# Patient Record
Sex: Male | Born: 1940 | Race: White | Hispanic: No | Marital: Married | Smoking: Former smoker
Health system: Southern US, Community
[De-identification: ages and names within clinical notes are randomized; demographics above are authoritative.]

## PROBLEM LIST (undated history)

## (undated) ENCOUNTER — Emergency Department (HOSPITAL_COMMUNITY): Admission: EM | Payer: Medicare Other

## (undated) DIAGNOSIS — T8859XA Other complications of anesthesia, initial encounter: Secondary | ICD-10-CM

## (undated) DIAGNOSIS — A4101 Sepsis due to Methicillin susceptible Staphylococcus aureus: Secondary | ICD-10-CM

## (undated) DIAGNOSIS — N35919 Unspecified urethral stricture, male, unspecified site: Secondary | ICD-10-CM

## (undated) DIAGNOSIS — G252 Other specified forms of tremor: Secondary | ICD-10-CM

## (undated) DIAGNOSIS — I480 Paroxysmal atrial fibrillation: Secondary | ICD-10-CM

## (undated) DIAGNOSIS — E785 Hyperlipidemia, unspecified: Secondary | ICD-10-CM

## (undated) DIAGNOSIS — G8220 Paraplegia, unspecified: Secondary | ICD-10-CM

## (undated) DIAGNOSIS — M545 Low back pain, unspecified: Secondary | ICD-10-CM

## (undated) DIAGNOSIS — K2100 Gastro-esophageal reflux disease with esophagitis, without bleeding: Secondary | ICD-10-CM

## (undated) DIAGNOSIS — E559 Vitamin D deficiency, unspecified: Secondary | ICD-10-CM

## (undated) DIAGNOSIS — B029 Zoster without complications: Secondary | ICD-10-CM

## (undated) DIAGNOSIS — G4700 Insomnia, unspecified: Secondary | ICD-10-CM

## (undated) DIAGNOSIS — R918 Other nonspecific abnormal finding of lung field: Secondary | ICD-10-CM

## (undated) DIAGNOSIS — Z8719 Personal history of other diseases of the digestive system: Secondary | ICD-10-CM

## (undated) DIAGNOSIS — I639 Cerebral infarction, unspecified: Secondary | ICD-10-CM

## (undated) DIAGNOSIS — G459 Transient cerebral ischemic attack, unspecified: Secondary | ICD-10-CM

## (undated) DIAGNOSIS — J189 Pneumonia, unspecified organism: Secondary | ICD-10-CM

## (undated) DIAGNOSIS — G250 Essential tremor: Secondary | ICD-10-CM

## (undated) DIAGNOSIS — T4145XA Adverse effect of unspecified anesthetic, initial encounter: Secondary | ICD-10-CM

## (undated) DIAGNOSIS — E119 Type 2 diabetes mellitus without complications: Secondary | ICD-10-CM

## (undated) DIAGNOSIS — N138 Other obstructive and reflux uropathy: Secondary | ICD-10-CM

## (undated) DIAGNOSIS — R350 Frequency of micturition: Secondary | ICD-10-CM

## (undated) DIAGNOSIS — I739 Peripheral vascular disease, unspecified: Secondary | ICD-10-CM

## (undated) DIAGNOSIS — I779 Disorder of arteries and arterioles, unspecified: Secondary | ICD-10-CM

## (undated) DIAGNOSIS — I10 Essential (primary) hypertension: Secondary | ICD-10-CM

## (undated) DIAGNOSIS — R001 Bradycardia, unspecified: Secondary | ICD-10-CM

## (undated) DIAGNOSIS — N401 Enlarged prostate with lower urinary tract symptoms: Secondary | ICD-10-CM

## (undated) DIAGNOSIS — F411 Generalized anxiety disorder: Secondary | ICD-10-CM

## (undated) DIAGNOSIS — J302 Other seasonal allergic rhinitis: Secondary | ICD-10-CM

## (undated) HISTORY — DX: Generalized anxiety disorder: F41.1

## (undated) HISTORY — DX: Type 2 diabetes mellitus without complications: E11.9

## (undated) HISTORY — DX: Unspecified urethral stricture, male, unspecified site: N35.919

## (undated) HISTORY — DX: Insomnia, unspecified: G47.00

## (undated) HISTORY — DX: Vitamin D deficiency, unspecified: E55.9

## (undated) HISTORY — DX: Essential tremor: G25.0

## (undated) HISTORY — DX: Cerebral infarction, unspecified: I63.9

## (undated) HISTORY — DX: Other specified forms of tremor: G25.2

## (undated) HISTORY — PX: SPINAL FUSION: SHX223

## (undated) HISTORY — DX: Hyperlipidemia, unspecified: E78.5

## (undated) HISTORY — DX: Frequency of micturition: R35.0

## (undated) HISTORY — DX: Gastro-esophageal reflux disease with esophagitis, without bleeding: K21.00

## (undated) HISTORY — PX: OTHER SURGICAL HISTORY: SHX169

## (undated) HISTORY — DX: Zoster without complications: B02.9

## (undated) HISTORY — DX: Low back pain: M54.5

## (undated) HISTORY — DX: Benign prostatic hyperplasia with lower urinary tract symptoms: N40.1

## (undated) HISTORY — DX: Other obstructive and reflux uropathy: N13.8

## (undated) HISTORY — DX: Low back pain, unspecified: M54.50

## (undated) HISTORY — DX: Gastro-esophageal reflux disease with esophagitis: K21.0

## (undated) SURGERY — ECHOCARDIOGRAM, TRANSESOPHAGEAL
Anesthesia: Moderate Sedation

---

## 1984-03-02 HISTORY — PX: CARDIAC CATHETERIZATION: SHX172

## 1992-03-02 HISTORY — PX: OTHER SURGICAL HISTORY: SHX169

## 2007-05-24 ENCOUNTER — Emergency Department (HOSPITAL_COMMUNITY): Admission: EM | Admit: 2007-05-24 | Discharge: 2007-05-25 | Payer: Self-pay | Admitting: Emergency Medicine

## 2007-06-01 ENCOUNTER — Ambulatory Visit (HOSPITAL_BASED_OUTPATIENT_CLINIC_OR_DEPARTMENT_OTHER): Admission: RE | Admit: 2007-06-01 | Discharge: 2007-06-01 | Payer: Self-pay | Admitting: Urology

## 2007-07-01 HISTORY — PX: OTHER SURGICAL HISTORY: SHX169

## 2009-02-06 ENCOUNTER — Ambulatory Visit (HOSPITAL_BASED_OUTPATIENT_CLINIC_OR_DEPARTMENT_OTHER): Admission: RE | Admit: 2009-02-06 | Discharge: 2009-02-06 | Payer: Self-pay | Admitting: Urology

## 2009-04-30 ENCOUNTER — Emergency Department (HOSPITAL_COMMUNITY): Admission: EM | Admit: 2009-04-30 | Discharge: 2009-04-30 | Payer: Self-pay | Admitting: Emergency Medicine

## 2010-06-03 LAB — POCT HEMOGLOBIN-HEMACUE: Hemoglobin: 15.7 g/dL (ref 13.0–17.0)

## 2010-07-15 NOTE — Consult Note (Signed)
NAME:  Marcus [name], SEK NO.:  [id number]   MEDICAL RECORD NO.:  [id number]          PATIENT TYPE:  EMS   LOCATION:  ED                           FACILITY:  [facility]   PHYSICIAN:  Valetta Fuller, M.D.  DATE OF BIRTH:  10/14/1940   DATE OF CONSULTATION:  05/25/2007  DATE OF DISCHARGE:                                 CONSULTATION   REASON FOR CONSULTATION:  Urinary retention and inability to place Foley  catheter.   HISTORY OF PRESENT ILLNESS:  Marcus [name] is a 70 year old male.  He  really has no significant urologic history and generally has had good  health.  His primary medical problem has been very severe and chronic  back problems which necessitated a multitude of surgeries.  During those  procedures, he has had Foley catheters placed.  The patient tells me  that he really has not had any appreciable voiding difficulties.  He  typically has nocturia of two times per evening.  He reports a  relatively good stream without dysuria or hematuria.  He has had no  other substantial voiding complaints.  Today he began noticing that when  he tried to urinate, it was very difficult with a very weak stream and  was only able to get small volumes out.  This persisted for several  hours and eventually he was unable to void at all.  He presented to the  [facility] Emergency Room.  He was complaining of suprapubic  pressure and inability to void, but otherwise was clinically doing just  fine.  Nursing staff and techs in the ER attempted on several occasions  to place a variety of catheters without success.  For that reason we  were consulted.   PAST MEDICAL HISTORY:  Again notable primarily for chronic  musculoskeletal back problems.  No other systemic diseases.   MEDICATIONS:  The patient's only medication is some Xanax for anxiety.   ALLERGIES/INTOLERANCES:  He has an allergy/intolerance to DEMEROL.   The patient is a nonsmoker.   SURGICAL HISTORY:  Notable for  a multitude of complicated back  procedures.   REVIEW OF SYSTEMS:  The patient's review of systems is positive for  inability to void, negative for any gross hematuria, dysuria, fever,  chills, flank discomfort.  Positive for suprapubic pressure and  discomfort.  Negative for any chest pain, shortness of breath, vision  problems, ENT problems, testicular pain, extremity swelling.   PHYSICAL EXAMINATION:  GENERAL APPEARANCE:  The patient is a well-  developed, well-nourished male.  He was in moderate distress.  He was  awake, alert and oriented x3.  VITAL SIGNS:  He was afebrile with a blood pressure of 106/74, pulse of  83 and a respiratory rate of 20.  NECK:  No masses or JVD.  CHEST:  Normal effort.  ABDOMEN:  Soft and nontender.  The patient did have large well-healed  midline incision without evidence of ventral hernia.  No splenomegaly.  No obvious masses.  GU:  Penis was circumcised without lesions.  The patient did have some  blood in his urethral  meatus.  Scrotum, testes adnexal structures  unremarkable.  No obvious perineal abnormalities.  Normal rectal tone,  1+ prostate.  EXTREMITIES:  Without edema.   PROCEDURE:  Attempt was made to place an 18-French coude catheter.  Resistance was felt near the area of the proximal penile urethra/bulbar  urethra.  Catheter was then removed.  Flexible cystoscopy was performed.  The patient's initial anterior urethra was unremarkable.  When we got to  the area of obstruction, there was clearly evidence of a false passage  from previous catheter attempts.  I was unable to locate a lumen.  I  spent approximately 30 minutes with the flexible cystoscope attempting  to manipulate my way through this area to try to find the opening.  Eventually, we were successful in bypassing this area.  Along the way, I  encountered two calculi that appeared to be lodged within the urethra  and somewhat entwining within this material from the false passage.   I  was able to guide myself into the bladder and then through the flexible  cystoscope I placed a guidewire.  The flexible cystoscope was then  removed.  We did use a dilator x1 of 16-French and found that that went  in easily.  I then placed a Council-tip Foley catheter over the  guidewire and was able to drain 1000 mL of relatively clear urine from  the patient's bladder.  He had marked improvement.  Of note, we did have  to irrigate his bladder before drainage and one small clot was obtained  but again the urine otherwise remained clear.   ASSESSMENT:  Urinary retention.  I suspect he may have had some degree  of urethral stricturing from prior catheterizations.  He probably has  had some bladder calculi that subsequently came into the urethra and  became lodged occluding him and resulted in the urinary retention.  The  catheter attempts resulted in further trauma to the urethra with some  false passages.  The patient will need to have an indwelling catheter  for 10-14 days and probable outpatient surgery to further assess his  urethra and potentially removed any calculi the may still be residing  within the urethra and/or bladder.  We will cover him with Cipro and  also some pain medication.  Foley will be placed to leg bag drainage.           ______________________________  Valetta Fuller, M.D.  Electronically Signed     DSG/MEDQ  D:  05/25/2007  T:  05/25/2007  Job:  [id number]

## 2010-07-15 NOTE — Op Note (Signed)
NAME:  Marcus [name], Marcus [name] NO.:  [id number]   MEDICAL RECORD NO.:  [id number]          PATIENT TYPE:  AMB   LOCATION:  [facility]                         FACILITY:  [facility]   PHYSICIAN:  Valetta Fuller, M.D.  DATE OF BIRTH:  1940/12/10   DATE OF PROCEDURE:  DATE OF DISCHARGE:                               OPERATIVE REPORT   PREOPERATIVE DIAGNOSES:  1. Urethral false passage.  2. Urinary retention.   POSTOPERATIVE DIAGNOSES:  1. Urethral false passage.  2. Urinary retention.   PROCEDURES:  1. Cystourethroscopy.  2. Removal of bladder stones.   ATTENDING PHYSICIAN:  Valetta Fuller, M.D.   RESIDENT PHYSICIAN:  Maudie Flakes, M.D.   ANESTHESIA:  General.   INDICATIONS FOR PROCEDURE:  Marcus [name] is a 70 year old white male who  Dr. Isabel Caprice saw in the emergency department in consultation for urinary  retention and difficult Foley placement.  He required flexible  urethroscopy and at that time multiple traumatic urethral false passages  were noted, as were some urethral stones.  Dr. Isabel Caprice was eventually  able to find the true lumen, cannulate it with the guidewire and place a  catheter over the wire.  He is being brought back to the operating room  today for further evaluation of his urethra.  Informed consent was  reviewed with Mr. Santa, and this did include ureteral dilation versus  DVIU and possible need for postoperative catheter.   PROCEDURE IN DETAIL:  The patient was brought to the operating room and  placed in supine position.  He was correctly identified by his wristband  and an appropriate time-out was taken.  IV antibiotics were  administered.  General anesthesia was delivered.  Once adequately  anesthetized,  he was placed in dorsal lithotomy position with great  care taken to minimize the risk of peripheral neuropathy or compartment  syndrome.  His perineum was prepped and draped sterilely.  We began our  procedure by retracting his foreskin, which is  fairly phimotic.  We then  passed a 22-French rigid cystoscopic sheath with a 12-degree lens and  sterile water as our irrigant.  He had a large false passage at the 12  o'clock position in the bulbar urethra.  This was easily traversed with  the cystoscope.  His prostatic urethra showed bilobar prostatic  hyperplasia and a high median bar.  Upon entering the bladder, clear  urine was identified.  He had some erythematous and edematous changes to  the back wall of his bladder consistent with where his Foley catheter  rested.  There may be evidence of a small bladder diverticulum in this  area as well.  Both ureteral orifices were noted to be in their normal  anatomic position effluxing clear urine.  He did have multiple small  stones in his bladder that were irrigated away and collected.  The rest  of his bladder was normal in appearance.  We then backed the cystoscope  back into the prostatic urethra and multiple small stones were noted to  be in the 12 o'clock position and with gentle manipulation of the  cystoscope,  they were able to be milked out of the prostate.  These were  also removed through the cystoscope sheath under direct vision.  As we  slowly removed the scope in an antegrade fashion, we were able to  appreciate this false passage, which was a widely patent in an antegrade  flow.  There were no strictures noted and subsequently the cystoscope  was removed.  He did have some postoperative bleeding per urethra which  required re-cystoscoping him, and no significant clots were seen.  No  new urethral injury was appreciated, however.  In light of this, we  advanced a sensor-tip guidewire through the cystoscope into his bladder  and placed a 16-French council-tip catheter over this catheter, inflated  with 10 mL of sterile water, and put him to traction, believing that all  of the bleeding is coming from either his prostatic urethra or his  bulbar urethra from our urethroscopy.   Assuming his urine clears, this  will most likely be removed in the recovery room.  This marked the end  of our procedure.  He tolerated the procedure well.  There were no  complications.  Dr. Isabel Caprice was present and participated in all aspects  of the case.  Anesthesia was reversed.  He was awoken and was taken to  the PACU in stable condition.     ______________________________  Maudie Flakes, MD      Valetta Fuller, M.D.  Electronically Signed    DW/MEDQ  D:  06/01/2007  T:  06/01/2007  Job:  [id number]

## 2010-10-20 ENCOUNTER — Ambulatory Visit (HOSPITAL_BASED_OUTPATIENT_CLINIC_OR_DEPARTMENT_OTHER)
Admission: RE | Admit: 2010-10-20 | Discharge: 2010-10-20 | Disposition: A | Discharge status: Home / Self Care | Payer: Medicare Other | Source: Ambulatory Visit | Attending: Urology | Admitting: Urology

## 2010-10-20 DIAGNOSIS — Z01812 Encounter for preprocedural laboratory examination: Secondary | ICD-10-CM | POA: Insufficient documentation

## 2010-10-20 DIAGNOSIS — N35919 Unspecified urethral stricture, male, unspecified site: Secondary | ICD-10-CM | POA: Insufficient documentation

## 2010-10-20 DIAGNOSIS — R339 Retention of urine, unspecified: Secondary | ICD-10-CM | POA: Insufficient documentation

## 2010-10-20 DIAGNOSIS — Z01810 Encounter for preprocedural cardiovascular examination: Secondary | ICD-10-CM | POA: Insufficient documentation

## 2011-03-03 HISTORY — PX: EYE SURGERY: SHX253

## 2012-03-02 HISTORY — PX: EYE SURGERY: SHX253

## 2012-07-18 ENCOUNTER — Other Ambulatory Visit: Payer: Self-pay | Admitting: *Deleted

## 2012-07-18 ENCOUNTER — Other Ambulatory Visit: Payer: Medicare Other

## 2012-07-18 DIAGNOSIS — I10 Essential (primary) hypertension: Secondary | ICD-10-CM

## 2012-07-18 DIAGNOSIS — E559 Vitamin D deficiency, unspecified: Secondary | ICD-10-CM

## 2012-07-18 DIAGNOSIS — E785 Hyperlipidemia, unspecified: Secondary | ICD-10-CM

## 2012-07-18 DIAGNOSIS — IMO0001 Reserved for inherently not codable concepts without codable children: Secondary | ICD-10-CM

## 2012-07-19 ENCOUNTER — Encounter: Payer: Self-pay | Admitting: Geriatric Medicine

## 2012-07-19 LAB — COMPREHENSIVE METABOLIC PANEL
ALT: 28 IU/L (ref 0–44)
AST: 22 IU/L (ref 0–40)
Albumin/Globulin Ratio: 1.7 (ref 1.1–2.5)
BUN/Creatinine Ratio: 16 (ref 10–22)
GFR calc Af Amer: 86 mL/min/{1.73_m2} (ref >59)
GFR calc non Af Amer: 75 mL/min/{1.73_m2} (ref >59)
Potassium: 4.6 mmol/L (ref 3.5–5.2)
Sodium: 139 mmol/L (ref 134–144)
Total Bilirubin: 0.3 mg/dL (ref 0.0–1.2)

## 2012-07-19 LAB — LIPID PANEL: Chol/HDL Ratio: 5.5 ratio units — ABNORMAL HIGH (ref 0.0–5.0)

## 2012-07-19 LAB — HEMOGLOBIN A1C
Est. average glucose Bld gHb Est-mCnc: 123 mg/dL
Hgb A1c MFr Bld: 5.9 % — ABNORMAL HIGH (ref 4.8–5.6)

## 2012-07-19 LAB — PSA: PSA: 0.7 ng/mL (ref 0.0–4.0)

## 2012-07-20 ENCOUNTER — Encounter: Payer: Self-pay | Admitting: Internal Medicine

## 2012-07-20 ENCOUNTER — Ambulatory Visit (INDEPENDENT_AMBULATORY_CARE_PROVIDER_SITE_OTHER): Payer: Medicare Other | Admitting: Internal Medicine

## 2012-07-20 VITALS — BP 126/84 | HR 58 | Temp 97.9°F | Resp 14 | Ht 67.0 in | Wt 202.8 lb

## 2012-07-20 DIAGNOSIS — E559 Vitamin D deficiency, unspecified: Secondary | ICD-10-CM | POA: Insufficient documentation

## 2012-07-20 DIAGNOSIS — R748 Abnormal levels of other serum enzymes: Secondary | ICD-10-CM

## 2012-07-20 DIAGNOSIS — E785 Hyperlipidemia, unspecified: Secondary | ICD-10-CM

## 2012-07-20 DIAGNOSIS — M545 Low back pain: Secondary | ICD-10-CM

## 2012-07-20 DIAGNOSIS — E119 Type 2 diabetes mellitus without complications: Secondary | ICD-10-CM

## 2012-07-20 DIAGNOSIS — G250 Essential tremor: Secondary | ICD-10-CM

## 2012-07-20 NOTE — Patient Instructions (Signed)
Continue current medications. 

## 2012-07-20 NOTE — Progress Notes (Signed)
Date: 07/20/2012  MRN:  [id number] Name:  Marcus [name] Sex:  male Age:  72 y.o. DOB:10-14-1940   [facility] #:   [id number]                    Provider: Murray Hodgkins,, MD  Emergency Contacts: Contact Information   Name Relation Home Work Mobile   [place] Spouse [phone]        Code Status: full  Allergies: Allergies  Allergen Reactions  . Demerol (Meperidine)      Chief Complaint  Patient presents with  . Annual Exam    Complains of cough and no energy     HPI:  Type II or unspecified type diabetes mellitus without mention of complication, not stated as uncontrolled: mild elevations in glucose. Diet controlled.  Lumbago: chronic low back pains. Unchanged.  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  Unspecified vitamin D deficiency: not using Vit D regularly  Elevated alkaline phosphatase level: new finding on recent lab. No associated GI complaints.   Glaucoma; continues under treatment with ophth.  Fatigue: working 50 hours a week at car [employer]. Exhausted when he gets home.    Past Medical History  Diagnosis Date  . Unspecified vitamin D deficiency   . Urethral stricture unspecified   . Hypertrophy of prostate with urinary obstruction and other lower urinary tract symptoms (LUTS)   . Type II or unspecified type diabetes mellitus without mention of complication, not stated as uncontrolled   . Anxiety state, unspecified   . 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 Urinary frequency   . Insomnia, unspecified   . Unspecified glaucoma(365.9)   . 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 Calculus of kidney   . Other and unspecified hyperlipidemia   . Reflux esophagitis   . Lumbago   . Unspecified essential hypertension   . Herpes zoster without mention of complication     Past Surgical History  Procedure Laterality Date  . Spinal fusion  12/91,11/91   obtained bone from left lower leg  . Eye surgery  2014    cataract  . Eye surgery  2013    Retina     Procedures: 1986-Cardiac Cath.:Normal-Dr.Stuckey 1994-Stress thallium:Normal 1995-CT GEX:BMWUXL 1997-IVP:Normal x-rays but indicated blood clots in strainer after voiding 2001-2D Echo:Normal 2008-Back x-ray 07/2007- Stretching bladder- Dr. Isabel Caprice 08/2007- Eye exam   Consultants: Ortho.-Dr.Supple and Dr.Nitka Cardiologist-Dr.Stuckey Neurology-Dr.Love Urologist-Dr.Humphries Urologist- Dr. Hubbard Robinson- Dr. Eudelia Bunch. Harlon Flor   Current Outpatient Prescriptions  Medication Sig Dispense Refill  . Cholecalciferol (VITAMIN D) 2000 UNITS tablet Take 2,000 Units by mouth daily.      Marland Kitchen LORazepam (ATIVAN) 0.5 MG tablet Take 0.5 mg by mouth every 8 (eight) hours. For blood pressure.       No current facility-administered medications for this visit.  also using drops for glaucoma, but he does not know what they are.   Immunization History  Administered Date(s) Administered  . DTaP 04/30/2009  . Influenza-Generic 01/02/2009, 12/01/2011     Diet: regular  History  Substance Use Topics  . Smoking status: Former Games developer  . Smokeless tobacco: Not on file  . Alcohol Use: No    Family History  Problem Relation Age of Onset  . Diabetes Mother   . Cancer Mother   . Hypertension Father   . Stroke Father   . Alcohol abuse Brother      Constitutional: positive for  fatigue Eyes: positive for glaucoma Ears, nose, mouth, throat, and face: positive for missing teeth and poor dentition Respiratory: negative Cardiovascular: negative Gastrointestinal: negative Genitourinary:positive for frequency Integument/breast: negative Hematologic/lymphatic: negative Musculoskeletal:positive for back pain Neurological: negative Behavioral/Psych: some trouble sleeping. History of early morning awakening. Endocrine: history of mild diabetes. Allergic/Immunologic: negative  Vital signs: BP  126/84  Pulse 58  Temp(Src) 97.9 F (36.6 C) (Oral)  Resp 14  Ht 5\' 7"  (1.702 m)  Wt 202 lb 12.8 oz (91.989 kg)  BMI 31.76 kg/m2  BP 126/84  Pulse 58  Temp(Src) 97.9 F (36.6 C) (Oral)  Resp 14  Ht 5\' 7"  (1.702 m)  Wt 202 lb 12.8 oz (91.989 kg)  BMI 31.76 kg/m2  General Appearance:    Alert, cooperative, no distress, appears stated age  Head:    Normocephalic, without obvious abnormality, atraumatic  Eyes:    PERRL, conjunctiva/corneas clear, EOM's intact, fundi    benign, both eyes       Ears:    Normal TM's and external ear canals, both ears  Nose:   Nares normal, septum midline, mucosa normal, no drainage   or sinus tenderness  Throat:   Lips, mucosa, and tongue normal; gums normal. Missing teeth.  Neck:   Supple, symmetrical, trachea midline, no adenopathy;       thyroid:  No enlargement/tenderness/nodules; no carotid   bruit or JVD  Back:     Symmetric, no curvature, ROM normal, no CVA tenderness  Lungs:     Clear to auscultation bilaterally, respirations unlabored  Chest wall:    No tenderness or deformity  Heart:    Regular rate and rhythm, S1 and S2 normal, no murmur, rub   or gallop  Abdomen:     Soft, non-tender, bowel sounds active all four quadrants,    no masses, no organomegaly  Genitalia:    Normal male without lesion, discharge or tenderness  Rectal:    Normal tone, normal prostate, no masses or tenderness;   guaiac negative stool  Extremities:   Extremities normal, atraumatic, no cyanosis or edema. Scar  In lumbar area from prior surgery.  Pulses:   2+ and symmetric all extremities  Skin:   Skin color, texture, turgor normal, no rashes or lesions. Balding.  Lymph nodes:   Cervical, supraclavicular, and axillary nodes normal  Neurologic:   CNII-XII intact. Normal strength, sensation. Absent DTR at patella bilat.      Screening Score  MMS    PHQ2 0  PHQ9     Fall Risk    BIMS    Lab reports 06/30/2010 CBC Wbc 3.2 CMP: glucose 99, BUN 26,  Creatinine 1.00 Lipid: cholesterol 173, triglycerides 72, HDL 32, LDL 127 PSA 0.8 TSH 1.950 Urine 2+ Wbc  Vitamin D 36.9 06/30/2011 CBC Wbc 3.8 Rbc 4.75 Hemoglobin 14.9  CMP Glucose 104 Bun 20 Creatine 0.95  Lipid Panel Cholesterol 199 Triglycerides 72 Hdl 36 Ldl 149  Vitamin D 25 Hydroxy 27.4  07/15/11 EKG: rate 59. NSR. Normal Appointment on 07/18/2012  Component Date Value Range Status  . Glucose 07/18/2012 108* 65 - 99 mg/dL Final  . BUN 16/12/9602 16  8 - 27 mg/dL Final  . Creatinine, Ser 07/18/2012 1.02  0.76 - 1.27 mg/dL Final  . GFR calc non Af Amer 07/18/2012 75  >59 mL/min/1.73 Final  . GFR calc Af Amer 07/18/2012 86  >59 mL/min/1.73 Final  . BUN/Creatinine Ratio 07/18/2012 16  10 - 22 Final  . Sodium 07/18/2012 139  134 - 144 mmol/L Final  . Potassium 07/18/2012 4.6  3.5 - 5.2 mmol/L Final  . Chloride 07/18/2012 102  97 - 108 mmol/L Final  . CO2 07/18/2012 25  19 - 28 mmol/L Final  . Calcium 07/18/2012 9.1  8.6 - 10.2 mg/dL Final  . Total Protein 07/18/2012 6.4  6.0 - 8.5 g/dL Final  . Albumin 14/78/2956 4.0  3.6 - 4.8 g/dL Final  . Globulin, Total 07/18/2012 2.4  1.5 - 4.5 g/dL Final  . Albumin/Globulin Ratio 07/18/2012 1.7  1.1 - 2.5 Final  . Total Bilirubin 07/18/2012 0.3  0.0 - 1.2 mg/dL Final  . Alkaline Phosphatase 07/18/2012 130* 39 - 117 IU/L Final  . AST 07/18/2012 22  0 - 40 IU/L Final  . ALT 07/18/2012 28  0 - 44 IU/L Final  . Cholesterol, Total 07/18/2012 176  100 - 199 mg/dL Final  . Triglycerides 07/18/2012 103  0 - 149 mg/dL Final  . HDL 21/30/8657 32* >39 mg/dL Final   Comment: According to ATP-III Guidelines, HDL-C >59 mg/dL is considered a                          negative risk factor for CHD.  Marland Kitchen VLDL Cholesterol Cal 07/18/2012 21  5 - 40 mg/dL Final  . LDL Calculated 07/18/2012 846* 0 - 99 mg/dL Final  . Chol/HDL Ratio 07/18/2012 5.5* 0.0 - 5.0 ratio units Final  . Hemoglobin A1C 07/18/2012 5.9* 4.8 - 5.6 % Final   Comment:          Increased risk  for diabetes: 5.7 - 6.4                                   Diabetes: >6.4                                   Glycemic control for adults with diabetes: <7.0  . Estimated average glucose 07/18/2012 123   Final  . Vit D, 25-Hydroxy 07/18/2012 27.6* 30.0 - 100.0 ng/mL Final   Comment: Vitamin D deficiency has been defined by the Institute of                          Medicine and an Endocrine Society practice guideline as a                          level of serum 25-OH vitamin D less than 20 ng/mL (1,2).                          The Endocrine Society went on to further define vitamin D                          insufficiency as a level between 21 and 29 ng/mL (2).                          1. IOM (Institute of Medicine). 2010. Dietary reference                             intakes for calcium and D. [place]  DC: The                             [employer].                          2. Holick MF, Binkley [place], Bischoff-Ferrari HA, et al.                             Evaluation, treatment, and prevention of vitamin D                             deficiency: an Endocrine Society clinical practice                             guideline. JCEM. 2011 Jul; 96(7):1911-30.  Marland Kitchen PSA 07/18/2012 0.7  0.0 - 4.0 ng/mL Final   Comment: Roche ECLIA methodology.                          According to the American Urological Association, Serum PSA should                          decrease and remain at undetectable levels after radical                          prostatectomy. The AUA defines biochemical recurrence as an initial                          PSA value 0.2 ng/mL or greater followed by a subsequent confirmatory                          PSA value 0.2 ng/mL or greater.                          Values obtained with different assay methods or kits cannot be used                          interchangeably. Results cannot be interpreted as absolute evidence                          of the presence or absence of  malignant disease.     Annual summary: Hospitalizations: none in the last year Infection History: bronchitis in march 2014 Functional assessment: independent in all ADL Areas of potential improvement: none Rehabilitation Potential: not pertinent Prognosis for survival: good  Plan: 1. Lumbago Chronic and unchanged  2. Other and unspecified hyperlipidemia Follow lab at future visit - Lipid Panel; Future  3. 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 Type II or unspecified type diabetes mellitus without mention of complication, not stated as uncontrolled Mild and diet controlled - CMP; Future - Hemoglobin A1c; Future  5. Unspecified vitamin D deficiency Take vitamin D as recommended  6. Elevated alkaline phosphatase level Followup lab next visit

## 2012-10-12 ENCOUNTER — Other Ambulatory Visit: Payer: Self-pay | Admitting: Geriatric Medicine

## 2012-10-12 MED ORDER — LORAZEPAM 0.5 MG PO TABS: 0.5000 mg | ORAL_TABLET | Freq: Three times a day (TID) | ORAL | Status: DC

## 2012-10-25 ENCOUNTER — Encounter: Payer: Self-pay | Admitting: *Deleted

## 2013-01-12 ENCOUNTER — Other Ambulatory Visit: Payer: Medicare Other

## 2013-01-12 DIAGNOSIS — E119 Type 2 diabetes mellitus without complications: Secondary | ICD-10-CM

## 2013-01-12 DIAGNOSIS — E785 Hyperlipidemia, unspecified: Secondary | ICD-10-CM

## 2013-01-13 LAB — COMPREHENSIVE METABOLIC PANEL
ALT: 17 IU/L (ref 0–44)
AST: 21 IU/L (ref 0–40)
Chloride: 105 mmol/L (ref 97–108)
GFR calc Af Amer: 72 mL/min/{1.73_m2} (ref >59)
Glucose: 111 mg/dL — ABNORMAL HIGH (ref 65–99)
Potassium: 4.9 mmol/L (ref 3.5–5.2)
Sodium: 143 mmol/L (ref 134–144)
Total Bilirubin: 0.3 mg/dL (ref 0.0–1.2)
Total Protein: 6.2 g/dL (ref 6.0–8.5)

## 2013-01-13 LAB — LIPID PANEL
Cholesterol, Total: 171 mg/dL (ref 100–199)
VLDL Cholesterol Cal: 14 mg/dL (ref 5–40)

## 2013-01-13 LAB — HEMOGLOBIN A1C
Est. average glucose Bld gHb Est-mCnc: 123 mg/dL
Hgb A1c MFr Bld: 5.9 % — ABNORMAL HIGH (ref 4.8–5.6)

## 2013-01-16 ENCOUNTER — Encounter: Payer: Self-pay | Admitting: *Deleted

## 2013-01-17 ENCOUNTER — Ambulatory Visit: Payer: Self-pay | Admitting: Internal Medicine

## 2013-02-01 ENCOUNTER — Encounter: Payer: Self-pay | Admitting: Internal Medicine

## 2013-02-01 ENCOUNTER — Ambulatory Visit (INDEPENDENT_AMBULATORY_CARE_PROVIDER_SITE_OTHER): Payer: Medicare Other | Admitting: Internal Medicine

## 2013-02-01 VITALS — BP 124/86 | HR 73 | Temp 98.1°F | Resp 16 | Wt 201.2 lb

## 2013-02-01 DIAGNOSIS — E119 Type 2 diabetes mellitus without complications: Secondary | ICD-10-CM

## 2013-02-01 DIAGNOSIS — E785 Hyperlipidemia, unspecified: Secondary | ICD-10-CM

## 2013-02-01 DIAGNOSIS — M545 Low back pain: Secondary | ICD-10-CM

## 2013-02-01 DIAGNOSIS — G250 Essential tremor: Secondary | ICD-10-CM

## 2013-02-01 DIAGNOSIS — Z23 Encounter for immunization: Secondary | ICD-10-CM

## 2013-02-01 NOTE — Progress Notes (Signed)
Subjective:    Patient ID: Marcus [name], male    DOB: Sep 07, 1940, 72 y.o.   MRN: [id number]  Chief Complaint  Patient presents with  . Medical Managment of Chronic Issues    6 month f/u   . Immunizations    pt wants to discuss getting the Pneumo and Zoster before agreeing.    HPI Type II or unspecified type diabetes mellitus without mention of complication, not stated as uncontrolled: most recent lab continues to show mild elevation in glucose  Other and unspecified hyperlipidemia: well controlled. Low HDL.  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 Cholecalciferol (VITAMIN D) 2000 UNITS tablet Take 2,000 Units by mouth daily.      Marland Kitchen LORazepam (ATIVAN) 0.5 MG tablet Take 1 tablet (0.5 mg total) by mouth every 8 (eight) hours. For blood pressure.  90 tablet  3   No current facility-administered medications on file prior to visit.    Review of Systems  Constitutional: Negative for fever, activity change, appetite change, fatigue and unexpected weight change.  Eyes:       Hx glaucoma  Respiratory: Negative.   Cardiovascular: Negative.  Negative for chest pain, palpitations and leg swelling.  Gastrointestinal: Positive for constipation. Negative for abdominal pain, diarrhea and abdominal distention.  Endocrine: Negative for polydipsia, polyphagia and polyuria.       History of mild elevations in glucose  Genitourinary: Positive for frequency.  Musculoskeletal: Positive for back pain.  Skin: Negative.   Allergic/Immunologic: Negative.   Neurological: Negative.   Hematological: Negative.     Psychiatric/Behavioral: Negative.        Objective:BP 124/86  Pulse 73  Temp(Src) 98.1 F (36.7 C) (Oral)  Resp 16  Wt 201 lb 3.2 oz (91.264 kg)  SpO2 95%    Physical Exam  Constitutional: He is oriented to person, place, and time. He appears well-developed and well-nourished. No distress.  HENT:  Head: Atraumatic.  Right Ear: External ear normal.  Left Ear: External ear normal.  Nose: Nose normal.  Mouth/Throat: No oropharyngeal exudate.  Eyes: Conjunctivae and EOM are normal. Pupils are equal, round, and reactive to light.  Neck: No JVD present. No tracheal deviation present. No thyromegaly present.  Cardiovascular: Normal rate, regular rhythm, normal heart sounds and intact distal pulses.  Exam reveals no gallop and no friction rub.   No murmur heard. Pulmonary/Chest: No respiratory distress. He has no wheezes. He has no rales.  Abdominal: He exhibits no distension and no mass. There is no tenderness.  Musculoskeletal: Normal range of motion. He exhibits no edema and no tenderness.  Lymphadenopathy:    He has no cervical adenopathy.  Neurological: He is alert and oriented to person, place, and time. He displays abnormal reflex. No cranial nerve deficit. Coordination normal.  Absent patellar deep tendon reflex bilaterally.  Skin: No rash noted. No erythema. No pallor.  Psychiatric: He has a normal mood and affect. His behavior is normal. Judgment and thought content normal.      Appointment on 01/12/2013  Component Date Value Range Status  . Glucose 01/12/2013 111* 65 - 99 mg/dL Final  . BUN 78/29/5621 26  8 - 27 mg/dL Final  . Creatinine,  Ser 01/12/2013 1.18  0.76 - 1.27 mg/dL Final  . GFR calc non Af Amer 01/12/2013 63  >59 mL/min/1.73 Final  . GFR calc Af Amer 01/12/2013 72  >59 mL/min/1.73 Final  . BUN/Creatinine Ratio 01/12/2013 22  10 - 22 Final  . Sodium 01/12/2013 143  134 - 144 mmol/L Final  . Potassium 01/12/2013 4.9  3.5 - 5.2 mmol/L Final  . Chloride  01/12/2013 105  97 - 108 mmol/L Final  . CO2 01/12/2013 26  18 - 29 mmol/L Final  . Calcium 01/12/2013 9.3  8.6 - 10.2 mg/dL Final  . Total Protein 01/12/2013 6.2  6.0 - 8.5 g/dL Final  . Albumin 45/40/9811 4.0  3.6 - 4.8 g/dL Final  . Globulin, Total 01/12/2013 2.2  1.5 - 4.5 g/dL Final  . Albumin/Globulin Ratio 01/12/2013 1.8  1.1 - 2.5 Final  . Total Bilirubin 01/12/2013 0.3  0.0 - 1.2 mg/dL Final  . Alkaline Phosphatase 01/12/2013 109  39 - 117 IU/L Final  . AST 01/12/2013 21  0 - 40 IU/L Final  . ALT 01/12/2013 17  0 - 44 IU/L Final  . Cholesterol, Total 01/12/2013 171  100 - 199 mg/dL Final  . Triglycerides 01/12/2013 68  0 - 149 mg/dL Final  . HDL 91/47/8295 33* >39 mg/dL Final   Comment: According to ATP-III Guidelines, HDL-C >59 mg/dL is considered a                          negative risk factor for CHD.  Marland Kitchen VLDL Cholesterol Cal 01/12/2013 14  5 - 40 mg/dL Final  . LDL Calculated 01/12/2013 621* 0 - 99 mg/dL Final  . Chol/HDL Ratio 01/12/2013 5.2* 0.0 - 5.0 ratio units Final   Comment:                                   T. Chol/HDL Ratio                                                                      Men  Women                                                        1/2 Avg.Risk  3.4    3.3                                                            Avg.Risk  5.0    4.4  2X Avg.Risk  9.6    7.1                                                         3X Avg.Risk 23.4   11.0  . Hemoglobin A1C 01/12/2013 5.9* 4.8 - 5.6 % Final   Comment:          Increased risk for diabetes: 5.7 - 6.4                                   Diabetes: >6.4                                   Glycemic control for adults with diabetes: <7.0  . Estimated average glucose 01/12/2013 123   Final       Assessment & Plan:  Type II or unspecified type diabetes mellitus without mention of complication, not stated as uncontrolled: Mild elevations in glucose  persists. Dietary control alone at this time.  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

## 2013-03-04 NOTE — Patient Instructions (Signed)
Continue current medications. We recommend pneumonia vaccine. Because of documented shingles in the past, I do not recommend herpes zoster vaccine.

## 2013-03-22 ENCOUNTER — Encounter: Payer: Self-pay | Admitting: Internal Medicine

## 2013-04-30 ENCOUNTER — Other Ambulatory Visit: Payer: Self-pay | Admitting: Nurse Practitioner

## 2013-07-07 ENCOUNTER — Emergency Department (INDEPENDENT_AMBULATORY_CARE_PROVIDER_SITE_OTHER): Payer: Medicare Other

## 2013-07-07 ENCOUNTER — Emergency Department (HOSPITAL_COMMUNITY)
Admission: EM | Admit: 2013-07-07 | Discharge: 2013-07-07 | Disposition: A | Discharge status: Home / Self Care | Payer: Medicare Other | Attending: Emergency Medicine | Admitting: Emergency Medicine

## 2013-07-07 ENCOUNTER — Emergency Department (HOSPITAL_COMMUNITY): Payer: Medicare Other

## 2013-07-07 ENCOUNTER — Emergency Department (HOSPITAL_COMMUNITY)
Admission: EM | Admit: 2013-07-07 | Discharge: 2013-07-07 | Disposition: A | Discharge status: Home / Self Care | Payer: Medicare Other | Source: Home / Self Care | Attending: Family Medicine | Admitting: Family Medicine

## 2013-07-07 ENCOUNTER — Encounter (HOSPITAL_COMMUNITY): Payer: Self-pay | Admitting: Emergency Medicine

## 2013-07-07 DIAGNOSIS — I10 Essential (primary) hypertension: Secondary | ICD-10-CM | POA: Insufficient documentation

## 2013-07-07 DIAGNOSIS — Z87442 Personal history of urinary calculi: Secondary | ICD-10-CM | POA: Insufficient documentation

## 2013-07-07 DIAGNOSIS — J180 Bronchopneumonia, unspecified organism: Secondary | ICD-10-CM

## 2013-07-07 DIAGNOSIS — Z8669 Personal history of other diseases of the nervous system and sense organs: Secondary | ICD-10-CM | POA: Insufficient documentation

## 2013-07-07 DIAGNOSIS — R0602 Shortness of breath: Secondary | ICD-10-CM

## 2013-07-07 DIAGNOSIS — Z8619 Personal history of other infectious and parasitic diseases: Secondary | ICD-10-CM | POA: Insufficient documentation

## 2013-07-07 DIAGNOSIS — R0902 Hypoxemia: Secondary | ICD-10-CM

## 2013-07-07 DIAGNOSIS — E119 Type 2 diabetes mellitus without complications: Secondary | ICD-10-CM | POA: Insufficient documentation

## 2013-07-07 DIAGNOSIS — R791 Abnormal coagulation profile: Secondary | ICD-10-CM

## 2013-07-07 DIAGNOSIS — F411 Generalized anxiety disorder: Secondary | ICD-10-CM | POA: Insufficient documentation

## 2013-07-07 DIAGNOSIS — Z87448 Personal history of other diseases of urinary system: Secondary | ICD-10-CM | POA: Insufficient documentation

## 2013-07-07 DIAGNOSIS — Z87891 Personal history of nicotine dependence: Secondary | ICD-10-CM | POA: Insufficient documentation

## 2013-07-07 DIAGNOSIS — Z9889 Other specified postprocedural states: Secondary | ICD-10-CM | POA: Insufficient documentation

## 2013-07-07 DIAGNOSIS — Z8719 Personal history of other diseases of the digestive system: Secondary | ICD-10-CM | POA: Insufficient documentation

## 2013-07-07 DIAGNOSIS — R7989 Other specified abnormal findings of blood chemistry: Secondary | ICD-10-CM

## 2013-07-07 DIAGNOSIS — Z79899 Other long term (current) drug therapy: Secondary | ICD-10-CM | POA: Insufficient documentation

## 2013-07-07 LAB — CBC WITH DIFFERENTIAL/PLATELET
BASOS ABS: 0 10*3/uL (ref 0.0–0.1)
BASOS PCT: 0 % (ref 0–1)
EOS ABS: 0.1 10*3/uL (ref 0.0–0.7)
EOS PCT: 2 % (ref 0–5)
HCT: 46.5 % (ref 39.0–52.0)
Hemoglobin: 15.6 g/dL (ref 13.0–17.0)
Lymphocytes Relative: 15 % (ref 12–46)
Lymphs Abs: 0.9 10*3/uL (ref 0.7–4.0)
MCH: 32 pg (ref 26.0–34.0)
MCHC: 33.5 g/dL (ref 30.0–36.0)
MCV: 95.5 fL (ref 78.0–100.0)
Monocytes Absolute: 0.7 10*3/uL (ref 0.1–1.0)
Monocytes Relative: 12 % (ref 3–12)
Neutro Abs: 4.4 10*3/uL (ref 1.7–7.7)
Neutrophils Relative %: 71 % (ref 43–77)
PLATELETS: 147 10*3/uL — AB (ref 150–400)
RBC: 4.87 MIL/uL (ref 4.22–5.81)
RDW: 13.4 % (ref 11.5–15.5)
WBC: 6.2 10*3/uL (ref 4.0–10.5)

## 2013-07-07 LAB — POCT I-STAT, CHEM 8
BUN: 14 mg/dL (ref 6–23)
Calcium, Ion: 1.17 mmol/L (ref 1.13–1.30)
Chloride: 99 mEq/L (ref 96–112)
Creatinine, Ser: 1.1 mg/dL (ref 0.50–1.35)
GLUCOSE: 104 mg/dL — AB (ref 70–99)
HCT: 51 % (ref 39.0–52.0)
Hemoglobin: 17.3 g/dL — ABNORMAL HIGH (ref 13.0–17.0)
Potassium: 4.3 mEq/L (ref 3.7–5.3)
Sodium: 139 mEq/L (ref 137–147)
TCO2: 29 mmol/L (ref 0–100)

## 2013-07-07 LAB — I-STAT TROPONIN, ED: TROPONIN I, POC: 0 ng/mL (ref 0.00–0.08)

## 2013-07-07 LAB — D-DIMER, QUANTITATIVE (NOT AT ARMC): D DIMER QUANT: 0.6 ug{FEU}/mL — AB (ref 0.00–0.48)

## 2013-07-07 LAB — PRO B NATRIURETIC PEPTIDE: Pro B Natriuretic peptide (BNP): 442.6 pg/mL — ABNORMAL HIGH (ref 0–125)

## 2013-07-07 MED ORDER — LEVOFLOXACIN 500 MG PO TABS: 500.0000 mg | ORAL_TABLET | Freq: Every day | ORAL | Status: DC

## 2013-07-07 MED ORDER — ALBUTEROL SULFATE HFA 108 (90 BASE) MCG/ACT IN AERS
2.0000 | INHALATION_SPRAY | Freq: Once | RESPIRATORY_TRACT | Status: AC
  Administered 2013-07-07: 2 via RESPIRATORY_TRACT
  Filled 2013-07-07: qty 6.7

## 2013-07-07 MED ORDER — IPRATROPIUM BROMIDE 0.02 % IN SOLN
RESPIRATORY_TRACT | Status: AC
  Filled 2013-07-07: qty 2.5

## 2013-07-07 MED ORDER — PREDNISONE 20 MG PO TABS: 40.0000 mg | ORAL_TABLET | Freq: Every day | ORAL | Status: DC

## 2013-07-07 MED ORDER — IOHEXOL 350 MG/ML SOLN
80.0000 mL | Freq: Once | INTRAVENOUS | Status: AC | PRN
  Administered 2013-07-07: 80 mL via INTRAVENOUS

## 2013-07-07 MED ORDER — ALBUTEROL SULFATE (2.5 MG/3ML) 0.083% IN NEBU
INHALATION_SOLUTION | RESPIRATORY_TRACT | Status: AC
  Filled 2013-07-07: qty 12

## 2013-07-07 MED ORDER — DM-GUAIFENESIN ER 30-600 MG PO TB12: 1.0000 | ORAL_TABLET | Freq: Two times a day (BID) | ORAL | Status: DC

## 2013-07-07 MED ORDER — ALBUTEROL SULFATE (2.5 MG/3ML) 0.083% IN NEBU
INHALATION_SOLUTION | RESPIRATORY_TRACT | Status: AC
  Filled 2013-07-07: qty 3

## 2013-07-07 MED ORDER — AEROCHAMBER PLUS W/MASK MISC
1.0000 | Freq: Once | Status: AC
  Administered 2013-07-07: 1

## 2013-07-07 MED ORDER — ALBUTEROL SULFATE (2.5 MG/3ML) 0.083% IN NEBU
10.0000 mg | INHALATION_SOLUTION | Freq: Once | RESPIRATORY_TRACT | Status: AC
  Administered 2013-07-07: 10 mg via RESPIRATORY_TRACT

## 2013-07-07 MED ORDER — LORAZEPAM 2 MG/ML IJ SOLN
2.0000 mg | Freq: Once | INTRAMUSCULAR | Status: AC
  Administered 2013-07-07: 2 mg via INTRAVENOUS
  Filled 2013-07-07: qty 1

## 2013-07-07 MED ORDER — PREDNISONE 20 MG PO TABS
60.0000 mg | ORAL_TABLET | Freq: Once | ORAL | Status: AC
  Administered 2013-07-07: 60 mg via ORAL
  Filled 2013-07-07: qty 3

## 2013-07-07 MED ORDER — IPRATROPIUM BROMIDE 0.02 % IN SOLN
0.5000 mg | Freq: Once | RESPIRATORY_TRACT | Status: AC
  Administered 2013-07-07: 0.5 mg via RESPIRATORY_TRACT

## 2013-07-07 MED ORDER — ALBUTEROL SULFATE (2.5 MG/3ML) 0.083% IN NEBU
5.0000 mg | INHALATION_SOLUTION | Freq: Once | RESPIRATORY_TRACT | Status: AC
  Administered 2013-07-07: 5 mg via RESPIRATORY_TRACT

## 2013-07-07 MED ORDER — HYDROCODONE-ACETAMINOPHEN 7.5-325 MG/15ML PO SOLN: 15.0000 mL | Freq: Three times a day (TID) | ORAL | Status: DC | PRN

## 2013-07-07 NOTE — ED Notes (Signed)
Pt sent here from [facility] with SOB and congestion for 5 days. sts fever. D Dimer was elevated at [facility]. Sent here r/o PE.

## 2013-07-07 NOTE — ED Notes (Signed)
Patient c/o  He is claustrophobic, and wants to go outside for a few minuets ; stable

## 2013-07-07 NOTE — ED Notes (Signed)
Cough x 1 week; "wife made me come". Audible wheezing

## 2013-07-07 NOTE — ED Provider Notes (Signed)
Patient reports about 10 days of cough with green sputum production off and on. He started feeling like he was having fever and chills about 2 days ago. He also states he has started having wheezing that he's never had before in the past.  On exam when patient breathes hard he can make himself wheeze. However when he takes normal breaths and I listened to his lungs they are clear.  We discussed using an inhaler with a spacer. We also discussed that most, and use to get to go home and that he could expect to have some cough and fatigue for up to 3 months. However he should feel better than he does today but the next week.  Medical screening examination/treatment/procedure(s) were conducted as a shared visit with non-physician practitioner(s) and myself.  I personally evaluated the patient during the encounter.   EKG Interpretation   Date/Time:  Friday Jul 07 2013 11:52:45 EDT Ventricular Rate:  68 PR Interval:  144 QRS Duration: 70 QT Interval:  372 QTC Calculation: 395 R Axis:   42 Text Interpretation:  Normal sinus rhythm Nonspecific ST abnormality  Baseline wander No significant change since last tracing Confirmed by  Murdo  MD-I, Kohle Winner ([id number]) on 07/07/2013 3:28:26 PM      Rolland Porter, MD, Alanson Aly, MD 07/07/13 [phone]

## 2013-07-07 NOTE — Discharge Instructions (Signed)

## 2013-07-07 NOTE — ED Provider Notes (Signed)
CSN: [id number]     Arrival date & time 07/07/13  0915 History   First MD Initiated Contact with Patient 07/07/13 [phone]     Chief Complaint  Patient presents with  . Cough   (Consider location/radiation/quality/duration/timing/severity/associated sxs/prior Treatment) HPI Comments: 73 year old male presents for evaluation of not being able to breathe. For about 5 days, he has had progressively worsening or cough, shortness of breath, and wheezing. He has also had nausea this morning. All of his symptoms have been progressively worsening. She also just feels generally ill with some body aches and fatigue. He has no significant past medical history is relatively healthy with no history of coronary disease or heart failure. He does not smoke and has never smoked. He has a previous diagnosis of diabetes but his A1c is now under 6 without any medications. He denies any pain in his abdomen or chest pain. Denies palpitations or fluttering. No leg swelling.  Patient is a 73 y.o. male presenting with cough.  Cough Associated symptoms: shortness of breath and wheezing   Associated symptoms: no chest pain, no chills, no fever and no rash     Past Medical History  Diagnosis Date  . Unspecified vitamin D deficiency   . Urethral stricture unspecified   . Hypertrophy of prostate with urinary obstruction and other lower urinary tract symptoms (LUTS)   . Type II or unspecified type diabetes mellitus without mention of complication, not stated as uncontrolled   . Anxiety state, unspecified   . 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 Urinary frequency   . Insomnia, unspecified   . Unspecified glaucoma   . 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 Calculus of kidney   . Other and unspecified hyperlipidemia   . Reflux esophagitis   . Lumbago   . Unspecified essential hypertension   . Herpes zoster without mention of complication   . Glaucoma    Past Surgical History  Procedure Laterality Date  .  Spinal fusion  12/91,11/91    obtained bone from left lower leg  . Eye surgery  2014    cataract  . Eye surgery  2013    Retina  . Cardiac catheterization  1986    normal, Dr Lia Foyer  . Stress thallium  1994     normal;  . Stretching bladder  07/2007    Dr Risa Grill   Family History  Problem Relation Age of Onset  . Diabetes Mother   . Cancer Mother   . Hypertension Father   . Stroke Father   . Alcohol abuse Brother    History  Substance Use Topics  . Smoking status: Former Research scientist (life sciences)  . Smokeless tobacco: Not on file  . Alcohol Use: No    Review of Systems  Constitutional: Positive for fatigue. Negative for fever and chills.  Eyes: Negative for visual disturbance.  Respiratory: Positive for cough, chest tightness, shortness of breath and wheezing.   Cardiovascular: Negative for chest pain and leg swelling.  Gastrointestinal: Positive for nausea. Negative for vomiting, abdominal pain and diarrhea.  Endocrine: Negative for polydipsia and polyuria.  Genitourinary: Negative for dysuria and flank pain.  Musculoskeletal: Negative for gait problem.  Skin: Negative for color change and rash.  Neurological: Negative for speech difficulty and light-headedness.  All other systems reviewed and are negative.   Allergies  Demerol  Home Medications   Prior to Admission medications   Medication Sig Start Date End Date Taking? Authorizing Provider  Cholecalciferol (VITAMIN D)  2000 UNITS tablet Take 2,000 Units by mouth daily.    Historical Provider, MD  LORazepam (ATIVAN) 0.5 MG tablet TAKE ONE TABLET BY MOUTH EVERY EIGHT HOURS     Estill Dooms, MD   BP 123/61  Pulse 66  Temp(Src) 100 F (37.8 C)  Resp 14  SpO2 92% Physical Exam  Nursing note and vitals reviewed. Constitutional: He is oriented to person, place, and time. He appears well-developed and well-nourished. No distress.  HENT:  Head: Normocephalic.  Neck: Normal range of motion. Neck supple. No JVD present. No  tracheal deviation present.  Cardiovascular: Normal rate, regular rhythm and normal heart sounds.  Exam reveals no gallop and no friction rub.   No murmur heard. Pulmonary/Chest: No accessory muscle usage. Tachypnea noted. He is in respiratory distress (mild). He has wheezes. He has rhonchi. He has rales.  Abdominal: Soft. He exhibits no distension and no mass. There is no tenderness. There is no rebound and no guarding.  Lymphadenopathy:    He has no cervical adenopathy.  Neurological: He is alert and oriented to person, place, and time. Coordination normal.  Skin: Skin is warm and dry. No rash noted. He is not diaphoretic.  Psychiatric: He has a normal mood and affect. Judgment normal.    ED Course  Procedures (including critical care time) Labs Review Labs Reviewed  CBC WITH DIFFERENTIAL - Abnormal; Notable for the following:    Platelets 147 (*)    All other components within normal limits  D-DIMER, QUANTITATIVE - Abnormal; Notable for the following:    D-Dimer, Quant 0.60 (*)    All other components within normal limits  POCT I-STAT, CHEM 8 - Abnormal; Notable for the following:    Glucose, Bld 104 (*)    Hemoglobin 17.3 (*)    All other components within normal limits  PRO B NATRIURETIC PEPTIDE    Imaging Review Dg Chest 2 View  07/07/2013   CLINICAL DATA:  One week history of fever, cough and chest congestion  EXAM: CHEST  2 VIEW  COMPARISON:  None.  FINDINGS: The lungs are clear and negative for focal airspace consolidation, pulmonary edema or suspicious pulmonary nodule. Mild central bronchitic changes. No pleural effusion or pneumothorax. Cardiac and mediastinal contours are within normal limits. Probable calcified right hilar lymph node. No acute fracture or lytic or blastic osseous lesions. The visualized upper abdominal bowel gas pattern is unremarkable.  IMPRESSION: Mild nonspecific bronchial wall thickening could reflect acute or chronic bronchitis.   Electronically  Signed   By: Jacqulynn Cadet M.D.   On: 07/07/2013 10:33    SaO2 re-checked and was 92%   950: getting CXR, EKG, neb    1040: XR and EKG normal, minimal improvement with duoneb, SaO2 is now 93%.  Will repeat neb with 10 mg albuterol and 0.5 mg atrovent, sending labs   1125: no obvious cause found, labs came back with positive D-dimer   MDM   1. SOB (shortness of breath)   2. Hypoxia   3. Positive D dimer    Patient with cough, shortness of breath, decreased oxygen saturation, elevated d-dimer. Needs to rule out pulmonary embolism. Transferred to the emergency department via shuttle.  Meds ordered this encounter  Medications  . ipratropium (ATROVENT) nebulizer solution 0.5 mg    Sig:   . albuterol (PROVENTIL) (2.5 MG/3ML) 0.083% nebulizer solution 5 mg    Sig:   . albuterol (PROVENTIL) (2.5 MG/3ML) 0.083% nebulizer solution 10 mg    Sig:   .  ipratropium (ATROVENT) nebulizer solution 0.5 mg    Sig:      Liam Graham, PA-C 07/07/13 1128

## 2013-07-07 NOTE — ED Provider Notes (Signed)
CSN: [id number]     Arrival date & time 07/07/13  1148 History   First MD Initiated Contact with Patient 07/07/13 1220     Chief Complaint  Patient presents with  . Shortness of Breath     (Consider location/radiation/quality/duration/timing/severity/associated sxs/prior Treatment) HPI Comments: Patient is a 73 year old male with history of urethral stricture, LUTS, diabetes, glaucoma, hyperlipidemia, and hypertension who presents today for evaluation of shortness of breath. This has been gradually worsening over the past 5 days. He has had wheezing and "bubbling" from his lungs. He has had associated cough. Patient is a former smoker and still wears nicotine patches. He quit 23 years ago. He has associated cough. No hemoptysis. No leg swelling or recent surgeries. No long trips. He was seen at [facility] and given nebulizer treatment x 2. His wife states he sounds improved, but patient reports still feeling short of breath. A d dimer was done which was 0.6 and he was sent here for CT angio of his chest.   The history is provided by the patient. No language interpreter was used.    Past Medical History  Diagnosis Date  . Unspecified vitamin D deficiency   . Urethral stricture unspecified   . Hypertrophy of prostate with urinary obstruction and other lower urinary tract symptoms (LUTS)   . Type II or unspecified type diabetes mellitus without mention of complication, not stated as uncontrolled   . Anxiety state, unspecified   . 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 Urinary frequency   . Insomnia, unspecified   . Unspecified glaucoma   . 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 Calculus of kidney   . Other and unspecified hyperlipidemia   . Reflux esophagitis   . Lumbago   . Unspecified essential hypertension   . Herpes zoster without mention of complication   . Glaucoma    Past Surgical History  Procedure Laterality Date  . Spinal fusion  12/91,11/91    obtained bone from left  lower leg  . Eye surgery  2014    cataract  . Eye surgery  2013    Retina  . Cardiac catheterization  1986    normal, Dr Lia Foyer  . Stress thallium  1994     normal;  . Stretching bladder  07/2007    Dr Risa Grill   Family History  Problem Relation Age of Onset  . Diabetes Mother   . Cancer Mother   . Hypertension Father   . Stroke Father   . Alcohol abuse Brother    History  Substance Use Topics  . Smoking status: Former Research scientist (life sciences)  . Smokeless tobacco: Not on file  . Alcohol Use: No    Review of Systems  Constitutional: Positive for fever and chills.  Respiratory: Positive for cough, chest tightness, shortness of breath and wheezing.   Cardiovascular: Negative for chest pain and leg swelling.  Gastrointestinal: Negative for nausea, vomiting and abdominal pain.  All other systems reviewed and are negative.     Allergies  Demerol  Home Medications   Prior to Admission medications   Medication Sig Start Date End Date Taking? Authorizing Provider  LORazepam (ATIVAN) 0.5 MG tablet TAKE ONE TABLET BY MOUTH EVERY EIGHT HOURS    Yes Estill Dooms, MD  nicotine polacrilex (COMMIT) 2 MG lozenge Take 2 mg by mouth as needed for smoking cessation.   Yes Historical Provider, MD  timolol (TIMOPTIC) 0.25 % ophthalmic solution Place 1 drop into both  eyes 2 (two) times daily.   Yes Historical Provider, MD   BP 120/98  Pulse 67  Resp 18  SpO2 96% Physical Exam  Nursing note and vitals reviewed. Constitutional: He is oriented to person, place, and time. He appears well-developed and well-nourished. No distress.  HENT:  Head: Normocephalic and atraumatic.  Right Ear: External ear normal.  Left Ear: External ear normal.  Nose: Nose normal.  Eyes: Conjunctivae are normal.  Neck: Normal range of motion. No tracheal deviation present.  Cardiovascular: Normal rate, regular rhythm and normal heart sounds.   Pulmonary/Chest: Effort normal. No stridor. No respiratory distress. He has  wheezes. He has rhonchi. He has rales.  Speaking easily in full sentences.   Abdominal: Soft. He exhibits no distension. There is no tenderness.  Musculoskeletal: Normal range of motion.  Neurological: He is alert and oriented to person, place, and time.  Skin: Skin is warm and dry. He is not diaphoretic.  Psychiatric: He has a normal mood and affect. His behavior is normal.    ED Course  Procedures (including critical care time) Labs Review Labs Reviewed  Randolm Idol, ED   CBC    Component Value Date/Time   WBC 6.2 07/07/2013 1038   RBC 4.87 07/07/2013 1038   HGB 17.3* 07/07/2013 1054   HCT 51.0 07/07/2013 1054   PLT 147* 07/07/2013 1038   MCV 95.5 07/07/2013 1038   MCH 32.0 07/07/2013 1038   MCHC 33.5 07/07/2013 1038   RDW 13.4 07/07/2013 1038   LYMPHSABS 0.9 07/07/2013 1038   MONOABS 0.7 07/07/2013 1038   EOSABS 0.1 07/07/2013 1038   BASOSABS 0.0 07/07/2013 1038   BMET    Component Value Date/Time   NA 139 07/07/2013 1054   NA 143 01/12/2013 0824   K 4.3 07/07/2013 1054   CL 99 07/07/2013 1054   CO2 26 01/12/2013 0824   GLUCOSE 104* 07/07/2013 1054   GLUCOSE 111* 01/12/2013 0824   BUN 14 07/07/2013 1054   BUN 26 01/12/2013 0824   CREATININE 1.10 07/07/2013 1054   CALCIUM 9.3 01/12/2013 0824   GFRNONAA 63 01/12/2013 0824   GFRAA 72 01/12/2013 0824    BNP    Component Value Date/Time   PROBNP 442.6* 07/07/2013 1038     Imaging Review Dg Chest 2 View  07/07/2013   CLINICAL DATA:  One week history of fever, cough and chest congestion  EXAM: CHEST  2 VIEW  COMPARISON:  None.  FINDINGS: The lungs are clear and negative for focal airspace consolidation, pulmonary edema or suspicious pulmonary nodule. Mild central bronchitic changes. No pleural effusion or pneumothorax. Cardiac and mediastinal contours are within normal limits. Probable calcified right hilar lymph node. No acute fracture or lytic or blastic osseous lesions. The visualized upper abdominal bowel gas pattern is unremarkable.   IMPRESSION: Mild nonspecific bronchial wall thickening could reflect acute or chronic bronchitis.   Electronically Signed   By: Jacqulynn Cadet M.D.   On: 07/07/2013 10:33   Ct Angio Chest Pe W/cm &/or Wo Cm  07/07/2013   CLINICAL DATA:  73 year old male with shortness of breath and congestion. Initial encounter.  EXAM: CT ANGIOGRAPHY CHEST WITH CONTRAST  TECHNIQUE: Multidetector CT imaging of the chest was performed using the standard protocol during bolus administration of intravenous contrast. Multiplanar CT image reconstructions and MIPs were obtained to evaluate the vascular anatomy.  CONTRAST:  40mL OMNIPAQUE IOHEXOL 350 MG/ML SOLN  COMPARISON:  None.  FINDINGS: Good contrast bolus timing in the pulmonary arterial  tree. Motion artifact, most pronounced at the lung bases. No focal filling defect identified in the pulmonary arterial tree to suggest the presence of acute pulmonary embolism.  Major airways are patent. Lung bases are clear. There is nodular and irregular peribronchovascular opacity in the right upper lobe (images 29-36 of series 6. Incidental calcified granuloma right costophrenic angle. Negative lung parenchyma otherwise. No pleural effusion.  No acute osseous abnormality identified. Negative thoracic inlet. Visualized aorta within normal limits except for atherosclerosis. There is occasional pericardial calcification along the left ventricle posteriorly (e.g. Series 4, image 65). No pericardial effusion. There is calcified coronary artery atherosclerosis. No mediastinal lymphadenopathy. No axillary lymphadenopathy.  Negative visualized liver, gallbladder, spleen, pancreas, adrenal glands, left kidney, and bowel.  Review of the MIP images confirms the above findings.  IMPRESSION: 1.  No evidence of acute pulmonary embolus. 2. Patchy and nodular right upper lobe peribronchovascular opacity most compatible with developing bronchopneumonia. Post treatment radiographs recommended to document  resolution. 3. Mild chronic calcific pericarditis. No pericardial effusion. Aortic and coronary atherosclerosis.   Electronically Signed   By: Lars Pinks M.D.   On: 07/07/2013 14:30     EKG Interpretation   Date/Time:  Friday Jul 07 2013 11:52:45 EDT Ventricular Rate:  68 PR Interval:  144 QRS Duration: 70 QT Interval:  372 QTC Calculation: 395 R Axis:   42 Text Interpretation:  Normal sinus rhythm Nonspecific ST abnormality  Baseline wander No significant change since last tracing Confirmed by  Rio Lajas  MD-I, IVA ([id number]) on 07/07/2013 3:28:26 PM      MDM   Final diagnoses:  Bronchopneumonia    Patient presents to ED from [facility] for evaluation of shortness of breath, wheezing, and rhonchi. D dimer was done at [facility] and was found to be 0.60. When age adjusted this is negative, but CT angio was recommended. CT angio of chest shows patchy and nodular right upper lobe peribronchovascular opacity most compatible with developing bronchopneumonia. Will treat patient with antibiotics, prednisone burst, and albuterol for home. Patient ambulated in hall with o2 saturations remaining >94% on room air. Return instructions given. Vital signs stable for discharge. Patient / Family / Caregiver informed of clinical course, understand medical decision-making process, and agree with plan.     Elwyn Lade, PA-C 07/08/13 1041

## 2013-07-08 NOTE — ED Provider Notes (Signed)
See prior note   Janice Norrie, MD 07/08/13 1102

## 2013-07-09 NOTE — ED Provider Notes (Signed)
Medical screening examination/treatment/procedure(s) were performed by a resident physician or non-physician practitioner and as the supervising physician I was immediately available for consultation/collaboration.  Lynne Leader, MD    Gregor Hams, MD 07/09/13 [phone]

## 2013-07-14 ENCOUNTER — Other Ambulatory Visit: Payer: Self-pay | Admitting: Internal Medicine

## 2013-09-22 ENCOUNTER — Encounter (HOSPITAL_COMMUNITY): Payer: Self-pay | Admitting: Emergency Medicine

## 2013-09-22 ENCOUNTER — Emergency Department (HOSPITAL_COMMUNITY)
Admission: EM | Admit: 2013-09-22 | Discharge: 2013-09-22 | Disposition: A | Discharge status: Home / Self Care | Payer: Medicare Other | Source: Home / Self Care | Attending: Family Medicine | Admitting: Family Medicine

## 2013-09-22 ENCOUNTER — Emergency Department (INDEPENDENT_AMBULATORY_CARE_PROVIDER_SITE_OTHER): Payer: Medicare Other

## 2013-09-22 DIAGNOSIS — J069 Acute upper respiratory infection, unspecified: Secondary | ICD-10-CM

## 2013-09-22 NOTE — Discharge Instructions (Signed)
Thank you for coming in today. Use tylenol or ibuprofen for body aches as needed.  Come back as needed.    Upper Respiratory Infection, Adult An upper respiratory infection (URI) is also sometimes known as the common cold. The upper respiratory tract includes the nose, sinuses, throat, trachea, and bronchi. Bronchi are the airways leading to the lungs. Most people improve within 1 week, but symptoms can last up to 2 weeks. A residual cough may last even longer.  CAUSES Many different viruses can infect the tissues lining the upper respiratory tract. The tissues become irritated and inflamed and often become very moist. Mucus production is also common. A cold is contagious. You can easily spread the virus to others by oral contact. This includes kissing, sharing a glass, coughing, or sneezing. Touching your mouth or nose and then touching a surface, which is then touched by another person, can also spread the virus. SYMPTOMS  Symptoms typically develop 1 to 3 days after you come in contact with a cold virus. Symptoms vary from person to person. They may include:  Runny nose.  Sneezing.  Nasal congestion.  Sinus irritation.  Sore throat.  Loss of voice (laryngitis).  Cough.  Fatigue.  Muscle aches.  Loss of appetite.  Headache.  Low-grade fever. DIAGNOSIS  You might diagnose your own cold based on familiar symptoms, since most people get a cold 2 to 3 times a year. Your caregiver can confirm this based on your exam. Most importantly, your caregiver can check that your symptoms are not due to another disease such as strep throat, sinusitis, pneumonia, asthma, or epiglottitis. Blood tests, throat tests, and X-rays are not necessary to diagnose a common cold, but they may sometimes be helpful in excluding other more serious diseases. Your caregiver will decide if any further tests are required. RISKS AND COMPLICATIONS  You may be at risk for a more severe case of the common cold if  you smoke cigarettes, have chronic heart disease (such as heart failure) or lung disease (such as asthma), or if you have a weakened immune system. The very young and very old are also at risk for more serious infections. Bacterial sinusitis, middle ear infections, and bacterial pneumonia can complicate the common cold. The common cold can worsen asthma and chronic obstructive pulmonary disease (COPD). Sometimes, these complications can require emergency medical care and may be life-threatening. PREVENTION  The best way to protect against getting a cold is to practice good hygiene. Avoid oral or hand contact with people with cold symptoms. Wash your hands often if contact occurs. There is no clear evidence that vitamin C, vitamin E, echinacea, or exercise reduces the chance of developing a cold. However, it is always recommended to get plenty of rest and practice good nutrition. TREATMENT  Treatment is directed at relieving symptoms. There is no cure. Antibiotics are not effective, because the infection is caused by a virus, not by bacteria. Treatment may include:  Increased fluid intake. Sports drinks offer valuable electrolytes, sugars, and fluids.  Breathing heated mist or steam (vaporizer or shower).  Eating chicken soup or other clear broths, and maintaining good nutrition.  Getting plenty of rest.  Using gargles or lozenges for comfort.  Controlling fevers with ibuprofen or acetaminophen as directed by your caregiver.  Increasing usage of your inhaler if you have asthma. Zinc gel and zinc lozenges, taken in the first 24 hours of the common cold, can shorten the duration and lessen the severity of symptoms. Pain medicines may  help with fever, muscle aches, and throat pain. A variety of non-prescription medicines are available to treat congestion and runny nose. Your caregiver can make recommendations and may suggest nasal or lung inhalers for other symptoms.  HOME CARE INSTRUCTIONS   Only  take over-the-counter or prescription medicines for pain, discomfort, or fever as directed by your caregiver.  Use a warm mist humidifier or inhale steam from a shower to increase air moisture. This may keep secretions moist and make it easier to breathe.  Drink enough water and fluids to keep your urine clear or pale yellow.  Rest as needed.  Return to work when your temperature has returned to normal or as your caregiver advises. You may need to stay home longer to avoid infecting others. You can also use a face mask and careful hand washing to prevent spread of the virus. SEEK MEDICAL CARE IF:   After the first few days, you feel you are getting worse rather than better.  You need your caregiver's advice about medicines to control symptoms.  You develop chills, worsening shortness of breath, or brown or red sputum. These may be signs of pneumonia.  You develop yellow or brown nasal discharge or pain in the face, especially when you bend forward. These may be signs of sinusitis.  You develop a fever, swollen neck glands, pain with swallowing, or white areas in the back of your throat. These may be signs of strep throat. SEEK IMMEDIATE MEDICAL CARE IF:   You have a fever.  You develop severe or persistent headache, ear pain, sinus pain, or chest pain.  You develop wheezing, a prolonged cough, cough up blood, or have a change in your usual mucus (if you have chronic lung disease).  You develop sore muscles or a stiff neck. Document Released: 08/12/2000 Document Revised: 05/11/2011 Document Reviewed: 05/24/2013 [facility] Patient Information 2015 [place], [place]. This information is not intended to replace advice given to you by your health care provider. Make sure you discuss any questions you have with your health care provider.

## 2013-09-22 NOTE — ED Provider Notes (Signed)
Caelan Branden is a 73 y.o. male who presents to Urgent Care today for body aches headache cough congestion and runny nose. Symptoms present since yesterday evening. He denies any fevers or nausea vomiting or diarrhea. He denies any chest pain or palpitations. He feels well otherwise. He has not tried any medications for his symptoms. Patient has a history of pneumonia with similar symptoms. He is concerned that he may have pneumonia.   Past Medical History  Diagnosis Date  . Unspecified vitamin D deficiency   . Urethral stricture unspecified   . Hypertrophy of prostate with urinary obstruction and other lower urinary tract symptoms (LUTS)   . Type II or unspecified type diabetes mellitus without mention of complication, not stated as uncontrolled   . Anxiety state, unspecified   . 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 Urinary frequency   . Insomnia, unspecified   . Unspecified glaucoma   . 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 Calculus of kidney   . Other and unspecified hyperlipidemia   . Reflux esophagitis   . Lumbago   . Unspecified essential hypertension   . Herpes zoster without mention of complication   . Glaucoma    History  Substance Use Topics  . Smoking status: Former Research scientist (life sciences)  . Smokeless tobacco: Not on file  . Alcohol Use: No   ROS as above Medications: No current facility-administered medications for this encounter.   Current Outpatient Prescriptions  Medication Sig Dispense Refill  . dextromethorphan-guaiFENesin (MUCINEX DM) 30-600 MG per 12 hr tablet Take 1 tablet by mouth 2 (two) times daily.  14 tablet  0  . HYDROcodone-acetaminophen (HYCET) 7.5-325 mg/15 ml solution Take 15 mLs by mouth every 8 (eight) hours as needed for moderate pain.  120 mL  0  . levofloxacin (LEVAQUIN) 500 MG tablet Take 1 tablet (500 mg total) by mouth daily. X 7 days  7 tablet  0  . LORazepam (ATIVAN) 0.5 MG tablet TAKE ONE TABLET BY MOUTH EVERY EIGHT HOURS   90 tablet  1  .  nicotine polacrilex (COMMIT) 2 MG lozenge Take 2 mg by mouth as needed for smoking cessation.      . predniSONE (DELTASONE) 20 MG tablet Take 2 tablets (40 mg total) by mouth daily.  10 tablet  0  . timolol (TIMOPTIC) 0.25 % ophthalmic solution Place 1 drop into both eyes 2 (two) times daily.        Exam:  BP 128/82  Pulse 72  Temp(Src) 98.4 F (36.9 C) (Oral)  Resp 16  SpO2 97% Gen: Well NAD HEENT: EOMI,  MMM normal posterior pharynx and tympanic membranes Lungs: Normal work of breathing. CTABL Heart: RRR no MRG Abd: NABS, Soft. Nondistended, Nontender Exts: Brisk capillary refill, warm and well perfused.   No results found for this or any previous visit (from the past 24 hour(s)). Dg Chest 2 View  09/22/2013   CLINICAL DATA:  69 -year-old male with abnormal pulmonary ossicle station at the left lung base. Former smoker. Right lung pneumonia suspected in May. Initial encounter.  EXAM: CHEST  2 VIEW  COMPARISON:  Chest CTA 07/07/2013.  FINDINGS: Lung volumes are within normal limits. Normal cardiac size and mediastinal contours. Visualized tracheal air column is within normal limits. No pneumothorax, pulmonary edema, pleural effusion or confluent pulmonary opacity. No acute osseous abnormality identified.  IMPRESSION: No acute cardiopulmonary abnormality.   Electronically Signed   By: Lars Pinks M.D.   On: 09/22/2013 14:14  Assessment and Plan: 73 y.o. male with viral URI. Lungs are clear to auscultation and chest x-ray is normal. Likely viral URI. Plan for watchful waiting with treatment with over-the-counter medications. Followup as needed.  Discussed warning signs or symptoms. Please see discharge instructions. Patient expresses understanding.   This note was created using Systems analyst. Any transcription errors are unintended.    Gregor Hams, MD 09/22/13 [phone]

## 2013-09-22 NOTE — ED Notes (Signed)
C/o body ache States he is nausea but has not vomit States skin feels tingling.   No change in eating habits.

## 2013-09-27 ENCOUNTER — Emergency Department (HOSPITAL_COMMUNITY)
Admission: EM | Admit: 2013-09-27 | Discharge: 2013-09-27 | Disposition: A | Discharge status: Other Acute Inpatient Hospital | Payer: Medicare Other | Source: Home / Self Care | Attending: Family Medicine | Admitting: Family Medicine

## 2013-09-27 ENCOUNTER — Emergency Department (HOSPITAL_COMMUNITY)
Admission: EM | Admit: 2013-09-27 | Discharge: 2013-09-27 | Disposition: A | Discharge status: Home / Self Care | Payer: Medicare Other | Attending: Emergency Medicine | Admitting: Emergency Medicine

## 2013-09-27 ENCOUNTER — Emergency Department (HOSPITAL_COMMUNITY): Payer: Medicare Other

## 2013-09-27 ENCOUNTER — Encounter (HOSPITAL_COMMUNITY): Payer: Self-pay | Admitting: Emergency Medicine

## 2013-09-27 DIAGNOSIS — Z8739 Personal history of other diseases of the musculoskeletal system and connective tissue: Secondary | ICD-10-CM | POA: Diagnosis not present

## 2013-09-27 DIAGNOSIS — I10 Essential (primary) hypertension: Secondary | ICD-10-CM | POA: Diagnosis not present

## 2013-09-27 DIAGNOSIS — F411 Generalized anxiety disorder: Secondary | ICD-10-CM | POA: Diagnosis not present

## 2013-09-27 DIAGNOSIS — R51 Headache: Secondary | ICD-10-CM

## 2013-09-27 DIAGNOSIS — R519 Headache, unspecified: Secondary | ICD-10-CM

## 2013-09-27 DIAGNOSIS — Z79899 Other long term (current) drug therapy: Secondary | ICD-10-CM | POA: Insufficient documentation

## 2013-09-27 DIAGNOSIS — Z8719 Personal history of other diseases of the digestive system: Secondary | ICD-10-CM | POA: Diagnosis not present

## 2013-09-27 DIAGNOSIS — Z87891 Personal history of nicotine dependence: Secondary | ICD-10-CM | POA: Insufficient documentation

## 2013-09-27 DIAGNOSIS — R319 Hematuria, unspecified: Secondary | ICD-10-CM | POA: Diagnosis not present

## 2013-09-27 DIAGNOSIS — Z8669 Personal history of other diseases of the nervous system and sense organs: Secondary | ICD-10-CM | POA: Diagnosis not present

## 2013-09-27 DIAGNOSIS — Z9889 Other specified postprocedural states: Secondary | ICD-10-CM | POA: Insufficient documentation

## 2013-09-27 DIAGNOSIS — Z8619 Personal history of other infectious and parasitic diseases: Secondary | ICD-10-CM | POA: Insufficient documentation

## 2013-09-27 DIAGNOSIS — Z87442 Personal history of urinary calculi: Secondary | ICD-10-CM | POA: Diagnosis not present

## 2013-09-27 DIAGNOSIS — E119 Type 2 diabetes mellitus without complications: Secondary | ICD-10-CM | POA: Diagnosis not present

## 2013-09-27 LAB — URINALYSIS, ROUTINE W REFLEX MICROSCOPIC
Bilirubin Urine: NEGATIVE
Glucose, UA: NEGATIVE mg/dL
Ketones, ur: NEGATIVE mg/dL
NITRITE: NEGATIVE
PH: 7 (ref 5.0–8.0)
Protein, ur: 30 mg/dL — AB
SPECIFIC GRAVITY, URINE: 1.012 (ref 1.005–1.030)
UROBILINOGEN UA: 1 mg/dL (ref 0.0–1.0)

## 2013-09-27 LAB — CBC WITH DIFFERENTIAL/PLATELET
BASOS PCT: 0 % (ref 0–1)
Basophils Absolute: 0 10*3/uL (ref 0.0–0.1)
EOS ABS: 0.1 10*3/uL (ref 0.0–0.7)
EOS PCT: 1 % (ref 0–5)
HEMATOCRIT: 42.5 % (ref 39.0–52.0)
HEMOGLOBIN: 13.7 g/dL (ref 13.0–17.0)
Lymphocytes Relative: 16 % (ref 12–46)
Lymphs Abs: 1.2 10*3/uL (ref 0.7–4.0)
MCH: 31.6 pg (ref 26.0–34.0)
MCHC: 32.2 g/dL (ref 30.0–36.0)
MCV: 97.9 fL (ref 78.0–100.0)
MONO ABS: 0.9 10*3/uL (ref 0.1–1.0)
MONOS PCT: 13 % — AB (ref 3–12)
NEUTROS PCT: 70 % (ref 43–77)
Neutro Abs: 4.9 10*3/uL (ref 1.7–7.7)
Platelets: 243 10*3/uL (ref 150–400)
RBC: 4.34 MIL/uL (ref 4.22–5.81)
RDW: 14 % (ref 11.5–15.5)
WBC: 7.1 10*3/uL (ref 4.0–10.5)

## 2013-09-27 LAB — COMPREHENSIVE METABOLIC PANEL
ALBUMIN: 2.9 g/dL — AB (ref 3.5–5.2)
ALT: 18 U/L (ref 0–53)
ANION GAP: 14 (ref 5–15)
AST: 20 U/L (ref 0–37)
Alkaline Phosphatase: 159 U/L — ABNORMAL HIGH (ref 39–117)
BUN: 13 mg/dL (ref 6–23)
CALCIUM: 8.8 mg/dL (ref 8.4–10.5)
CO2: 27 mEq/L (ref 19–32)
CREATININE: 1.13 mg/dL (ref 0.50–1.35)
Chloride: 100 mEq/L (ref 96–112)
GFR calc Af Amer: 74 mL/min — ABNORMAL LOW (ref >90)
GFR calc non Af Amer: 64 mL/min — ABNORMAL LOW (ref >90)
Glucose, Bld: 94 mg/dL (ref 70–99)
Potassium: 3.8 mEq/L (ref 3.7–5.3)
Sodium: 141 mEq/L (ref 137–147)
TOTAL PROTEIN: 7.2 g/dL (ref 6.0–8.3)
Total Bilirubin: 0.4 mg/dL (ref 0.3–1.2)

## 2013-09-27 LAB — URINE MICROSCOPIC-ADD ON

## 2013-09-27 MED ORDER — DIPHENHYDRAMINE HCL 50 MG/ML IJ SOLN
25.0000 mg | Freq: Once | INTRAMUSCULAR | Status: AC
  Administered 2013-09-27: 25 mg via INTRAVENOUS
  Filled 2013-09-27: qty 1

## 2013-09-27 MED ORDER — METOCLOPRAMIDE HCL 5 MG/ML IJ SOLN
10.0000 mg | Freq: Once | INTRAMUSCULAR | Status: AC
  Administered 2013-09-27: 10 mg via INTRAVENOUS
  Filled 2013-09-27: qty 2

## 2013-09-27 MED ORDER — SODIUM CHLORIDE 0.9 % IV BOLUS (SEPSIS)
1000.0000 mL | Freq: Once | INTRAVENOUS | Status: AC
  Administered 2013-09-27: 1000 mL via INTRAVENOUS

## 2013-09-27 MED ORDER — KETOROLAC TROMETHAMINE 30 MG/ML IJ SOLN
30.0000 mg | Freq: Once | INTRAMUSCULAR | Status: AC
  Administered 2013-09-27: 30 mg via INTRAVENOUS
  Filled 2013-09-27: qty 1

## 2013-09-27 MED ORDER — DEXAMETHASONE SODIUM PHOSPHATE 4 MG/ML IJ SOLN
10.0000 mg | Freq: Once | INTRAMUSCULAR | Status: AC
  Administered 2013-09-27: 10 mg via INTRAVENOUS
  Filled 2013-09-27: qty 3

## 2013-09-27 NOTE — ED Provider Notes (Signed)
Medical screening examination/treatment/procedure(s) were performed by resident physician or non-physician practitioner and as supervising physician I was immediately available for consultation/collaboration.   Pauline Good MD.   Billy Fischer, MD 09/27/13 2017

## 2013-09-27 NOTE — ED Provider Notes (Signed)
CSN: [id number]     Arrival date & time 09/27/13  1708 History   First MD Initiated Contact with Patient 09/27/13 1919     Chief Complaint  Patient presents with  . Headache     (Consider location/radiation/quality/duration/timing/severity/associated sxs/prior Treatment) HPI Patient presents with headache. Headache began at least one week ago.  About that time the patient was diagnosed with a respiratory infection. About the time the patient also had diffuse myalgia, generalized discomfort, chills, cough. Over the interval week, the patient's other symptoms all resolve entirely, and currently he only complains of headache.  Headache is anterior, throbbing, severe, not improved with OTC medication. There is no concurrent visual loss, confusion, disorientation, vomiting, diarrhea, asymmetric weakness, speech deficits, other new complaints.  Past Medical History  Diagnosis Date  . Unspecified vitamin D deficiency   . Urethral stricture unspecified   . Hypertrophy of prostate with urinary obstruction and other lower urinary tract symptoms (LUTS)   . Type II or unspecified type diabetes mellitus without mention of complication, not stated as uncontrolled   . Anxiety state, unspecified   . 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 Urinary frequency   . Insomnia, unspecified   . Unspecified glaucoma   . 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 Calculus of kidney   . Other and unspecified hyperlipidemia   . Reflux esophagitis   . Lumbago   . Unspecified essential hypertension   . Herpes zoster without mention of complication   . Glaucoma    Past Surgical History  Procedure Laterality Date  . Spinal fusion  12/91,11/91    obtained bone from left lower leg  . Eye surgery  2014    cataract  . Eye surgery  2013    Retina  . Cardiac catheterization  1986    normal, Dr Lia Foyer  . Stress thallium  1994     normal;  . Stretching bladder  07/2007    Dr Risa Grill   Family History   Problem Relation Age of Onset  . Diabetes Mother   . Cancer Mother   . Hypertension Father   . Stroke Father   . Alcohol abuse Brother    History  Substance Use Topics  . Smoking status: Former Research scientist (life sciences)  . Smokeless tobacco: Not on file  . Alcohol Use: No    Review of Systems  Constitutional:       Per HPI, otherwise negative  HENT:       Per HPI, otherwise negative  Respiratory:       Per HPI, otherwise negative  Cardiovascular:       Per HPI, otherwise negative  Gastrointestinal: Negative for vomiting.  Endocrine:       Negative aside from HPI  Genitourinary: Negative for dysuria, hematuria, flank pain and difficulty urinating.  Musculoskeletal:       Per HPI, otherwise negative  Skin: Negative.   Neurological: Positive for headaches. Negative for syncope.      Allergies  Codeine and Demerol  Home Medications   Prior to Admission medications   Medication Sig Start Date End Date Taking? Authorizing Provider  acetaminophen (TYLENOL) 325 MG tablet Take 650 mg by mouth every 6 (six) hours as needed for moderate pain.   Yes Historical Provider, MD  dextromethorphan-guaiFENesin (MUCINEX DM) 30-600 MG per 12 hr tablet Take 1 tablet by mouth 2 (two) times daily as needed for cough.   Yes Historical Provider, MD  LORazepam (ATIVAN) 0.5 MG tablet Take  0.5 mg by mouth 2 (two) times daily.   Yes Historical Provider, MD  nicotine polacrilex (COMMIT) 2 MG lozenge Take 2 mg by mouth as needed for smoking cessation.   Yes Historical Provider, MD  timolol (TIMOPTIC) 0.25 % ophthalmic solution Place 1 drop into both eyes 2 (two) times daily.   Yes Historical Provider, MD   BP 140/86  Pulse 82  Temp(Src) 98.7 F (37.1 C) (Oral)  Resp 18  SpO2 98% Physical Exam  Nursing note and vitals reviewed. Constitutional: He is oriented to person, place, and time. He appears well-developed. No distress.  HENT:  Head: Normocephalic and atraumatic.  Eyes: Conjunctivae and EOM are normal.   Cardiovascular: Normal rate and regular rhythm.   Pulmonary/Chest: Effort normal. No stridor. No respiratory distress.  Abdominal: He exhibits no distension.  Musculoskeletal: He exhibits no edema.  Neurological: He is alert and oriented to person, place, and time. He displays no atrophy and no tremor. No cranial nerve deficit or sensory deficit. He exhibits normal muscle tone. He displays no seizure activity. Coordination normal.  Skin: Skin is warm and dry.  Psychiatric: He has a normal mood and affect.    ED Course  Procedures (including critical care time) Labs Review Labs Reviewed  CBC WITH DIFFERENTIAL - Abnormal; Notable for the following:    Monocytes Relative 13 (*)    All other components within normal limits  COMPREHENSIVE METABOLIC PANEL - Abnormal; Notable for the following:    Albumin 2.9 (*)    Alkaline Phosphatase 159 (*)    GFR calc non Af Amer 64 (*)    GFR calc Af Amer 74 (*)    All other components within normal limits  URINALYSIS, ROUTINE W REFLEX MICROSCOPIC    Imaging Review Ct Head Wo Contrast  09/27/2013   CLINICAL DATA:  Headaches for 5 days  EXAM: CT HEAD WITHOUT CONTRAST  TECHNIQUE: Contiguous axial images were obtained from the base of the skull through the vertex without intravenous contrast.  COMPARISON:  None.  FINDINGS: The bony calvarium is intact. No gross soft tissue abnormality is noted. The ventricles are mildly prominent as are the cortical sulci consistent with mild cortical atrophy. This is commensurate with the patient's given age. No findings to suggest acute hemorrhage, acute infarction or space-occupying mass lesion are noted.  IMPRESSION: Chronic changes without acute abnormality.   Electronically Signed   By: Inez Catalina M.D.   On: 09/27/2013 18:06   2230: Headache has resolved.  Patient states he feels substantially better, wants to go home. Patient denies other symptoms.   MDM   Final diagnoses:  Acute nonintractable headache,  unspecified headache type    Patient presents after recent URI illness now with headache.  Patient has no abdominal pain, other complaints.  Patient is afebrile, hemodynamically stable, awake, alert, and this condition resolves in the emergency department. Patient is largely reassuring findings, there is mild hematuria.  Patient's history of prostatic and ureteral issues, and currently no urinary complaints.  No evidence for ongoing infection, no fever, no leukocytosis.  Urine culture will be sent.  With resolution of his complaints, no decompensation, discharged to follow up with primary care. With the patient's headache there some concern for possible sinus etiology.  Patient was advised to stop his current medication regimen, and if you develop recurrence of his headache to follow up with primary care, and initiate inhaled triamcinolone for sinus pressure relief.     Carmin Muskrat, MD 09/28/13 [phone]

## 2013-09-27 NOTE — Discharge Instructions (Signed)
As discussed, it is important that you follow up as soon as possible with your physician for continued management of your condition.  Please stop using the Mucinex.  If you get a recurrence of your forehead type soreness, please use triamcinolone, inhaled, as directed.  This is available at most pharmacies.  If you develop any new, or concerning changes in your condition, please return to the emergency department immediately.

## 2013-09-27 NOTE — ED Notes (Signed)
The pt has had a headache for 1-2 weeks.  [facility] and was diagnosed with a uri.Marcus [name]  His headache is no better no appetite not eating since last thursday

## 2013-09-27 NOTE — ED Notes (Signed)
C/o headache since 7-24, unable to get any relief w OTC medications, only relief is from pressing on pressure points w hands. Keeping him from working his usual 40 hours/week

## 2013-09-27 NOTE — ED Notes (Signed)
Pt reports HA that began on Sunday progressively worse - admits to sinus pain as well and chills on Sunday, low grade fever however no fever today. Pt recently dx w/ URI at [facility] on 09/22/2013 - admits to mild photophobia however denies any n/v.

## 2013-09-27 NOTE — ED Provider Notes (Signed)
CSN: [id number]     Arrival date & time 09/27/13  1525 History   First MD Initiated Contact with Patient 09/27/13 1624     Chief Complaint  Patient presents with  . Headache   (Consider location/radiation/quality/duration/timing/severity/associated sxs/prior Treatment) Patient is a 73 y.o. male presenting with headaches. The history is provided by the patient. No language interpreter was used.  Headache Pain location:  Generalized Quality:  Dull Radiates to:  Does not radiate Severity currently:  7/10 Severity at highest:  7/10 Onset quality:  Gradual Duration:  7 days Timing:  Constant Chronicity:  New Similar to prior headaches: no   Relieved by:  Nothing Worsened by:  Nothing tried Ineffective treatments:  None tried Associated symptoms: congestion and cough   Risk factors: no anger   Pt complains of a cough and congestion.   Pt was seen on 7/24 for a cough.  Pt advised to take otc medications.   Chest xray showed no pneumonia.  Pt denis sinus congestion.   Pt reports he normally works 40 hours a week.  Pt reports he has been in bed all week.  He reports he can not work  Past Medical History  Diagnosis Date  . Unspecified vitamin D deficiency   . Urethral stricture unspecified   . Hypertrophy of prostate with urinary obstruction and other lower urinary tract symptoms (LUTS)   . Type II or unspecified type diabetes mellitus without mention of complication, not stated as uncontrolled   . Anxiety state, unspecified   . 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 Urinary frequency   . Insomnia, unspecified   . Unspecified glaucoma   . 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 Calculus of kidney   . Other and unspecified hyperlipidemia   . Reflux esophagitis   . Lumbago   . Unspecified essential hypertension   . Herpes zoster without mention of complication   . Glaucoma    Past Surgical History  Procedure Laterality Date  . Spinal fusion  12/91,11/91    obtained  bone from left lower leg  . Eye surgery  2014    cataract  . Eye surgery  2013    Retina  . Cardiac catheterization  1986    normal, Dr Lia Foyer  . Stress thallium  1994     normal;  . Stretching bladder  07/2007    Dr Risa Grill   Family History  Problem Relation Age of Onset  . Diabetes Mother   . Cancer Mother   . Hypertension Father   . Stroke Father   . Alcohol abuse Brother    History  Substance Use Topics  . Smoking status: Former Research scientist (life sciences)  . Smokeless tobacco: Not on file  . Alcohol Use: No    Review of Systems  HENT: Positive for congestion.   Respiratory: Positive for cough.   Neurological: Positive for headaches.  All other systems reviewed and are negative.   Allergies  Demerol  Home Medications   Prior to Admission medications   Medication Sig Start Date End Date Taking? Authorizing Provider  dextromethorphan-guaiFENesin (MUCINEX DM) 30-600 MG per 12 hr tablet Take 1 tablet by mouth 2 (two) times daily. 07/07/13  Yes Kara Mead Merrell, PA-C  LORazepam (ATIVAN) 0.5 MG tablet TAKE ONE TABLET BY MOUTH EVERY EIGHT HOURS    Yes Estill Dooms, MD  timolol (TIMOPTIC) 0.25 % ophthalmic solution Place 1 drop into both eyes 2 (two) times daily.   Yes Historical Provider,  MD  HYDROcodone-acetaminophen (HYCET) 7.5-325 mg/15 ml solution Take 15 mLs by mouth every 8 (eight) hours as needed for moderate pain. 07/07/13   Elwyn Lade, PA-C  levofloxacin (LEVAQUIN) 500 MG tablet Take 1 tablet (500 mg total) by mouth daily. X 7 days 07/07/13   Elwyn Lade, PA-C  nicotine polacrilex (COMMIT) 2 MG lozenge Take 2 mg by mouth as needed for smoking cessation.    Historical Provider, MD  predniSONE (DELTASONE) 20 MG tablet Take 2 tablets (40 mg total) by mouth daily. 07/07/13   Elwyn Lade, PA-C   BP 118/83  Pulse 75  Temp(Src) 98.5 F (36.9 C) (Oral)  Resp 17  SpO2 98% Physical Exam  Nursing note and vitals reviewed. Constitutional: He is oriented to person, place, and  time. He appears well-developed and well-nourished.  HENT:  Head: Normocephalic.  Eyes: Conjunctivae and EOM are normal. Pupils are equal, round, and reactive to light.  Neck: Normal range of motion.  Cardiovascular: Normal rate and regular rhythm.   Pulmonary/Chest: Effort normal.  Abdominal: Soft. He exhibits no distension.  Musculoskeletal: Normal range of motion.  Neurological: He is alert and oriented to person, place, and time.  Skin: Skin is warm.  Psychiatric: He has a normal mood and affect.    ED Course  Procedures (including critical care time) Labs Review Labs Reviewed - No data to display  Imaging Review No results found.   MDM   1. Acute intractable headache, unspecified headache type      I discussed with Dr. Juventino Slovak.   Given the severity of pt's symptoms and his age.   Pt needs ct scan.     [place], PA-C 09/27/13 [phone]

## 2013-10-03 ENCOUNTER — Encounter: Payer: Self-pay | Admitting: Internal Medicine

## 2013-10-03 ENCOUNTER — Ambulatory Visit (INDEPENDENT_AMBULATORY_CARE_PROVIDER_SITE_OTHER): Payer: Medicare Other | Admitting: Internal Medicine

## 2013-10-03 VITALS — BP 124/80 | HR 75 | Temp 97.4°F | Resp 20 | Ht 67.0 in | Wt 202.0 lb

## 2013-10-03 DIAGNOSIS — M545 Low back pain, unspecified: Secondary | ICD-10-CM

## 2013-10-03 DIAGNOSIS — J209 Acute bronchitis, unspecified: Secondary | ICD-10-CM

## 2013-10-03 MED ORDER — BUDESONIDE-FORMOTEROL FUMARATE 160-4.5 MCG/ACT IN AERO: 2.0000 | INHALATION_SPRAY | Freq: Two times a day (BID) | RESPIRATORY_TRACT | Status: DC

## 2013-10-03 MED ORDER — TRAMADOL HCL 50 MG PO TABS: ORAL_TABLET | ORAL | Status: DC

## 2013-10-03 MED ORDER — DOXYCYCLINE HYCLATE 100 MG PO TABS: ORAL_TABLET | ORAL | Status: DC

## 2013-10-03 NOTE — Progress Notes (Signed)
Patient ID: Marcus [name], male   DOB: 1940-11-11, 73 y.o.   MRN: [id number]    Location:    [facility]  Place of Service:  OFFICE    Allergies  Allergen Reactions  . Codeine Other (See Comments)    constipation  . Demerol [Meperidine] Other (See Comments)    Drops blood pressure     Chief Complaint  Patient presents with  . 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  Acute bronchitis, unspecified organism: Seen 09/22/13 and 09/27/13 at [facility]gent [facility] for acute respiratory issues complicated by sinus pressure and severe headache. He was prescribed Nasacort, but no antibiotics. He has had chills, night sweats, and persistent cough and wheeze.  Bilateral low back pain without sciatica: aggravated by cough and recent respiratory issues. Hurting in the lumbar area. A little worse on th right as compared to the left.    Medications: Patient's Medications  New Prescriptions   No medications on file  Previous Medications   ACETAMINOPHEN (TYLENOL) 325 MG TABLET    Take 650 mg by mouth every 6 (six) hours as needed for moderate pain.   LORAZEPAM (ATIVAN) 0.5 MG TABLET    Take 0.5 mg by mouth 2 (two) times daily.   NICOTINE POLACRILEX (COMMIT) 2 MG LOZENGE    Take 2 mg by mouth as needed for smoking cessation.   TIMOLOL (TIMOPTIC) 0.25 % OPHTHALMIC SOLUTION    Place 1 drop into both eyes 2 (two) times daily.  Modified Medications   No medications on file  Discontinued Medications   No medications on file     Review of Systems  Constitutional: Negative for fever, activity change, appetite change, fatigue and unexpected weight change.  HENT: Positive for congestion, ear pain, rhinorrhea, sinus pressure and sore throat.   Eyes:       Hx glaucoma  Respiratory: Positive for  shortness of breath and wheezing.   Cardiovascular: Negative.  Negative for chest pain, palpitations and leg swelling.  Gastrointestinal: Positive for constipation. Negative for abdominal pain, diarrhea and abdominal distention.  Endocrine: Negative for polydipsia, polyphagia and polyuria.       History of mild elevations in glucose  Genitourinary: Positive for frequency.  Musculoskeletal: Positive for back pain.  Skin: Negative.   Allergic/Immunologic: Negative.   Neurological: Negative.   Hematological: Negative.   Psychiatric/Behavioral: Negative.     Filed Vitals:   10/03/13 1554  BP: 124/80  Pulse: 75  Temp: 97.4 F (36.3 C)  TempSrc: Oral  Resp: 20  Height: 5\' 7"  (1.702 m)  Weight: 202 lb (91.627 kg)  SpO2: 95%   Body mass index is 31.63 kg/(m^2).  Physical Exam  Constitutional: He is oriented to person, place, and time. He appears well-developed and well-nourished. No distress.  HENT:  Head: Atraumatic.  Right Ear: External ear normal.  Left Ear: External ear normal.  Nose: Nose normal.  Mouth/Throat: No oropharyngeal exudate.  Eyes: Conjunctivae and EOM are normal. Pupils are equal, round, and reactive to light.  Neck: No JVD present. No tracheal deviation present. No thyromegaly present.  Cardiovascular: Normal rate, regular rhythm, normal heart sounds and intact distal pulses.  Exam reveals no gallop and no friction rub.   No murmur heard. Pulmonary/Chest:  No respiratory distress. He has no wheezes. He has no rales.  Upper airway, tracheobronchial, wheeze on forced expiration.  Abdominal: He exhibits no distension and no mass. There is no tenderness.  Musculoskeletal: Normal range of motion. He exhibits no edema and no tenderness.  Lower back ain. Cannot remain in one position long. Has to move. Mildly tender to percussion in the lower back. Walks bent forward.  Lymphadenopathy:    He has no cervical adenopathy.  Neurological: He is alert and oriented to  person, place, and time. He displays abnormal reflex. No cranial nerve deficit. Coordination normal.  Absent patellar deep tendon reflex bilaterally.  Skin: No rash noted. No erythema. No pallor.  Psychiatric: He has a normal mood and affect. His behavior is normal. Judgment and thought content normal.     Labs reviewed: Admission on 09/27/2013, Discharged on 09/27/2013  Component Date Value Ref Range Status  . WBC 09/27/2013 7.1  4.0 - 10.5 K/uL Final  . RBC 09/27/2013 4.34  4.22 - 5.81 MIL/uL Final  . Hemoglobin 09/27/2013 13.7  13.0 - 17.0 g/dL Final  . HCT 09/27/2013 42.5  39.0 - 52.0 % Final  . MCV 09/27/2013 97.9  78.0 - 100.0 fL Final  . MCH 09/27/2013 31.6  26.0 - 34.0 pg Final  . MCHC 09/27/2013 32.2  30.0 - 36.0 g/dL Final  . RDW 09/27/2013 14.0  11.5 - 15.5 % Final  . Platelets 09/27/2013 243  150 - 400 K/uL Final  . Neutrophils Relative % 09/27/2013 70  43 - 77 % Final  . Neutro Abs 09/27/2013 4.9  1.7 - 7.7 K/uL Final  . Lymphocytes Relative 09/27/2013 16  12 - 46 % Final  . Lymphs Abs 09/27/2013 1.2  0.7 - 4.0 K/uL Final  . Monocytes Relative 09/27/2013 13* 3 - 12 % Final  . Monocytes Absolute 09/27/2013 0.9  0.1 - 1.0 K/uL Final  . Eosinophils Relative 09/27/2013 1  0 - 5 % Final  . Eosinophils Absolute 09/27/2013 0.1  0.0 - 0.7 K/uL Final  . Basophils Relative 09/27/2013 0  0 - 1 % Final  . Basophils Absolute 09/27/2013 0.0  0.0 - 0.1 K/uL Final  . Sodium 09/27/2013 141  137 - 147 mEq/L Final  . Potassium 09/27/2013 3.8  3.7 - 5.3 mEq/L Final  . Chloride 09/27/2013 100  96 - 112 mEq/L Final  . CO2 09/27/2013 27  19 - 32 mEq/L Final  . Glucose, Bld 09/27/2013 94  70 - 99 mg/dL Final  . BUN 09/27/2013 13  6 - 23 mg/dL Final  . Creatinine, Ser 09/27/2013 1.13  0.50 - 1.35 mg/dL Final  . Calcium 09/27/2013 8.8  8.4 - 10.5 mg/dL Final  . Total Protein 09/27/2013 7.2  6.0 - 8.3 g/dL Final  . Albumin 09/27/2013 2.9* 3.5 - 5.2 g/dL Final  . AST 09/27/2013 20  0 - 37 U/L  Final  . ALT 09/27/2013 18  0 - 53 U/L Final  . Alkaline Phosphatase 09/27/2013 159* 39 - 117 U/L Final  . Total Bilirubin 09/27/2013 0.4  0.3 - 1.2 mg/dL Final  . GFR calc non Af Amer 09/27/2013 64* >90 mL/min Final  . GFR calc Af Amer 09/27/2013 74* >90 mL/min Final   Comment: (NOTE)                          The eGFR has been calculated using the CKD EPI equation.  This calculation has not been validated in all clinical situations.                          eGFR's persistently <90 mL/min signify possible Chronic Kidney                          Disease.  . Anion gap 09/27/2013 14  5 - 15 Final  . Color, Urine 09/27/2013 YELLOW  YELLOW Final  . APPearance 09/27/2013 CLOUDY* CLEAR Final  . Specific Gravity, Urine 09/27/2013 1.012  1.005 - 1.030 Final  . pH 09/27/2013 7.0  5.0 - 8.0 Final  . Glucose, UA 09/27/2013 NEGATIVE  NEGATIVE mg/dL Final  . Hgb urine dipstick 09/27/2013 MODERATE* NEGATIVE Final  . Bilirubin Urine 09/27/2013 NEGATIVE  NEGATIVE Final  . Ketones, ur 09/27/2013 NEGATIVE  NEGATIVE mg/dL Final  . Protein, ur 09/27/2013 30* NEGATIVE mg/dL Final  . Urobilinogen, UA 09/27/2013 1.0  0.0 - 1.0 mg/dL Final  . Nitrite 09/27/2013 NEGATIVE  NEGATIVE Final  . Leukocytes, UA 09/27/2013 LARGE* NEGATIVE Final  . Squamous Epithelial / LPF 09/27/2013 RARE  RARE Final  . WBC, UA 09/27/2013 21-50  <3 WBC/hpf Final  . RBC / HPF 09/27/2013 11-20  <3 RBC/hpf Final  . Bacteria, UA 09/27/2013 FEW* RARE Final  Admission on 07/07/2013, Discharged on 07/07/2013  Component Date Value Ref Range Status  . Troponin i, poc 07/07/2013 0.00  0.00 - 0.08 ng/mL Final  . Comment 3 07/07/2013          Final   Comment: Due to the release kinetics of cTnI,                          a negative result within the first hours                          of the onset of symptoms does not rule out                          myocardial infarction with certainty.                          If  myocardial infarction is still suspected,                          repeat the test at appropriate intervals.  Admission on 07/07/2013, Discharged on 07/07/2013  Component Date Value Ref Range Status  . WBC 07/07/2013 6.2  4.0 - 10.5 K/uL Final  . RBC 07/07/2013 4.87  4.22 - 5.81 MIL/uL Final  . Hemoglobin 07/07/2013 15.6  13.0 - 17.0 g/dL Final  . HCT 07/07/2013 46.5  39.0 - 52.0 % Final  . MCV 07/07/2013 95.5  78.0 - 100.0 fL Final  . MCH 07/07/2013 32.0  26.0 - 34.0 pg Final  . MCHC 07/07/2013 33.5  30.0 - 36.0 g/dL Final  . RDW 07/07/2013 13.4  11.5 - 15.5 % Final  . Platelets 07/07/2013 147* 150 - 400 K/uL Final  . Neutrophils Relative % 07/07/2013 71  43 - 77 % Final  . Neutro Abs 07/07/2013 4.4  1.7 - 7.7 K/uL Final  . Lymphocytes Relative 07/07/2013 15  12 - 46 % Final  . Lymphs Abs 07/07/2013 0.9  0.7 -  4.0 K/uL Final  . Monocytes Relative 07/07/2013 12  3 - 12 % Final  . Monocytes Absolute 07/07/2013 0.7  0.1 - 1.0 K/uL Final  . Eosinophils Relative 07/07/2013 2  0 - 5 % Final  . Eosinophils Absolute 07/07/2013 0.1  0.0 - 0.7 K/uL Final  . Basophils Relative 07/07/2013 0  0 - 1 % Final  . Basophils Absolute 07/07/2013 0.0  0.0 - 0.1 K/uL Final  . D-Dimer, Quant 07/07/2013 0.60* 0.00 - 0.48 ug/mL-FEU Final   Comment:                                 AT THE INHOUSE ESTABLISHED CUTOFF                          VALUE OF 0.48 ug/mL FEU,                          THIS ASSAY HAS BEEN DOCUMENTED                          IN THE LITERATURE TO HAVE                          A SENSITIVITY AND NEGATIVE                          PREDICTIVE VALUE OF AT LEAST                          98 TO 99%.  THE TEST RESULT                          SHOULD BE CORRELATED WITH                          AN ASSESSMENT OF THE CLINICAL                          PROBABILITY OF DVT / VTE.  Marland Kitchen Pro B Natriuretic peptide (BNP) 07/07/2013 442.6* 0 - 125 pg/mL Final  . Sodium 07/07/2013 139  137 - 147 mEq/L Final  .  Potassium 07/07/2013 4.3  3.7 - 5.3 mEq/L Final  . Chloride 07/07/2013 99  96 - 112 mEq/L Final  . BUN 07/07/2013 14  6 - 23 mg/dL Final  . Creatinine, Ser 07/07/2013 1.10  0.50 - 1.35 mg/dL Final  . Glucose, Bld 53/04/9504 104* 70 - 99 mg/dL Final  . Calcium, Ion 46/28/8055 1.17  1.13 - 1.30 mmol/L Final  . TCO2 07/07/2013 29  0 - 100 mmol/L Final  . Hemoglobin 07/07/2013 17.3* 13.0 - 17.0 g/dL Final  . HCT 98/60/9016 51.0  39.0 - 52.0 % Final      Assessment/Plan  1. Acute bronchitis, unspecified organism - doxycycline (VIBRA-TABS) 100 MG tablet; One twice daily for infection  Dispense: 20 tablet; Refill: 0 - budesonide-formoterol (SYMBICORT) 160-4.5 MCG/ACT inhaler; Inhale 2 puffs into the lungs 2 (two) times daily.  Dispense: 1 Inhaler; Refill: 3  2. Bilateral low back pain without sciatica - traMADol (ULTRAM) 50 MG tablet; One every 6 hours if needed for pain  Dispense: 50 tablet; Refill:  2  

## 2013-10-18 ENCOUNTER — Other Ambulatory Visit: Payer: Self-pay | Admitting: Internal Medicine

## 2013-11-08 ENCOUNTER — Encounter: Payer: Self-pay | Admitting: Internal Medicine

## 2013-11-08 ENCOUNTER — Ambulatory Visit (INDEPENDENT_AMBULATORY_CARE_PROVIDER_SITE_OTHER): Payer: Medicare Other | Admitting: Internal Medicine

## 2013-11-08 VITALS — BP 120/74 | HR 70 | Temp 99.6°F | Resp 10 | Ht 67.0 in | Wt 201.0 lb

## 2013-11-08 DIAGNOSIS — R5383 Other fatigue: Principal | ICD-10-CM

## 2013-11-08 DIAGNOSIS — R635 Abnormal weight gain: Secondary | ICD-10-CM

## 2013-11-08 DIAGNOSIS — E119 Type 2 diabetes mellitus without complications: Secondary | ICD-10-CM

## 2013-11-08 DIAGNOSIS — R5381 Other malaise: Secondary | ICD-10-CM

## 2013-11-08 DIAGNOSIS — M25476 Effusion, unspecified foot: Secondary | ICD-10-CM

## 2013-11-08 DIAGNOSIS — M25473 Effusion, unspecified ankle: Secondary | ICD-10-CM

## 2013-11-08 NOTE — Progress Notes (Signed)
Patient ID: Marcus [name], male   DOB: 04/29/1940, 73 y.o.   MRN: [id number]     Chief Complaint  Patient presents with  . Acute Visit    weight gain, ankle swelling, fatigue   Allergies  Allergen Reactions  . Codeine Other (See Comments)    constipation  . Demerol [Meperidine] Other (See Comments)    Drops blood pressure    HPI 73 y/o male patient is here for acute visit. He mentions feeling tired and this has been there for 3-4 months now. He mentions having gained 7 pounds in last 3 days. On review of chart he was of current weight since past 1 year.  He has noticed swelling around both his ankles left > right for past week as well. 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 Currently off all meds Denies chest pain, palpitations, dyspnea, cough, wheezing Denies headache or dizziness Has noticed a skin lesion, has been applying oatmeal lotion to it and is seeing his dermatologist tomorrow for this No focal weakness No falls reported  Past Medical History  Diagnosis Date  . Unspecified vitamin D deficiency   . Urethral stricture unspecified   . Hypertrophy of prostate with urinary obstruction and other lower urinary tract symptoms (LUTS)   . Type II or unspecified type diabetes mellitus without mention of complication, not stated as uncontrolled   . Anxiety state, unspecified   . 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 Urinary frequency   . Insomnia, unspecified   . Unspecified glaucoma   . 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 Calculus of kidney   . Other and unspecified hyperlipidemia   . Reflux esophagitis   . Lumbago   . Unspecified essential hypertension   . Herpes zoster without mention of complication   . Glaucoma    Current Outpatient Prescriptions on File Prior to Visit  Medication Sig Dispense Refill  . LORazepam (ATIVAN)  0.5 MG tablet TAKE ONE TABLET BY MOUTH EVERY EIGHT HOURS   90 tablet  0  . nicotine polacrilex (COMMIT) 2 MG lozenge Take 2 mg by mouth as needed for smoking cessation.      . timolol (TIMOPTIC) 0.25 % ophthalmic solution Place 1 drop into both eyes 2 (two) times daily.       No current facility-administered medications on file prior to visit.   Physical exam BP 120/74  Pulse 70  Temp(Src) 99.6 F (37.6 C) (Oral)  Resp 10  Ht 5\' 7"  (1.702 m)  Wt 201 lb (91.173 kg)  BMI 31.47 kg/m2  SpO2 96%  General- elderly male in no acute distress Head- atraumatic, normocephalic Eyes- PERRLA, EOMI, no pallor, no icterus Neck- no lymphadenopathy, no thyromegaly, no jugular vein distension Cardiovascular- normal s1,s2, normal dorsalis pedis Respiratory- bilateral clear to auscultation, no wheeze, no rhonchi, no crackles Abdomen- bowel sounds present, soft, non tender Musculoskeletal- able to move all 4 extremities, trace edema of both ankle Skin- has an erythematous skin patch behind right leg with silver lining, non tender Neurological- no focal deficit Psychiatry- alert and oriented to person, place and time, normal mood and affect  Assessment/plan  1. Other malaise and fatigue Rule out anemia and thyroid abnormality along with vit d def.  - CBC with Differential - TSH - Vitamin D, 1,25-dihydroxy - CMP - Hemoglobin  A1c  2. Weight gain Has hx of dm, if this is uncontrolled could be contributing some. Thyroid abnormality to be ruled out. On review of our record, there has been no weight gain. Advised to cut down on salt intake. If the weight gain persists and he has symptoms of SOB/ dyspnea, consider echocardiogram for assessment of systolic dysfunction - TSH - Vitamin D, 1,25-dihydroxy - Hemoglobin A1c  3. Type II or unspecified type diabetes mellitus without mention of complication, not stated as uncontrolled Hx of dm in past, not on any meds currently. With his concern fo weight gain  and low energy level, will get a1c checked to assess for dm and need of medication  4. Edema Possible venous pooling. Advised to keep legs elevated at rest. Will review lab results and consider need for echocardiogram. Can consider small dose diuretics if needed

## 2013-11-09 LAB — COMPREHENSIVE METABOLIC PANEL
ALK PHOS: 183 IU/L — AB (ref 39–117)
ALT: 21 IU/L (ref 0–44)
AST: 22 IU/L (ref 0–40)
Albumin/Globulin Ratio: 1.2 (ref 1.1–2.5)
Albumin: 3.3 g/dL — ABNORMAL LOW (ref 3.5–4.8)
BUN / CREAT RATIO: 15 (ref 10–22)
BUN: 20 mg/dL (ref 8–27)
CHLORIDE: 101 mmol/L (ref 97–108)
CO2: 23 mmol/L (ref 18–29)
Calcium: 8.2 mg/dL — ABNORMAL LOW (ref 8.6–10.2)
Creatinine, Ser: 1.35 mg/dL — ABNORMAL HIGH (ref 0.76–1.27)
GFR calc non Af Amer: 53 mL/min/{1.73_m2} — ABNORMAL LOW (ref >59)
GFR, EST AFRICAN AMERICAN: 61 mL/min/{1.73_m2} (ref >59)
Globulin, Total: 2.7 g/dL (ref 1.5–4.5)
Glucose: 104 mg/dL — ABNORMAL HIGH (ref 65–99)
Potassium: 4.3 mmol/L (ref 3.5–5.2)
Sodium: 140 mmol/L (ref 134–144)
Total Bilirubin: 0.4 mg/dL (ref 0.0–1.2)
Total Protein: 6 g/dL (ref 6.0–8.5)

## 2013-11-09 LAB — CBC WITH DIFFERENTIAL/PLATELET
BASOS: 0 %
Basophils Absolute: 0 10*3/uL (ref 0.0–0.2)
EOS ABS: 0.1 10*3/uL (ref 0.0–0.4)
Eos: 1 %
HCT: 35.8 % — ABNORMAL LOW (ref 37.5–51.0)
HEMOGLOBIN: 11.7 g/dL — AB (ref 12.6–17.7)
Immature Grans (Abs): 0 10*3/uL (ref 0.0–0.1)
Immature Granulocytes: 0 %
Lymphocytes Absolute: 1 10*3/uL (ref 0.7–3.1)
Lymphs: 16 %
MCH: 30.9 pg (ref 26.6–33.0)
MCHC: 32.7 g/dL (ref 31.5–35.7)
MCV: 95 fL (ref 79–97)
MONOS ABS: 1 10*3/uL — AB (ref 0.1–0.9)
Monocytes: 15 %
NEUTROS ABS: 4.4 10*3/uL (ref 1.4–7.0)
Neutrophils Relative %: 68 %
RBC: 3.79 x10E6/uL — ABNORMAL LOW (ref 4.14–5.80)
RDW: 14.8 % (ref 12.3–15.4)
WBC: 6.5 10*3/uL (ref 3.4–10.8)

## 2013-11-09 LAB — TSH: TSH: 1.15 u[IU]/mL (ref 0.450–4.500)

## 2013-11-09 LAB — HEMOGLOBIN A1C
Est. average glucose Bld gHb Est-mCnc: 123 mg/dL
HEMOGLOBIN A1C: 5.9 % — AB (ref 4.8–5.6)

## 2013-11-09 LAB — VITAMIN D 1,25 DIHYDROXY: Vit D, 1,25-Dihydroxy: 32.4 pg/mL (ref 19.9–79.3)

## 2013-12-09 ENCOUNTER — Other Ambulatory Visit: Payer: Self-pay | Admitting: Internal Medicine

## 2013-12-11 ENCOUNTER — Other Ambulatory Visit: Payer: Self-pay | Admitting: *Deleted

## 2013-12-11 MED ORDER — LORAZEPAM 0.5 MG PO TABS: ORAL_TABLET | ORAL | Status: DC

## 2013-12-11 NOTE — Telephone Encounter (Signed)
[facility] 

## 2013-12-13 ENCOUNTER — Other Ambulatory Visit: Payer: Self-pay | Admitting: Internal Medicine

## 2014-01-09 ENCOUNTER — Other Ambulatory Visit: Payer: Self-pay | Admitting: Internal Medicine

## 2014-01-12 ENCOUNTER — Other Ambulatory Visit: Payer: Self-pay | Admitting: *Deleted

## 2014-01-12 DIAGNOSIS — E119 Type 2 diabetes mellitus without complications: Secondary | ICD-10-CM

## 2014-01-12 DIAGNOSIS — E785 Hyperlipidemia, unspecified: Secondary | ICD-10-CM

## 2014-01-12 DIAGNOSIS — R635 Abnormal weight gain: Secondary | ICD-10-CM

## 2014-01-12 DIAGNOSIS — R5381 Other malaise: Secondary | ICD-10-CM

## 2014-01-12 DIAGNOSIS — R5383 Other fatigue: Principal | ICD-10-CM

## 2014-01-15 ENCOUNTER — Other Ambulatory Visit: Payer: Self-pay

## 2014-01-16 LAB — COMPREHENSIVE METABOLIC PANEL
A/G RATIO: 1.2 (ref 1.1–2.5)
ALBUMIN: 3.7 g/dL (ref 3.5–4.8)
ALT: 16 IU/L (ref 0–44)
AST: 20 IU/L (ref 0–40)
Alkaline Phosphatase: 132 IU/L — ABNORMAL HIGH (ref 39–117)
BUN/Creatinine Ratio: 18 (ref 10–22)
BUN: 24 mg/dL (ref 8–27)
CO2: 26 mmol/L (ref 18–29)
Calcium: 9 mg/dL (ref 8.6–10.2)
Chloride: 102 mmol/L (ref 97–108)
Creatinine, Ser: 1.33 mg/dL — ABNORMAL HIGH (ref 0.76–1.27)
GFR calc Af Amer: 62 mL/min/{1.73_m2} (ref >59)
GFR calc non Af Amer: 54 mL/min/{1.73_m2} — ABNORMAL LOW (ref >59)
GLUCOSE: 112 mg/dL — AB (ref 65–99)
Globulin, Total: 3.1 g/dL (ref 1.5–4.5)
POTASSIUM: 4.8 mmol/L (ref 3.5–5.2)
Sodium: 140 mmol/L (ref 134–144)
TOTAL PROTEIN: 6.8 g/dL (ref 6.0–8.5)

## 2014-01-16 LAB — LIPID PANEL
CHOL/HDL RATIO: 5.8 ratio — AB (ref 0.0–5.0)
Cholesterol, Total: 173 mg/dL (ref 100–199)
HDL: 30 mg/dL — AB (ref >39)
LDL CALC: 127 mg/dL — AB (ref 0–99)
TRIGLYCERIDES: 80 mg/dL (ref 0–149)
VLDL CHOLESTEROL CAL: 16 mg/dL (ref 5–40)

## 2014-01-16 LAB — CBC WITH DIFFERENTIAL/PLATELET
BASOS: 1 %
Basophils Absolute: 0 10*3/uL (ref 0.0–0.2)
EOS: 5 %
Eosinophils Absolute: 0.2 10*3/uL (ref 0.0–0.4)
HCT: 41.6 % (ref 37.5–51.0)
Hemoglobin: 14 g/dL (ref 12.6–17.7)
IMMATURE GRANS (ABS): 0 10*3/uL (ref 0.0–0.1)
Immature Granulocytes: 0 %
LYMPHS ABS: 1.3 10*3/uL (ref 0.7–3.1)
Lymphs: 30 %
MCH: 30.6 pg (ref 26.6–33.0)
MCHC: 33.7 g/dL (ref 31.5–35.7)
MCV: 91 fL (ref 79–97)
Monocytes Absolute: 0.6 10*3/uL (ref 0.1–0.9)
Monocytes: 14 %
Neutrophils Absolute: 2.2 10*3/uL (ref 1.4–7.0)
Neutrophils Relative %: 50 %
RBC: 4.57 x10E6/uL (ref 4.14–5.80)
RDW: 15.1 % (ref 12.3–15.4)
WBC: 4.4 10*3/uL (ref 3.4–10.8)

## 2014-01-16 LAB — VITAMIN D 25 HYDROXY (VIT D DEFICIENCY, FRACTURES): Vit D, 25-Hydroxy: 36 ng/mL (ref 30.0–100.0)

## 2014-01-16 LAB — HEMOGLOBIN A1C
Est. average glucose Bld gHb Est-mCnc: 114 mg/dL
Hgb A1c MFr Bld: 5.6 % (ref 4.8–5.6)

## 2014-01-16 LAB — TSH: TSH: 1.62 u[IU]/mL (ref 0.450–4.500)

## 2014-01-17 ENCOUNTER — Encounter: Payer: Self-pay | Admitting: Internal Medicine

## 2014-01-17 ENCOUNTER — Ambulatory Visit (INDEPENDENT_AMBULATORY_CARE_PROVIDER_SITE_OTHER): Payer: Medicare Other | Admitting: Internal Medicine

## 2014-01-17 VITALS — BP 112/76 | HR 54 | Temp 97.4°F | Resp 10 | Ht 67.0 in | Wt 196.0 lb

## 2014-01-17 DIAGNOSIS — M545 Low back pain, unspecified: Secondary | ICD-10-CM

## 2014-01-17 DIAGNOSIS — M25473 Effusion, unspecified ankle: Secondary | ICD-10-CM

## 2014-01-17 DIAGNOSIS — E119 Type 2 diabetes mellitus without complications: Secondary | ICD-10-CM

## 2014-01-17 DIAGNOSIS — E559 Vitamin D deficiency, unspecified: Secondary | ICD-10-CM

## 2014-01-17 DIAGNOSIS — R251 Tremor, unspecified: Secondary | ICD-10-CM

## 2014-01-17 DIAGNOSIS — G252 Other specified forms of tremor: Secondary | ICD-10-CM

## 2014-01-17 DIAGNOSIS — R748 Abnormal levels of other serum enzymes: Secondary | ICD-10-CM

## 2014-01-17 DIAGNOSIS — G250 Essential tremor: Secondary | ICD-10-CM

## 2014-01-17 DIAGNOSIS — E785 Hyperlipidemia, unspecified: Secondary | ICD-10-CM

## 2014-01-17 DIAGNOSIS — J209 Acute bronchitis, unspecified: Secondary | ICD-10-CM

## 2014-01-17 NOTE — Progress Notes (Signed)
Passed clock drawing 

## 2014-01-17 NOTE — Progress Notes (Signed)
Patient ID: Marcus [name], male   DOB: 06/13/1940, 73 y.o.   MRN: [id number]    HISTORY AND PHYSICAL  Location:    [facility]   Place of Service:   OFFICE  Extended Emergency Contact Information Primary Emergency Contact: [facility] Address: [address]          [place], [place] [address] [place] Phone: [phone] Relation: Spouse   Chief Complaint  Patient presents with  . Annual Exam    Yearly check-up, discuss labs (copy printed). Renew xanax/additional refills   . URI    Cold- cough and congestion   . Immunizations    Discuss if ok to get flu with URI symptoms     HPI:  Diabetes mellitus without complication:L controlled  Hyperlipidemia: low HDL and mild elevation in LDL.  Ankle swelling, unspecified laterality; resolved  Bilateral low back pain without sciatica: mild and controlled. Very chronic condition.  Vitamin D deficiency: corrected  Elevated alkaline phosphatase level: mild elevation chronically  Essential and other specified forms of tremor: mild. He thinks it is a little better  Acute bronchitis, unspecified organism: started about 3 weeks ago. Although still coughing, he thinks he is getting better.    Past Medical History  Diagnosis Date  . Unspecified vitamin D deficiency   . Urethral stricture unspecified   . Hypertrophy of prostate with urinary obstruction and other lower urinary tract symptoms (LUTS)   . Type II or unspecified type diabetes mellitus without mention of complication, not stated as uncontrolled   . Anxiety state, unspecified   . 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 Urinary frequency   . Insomnia, unspecified   . Unspecified glaucoma   . 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 Calculus of kidney   . Other and unspecified hyperlipidemia   . Reflux esophagitis   . Lumbago   . Unspecified essential hypertension   . Herpes zoster without mention of complication   . Glaucoma     Past  Surgical History  Procedure Laterality Date  . Spinal fusion  12/91,11/91    obtained bone from left lower leg  . Eye surgery  2014    cataract  . Eye surgery  2013    Retina  . Cardiac catheterization  1986    normal, Dr Lia Foyer  . Stress thallium  1994     normal;  . Stretching bladder  07/2007    Dr Risa Grill    Patient Care Team: Estill Dooms, MD as PCP - General (Internal Medicine) Bernestine Amass, MD as Consulting Physician (Urology)  History   Social History  . Marital Status: Married    Spouse Name: N/A    Number of Children: N/A  . Years of Education: N/A   Occupational History  . Not on file.   Social History Main Topics  . Smoking status: Former Research scientist (life sciences)  . Smokeless tobacco: Not on file  . Alcohol Use: No  . Drug Use: No  . Sexual Activity: Not on file   Other Topics Concern  . Not on file   Social History Narrative     reports that he has quit smoking. He does not have any smokeless tobacco history on file. He reports that he does not drink alcohol or use illicit drugs.  Family History  Problem Relation Age of Onset  . Diabetes Mother   . Cancer Mother   . Hypertension Father   . Stroke Father   .  Alcohol abuse Brother    Family Status  Relation Status Death Age  . Mother Deceased   . Father Deceased   . Brother Deceased   . Sister Alive   . Sister Alive   . Daughter Alive   . Son Alive   . Daughter Alive   . Daughter Alive     Immunization History  Administered Date(s) Administered  . DTaP 04/30/2009, 10/15/2012  . Influenza,inj,Quad PF,36+ Mos 02/01/2013  . Influenza-Unspecified 01/02/2009, 12/01/2011  . Pneumococcal Polysaccharide-23 02/01/2013    Allergies  Allergen Reactions  . Codeine Other (See Comments)    constipation  . Demerol [Meperidine] Other (See Comments)    Drops blood pressure     Medications: Patient's Medications  New Prescriptions   No medications on file  Previous Medications   LORAZEPAM (ATIVAN) 0.5  MG TABLET    TAKE ONE TABLET BY MOUTH EVERY EIGHT HOURS AS NEEDED    NICOTINE POLACRILEX (COMMIT) 2 MG LOZENGE    Take 2 mg by mouth as needed for smoking cessation.   TIMOLOL (TIMOPTIC) 0.25 % OPHTHALMIC SOLUTION    Place 1 drop into both eyes 2 (two) times daily.  Modified Medications   No medications on file  Discontinued Medications   No medications on file    Review of Systems  Constitutional: Negative for fever, activity change, appetite change, fatigue and unexpected weight change.  HENT: Positive for congestion, ear pain, rhinorrhea, sinus pressure and sore throat.   Eyes:       Hx glaucoma  Respiratory: Positive for shortness of breath and wheezing.   Cardiovascular: Negative.  Negative for chest pain, palpitations and leg swelling.  Gastrointestinal: Positive for constipation. Negative for abdominal pain, diarrhea and abdominal distention.  Endocrine: Negative for polydipsia, polyphagia and polyuria.       History of mild elevations in glucose  Genitourinary: Positive for frequency.  Musculoskeletal: Positive for back pain.  Skin: Negative.   Allergic/Immunologic: Negative.   Neurological: Negative.   Hematological: Negative.   Psychiatric/Behavioral: Negative.     Filed Vitals:   01/17/14 1555  BP: 112/76  Pulse: 54  Temp: 97.4 F (36.3 C)  TempSrc: Oral  Resp: 10  Height: 5\' 7"  (1.702 m)  Weight: 196 lb (88.905 kg)   Body mass index is 30.69 kg/(m^2).  Physical Exam  Constitutional: He is oriented to person, place, and time. He appears well-developed and well-nourished. No distress.  HENT:  Head: Atraumatic.  Right Ear: External ear normal.  Left Ear: External ear normal.  Nose: Nose normal.  Mouth/Throat: No oropharyngeal exudate.  Eyes: Conjunctivae and EOM are normal. Pupils are equal, round, and reactive to light.  Neck: No JVD present. No tracheal deviation present. No thyromegaly present.  Cardiovascular: Normal rate, regular rhythm, normal heart  sounds and intact distal pulses.  Exam reveals no gallop and no friction rub.   No murmur heard. Pulmonary/Chest: No respiratory distress. He has no wheezes. He has no rales.  Upper airway, tracheobronchial, wheeze on forced expiration.  Abdominal: He exhibits no distension and no mass. There is no tenderness.  Musculoskeletal: Normal range of motion. He exhibits no edema or tenderness.  Lower back ain. Cannot remain in one position long. Has to move. Mildly tender to percussion in the lower back. Walks bent forward.  Lymphadenopathy:    He has no cervical adenopathy.  Neurological: He is alert and oriented to person, place, and time. He displays abnormal reflex. No cranial nerve deficit. Coordination normal.  Absent  patellar deep tendon reflex bilaterally.  Skin: No rash noted. No erythema. No pallor.  Psychiatric: He has a normal mood and affect. His behavior is normal. Judgment and thought content normal.     Labs reviewed: Lab on 01/15/2014  Component Date Value Ref Range Status  . WBC 01/15/2014 4.4  3.4 - 10.8 x10E3/uL Final  . RBC 01/15/2014 4.57  4.14 - 5.80 x10E6/uL Final  . Hemoglobin 01/15/2014 14.0  12.6 - 17.7 g/dL Final  . HCT 01/15/2014 41.6  37.5 - 51.0 % Final  . MCV 01/15/2014 91  79 - 97 fL Final  . MCH 01/15/2014 30.6  26.6 - 33.0 pg Final  . MCHC 01/15/2014 33.7  31.5 - 35.7 g/dL Final  . RDW 01/15/2014 15.1  12.3 - 15.4 % Final  . Neutrophils Relative % 01/15/2014 50   Final  . Lymphs 01/15/2014 30   Final  . Monocytes 01/15/2014 14   Final  . Eos 01/15/2014 5   Final  . Basos 01/15/2014 1   Final  . Neutrophils Absolute 01/15/2014 2.2  1.4 - 7.0 x10E3/uL Final  . Lymphocytes Absolute 01/15/2014 1.3  0.7 - 3.1 x10E3/uL Final  . Monocytes Absolute 01/15/2014 0.6  0.1 - 0.9 x10E3/uL Final  . Eosinophils Absolute 01/15/2014 0.2  0.0 - 0.4 x10E3/uL Final  . Basophils Absolute 01/15/2014 0.0  0.0 - 0.2 x10E3/uL Final  . Immature Granulocytes 01/15/2014 0   Final    . Immature Grans (Abs) 01/15/2014 0.0  0.0 - 0.1 x10E3/uL Final  . TSH 01/15/2014 1.620  0.450 - 4.500 uIU/mL Final  . Vit D, 25-Hydroxy 01/15/2014 36.0  30.0 - 100.0 ng/mL Final   Comment: Vitamin D deficiency has been defined by the [place] practice guideline as a level of serum 25-OH vitamin D less than 20 ng/mL (1,2). The Endocrine Society went on to further define vitamin D insufficiency as a level between 21 and 29 ng/mL (2). 1. IOM (Institute of Medicine). 2010. Dietary reference    intakes for calcium and D. [place]: The    [employer]. 2. Holick MF, Binkley McDermott, Bischoff-Ferrari HA, et al.    Evaluation, treatment, and prevention of vitamin D    deficiency: an Endocrine Society clinical practice    guideline. JCEM. 2011 Jul; 96(7):1911-30.   Marland Kitchen Glucose 01/15/2014 112* 65 - 99 mg/dL Final  . BUN 01/15/2014 24  8 - 27 mg/dL Final  . Creatinine, Ser 01/15/2014 1.33* 0.76 - 1.27 mg/dL Final  . GFR calc non Af Amer 01/15/2014 54* >59 mL/min/1.73 Final  . GFR calc Af Amer 01/15/2014 62  >59 mL/min/1.73 Final  . BUN/Creatinine Ratio 01/15/2014 18  10 - 22 Final  . Sodium 01/15/2014 140  134 - 144 mmol/L Final  . Potassium 01/15/2014 4.8  3.5 - 5.2 mmol/L Final  . Chloride 01/15/2014 102  97 - 108 mmol/L Final  . CO2 01/15/2014 26  18 - 29 mmol/L Final  . Calcium 01/15/2014 9.0  8.6 - 10.2 mg/dL Final  . Total Protein 01/15/2014 6.8  6.0 - 8.5 g/dL Final  . Albumin 01/15/2014 3.7  3.5 - 4.8 g/dL Final  . Globulin, Total 01/15/2014 3.1  1.5 - 4.5 g/dL Final  . Albumin/Globulin Ratio 01/15/2014 1.2  1.1 - 2.5 Final  . Total Bilirubin 01/15/2014 <0.2  0.0 - 1.2 mg/dL Final  . Alkaline Phosphatase 01/15/2014 132* 39 - 117 IU/L Final  . AST 01/15/2014 20  0 -  40 IU/L Final  . ALT 01/15/2014 16  0 - 44 IU/L Final  . Hgb A1c MFr Bld 01/15/2014 5.6  4.8 - 5.6 % Final   Comment:          Pre-diabetes: 5.7 - 6.4          Diabetes:  >6.4          Glycemic control for adults with diabetes: <7.0   . Est. average glucose Bld gHb Est-m* 01/15/2014 114   Final  . Cholesterol, Total 01/15/2014 173  100 - 199 mg/dL Final  . Triglycerides 01/15/2014 80  0 - 149 mg/dL Final  . HDL 01/15/2014 30* >39 mg/dL Final   Comment: According to ATP-III Guidelines, HDL-C >59 mg/dL is considered a negative risk factor for CHD.   Marland Kitchen VLDL Cholesterol Cal 01/15/2014 16  5 - 40 mg/dL Final  . LDL Calculated 01/15/2014 127* 0 - 99 mg/dL Final  . Chol/HDL Ratio 01/15/2014 5.8* 0.0 - 5.0 ratio units Final   Comment:                                   T. Chol/HDL Ratio                                             Men  Women                               1/2 Avg.Risk  3.4    3.3                                   Avg.Risk  5.0    4.4                                2X Avg.Risk  9.6    7.1                                3X Avg.Risk 23.4   11.0   Office Visit on 11/08/2013  Component Date Value Ref Range Status  . WBC 11/08/2013 6.5  3.4 - 10.8 x10E3/uL Final  . RBC 11/08/2013 3.79* 4.14 - 5.80 x10E6/uL Final  . Hemoglobin 11/08/2013 11.7* 12.6 - 17.7 g/dL Final  . HCT 11/08/2013 35.8* 37.5 - 51.0 % Final  . MCV 11/08/2013 95  79 - 97 fL Final  . MCH 11/08/2013 30.9  26.6 - 33.0 pg Final  . MCHC 11/08/2013 32.7  31.5 - 35.7 g/dL Final  . RDW 11/08/2013 14.8  12.3 - 15.4 % Final  . Neutrophils Relative % 11/08/2013 68   Final  . Lymphs 11/08/2013 16   Final  . Monocytes 11/08/2013 15   Final  . Eos 11/08/2013 1   Final  . Basos 11/08/2013 0   Final  . Neutrophils Absolute 11/08/2013 4.4  1.4 - 7.0 x10E3/uL Final  . Lymphocytes Absolute 11/08/2013 1.0  0.7 - 3.1 x10E3/uL Final  . Monocytes Absolute 11/08/2013 1.0* 0.1 - 0.9 x10E3/uL Final  . Eosinophils Absolute 11/08/2013 0.1  0.0 - 0.4 x10E3/uL  Final  . Basophils Absolute 11/08/2013 0.0  0.0 - 0.2 x10E3/uL Final  . Immature Granulocytes 11/08/2013 0   Final  . Immature Grans (Abs)  11/08/2013 0.0  0.0 - 0.1 x10E3/uL Final  . TSH 11/08/2013 1.150  0.450 - 4.500 uIU/mL Final  . Vit D, 1,25-Dihydroxy 11/08/2013 32.4  19.9 - 79.3 pg/mL Final  . Glucose 11/08/2013 104* 65 - 99 mg/dL Final  . BUN 11/08/2013 20  8 - 27 mg/dL Final  . Creatinine, Ser 11/08/2013 1.35* 0.76 - 1.27 mg/dL Final  . GFR calc non Af Amer 11/08/2013 53* >59 mL/min/1.73 Final  . GFR calc Af Amer 11/08/2013 61  >59 mL/min/1.73 Final  . BUN/Creatinine Ratio 11/08/2013 15  10 - 22 Final  . Sodium 11/08/2013 140  134 - 144 mmol/L Final  . Potassium 11/08/2013 4.3  3.5 - 5.2 mmol/L Final  . Chloride 11/08/2013 101  97 - 108 mmol/L Final  . CO2 11/08/2013 23  18 - 29 mmol/L Final  . Calcium 11/08/2013 8.2* 8.6 - 10.2 mg/dL Final  . Total Protein 11/08/2013 6.0  6.0 - 8.5 g/dL Final  . Albumin 11/08/2013 3.3* 3.5 - 4.8 g/dL Final  . Globulin, Total 11/08/2013 2.7  1.5 - 4.5 g/dL Final  . Albumin/Globulin Ratio 11/08/2013 1.2  1.1 - 2.5 Final  . Total Bilirubin 11/08/2013 0.4  0.0 - 1.2 mg/dL Final  . Alkaline Phosphatase 11/08/2013 183* 39 - 117 IU/L Final  . AST 11/08/2013 22  0 - 40 IU/L Final  . ALT 11/08/2013 21  0 - 44 IU/L Final  . Hgb A1c MFr Bld 11/08/2013 5.9* 4.8 - 5.6 % Final   Comment:          Increased risk for diabetes: 5.7 - 6.4                                   Diabetes: >6.4                                   Glycemic control for adults with diabetes: <7.0  . Est. average glucose Bld gHb Est-m* 11/08/2013 123   Final     Assessment/Plan 1. Diabetes mellitus without complication - Comprehensive metabolic panel; Future - Hemoglobin A1c; Future - Microalbumin, urine; Future  2. Hyperlipidemia - Lipid panel; Future  3. Ankle swelling, unspecified laterality resolved  4. Bilateral low back pain without sciatica stable  5. Vitamin D deficiency corrected  6. Elevated alkaline phosphatase level stable  7. Essential and other specified forms of tremor stable  8. Acute  bronchitis, unspecified organism improving

## 2014-01-18 ENCOUNTER — Telehealth: Payer: Self-pay | Admitting: *Deleted

## 2014-01-18 NOTE — Telephone Encounter (Signed)
Patient brought in [place] Forms to be filled out at his appointment and Dr. Nyoka Cowden filled out and i faxed them to [facility] Fax #: [phone]. Then sent them to be scanned.

## 2014-02-17 ENCOUNTER — Other Ambulatory Visit: Payer: Self-pay | Admitting: Internal Medicine

## 2014-04-29 ENCOUNTER — Other Ambulatory Visit: Payer: Self-pay | Admitting: Internal Medicine

## 2014-04-30 ENCOUNTER — Other Ambulatory Visit: Payer: Self-pay | Admitting: *Deleted

## 2014-04-30 MED ORDER — LORAZEPAM 0.5 MG PO TABS: ORAL_TABLET | ORAL | Status: DC

## 2014-04-30 NOTE — Telephone Encounter (Signed)
[facility]

## 2014-06-01 ENCOUNTER — Encounter (HOSPITAL_COMMUNITY): Payer: Self-pay | Admitting: *Deleted

## 2014-06-01 ENCOUNTER — Encounter (HOSPITAL_COMMUNITY): Payer: Self-pay

## 2014-06-01 ENCOUNTER — Observation Stay (HOSPITAL_COMMUNITY): Payer: Medicare Other

## 2014-06-01 ENCOUNTER — Emergency Department (INDEPENDENT_AMBULATORY_CARE_PROVIDER_SITE_OTHER)
Admission: EM | Admit: 2014-06-01 | Discharge: 2014-06-01 | Disposition: A | Discharge status: Nursing Home (Includes ICF) | Payer: Medicare Other | Source: Home / Self Care | Attending: Family Medicine | Admitting: Family Medicine

## 2014-06-01 ENCOUNTER — Emergency Department (HOSPITAL_COMMUNITY): Payer: Medicare Other

## 2014-06-01 ENCOUNTER — Inpatient Hospital Stay (HOSPITAL_COMMUNITY)
Admission: EM | Admit: 2014-06-01 | Discharge: 2014-06-03 | DRG: 065 | Disposition: A | Discharge status: Home / Self Care | Payer: Medicare Other | Attending: Internal Medicine | Admitting: Internal Medicine

## 2014-06-01 DIAGNOSIS — B029 Zoster without complications: Secondary | ICD-10-CM | POA: Diagnosis present

## 2014-06-01 DIAGNOSIS — Z79899 Other long term (current) drug therapy: Secondary | ICD-10-CM

## 2014-06-01 DIAGNOSIS — Z87442 Personal history of urinary calculi: Secondary | ICD-10-CM

## 2014-06-01 DIAGNOSIS — F17210 Nicotine dependence, cigarettes, uncomplicated: Secondary | ICD-10-CM | POA: Diagnosis present

## 2014-06-01 DIAGNOSIS — K219 Gastro-esophageal reflux disease without esophagitis: Secondary | ICD-10-CM | POA: Diagnosis present

## 2014-06-01 DIAGNOSIS — Z7982 Long term (current) use of aspirin: Secondary | ICD-10-CM

## 2014-06-01 DIAGNOSIS — N182 Chronic kidney disease, stage 2 (mild): Secondary | ICD-10-CM | POA: Diagnosis present

## 2014-06-01 DIAGNOSIS — R748 Abnormal levels of other serum enzymes: Secondary | ICD-10-CM | POA: Diagnosis present

## 2014-06-01 DIAGNOSIS — Z981 Arthrodesis status: Secondary | ICD-10-CM

## 2014-06-01 DIAGNOSIS — M545 Low back pain: Secondary | ICD-10-CM | POA: Diagnosis present

## 2014-06-01 DIAGNOSIS — I10 Essential (primary) hypertension: Secondary | ICD-10-CM | POA: Insufficient documentation

## 2014-06-01 DIAGNOSIS — G451 Carotid artery syndrome (hemispheric): Secondary | ICD-10-CM | POA: Diagnosis not present

## 2014-06-01 DIAGNOSIS — E785 Hyperlipidemia, unspecified: Secondary | ICD-10-CM | POA: Diagnosis not present

## 2014-06-01 DIAGNOSIS — R251 Tremor, unspecified: Secondary | ICD-10-CM | POA: Diagnosis not present

## 2014-06-01 DIAGNOSIS — Z8249 Family history of ischemic heart disease and other diseases of the circulatory system: Secondary | ICD-10-CM

## 2014-06-01 DIAGNOSIS — I63132 Cerebral infarction due to embolism of left carotid artery: Secondary | ICD-10-CM | POA: Diagnosis not present

## 2014-06-01 DIAGNOSIS — E669 Obesity, unspecified: Secondary | ICD-10-CM | POA: Diagnosis present

## 2014-06-01 DIAGNOSIS — I129 Hypertensive chronic kidney disease with stage 1 through stage 4 chronic kidney disease, or unspecified chronic kidney disease: Secondary | ICD-10-CM | POA: Diagnosis present

## 2014-06-01 DIAGNOSIS — F419 Anxiety disorder, unspecified: Secondary | ICD-10-CM | POA: Diagnosis present

## 2014-06-01 DIAGNOSIS — I639 Cerebral infarction, unspecified: Secondary | ICD-10-CM | POA: Insufficient documentation

## 2014-06-01 DIAGNOSIS — H409 Unspecified glaucoma: Secondary | ICD-10-CM | POA: Diagnosis present

## 2014-06-01 DIAGNOSIS — M25473 Effusion, unspecified ankle: Secondary | ICD-10-CM

## 2014-06-01 DIAGNOSIS — Z833 Family history of diabetes mellitus: Secondary | ICD-10-CM

## 2014-06-01 DIAGNOSIS — E1122 Type 2 diabetes mellitus with diabetic chronic kidney disease: Secondary | ICD-10-CM | POA: Diagnosis present

## 2014-06-01 DIAGNOSIS — I6529 Occlusion and stenosis of unspecified carotid artery: Secondary | ICD-10-CM | POA: Insufficient documentation

## 2014-06-01 DIAGNOSIS — N138 Other obstructive and reflux uropathy: Secondary | ICD-10-CM | POA: Diagnosis present

## 2014-06-01 DIAGNOSIS — E559 Vitamin D deficiency, unspecified: Secondary | ICD-10-CM | POA: Diagnosis present

## 2014-06-01 DIAGNOSIS — Z885 Allergy status to narcotic agent status: Secondary | ICD-10-CM

## 2014-06-01 DIAGNOSIS — G4700 Insomnia, unspecified: Secondary | ICD-10-CM | POA: Diagnosis present

## 2014-06-01 DIAGNOSIS — Z823 Family history of stroke: Secondary | ICD-10-CM

## 2014-06-01 DIAGNOSIS — G459 Transient cerebral ischemic attack, unspecified: Secondary | ICD-10-CM

## 2014-06-01 DIAGNOSIS — Z6831 Body mass index (BMI) 31.0-31.9, adult: Secondary | ICD-10-CM

## 2014-06-01 DIAGNOSIS — G250 Essential tremor: Secondary | ICD-10-CM | POA: Diagnosis present

## 2014-06-01 DIAGNOSIS — N401 Enlarged prostate with lower urinary tract symptoms: Secondary | ICD-10-CM | POA: Diagnosis present

## 2014-06-01 LAB — COMPREHENSIVE METABOLIC PANEL
ALK PHOS: 121 U/L — AB (ref 39–117)
ALT: 19 U/L (ref 0–53)
ANION GAP: 8 (ref 5–15)
AST: 24 U/L (ref 0–37)
Albumin: 3.5 g/dL (ref 3.5–5.2)
BUN: 15 mg/dL (ref 6–23)
CHLORIDE: 104 mmol/L (ref 96–112)
CO2: 27 mmol/L (ref 19–32)
Calcium: 8.9 mg/dL (ref 8.4–10.5)
Creatinine, Ser: 1.14 mg/dL (ref 0.50–1.35)
GFR calc non Af Amer: 63 mL/min — ABNORMAL LOW (ref >90)
GFR, EST AFRICAN AMERICAN: 73 mL/min — AB (ref >90)
GLUCOSE: 92 mg/dL (ref 70–99)
POTASSIUM: 4.2 mmol/L (ref 3.5–5.1)
SODIUM: 139 mmol/L (ref 135–145)
Total Bilirubin: 0.5 mg/dL (ref 0.3–1.2)
Total Protein: 7 g/dL (ref 6.0–8.3)

## 2014-06-01 LAB — APTT: aPTT: 46 seconds — ABNORMAL HIGH (ref 24–37)

## 2014-06-01 LAB — CBC
HCT: 43.9 % (ref 39.0–52.0)
HEMOGLOBIN: 14.3 g/dL (ref 13.0–17.0)
MCH: 31.4 pg (ref 26.0–34.0)
MCHC: 32.6 g/dL (ref 30.0–36.0)
MCV: 96.3 fL (ref 78.0–100.0)
Platelets: 177 10*3/uL (ref 150–400)
RBC: 4.56 MIL/uL (ref 4.22–5.81)
RDW: 13.1 % (ref 11.5–15.5)
WBC: 5 10*3/uL (ref 4.0–10.5)

## 2014-06-01 LAB — RAPID URINE DRUG SCREEN, HOSP PERFORMED
AMPHETAMINES: NOT DETECTED
Barbiturates: NOT DETECTED
Benzodiazepines: NOT DETECTED
Cocaine: NOT DETECTED
Opiates: NOT DETECTED
Tetrahydrocannabinol: NOT DETECTED

## 2014-06-01 LAB — GLUCOSE, CAPILLARY
GLUCOSE-CAPILLARY: 78 mg/dL (ref 70–99)
GLUCOSE-CAPILLARY: 95 mg/dL (ref 70–99)

## 2014-06-01 LAB — PROTIME-INR
INR: 1.17 (ref 0.00–1.49)
PROTHROMBIN TIME: 15 s (ref 11.6–15.2)

## 2014-06-01 LAB — I-STAT TROPONIN, ED: Troponin i, poc: 0.01 ng/mL (ref 0.00–0.08)

## 2014-06-01 MED ORDER — ASPIRIN 325 MG PO TABS
325.0000 mg | ORAL_TABLET | Freq: Every day | ORAL | Status: DC
  Administered 2014-06-01 – 2014-06-02 (×2): 325 mg via ORAL
  Filled 2014-06-01 (×2): qty 1

## 2014-06-01 MED ORDER — LORAZEPAM 0.5 MG PO TABS
0.5000 mg | ORAL_TABLET | Freq: Two times a day (BID) | ORAL | Status: DC
  Administered 2014-06-02 – 2014-06-03 (×3): 0.5 mg via ORAL
  Filled 2014-06-01 (×3): qty 1

## 2014-06-01 MED ORDER — NICOTINE POLACRILEX 2 MG MT LOZG
2.0000 mg | LOZENGE | Freq: Three times a day (TID) | OROMUCOSAL | Status: DC
Start: 2014-06-01 — End: 2014-06-01
  Filled 2014-06-01 (×3): qty 1

## 2014-06-01 MED ORDER — LORAZEPAM 2 MG/ML IJ SOLN
1.0000 mg | Freq: Once | INTRAMUSCULAR | Status: AC
  Administered 2014-06-01: 1 mg via INTRAVENOUS
  Filled 2014-06-01: qty 1

## 2014-06-01 MED ORDER — STROKE: EARLY STAGES OF RECOVERY BOOK
Freq: Once | Status: AC
  Administered 2014-06-01: 17:00:00

## 2014-06-01 MED ORDER — TIMOLOL MALEATE 0.25 % OP SOLN
1.0000 [drp] | Freq: Two times a day (BID) | OPHTHALMIC | Status: DC
  Administered 2014-06-02 – 2014-06-03 (×2): 1 [drp] via OPHTHALMIC
  Filled 2014-06-01 (×2): qty 5

## 2014-06-01 MED ORDER — ENOXAPARIN SODIUM 40 MG/0.4ML ~~LOC~~ SOLN
40.0000 mg | SUBCUTANEOUS | Status: DC
  Administered 2014-06-01 – 2014-06-02 (×2): 40 mg via SUBCUTANEOUS
  Filled 2014-06-01 (×2): qty 0.4

## 2014-06-01 MED ORDER — MENTHOL 3 MG MT LOZG
1.0000 | LOZENGE | OROMUCOSAL | Status: DC | PRN
  Administered 2014-06-01: 3 mg via ORAL
  Filled 2014-06-01: qty 9

## 2014-06-01 MED ORDER — ACETAMINOPHEN 500 MG PO TABS
1000.0000 mg | ORAL_TABLET | Freq: Four times a day (QID) | ORAL | Status: DC | PRN
  Administered 2014-06-01 – 2014-06-02 (×3): 1000 mg via ORAL
  Filled 2014-06-01 (×3): qty 2

## 2014-06-01 MED ORDER — ACETAMINOPHEN 325 MG PO TABS
650.0000 mg | ORAL_TABLET | Freq: Once | ORAL | Status: AC
  Administered 2014-06-01: 650 mg via ORAL
  Filled 2014-06-01: qty 2

## 2014-06-01 NOTE — ED Notes (Signed)
Neurology at bedside.

## 2014-06-01 NOTE — ED Notes (Addendum)
Pt states he had about a 45 sec time this morning where he was unable to speak. He also states he had some numbness in his hands yesterday. Pt states he's having blurred vision in his left eye x3 days.

## 2014-06-01 NOTE — ED Notes (Signed)
Admitting at bedside 

## 2014-06-01 NOTE — ED Provider Notes (Signed)
CSN: [id number]     Arrival date & time 06/01/14  1000 History   First MD Initiated Contact with Patient 06/01/14 1002     Chief Complaint  Patient presents with  . Weakness   (Consider location/radiation/quality/duration/timing/severity/associated sxs/prior Treatment) Patient is a 74 y.o. male presenting with neurologic complaint. The history is provided by the patient and the spouse.  Neurologic Problem This is a new problem. The current episode started 3 to 5 hours ago. The problem has been resolved. Associated symptoms include headaches. Associated symptoms comments: Exp aphasia for 45 sec approx 3.5 hr ago. No prior hx. Also sl headache 6/10.Marland Kitchen    Past Medical History  Diagnosis Date  . Unspecified vitamin D deficiency   . Urethral stricture unspecified   . Hypertrophy of prostate with urinary obstruction and other lower urinary tract symptoms (LUTS)   . Type II or unspecified type diabetes mellitus without mention of complication, not stated as uncontrolled   . Anxiety state, unspecified   . 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 Urinary frequency   . Insomnia, unspecified   . Unspecified glaucoma   . 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 Calculus of kidney   . Other and unspecified hyperlipidemia   . Reflux esophagitis   . Lumbago   . Unspecified essential hypertension   . Herpes zoster without mention of complication   . Glaucoma    Past Surgical History  Procedure Laterality Date  . Spinal fusion  12/91,11/91    obtained bone from left lower leg  . Eye surgery  2014    cataract  . Eye surgery  2013    Retina  . Cardiac catheterization  1986    normal, Dr Lia Foyer  . Stress thallium  1994     normal;  . Stretching bladder  07/2007    Dr Risa Grill   Family History  Problem Relation Age of Onset  . Diabetes Mother   . Cancer Mother   . Hypertension Father   . Stroke Father   . Alcohol abuse Brother    History  Substance Use Topics  . Smoking  status: Former Research scientist (life sciences)  . Smokeless tobacco: Not on file  . Alcohol Use: No    Review of Systems  Constitutional: Negative.   Respiratory: Negative.   Cardiovascular: Negative.   Gastrointestinal: Negative.   Genitourinary: Negative.   Neurological: Positive for speech difficulty and headaches. Negative for weakness.    Allergies  Codeine and Demerol  Home Medications   Prior to Admission medications   Medication Sig Start Date End Date Taking? Authorizing Provider  LORazepam (ATIVAN) 0.5 MG tablet Take one tablet by mouth every eight hours as needed for anxiety 04/30/14   Lauree Chandler, NP  nicotine polacrilex (COMMIT) 2 MG lozenge Take 2 mg by mouth as needed for smoking cessation.    Historical Provider, MD  timolol (TIMOPTIC) 0.25 % ophthalmic solution Place 1 drop into both eyes 2 (two) times daily.    Historical Provider, MD   BP 144/79 mmHg  Pulse 58  Temp(Src) 96.4 F (35.8 C) (Oral)  SpO2 96% Physical Exam  Constitutional: He is oriented to person, place, and time. He appears well-developed and well-nourished. No distress.  Eyes: Conjunctivae and EOM are normal. Pupils are equal, round, and reactive to light.  Neck: Trachea normal, normal range of motion and full passive range of motion without pain. Decreased carotid pulses present. No hepatojugular reflux and no JVD present. Carotid  bruit is present.  Cardiovascular: Normal rate, regular rhythm, normal heart sounds and intact distal pulses.   Pulmonary/Chest: Effort normal and breath sounds normal.  Musculoskeletal: Normal range of motion.  Lymphadenopathy:    He has no cervical adenopathy.  Neurological: He is alert and oriented to person, place, and time. No cranial nerve deficit. Coordination normal.  Skin: Skin is warm and dry.  Nursing note and vitals reviewed.   ED Course  Procedures (including critical care time) Labs Review Labs Reviewed - No data to display  Imaging Review No results  found.   MDM   1. TIA involving left internal carotid artery    Sent for eval of brief expressive aphasia sx with left carotid bruit approx 3 hr ago, resolved.    Billy Fischer, MD 06/01/14 1025

## 2014-06-01 NOTE — ED Notes (Signed)
Pt  Had   Had    Episode of  Aphasia    3.5  Hours ago   Which  Lasted    About  45  secs        Got  Better  And     Resolved  And    You  Went  To  Work        Pt  Has  Headache     And     Feels   A  Little  Blurred  Vision          Pt  At this  Time           Is   Awake  And  Alert      And  Oriented        No  Neuro  deficiet  At  This  Time

## 2014-06-01 NOTE — H&P (Signed)
[facility] History and Physical                                                                                    Marcus [name], is a 74 y.o. male  MRN: [id number]   DOB - Sep 21, 1940  Admit Date - 06/01/2014  Outpatient Primary MD for the patient is GREEN, [name], MD  With History of -  Past Medical History  Diagnosis Date  . Unspecified vitamin D deficiency   . Urethral stricture unspecified   . Hypertrophy of prostate with urinary obstruction and other lower urinary tract symptoms (LUTS)   . Type II or unspecified type diabetes mellitus without mention of complication, not stated as uncontrolled   . Anxiety state, unspecified   . 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 Urinary frequency   . Insomnia, unspecified   . Unspecified glaucoma   . 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 Calculus of kidney   . Other and unspecified hyperlipidemia   . Reflux esophagitis   . Lumbago   . Unspecified essential hypertension   . Herpes zoster without mention of complication   . Glaucoma       Past Surgical History  Procedure Laterality Date  . Spinal fusion  12/91,11/91    obtained bone from left lower leg  . Eye surgery  2014    cataract  . Eye surgery  2013    Retina  . Cardiac catheterization  1986    normal, Dr Lia Foyer  . Stress thallium  1994     normal;  . Stretching bladder  07/2007    Dr Risa Grill    in for   Chief Complaint  Patient presents with  . Transient Ischemic Attack     HPI 74 year old male patient relatively healthy with past medical history of chronically elevated alk phosphatase, vitamin D deficiency, dyslipidemia and anxiety disorder. Presented initially to urgent care with the complaint of acute expressive aphasia. The patient reported that he was unable to speak for about 45 seconds to 1 minute, he knew what he wanted to say but just couldn't say it. He had no other neurological symptoms. The symptoms had begun about 3-5 hours prior  to presentation to urgent care. Patient was also having a headache which he attributed to recent high pollen counts. He also reports some vague tingling/numbness in the first and second finger of the right hand on 3/31 that lasted about 30-45 minutes. Currently is asymptomatic.  He was subsequently transfered to [facility] ER for concerns about TIA. On initial evaluation at the Urgent Care the provider questioned a possible left carotid bruit. Stat CT of the head without contrast showed no acute changes only diffuse atrophy with minimal periventricular small vessel disease. Telemetry was without any arrhythmia. EKG revealed sinus bradycardia without any acute ischemic changes and normal QTC. Renal function was normal except for mildly reduced GFR, alkaline phosphatase was 121, troponin was 0.01. CBC was normal. Glucose was 92. Drug screen was negative.  Review of Systems   In addition to the HPI above,  No Fever-chills, myalgias or other constitutional symptoms No problems swallowing food  or Liquids, indigestion/reflux No Chest pain, Cough or Shortness of Breath, palpitations, orthopnea or DOE No Abdominal pain, N/V; no melena or hematochezia, no dark tarry stools, Bowel movements are regular, No dysuria, hematuria or flank pain No new skin rashes, lesions, masses or bruises, No new joints pains-aches No recent weight gain or loss No polyuria, polydypsia or polyphagia,  *A full 10 point Review of Systems was done, except as stated above, all other Review of Systems were negative.  Social History History  Substance Use Topics  . Smoking status: Former Research scientist (life sciences)  . Smokeless tobacco: Not on file  . Alcohol Use: No    Family History Family History  Problem Relation Age of Onset  . Diabetes Mother   . Cancer Mother   . Hypertension Father   . Stroke Father   . Alcohol abuse Brother     Prior to Admission medications   Medication Sig Start Date End Date Taking? Authorizing Provider    acetaminophen (TYLENOL) 500 MG tablet Take 1,000 mg by mouth every 6 (six) hours as needed for mild pain or moderate pain.   Yes Historical Provider, MD  aspirin 81 MG tablet Take 81 mg by mouth daily as needed (stroke like symptoms).   Yes Historical Provider, MD  Cholecalciferol (VITAMIN D PO) Take 1 tablet by mouth daily at 12 noon.   Yes Historical Provider, MD  LORazepam (ATIVAN) 0.5 MG tablet Take one tablet by mouth every eight hours as needed for anxiety 04/30/14  Yes Lauree Chandler, NP  nicotine polacrilex (COMMIT) 2 MG lozenge Take 2 mg by mouth 3 (three) times daily.    Yes Historical Provider, MD  timolol (TIMOPTIC) 0.25 % ophthalmic solution Place 1 drop into both eyes 2 (two) times daily.   Yes Historical Provider, MD    Allergies  Allergen Reactions  . Codeine Other (See Comments)    constipation  . Demerol [Meperidine] Other (See Comments)    Drops blood pressure     Physical Exam  Vitals  Blood pressure 132/79, pulse 54, temperature 98.2 F (36.8 C), temperature source Oral, resp. rate 14, height 5' 7.5" (1.715 m), weight 201 lb 4 oz (91.286 kg), SpO2 98 %.   General:  In no acute distress, appears healthy and well nourished  Psych:  Normal affect, Denies Suicidal or Homicidal ideations, Awake Alert, Oriented X 3. Speech and thought patterns are clear and appropriate, no apparent short term memory deficits  Neuro:   No focal neurological deficits, CN II through XII intact, Strength 5/5 all 4 extremities, Sensation intact all 4 extremities.  ENT:  Ears and Eyes appear Normal, Conjunctivae clear, PER. Moist oral mucosa without erythema or exudates.  Neck:  Supple, No lymphadenopathy appreciated  Respiratory:  Symmetrical chest wall movement, Good air movement bilaterally, CTAB. Room Air  Cardiac:  RRR, No Murmurs, no LE edema noted, no JVD, No carotid bruits, peripheral pulses palpable at 2+  Abdomen:  Positive bowel sounds, Soft, Non tender, Non distended,   No masses appreciated, no obvious hepatosplenomegaly  Skin:  No Cyanosis, Normal Skin Turgor, No Skin Rash or Bruise.  Extremities: Symmetrical without obvious trauma or injury,  no effusions.  Data Review  CBC  Recent Labs Lab 06/01/14 1155  WBC 5.0  HGB 14.3  HCT 43.9  PLT 177  MCV 96.3  MCH 31.4  MCHC 32.6  RDW 13.1    Chemistries   Recent Labs Lab 06/01/14 1155  NA 139  K 4.2  CL 104  CO2 27  GLUCOSE 92  BUN 15  CREATININE 1.14  CALCIUM 8.9  AST 24  ALT 19  ALKPHOS 121*  BILITOT 0.5    estimated creatinine clearance is 64.6 mL/min (by C-G formula based on Cr of 1.14).  No results for input(s): TSH, T4TOTAL, T3FREE, THYROIDAB in the last 72 hours.  Invalid input(s): FREET3  Coagulation profile  Recent Labs Lab 06/01/14 1155  INR 1.17    No results for input(s): DDIMER in the last 72 hours.  Cardiac Enzymes No results for input(s): CKMB, TROPONINI, MYOGLOBIN in the last 168 hours.  Invalid input(s): CK  Invalid input(s): POCBNP  Urinalysis    Component Value Date/Time   COLORURINE YELLOW 09/27/2013 2042   APPEARANCEUR CLOUDY* 09/27/2013 2042   LABSPEC 1.012 09/27/2013 2042   PHURINE 7.0 09/27/2013 2042   GLUCOSEU NEGATIVE 09/27/2013 2042   HGBUR MODERATE* 09/27/2013 2042   BILIRUBINUR NEGATIVE 09/27/2013 2042   KETONESUR NEGATIVE 09/27/2013 2042   PROTEINUR 30* 09/27/2013 2042   UROBILINOGEN 1.0 09/27/2013 2042   NITRITE NEGATIVE 09/27/2013 2042   LEUKOCYTESUR LARGE* 09/27/2013 2042    Imaging results:   Ct Head Wo Contrast  06/01/2014   CLINICAL DATA:  Transient episode of dysarthria earlier today. Frontal headache  EXAM: CT HEAD WITHOUT CONTRAST  TECHNIQUE: Contiguous axial images were obtained from the base of the skull through the vertex without intravenous contrast.  COMPARISON:  September 27, 2013  FINDINGS: Mild diffuse atrophy is stable. There is no intracranial mass, hemorrhage, extra-axial fluid collection or midline shift.  There is minimal small vessel disease in the centra semiovale bilaterally. Gray-white compartments are otherwise normal. No acute infarct apparent. The bony calvarium appears intact. The mastoid air cells are clear. There is slight mucosal thickening in the right maxillary antrum.  IMPRESSION: All diffuse atrophy with rather minimal periventricular small vessel disease. No intracranial mass, hemorrhage, or acute appearing infarct. Minimal right maxillary paranasal sinus disease.   Electronically Signed   By: Lowella Grip III M.D.   On: 06/01/2014 11:37     EKG: Sinus bradycardia with normal QTC and no ischemic changes    Assessment & Plan  Active Problems:   TIA  -Admit to telemetry unit/observation status -Appreciate neurology assistance -Check stat MRI/MRA brain -Check 2-D echocardiogram and carotid duplex -Risk stratification: Check lipid panel and hemoglobin A1c -Begin full dose aspirin for antiplatelet    Hyperlipidemia -Not all medications prior to admission    Diabetes mellitus -This was listed as a problem in the medical record last hemoglobin A1c November 2015 was 5.6 which is not consistent with diabetes -Current glucose 92    CKD (chronic kidney disease), stage II -Likely expected decreased GFR based on advanced age    Essential and other specified forms of tremor -Last evaluated by primary care physician November 2015 listed as stable    Vitamin D deficiency -On vitamin D replacement    Elevated alkaline phosphatase level -Chronically elevated and remained stable      Anxiety disorder -Continue home medications    Former tobacco abuse -Quit 26 years ago but periodically uses nicotine lozenges as a method to continue cessation   DVT Prophylaxis: Lovenox  Family Communication:   Wife at bedside  Code Status:  Full code  Condition: Stable   Time spent in minutes : 60   Celso Granja L. ANP on 06/01/2014 at 2:50 PM  Between 7am to 7pm - Pager -  [phone]  After 7pm go to [email or web address] - password [email or web address]  And look for the night coverage person covering me after hours  [facility]

## 2014-06-01 NOTE — Consult Note (Addendum)
Referring Physician: Ashok Cordia    Chief Complaint: transient expressive aphasia  HPI:                                                                                                                                         Marcus Koller Sr. is an 74 y.o. male who states he is very active.  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 After a while it slowly resolved.  There was no arm or face involvement. He felt fine later that day and went to bed.  HE awoke at 0600 hours. At [place] he tried to talk to his wife and could not get his words out.  This lasted for 20-30 seconds and resolved.  He went to work and later was called by his wife who told him to go to the hospital.  He has had no further symptoms. He denies any previous symptoms such as this and denies any one sided numbness, tingling, weakness.  He is not on antiplatelet therapy at home.  Date last known well: Date: 05/31/2014 Time last known well: Time: 06:45 tPA Given: No: symptoms resolved  Modified Rankin: Rankin Score=0    Past Medical History  Diagnosis Date  . Unspecified vitamin D deficiency   . Urethral stricture unspecified   . Hypertrophy of prostate with urinary obstruction and other lower urinary tract symptoms (LUTS)   . Type II or unspecified type diabetes mellitus without mention of complication, not stated as uncontrolled   . Anxiety state, unspecified   . 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 Urinary frequency   . Insomnia, unspecified   . Unspecified glaucoma   . 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 Calculus of kidney   . Other and unspecified hyperlipidemia   . Reflux esophagitis   . Lumbago   . Unspecified essential hypertension   . Herpes zoster without mention of complication   . Glaucoma     Past Surgical History  Procedure Laterality Date  . Spinal fusion  12/91,11/91    obtained bone from left lower leg  . Eye  surgery  2014    cataract  . Eye surgery  2013    Retina  . Cardiac catheterization  1986    normal, Dr Lia Foyer  . Stress thallium  1994     normal;  . Stretching bladder  07/2007    Dr Risa Grill    Family History  Problem Relation Age of Onset  . Diabetes Mother   . Cancer Mother   . Hypertension Father   . Stroke Father   . Alcohol abuse Brother    Social History:  reports that he has quit smoking. He does not have any smokeless tobacco history on file. He reports that he does not drink alcohol or use  illicit drugs.  Allergies:  Allergies  Allergen Reactions  . Codeine Other (See Comments)    constipation  . Demerol [Meperidine] Other (See Comments)    Drops blood pressure     Medications:                                                                                                                           No current facility-administered medications for this encounter.   Current Outpatient Prescriptions  Medication Sig Dispense Refill  . acetaminophen (TYLENOL) 500 MG tablet Take 1,000 mg by mouth every 6 (six) hours as needed for mild pain or moderate pain.    Marland Kitchen aspirin 81 MG tablet Take 81 mg by mouth daily as needed (stroke like symptoms).    . Cholecalciferol (VITAMIN D PO) Take 1 tablet by mouth daily at 12 noon.    Marland Kitchen LORazepam (ATIVAN) 0.5 MG tablet Take one tablet by mouth every eight hours as needed for anxiety 90 tablet 1  . nicotine polacrilex (COMMIT) 2 MG lozenge Take 2 mg by mouth 3 (three) times daily.     . timolol (TIMOPTIC) 0.25 % ophthalmic solution Place 1 drop into both eyes 2 (two) times daily.       ROS:                                                                                                                                       History obtained from the patient  General ROS: negative for - chills, fatigue, fever, night sweats, weight gain or weight loss Psychological ROS: negative for - behavioral disorder, hallucinations, memory  difficulties, mood swings or suicidal ideation Ophthalmic ROS: negative for - blurry vision, double vision, eye pain or loss of vision ENT ROS: negative for - epistaxis, nasal discharge, oral lesions, sore throat, tinnitus or vertigo Allergy and Immunology ROS: negative for - hives or itchy/watery eyes Hematological and Lymphatic ROS: negative for - bleeding problems, bruising or swollen lymph nodes Endocrine ROS: negative for - galactorrhea, hair pattern changes, polydipsia/polyuria or temperature intolerance Respiratory ROS: negative for - cough, hemoptysis, shortness of breath or wheezing Cardiovascular ROS: negative for - chest pain, dyspnea on exertion, edema or irregular heartbeat Gastrointestinal ROS: negative for - abdominal pain, diarrhea, hematemesis, nausea/vomiting or stool incontinence Genito-Urinary ROS: negative for - dysuria, hematuria, incontinence or urinary frequency/urgency Musculoskeletal ROS: negative for - joint  swelling or muscular weakness Neurological ROS: as noted in HPI Dermatological ROS: negative for rash and skin lesion changes  Neurologic Examination:                                                                                                      Blood pressure 134/81, pulse 55, temperature 98.2 F (36.8 C), temperature source Oral, resp. rate 16, height 5' 7.5" (1.715 m), weight 91.286 kg (201 lb 4 oz), SpO2 100 %.  HEENT-  Normocephalic, no lesions, without obvious abnormality.  Normal external eye and conjunctiva.  Normal TM's bilaterally.  Normal auditory canals and external ears. Normal external nose, mucus membranes and septum.  Normal pharynx. Cardiovascular- S1, S2 normal, pulses palpable throughout   Lungs- chest clear, no wheezing, rales, normal symmetric air entry Abdomen- normal findings: bowel sounds normal Extremities- no edema Lymph-no adenopathy palpable Musculoskeletal-no joint tenderness, deformity or swelling Skin-warm and dry, no  hyperpigmentation, vitiligo, or suspicious lesions  Neurological Examination Mental Status: Alert, oriented, thought content appropriate.  Speech fluent without evidence of aphasia.  Able to follow 3 step commands without difficulty. Cranial Nerves: II: Discs flat bilaterally; Visual fields grossly normal, pupils equal, round, reactive to light and accommodation III,IV, VI: ptosis not present, extra-ocular motions intact bilaterally V,VII: smile symmetric, facial light touch sensation normal bilaterally VIII: hearing normal bilaterally IX,X: uvula rises symmetrically XI: bilateral shoulder shrug XII: midline tongue extension Motor: Right : Upper extremity   5/5    Left:     Upper extremity   5/5  Lower extremity   5/5     Lower extremity   5/5 Tone and bulk:normal tone throughout; no atrophy noted Sensory: Pinprick and light touch intact throughout, bilaterally Deep Tendon Reflexes: 2+ and symmetric throughout UE no KJ or AJ Plantars: Right: downgoing   Left: downgoing Cerebellar: normal finger-to-nose and normal heel-to-shin test Gait: not tested due to multiple leads.    Lab Results: Basic Metabolic Panel:  Recent Labs Lab 06/01/14 1155  NA 139  K 4.2  CL 104  CO2 27  GLUCOSE 92  BUN 15  CREATININE 1.14  CALCIUM 8.9    Liver Function Tests:  Recent Labs Lab 06/01/14 1155  AST 24  ALT 19  ALKPHOS 121*  BILITOT 0.5  PROT 7.0  ALBUMIN 3.5   No results for input(s): LIPASE, AMYLASE in the last 168 hours. No results for input(s): AMMONIA in the last 168 hours.  CBC:  Recent Labs Lab 06/01/14 1155  WBC 5.0  HGB 14.3  HCT 43.9  MCV 96.3  PLT 177    Cardiac Enzymes: No results for input(s): CKTOTAL, CKMB, CKMBINDEX, TROPONINI in the last 168 hours.  Lipid Panel: No results for input(s): CHOL, TRIG, HDL, CHOLHDL, VLDL, LDLCALC in the last 168 hours.  CBG: No results for input(s): GLUCAP in the last 168 hours.  Microbiology: No results found for  this or any previous visit.  Coagulation Studies:  Recent Labs  06/01/14 1155  LABPROT 15.0  INR 1.17    Imaging: Ct Head Wo Contrast  06/01/2014  CLINICAL DATA:  Transient episode of dysarthria earlier today. Frontal headache  EXAM: CT HEAD WITHOUT CONTRAST  TECHNIQUE: Contiguous axial images were obtained from the base of the skull through the vertex without intravenous contrast.  COMPARISON:  September 27, 2013  FINDINGS: Mild diffuse atrophy is stable. There is no intracranial mass, hemorrhage, extra-axial fluid collection or midline shift. There is minimal small vessel disease in the centra semiovale bilaterally. Gray-white compartments are otherwise normal. No acute infarct apparent. The bony calvarium appears intact. The mastoid air cells are clear. There is slight mucosal thickening in the right maxillary antrum.  IMPRESSION: All diffuse atrophy with rather minimal periventricular small vessel disease. No intracranial mass, hemorrhage, or acute appearing infarct. Minimal right maxillary paranasal sinus disease.   Electronically Signed   By: Lowella Grip III M.D.   On: 06/01/2014 11:37    Etta Quill PA-C [facility] [phone]  06/01/2014, 1:34 PM    Patient seen and examined.  Clinical course and management discussed.  Necessary edits performed.  I agree with the above.  Assessment and plan of care developed and discussed below.      Assessment: 74 y.o. male presenting after a short episode of expressive aphasia.  Patient now at baseline.  Although with a history of diabetes his last A1c was 5.6.  Last LDL was 127.  Head CT personally reviewed and shows no acute changes.  Concern is for TIA.  Further work up recommended.    Stroke Risk Factors - diabetes mellitus and hyperlipidemia   Recommendations: 1. HgbA1c, fasting lipid panel 2. MRI, MRA  of the brain without contrast.  Patient has had back surgery.  Will need clearance.   3. Statin initiation 4.  Echocardiogram 5. Carotid dopplers 6. Prophylactic therapy-Antiplatelet med: Aspirin - dose 325mg  daily 7. NPO until RN stroke swallow screen 8. Telemetry monitoring 9. Frequent neuro checks  Alexis Goodell, MD [facility] [phone]  06/01/2014  2:15 PM

## 2014-06-01 NOTE — ED Provider Notes (Signed)
CSN: [id number]     Arrival date & time 06/01/14  1043 History   First MD Initiated Contact with Patient 06/01/14 1109     Chief Complaint  Patient presents with  . Transient Ischemic Attack     (Consider location/radiation/quality/duration/timing/severity/associated sxs/prior Treatment) HPI Comments: Patient presents today with a chief complaint expressive aphasia.  He reports that this morning he had an episode of expressive aphasia that lasted approximately 45 seconds and then resolved completely.  He also reports that he had an episode yesterday where the 2nd and 3rd fingers went numb for approximately 25-30 minutes and then resolved completely.  He reports that at this time he is having a headache, which has been present for the past 3 days.  He reports that his headache feels like a sinus headache.  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  Denies double vision.  He denies chest pain, SOB, weakness, difficulty swallowing, facial asymmetry, dizziness, or syncope.  He denies any numbness or aphasia at this time.  He denies history of TIA or CVA.  He denies history of HTN, DM, or Hyperlipidemia.  He reports that he quit smoking 28 years ago.  The history is provided by the patient.    Past Medical History  Diagnosis Date  . Unspecified vitamin D deficiency   . Urethral stricture unspecified   . Hypertrophy of prostate with urinary obstruction and other lower urinary tract symptoms (LUTS)   . Type II or unspecified type diabetes mellitus without mention of complication, not stated as uncontrolled   . Anxiety state, unspecified   . 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 Urinary frequency   . Insomnia, unspecified   . Unspecified glaucoma   . 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 Calculus of kidney   . Other and unspecified hyperlipidemia   . Reflux esophagitis   . Lumbago   .  Unspecified essential hypertension   . Herpes zoster without mention of complication   . Glaucoma    Past Surgical History  Procedure Laterality Date  . Spinal fusion  12/91,11/91    obtained bone from left lower leg  . Eye surgery  2014    cataract  . Eye surgery  2013    Retina  . Cardiac catheterization  1986    normal, Dr Lia Foyer  . Stress thallium  1994     normal;  . Stretching bladder  07/2007    Dr Risa Grill   Family History  Problem Relation Age of Onset  . Diabetes Mother   . Cancer Mother   . Hypertension Father   . Stroke Father   . Alcohol abuse Brother    History  Substance Use Topics  . Smoking status: Former Research scientist (life sciences)  . Smokeless tobacco: Not on file  . Alcohol Use: No    Review of Systems  All other systems reviewed and are negative.     Allergies  Codeine and Demerol  Home Medications   Prior to Admission medications   Medication Sig Start Date End Date Taking? Authorizing Provider  acetaminophen (TYLENOL) 500 MG tablet Take 1,000 mg by mouth every 6 (six) hours as needed for mild pain or moderate pain.   Yes Historical Provider, MD  aspirin 81 MG tablet Take 81 mg by mouth daily as needed (stroke like symptoms).   Yes Historical Provider,  MD  Cholecalciferol (VITAMIN D PO) Take 1 tablet by mouth daily at 12 noon.   Yes Historical Provider, MD  LORazepam (ATIVAN) 0.5 MG tablet Take one tablet by mouth every eight hours as needed for anxiety 04/30/14  Yes Lauree Chandler, NP  nicotine polacrilex (COMMIT) 2 MG lozenge Take 2 mg by mouth 3 (three) times daily.    Yes Historical Provider, MD  timolol (TIMOPTIC) 0.25 % ophthalmic solution Place 1 drop into both eyes 2 (two) times daily.   Yes Historical Provider, MD   BP 121/81 mmHg  Pulse 54  Temp(Src) 98 F (36.7 C) (Oral)  Resp 16  Ht 5' 7.5" (1.715 m)  Wt 201 lb 4 oz (91.286 kg)  BMI 31.04 kg/m2  SpO2 98% Physical Exam  Constitutional: He appears well-developed and well-nourished.  HENT:   Head: Normocephalic and atraumatic.  Mouth/Throat: Oropharynx is clear and moist.  Eyes: EOM are normal. Pupils are equal, round, and reactive to light.  Neck: Normal range of motion. Neck supple.  Cardiovascular: Normal rate, regular rhythm and normal heart sounds.   Pulses:      Dorsalis pedis pulses are 2+ on the right side, and 2+ on the left side.  Pulmonary/Chest: Effort normal and breath sounds normal.  Abdominal: Soft. Bowel sounds are normal.  Musculoskeletal: Normal range of motion.  Neurological: He is alert. He has normal strength. No cranial nerve deficit or sensory deficit. Coordination and gait normal.  Skin: Skin is warm and dry.  Psychiatric: He has a normal mood and affect.  Nursing note and vitals reviewed.   ED Course  Procedures (including critical care time) Labs Review Labs Reviewed  URINE RAPID DRUG SCREEN (HOSP PERFORMED)  CBC  COMPREHENSIVE METABOLIC PANEL  PROTIME-INR  APTT  I-STAT TROPOININ, ED    Imaging Review Ct Head Wo Contrast  06/01/2014   CLINICAL DATA:  Transient episode of dysarthria earlier today. Frontal headache  EXAM: CT HEAD WITHOUT CONTRAST  TECHNIQUE: Contiguous axial images were obtained from the base of the skull through the vertex without intravenous contrast.  COMPARISON:  September 27, 2013  FINDINGS: Mild diffuse atrophy is stable. There is no intracranial mass, hemorrhage, extra-axial fluid collection or midline shift. There is minimal small vessel disease in the centra semiovale bilaterally. Gray-white compartments are otherwise normal. No acute infarct apparent. The bony calvarium appears intact. The mastoid air cells are clear. There is slight mucosal thickening in the right maxillary antrum.  IMPRESSION: All diffuse atrophy with rather minimal periventricular small vessel disease. No intracranial mass, hemorrhage, or acute appearing infarct. Minimal right maxillary paranasal sinus disease.   Electronically Signed   By: Lowella Grip  III M.D.   On: 06/01/2014 11:37     EKG Interpretation   Date/Time:  Friday June 01 2014 11:35:44 EDT Ventricular Rate:  54 PR Interval:  144 QRS Duration: 83 QT Interval:  439 QTC Calculation: 416 R Axis:   65 Text Interpretation:  Sinus bradycardia Abnormal R-wave progression, early  transition Nonspecific ST abnormality Confirmed by Ashok Cordia  MD, Lennette Bihari  ([id number]) on 06/01/2014 12:00:27 PM     12:28 PM Discussed with Dr Doy Mince Neurology.  She reports that she will come see the patient in the ED. 1:59 PM Discussed with Neurology who has been down to see the patient.  They recommend admission to Hospitalist for TIA work up.   2:09 PM Discussed with Bryson Ha with [facility].  She reports that she will be down to see the patient  in a couple of minutes. MDM   Final diagnoses:  None   Patient presents today after an episode of expressive aphasia that occurred this morning for approximately 45 seconds and has since resolved.  Patient with a normal neurological exam.  No acute changes on CT head.  Labs unremarkable.  Concern for TIA.  Neurology consulted and evaluated the patient in the ED.  Neurology recommended admission to hospitalist for TIA work up.  Patient admitted to [place], PA-C 06/01/14 [place], MD 06/01/14 2041

## 2014-06-01 NOTE — ED Notes (Signed)
Pt. Sent to [place] from urgent care . Pt. Had an episode of aphasia this am lasting aboutg 45 seconds and resolved.  Pt. Went to work and then his wife called him at work an suggested that he come home and they go get checked out.  Presently he has a headache and fatique.  No neuro deficits.   GCS 15.  Pt. Denies any n/v .  Vision is slighlty blurred, he has glaucoma.  No slurred speech noted.  Pt. Having allergy symptoms the last few nights.

## 2014-06-02 ENCOUNTER — Inpatient Hospital Stay (HOSPITAL_COMMUNITY): Payer: Medicare Other

## 2014-06-02 DIAGNOSIS — M25473 Effusion, unspecified ankle: Secondary | ICD-10-CM | POA: Diagnosis not present

## 2014-06-02 DIAGNOSIS — G451 Carotid artery syndrome (hemispheric): Secondary | ICD-10-CM | POA: Diagnosis not present

## 2014-06-02 DIAGNOSIS — I10 Essential (primary) hypertension: Secondary | ICD-10-CM | POA: Insufficient documentation

## 2014-06-02 DIAGNOSIS — R748 Abnormal levels of other serum enzymes: Secondary | ICD-10-CM | POA: Diagnosis present

## 2014-06-02 DIAGNOSIS — I639 Cerebral infarction, unspecified: Secondary | ICD-10-CM | POA: Diagnosis not present

## 2014-06-02 DIAGNOSIS — M545 Low back pain: Secondary | ICD-10-CM | POA: Diagnosis present

## 2014-06-02 DIAGNOSIS — G4700 Insomnia, unspecified: Secondary | ICD-10-CM | POA: Diagnosis present

## 2014-06-02 DIAGNOSIS — E559 Vitamin D deficiency, unspecified: Secondary | ICD-10-CM | POA: Diagnosis present

## 2014-06-02 DIAGNOSIS — E785 Hyperlipidemia, unspecified: Secondary | ICD-10-CM | POA: Diagnosis present

## 2014-06-02 DIAGNOSIS — Z7982 Long term (current) use of aspirin: Secondary | ICD-10-CM | POA: Diagnosis not present

## 2014-06-02 DIAGNOSIS — I6529 Occlusion and stenosis of unspecified carotid artery: Secondary | ICD-10-CM | POA: Insufficient documentation

## 2014-06-02 DIAGNOSIS — E669 Obesity, unspecified: Secondary | ICD-10-CM | POA: Diagnosis present

## 2014-06-02 DIAGNOSIS — J069 Acute upper respiratory infection, unspecified: Secondary | ICD-10-CM | POA: Diagnosis not present

## 2014-06-02 DIAGNOSIS — Z833 Family history of diabetes mellitus: Secondary | ICD-10-CM | POA: Diagnosis not present

## 2014-06-02 DIAGNOSIS — I6522 Occlusion and stenosis of left carotid artery: Secondary | ICD-10-CM

## 2014-06-02 DIAGNOSIS — Z79899 Other long term (current) drug therapy: Secondary | ICD-10-CM | POA: Diagnosis not present

## 2014-06-02 DIAGNOSIS — Z87442 Personal history of urinary calculi: Secondary | ICD-10-CM | POA: Diagnosis not present

## 2014-06-02 DIAGNOSIS — N182 Chronic kidney disease, stage 2 (mild): Secondary | ICD-10-CM

## 2014-06-02 DIAGNOSIS — H409 Unspecified glaucoma: Secondary | ICD-10-CM | POA: Diagnosis present

## 2014-06-02 DIAGNOSIS — E1122 Type 2 diabetes mellitus with diabetic chronic kidney disease: Secondary | ICD-10-CM | POA: Diagnosis present

## 2014-06-02 DIAGNOSIS — K219 Gastro-esophageal reflux disease without esophagitis: Secondary | ICD-10-CM | POA: Diagnosis present

## 2014-06-02 DIAGNOSIS — N138 Other obstructive and reflux uropathy: Secondary | ICD-10-CM | POA: Diagnosis present

## 2014-06-02 DIAGNOSIS — Z885 Allergy status to narcotic agent status: Secondary | ICD-10-CM | POA: Diagnosis not present

## 2014-06-02 DIAGNOSIS — Z981 Arthrodesis status: Secondary | ICD-10-CM | POA: Diagnosis not present

## 2014-06-02 DIAGNOSIS — Z823 Family history of stroke: Secondary | ICD-10-CM | POA: Diagnosis not present

## 2014-06-02 DIAGNOSIS — G250 Essential tremor: Secondary | ICD-10-CM | POA: Diagnosis present

## 2014-06-02 DIAGNOSIS — Z8249 Family history of ischemic heart disease and other diseases of the circulatory system: Secondary | ICD-10-CM | POA: Diagnosis not present

## 2014-06-02 DIAGNOSIS — Z6831 Body mass index (BMI) 31.0-31.9, adult: Secondary | ICD-10-CM | POA: Diagnosis not present

## 2014-06-02 DIAGNOSIS — I63132 Cerebral infarction due to embolism of left carotid artery: Secondary | ICD-10-CM | POA: Diagnosis present

## 2014-06-02 DIAGNOSIS — M7989 Other specified soft tissue disorders: Secondary | ICD-10-CM | POA: Diagnosis not present

## 2014-06-02 DIAGNOSIS — F17210 Nicotine dependence, cigarettes, uncomplicated: Secondary | ICD-10-CM | POA: Diagnosis present

## 2014-06-02 DIAGNOSIS — B029 Zoster without complications: Secondary | ICD-10-CM | POA: Diagnosis present

## 2014-06-02 DIAGNOSIS — G459 Transient cerebral ischemic attack, unspecified: Secondary | ICD-10-CM | POA: Diagnosis not present

## 2014-06-02 DIAGNOSIS — I129 Hypertensive chronic kidney disease with stage 1 through stage 4 chronic kidney disease, or unspecified chronic kidney disease: Secondary | ICD-10-CM | POA: Diagnosis present

## 2014-06-02 DIAGNOSIS — F419 Anxiety disorder, unspecified: Secondary | ICD-10-CM | POA: Diagnosis present

## 2014-06-02 DIAGNOSIS — N401 Enlarged prostate with lower urinary tract symptoms: Secondary | ICD-10-CM | POA: Diagnosis present

## 2014-06-02 LAB — LIPID PANEL
CHOLESTEROL: 167 mg/dL (ref 0–200)
HDL: 23 mg/dL — AB (ref >39)
LDL CALC: 126 mg/dL — AB (ref 0–99)
TRIGLYCERIDES: 91 mg/dL (ref <150)
Total CHOL/HDL Ratio: 7.3 RATIO
VLDL: 18 mg/dL (ref 0–40)

## 2014-06-02 LAB — RAPID URINE DRUG SCREEN, HOSP PERFORMED
AMPHETAMINES: NOT DETECTED
Barbiturates: NOT DETECTED
Benzodiazepines: NOT DETECTED
Cocaine: NOT DETECTED
Opiates: NOT DETECTED
Tetrahydrocannabinol: NOT DETECTED

## 2014-06-02 MED ORDER — ATORVASTATIN CALCIUM 40 MG PO TABS
40.0000 mg | ORAL_TABLET | Freq: Every day | ORAL | Status: DC
  Administered 2014-06-02: 40 mg via ORAL
  Filled 2014-06-02: qty 1

## 2014-06-02 MED ORDER — CLOPIDOGREL BISULFATE 75 MG PO TABS
75.0000 mg | ORAL_TABLET | Freq: Every day | ORAL | Status: DC
  Administered 2014-06-03: 75 mg via ORAL
  Filled 2014-06-02: qty 1

## 2014-06-02 MED ORDER — IOHEXOL 350 MG/ML SOLN
50.0000 mL | Freq: Once | INTRAVENOUS | Status: AC | PRN
  Administered 2014-06-02: 50 mL via INTRAVENOUS

## 2014-06-02 MED ORDER — ASPIRIN EC 81 MG PO TBEC
81.0000 mg | DELAYED_RELEASE_TABLET | Freq: Every day | ORAL | Status: DC
  Administered 2014-06-03: 81 mg via ORAL
  Filled 2014-06-02: qty 1

## 2014-06-02 NOTE — Progress Notes (Signed)
VASCULAR LAB PRELIMINARY  PRELIMINARY  PRELIMINARY  PRELIMINARY  Carotid Dopplers completed.    Preliminary report:  1-39% right ICA stenosis.  40-59% left ICA stenosis, lowest end of scale. Vertebral artery flow is antegrade.  Ileen Kahre, RVT 06/02/2014, 10:20 AM

## 2014-06-02 NOTE — Progress Notes (Signed)
UR completed 

## 2014-06-02 NOTE — Progress Notes (Signed)
STROKE TEAM PROGRESS NOTE   HISTORY Marcus Tabb Sr. is a 74 y.o. male who states he is very active. 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 After a while it slowly resolved. There was no arm or face involvement. He felt fine later that day and went to bed. HE awoke at 0600 hours. At [place] he tried to talk to his wife and could not get his words out. This lasted for 20-30 seconds and resolved. He went to work and later was called by his wife who told him to go to the hospital. He has had no further symptoms. He denies any previous symptoms such as this and denies any one sided numbness, tingling, weakness. He is not on antiplatelet therapy at home.  Date last known well: Date: 05/31/2014 Time last known well: Time: 06:45 tPA Given: No: symptoms resolved  Modified Rankin: Rankin Score=0  SUBJECTIVE (INTERVAL HISTORY) No family members present. The patient states that he is back to baseline. Deficits seem to have resolved. Dr Erlinda Hong discussed stroke findings and plans for further workup.   OBJECTIVE Temp:  [96.4 F (35.8 C)-98.2 F (36.8 C)] 97.6 F (36.4 C) (04/02 0641) Pulse Rate:  [51-58] 55 (04/02 0641) Cardiac Rhythm:  [-] Sinus bradycardia;Normal sinus rhythm (04/01 2000) Resp:  [14-20] 18 (04/02 0641) BP: (102-144)/(59-81) 106/59 mmHg (04/02 0641) SpO2:  [96 %-100 %] 98 % (04/02 0641) Weight:  [91.286 kg (201 lb 4 oz)] 91.286 kg (201 lb 4 oz) (04/01 1528)   Recent Labs Lab 06/01/14 1742 06/01/14 2204  GLUCAP 78 95    Recent Labs Lab 06/01/14 1155  NA 139  K 4.2  CL 104  CO2 27  GLUCOSE 92  BUN 15  CREATININE 1.14  CALCIUM 8.9    Recent Labs Lab 06/01/14 1155  AST 24  ALT 19  ALKPHOS 121*  BILITOT 0.5  PROT 7.0  ALBUMIN 3.5    Recent Labs Lab 06/01/14 1155  WBC 5.0  HGB 14.3  HCT 43.9  MCV 96.3  PLT 177   No results for input(s): CKTOTAL, CKMB, CKMBINDEX,  TROPONINI in the last 168 hours.  Recent Labs  06/01/14 1155  LABPROT 15.0  INR 1.17   No results for input(s): COLORURINE, LABSPEC, PHURINE, GLUCOSEU, HGBUR, BILIRUBINUR, KETONESUR, PROTEINUR, UROBILINOGEN, NITRITE, LEUKOCYTESUR in the last 72 hours.  Invalid input(s): APPERANCEUR     Component Value Date/Time   CHOL 167 06/02/2014 0544   CHOL 173 01/15/2014 0848   TRIG 91 06/02/2014 0544   HDL 23* 06/02/2014 0544   HDL 30* 01/15/2014 0848   CHOLHDL 7.3 06/02/2014 0544   CHOLHDL 5.8* 01/15/2014 0848   VLDL 18 06/02/2014 0544   LDLCALC 126* 06/02/2014 0544   LDLCALC 127* 01/15/2014 0848   Lab Results  Component Value Date   HGBA1C 5.6 01/15/2014      Component Value Date/Time   LABOPIA NONE DETECTED 06/02/2014 0544   COCAINSCRNUR NONE DETECTED 06/02/2014 0544   LABBENZ NONE DETECTED 06/02/2014 0544   AMPHETMU NONE DETECTED 06/02/2014 0544   THCU NONE DETECTED 06/02/2014 0544   LABBARB NONE DETECTED 06/02/2014 0544    No results for input(s): ETH in the last 168 hours.  I have personally reviewed the radiological images below and agree with the radiology interpretations.  Ct Head Wo Contrast 06/01/2014    All diffuse atrophy with rather minimal periventricular small vessel disease. No intracranial mass, hemorrhage,  or acute appearing infarct. Minimal right maxillary paranasal sinus disease.      Mri and Mra Head Wo Contrast 06/01/2014    Two 1 cm areas of acute infarction in the left hemisphere consistent with embolic disease in the left middle cerebral artery territory, located at the posterior parietal region and parietal occipital junction region.  No hemorrhage or mass effect.   Normal MR angiography of the large and medium size vessels.     2D echo - - Procedure narrative: Transthoracic echocardiography. Image quality was poor. The study was technically difficult, as a result of poor acoustic windows. - Left ventricle: The cavity size was normal. There was  mild concentric hypertrophy. Systolic function was normal. The estimated ejection fraction was in the range of 60% to 65%. Images were inadequate for LV wall motion assessment. Doppler parameters are consistent with abnormal left ventricular relaxation (grade 1 diastolic dysfunction). - Mitral valve: Mildly calcified annulus. Normal thickness leaflets . - Left atrium: The atrium was mildly dilated. - Right atrium: The atrium was mildly dilated.  CUS - 1-39% right ICA stenosis. 40-59% left ICA stenosis, lowest end of scale. Vertebral artery flow is antegrade.  LE venous doppler - pending  CTA neck - Motion degraded exam. 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 Numerous serial stenoses, most on the order of 50%. Vessel regains a normal appearance distal to the bulb. Mild atherosclerotic disease at the right carotid bifurcation without stenosis.  PHYSICAL EXAM  Temp:  [97.5 F (36.4 C)-97.7 F (36.5 C)] 97.7 F (36.5 C) (04/02 2116) Pulse Rate:  [55-68] 57 (04/02 2116) Resp:  [18] 18 (04/02 2116) BP: (101-165)/(59-86) 165/86 mmHg (04/02 2116) SpO2:  [97 %-100 %] 100 % (04/02 2116)  General - Well nourished, well developed, in no apparent distress.  Ophthalmologic - Sharp disc margins OU.  Cardiovascular - Regular rate and rhythm with no murmur.  Mental Status -  Level of arousal and orientation to time, place, and person were intact. Language including expression, naming, repetition, comprehension was assessed and found intact. Fund of Knowledge was assessed and was intact.  Cranial Nerves II - XII - II - Visual field intact OU. III, IV, VI - Extraocular movements intact. V - Facial sensation intact bilaterally. VII - Facial movement intact bilaterally. VIII - Hearing & vestibular intact bilaterally. X - Palate elevates symmetrically. XI - Chin turning & shoulder  shrug intact bilaterally. XII - Tongue protrusion intact.  Motor Strength - The patient's strength was normal in all extremities and pronator drift was absent.  Bulk was normal and fasciculations were absent.   Motor Tone - Muscle tone was assessed at the neck and appendages and was normal.  Reflexes - The patient's reflexes were 1+ in all extremities and he had no pathological reflexes.  Sensory - Light touch, temperature/pinprick were assessed and were symmetrical.    Coordination - The patient had normal movements in the hands and feet with no ataxia or dysmetria.  Tremor was absent.  Gait and Station - The patient's transfers, posture, gait, station, and turns were observed as normal.  ASSESSMENT/PLAN Mr. Marcus [name] Sr. is a 74 y.o. male with history of  diabetes mellitus, hyperlipidemia, and hypertension presenting with transient aphasia and left hand numbness. He did not receive IV t-PA due to resolution of deficits.   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  Resultant  resolution of deficits  MRI  as above  MRA  Normal  CTA of Neck - left ICA significant plaque formation and stenosis  Carotid Doppler - 40-59% left ICA stenosis.  Lower extremity Dopplers - pending  2D Echo - EF 60-65%. No cardiac source of emboli identified.  LDL 126, not at goal  HgbA1c pending  Lovenox for VTE prophylaxis  Diet regular Room service appropriate?: Yes; Fluid consistency:: Thin  aspirin 81 mg orally every day prior to admission, now on aspirin 325 mg orally every day.   Patient counseled to be compliant with his antithrombotic medications  Ongoing aggressive stroke risk factor management  Therapy recommendations: Pending  Disposition:  Pending  Carotid stenosis  CUS showed left ICA 40-59% stenosis  CTA neck showed significant left ICA plaque formation and stenosis.  Recommend vascular surgery  consult  Recommend ASA and plavix dural antiplatelet for 3 months and then plavix alone.  Hypertension  Home meds: No antihypertensive medications prior to admission  Blood pressure low at times. Permissive hypertension (OK if <220/120) for 24-48 hours post stroke and then gradually normalized within 5-7 days.  Hyperlipidemia  Home meds: No lipid lowering medications prior to admission  LDL 126, goal < 70  Add Lipitor 40 mg daily  Continue statin at discharge  Diabetes  HgbA1c pending, goal < 7.0  Controlled  Tobacco abuse  Current smoker  Smoking cessation counseling provided  Pt is willing to quit  Other Stroke Risk Factors  Advanced age  Obesity, Body mass index is 31.51 kg/(m^2).   Family hx stroke (father)  Other Active Problems    Other Pertinent History    Hospital day # [place] PA-C [facility] Pager [phone] 06/02/2014, 3:00 PM  I, the attending vascular neurologist, have personally obtained a history, examined the patient, evaluated laboratory data, individually viewed imaging studies and agree with radiology interpretations. I also obtained additional history from pt's wife via the phone. I also discussed with Dr. Karleen Hampshire regarding his care plan. Together with the NP/PA, we formulated the assessment and plan of care which reflects our mutual decision.  I have made any additions or clarifications directly to the above note and agree with the findings and plan as currently documented.   74 yo M with hx of DM, HTN, HLD, smoker admitted for punctate left MCA territory stroke. Stroke work up showed left ICA stenosis with significant plaque and possible embolic source. A1C pending and LDL 126. Put on lipitor and dural antiplatelet. Recommend vascular surgery consult. Continue dural anti-platelet. LE DVT pending.   Rosalin Hawking, MD PhD Stroke Neurology 06/02/2014 11:52 PM       To contact Stroke Continuity provider,  please refer to [email or web address] After hours, contact General Neurology

## 2014-06-02 NOTE — Progress Notes (Signed)
  Echocardiogram 2D Echocardiogram has been performed.  Darlina Sicilian M 06/02/2014, 2:21 PM

## 2014-06-02 NOTE — Progress Notes (Signed)
[facility] PROGRESS NOTE  Marcus Gelpi Sr. 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 Further stroke work up in progress.  CTA neck shows severe atherosclerosis of the left ICA bulb with irregularity, might be source of emboli.  Right ICA no sig stenosis. LDL is 126. hgba1c is pending.  Echo does not show any thrombus.  Neurology consulted and recommendations given.  On 325 mg of aspirin an dlipitor.   Permissive hypertension:stable.   Therapy evals pending.   Code Status: full code.  Family Communication: none at bedside Disposition Plan:pending.    Consultants:  Neurology.   Procedures:  Echo  MRI brain  CT head  Carotid duplex.   Antibiotics:  none  HPI/Subjective: No new complaints.   Objective: Filed Vitals:   06/02/14 1426  BP: 131/70  Pulse: 68  Temp: 97.5 F (36.4 C)  Resp: 18   No intake or output data in the 24 hours ending 06/02/14 2043 Filed Weights   06/01/14 1059 06/01/14 1528  Weight: 91.286 kg (201 lb 4 oz) 91.286 kg (201 lb 4 oz)    Exam:   General:  Alert and comfortable  Cardiovasculars1s2  Respiratory: ctab  Abdomen:soft non tender non distended bowel sounds heard  Musculoskeletal: no pedal edema.   Data Reviewed: Basic Metabolic Panel:  Recent Labs Lab 06/01/14 1155  NA 139  K 4.2  CL 104  CO2 27  GLUCOSE 92  BUN 15  CREATININE 1.14  CALCIUM 8.9   Liver Function Tests:  Recent Labs Lab 06/01/14 1155  AST 24  ALT 19  ALKPHOS 121*  BILITOT 0.5  PROT 7.0  ALBUMIN 3.5   No results for input(s): LIPASE, AMYLASE in the last 168 hours. No results for input(s): AMMONIA in the last 168 hours. CBC:  Recent Labs Lab 06/01/14 1155  WBC 5.0  HGB 14.3  HCT 43.9  MCV 96.3  PLT 177   Cardiac Enzymes: No results for input(s): CKTOTAL, CKMB, CKMBINDEX,  TROPONINI in the last 168 hours. BNP (last 3 results) No results for input(s): BNP in the last 8760 hours.  ProBNP (last 3 results)  Recent Labs  07/07/13 1038  PROBNP 442.6*    CBG:  Recent Labs Lab 06/01/14 1742 06/01/14 2204  GLUCAP 78 95    No results found for this or any previous visit (from the past 240 hour(s)).   Studies: Ct Head Wo Contrast  06/01/2014   CLINICAL DATA:  Transient episode of dysarthria earlier today. Frontal headache  EXAM: CT HEAD WITHOUT CONTRAST  TECHNIQUE: Contiguous axial images were obtained from the base of the skull through the vertex without intravenous contrast.  COMPARISON:  September 27, 2013  FINDINGS: Mild diffuse atrophy is stable. There is no intracranial mass, hemorrhage, extra-axial fluid collection or midline shift. There is minimal small vessel disease in the centra semiovale bilaterally. Gray-white compartments are otherwise normal. No acute infarct apparent. The bony calvarium appears intact. The mastoid air cells are clear. There is slight mucosal thickening in the right maxillary antrum.  IMPRESSION: All diffuse atrophy with rather minimal periventricular small vessel disease. No intracranial mass, hemorrhage, or acute appearing infarct. Minimal right maxillary paranasal sinus disease.  Electronically Signed   By: Lowella Grip III M.D.   On: 06/01/2014 11:37   Ct Angio Neck W/cm &/or Wo/cm  06/02/2014   CLINICAL DATA:  Two infarctions in the left MCA territory. Speech disturbance. Headache.  EXAM: CT ANGIOGRAPHY NECK  TECHNIQUE: Multidetector CT imaging of the neck was performed using the standard protocol during bolus administration of intravenous contrast. Multiplanar CT image reconstructions and MIPs were obtained to evaluate the vascular anatomy. Carotid stenosis measurements (when applicable) are obtained utilizing NASCET criteria, using the distal internal carotid diameter as the denominator.  CONTRAST:  36mL OMNIPAQUE IOHEXOL 350  MG/ML SOLN  COMPARISON:  MRI 06/01/2014  FINDINGS: Aortic arch: Atherosclerosis without evidence of dissection or focal aneurysm. Branching pattern of brachiocephalic vessels from the arch is normal without origin stenosis.  Right carotid system: Motion degradation in the lower neck. Allowing for that, common carotid artery is widely patent to the bifurcation. Motion degradation at the bifurcation. Atherosclerotic calcification affecting the internal carotid artery bulb region. Minimal diameter estimated at 5 mm. Therefore, no stenosis.  Left carotid system: Motion artifact as described above. The common carotid artery is probably widely patent to the bifurcation. There is severe atherosclerotic disease at the carotid bifurcation. The study suffers from motion degradation, but I think we can say with fairly eye confidence that there is pronounced narrowing and irregularity throughout the ICA bulb region that could serve as a source of emboli. Minimal diameter is on the order of 2.5 mm, consistent with 50% stenosis.  Vertebral arteries:There is atherosclerotic disease at both vertebral artery origins without flow limiting stenosis. Beyond the origins, the vessels are widely patent through the cervical region to the foramen magnum.  Skeleton: Ordinary cervical spondylosis and facet degeneration.  Other neck: No mass or lymphadenopathy.  Lung apices are clear.  IMPRESSION: Motion degraded exam. 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 Numerous serial stenoses, most on the order of 50%. Vessel regains a normal appearance distal to the bulb.  Mild atherosclerotic disease at the right carotid bifurcation without stenosis.   Electronically Signed   By: Nelson Chimes M.D.   On: 06/02/2014 16:42   Mr Jodene Nam Head Wo Contrast  06/01/2014   CLINICAL DATA:  Episode of expressive aphasia with frontal headache.  EXAM: MRI HEAD WITHOUT  CONTRAST  MRA HEAD WITHOUT CONTRAST  TECHNIQUE: Multiplanar, multiecho pulse sequences of the brain and surrounding structures were obtained without intravenous contrast. Angiographic images of the head were obtained using MRA technique without contrast.  COMPARISON:  Head CT same day  FINDINGS: MRI HEAD FINDINGS  Diffusion imaging shows two approximately 1 cm regions of acute infarction in the left hemisphere, 1 at the posterior parietal cortical and subcortical brain and the other at the junction of the parietal and occipital lobe consistent with embolic infarctions. No large confluent infarction.  The brainstem and cerebellum are unremarkable. The cerebral hemispheres otherwise show a few old small vessel insults within the white matter. No mass lesion, hemorrhage, hydrocephalus or extra-axial collection. No pituitary mass. No inflammatory sinus disease. No skull or skullbase lesion.  MRA HEAD FINDINGS  Both internal carotid arteries are widely patent into the brain. The anterior and middle cerebral vessels are patent without proximal stenosis, aneurysm or vascular malformation.  Both vertebral arteries are widely patent to the basilar. No basilar stenosis. Posterior circulation branch vessels are normal.  IMPRESSION: Two 1 cm areas of acute  infarction in the left hemisphere consistent with embolic disease in the left middle cerebral artery territory, located at the posterior parietal region and parietal occipital junction region. No hemorrhage or mass effect.  Normal MR angiography of the large and medium size vessels.   Electronically Signed   By: Nelson Chimes M.D.   On: 06/01/2014 21:09   Mr Brain Ltd W/o Cm  06/01/2014   CLINICAL DATA:  Episode of expressive aphasia with frontal headache.  EXAM: MRI HEAD WITHOUT CONTRAST  MRA HEAD WITHOUT CONTRAST  TECHNIQUE: Multiplanar, multiecho pulse sequences of the brain and surrounding structures were obtained without intravenous contrast. Angiographic images of the  head were obtained using MRA technique without contrast.  COMPARISON:  Head CT same day  FINDINGS: MRI HEAD FINDINGS  Diffusion imaging shows two approximately 1 cm regions of acute infarction in the left hemisphere, 1 at the posterior parietal cortical and subcortical brain and the other at the junction of the parietal and occipital lobe consistent with embolic infarctions. No large confluent infarction.  The brainstem and cerebellum are unremarkable. The cerebral hemispheres otherwise show a few old small vessel insults within the white matter. No mass lesion, hemorrhage, hydrocephalus or extra-axial collection. No pituitary mass. No inflammatory sinus disease. No skull or skullbase lesion.  MRA HEAD FINDINGS  Both internal carotid arteries are widely patent into the brain. The anterior and middle cerebral vessels are patent without proximal stenosis, aneurysm or vascular malformation.  Both vertebral arteries are widely patent to the basilar. No basilar stenosis. Posterior circulation branch vessels are normal.  IMPRESSION: Two 1 cm areas of acute infarction in the left hemisphere consistent with embolic disease in the left middle cerebral artery territory, located at the posterior parietal region and parietal occipital junction region. No hemorrhage or mass effect.  Normal MR angiography of the large and medium size vessels.   Electronically Signed   By: Nelson Chimes M.D.   On: 06/01/2014 21:09    Scheduled Meds: . aspirin  325 mg Oral Daily  . atorvastatin  40 mg Oral q1800  . enoxaparin (LOVENOX) injection  40 mg Subcutaneous Q24H  . LORazepam  0.5 mg Oral BID  . timolol  1 drop Both Eyes BID   Continuous Infusions:   Active Problems:   Hyperlipidemia   Essential and other specified forms of tremor   Vitamin D deficiency   Elevated alkaline phosphatase level   TIA (transient ischemic attack)   CKD (chronic kidney disease), stage II    Time spent: 35 min    [facility] Pager [phone] If 7PM-7AM, please contact night-coverage at [email or web address], password [facility] 06/02/2014, 8:43 PM  LOS: 0 days

## 2014-06-03 ENCOUNTER — Other Ambulatory Visit: Payer: Self-pay | Admitting: Neurology

## 2014-06-03 DIAGNOSIS — I63132 Cerebral infarction due to embolism of left carotid artery: Secondary | ICD-10-CM

## 2014-06-03 DIAGNOSIS — M7989 Other specified soft tissue disorders: Secondary | ICD-10-CM

## 2014-06-03 DIAGNOSIS — I6522 Occlusion and stenosis of left carotid artery: Secondary | ICD-10-CM

## 2014-06-03 DIAGNOSIS — I639 Cerebral infarction, unspecified: Secondary | ICD-10-CM

## 2014-06-03 DIAGNOSIS — M25473 Effusion, unspecified ankle: Secondary | ICD-10-CM

## 2014-06-03 MED ORDER — ATORVASTATIN CALCIUM 40 MG PO TABS: 40.0000 mg | ORAL_TABLET | Freq: Every day | ORAL | Status: DC

## 2014-06-03 MED ORDER — ASPIRIN 81 MG PO TABS: 81.0000 mg | ORAL_TABLET | Freq: Every day | ORAL | Status: AC | PRN

## 2014-06-03 MED ORDER — CLOPIDOGREL BISULFATE 75 MG PO TABS: 75.0000 mg | ORAL_TABLET | Freq: Every day | ORAL | Status: DC

## 2014-06-03 NOTE — Discharge Summary (Signed)
Physician Discharge Summary  Marcus Kobus Sr. QJJ:[id number] DOB: 02/25/1941 DOA: 06/01/2014  PCP: Estill Dooms, MD  Admit date: 06/01/2014 Discharge date: 06/03/2014  Time spent: 30 minutes  Recommendations for Outpatient Follow-up:  1. Follow up with PCP in one week and check your hgba1c levels.  2. Follow up with Dr Donnetta Hutching as recommended 3. Follow up with Dr Erlinda Hong neurology in 2 months as recommended.   Discharge Diagnoses:  Active Problems:   Hyperlipidemia   Essential and other specified forms of tremor   Vitamin D deficiency   Elevated alkaline phosphatase level   TIA (transient ischemic attack)   CKD (chronic kidney disease), stage II   Essential hypertension   Carotid stenosis   Discharge Condition: improved  Diet recommendation: low sodium diet  Filed Weights   06/01/14 1059 06/01/14 1528  Weight: 91.286 kg (201 lb 4 oz) 91.286 kg (201 lb 4 oz)    History of present illness:  74 year old male patient relatively healthy with past medical history of chronically elevated alk phosphatase, vitamin D deficiency, dyslipidemia and anxiety disorder, presented with aphasia. He was found to have an left sided CVA in the area of the MCA.   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 Further stroke work up compelted.  CTA neck shows severe atherosclerosis of the left ICA bulb with irregularity, might be source of emboli. vascular surgery consulted and recommendations given.  Right ICA no sig stenosis. LDL is 126. hgba1c is pending.  Echo does not show any thrombus.  Neurology consulted and recommendations given.   he was on aspirin 81 mg daily , was changed to aspirin 325 mg daily and plavix. and lipitor. Recommend aspirin and plavix for 3 months and then plavix alone.     Permissive hypertension:stable.    Procedures:  MRI BRAIN  CTA HEAD AND NECK  Consultations: NEUROLOGY Discharge Exam: Filed Vitals:   06/03/14 1345  BP: 125/99   Pulse: 65  Temp: 98 F (36.7 C)  Resp: 16    General: alert afebrile comfortable Cardiovascular: s1s2 Respiratory: CTAB  Discharge Instructions   Discharge Instructions    Diet - low sodium heart healthy    Complete by:  As directed      Discharge instructions    Complete by:  As directed   Follow up with neurology and vascular surgery as recommended.  Please take aspirin and plavix for 3 months after that continue with plavix alone.          Current Discharge Medication List    START taking these medications   Details  atorvastatin (LIPITOR) 40 MG tablet Take 1 tablet (40 mg total) by mouth daily at 6 PM. Qty: 30 tablet, Refills: 1    clopidogrel (PLAVIX) 75 MG tablet Take 1 tablet (75 mg total) by mouth daily. Qty: 30 tablet, Refills: 1      CONTINUE these medications which have CHANGED   Details  ASPIRIN 325 MG DAILY        CONTINUE these medications which have NOT CHANGED   Details  acetaminophen (TYLENOL) 500 MG tablet Take 1,000 mg by mouth every 6 (six) hours as needed for mild pain or moderate pain.    Cholecalciferol (VITAMIN D PO) Take 1 tablet by mouth daily at 12 noon.    LORazepam (ATIVAN) 0.5 MG tablet Take one tablet by mouth every eight hours as needed for anxiety Qty: 90 tablet, Refills: 1    nicotine polacrilex (COMMIT)  2 MG lozenge Take 2 mg by mouth 3 (three) times daily.     timolol (TIMOPTIC) 0.25 % ophthalmic solution Place 1 drop into both eyes 2 (two) times daily.       Allergies  Allergen Reactions  . Codeine Other (See Comments)    constipation  . Demerol [Meperidine] Other (See Comments)    Drops blood pressure    Follow-up Information    Follow up with GREEN, [name], MD. Schedule an appointment as soon as possible for a visit in 1 week.   Specialty:  Internal Medicine   Contact information:   [place] [address] [phone]       Follow up with Xu,Jindong, MD. Schedule an appointment as soon as  possible for a visit in 2 months.   Specialty:  Neurology   Contact information:   [address] [place] [address] [phone]       Follow up with EARLY, TODD, MD.   Specialty:  Vascular Surgery   Why:  as recommended.    Contact information:   [address] [place] [address] [phone]        The results of significant diagnostics from this hospitalization (including imaging, microbiology, ancillary and laboratory) are listed below for reference.    Significant Diagnostic Studies: Ct Head Wo Contrast  06/01/2014   CLINICAL DATA:  Transient episode of dysarthria earlier today. Frontal headache  EXAM: CT HEAD WITHOUT CONTRAST  TECHNIQUE: Contiguous axial images were obtained from the base of the skull through the vertex without intravenous contrast.  COMPARISON:  September 27, 2013  FINDINGS: Mild diffuse atrophy is stable. There is no intracranial mass, hemorrhage, extra-axial fluid collection or midline shift. There is minimal small vessel disease in the centra semiovale bilaterally. Gray-white compartments are otherwise normal. No acute infarct apparent. The bony calvarium appears intact. The mastoid air cells are clear. There is slight mucosal thickening in the right maxillary antrum.  IMPRESSION: All diffuse atrophy with rather minimal periventricular small vessel disease. No intracranial mass, hemorrhage, or acute appearing infarct. Minimal right maxillary paranasal sinus disease.   Electronically Signed   By: Lowella Grip III M.D.   On: 06/01/2014 11:37   Ct Angio Neck W/cm &/or Wo/cm  06/02/2014   CLINICAL DATA:  Two infarctions in the left MCA territory. Speech disturbance. Headache.  EXAM: CT ANGIOGRAPHY NECK  TECHNIQUE: Multidetector CT imaging of the neck was performed using the standard protocol during bolus administration of intravenous contrast. Multiplanar CT image reconstructions and MIPs were obtained to evaluate the vascular anatomy. Carotid  stenosis measurements (when applicable) are obtained utilizing NASCET criteria, using the distal internal carotid diameter as the denominator.  CONTRAST:  35m OMNIPAQUE IOHEXOL 350 MG/ML SOLN  COMPARISON:  MRI 06/01/2014  FINDINGS: Aortic arch: Atherosclerosis without evidence of dissection or focal aneurysm. Branching pattern of brachiocephalic vessels from the arch is normal without origin stenosis.  Right carotid system: Motion degradation in the lower neck. Allowing for that, common carotid artery is widely patent to the bifurcation. Motion degradation at the bifurcation. Atherosclerotic calcification affecting the internal carotid artery bulb region. Minimal diameter estimated at 5 mm. Therefore, no stenosis.  Left carotid system: Motion artifact as described above. The common carotid artery is probably widely patent to the bifurcation. There is severe atherosclerotic disease at the carotid bifurcation. The study suffers from motion degradation, but I think we can say with fairly eye confidence that there is pronounced narrowing and irregularity  throughout the ICA bulb region that could serve as a source of emboli. Minimal diameter is on the order of 2.5 mm, consistent with 50% stenosis.  Vertebral arteries:There is atherosclerotic disease at both vertebral artery origins without flow limiting stenosis. Beyond the origins, the vessels are widely patent through the cervical region to the foramen magnum.  Skeleton: Ordinary cervical spondylosis and facet degeneration.  Other neck: No mass or lymphadenopathy.  Lung apices are clear.  IMPRESSION: Motion degraded exam. 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 Numerous serial stenoses, most on the order of 50%. Vessel regains a normal appearance distal to the bulb.  Mild atherosclerotic disease at the right carotid bifurcation without stenosis.   Electronically Signed   By:  Nelson Chimes M.D.   On: 06/02/2014 16:42   Mr Jodene Nam Head Wo Contrast  06/01/2014   CLINICAL DATA:  Episode of expressive aphasia with frontal headache.  EXAM: MRI HEAD WITHOUT CONTRAST  MRA HEAD WITHOUT CONTRAST  TECHNIQUE: Multiplanar, multiecho pulse sequences of the brain and surrounding structures were obtained without intravenous contrast. Angiographic images of the head were obtained using MRA technique without contrast.  COMPARISON:  Head CT same day  FINDINGS: MRI HEAD FINDINGS  Diffusion imaging shows two approximately 1 cm regions of acute infarction in the left hemisphere, 1 at the posterior parietal cortical and subcortical brain and the other at the junction of the parietal and occipital lobe consistent with embolic infarctions. No large confluent infarction.  The brainstem and cerebellum are unremarkable. The cerebral hemispheres otherwise show a few old small vessel insults within the white matter. No mass lesion, hemorrhage, hydrocephalus or extra-axial collection. No pituitary mass. No inflammatory sinus disease. No skull or skullbase lesion.  MRA HEAD FINDINGS  Both internal carotid arteries are widely patent into the brain. The anterior and middle cerebral vessels are patent without proximal stenosis, aneurysm or vascular malformation.  Both vertebral arteries are widely patent to the basilar. No basilar stenosis. Posterior circulation branch vessels are normal.  IMPRESSION: Two 1 cm areas of acute infarction in the left hemisphere consistent with embolic disease in the left middle cerebral artery territory, located at the posterior parietal region and parietal occipital junction region. No hemorrhage or mass effect.  Normal MR angiography of the large and medium size vessels.   Electronically Signed   By: Nelson Chimes M.D.   On: 06/01/2014 21:09   Mr Brain Ltd W/o Cm  06/01/2014   CLINICAL DATA:  Episode of expressive aphasia with frontal headache.  EXAM: MRI HEAD WITHOUT CONTRAST  MRA HEAD  WITHOUT CONTRAST  TECHNIQUE: Multiplanar, multiecho pulse sequences of the brain and surrounding structures were obtained without intravenous contrast. Angiographic images of the head were obtained using MRA technique without contrast.  COMPARISON:  Head CT same day  FINDINGS: MRI HEAD FINDINGS  Diffusion imaging shows two approximately 1 cm regions of acute infarction in the left hemisphere, 1 at the posterior parietal cortical and subcortical brain and the other at the junction of the parietal and occipital lobe consistent with embolic infarctions. No large confluent infarction.  The brainstem and cerebellum are unremarkable. The cerebral hemispheres otherwise show a few old small vessel insults within the white matter. No mass lesion, hemorrhage, hydrocephalus or extra-axial collection. No pituitary mass. No inflammatory sinus disease. No skull or skullbase lesion.  MRA HEAD FINDINGS  Both internal carotid arteries are widely patent into the  brain. The anterior and middle cerebral vessels are patent without proximal stenosis, aneurysm or vascular malformation.  Both vertebral arteries are widely patent to the basilar. No basilar stenosis. Posterior circulation branch vessels are normal.  IMPRESSION: Two 1 cm areas of acute infarction in the left hemisphere consistent with embolic disease in the left middle cerebral artery territory, located at the posterior parietal region and parietal occipital junction region. No hemorrhage or mass effect.  Normal MR angiography of the large and medium size vessels.   Electronically Signed   By: Nelson Chimes M.D.   On: 06/01/2014 21:09    Microbiology: No results found for this or any previous visit (from the past 240 hour(s)).   Labs: Basic Metabolic Panel:  Recent Labs Lab 06/01/14 1155  NA 139  K 4.2  CL 104  CO2 27  GLUCOSE 92  BUN 15  CREATININE 1.14  CALCIUM 8.9   Liver Function Tests:  Recent Labs Lab 06/01/14 1155  AST 24  ALT 19  ALKPHOS  121*  BILITOT 0.5  PROT 7.0  ALBUMIN 3.5   No results for input(s): LIPASE, AMYLASE in the last 168 hours. No results for input(s): AMMONIA in the last 168 hours. CBC:  Recent Labs Lab 06/01/14 1155  WBC 5.0  HGB 14.3  HCT 43.9  MCV 96.3  PLT 177   Cardiac Enzymes: No results for input(s): CKTOTAL, CKMB, CKMBINDEX, TROPONINI in the last 168 hours. BNP: BNP (last 3 results) No results for input(s): BNP in the last 8760 hours.  ProBNP (last 3 results)  Recent Labs  07/07/13 1038  PROBNP 442.6*    CBG:  Recent Labs Lab 06/01/14 1742 06/01/14 2204  GLUCAP 78 95       Signed:  Kendyn Zaman  [facility] 06/03/2014, 2:40 PM

## 2014-06-03 NOTE — Progress Notes (Signed)
STROKE TEAM PROGRESS NOTE   HISTORY Marcus Blubaugh Sr. is a 74 y.o. male who states he is very active. 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 After a while it slowly resolved. There was no arm or face involvement. He felt fine later that day and went to bed. HE awoke at 0600 hours. At [facility] he tried to talk to his wife and could not get his words out. This lasted for 20-30 seconds and resolved. He went to work and later was called by his wife who told him to go to the hospital. He has had no further symptoms. He denies any previous symptoms such as this and denies any one sided numbness, tingling, weakness. He is not on antiplatelet therapy at home.  Date last known well: Date: 05/31/2014 Time last known well: Time: 06:45 tPA Given: No: symptoms resolved  Modified Rankin: Rankin Score=0  SUBJECTIVE (INTERVAL HISTORY) The patient's wife was at the bedside. The patient is without complaints. Dr Erlinda Hong reviewed the images with the patient's wife. Questions were answered. The patient is without complaints at this time.   OBJECTIVE Temp:  [97.6 F (36.4 C)-98 F (36.7 C)] 98 F (36.7 C) (04/03 1345) Pulse Rate:  [53-65] 65 (04/03 1345) Cardiac Rhythm:  [-] Normal sinus rhythm;Sinus bradycardia (04/03 0800) Resp:  [16-18] 16 (04/03 1345) BP: (109-165)/(52-99) 125/99 mmHg (04/03 1345) SpO2:  [96 %-100 %] 99 % (04/03 1345)   Recent Labs Lab 06/01/14 1742 06/01/14 2204  GLUCAP 78 95    Recent Labs Lab 06/01/14 1155  NA 139  K 4.2  CL 104  CO2 27  GLUCOSE 92  BUN 15  CREATININE 1.14  CALCIUM 8.9    Recent Labs Lab 06/01/14 1155  AST 24  ALT 19  ALKPHOS 121*  BILITOT 0.5  PROT 7.0  ALBUMIN 3.5    Recent Labs Lab 06/01/14 1155  WBC 5.0  HGB 14.3  HCT 43.9  MCV 96.3  PLT 177   No results for input(s): CKTOTAL, CKMB, CKMBINDEX, TROPONINI in the last 168 hours.  Recent Labs   06/01/14 1155  LABPROT 15.0  INR 1.17   No results for input(s): COLORURINE, LABSPEC, PHURINE, GLUCOSEU, HGBUR, BILIRUBINUR, KETONESUR, PROTEINUR, UROBILINOGEN, NITRITE, LEUKOCYTESUR in the last 72 hours.  Invalid input(s): APPERANCEUR     Component Value Date/Time   CHOL 167 06/02/2014 0544   CHOL 173 01/15/2014 0848   TRIG 91 06/02/2014 0544   HDL 23* 06/02/2014 0544   HDL 30* 01/15/2014 0848   CHOLHDL 7.3 06/02/2014 0544   CHOLHDL 5.8* 01/15/2014 0848   VLDL 18 06/02/2014 0544   LDLCALC 126* 06/02/2014 0544   LDLCALC 127* 01/15/2014 0848   Lab Results  Component Value Date   HGBA1C 5.6 01/15/2014      Component Value Date/Time   LABOPIA NONE DETECTED 06/02/2014 0544   COCAINSCRNUR NONE DETECTED 06/02/2014 0544   LABBENZ NONE DETECTED 06/02/2014 0544   AMPHETMU NONE DETECTED 06/02/2014 0544   THCU NONE DETECTED 06/02/2014 0544   LABBARB NONE DETECTED 06/02/2014 0544    No results for input(s): ETH in the last 168 hours.  I have personally reviewed the radiological images below and agree with the radiology interpretations.  Ct Head Wo Contrast 06/01/2014    All diffuse atrophy with rather minimal periventricular small vessel disease. No intracranial mass, hemorrhage, or acute appearing infarct. Minimal right maxillary paranasal sinus disease.  Mri and Mra Head Wo Contrast 06/01/2014    Two 1 cm areas of acute infarction in the left hemisphere consistent with embolic disease in the left middle cerebral artery territory, located at the posterior parietal region and parietal occipital junction region.  No hemorrhage or mass effect.   Normal MR angiography of the large and medium size vessels.     2D echo - - Procedure narrative: Transthoracic echocardiography. Image quality was poor. The study was technically difficult, as a result of poor acoustic windows. - Left ventricle: The cavity size was normal. There was mild concentric hypertrophy. Systolic function  was normal. The estimated ejection fraction was in the range of 60% to 65%. Images were inadequate for LV wall motion assessment. Doppler parameters are consistent with abnormal left ventricular relaxation (grade 1 diastolic dysfunction). - Mitral valve: Mildly calcified annulus. Normal thickness leaflets . - Left atrium: The atrium was mildly dilated. - Right atrium: The atrium was mildly dilated.  CUS - 1-39% right ICA stenosis. 40-59% left ICA stenosis, lowest end of scale. Vertebral artery flow is antegrade.  LE venous doppler - Preliminary report: There is no DVT or SVT noted in the bilateral lower extremities.   CTA neck - Motion degraded exam. 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 Numerous serial stenoses, most on the order of 50%. Vessel regains a normal appearance distal to the bulb. Mild atherosclerotic disease at the right carotid bifurcation without stenosis.  PHYSICAL EXAM  Temp:  [97.6 F (36.4 C)-98 F (36.7 C)] 98 F (36.7 C) (04/03 1345) Pulse Rate:  [53-65] 65 (04/03 1345) Resp:  [16-18] 16 (04/03 1345) BP: (109-165)/(52-99) 125/99 mmHg (04/03 1345) SpO2:  [96 %-100 %] 99 % (04/03 1345)  General - Well nourished, well developed, in no apparent distress.  Ophthalmologic - Sharp disc margins OU.  Cardiovascular - Regular rate and rhythm with no murmur.  Mental Status -  Level of arousal and orientation to time, place, and person were intact. Language including expression, naming, repetition, comprehension was assessed and found intact. Fund of Knowledge was assessed and was intact.  Cranial Nerves II - XII - II - Visual field intact OU. III, IV, VI - Extraocular movements intact. V - Facial sensation intact bilaterally. VII - Facial movement intact bilaterally. VIII - Hearing & vestibular intact bilaterally. X - Palate elevates symmetrically. XI  - Chin turning & shoulder shrug intact bilaterally. XII - Tongue protrusion intact.  Motor Strength - The patient's strength was normal in all extremities and pronator drift was absent.  Bulk was normal and fasciculations were absent.   Motor Tone - Muscle tone was assessed at the neck and appendages and was normal.  Reflexes - The patient's reflexes were 1+ in all extremities and he had no pathological reflexes.  Sensory - Light touch, temperature/pinprick were assessed and were symmetrical.    Coordination - The patient had normal movements in the hands and feet with no ataxia or dysmetria.  Tremor was absent.  Gait and Station - The patient's transfers, posture, gait, station, and turns were observed as normal.  ASSESSMENT/PLAN Mr. Marcus Zumbro Sr. is a 74 y.o. male with history of  diabetes mellitus, hyperlipidemia, and hypertension presenting with transient aphasia and left hand numbness. He did not receive IV t-PA due to resolution of deficits.   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  Resultant  resolution of deficits  MRI  as above  MRA  Normal  CTA of Neck - left ICA significant plaque formation and stenosis  Carotid Doppler - 40-59% left ICA stenosis.  Lower extremity Dopplers - negative for DVT.   2D Echo - EF 60-65%. No cardiac source of emboli identified.  LDL 126, not at goal  HgbA1c pending  Lovenox for VTE prophylaxis Diet regular Room service appropriate?: Yes; Fluid consistency:: Thin Diet - low sodium heart healthy  aspirin 81 mg orally every day prior to admission, now on aspirin 325 mg orally every day. Recommend dural antiplatelet for 3 months and then plavix alone.  Patient counseled to be compliant with his antithrombotic medications  Ongoing aggressive stroke risk factor management  Therapy recommendations: No follow-up therapies recommended.  Disposition:  Pending  Carotid  stenosis  CUS showed left ICA 40-59% stenosis  CTA neck showed significant left ICA plaque formation and stenosis.  Dr Early consulted for vascular surgery - left carotid endarterectomy scheduled for 06/08/2014.  Recommend ASA and plavix dual antiplatelet for 3 months and then plavix alone.  Hypertension  Home meds: No antihypertensive medications prior to admission  Blood pressure low at times. Permissive hypertension (OK if <220/120) for 24-48 hours post stroke and then gradually normalized within 5-7 days.  Hyperlipidemia  Home meds: No lipid lowering medications prior to admission  LDL 126, goal < 70  Add Lipitor 40 mg daily  Continue statin at discharge  Diabetes  HgbA1c pending, goal < 7.0  Controlled  Tobacco abuse  Current smoker  Smoking cessation counseling provided  Pt is willing to quit  Other Stroke Risk Factors  Advanced age  Obesity, Body mass index is 31.51 kg/(m^2).   Family hx stroke (father)  Hospital day # [facility] Pager [phone] 06/03/2014, 3:36 PM  I, the attending vascular neurologist, have personally obtained a history, examined the patient, evaluated laboratory data, individually viewed imaging studies and agree with radiology interpretations. I also obtained additional history from pt's wife via the phone. I also discussed with Dr. Karleen Hampshire regarding his care plan. Together with the NP/PA, we formulated the assessment and plan of care which reflects our mutual decision.  I have made any additions or clarifications directly to the above note and agree with the findings and plan as currently documented.   74 yo M with hx of DM, HTN, HLD, smoker admitted for punctate left MCA territory stroke. Stroke work up showed left ICA stenosis with significant plaque and possible embolic source. A1C pending and LDL 126. Put on lipitor and dural antiplatelet. Vascular surgery consulted and will do CEA on  06/08/14. Continue dural anti-platelet. Risk factor modification.   Rosalin Hawking, MD PhD Stroke Neurology 06/03/2014 11:08 PM    To contact Stroke Continuity provider, please refer to [email or web address] After hours, contact General Neurology

## 2014-06-03 NOTE — Progress Notes (Signed)
OT Cancellation Note  Patient Details Name: Marcus Dilks Sr. MRN: [id number] DOB: 1940-08-27   Cancelled Treatment:    Reason Eval/Treat Not Completed: OT screened, no needs identified, will sign off. Pt evaluated pt and pt at an independent level, no OT needs were identified by PT, we will sign off.  Almon Register [phone] 06/03/2014, 3:28 PM

## 2014-06-03 NOTE — Progress Notes (Signed)
VASCULAR LAB PRELIMINARY  PRELIMINARY  PRELIMINARY  PRELIMINARY  Carotid Dopplers completed.    Preliminary report:  There is no DVT or SVT noted in the bilateral lower extremities.   Teran Daughenbaugh, RVT 06/03/2014, 11:38 AM

## 2014-06-03 NOTE — Consult Note (Signed)
Patient name: Marcus Grell Sr. MRN: [id number] DOB: 1940/05/27 Sex: male   Referred by: Karleen Hampshire  Reason for referral:  Chief Complaint  Patient presents with  . Transient Ischemic Attack    HISTORY OF PRESENT ILLNESS: The patient is an active healthy 74 year old gentleman who on 06/01/2014 had an episode of right second and third finger numbness and tingling and expressive aphasia. He was admitted for stroke workup. He reports he has returned to his complete normal baseline with no deficits and no speech disturbances. He denies any prior history of similar events. He has had no episodes of amaurosis or prior aphasia or weakness. Has no prior cardiac history or other peripheral vascular occlusive disease history. He does not smoke cigarettes. He is quite active and works 50 hours per week.  Past Medical History  Diagnosis Date  . Unspecified vitamin D deficiency   . Urethral stricture unspecified   . Hypertrophy of prostate with urinary obstruction and other lower urinary tract symptoms (LUTS)   . Type II or unspecified type diabetes mellitus without mention of complication, not stated as uncontrolled   . Anxiety state, unspecified   . 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 Urinary frequency   . Insomnia, unspecified   . Unspecified glaucoma   . 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 Calculus of kidney   . Other and unspecified hyperlipidemia   . Reflux esophagitis   . Lumbago   . Unspecified essential hypertension   . Herpes zoster without mention of complication   . Glaucoma     Past Surgical History  Procedure Laterality Date  . Spinal fusion  12/91,11/91    obtained bone from left lower leg  . Eye surgery  2014    cataract  . Eye surgery  2013    Retina  . Cardiac catheterization  1986    normal, Dr Lia Foyer  . Stress thallium  1994     normal;  . Stretching bladder  07/2007    Dr Risa Grill    History   Social History  . Marital Status: Married    Spouse Name: N/A  . Number of Children: N/A  . Years of Education: N/A   Occupational History  . Not on file.   Social History Main Topics  . Smoking status: Former Research scientist (life sciences)  . Smokeless tobacco: Not on file  . Alcohol Use: No  . Drug Use: No  . Sexual Activity: Yes    Birth Control/ Protection: None   Other Topics Concern  . Not on file   Social History Narrative    Family History  Problem Relation Age of Onset  . Diabetes Mother   . Cancer Mother   . Hypertension Father   . Stroke Father   . Alcohol abuse Brother     Allergies as of 06/01/2014 - Review Complete 06/01/2014  Allergen Reaction Noted  . Codeine Other (See Comments) 09/27/2013  . Demerol [meperidine] Other (See Comments) 07/19/2012    No current facility-administered medications on file prior to encounter.   Current Outpatient Prescriptions on File Prior to Encounter  Medication Sig Dispense Refill  . LORazepam (ATIVAN) 0.5 MG tablet Take one tablet by mouth every eight hours as needed for anxiety 90 tablet 1  . nicotine polacrilex (COMMIT) 2 MG lozenge Take 2 mg by mouth 3 (three) times daily.     . timolol (TIMOPTIC) 0.25 % ophthalmic solution Place 1 drop into both eyes 2 (two)  times daily.       REVIEW OF SYSTEMS:  Positives indicated with an "X"  CARDIOVASCULAR:  [ ]  chest pain   [ ]  chest pressure   [ ]  palpitations   [ ]  orthopnea   [ ]  dyspnea on exertion   [ ]  claudication   [ ]  rest pain   [ ]  DVT   [ ]  phlebitis PULMONARY:   [ ]  productive cough   [ ]  asthma   [ ]  wheezing NEUROLOGIC:   [ ]  weakness  [ ]  paresthesias  [ ]  aphasia  [ ]  amaurosis  [ ]  dizziness HEMATOLOGIC:   [ ]  bleeding problems   [ ]  clotting disorders MUSCULOSKELETAL:  [ ]  joint pain   [ ]  joint swelling GASTROINTESTINAL: [ ]   blood in stool  [ ]   hematemesis GENITOURINARY:  [ ]   dysuria  [ ]   hematuria PSYCHIATRIC:  [ ]  history of major depression INTEGUMENTARY:  [ ]  rashes  [ ]  ulcers CONSTITUTIONAL:  [ ]  fever    [ ]  chills  PHYSICAL EXAMINATION:  General: The patient is a well-nourished male, in no acute distress. Vital signs are BP 132/87 mmHg  Pulse 62  Temp(Src) 98 F (36.7 C) (Oral)  Resp 16  Ht 5\' 7"  (1.702 m)  Wt 201 lb 4 oz (91.286 kg)  BMI 31.51 kg/m2  SpO2 99% Pulmonary: There is a good air exchange  Musculoskeletal: There are no major deformities.  There is no significant extremity pain. Neurologic: No focal weakness or paresthesias are detected, Skin: There are no ulcer or rashes noted. Psychiatric: The patient has normal affect.    I reviewed his imaging studies to include MRI and CT of his neck. This did show an acute stroke with 2 separate areas of centimeter infarctions in the left hemisphere consistent with emboli to the left middle cerebral artery  CT angiogram of his neck showed severe plaque formation with at least 50% stenosis in the bifurcation in the left carotid  Impression and Plan:  Left brain stroke. Extensive workup shows no other source. Does have a very irregular 50% stenosis in the left carotid bifurcation which is the most likely cause for his stroke. I have recommended left carotid endarterectomy for reduction of stroke risk. I explained the timing of this. Explained that there is some risk of converting this to a hemorrhagic stroke with acute surgery. Also explain the significant risk of recurrent stroke with prolonged waiting. Since she's had complete resolution with no deficit. I would recommend left carotid endarterectomy on 06/08/2014. Filled the patient is satisfactory for discharge to home today. Will continue his aspirin and Plavix and will undergo surgery on 06/08/2014. Explained expected 1 hospitalization. Also explain the slight risk of stroke related to surgery. I gave him my contact information to let me know if he has any new deficits. Otherwise will be admitted for surgery later next week    Yuvraj Pfeifer [facility]: [phone]

## 2014-06-03 NOTE — Progress Notes (Signed)
Patient is being d/c home, d/c instruction given and patient verbalized understanding. Condition stable

## 2014-06-03 NOTE — Evaluation (Signed)
Physical Therapy Evaluation Patient Details Name: Marcus Erman Sr. MRN: [id number] DOB: 21-Mar-1940 Today's Date: 06/03/2014   History of Present Illness  74 yo M with hx of DM, HTN, HLD, smoker admitted for punctate left MCA territory stroke. Stroke work up showed left ICA stenosis with significant plaque and possible embolic source.  Clinical Impression  Patient demonstrates no significant deficits in strength or mobility at this time. Spoke with patient and family extensively regarding [facility] stroke education. No further acute PT needs, will sign off.    Follow Up Recommendations No PT follow up    Equipment Recommendations       Recommendations for Other Services       Precautions / Restrictions        Mobility  Bed Mobility                  Transfers Overall transfer level: Independent                  Ambulation/Gait Ambulation/Gait assistance: Independent Ambulation Distance (Feet): 210 Feet Assistive device: None Gait Pattern/deviations: WFL(Within Functional Limits)        Stairs            Wheelchair Mobility    Modified Rankin (Stroke Patients Only) Modified Rankin (Stroke Patients Only) Pre-Morbid Rankin Score: No symptoms Modified Rankin: No significant disability     Balance Overall balance assessment: No apparent balance deficits (not formally assessed)                               Standardized Balance Assessment Standardized Balance Assessment : Dynamic Gait Index   Dynamic Gait Index Level Surface: Normal Change in Gait Speed: Normal Gait with Horizontal Head Turns: Normal Gait with Vertical Head Turns: Mild Impairment Gait and Pivot Turn: Mild Impairment Step Over Obstacle: Mild Impairment       Pertinent Vitals/Pain Pain Assessment: No/denies pain    Home Living Family/patient expects to be discharged to:: Private residence Living Arrangements: Spouse/significant other Available Help at  Discharge: Family Type of Home: House Home Access: Level entry     Home Layout: One level Home Equipment: None      Prior Function Level of Independence: Independent         Comments: very active, works 50 hours a week     Hand Dominance   Dominant Hand: Right    Extremity/Trunk Assessment   Upper Extremity Assessment: Overall WFL for tasks assessed           Lower Extremity Assessment: Overall WFL for tasks assessed         Communication   Communication: No difficulties  Cognition Arousal/Alertness: Awake/alert Behavior During Therapy: WFL for tasks assessed/performed Overall Cognitive Status: Within Functional Limits for tasks assessed                      General Comments      Exercises        Assessment/Plan    PT Assessment Patent does not need any further PT services  PT Diagnosis Difficulty walking   PT Problem List    PT Treatment Interventions     PT Goals (Current goals can be found in the Care Plan section) Acute Rehab PT Goals PT Goal Formulation: All assessment and education complete, DC therapy    Frequency     Barriers to discharge        Co-evaluation  End of Session   Activity Tolerance: Patient tolerated treatment well Patient left: in chair;with family/visitor present Nurse Communication: Mobility status         Time: 1423-9532 PT Time Calculation (min) (ACUTE ONLY): 18 min   Charges:   PT Evaluation $Initial PT Evaluation Tier I: 1 Procedure     PT G CodesDuncan Dull 2014/06/17, 3:26 PM Alben Deeds, [place] DPT  [phone]

## 2014-06-04 ENCOUNTER — Telehealth: Payer: Self-pay

## 2014-06-04 ENCOUNTER — Telehealth: Payer: Self-pay | Admitting: Internal Medicine

## 2014-06-04 ENCOUNTER — Other Ambulatory Visit: Payer: Self-pay | Admitting: *Deleted

## 2014-06-04 LAB — HEMOGLOBIN A1C
HEMOGLOBIN A1C: 5.8 % — AB (ref 4.8–5.6)
Mean Plasma Glucose: 120 mg/dL

## 2014-06-04 NOTE — Telephone Encounter (Signed)
Patient went to ER 4/3//16 had 2 mini-strokes. Was told to follow-up with Dr. Nyoka Cowden this week. But Dr. Donnetta Hutching is doing an Endarterectomy 06/08/14, is there any need for Marcus [name] to come in this week? The ER increased his aspirin from 81 to 325mg  and he's on Plavix. She's a little concern that he may have bleeding problems. Told her to call Dr. Luther Parody office since he will be doing the Endarterectomy this week. Has appt with Dr. Nyoka Cowden 5/16 lab, OV 07/18/14, will route note to Dr. Nyoka Cowden, for his advise.

## 2014-06-04 NOTE — Telephone Encounter (Signed)
Called patient to schedule a hospital follow up and spoke to patients wife. She stated she called this morning and spoke with Debbie. She stated that the Patient was having surgery with Dr. Sherren Mocha Early this Friday 06/08/14 and Jackelyn Poling informed her to call back after the surgery with Dr. Donnetta Hutching to schedule a follow up appointment.

## 2014-06-04 NOTE — Telephone Encounter (Signed)
I think we should schedule an appointment to see him about one week after the scheduled surgery. He does not have to come in this week.

## 2014-06-04 NOTE — Telephone Encounter (Signed)
Dr. Nyoka Cowden wanted to see Marcus [name] after his surgery, appt made for 06/13/14 at 2:00, spoke with Marcus [name]

## 2014-06-05 ENCOUNTER — Other Ambulatory Visit: Payer: Self-pay

## 2014-06-06 ENCOUNTER — Encounter: Payer: Self-pay | Admitting: *Deleted

## 2014-06-06 ENCOUNTER — Encounter (HOSPITAL_COMMUNITY)
Admission: RE | Admit: 2014-06-06 | Discharge: 2014-06-06 | Disposition: A | Discharge status: Home / Self Care | Payer: Medicare Other | Source: Ambulatory Visit | Attending: Vascular Surgery | Admitting: Vascular Surgery

## 2014-06-06 ENCOUNTER — Other Ambulatory Visit: Payer: Self-pay | Admitting: *Deleted

## 2014-06-06 ENCOUNTER — Encounter (HOSPITAL_COMMUNITY): Payer: Self-pay

## 2014-06-06 DIAGNOSIS — N390 Urinary tract infection, site not specified: Secondary | ICD-10-CM

## 2014-06-06 HISTORY — DX: Pneumonia, unspecified organism: J18.9

## 2014-06-06 HISTORY — DX: Other seasonal allergic rhinitis: J30.2

## 2014-06-06 HISTORY — DX: Transient cerebral ischemic attack, unspecified: G45.9

## 2014-06-06 HISTORY — DX: Adverse effect of unspecified anesthetic, initial encounter: T41.45XA

## 2014-06-06 HISTORY — DX: Personal history of other diseases of the digestive system: Z87.19

## 2014-06-06 HISTORY — DX: Other complications of anesthesia, initial encounter: T88.59XA

## 2014-06-06 LAB — URINALYSIS, ROUTINE W REFLEX MICROSCOPIC
BILIRUBIN URINE: NEGATIVE
Glucose, UA: NEGATIVE mg/dL
KETONES UR: NEGATIVE mg/dL
Nitrite: POSITIVE — AB
Protein, ur: NEGATIVE mg/dL
Specific Gravity, Urine: 1.023 (ref 1.005–1.030)
UROBILINOGEN UA: 0.2 mg/dL (ref 0.0–1.0)
pH: 5.5 (ref 5.0–8.0)

## 2014-06-06 LAB — POCT I-STAT, CHEM 8
BUN: 20 mg/dL (ref 6–23)
CHLORIDE: 102 mmol/L (ref 96–112)
Calcium, Ion: 1.15 mmol/L (ref 1.13–1.30)
Creatinine, Ser: 1.2 mg/dL (ref 0.50–1.35)
GLUCOSE: 99 mg/dL (ref 70–99)
HCT: 49 % (ref 39.0–52.0)
HEMOGLOBIN: 16.7 g/dL (ref 13.0–17.0)
Potassium: 4.3 mmol/L (ref 3.5–5.1)
SODIUM: 140 mmol/L (ref 135–145)
TCO2: 24 mmol/L (ref 0–100)

## 2014-06-06 LAB — URINE MICROSCOPIC-ADD ON

## 2014-06-06 LAB — TYPE AND SCREEN
ABO/RH(D): O NEG
Antibody Screen: NEGATIVE

## 2014-06-06 LAB — SURGICAL PCR SCREEN
MRSA, PCR: NEGATIVE
Staphylococcus aureus: NEGATIVE

## 2014-06-06 LAB — ABO/RH: ABO/RH(D): O NEG

## 2014-06-06 LAB — APTT: APTT: 32 s (ref 24–37)

## 2014-06-06 MED ORDER — CIPROFLOXACIN HCL 500 MG PO TABS
500.0000 mg | ORAL_TABLET | Freq: Two times a day (BID) | ORAL | Status: DC
Start: 2014-06-06 — End: 2014-06-13

## 2014-06-06 NOTE — Progress Notes (Signed)
Pt denies SOB, chest pain, and being under the care of a cardiologist. Pt stated that he takes Aspirin at HS and was instructed by MD not to take Plavix the morning of procedure. Spoke with Arbie Cookey, RN regarding I-stat Chem 8 and she stated that the order was put in just in case anesthesia needed labs. Spoke with Ebony Hail, [place] regarding labs, okay to do I -stat chem 8.

## 2014-06-06 NOTE — Pre-Procedure Instructions (Signed)
Marcus Woessner Sr.  06/06/2014   Your procedure is scheduled on: Friday, June 08, 2014  Report to [facility] Admitting at 5:30 AM.  Call this number if you have problems the morning of surgery: [phone]   Remember:   Do not eat food or drink liquids after midnight Thursday, June 07, 2014   Take these medicines the morning of surgery with A SIP OF WATER: timolol (TIMOPTIC) eye drops,  If needed: acetaminophen (TYLENOL) 500 MG tablet for pain,  LORazepam (ATIVAN) for anxiety  Do not wear jewelry, make-up or nail polish.  Do not wear lotions, powders, or perfumes. You may not wear deodorant.  Do not shave 48 hours prior to surgery. Men may shave face and neck.  Do not bring valuables to the hospital.  [facility] is not responsible for any belongings or valuables.               Contacts, dentures or bridgework may not be worn into surgery.  Leave suitcase in the car. After surgery it may be brought to your room.  For patients admitted to the hospital, discharge time is determined by your treatment team.               Patients discharged the day of surgery will not be allowed to drive home.  Name and phone number of your driver:   Special Instructions:  Special Instructions:Special Instructions: [facility] - Preparing for Surgery  Before surgery, you can play an important role.  Because skin is not sterile, your skin needs to be as free of germs as possible.  You can reduce the number of germs on you skin by washing with CHG (chlorahexidine gluconate) soap before surgery.  CHG is an antiseptic cleaner which kills germs and bonds with the skin to continue killing germs even after washing.  Please DO NOT use if you have an allergy to CHG or antibacterial soaps.  If your skin becomes reddened/irritated stop using the CHG and inform your nurse when you arrive at Short Stay.  Do not shave (including legs and underarms) for at least 48 hours prior to the first CHG shower.  You may  shave your face.  Please follow these instructions carefully:   1.  Shower with CHG Soap the night before surgery and the morning of Surgery.  2.  If you choose to wash your hair, wash your hair first as usual with your normal shampoo.  3.  After you shampoo, rinse your hair and body thoroughly to remove the Shampoo.  4.  Use CHG as you would any other liquid soap.  You can apply chg directly  to the skin and wash gently with scrungie or a clean washcloth.  5.  Apply the CHG Soap to your body ONLY FROM THE NECK DOWN.  Do not use on open wounds or open sores.  Avoid contact with your eyes, ears, mouth and genitals (private parts).  Wash genitals (private parts) with your normal soap.  6.  Wash thoroughly, paying special attention to the area where your surgery will be performed.  7.  Thoroughly rinse your body with warm water from the neck down.  8.  DO NOT shower/wash with your normal soap after using and rinsing off the CHG Soap.  9.  Pat yourself dry with a clean towel.            10.  Wear clean pajamas.  11.  Place clean sheets on your bed the night of your first shower and do not sleep with pets.  Day of Surgery  Do not apply any lotions/deodorants the morning of surgery.  Please wear clean clothes to the hospital/surgery center.   Please read over the following fact sheets that you were given: Pain Booklet, Coughing and Deep Breathing, Blood Transfusion Information, MRSA Information and Surgical Site Infection Prevention

## 2014-06-06 NOTE — Progress Notes (Signed)
Sent Rx to [facility] - Cipro 500mg  Bid x 7 days as per our standing orders for patients with UTI preop. Called his wife Jackelyn Poling and [place] at [phone]. Will try to call her for confirmation and instructions for Cipro. Patient is scheduled to have Left CEA with Dr. Donnetta Hutching on 06-08-14.

## 2014-06-06 NOTE — Progress Notes (Signed)
Spoke with Arbie Cookey, RN to make MD aware of abnormal UA.

## 2014-06-07 ENCOUNTER — Encounter (HOSPITAL_COMMUNITY): Payer: Self-pay

## 2014-06-07 MED ORDER — SODIUM CHLORIDE 0.9 % IV SOLN: INTRAVENOUS | Status: DC

## 2014-06-07 MED ORDER — DEXTROSE 5 % IV SOLN
1.5000 g | INTRAVENOUS | Status: AC
  Administered 2014-06-08: 1.5 g via INTRAVENOUS
  Filled 2014-06-07: qty 1.5

## 2014-06-07 MED ORDER — CHLORHEXIDINE GLUCONATE 4 % EX LIQD
60.0000 mL | Freq: Once | CUTANEOUS | Status: DC
  Filled 2014-06-07: qty 60

## 2014-06-07 NOTE — Progress Notes (Signed)
Anesthesia Chart Review:  Patient is a 74 year old male scheduled for left CEA on 06/08/14 by Dr. Donnetta Hutching.  Patient has symptomatic carotid stenosis with hospitalization for two 1 cm areas of acute left hemispheric infarct.   Other history includes BPH, urethral stricture s/p dilation '10, DM2, anxiety, essential tremor, glaucoma, HLD, HTN, reflux esophagitis, hiatal hernia, spinal fusion. Reportedly he had normal coronaries by remote cath in 1986 (Dr. Lia Foyer). For anesthesia history, he reports waking up with ETT and panicking.  PCP is Dr. Jeanmarie Hubert.  Meds include ASA, Lipitor, Plavix, Ativan, Commit, Timoptic. Dr. Donnetta Hutching is having patient continue ASA and Plavix perioperatively.   06/01/14 EKG: SB at 54 bpm, abnormal R wave progression, early transition, non-specific ST abnormality.  06/02/14 Echo: - Procedure narrative: Transthoracic echocardiography. Image quality was poor. The study was technically difficult, as a result of poor acoustic windows. - Left ventricle: The cavity size was normal. There was mild concentric hypertrophy. Systolic function was normal. The estimated ejection fraction was in the range of 60% to 65%. Images were inadequate for LV wall motion assessment. Doppler parameters are consistent with abnormal left ventricular relaxation (grade 1 diastolic dysfunction). - Mitral valve: Mildly calcified annulus. Normal thickness leaflets. - Left atrium: The atrium was mildly dilated. - Right atrium: The atrium was mildly dilated.  06/02/14 Carotid duplex: RIght: intimal wall thickening CCA. Mild mixed plaque origin ICA. 1-39% ICA stenosis. Left: intimal wall thickening CCA. Moderate calcific plaque with acoustic shadowing noted origin ICA. 40-59% ICA stenosis. Lowest range of scale. Bilateral vertebral artery flow is antegrade.  06/02/14 CTA of the neck: IMPRESSION: Motion degraded exam. Severe atherosclerotic disease affecting the ICA bulb on the left with pronounced  irregularity likely to serve as a source of emboli. Numerous serial stenoses, most on the order of 50%. Vessel regains a normal appearance distal to the bulb. Mild atherosclerotic disease at the right carotid bifurcation without stenosis.  09/23/14 CXR: No acute cardiopulmonary abnormality.  Labs from 06/01/14-06/06/14 noted. A1C 5.8.  VVS already aware of UA results and Cipro initiated.   Patient with symptomatic left carotid disease.  L CEA recommended.  If no acute changes then I would anticipate that he could proceed as planned.  George Hugh [facility]/Anesthesiology Phone [phone] 06/07/2014 11:01 AM

## 2014-06-08 ENCOUNTER — Inpatient Hospital Stay (HOSPITAL_COMMUNITY)
Admission: RE | Admit: 2014-06-08 | Discharge: 2014-06-10 | DRG: 038 | Disposition: A | Discharge status: Home / Self Care | Payer: Medicare Other | Source: Ambulatory Visit | Attending: Vascular Surgery | Admitting: Vascular Surgery

## 2014-06-08 ENCOUNTER — Inpatient Hospital Stay (HOSPITAL_COMMUNITY): Payer: Medicare Other | Admitting: Anesthesiology

## 2014-06-08 ENCOUNTER — Encounter (HOSPITAL_COMMUNITY)
Admission: RE | Disposition: A | Discharge status: Home / Self Care | Payer: Self-pay | Source: Ambulatory Visit | Attending: Vascular Surgery

## 2014-06-08 ENCOUNTER — Encounter (HOSPITAL_COMMUNITY): Payer: Self-pay | Admitting: *Deleted

## 2014-06-08 ENCOUNTER — Inpatient Hospital Stay (HOSPITAL_COMMUNITY): Payer: Medicare Other | Admitting: Vascular Surgery

## 2014-06-08 DIAGNOSIS — K219 Gastro-esophageal reflux disease without esophagitis: Secondary | ICD-10-CM | POA: Diagnosis present

## 2014-06-08 DIAGNOSIS — B029 Zoster without complications: Secondary | ICD-10-CM | POA: Diagnosis present

## 2014-06-08 DIAGNOSIS — Z8249 Family history of ischemic heart disease and other diseases of the circulatory system: Secondary | ICD-10-CM

## 2014-06-08 DIAGNOSIS — G250 Essential tremor: Secondary | ICD-10-CM | POA: Diagnosis present

## 2014-06-08 DIAGNOSIS — N401 Enlarged prostate with lower urinary tract symptoms: Secondary | ICD-10-CM | POA: Diagnosis present

## 2014-06-08 DIAGNOSIS — Z79899 Other long term (current) drug therapy: Secondary | ICD-10-CM | POA: Diagnosis not present

## 2014-06-08 DIAGNOSIS — E559 Vitamin D deficiency, unspecified: Secondary | ICD-10-CM | POA: Diagnosis present

## 2014-06-08 DIAGNOSIS — R070 Pain in throat: Secondary | ICD-10-CM | POA: Diagnosis present

## 2014-06-08 DIAGNOSIS — H409 Unspecified glaucoma: Secondary | ICD-10-CM | POA: Diagnosis present

## 2014-06-08 DIAGNOSIS — I129 Hypertensive chronic kidney disease with stage 1 through stage 4 chronic kidney disease, or unspecified chronic kidney disease: Secondary | ICD-10-CM | POA: Diagnosis present

## 2014-06-08 DIAGNOSIS — Z833 Family history of diabetes mellitus: Secondary | ICD-10-CM | POA: Diagnosis not present

## 2014-06-08 DIAGNOSIS — Z8673 Personal history of transient ischemic attack (TIA), and cerebral infarction without residual deficits: Secondary | ICD-10-CM

## 2014-06-08 DIAGNOSIS — Z981 Arthrodesis status: Secondary | ICD-10-CM | POA: Diagnosis not present

## 2014-06-08 DIAGNOSIS — E785 Hyperlipidemia, unspecified: Secondary | ICD-10-CM | POA: Diagnosis present

## 2014-06-08 DIAGNOSIS — N182 Chronic kidney disease, stage 2 (mild): Secondary | ICD-10-CM | POA: Diagnosis present

## 2014-06-08 DIAGNOSIS — Z87891 Personal history of nicotine dependence: Secondary | ICD-10-CM

## 2014-06-08 DIAGNOSIS — G4700 Insomnia, unspecified: Secondary | ICD-10-CM | POA: Diagnosis present

## 2014-06-08 DIAGNOSIS — I63232 Cerebral infarction due to unspecified occlusion or stenosis of left carotid arteries: Secondary | ICD-10-CM | POA: Diagnosis present

## 2014-06-08 DIAGNOSIS — I959 Hypotension, unspecified: Secondary | ICD-10-CM | POA: Diagnosis present

## 2014-06-08 DIAGNOSIS — Z885 Allergy status to narcotic agent status: Secondary | ICD-10-CM

## 2014-06-08 DIAGNOSIS — M545 Low back pain: Secondary | ICD-10-CM | POA: Diagnosis present

## 2014-06-08 DIAGNOSIS — I6529 Occlusion and stenosis of unspecified carotid artery: Secondary | ICD-10-CM | POA: Diagnosis present

## 2014-06-08 DIAGNOSIS — E1122 Type 2 diabetes mellitus with diabetic chronic kidney disease: Secondary | ICD-10-CM | POA: Diagnosis present

## 2014-06-08 DIAGNOSIS — F411 Generalized anxiety disorder: Secondary | ICD-10-CM | POA: Diagnosis present

## 2014-06-08 DIAGNOSIS — Z87442 Personal history of urinary calculi: Secondary | ICD-10-CM | POA: Diagnosis not present

## 2014-06-08 DIAGNOSIS — I6522 Occlusion and stenosis of left carotid artery: Principal | ICD-10-CM | POA: Diagnosis present

## 2014-06-08 DIAGNOSIS — N138 Other obstructive and reflux uropathy: Secondary | ICD-10-CM | POA: Diagnosis present

## 2014-06-08 HISTORY — PX: ENDARTERECTOMY: SHX5162

## 2014-06-08 HISTORY — PX: PATCH ANGIOPLASTY: SHX6230

## 2014-06-08 LAB — CBC
HCT: 39.2 % (ref 39.0–52.0)
Hemoglobin: 12.8 g/dL — ABNORMAL LOW (ref 13.0–17.0)
MCH: 31.1 pg (ref 26.0–34.0)
MCHC: 32.7 g/dL (ref 30.0–36.0)
MCV: 95.4 fL (ref 78.0–100.0)
Platelets: 180 10*3/uL (ref 150–400)
RBC: 4.11 MIL/uL — ABNORMAL LOW (ref 4.22–5.81)
RDW: 13.1 % (ref 11.5–15.5)
WBC: 7.7 10*3/uL (ref 4.0–10.5)

## 2014-06-08 LAB — GLUCOSE, CAPILLARY
GLUCOSE-CAPILLARY: 98 mg/dL (ref 70–99)
GLUCOSE-CAPILLARY: 100 mg/dL — AB (ref 70–99)

## 2014-06-08 LAB — CREATININE, SERUM
Creatinine, Ser: 1.09 mg/dL (ref 0.50–1.35)
GFR calc Af Amer: 77 mL/min — ABNORMAL LOW (ref >90)
GFR, EST NON AFRICAN AMERICAN: 66 mL/min — AB (ref >90)

## 2014-06-08 SURGERY — ENDARTERECTOMY, CAROTID
Anesthesia: General | Site: Neck | Laterality: Left

## 2014-06-08 MED ORDER — ENOXAPARIN SODIUM 40 MG/0.4ML ~~LOC~~ SOLN
40.0000 mg | SUBCUTANEOUS | Status: DC
  Administered 2014-06-09: 40 mg via SUBCUTANEOUS
  Filled 2014-06-08 (×2): qty 0.4

## 2014-06-08 MED ORDER — PROTAMINE SULFATE 10 MG/ML IV SOLN
INTRAVENOUS | Status: DC | PRN
  Administered 2014-06-08: 20 mg via INTRAVENOUS
  Administered 2014-06-08: 10 mg via INTRAVENOUS
  Administered 2014-06-08: 20 mg via INTRAVENOUS

## 2014-06-08 MED ORDER — DOPAMINE-DEXTROSE 3.2-5 MG/ML-% IV SOLN
0.0000 ug/kg/min | INTRAVENOUS | Status: DC
Start: 2014-06-08 — End: 2014-06-10
  Administered 2014-06-08: 6.645 ug/kg/min via INTRAVENOUS
  Administered 2014-06-08: 3 ug/kg/min via INTRAVENOUS
  Filled 2014-06-08: qty 250

## 2014-06-08 MED ORDER — LABETALOL HCL 5 MG/ML IV SOLN: 10.0000 mg | INTRAVENOUS | Status: DC | PRN

## 2014-06-08 MED ORDER — ROCURONIUM BROMIDE 50 MG/5ML IV SOLN
INTRAVENOUS | Status: AC
  Filled 2014-06-08: qty 1

## 2014-06-08 MED ORDER — DEXTROSE 5 % IV SOLN
0.0000 ug/min | INTRAVENOUS | Status: DC
  Filled 2014-06-08: qty 1

## 2014-06-08 MED ORDER — CIPROFLOXACIN HCL 500 MG PO TABS
500.0000 mg | ORAL_TABLET | Freq: Two times a day (BID) | ORAL | Status: DC
  Administered 2014-06-08 – 2014-06-10 (×4): 500 mg via ORAL
  Filled 2014-06-08 (×7): qty 1

## 2014-06-08 MED ORDER — PHENOL 1.4 % MT LIQD
1.0000 | OROMUCOSAL | Status: DC | PRN
  Filled 2014-06-08: qty 177

## 2014-06-08 MED ORDER — MAGNESIUM SULFATE 2 GM/50ML IV SOLN: 2.0000 g | Freq: Every day | INTRAVENOUS | Status: DC | PRN

## 2014-06-08 MED ORDER — EPHEDRINE SULFATE 50 MG/ML IJ SOLN
INTRAMUSCULAR | Status: DC | PRN
  Administered 2014-06-08 (×2): 10 mg via INTRAVENOUS

## 2014-06-08 MED ORDER — FENTANYL CITRATE 0.05 MG/ML IJ SOLN
INTRAMUSCULAR | Status: DC | PRN
  Administered 2014-06-08 (×5): 50 ug via INTRAVENOUS

## 2014-06-08 MED ORDER — 0.9 % SODIUM CHLORIDE (POUR BTL) OPTIME
TOPICAL | Status: DC | PRN
  Administered 2014-06-08: 2000 mL

## 2014-06-08 MED ORDER — ACETAMINOPHEN 10 MG/ML IV SOLN
INTRAVENOUS | Status: AC
  Filled 2014-06-08: qty 100

## 2014-06-08 MED ORDER — LACTATED RINGERS IV SOLN
INTRAVENOUS | Status: DC | PRN
  Administered 2014-06-08 (×2): via INTRAVENOUS

## 2014-06-08 MED ORDER — HEPARIN SODIUM (PORCINE) 1000 UNIT/ML IJ SOLN
INTRAMUSCULAR | Status: DC | PRN
  Administered 2014-06-08: 8000 [IU] via INTRAVENOUS

## 2014-06-08 MED ORDER — ONDANSETRON HCL 4 MG/2ML IJ SOLN
INTRAMUSCULAR | Status: AC
  Filled 2014-06-08: qty 2

## 2014-06-08 MED ORDER — SODIUM CHLORIDE 0.9 % IJ SOLN
INTRAMUSCULAR | Status: AC
  Filled 2014-06-08: qty 10

## 2014-06-08 MED ORDER — FENTANYL CITRATE 0.05 MG/ML IJ SOLN
INTRAMUSCULAR | Status: AC
  Filled 2014-06-08: qty 5

## 2014-06-08 MED ORDER — NEOSTIGMINE METHYLSULFATE 10 MG/10ML IV SOLN
INTRAVENOUS | Status: DC | PRN
  Administered 2014-06-08: 3 mg via INTRAVENOUS

## 2014-06-08 MED ORDER — HYDRALAZINE HCL 20 MG/ML IJ SOLN: 5.0000 mg | INTRAMUSCULAR | Status: DC | PRN

## 2014-06-08 MED ORDER — ONDANSETRON HCL 4 MG/2ML IJ SOLN
4.0000 mg | Freq: Four times a day (QID) | INTRAMUSCULAR | Status: DC | PRN
Start: 2014-06-08 — End: 2014-06-10
  Administered 2014-06-08 (×3): 4 mg via INTRAVENOUS
  Filled 2014-06-08 (×2): qty 2

## 2014-06-08 MED ORDER — SUCCINYLCHOLINE CHLORIDE 20 MG/ML IJ SOLN
INTRAMUSCULAR | Status: AC
  Filled 2014-06-08: qty 1

## 2014-06-08 MED ORDER — PROMETHAZINE HCL 25 MG/ML IJ SOLN
INTRAMUSCULAR | Status: AC
  Filled 2014-06-08: qty 1

## 2014-06-08 MED ORDER — OXYCODONE HCL 5 MG PO TABS: 5.0000 mg | ORAL_TABLET | Freq: Once | ORAL | Status: DC | PRN

## 2014-06-08 MED ORDER — PHENYLEPHRINE HCL 10 MG/ML IJ SOLN
10.0000 mg | INTRAVENOUS | Status: DC | PRN
  Administered 2014-06-08: 30 ug/min via INTRAVENOUS

## 2014-06-08 MED ORDER — BISACODYL 10 MG RE SUPP: 10.0000 mg | Freq: Every day | RECTAL | Status: DC | PRN

## 2014-06-08 MED ORDER — METOPROLOL TARTRATE 1 MG/ML IV SOLN: 2.0000 mg | INTRAVENOUS | Status: DC | PRN

## 2014-06-08 MED ORDER — PANTOPRAZOLE SODIUM 40 MG PO TBEC
40.0000 mg | DELAYED_RELEASE_TABLET | Freq: Every day | ORAL | Status: DC
  Administered 2014-06-08 – 2014-06-09 (×2): 40 mg via ORAL
  Filled 2014-06-08 (×2): qty 1

## 2014-06-08 MED ORDER — HEPARIN SODIUM (PORCINE) 1000 UNIT/ML IJ SOLN
INTRAMUSCULAR | Status: AC
  Filled 2014-06-08: qty 1

## 2014-06-08 MED ORDER — CLOPIDOGREL BISULFATE 75 MG PO TABS
75.0000 mg | ORAL_TABLET | Freq: Every day | ORAL | Status: DC
Start: 2014-06-09 — End: 2014-06-10
  Administered 2014-06-09: 75 mg via ORAL
  Filled 2014-06-08 (×2): qty 1

## 2014-06-08 MED ORDER — NICOTINE POLACRILEX 2 MG MT LOZG
2.0000 mg | LOZENGE | Freq: Three times a day (TID) | OROMUCOSAL | Status: DC
  Administered 2014-06-08 – 2014-06-09 (×4): 2 mg via ORAL
  Filled 2014-06-08 (×9): qty 1

## 2014-06-08 MED ORDER — PROPOFOL 10 MG/ML IV BOLUS
INTRAVENOUS | Status: AC
Start: 2014-06-08 — End: 2014-06-08
  Filled 2014-06-08: qty 20

## 2014-06-08 MED ORDER — ACETAMINOPHEN 325 MG PO TABS
325.0000 mg | ORAL_TABLET | ORAL | Status: DC | PRN
  Administered 2014-06-09 – 2014-06-10 (×2): 650 mg via ORAL
  Filled 2014-06-08 (×3): qty 2

## 2014-06-08 MED ORDER — MORPHINE SULFATE 2 MG/ML IJ SOLN
1.0000 mg | INTRAMUSCULAR | Status: DC | PRN
  Filled 2014-06-08: qty 1

## 2014-06-08 MED ORDER — ASPIRIN EC 81 MG PO TBEC
81.0000 mg | DELAYED_RELEASE_TABLET | Freq: Every day | ORAL | Status: DC
  Administered 2014-06-09: 81 mg via ORAL
  Filled 2014-06-08 (×2): qty 1

## 2014-06-08 MED ORDER — ONDANSETRON HCL 4 MG/2ML IJ SOLN
INTRAMUSCULAR | Status: DC | PRN
  Administered 2014-06-08: 4 mg via INTRAVENOUS

## 2014-06-08 MED ORDER — PHENYLEPHRINE 40 MCG/ML (10ML) SYRINGE FOR IV PUSH (FOR BLOOD PRESSURE SUPPORT)
PREFILLED_SYRINGE | INTRAVENOUS | Status: AC
  Filled 2014-06-08: qty 10

## 2014-06-08 MED ORDER — SODIUM CHLORIDE 0.9 % IV SOLN
500.0000 mL | Freq: Once | INTRAVENOUS | Status: AC | PRN
  Administered 2014-06-08: 500 mL via INTRAVENOUS

## 2014-06-08 MED ORDER — HYDROMORPHONE HCL 1 MG/ML IJ SOLN: 0.2500 mg | INTRAMUSCULAR | Status: DC | PRN

## 2014-06-08 MED ORDER — LIDOCAINE HCL (CARDIAC) 20 MG/ML IV SOLN
INTRAVENOUS | Status: AC
  Filled 2014-06-08: qty 10

## 2014-06-08 MED ORDER — LIDOCAINE HCL (CARDIAC) 20 MG/ML IV SOLN
INTRAVENOUS | Status: AC
  Filled 2014-06-08: qty 5

## 2014-06-08 MED ORDER — EPHEDRINE SULFATE 50 MG/ML IJ SOLN
INTRAMUSCULAR | Status: AC
  Filled 2014-06-08: qty 1

## 2014-06-08 MED ORDER — ROCURONIUM BROMIDE 100 MG/10ML IV SOLN
INTRAVENOUS | Status: DC | PRN
  Administered 2014-06-08: 40 mg via INTRAVENOUS

## 2014-06-08 MED ORDER — ACETAMINOPHEN 325 MG RE SUPP
325.0000 mg | RECTAL | Status: DC | PRN
  Filled 2014-06-08: qty 2

## 2014-06-08 MED ORDER — LIDOCAINE HCL (PF) 1 % IJ SOLN
INTRAMUSCULAR | Status: AC
Start: 2014-06-08 — End: 2014-06-08
  Filled 2014-06-08: qty 30

## 2014-06-08 MED ORDER — ATORVASTATIN CALCIUM 40 MG PO TABS
40.0000 mg | ORAL_TABLET | Freq: Every day | ORAL | Status: DC
  Administered 2014-06-08 – 2014-06-09 (×2): 40 mg via ORAL
  Filled 2014-06-08 (×4): qty 1

## 2014-06-08 MED ORDER — TIMOLOL MALEATE 0.25 % OP SOLN
1.0000 [drp] | Freq: Two times a day (BID) | OPHTHALMIC | Status: DC
  Administered 2014-06-08 – 2014-06-09 (×3): 1 [drp] via OPHTHALMIC
  Filled 2014-06-08: qty 5

## 2014-06-08 MED ORDER — SODIUM CHLORIDE 0.9 % IV SOLN
INTRAVENOUS | Status: DC
  Administered 2014-06-08: 16:00:00 via INTRAVENOUS

## 2014-06-08 MED ORDER — PROMETHAZINE HCL 25 MG/ML IJ SOLN
6.2500 mg | INTRAMUSCULAR | Status: DC | PRN
Start: 2014-06-08 — End: 2014-06-08
  Administered 2014-06-08: 6.25 mg via INTRAVENOUS

## 2014-06-08 MED ORDER — DEXTROSE 5 % IV SOLN
1.5000 g | Freq: Two times a day (BID) | INTRAVENOUS | Status: AC
  Administered 2014-06-08 – 2014-06-09 (×2): 1.5 g via INTRAVENOUS
  Filled 2014-06-08 (×2): qty 1.5

## 2014-06-08 MED ORDER — PHENYLEPHRINE HCL 10 MG/ML IJ SOLN
INTRAMUSCULAR | Status: DC | PRN
  Administered 2014-06-08: 80 ug via INTRAVENOUS

## 2014-06-08 MED ORDER — OXYCODONE-ACETAMINOPHEN 5-325 MG PO TABS
1.0000 | ORAL_TABLET | ORAL | Status: DC | PRN
  Administered 2014-06-08 (×2): 2 via ORAL
  Administered 2014-06-09: 1 via ORAL
  Filled 2014-06-08: qty 2
  Filled 2014-06-08: qty 1
  Filled 2014-06-08: qty 2

## 2014-06-08 MED ORDER — LORAZEPAM 0.5 MG PO TABS
0.5000 mg | ORAL_TABLET | Freq: Three times a day (TID) | ORAL | Status: DC | PRN
  Administered 2014-06-09: 0.5 mg via ORAL
  Filled 2014-06-08: qty 1

## 2014-06-08 MED ORDER — HEPARIN SODIUM (PORCINE) 5000 UNIT/ML IJ SOLN
INTRAMUSCULAR | Status: DC | PRN
  Administered 2014-06-08: 500 mL

## 2014-06-08 MED ORDER — DOCUSATE SODIUM 100 MG PO CAPS
100.0000 mg | ORAL_CAPSULE | Freq: Every day | ORAL | Status: DC
  Administered 2014-06-09: 100 mg via ORAL
  Filled 2014-06-08: qty 1

## 2014-06-08 MED ORDER — ACETAMINOPHEN 325 MG PO TABS
ORAL_TABLET | ORAL | Status: AC
  Filled 2014-06-08: qty 2

## 2014-06-08 MED ORDER — GLYCOPYRROLATE 0.2 MG/ML IJ SOLN
INTRAMUSCULAR | Status: DC | PRN
  Administered 2014-06-08: 0.4 mg via INTRAVENOUS
  Administered 2014-06-08: 0.2 mg via INTRAVENOUS
  Administered 2014-06-08: 0.4 mg via INTRAVENOUS

## 2014-06-08 MED ORDER — PROPOFOL 10 MG/ML IV BOLUS
INTRAVENOUS | Status: DC | PRN
  Administered 2014-06-08: 150 mg via INTRAVENOUS

## 2014-06-08 MED ORDER — OXYCODONE HCL 5 MG/5ML PO SOLN: 5.0000 mg | Freq: Once | ORAL | Status: DC | PRN

## 2014-06-08 MED ORDER — PHENYLEPHRINE HCL 10 MG/ML IJ SOLN: 0.0000 ug/min | INTRAVENOUS | Status: DC

## 2014-06-08 MED ORDER — ALUM & MAG HYDROXIDE-SIMETH 200-200-20 MG/5ML PO SUSP
15.0000 mL | ORAL | Status: DC | PRN
  Administered 2014-06-09 (×2): 30 mL via ORAL
  Filled 2014-06-08 (×2): qty 30

## 2014-06-08 MED ORDER — LIDOCAINE HCL (CARDIAC) 20 MG/ML IV SOLN
INTRAVENOUS | Status: DC | PRN
  Administered 2014-06-08: 80 mg via INTRAVENOUS
  Administered 2014-06-08: 80 mg via INTRATRACHEAL

## 2014-06-08 MED ORDER — GUAIFENESIN-DM 100-10 MG/5ML PO SYRP: 15.0000 mL | ORAL_SOLUTION | ORAL | Status: DC | PRN

## 2014-06-08 MED ORDER — POTASSIUM CHLORIDE CRYS ER 20 MEQ PO TBCR: 20.0000 meq | EXTENDED_RELEASE_TABLET | Freq: Every day | ORAL | Status: DC | PRN

## 2014-06-08 MED ORDER — MORPHINE SULFATE 2 MG/ML IJ SOLN: 2.0000 mg | INTRAMUSCULAR | Status: DC | PRN

## 2014-06-08 MED ORDER — DOPAMINE-DEXTROSE 3.2-5 MG/ML-% IV SOLN
INTRAVENOUS | Status: AC
  Filled 2014-06-08: qty 250

## 2014-06-08 SURGICAL SUPPLY — 49 items
BENZOIN TINCTURE PRP APPL 2/3 (GAUZE/BANDAGES/DRESSINGS) ×3 IMPLANT
CANISTER SUCTION 2500CC (MISCELLANEOUS) ×3 IMPLANT
CANNULA VESSEL 3MM 2 BLNT TIP (CANNULA) ×3 IMPLANT
CATH ROBINSON RED A/P 18FR (CATHETERS) ×3 IMPLANT
CLIP LIGATING EXTRA MED SLVR (CLIP) ×3 IMPLANT
CLIP LIGATING EXTRA SM BLUE (MISCELLANEOUS) ×3 IMPLANT
CLOSURE STERI-STRIP 1/2X4 (GAUZE/BANDAGES/DRESSINGS) ×1
CLOSURE WOUND 1/2 X4 (GAUZE/BANDAGES/DRESSINGS) ×1
CLSR STERI-STRIP ANTIMIC 1/2X4 (GAUZE/BANDAGES/DRESSINGS) ×2 IMPLANT
CRADLE DONUT ADULT HEAD (MISCELLANEOUS) ×3 IMPLANT
DECANTER SPIKE VIAL GLASS SM (MISCELLANEOUS) IMPLANT
DRAIN HEMOVAC 1/8 X 5 (WOUND CARE) IMPLANT
DRSG COVADERM 4X8 (GAUZE/BANDAGES/DRESSINGS) ×3 IMPLANT
ELECT REM PT RETURN 9FT ADLT (ELECTROSURGICAL) ×3
ELECTRODE REM PT RTRN 9FT ADLT (ELECTROSURGICAL) ×1 IMPLANT
EVACUATOR SILICONE 100CC (DRAIN) IMPLANT
GAUZE SPONGE 4X4 12PLY STRL (GAUZE/BANDAGES/DRESSINGS) ×3 IMPLANT
GEL ULTRASOUND 20GR AQUASONIC (MISCELLANEOUS) IMPLANT
GLOVE BIOGEL M 6.5 STRL (GLOVE) ×3 IMPLANT
GLOVE BIOGEL PI IND STRL 6.5 (GLOVE) ×3 IMPLANT
GLOVE BIOGEL PI INDICATOR 6.5 (GLOVE) ×6
GLOVE ECLIPSE 7.5 STRL STRAW (GLOVE) ×3 IMPLANT
GLOVE SS BIOGEL STRL SZ 7.5 (GLOVE) ×1 IMPLANT
GLOVE SUPERSENSE BIOGEL SZ 7.5 (GLOVE) ×2
GLOVE SURG SS PI 6.5 STRL IVOR (GLOVE) ×3 IMPLANT
GOWN STRL REUS W/ TWL LRG LVL3 (GOWN DISPOSABLE) ×3 IMPLANT
GOWN STRL REUS W/ TWL XL LVL3 (GOWN DISPOSABLE) ×1 IMPLANT
GOWN STRL REUS W/TWL LRG LVL3 (GOWN DISPOSABLE) ×6
GOWN STRL REUS W/TWL XL LVL3 (GOWN DISPOSABLE) ×2
KIT BASIN OR (CUSTOM PROCEDURE TRAY) ×3 IMPLANT
KIT ROOM TURNOVER OR (KITS) ×3 IMPLANT
NEEDLE 22X1 1/2 (OR ONLY) (NEEDLE) IMPLANT
NS IRRIG 1000ML POUR BTL (IV SOLUTION) ×6 IMPLANT
PACK CAROTID (CUSTOM PROCEDURE TRAY) ×3 IMPLANT
PAD ARMBOARD 7.5X6 YLW CONV (MISCELLANEOUS) ×6 IMPLANT
PATCH HEMASHIELD 8X75 (Vascular Products) ×3 IMPLANT
SHUNT CAROTID BYPASS 10 (VASCULAR PRODUCTS) ×3 IMPLANT
SHUNT CAROTID BYPASS 12FRX15.5 (VASCULAR PRODUCTS) IMPLANT
SPONGE INTESTINAL PEANUT (DISPOSABLE) ×3 IMPLANT
STRIP CLOSURE SKIN 1/2X4 (GAUZE/BANDAGES/DRESSINGS) ×2 IMPLANT
SUT ETHILON 3 0 PS 1 (SUTURE) IMPLANT
SUT PROLENE 6 0 CC (SUTURE) ×15 IMPLANT
SUT SILK 3 0 (SUTURE)
SUT SILK 3-0 18XBRD TIE 12 (SUTURE) IMPLANT
SUT VIC AB 3-0 SH 27 (SUTURE) ×4
SUT VIC AB 3-0 SH 27X BRD (SUTURE) ×2 IMPLANT
SUT VICRYL 4-0 PS2 18IN ABS (SUTURE) ×3 IMPLANT
SYR CONTROL 10ML LL (SYRINGE) IMPLANT
WATER STERILE IRR 1000ML POUR (IV SOLUTION) ×3 IMPLANT

## 2014-06-08 NOTE — Anesthesia Preprocedure Evaluation (Addendum)
Anesthesia Evaluation  Patient identified by MRN, date of birth, ID band Patient awake    Reviewed: Allergy & Precautions, NPO status , Patient's Chart, lab work & pertinent test results  Airway Mallampati: II  TM Distance: >3 FB Neck ROM: Full    Dental  (+) Partial Upper, Lower Dentures   Pulmonary former smoker,  breath sounds clear to auscultation        Cardiovascular hypertension, Pt. on medications + Peripheral Vascular Disease Rhythm:Regular Rate:Normal     Neuro/Psych Anxiety TIA   GI/Hepatic Neg liver ROS, hiatal hernia,   Endo/Other  diabetes, Type 2Morbid obesity  Renal/GU negative Renal ROS     Musculoskeletal   Abdominal   Peds  Hematology negative hematology ROS (+)   Anesthesia Other Findings   Reproductive/Obstetrics                            Anesthesia Physical Anesthesia Plan  ASA: III  Anesthesia Plan: General   Post-op Pain Management:    Induction: Intravenous  Airway Management Planned: Oral ETT  Additional Equipment: Arterial line  Intra-op Plan:   Post-operative Plan: Extubation in OR  Informed Consent: I have reviewed the patients History and Physical, chart, labs and discussed the procedure including the risks, benefits and alternatives for the proposed anesthesia with the patient or authorized representative who has indicated his/her understanding and acceptance.   Dental advisory given  Plan Discussed with: CRNA  Anesthesia Plan Comments:         Anesthesia Quick Evaluation

## 2014-06-08 NOTE — OR Nursing (Signed)
Dr. Donnetta Hutching informed of SBP and pertinent assessments, interventions, and reponses. Orders recvd.

## 2014-06-08 NOTE — Transfer of Care (Signed)
Immediate Anesthesia Transfer of Care Note  Patient: Marcus [name]  Procedure(s) Performed: Procedure(s): LEFT CAROTID ARTERY ENDARTERECTOMY (Left) WITH DACRON PATCH ANGIOPLASTY (Left)  Patient Location: PACU  Anesthesia Type:General  Level of Consciousness: awake, alert , pateint uncooperative and confused  Airway & Oxygen Therapy: Patient Spontanous Breathing and Patient connected to nasal cannula oxygen  Post-op Assessment: Report given to RN, Post -op Vital signs reviewed and stable, Patient moving all extremities and Patient able to stick tongue midline  Post vital signs: Reviewed and stable  Last Vitals:  Filed Vitals:   06/08/14 0556  BP:   Pulse:   Temp: 36.6 C  Resp:     Complications: No apparent anesthesia complications

## 2014-06-08 NOTE — Progress Notes (Signed)
Patient ID: Marcus [name], male   DOB: 27-Jul-1940, 74 y.o.   MRN: [id number] Doristine Devoid deal of blood pressure fluctuation in surgery and has done so initially in PACU as well. Remains neurologically intact. Had the hypotension in the 70s to 80s with the not much response to volume. Is now on low-dose dopamine with a blood pressure 120 and heart rate of 85.  Interesting the patient had a episode of right arm clumsiness this morning prior to coming to the hospital. Certainly concerning for recurrent left brain ischemia. Had an extremely irregular plaque time of surgery the normal internal carotid artery distally. Discussed with the wife and other family members. Will transfer to 3 S. I will for discharge in the morning if he remains stable

## 2014-06-08 NOTE — Interval H&P Note (Signed)
History and Physical Interval Note:  06/08/2014 6:58 AM  Marcus [name]  has presented today for surgery, with the diagnosis of Left carotid stenosis with history of stroke I63.239  The various methods of treatment have been discussed with the patient and family. After consideration of risks, benefits and other options for treatment, the patient has consented to  Procedure(s): ENDARTERECTOMY CAROTID (Left) as a surgical intervention .  The patient's history has been reviewed, patient examined, no change in status, stable for surgery.  I have reviewed the patient's chart and labs.  Questions were answered to the patient's satisfaction.     EARLY, TODD

## 2014-06-08 NOTE — H&P (View-Only) (Signed)
Patient name: Marcus Tu Sr. MRN: [id number] DOB: 25-Mar-1940 Sex: male   Referred by: Karleen Hampshire  Reason for referral:  Chief Complaint  Patient presents with  . Transient Ischemic Attack    HISTORY OF PRESENT ILLNESS: The patient is an active healthy 74 year old gentleman who on 06/01/2014 had an episode of right second and third finger numbness and tingling and expressive aphasia. He was admitted for stroke workup. He reports he has returned to his complete normal baseline with no deficits and no speech disturbances. He denies any prior history of similar events. He has had no episodes of amaurosis or prior aphasia or weakness. Has no prior cardiac history or other peripheral vascular occlusive disease history. He does not smoke cigarettes. He is quite active and works 50 hours per week.  Past Medical History  Diagnosis Date  . Unspecified vitamin D deficiency   . Urethral stricture unspecified   . Hypertrophy of prostate with urinary obstruction and other lower urinary tract symptoms (LUTS)   . Type II or unspecified type diabetes mellitus without mention of complication, not stated as uncontrolled   . Anxiety state, unspecified   . 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 Urinary frequency   . Insomnia, unspecified   . Unspecified glaucoma   . 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 Calculus of kidney   . Other and unspecified hyperlipidemia   . Reflux esophagitis   . Lumbago   . Unspecified essential hypertension   . Herpes zoster without mention of complication   . Glaucoma     Past Surgical History  Procedure Laterality Date  . Spinal fusion  12/91,11/91    obtained bone from left lower leg  . Eye surgery  2014    cataract  . Eye surgery  2013    Retina  . Cardiac catheterization  1986    normal, Dr Lia Foyer  . Stress thallium  1994     normal;  . Stretching bladder  07/2007    Dr Risa Grill    History   Social History  . Marital Status: Married    Spouse Name: N/A  . Number of Children: N/A  . Years of Education: N/A   Occupational History  . Not on file.   Social History Main Topics  . Smoking status: Former Research scientist (life sciences)  . Smokeless tobacco: Not on file  . Alcohol Use: No  . Drug Use: No  . Sexual Activity: Yes    Birth Control/ Protection: None   Other Topics Concern  . Not on file   Social History Narrative    Family History  Problem Relation Age of Onset  . Diabetes Mother   . Cancer Mother   . Hypertension Father   . Stroke Father   . Alcohol abuse Brother     Allergies as of 06/01/2014 - Review Complete 06/01/2014  Allergen Reaction Noted  . Codeine Other (See Comments) 09/27/2013  . Demerol [meperidine] Other (See Comments) 07/19/2012    No current facility-administered medications on file prior to encounter.   Current Outpatient Prescriptions on File Prior to Encounter  Medication Sig Dispense Refill  . LORazepam (ATIVAN) 0.5 MG tablet Take one tablet by mouth every eight hours as needed for anxiety 90 tablet 1  . nicotine polacrilex (COMMIT) 2 MG lozenge Take 2 mg by mouth 3 (three) times daily.     . timolol (TIMOPTIC) 0.25 % ophthalmic solution Place 1 drop into both eyes 2 (two)  times daily.       REVIEW OF SYSTEMS:  Positives indicated with an "X"  CARDIOVASCULAR:  [ ]  chest pain   [ ]  chest pressure   [ ]  palpitations   [ ]  orthopnea   [ ]  dyspnea on exertion   [ ]  claudication   [ ]  rest pain   [ ]  DVT   [ ]  phlebitis PULMONARY:   [ ]  productive cough   [ ]  asthma   [ ]  wheezing NEUROLOGIC:   [ ]  weakness  [ ]  paresthesias  [ ]  aphasia  [ ]  amaurosis  [ ]  dizziness HEMATOLOGIC:   [ ]  bleeding problems   [ ]  clotting disorders MUSCULOSKELETAL:  [ ]  joint pain   [ ]  joint swelling GASTROINTESTINAL: [ ]   blood in stool  [ ]   hematemesis GENITOURINARY:  [ ]   dysuria  [ ]   hematuria PSYCHIATRIC:  [ ]  history of major depression INTEGUMENTARY:  [ ]  rashes  [ ]  ulcers CONSTITUTIONAL:  [ ]  fever    [ ]  chills  PHYSICAL EXAMINATION:  General: The patient is a well-nourished male, in no acute distress. Vital signs are BP 132/87 mmHg  Pulse 62  Temp(Src) 98 F (36.7 C) (Oral)  Resp 16  Ht 5\' 7"  (1.702 m)  Wt 201 lb 4 oz (91.286 kg)  BMI 31.51 kg/m2  SpO2 99% Pulmonary: There is a good air exchange  Musculoskeletal: There are no major deformities.  There is no significant extremity pain. Neurologic: No focal weakness or paresthesias are detected, Skin: There are no ulcer or rashes noted. Psychiatric: The patient has normal affect.    I reviewed his imaging studies to include MRI and CT of his neck. This did show an acute stroke with 2 separate areas of centimeter infarctions in the left hemisphere consistent with emboli to the left middle cerebral artery  CT angiogram of his neck showed severe plaque formation with at least 50% stenosis in the bifurcation in the left carotid  Impression and Plan:  Left brain stroke. Extensive workup shows no other source. Does have a very irregular 50% stenosis in the left carotid bifurcation which is the most likely cause for his stroke. I have recommended left carotid endarterectomy for reduction of stroke risk. I explained the timing of this. Explained that there is some risk of converting this to a hemorrhagic stroke with acute surgery. Also explain the significant risk of recurrent stroke with prolonged waiting. Since she's had complete resolution with no deficit. I would recommend left carotid endarterectomy on 06/08/2014. Filled the patient is satisfactory for discharge to home today. Will continue his aspirin and Plavix and will undergo surgery on 06/08/2014. Explained expected 1 hospitalization. Also explain the slight risk of stroke related to surgery. I gave him my contact information to let me know if he has any new deficits. Otherwise will be admitted for surgery later next week    Isay Perleberg [facility]  [place]: [phone]

## 2014-06-08 NOTE — Anesthesia Procedure Notes (Signed)
Procedure Name: Intubation Date/Time: 06/08/2014 7:47 AM Performed by: Julian Reil Pre-anesthesia Checklist: Patient identified, Emergency Drugs available, Suction available and Patient being monitored Patient Re-evaluated:Patient Re-evaluated prior to inductionOxygen Delivery Method: Circle system utilized Preoxygenation: Pre-oxygenation with 100% oxygen Intubation Type: IV induction Ventilation: Mask ventilation without difficulty and Oral airway inserted - appropriate to patient size Laryngoscope Size: Mac and 4 Grade View: Grade I Tube type: Oral Tube size: 7.5 mm Number of attempts: 1 Airway Equipment and Method: LTA kit utilized and Stylet Placement Confirmation: ETT inserted through vocal cords under direct vision,  positive ETCO2 and breath sounds checked- equal and bilateral Secured at: 22 cm Tube secured with: Tape Dental Injury: Teeth and Oropharynx as per pre-operative assessment

## 2014-06-08 NOTE — Anesthesia Postprocedure Evaluation (Signed)
  Anesthesia Post-op Note  Patient: Marcus [name]  Procedure(s) Performed: Procedure(s): LEFT CAROTID ARTERY ENDARTERECTOMY (Left) WITH DACRON PATCH ANGIOPLASTY (Left)  Patient Location: PACU  Anesthesia Type:General  Level of Consciousness: awake and alert   Airway and Oxygen Therapy: Patient Spontanous Breathing  Post-op Pain: mild  Post-op Assessment: Post-op Vital signs reviewed  Post-op Vital Signs: Reviewed  Last Vitals:  Filed Vitals:   06/08/14 1330  BP: 117/57  Pulse: 74  Temp: 36.7 C  Resp: 17    Complications: No apparent anesthesia complications

## 2014-06-08 NOTE — Op Note (Signed)
     Patient name: Marcus [name] MRN: [id number] DOB: 12-Aug-1940 Sex: male  06/08/2014 Pre-operative Diagnosis: Symptomatic left carotid stenosis Post-operative diagnosis:  Same Surgeon:  Rosetta Posner, M.D. Assistants:  Earl Lagos Procedure:    left carotid Endarterectomy with Dacron patch angioplasty Anesthesia:  General Blood Loss:  See anesthesia record   Indications for surgery:  Symptomatic left carotid stenosis  Procedure in detail:  The patient was taken to the operating and placed in the supine position. The neck was prepped and draped in the usual sterile fashion. An incision was made anterior to the sternocleidomastoid muscle and continued with electrocautery through the platysma muscle. The muscle was retracted posteriorly and the carotid sheath was opened. The facial vein was ligated with 2-0 silk ties and divided. The common carotid artery was encircled with an umbilical tape and Rummel tourniquet. Dissection was continued onto the carotid bifurcation. The superior thyroid artery was controlled with a 2-0 silk Potts tie. The external carotid organ was encircled with a vessel loop and the internal carotid was encircled with umbilical tape and Rummel tourniquet. The hypoglossal and vagus nerves were identified and preserved.  The patient was given systemic heparinization. After adequate circulation time, the internal,external and common carotid arteries were occluded. The common carotid was opened with an 11 blade and the arteriotomy was continued with Potts scissors onto the internal carotid artery. A 10 shunt was passed up the internal carotid artery, allowed to back bleed, and then passed down the common carotid artery. The shunt was secured with Rummel tourniquet. The endarterectomy was begun on the common carotid artery  plaque was divided proximally with Potts scissors. The endarterectomy was continued onto the carotid bifurcation. The external carotid was endarterectomized by eversion  technique and the internal carotid artery was endarterectomized in an open fashion. Remaining debris was removed from the endarterectomy plane. A Dacron patch was brought to the field and sewn as a patch angioplasty. Prior to completing the anastomosis, the shunt was removed and the usual flushing maneuvers were undertaken. The anastomosis was then completed and flow was restored first to the external and then the internal carotid artery. Excellent flow characteristics were noted with hand-held Doppler in the internal and external carotid arteries.  The patient was given protamine to reverse the heparin. Hemostasis was obtained with electrocautery. The wounds were irrigated with saline. The wound was closed by first reapproximating the sternocleidomastoid muscle over the carotid artery with interrupted 3-0 Vicryl sutures. Next, the platysma was closed with a running 3-0 Vicryl suture. The skin was closed with a 4-0 subcuticular Vicryl suture. Benzoin and Steri-Strips were applied to the incision. A sterile dressing was placed over the incision. All sponge and needle counts were correct. The patient was awakened in the operating room, neurologically intact. They were transferred to the PACU in stable condition.  Carotid stenosis at surgery: 60-70%. Severe irregular plaque  Disposition:  To PACU in stable condition,neurologically intact  Relevant Operative Details:  Very severely irregular plaque with a friable irregular lining extending up into internal carotid artery.  Rosetta Posner, M.D. [facility] [place] Office: [phone] Pager:  [phone]

## 2014-06-09 LAB — CBC
HEMATOCRIT: 37.1 % — AB (ref 39.0–52.0)
Hemoglobin: 12.4 g/dL — ABNORMAL LOW (ref 13.0–17.0)
MCH: 31.6 pg (ref 26.0–34.0)
MCHC: 33.4 g/dL (ref 30.0–36.0)
MCV: 94.4 fL (ref 78.0–100.0)
Platelets: 176 10*3/uL (ref 150–400)
RBC: 3.93 MIL/uL — AB (ref 4.22–5.81)
RDW: 12.8 % (ref 11.5–15.5)
WBC: 5.6 10*3/uL (ref 4.0–10.5)

## 2014-06-09 LAB — BASIC METABOLIC PANEL
ANION GAP: 9 (ref 5–15)
BUN: 11 mg/dL (ref 6–23)
CALCIUM: 8.1 mg/dL — AB (ref 8.4–10.5)
CO2: 25 mmol/L (ref 19–32)
CREATININE: 1.06 mg/dL (ref 0.50–1.35)
Chloride: 103 mmol/L (ref 96–112)
GFR calc non Af Amer: 69 mL/min — ABNORMAL LOW (ref >90)
GFR, EST AFRICAN AMERICAN: 80 mL/min — AB (ref >90)
Glucose, Bld: 129 mg/dL — ABNORMAL HIGH (ref 70–99)
POTASSIUM: 3.8 mmol/L (ref 3.5–5.1)
Sodium: 137 mmol/L (ref 135–145)

## 2014-06-09 MED ORDER — OXYCODONE-ACETAMINOPHEN 5-325 MG PO TABS: 1.0000 | ORAL_TABLET | Freq: Four times a day (QID) | ORAL | Status: DC | PRN

## 2014-06-09 NOTE — Progress Notes (Addendum)
  [facility] Progress Note  06/09/2014 7:53 AM 1 Day Post-Op  Subjective:  Doing well. Throat pain improved.   Tmax 98.6 BP sys 60s-130s 02 100% RA  Filed Vitals:   06/09/14 0700  BP: 119/47  Pulse: 58  Temp: 98.2 F (36.8 C)  Resp: 8    Physical Exam: Incisions:  Left neck incision with steri-strips. Dried blood on strips. No hematoma.  Extremities:  Moving all extremities equally. 5/5 strength upper and lower extremities bilaterally.  Tongue midline. Slight voice hoarseness, but patient says unchanged since prior to surgery. No smile asymmetry.   CBC    Component Value Date/Time   WBC 5.6 06/09/2014 0240   WBC 4.4 01/15/2014 0848   RBC 3.93* 06/09/2014 0240   RBC 4.57 01/15/2014 0848   HGB 12.4* 06/09/2014 0240   HCT 37.1* 06/09/2014 0240   PLT 176 06/09/2014 0240   MCV 94.4 06/09/2014 0240   MCH 31.6 06/09/2014 0240   MCH 30.6 01/15/2014 0848   MCHC 33.4 06/09/2014 0240   MCHC 33.7 01/15/2014 0848   RDW 12.8 06/09/2014 0240   RDW 15.1 01/15/2014 0848   LYMPHSABS 1.3 01/15/2014 0848   LYMPHSABS 1.2 09/27/2013 1942   MONOABS 0.9 09/27/2013 1942   EOSABS 0.2 01/15/2014 0848   EOSABS 0.1 09/27/2013 1942   BASOSABS 0.0 01/15/2014 0848   BASOSABS 0.0 09/27/2013 1942    BMET    Component Value Date/Time   NA 137 06/09/2014 0240   NA 140 01/15/2014 0848   K 3.8 06/09/2014 0240   CL 103 06/09/2014 0240   CO2 25 06/09/2014 0240   GLUCOSE 129* 06/09/2014 0240   GLUCOSE 112* 01/15/2014 0848   BUN 11 06/09/2014 0240   BUN 24 01/15/2014 0848   CREATININE 1.06 06/09/2014 0240   CALCIUM 8.1* 06/09/2014 0240   GFRNONAA 69* 06/09/2014 0240   GFRAA 80* 06/09/2014 0240    INR    Component Value Date/Time   INR 1.17 06/01/2014 1155     Intake/Output Summary (Last 24 hours) at 06/09/14 0753 Last data filed at 06/09/14 2409  Gross per 24 hour  Intake 5152.05 ml  Output   2475 ml  Net 2677.05 ml     Assessment:  74 y.o. male is s/p:  left carotid endarterectomy  1 Day Post-Op  Plan: -Great amount of blood pressure fluctuations yesterday intra-operatively. Still requiring dopamine today. On 4 mcg currently. Will wean off today. -Left neck incision without hematoma. -Neuro exam intact. Right arm clumsiness reported prior to coming to hospital yesterday has resolved. Voice sounds hoarse, but patient says is the same as before surgery. -Has voided. -Ambulate.  -Hgb stable. Will restart plavix. -Dispo: if able to wean off dopamine, will discharge home today.     Virgina Jock, PA-C [facility] Office: [phone] Pager: [phone] 06/09/2014 7:53 AM  Still requiring dopamine for blood pressure. I suspect that he will need to stay until tomorrow. Neuro intact and incision looks fine.  Deitra Mayo, MD, [place] [phone] Office: [phone]

## 2014-06-10 NOTE — Discharge Summary (Signed)
[facility] Discharge Summary  Marcus [name] 14-Dec-1940 74 y.o. 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 male who on 06/01/2014 had an episode of right second and third finger numbness and tingling and expressive aphasia. He was admitted for stroke workup. He reports he has returned to his complete normal baseline with no deficits and no speech disturbances. He denies any prior history of similar events. He has had no episodes of amaurosis or prior aphasia or weakness. Has no prior cardiac history or other peripheral vascular occlusive disease history. He does not smoke cigarettes. He is quite active and works 50 hours per week.  Hospital Course:  The patient was admitted to the hospital and taken to the operating room on 06/08/2014 and underwent left carotid endarterectomy. Prior to arrival at the hospital, the patient had an episode of right arm clumsiness which resolved. Intra-operatively, the patient had a great deal of blood pressure fluctuation. The patient tolerated the procedure well and was transported to the PACU in stable condition. In the PACU, he had hypotension in the 70s and 80s without much response to volume and was started on low-dose dopamine.  By POD 1, the patient's neuro status was intact. He still required dopamine for blood pressure support. His incision was healing well without evidence of a hematoma. By POD 2, his blood pressure was stable off of dopamine. He was ambulating and voiding without difficulty and pain well controlled.     Recent Labs  06/09/14 0240  NA 137  K 3.8  CL 103  CO2 25  GLUCOSE 129*  BUN 11  CALCIUM 8.1*    Recent Labs  06/08/14 1515 06/09/14 0240  WBC 7.7 5.6  HGB 12.8* 12.4*  HCT 39.2 37.1*  PLT 180 176   No results for input(s): INR in the last  72 hours.  Discharge Instructions:   The patient is discharged to home with extensive instructions on wound care and progressive ambulation.  They are instructed not to drive or perform any heavy lifting until returning to see the physician in his office.  Discharge Instructions    Call MD for:  redness, tenderness, or signs of infection (pain, swelling, bleeding, redness, odor or green/yellow discharge around incision site)    Complete by:  As directed      Call MD for:  severe or increased pain, loss or decreased feeling  in affected limb(s)    Complete by:  As directed      Call MD for:  temperature >100.5    Complete by:  As directed      Discharge wound care:    Complete by:  As directed   Wash wounds daily with soap and water and pat dry. The strips will eventually fall off. Do not pull them off.     Driving Restrictions    Complete by:  As directed   No driving for 2 weeks     Increase activity slowly    Complete by:  As directed   Walk with assistance use walker or cane as needed     Lifting restrictions    Complete by:  As directed   No lifting for 2 weeks     Resume previous diet    Complete by:  As directed  Discharge Diagnosis:  Left carotid stenosis with history of stroke I63.239  Secondary Diagnosis: Patient Active Problem List   Diagnosis Date Noted  . Carotid artery stenosis, symptomatic 06/08/2014  . Stroke   . 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 Essential hypertension   . Carotid stenosis   . TIA (transient ischemic attack) 06/01/2014  . CKD (chronic kidney disease), stage II 06/01/2014  . Malaise and fatigue 11/08/2013  . Weight gain 11/08/2013  . Ankle swelling 11/08/2013  . Acute bronchitis 10/03/2013  . Elevated alkaline phosphatase level 07/20/2012  . Lumbago   . Hyperlipidemia   . 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 Diabetes mellitus without complication   . Vitamin D deficiency    Past  Medical History  Diagnosis Date  . Unspecified vitamin D deficiency   . Urethral stricture unspecified   . Hypertrophy of prostate with urinary obstruction and other lower urinary tract symptoms (LUTS)   . Type II or unspecified type diabetes mellitus without mention of complication, not stated as uncontrolled   . Anxiety state, unspecified   . 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 Urinary frequency   . Insomnia, unspecified   . Unspecified glaucoma   . 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 Calculus of kidney   . Other and unspecified hyperlipidemia   . Reflux esophagitis   . Lumbago   . Unspecified essential hypertension   . Herpes zoster without mention of complication   . Glaucoma   . Pneumonia     bronchial  . Wears glasses   . TIA (transient ischemic attack)   . Seasonal allergies   . History of hiatal hernia   . Complication of anesthesia     " I woke up in the recovery room with tube in throat and panicked."      Medication List    TAKE these medications        acetaminophen 500 MG tablet  Commonly known as:  TYLENOL  Take 1,000 mg by mouth every 6 (six) hours as needed for mild pain or moderate pain.     aspirin 81 MG tablet  Take 1 tablet (81 mg total) by mouth daily as needed (stroke like symptoms).     atorvastatin 40 MG tablet  Commonly known as:  LIPITOR  Take 1 tablet (40 mg total) by mouth daily at 6 PM.     ciprofloxacin 500 MG tablet  Commonly known as:  CIPRO  Take 1 tablet (500 mg total) by mouth 2 (two) times daily.     clopidogrel 75 MG tablet  Commonly known as:  PLAVIX  Take 1 tablet (75 mg total) by mouth daily.     LORazepam 0.5 MG tablet  Commonly known as:  ATIVAN  Take one tablet by mouth every eight hours as needed for anxiety     nicotine polacrilex 2 MG lozenge  Commonly known as:  COMMIT  Take 2 mg by mouth 3 (three) times daily.     oxyCODONE-acetaminophen 5-325 MG per tablet  Commonly known as:   PERCOCET/ROXICET  Take 1 tablet by mouth every 6 (six) hours as needed for moderate pain.     timolol 0.25 % ophthalmic solution  Commonly known as:  TIMOPTIC  Place 1 drop into both eyes 2 (two) times daily.     VITAMIN D PO  Take 1 tablet by mouth daily at 12 noon.  Percocet #20 No Refill  Disposition: Home  Patient's condition: is Good  Follow up: 1. Dr.  Donnetta Hutching in 2 weeks.   Virgina Jock, PA-C [facility] [phone]  --- For [facility] use --- Instructions: Press F2 to tab through selections.  Delete question if not applicable.   Modified Rankin score at D/C (0-6): 0  IV medication needed for:  1. Hypertension: No 2. Hypotension: Yes  Post-op Complications: No  1. Post-op CVA or TIA: No  2. CN injury: No  3. Myocardial infarction: No  4.  CHF: No  5.  Dysrhythmia (new): No  6. Wound infection: No  7. Reperfusion symptoms: No  8. Return to OR: No   Discharge medications: Statin use:  Yes If No: [ ]  For Medical reasons, [ ]  Non-compliant, [ ]  Not-indicated ASA use:  Yes  If No: [ ]  For Medical reasons, [ ]  Non-compliant, [ ]  Not-indicated Beta blocker use:  No If No: [ ]  For Medical reasons, [ ]  Non-compliant, [x ] Not-indicated ACE-Inhibitor use:  No If No: [ ]  For Medical reasons, [ ]  Non-compliant, [ x] Not-indicated P2Y12 Antagonist use: Yes, [x ] Plavix, [ ]  Plasugrel, [ ]  Ticlopinine, [ ]  Ticagrelor, [ ]  Other, [ ]  No for medical reason, [ ]  Non-compliant, [ ]  Not-indicated Anti-coagulant use:  No, [ ]  Warfarin, [ ]  Rivaroxaban, [ ]  Dabigatran, [ ]  Other, [ ]  No for medical reason, [ ]  Non-compliant, [x]  Not-indicated

## 2014-06-10 NOTE — Progress Notes (Addendum)
  [facility] Progress Note  06/10/2014 7:19 AM 2 Days Post-Op  Subjective:  Feeling well. Ready to go home.    Filed Vitals:   06/10/14 0434  BP: 106/58  Pulse: 59  Temp: 97.7 F (36.5 C)  Resp: 15    Physical Exam: Incisions:  No hematoma.  Tongue midline. Smile symmetric. 5/5 strength upper and lower extremities bilaterally.   CBC    Component Value Date/Time   WBC 5.6 06/09/2014 0240   WBC 4.4 01/15/2014 0848   RBC 3.93* 06/09/2014 0240   RBC 4.57 01/15/2014 0848   HGB 12.4* 06/09/2014 0240   HCT 37.1* 06/09/2014 0240   PLT 176 06/09/2014 0240   MCV 94.4 06/09/2014 0240   MCH 31.6 06/09/2014 0240   MCH 30.6 01/15/2014 0848   MCHC 33.4 06/09/2014 0240   MCHC 33.7 01/15/2014 0848   RDW 12.8 06/09/2014 0240   RDW 15.1 01/15/2014 0848   LYMPHSABS 1.3 01/15/2014 0848   LYMPHSABS 1.2 09/27/2013 1942   MONOABS 0.9 09/27/2013 1942   EOSABS 0.2 01/15/2014 0848   EOSABS 0.1 09/27/2013 1942   BASOSABS 0.0 01/15/2014 0848   BASOSABS 0.0 09/27/2013 1942    BMET    Component Value Date/Time   NA 137 06/09/2014 0240   NA 140 01/15/2014 0848   K 3.8 06/09/2014 0240   CL 103 06/09/2014 0240   CO2 25 06/09/2014 0240   GLUCOSE 129* 06/09/2014 0240   GLUCOSE 112* 01/15/2014 0848   BUN 11 06/09/2014 0240   BUN 24 01/15/2014 0848   CREATININE 1.06 06/09/2014 0240   CALCIUM 8.1* 06/09/2014 0240   GFRNONAA 69* 06/09/2014 0240   GFRAA 80* 06/09/2014 0240    INR    Component Value Date/Time   INR 1.17 06/01/2014 1155     Intake/Output Summary (Last 24 hours) at 06/10/14 0719 Last data filed at 06/10/14 0435  Gross per 24 hour  Intake    5.1 ml  Output   3175 ml  Net -3169.9 ml     Assessment:  74 y.o. male is s/p: left carotid endartectomy   2 Days Post-Op  Plan: -Blood pressure stable off dopamine.  -Neuro exam intact. -Incision without hematoma. -Ambulating well. -Discharge home. Follow up in two weeks with Dr. Donnetta Hutching.     Virgina Jock, PA-C [facility] Office: [phone] Pager: [phone] 06/10/2014 7:19 AM  Agree with plans for discharge today.  Deitra Mayo, MD, [place] [phone] Office: [phone]

## 2014-06-10 NOTE — Progress Notes (Signed)
Discharge instructions given to wife and patient with teach-back.  All questions answered.  IV's dc'd intact.  Patient without complaints. Discharged home with wife.

## 2014-06-12 ENCOUNTER — Encounter (HOSPITAL_COMMUNITY): Payer: Self-pay | Admitting: Vascular Surgery

## 2014-06-12 ENCOUNTER — Telehealth: Payer: Self-pay | Admitting: Vascular Surgery

## 2014-06-12 NOTE — Telephone Encounter (Addendum)
-----   Message from Denman George, RN sent at 06/11/2014 12:11 PM EDT ----- Regarding: Zigmund Daniel log; also needs 2 wk. f/u appt. with [facility]   ----- Message -----    From: Alvia Grove, PA-C    Sent: 06/09/2014  10:37 AM      To: [facility] Charge Pool  S/p left carotid endartectomy 06/08/14  F/u with Dr. Donnetta Hutching in 2 weeks  Thanks, Maudie Mercury  06/12/14: spoke with [pt, dpm

## 2014-06-13 ENCOUNTER — Encounter: Payer: Self-pay | Admitting: Internal Medicine

## 2014-06-13 ENCOUNTER — Ambulatory Visit (INDEPENDENT_AMBULATORY_CARE_PROVIDER_SITE_OTHER): Payer: Medicare Other | Admitting: Internal Medicine

## 2014-06-13 VITALS — BP 136/84 | HR 60 | Temp 98.1°F | Ht 67.5 in | Wt 203.4 lb

## 2014-06-13 DIAGNOSIS — E119 Type 2 diabetes mellitus without complications: Secondary | ICD-10-CM

## 2014-06-13 DIAGNOSIS — E785 Hyperlipidemia, unspecified: Secondary | ICD-10-CM | POA: Diagnosis not present

## 2014-06-13 DIAGNOSIS — E559 Vitamin D deficiency, unspecified: Secondary | ICD-10-CM

## 2014-06-13 DIAGNOSIS — I10 Essential (primary) hypertension: Secondary | ICD-10-CM

## 2014-06-13 DIAGNOSIS — I6522 Occlusion and stenosis of left carotid artery: Secondary | ICD-10-CM | POA: Diagnosis not present

## 2014-06-13 NOTE — Progress Notes (Signed)
Patient ID: Marcus [name], male   DOB: 1940/10/16, 74 y.o.   MRN: [id number]    Facility  [facility]    Place of Service:   OFFICE    Allergies  Allergen Reactions  . Codeine Other (See Comments)    constipation  . Demerol [Meperidine] Other (See Comments)    Drops blood pressure     Chief Complaint  Patient presents with  . Medical Management of Chronic Issues    Follow up after surgery for Left Carotid Artery Endarterectomy.    HPI:  Hospitalized for 116 through 06/03/2014. Primary diagnosis was TIA. Evaluation showed plaque to a significant degree in the left carotid artery. It was stabilized and sent home to return again for carotid endarterectomy of the left carotid artery.  Rehospitalized 06/08/14 through 06/10/2014 by Dr. Donavan Burnet for left carotid stenosis with TIA. Underwent left carotid endarterectomy. Patient had to stay the next day in the hospital than anticipated on admission due to problems getting his blood pressure up. He ultimately was discharged 06/10/2014.  Patient comes to the office today for transitioning care visit.  He has done well since return home. There is some residual tenderness along the left neck which is to be expected over 3 days following his discharge. There is no dizziness, visual disturbance, palpitations, chest discomfort, nausea, or fevers.  Blood pressure is under control at 136/84.  He is taking both aspirin and Plavix at this point.  He was started on atorvastatin 40 mg daily.  Medications: Patient's Medications  New Prescriptions   No medications on file  Previous Medications   ACETAMINOPHEN (TYLENOL) 500 MG TABLET    Take 1,000 mg by mouth every 6 (six) hours as needed for mild pain or moderate pain.   ASPIRIN 81 MG TABLET    Take 1 tablet (81 mg total) by mouth daily as needed (stroke like symptoms).   ATORVASTATIN (LIPITOR) 40 MG TABLET    Take 1 tablet (40 mg total) by mouth daily at 6 PM.   CHOLECALCIFEROL (VITAMIN D PO)     Take 1 tablet by mouth daily at 12 noon.   CLOPIDOGREL (PLAVIX) 75 MG TABLET    Take 1 tablet (75 mg total) by mouth daily.   LORAZEPAM (ATIVAN) 0.5 MG TABLET    Take one tablet by mouth every eight hours as needed for anxiety   NICOTINE POLACRILEX (COMMIT) 2 MG LOZENGE    Take 2 mg by mouth 3 (three) times daily.    TIMOLOL (TIMOPTIC) 0.25 % OPHTHALMIC SOLUTION    Place 1 drop into both eyes 2 (two) times daily.  Modified Medications   No medications on file  Discontinued Medications   CIPROFLOXACIN (CIPRO) 500 MG TABLET    Take 1 tablet (500 mg total) by mouth 2 (two) times daily.   OXYCODONE-ACETAMINOPHEN (PERCOCET/ROXICET) 5-325 MG PER TABLET    Take 1 tablet by mouth every 6 (six) hours as needed for moderate pain.     Review of Systems  Constitutional: Negative for fever, activity change, appetite change, fatigue and unexpected weight change.  HENT: Positive for congestion, ear pain, rhinorrhea, sinus pressure and sore throat.   Eyes:       Hx glaucoma  Respiratory: Positive for shortness of breath and wheezing.   Cardiovascular: Negative.  Negative for chest pain, palpitations and leg swelling.  Gastrointestinal: Positive for constipation. Negative for abdominal pain, diarrhea and abdominal distention.  Endocrine: Negative for polydipsia, polyphagia and polyuria.  History of mild elevations in glucose  Genitourinary: Positive for frequency.  Musculoskeletal: Positive for back pain.  Skin: Negative.   Allergic/Immunologic: Negative.   Neurological:       TIA 06/01/2014. Left carotid endarterectomy 06/08/14.  Hematological: Negative.   Psychiatric/Behavioral: Negative.     Filed Vitals:   06/13/14 1441  BP: 136/84  Pulse: 60  Temp: 98.1 F (36.7 C)  TempSrc: Oral  Height: 5' 7.5" (1.715 m)  Weight: 203 lb 6.4 oz (92.262 kg)   Body mass index is 31.37 kg/(m^2).  Physical Exam  Constitutional: He is oriented to person, place, and time. He appears well-developed  and well-nourished. No distress.  HENT:  Head: Atraumatic.  Right Ear: External ear normal.  Left Ear: External ear normal.  Nose: Nose normal.  Mouth/Throat: No oropharyngeal exudate.  Eyes: Conjunctivae and EOM are normal. Pupils are equal, round, and reactive to light.  Neck: No JVD present. No tracheal deviation present. No thyromegaly present.  Surgical incision from left endarterectomy is healing. No inflammation. Mild tenderness. No drainage.  Cardiovascular: Normal rate, regular rhythm, normal heart sounds and intact distal pulses.  Exam reveals no gallop and no friction rub.   No murmur heard. Pulmonary/Chest: No respiratory distress. He has no wheezes. He has no rales.  Upper airway, tracheobronchial, wheeze on forced expiration.  Abdominal: He exhibits no distension and no mass. There is no tenderness.  Musculoskeletal: Normal range of motion. He exhibits no edema or tenderness.  Lower back ain. Cannot remain in one position long. Has to move. Mildly tender to percussion in the lower back. Walks bent forward.  Lymphadenopathy:    He has no cervical adenopathy.  Neurological: He is alert and oriented to person, place, and time. He displays abnormal reflex. No cranial nerve deficit. Coordination normal.  Absent patellar deep tendon reflex bilaterally.  Skin: No rash noted. No erythema. No pallor.  Psychiatric: He has a normal mood and affect. His behavior is normal. Judgment and thought content normal.     Labs reviewed: Admission on 06/08/2014, Discharged on 06/10/2014  Component Date Value Ref Range Status  . Glucose-Capillary 06/08/2014 98  70 - 99 mg/dL Final  . Glucose-Capillary 06/08/2014 100* 70 - 99 mg/dL Final  . WBC 06/08/2014 7.7  4.0 - 10.5 K/uL Final  . RBC 06/08/2014 4.11* 4.22 - 5.81 MIL/uL Final  . Hemoglobin 06/08/2014 12.8* 13.0 - 17.0 g/dL Final  . HCT 06/08/2014 39.2  39.0 - 52.0 % Final  . MCV 06/08/2014 95.4  78.0 - 100.0 fL Final  . MCH 06/08/2014  31.1  26.0 - 34.0 pg Final  . MCHC 06/08/2014 32.7  30.0 - 36.0 g/dL Final  . RDW 06/08/2014 13.1  11.5 - 15.5 % Final  . Platelets 06/08/2014 180  150 - 400 K/uL Final  . Creatinine, Ser 06/08/2014 1.09  0.50 - 1.35 mg/dL Final  . GFR calc non Af Amer 06/08/2014 66* >90 mL/min Final  . GFR calc Af Amer 06/08/2014 77* >90 mL/min Final   Comment: (NOTE) The eGFR has been calculated using the CKD EPI equation. This calculation has not been validated in all clinical situations. eGFR's persistently <90 mL/min signify possible Chronic Kidney Disease.   . WBC 06/09/2014 5.6  4.0 - 10.5 K/uL Final  . RBC 06/09/2014 3.93* 4.22 - 5.81 MIL/uL Final  . Hemoglobin 06/09/2014 12.4* 13.0 - 17.0 g/dL Final  . HCT 06/09/2014 37.1* 39.0 - 52.0 % Final  . MCV 06/09/2014 94.4  78.0 - 100.0  fL Final  . MCH 06/09/2014 31.6  26.0 - 34.0 pg Final  . MCHC 06/09/2014 33.4  30.0 - 36.0 g/dL Final  . RDW 06/09/2014 12.8  11.5 - 15.5 % Final  . Platelets 06/09/2014 176  150 - 400 K/uL Final  . Sodium 06/09/2014 137  135 - 145 mmol/L Final  . Potassium 06/09/2014 3.8  3.5 - 5.1 mmol/L Final  . Chloride 06/09/2014 103  96 - 112 mmol/L Final  . CO2 06/09/2014 25  19 - 32 mmol/L Final  . Glucose, Bld 06/09/2014 129* 70 - 99 mg/dL Final  . BUN 06/09/2014 11  6 - 23 mg/dL Final  . Creatinine, Ser 06/09/2014 1.06  0.50 - 1.35 mg/dL Final  . Calcium 06/09/2014 8.1* 8.4 - 10.5 mg/dL Final  . GFR calc non Af Amer 06/09/2014 69* >90 mL/min Final  . GFR calc Af Amer 06/09/2014 80* >90 mL/min Final   Comment: (NOTE) The eGFR has been calculated using the CKD EPI equation. This calculation has not been validated in all clinical situations. eGFR's persistently <90 mL/min signify possible Chronic Kidney Disease.   Georgiann Hahn gap 06/09/2014 9  5 - 15 Final  Hospital Outpatient Visit on 06/06/2014  Component Date Value Ref Range Status  . MRSA, PCR 06/06/2014 NEGATIVE  NEGATIVE Final  . Staphylococcus aureus 06/06/2014  NEGATIVE  NEGATIVE Final   Comment:        The Xpert SA Assay (FDA approved for NASAL specimens in patients over 67 years of age), is one component of a comprehensive surveillance program.  Test performance has been validated by [facility] for patients greater than or equal to 36 year old. It is not intended to diagnose infection nor to guide or monitor treatment.   Marland Kitchen aPTT 06/06/2014 32  24 - 37 seconds Final  . ABO/RH(D) 06/06/2014 O NEG   Final  . Antibody Screen 06/06/2014 NEG   Final  . Sample Expiration 06/06/2014 06/20/2014   Final  . Color, Urine 06/06/2014 YELLOW  YELLOW Final  . APPearance 06/06/2014 CLOUDY* CLEAR Final  . Specific Gravity, Urine 06/06/2014 1.023  1.005 - 1.030 Final  . pH 06/06/2014 5.5  5.0 - 8.0 Final  . Glucose, UA 06/06/2014 NEGATIVE  NEGATIVE mg/dL Final  . Hgb urine dipstick 06/06/2014 SMALL* NEGATIVE Final  . Bilirubin Urine 06/06/2014 NEGATIVE  NEGATIVE Final  . Ketones, ur 06/06/2014 NEGATIVE  NEGATIVE mg/dL Final  . Protein, ur 06/06/2014 NEGATIVE  NEGATIVE mg/dL Final  . Urobilinogen, UA 06/06/2014 0.2  0.0 - 1.0 mg/dL Final  . Nitrite 06/06/2014 POSITIVE* NEGATIVE Final  . Leukocytes, UA 06/06/2014 LARGE* NEGATIVE Final  . Sodium 06/06/2014 140  135 - 145 mmol/L Final  . Potassium 06/06/2014 4.3  3.5 - 5.1 mmol/L Final  . Chloride 06/06/2014 102  96 - 112 mmol/L Final  . BUN 06/06/2014 20  6 - 23 mg/dL Final  . Creatinine, Ser 06/06/2014 1.20  0.50 - 1.35 mg/dL Final  . Glucose, Bld 06/06/2014 99  70 - 99 mg/dL Final  . Calcium, Ion 06/06/2014 1.15  1.13 - 1.30 mmol/L Final  . TCO2 06/06/2014 24  0 - 100 mmol/L Final  . Hemoglobin 06/06/2014 16.7  13.0 - 17.0 g/dL Final  . HCT 06/06/2014 49.0  39.0 - 52.0 % Final  . Squamous Epithelial / LPF 06/06/2014 FEW* RARE Final  . WBC, UA 06/06/2014 TOO NUMEROUS TO COUNT  <3 WBC/hpf Final  . RBC / HPF 06/06/2014 0-2  <3 RBC/hpf Final  .  Bacteria, UA 06/06/2014 FEW* RARE Final  . Urine-Other  06/06/2014 MUCOUS PRESENT   Final  . ABO/RH(D) 06/06/2014 O NEG   Final  Admission on 06/01/2014, Discharged on 06/03/2014  Component Date Value Ref Range Status  . Opiates 06/01/2014 NONE DETECTED  NONE DETECTED Final  . Cocaine 06/01/2014 NONE DETECTED  NONE DETECTED Final  . Benzodiazepines 06/01/2014 NONE DETECTED  NONE DETECTED Final  . Amphetamines 06/01/2014 NONE DETECTED  NONE DETECTED Final  . Tetrahydrocannabinol 06/01/2014 NONE DETECTED  NONE DETECTED Final  . Barbiturates 06/01/2014 NONE DETECTED  NONE DETECTED Final   Comment:        DRUG SCREEN FOR MEDICAL PURPOSES ONLY.  IF CONFIRMATION IS NEEDED FOR ANY PURPOSE, NOTIFY LAB WITHIN 5 DAYS.        LOWEST DETECTABLE LIMITS FOR URINE DRUG SCREEN Drug Class       Cutoff (ng/mL) Amphetamine      1000 Barbiturate      200 Benzodiazepine   932 Tricyclics       355 Opiates          300 Cocaine          300 THC              50   . WBC 06/01/2014 5.0  4.0 - 10.5 K/uL Final  . RBC 06/01/2014 4.56  4.22 - 5.81 MIL/uL Final  . Hemoglobin 06/01/2014 14.3  13.0 - 17.0 g/dL Final  . HCT 06/01/2014 43.9  39.0 - 52.0 % Final  . MCV 06/01/2014 96.3  78.0 - 100.0 fL Final  . MCH 06/01/2014 31.4  26.0 - 34.0 pg Final  . MCHC 06/01/2014 32.6  30.0 - 36.0 g/dL Final  . RDW 06/01/2014 13.1  11.5 - 15.5 % Final  . Platelets 06/01/2014 177  150 - 400 K/uL Final  . Sodium 06/01/2014 139  135 - 145 mmol/L Final  . Potassium 06/01/2014 4.2  3.5 - 5.1 mmol/L Final  . Chloride 06/01/2014 104  96 - 112 mmol/L Final  . CO2 06/01/2014 27  19 - 32 mmol/L Final  . Glucose, Bld 06/01/2014 92  70 - 99 mg/dL Final  . BUN 06/01/2014 15  6 - 23 mg/dL Final  . Creatinine, Ser 06/01/2014 1.14  0.50 - 1.35 mg/dL Final  . Calcium 06/01/2014 8.9  8.4 - 10.5 mg/dL Final  . Total Protein 06/01/2014 7.0  6.0 - 8.3 g/dL Final  . Albumin 06/01/2014 3.5  3.5 - 5.2 g/dL Final  . AST 06/01/2014 24  0 - 37 U/L Final  . ALT 06/01/2014 19  0 - 53 U/L Final  .  Alkaline Phosphatase 06/01/2014 121* 39 - 117 U/L Final  . Total Bilirubin 06/01/2014 0.5  0.3 - 1.2 mg/dL Final  . GFR calc non Af Amer 06/01/2014 63* >90 mL/min Final  . GFR calc Af Amer 06/01/2014 73* >90 mL/min Final   Comment: (NOTE) The eGFR has been calculated using the CKD EPI equation. This calculation has not been validated in all clinical situations. eGFR's persistently <90 mL/min signify possible Chronic Kidney Disease.   . Anion gap 06/01/2014 8  5 - 15 Final  . Troponin i, poc 06/01/2014 0.01  0.00 - 0.08 ng/mL Final  . Comment 3 06/01/2014          Final   Comment: Due to the release kinetics of cTnI, a negative result within the first hours of the onset of symptoms does not rule out myocardial infarction with certainty.  If myocardial infarction is still suspected, repeat the test at appropriate intervals.   . Prothrombin Time 06/01/2014 15.0  11.6 - 15.2 seconds Final  . INR 06/01/2014 1.17  0.00 - 1.49 Final  . aPTT 06/01/2014 46* 24 - 37 seconds Final   Comment:        IF BASELINE aPTT IS ELEVATED, SUGGEST PATIENT RISK ASSESSMENT BE USED TO DETERMINE APPROPRIATE ANTICOAGULANT THERAPY.   . Hgb A1c MFr Bld 06/01/2014 5.8* 4.8 - 5.6 % Final   Comment: (NOTE)         Pre-diabetes: 5.7 - 6.4         Diabetes: >6.4         Glycemic control for adults with diabetes: <7.0   . Mean Plasma Glucose 06/01/2014 120   Final   Comment: (NOTE) Performed At: [facility] [place], [place] [id number] Lindon Romp MD [id number]   . Opiates 06/02/2014 NONE DETECTED  NONE DETECTED Final  . Cocaine 06/02/2014 NONE DETECTED  NONE DETECTED Final  . Benzodiazepines 06/02/2014 NONE DETECTED  NONE DETECTED Final  . Amphetamines 06/02/2014 NONE DETECTED  NONE DETECTED Final  . Tetrahydrocannabinol 06/02/2014 NONE DETECTED  NONE DETECTED Final  . Barbiturates 06/02/2014 NONE DETECTED  NONE DETECTED Final   Comment:        DRUG SCREEN FOR MEDICAL  PURPOSES ONLY.  IF CONFIRMATION IS NEEDED FOR ANY PURPOSE, NOTIFY LAB WITHIN 5 DAYS.        LOWEST DETECTABLE LIMITS FOR URINE DRUG SCREEN Drug Class       Cutoff (ng/mL) Amphetamine      1000 Barbiturate      200 Benzodiazepine   031 Tricyclics       594 Opiates          300 Cocaine          300 THC              50   . Cholesterol 06/02/2014 167  0 - 200 mg/dL Final  . Triglycerides 06/02/2014 91  <150 mg/dL Final  . HDL 06/02/2014 23* >39 mg/dL Final  . Total CHOL/HDL Ratio 06/02/2014 7.3   Final  . VLDL 06/02/2014 18  0 - 40 mg/dL Final  . LDL Cholesterol 06/02/2014 126* 0 - 99 mg/dL Final   Comment:        Total Cholesterol/HDL:CHD Risk Coronary Heart Disease Risk Table                     Men   Women  1/2 Average Risk   3.4   3.3  Average Risk       5.0   4.4  2 X Average Risk   9.6   7.1  3 X Average Risk  23.4   11.0        Use the calculated Patient Ratio above and the CHD Risk Table to determine the patient's CHD Risk.        ATP III CLASSIFICATION (LDL):  <100     mg/dL   Optimal  100-129  mg/dL   Near or Above                    Optimal  130-159  mg/dL   Borderline  160-189  mg/dL   High  >190     mg/dL   Very High   . Glucose-Capillary 06/01/2014 78  70 - 99 mg/dL Final  . Glucose-Capillary 06/01/2014 95  70 -  99 mg/dL Final     Assessment/Plan  Carotid stenosis, left: Status post left carotid endarterectomy with routine healing  Diabetes mellitus without complication: Multiple episodes of mild elevation of glucose interspersed with normal glucose values. Hemoglobin A1c mildly elevated in the past. Not currently on medications. Advised patient to lose weight and continue to reduce concentrated sweets and starches in his diet.  Essential hypertension: Controlled  Hyperlipidemia: Controlled and now on atorvastatin. Carotid Doppler study did show 1-39% blockage of the right carotid artery.  Vitamin D deficiency: Take supplements regularly.

## 2014-06-25 ENCOUNTER — Encounter: Payer: Self-pay | Admitting: Vascular Surgery

## 2014-06-26 ENCOUNTER — Ambulatory Visit (INDEPENDENT_AMBULATORY_CARE_PROVIDER_SITE_OTHER): Payer: Self-pay | Admitting: Vascular Surgery

## 2014-06-26 ENCOUNTER — Encounter: Payer: Self-pay | Admitting: Vascular Surgery

## 2014-06-26 VITALS — BP 108/80 | HR 63 | Resp 16 | Ht 67.0 in | Wt 197.0 lb

## 2014-06-26 DIAGNOSIS — I6522 Occlusion and stenosis of left carotid artery: Secondary | ICD-10-CM

## 2014-06-26 DIAGNOSIS — Z9889 Other specified postprocedural states: Secondary | ICD-10-CM

## 2014-06-26 NOTE — Progress Notes (Signed)
    Postoperative Visit   History of Present Illness  Marcus [name] is a 74 y.o. male who presents for postoperative follow-up for: left carotid endarterectomy (Date: 06/08/14).  The patient's neck incision is  healed.  The patient has had no stroke or TIA symptoms.  For VQI Use Only  PRE-ADM LIVING: Home  AMB STATUS: Ambulatory  History   Social History  . Marital Status: Married    Spouse Name: N/A  . Number of Children: N/A  . Years of Education: N/A   Occupational History  . Not on file.   Social History Main Topics  . Smoking status: Former Smoker    Types: Cigarettes  . Smokeless tobacco: Never Used     Comment: " Quit around 1986"  . Alcohol Use: No  . Drug Use: No  . Sexual Activity: Yes    Birth Control/ Protection: None   Other Topics Concern  . Not on file   Social History Narrative    Current Outpatient Prescriptions on File Prior to Visit  Medication Sig Dispense Refill  . acetaminophen (TYLENOL) 500 MG tablet Take 1,000 mg by mouth every 6 (six) hours as needed for mild pain or moderate pain.    Marland Kitchen aspirin 81 MG tablet Take 1 tablet (81 mg total) by mouth daily as needed (stroke like symptoms). (Patient taking differently: Take 81 mg by mouth daily. ) 30 tablet 2  . atorvastatin (LIPITOR) 40 MG tablet Take 1 tablet (40 mg total) by mouth daily at 6 PM. 30 tablet 1  . Cholecalciferol (VITAMIN D PO) Take 1 tablet by mouth daily at 12 noon.    . clopidogrel (PLAVIX) 75 MG tablet Take 1 tablet (75 mg total) by mouth daily. 30 tablet 1  . LORazepam (ATIVAN) 0.5 MG tablet Take one tablet by mouth every eight hours as needed for anxiety 90 tablet 1  . nicotine polacrilex (COMMIT) 2 MG lozenge Take 2 mg by mouth 3 (three) times daily.     . timolol (TIMOPTIC) 0.25 % ophthalmic solution Place 1 drop into both eyes 2 (two) times daily.     No current facility-administered medications on file prior to visit.    Physical Examination  Filed Vitals:   06/26/14  1618  BP: 108/80  Pulse: 63  Resp:     Left Neck: Incision is healed.  Neuro: CN 2-12 are intact. Motor strength is 5/5 bilaterally, sensation is grossly intact, some peri-incisional numbness  Medical Decision Making  Marcus [name] is a 74 y.o. male who presents s/p left carotid endarterectomy for symptomatic left carotid stenosis.   The patient's neck incision is  healing with no stroke symptoms. The patient is currently on an antiplatelet: ASA and plavix. He will follow up in six months with carotid duplex studies.    Virgina Jock, PA-C [facility] Office: [phone]  This patient was seen in conjunction with Dr. Donnetta Hutching    I have examined the patient, reviewed and agree with above. Fortunately the person is returned to his normal neurologic baseline. Recovering well from carotid surgery. We'll see him in 6 months.  EARLY, TODD, MD 06/26/2014 4:49 PM

## 2014-06-27 NOTE — Addendum Note (Signed)
Addended by: Peter Minium K on: 06/27/2014 03:00 PM   Modules accepted: Orders

## 2014-07-16 ENCOUNTER — Other Ambulatory Visit: Payer: Self-pay

## 2014-07-18 ENCOUNTER — Ambulatory Visit: Payer: Self-pay | Admitting: Internal Medicine

## 2014-08-02 ENCOUNTER — Other Ambulatory Visit: Payer: Self-pay | Admitting: *Deleted

## 2014-08-02 DIAGNOSIS — I739 Peripheral vascular disease, unspecified: Secondary | ICD-10-CM

## 2014-08-02 MED ORDER — CLOPIDOGREL BISULFATE 75 MG PO TABS: 75.0000 mg | ORAL_TABLET | Freq: Every day | ORAL | Status: DC

## 2014-08-03 ENCOUNTER — Other Ambulatory Visit: Payer: Self-pay | Admitting: *Deleted

## 2014-08-03 MED ORDER — ATORVASTATIN CALCIUM 40 MG PO TABS: ORAL_TABLET | ORAL | Status: DC

## 2014-08-03 NOTE — Telephone Encounter (Signed)
Patient wife requested refill Faxed to pharmacy.  

## 2014-08-06 ENCOUNTER — Other Ambulatory Visit: Payer: Self-pay | Admitting: Internal Medicine

## 2014-08-20 ENCOUNTER — Ambulatory Visit (INDEPENDENT_AMBULATORY_CARE_PROVIDER_SITE_OTHER): Payer: Medicare Other | Admitting: Internal Medicine

## 2014-08-20 ENCOUNTER — Encounter: Payer: Self-pay | Admitting: Internal Medicine

## 2014-08-20 VITALS — BP 110/82 | HR 61 | Temp 98.0°F | Resp 18 | Ht 67.0 in | Wt 194.8 lb

## 2014-08-20 DIAGNOSIS — B0230 Zoster ocular disease, unspecified: Secondary | ICD-10-CM

## 2014-08-20 MED ORDER — GABAPENTIN 100 MG PO CAPS: 100.0000 mg | ORAL_CAPSULE | Freq: Three times a day (TID) | ORAL | Status: DC

## 2014-08-20 MED ORDER — VALACYCLOVIR HCL 1 G PO TABS: 1000.0000 mg | ORAL_TABLET | Freq: Three times a day (TID) | ORAL | Status: DC

## 2014-08-20 NOTE — Progress Notes (Signed)
Patient ID: Marcus [name], male   DOB: 1940-05-29, 74 y.o.   MRN: [id number]   Location:  [facility]  Chief Complaint  Patient presents with  . Acute Visit    rash on face noticed some  bumps on middle of forhead and around side of his head it is painful     HPI: Patient is a 74 y.o.  seen in the office today for acute visit due to rash on face for past 2 days.  Rash on left side of face over his eye.  It began Saturday, spread up on his forehead, painful to touch on his scalp now.  Eye is painful, hurts to blink, is red.  Worse vision in last two days.  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  Had shingles in the past.  Has not had zostavax--could get in 6 months.  Review of Systems:  Review of Systems  Constitutional: Negative for fever and chills.  Eyes: Positive for blurred vision, pain, discharge and redness.  Cardiovascular:       Had recent left carotid surgery  Skin: Positive for itching and rash.       painful  Psychiatric/Behavioral:       No confusion    Past Medical History  Diagnosis Date  . Unspecified vitamin D deficiency   . Urethral stricture unspecified   . Hypertrophy of prostate with urinary obstruction and other lower urinary tract symptoms (LUTS)   . Type II or unspecified type diabetes mellitus without mention of complication, not stated as uncontrolled   . Anxiety state, unspecified   . 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 Urinary frequency   . Insomnia, unspecified   . Unspecified glaucoma   . 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 Calculus of kidney   . Other and unspecified hyperlipidemia   . Reflux esophagitis   . Lumbago   . Unspecified essential hypertension   . Herpes zoster without mention of complication   . Glaucoma   . Pneumonia     bronchial  . Wears glasses   . TIA (transient ischemic attack)   . Seasonal allergies   . History of hiatal hernia   . Complication of anesthesia    " I woke up in the recovery room with tube in throat and panicked."    Past Surgical History  Procedure Laterality Date  . Spinal fusion  12/91,11/91    obtained bone from left lower leg  . Eye surgery  2014    cataract  . Eye surgery  2013    Retina  . Cardiac catheterization  1986    normal, Dr Lia Foyer  . Stress thallium  1994     normal;  . Stretching bladder  07/2007    Dr Risa Grill  . Endarterectomy Left 06/08/2014    Procedure: LEFT CAROTID ARTERY ENDARTERECTOMY;  Surgeon: Rosetta Posner, MD;  Location: [facility];  Service: Vascular;  Laterality: Left;  . Patch angioplasty Left 06/08/2014    Procedure: WITH DACRON PATCH ANGIOPLASTY;  Surgeon: Rosetta Posner, MD;  Location: [place];  Service: Vascular;  Laterality: Left;    Allergies  Allergen Reactions  . Codeine Other (See Comments)    constipation  . Demerol [Meperidine] Other (See Comments)    Drops blood pressure    Medications: Patient's Medications  New Prescriptions   No medications on file  Previous Medications   ACETAMINOPHEN (TYLENOL) 500 MG TABLET  Take 1,000 mg by mouth every 6 (six) hours as needed for mild pain or moderate pain.   ASPIRIN 81 MG TABLET    Take 1 tablet (81 mg total) by mouth daily as needed (stroke like symptoms).   ATORVASTATIN (LIPITOR) 40 MG TABLET    Take one tablet by mouth once daily for cholesterol   CHOLECALCIFEROL (VITAMIN D PO)    Take 1 tablet by mouth daily at 12 noon.   CLOPIDOGREL (PLAVIX) 75 MG TABLET    TAKE ONE TABLET BY MOUTH ONE TIME DAILY   LORAZEPAM (ATIVAN) 0.5 MG TABLET    Take one tablet by mouth every eight hours as needed for anxiety   NICOTINE POLACRILEX (COMMIT) 2 MG LOZENGE    Take 2 mg by mouth 3 (three) times daily.    TIMOLOL (TIMOPTIC) 0.25 % OPHTHALMIC SOLUTION    Place 1 drop into both eyes 2 (two) times daily.  Modified Medications   No medications on file  Discontinued Medications   No medications on file    Physical Exam: Filed Vitals:   08/20/14 1209  BP:  110/82  Pulse: 61  Temp: 98 F (36.7 C)  TempSrc: Oral  Resp: 18  Height: 5\' 7"  (1.702 m)  Weight: 194 lb 12.8 oz (88.361 kg)  SpO2: 97%   Physical Exam  Constitutional: He appears well-developed and well-nourished. He appears distressed.  HENT:  Head: Normocephalic and atraumatic.  Right Ear: External ear normal.  Left Ear: External ear normal.  Nose: Nose normal.  Eyes: EOM are normal. Pupils are equal, round, and reactive to light.  Erythematous left eye with some drainage, blinking due to pain  Skin: Rash noted.  Draining cluster of vesicles just to left of midline on forehead; tenderness over entire left scalp and cheek just beneath eye  Psychiatric: He has a normal mood and affect.    Labs reviewed: Basic Metabolic Panel:  Recent Labs  11/08/13 1144 01/15/14 0848 06/01/14 1155 06/06/14 1324 06/08/14 1515 06/09/14 0240  NA 140 140 139 140  --  137  K 4.3 4.8 4.2 4.3  --  3.8  CL 101 102 104 102  --  103  CO2 23 26 27   --   --  25  GLUCOSE 104* 112* 92 99  --  129*  BUN 20 24 15 20   --  11  CREATININE 1.35* 1.33* 1.14 1.20 1.09 1.06  CALCIUM 8.2* 9.0 8.9  --   --  8.1*  TSH 1.150 1.620  --   --   --   --    Liver Function Tests:  Recent Labs  09/27/13 1942 11/08/13 1144 01/15/14 0848 06/01/14 1155  AST 20 22 20 24   ALT 18 21 16 19   ALKPHOS 159* 183* 132* 121*  BILITOT 0.4 0.4 <0.2 0.5  PROT 7.2 6.0 6.8 7.0  ALBUMIN 2.9*  --   --  3.5   No results for input(s): LIPASE, AMYLASE in the last 8760 hours. No results for input(s): AMMONIA in the last 8760 hours. CBC:  Recent Labs  09/27/13 1942  11/08/13 1144 01/15/14 0848 06/01/14 1155 06/06/14 1324 06/08/14 1515 06/09/14 0240  WBC 7.1  < > 6.5 4.4 5.0  --  7.7 5.6  NEUTROABS 4.9  --  4.4 2.2  --   --   --   --   HGB 13.7  --  11.7* 14.0 14.3 16.7 12.8* 12.4*  HCT 42.5  --  35.8* 41.6 43.9 49.0 39.2 37.1*  MCV 97.9  --  95 91 96.3  --  95.4 94.4  PLT 243  --   --   --  177  --  180 176  < >  = values in this interval not displayed. Lipid Panel:  Recent Labs  01/15/14 0848 06/02/14 0544  CHOL 173 167  HDL 30* 23*  LDLCALC 127* 126*  TRIG 80 91  CHOLHDL 5.8* 7.3   Lab Results  Component Value Date   HGBA1C 5.8* 06/01/2014    Assessment/Plan 1. Herpes zoster ophthalmicus -treat with valtrex for infection, neurontin for pain -had syncope/?near arrest with codeine use previously so I will not give him tramadol which has cross-reactivity -I sent him straight over to Dr. Talbert Forest' office for an ophthalmologic emergency assessment--he already has glaucoma and declining vision in the left eye - valACYclovir (VALTREX) 1000 MG tablet; Take 1 tablet (1,000 mg total) by mouth 3 (three) times daily.  Dispense: 21 tablet; Refill: 0 - gabapentin (NEURONTIN) 100 MG capsule; Take 1 capsule (100 mg total) by mouth 3 (three) times daily.  Dispense: 90 capsule; Refill: 1 -advised to avoid contact with pregnant women and immunocompromised -given work excuse for 8 days due to severe pain and contagious nature -also advised that his wife should get the vaccine here (she's not quite 65 yet) and he should get it in 6 mos.  Labs/tests ordered:  No new Next appt:  Keep regular visit with Dr. Nyoka Cowden and return here prn re: the shingles  Donella Pascarella L. Arby Dahir, D.O. [facility] [address], [place] [address] Cell Phone (Mon-Fri 8am-5pm):  [phone] On Call:  [phone] & follow prompts after 5pm & weekends Office Phone:  [phone] Office Fax:  [phone]

## 2014-08-27 ENCOUNTER — Other Ambulatory Visit: Payer: Self-pay | Admitting: Nurse Practitioner

## 2014-10-03 ENCOUNTER — Encounter: Payer: Self-pay | Admitting: Internal Medicine

## 2014-10-08 ENCOUNTER — Other Ambulatory Visit: Payer: Self-pay | Admitting: Internal Medicine

## 2014-10-10 ENCOUNTER — Encounter: Payer: Self-pay | Admitting: Neurology

## 2014-10-10 ENCOUNTER — Ambulatory Visit (INDEPENDENT_AMBULATORY_CARE_PROVIDER_SITE_OTHER): Payer: Medicare Other | Admitting: Neurology

## 2014-10-10 VITALS — BP 106/72 | HR 61 | Ht 67.0 in | Wt 200.0 lb

## 2014-10-10 DIAGNOSIS — E785 Hyperlipidemia, unspecified: Secondary | ICD-10-CM

## 2014-10-10 DIAGNOSIS — I6522 Occlusion and stenosis of left carotid artery: Secondary | ICD-10-CM

## 2014-10-10 DIAGNOSIS — E1159 Type 2 diabetes mellitus with other circulatory complications: Secondary | ICD-10-CM | POA: Diagnosis not present

## 2014-10-10 DIAGNOSIS — I10 Essential (primary) hypertension: Secondary | ICD-10-CM | POA: Diagnosis not present

## 2014-10-10 DIAGNOSIS — I639 Cerebral infarction, unspecified: Secondary | ICD-10-CM

## 2014-10-10 DIAGNOSIS — E119 Type 2 diabetes mellitus without complications: Secondary | ICD-10-CM | POA: Insufficient documentation

## 2014-10-10 NOTE — Progress Notes (Signed)
STROKE NEUROLOGY FOLLOW UP NOTE  NAME: Marcus [name] DOB: 05/01/1940  REASON FOR VISIT: stroke follow up HISTORY FROM: chart and pt and wife  Today we had the pleasure of seeing Marcus [name] in follow-up at our Neurology Clinic. Pt was accompanied by wife.   History Summary Mr. Marcus [name]. is a 74 y.o. male with history of DM, HTN, HLD, smoker admitted on 06/01/14 for transient aphasia and left hand numbness. MRI showed left MCA territory stroke, embolic pattern. MRA was normal and CTA neck showed left ICA significant plaque formation and stenosis. Other stroke w/u including 2D echo, DVT screening all negateive. LD 126 and A1C 5.8. He has vascular surgery consult and Dr. Donnetta Hutching did left CEA on 06/08/14 successfully.   Interval History During the interval time, the patient has been doing well. Left neck wound healed well. No residue deficit left. Had appointment with PCP in 11/2014 and he will follow up with Dr, Early in 01/2015. Pt still on ASA and plavix and BP 106/72.    REVIEW OF SYSTEMS: Full 14 system review of systems performed and notable only for those listed below and in HPI above, all others are negative:  Constitutional:  fatigue Cardiovascular: swelling in legs Ear/Nose/Throat:  Ringing in ears Skin: itching Eyes:   Respiratory:   Gastroitestinal:   Genitourinary:  Hematology/Lymphatic:   Endocrine:  Musculoskeletal:   Allergy/Immunology:   Neurological:  Difficulty with swallowing  Psychiatric: change in appetite Sleep:   The following represents the patient's updated allergies and side effects list: Allergies  Allergen Reactions  . Codeine Other (See Comments)    constipation  . Demerol [Meperidine] Other (See Comments)    Drops blood pressure     The neurologically relevant items on the patient's problem list were reviewed on today's visit.  Neurologic Examination  A problem focused neurological exam (12 or more points of the single system neurologic  examination, vital signs counts as 1 point, cranial nerves count for 8 points) was performed.  Blood pressure 106/72, pulse 61, height 5\' 7"  (1.702 m), weight 200 lb (90.719 kg).  General - Well nourished, well developed, in no apparent distress.  Ophthalmologic - Sharp disc margins OU. Fundi not visualized due to .  Cardiovascular - Regular rate and rhythm with no murmur.  Mental Status -  Level of arousal and orientation to time, place, and person were intact. Language including expression, naming, repetition, comprehension was assessed and found intact. Attention span and concentration were normal. Recent and remote memory were intact. Fund of Knowledge was assessed and was intact.  Cranial Nerves II - XII - II - Visual field intact OU. III, IV, VI - Extraocular movements intact. V - Facial sensation intact bilaterally. VII - Facial movement intact bilaterally. VIII - Hearing & vestibular intact bilaterally. X - Palate elevates symmetrically. XI - Chin turning & shoulder shrug intact bilaterally. XII - Tongue protrusion intact.  Motor Strength - The patient's strength was normal in all extremities and pronator drift was absent.  Bulk was normal and fasciculations were absent.   Motor Tone - Muscle tone was assessed at the neck and appendages and was normal.  Reflexes - The patient's reflexes were 1+ in all extremities and he had no pathological reflexes.  Sensory - Light touch, temperature/pinprick, vibration and proprioception, and Romberg testing were assessed and were normal.    Coordination - The patient had normal movements in the hands and feet with no ataxia or dysmetria.  Tremor  was absent.  Gait and Station - The patient's transfers, posture, gait, station, and turns were observed as normal.  Data reviewed: I personally reviewed the images and agree with the radiology interpretations.  Ct Head Wo Contrast 06/01/2014  All diffuse atrophy with rather minimal  periventricular small vessel disease. No intracranial mass, hemorrhage, or acute appearing infarct. Minimal right maxillary paranasal sinus disease.    Mri and Mra Head Wo Contrast 06/01/2014  Two 1 cm areas of acute infarction in the left hemisphere consistent with embolic disease in the left middle cerebral artery territory, located at the posterior parietal region and parietal occipital junction region.  No hemorrhage or mass effect.  Normal MR angiography of the large and medium size vessels.   2D echo - - Procedure narrative: Transthoracic echocardiography. Image quality was poor. The study was technically difficult, as a result of poor acoustic windows. - Left ventricle: The cavity size was normal. There was mild concentric hypertrophy. Systolic function was normal. The estimated ejection fraction was in the range of 60% to 65%. Images were inadequate for LV wall motion assessment. Doppler parameters are consistent with abnormal left ventricular relaxation (grade 1 diastolic dysfunction). - Mitral valve: Mildly calcified annulus. Normal thickness leaflets . - Left atrium: The atrium was mildly dilated. - Right atrium: The atrium was mildly dilated.  CUS - 1-39% right ICA stenosis. 40-59% left ICA stenosis, lowest end of scale. Vertebral artery flow is antegrade.  LE venous doppler - Preliminary report: There is no DVT or SVT noted in the bilateral lower extremities.   CTA neck - Motion degraded exam. 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 Numerous serial stenoses, most on the order of 50%. Vessel regains a normal appearance distal to the bulb. Mild atherosclerotic disease at the right carotid bifurcation without stenosis.  Component     Latest Ref Rng 06/01/2014 06/02/2014  Cholesterol     0 - 200 mg/dL  167  Triglycerides     <150 mg/dL  91  HDL  Cholesterol     >39 mg/dL  23 (L)  Total CHOL/HDL Ratio       7.3  VLDL     0 - 40 mg/dL  18  LDL (calc)     0 - 99 mg/dL  126 (H)  Hemoglobin A1C     4.8 - 5.6 % 5.8 (H)   Mean Plasma Glucose      120     Assessment: As you may recall, he is a 74 y.o. Caucasian male with PMH of DM, HTN, HLD, smoker admitted on 06/01/14 for transient aphasia and left hand numbness. MRI showed left MCA territory stroke, embolic pattern. MRA was normal and CTA neck showed left ICA significant plaque formation and stenosis. Other stroke w/u including 2D echo, DVT screening all negateive. LD 126 and A1C 5.8. Etiology most likely from large vessel athero with embolization. He has vascular surgery consult and Dr. Donnetta Hutching did left CEA on 06/08/14 successfully. Pt recovered well. Still on dural antiplatelet and lipitor.  Plan:  - continue plavix and lipitor for stroke prevention - discontinue the baby ASA - Follow up with your primary care physician for stroke risk factor modification. Recommend maintain blood pressure goal <130/80, diabetes with hemoglobin A1c goal below 6.5% and lipids with LDL cholesterol goal below 70 mg/dL.  - continue to follow up with Dr. Donnetta Hutching. - RTC in 6 months.  No  orders of the defined types were placed in this encounter.    Meds ordered this encounter  Medications  . aspirin 81 MG tablet    Sig: Take 81 mg by mouth daily.    Patient Instructions  - continue plavix and lipitor for stroke prevention - discontinue the baby ASA - Follow up with your primary care physician for stroke risk factor modification. Recommend maintain blood pressure goal <130/80, diabetes with hemoglobin A1c goal below 6.5% and lipids with LDL cholesterol goal below 70 mg/dL.  - continue to follow up with Dr. Donnetta Hutching. - follow up in 6 months.    Rosalin Hawking, MD PhD [facility] [address], [place], [place] [address] [phone]

## 2014-10-10 NOTE — Patient Instructions (Signed)
-   continue plavix and lipitor for stroke prevention - discontinue the baby ASA - Follow up with your primary care physician for stroke risk factor modification. Recommend maintain blood pressure goal <130/80, diabetes with hemoglobin A1c goal below 6.5% and lipids with LDL cholesterol goal below 70 mg/dL.  - continue to follow up with Dr. Donnetta Hutching. - follow up in 6 months.

## 2014-10-15 ENCOUNTER — Other Ambulatory Visit: Payer: Medicare Other

## 2014-10-17 ENCOUNTER — Ambulatory Visit: Payer: Medicare Other | Admitting: Internal Medicine

## 2014-10-20 ENCOUNTER — Other Ambulatory Visit: Payer: Self-pay | Admitting: Internal Medicine

## 2014-10-22 ENCOUNTER — Telehealth: Payer: Self-pay | Admitting: *Deleted

## 2014-10-22 NOTE — Telephone Encounter (Signed)
Stop gabapentin. Drowsiness blurred vision or known potential side effects of this drug. It is not an allergy, but isn't intolerance to the drug.

## 2014-10-22 NOTE — Telephone Encounter (Signed)
Patient wife called and stated that patient had shingles and given Gabapentin. The Gabapentin caused him to make him feel drowsy and blurry vision, so he stopped it. He started having the tingle feeling from the shingles again this weekend on his head, so he started taking it again and it caused the same symptoms. Should he stop it until he sees you on 9/7 or continue to take it. Please Advise.

## 2014-10-22 NOTE — Telephone Encounter (Signed)
Patient wife notified and agreed.  

## 2014-11-02 ENCOUNTER — Other Ambulatory Visit: Payer: Medicare Other

## 2014-11-02 DIAGNOSIS — E785 Hyperlipidemia, unspecified: Secondary | ICD-10-CM

## 2014-11-02 DIAGNOSIS — E119 Type 2 diabetes mellitus without complications: Secondary | ICD-10-CM

## 2014-11-03 LAB — COMPREHENSIVE METABOLIC PANEL
A/G RATIO: 1.2 (ref 1.1–2.5)
ALT: 16 IU/L (ref 0–44)
AST: 22 IU/L (ref 0–40)
Albumin: 3.7 g/dL (ref 3.5–4.8)
Alkaline Phosphatase: 145 IU/L — ABNORMAL HIGH (ref 39–117)
BUN/Creatinine Ratio: 19 (ref 10–22)
BUN: 20 mg/dL (ref 8–27)
Bilirubin Total: 0.4 mg/dL (ref 0.0–1.2)
CALCIUM: 9.2 mg/dL (ref 8.6–10.2)
CO2: 26 mmol/L (ref 18–29)
Chloride: 102 mmol/L (ref 97–108)
Creatinine, Ser: 1.08 mg/dL (ref 0.76–1.27)
GFR, EST AFRICAN AMERICAN: 79 mL/min/{1.73_m2} (ref >59)
GFR, EST NON AFRICAN AMERICAN: 69 mL/min/{1.73_m2} (ref >59)
GLOBULIN, TOTAL: 3.1 g/dL (ref 1.5–4.5)
Glucose: 103 mg/dL — ABNORMAL HIGH (ref 65–99)
POTASSIUM: 4.6 mmol/L (ref 3.5–5.2)
SODIUM: 141 mmol/L (ref 134–144)
TOTAL PROTEIN: 6.8 g/dL (ref 6.0–8.5)

## 2014-11-03 LAB — LIPID PANEL
Chol/HDL Ratio: 3.5 ratio units (ref 0.0–5.0)
Cholesterol, Total: 92 mg/dL — ABNORMAL LOW (ref 100–199)
HDL: 26 mg/dL — ABNORMAL LOW (ref >39)
LDL Calculated: 53 mg/dL (ref 0–99)
Triglycerides: 66 mg/dL (ref 0–149)
VLDL Cholesterol Cal: 13 mg/dL (ref 5–40)

## 2014-11-03 LAB — HEMOGLOBIN A1C
ESTIMATED AVERAGE GLUCOSE: 126 mg/dL
HEMOGLOBIN A1C: 6 % — AB (ref 4.8–5.6)

## 2014-11-03 LAB — MICROALBUMIN, URINE: MICROALBUM., U, RANDOM: 60 ug/mL

## 2014-11-07 ENCOUNTER — Encounter: Payer: Self-pay | Admitting: Internal Medicine

## 2014-11-07 ENCOUNTER — Ambulatory Visit: Payer: Medicare Other | Admitting: Internal Medicine

## 2014-11-07 ENCOUNTER — Ambulatory Visit (INDEPENDENT_AMBULATORY_CARE_PROVIDER_SITE_OTHER): Payer: Medicare Other | Admitting: Internal Medicine

## 2014-11-07 VITALS — BP 122/84 | HR 62 | Temp 98.0°F | Ht 67.0 in | Wt 195.4 lb

## 2014-11-07 DIAGNOSIS — I639 Cerebral infarction, unspecified: Secondary | ICD-10-CM

## 2014-11-07 DIAGNOSIS — R5381 Other malaise: Secondary | ICD-10-CM | POA: Diagnosis not present

## 2014-11-07 DIAGNOSIS — I10 Essential (primary) hypertension: Secondary | ICD-10-CM | POA: Diagnosis not present

## 2014-11-07 DIAGNOSIS — M545 Low back pain, unspecified: Secondary | ICD-10-CM

## 2014-11-07 DIAGNOSIS — E785 Hyperlipidemia, unspecified: Secondary | ICD-10-CM

## 2014-11-07 DIAGNOSIS — Z23 Encounter for immunization: Secondary | ICD-10-CM

## 2014-11-07 DIAGNOSIS — R5383 Other fatigue: Secondary | ICD-10-CM | POA: Diagnosis not present

## 2014-11-07 DIAGNOSIS — N182 Chronic kidney disease, stage 2 (mild): Secondary | ICD-10-CM | POA: Diagnosis not present

## 2014-11-07 DIAGNOSIS — I6522 Occlusion and stenosis of left carotid artery: Secondary | ICD-10-CM | POA: Diagnosis not present

## 2014-11-07 DIAGNOSIS — R748 Abnormal levels of other serum enzymes: Secondary | ICD-10-CM | POA: Diagnosis not present

## 2014-11-07 DIAGNOSIS — G252 Other specified forms of tremor: Secondary | ICD-10-CM

## 2014-11-07 DIAGNOSIS — R251 Tremor, unspecified: Secondary | ICD-10-CM

## 2014-11-07 DIAGNOSIS — G250 Essential tremor: Secondary | ICD-10-CM

## 2014-11-07 DIAGNOSIS — E1159 Type 2 diabetes mellitus with other circulatory complications: Secondary | ICD-10-CM | POA: Diagnosis not present

## 2014-11-07 NOTE — Progress Notes (Signed)
Patient ID: Marcus [name], male   DOB: [birth year], 74 y.o.   MRN: [id number]    HISTORY AND PHYSICAL  Location:    [facility]   Place of Service:   Office  Extended Emergency Contact Information Primary Emergency Contact: [facility] Address: [address]          [place], [place] [address] Johnnette Litter of [place] Phone: [phone] Work Phone: [phone] Relation: Spouse Secondary Emergency Contact: Daisy Floro, [place] [address] [place] Phone: [phone] Relation: Daughter  Advanced Directive information   no advanced directive  Chief Complaint  Patient presents with  . Medical Management of Chronic Issues    Medical Management of Chronic Issures. 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 Patient has done well since his surgery and wounds were fully healed.  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 No recurrent events. Now postsurgery and stenosis right carotid artery.  CKD (chronic kidney disease), stage II - resolved. Normal BUN and creatinine.  Elevated alkaline phosphatase level - continues with mild elevation of alkaline phosphatase which is stable. I suspect this is related to fatty liver.  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  Essential hypertension - controlled  HLD (hyperlipidemia) - controlled   Malaise and fatigue - improved since discussed at last visit  Bilateral low back pain without sciatica - chronic condition which interferes with activity level. 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   Need for vaccination with 13-polyvalent pneumococcal conjugate vaccine   Encounter for immunization - flu vaccine    Past Medical History  Diagnosis Date  .  Unspecified vitamin D deficiency   . Urethral stricture unspecified   . Hypertrophy of prostate with urinary obstruction and other lower urinary tract symptoms (LUTS)   . Type II or unspecified type diabetes mellitus without mention of complication, not stated as uncontrolled   . Anxiety state, unspecified   . 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 Urinary frequency   . Insomnia, unspecified   . Unspecified glaucoma   . 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 Calculus of kidney   . Other and unspecified hyperlipidemia   . Reflux esophagitis   . Lumbago   . Unspecified essential hypertension   . Herpes zoster without mention of complication   . Glaucoma   . Pneumonia     bronchial  . Wears glasses   . TIA (transient ischemic attack)   . Seasonal allergies   . History of hiatal hernia   . Complication of anesthesia     " I woke up in the recovery room with tube in throat and panicked."    Past Surgical History  Procedure Laterality Date  . Spinal fusion  12/91,11/91    obtained bone from left lower leg  . Eye surgery  2014    cataract  . Eye surgery  2013    Retina  . Cardiac catheterization  1986    normal, Dr Lia Foyer  . Stress thallium  1994     normal;  . Stretching bladder  07/2007    Dr Risa Grill  . Endarterectomy Left 06/08/2014    Procedure: LEFT CAROTID ARTERY ENDARTERECTOMY;  Surgeon:  Rosetta Posner, MD;  Location: [facility];  Service: Vascular;  Laterality: Left;  . Patch angioplasty Left 06/08/2014    Procedure: WITH DACRON PATCH ANGIOPLASTY;  Surgeon: Rosetta Posner, MD;  Location: [facility];  Service: Vascular;  Laterality: Left;    Patient Care Team: Estill Dooms, MD as PCP - General (Internal Medicine) Rana Snare, MD as Consulting Physician (Urology) Darleen Crocker, MD as Consulting Physician (Ophthalmology)  Social History   Social History  . Marital Status: Married    Spouse Name: N/A  . Number of Children: N/A  . Years of Education: N/A    Occupational History  . Not on file.   Social History Main Topics  . Smoking status: Former Smoker    Types: Cigarettes  . Smokeless tobacco: Never Used     Comment: " Quit around 1986"  . Alcohol Use: No  . Drug Use: No  . Sexual Activity: Yes    Birth Control/ Protection: None   Other Topics Concern  . Not on file   Social History Narrative    reports that he has quit smoking. His smoking use included Cigarettes. He has never used smokeless tobacco. He reports that he does not drink alcohol or use illicit drugs.  Family History  Problem Relation Age of Onset  . Diabetes Mother   . Cancer Mother   . Hypertension Father   . Stroke Father   . Alcohol abuse Brother    Family Status  Relation Status Death Age  . Mother Deceased   . Father Deceased   . Brother Deceased   . Sister Alive   . Sister Alive   . Daughter Alive   . Son Alive   . Daughter Alive   . Daughter Alive     Immunization History  Administered Date(s) Administered  . DTaP 04/30/2009, 10/15/2012  . Influenza,inj,Quad PF,36+ Mos 02/01/2013  . Influenza-Unspecified 01/02/2009, 12/01/2011, 01/17/2014  . Pneumococcal Polysaccharide-23 02/01/2013    Allergies  Allergen Reactions  . Codeine Other (See Comments)    constipation  . Demerol [Meperidine] Other (See Comments)    Drops blood pressure     Medications: Patient's Medications  New Prescriptions   No medications on file  Previous Medications   ACETAMINOPHEN (TYLENOL) 500 MG TABLET    Take 1,000 mg by mouth every 6 (six) hours as needed for mild pain or moderate pain.   ATORVASTATIN (LIPITOR) 40 MG TABLET    Take one tablet by mouth once daily for cholesterol   CHOLECALCIFEROL (VITAMIN D PO)    Take 1 tablet by mouth daily at 12 noon.   CLOPIDOGREL (PLAVIX) 75 MG TABLET    TAKE ONE TABLET BY MOUTH ONE TIME DAILY   LORAZEPAM (ATIVAN) 0.5 MG TABLET    TAKE ONE TABLET BY MOUTH EVERY EIGHT HOURS AS NEEDED   NICOTINE POLACRILEX (COMMIT) 2  MG LOZENGE    Take 2 mg by mouth 3 (three) times daily.    TIMOLOL (TIMOPTIC) 0.25 % OPHTHALMIC SOLUTION    Place 1 drop into both eyes 2 (two) times daily.  Modified Medications   No medications on file  Discontinued Medications   ASPIRIN 81 MG TABLET    Take 81 mg by mouth daily.   GABAPENTIN (NEURONTIN) 100 MG CAPSULE    TAKE 1 CAPSULE BY MOUTH 3 TIMES DAILY.   VALACYCLOVIR (VALTREX) 1000 MG TABLET    Take 1 tablet (1,000 mg total) by mouth 3 (three) times daily.    Review  of Systems  Constitutional: Negative for fever, activity change, appetite change, fatigue and unexpected weight change.  HENT: Negative for congestion, ear pain, hearing loss, rhinorrhea, sinus pressure, sore throat, tinnitus, trouble swallowing and voice change.   Eyes:       Hx glaucoma  Respiratory: Negative for cough, choking, chest tightness, shortness of breath and wheezing.   Cardiovascular: Negative.  Negative for chest pain, palpitations and leg swelling.  Gastrointestinal: Positive for constipation. Negative for nausea, abdominal pain, diarrhea and abdominal distention.  Endocrine: Negative for cold intolerance, heat intolerance, polydipsia, polyphagia and polyuria.       History of mild elevations in glucose  Genitourinary: Positive for frequency. Negative for dysuria, urgency and testicular pain.       Not incontinent  Musculoskeletal: Positive for back pain. Negative for myalgias, arthralgias, gait problem and neck pain.  Skin: Negative.  Negative for color change, pallor and rash.  Allergic/Immunologic: Negative.   Neurological: Negative for dizziness, tremors, syncope, speech difficulty, weakness, numbness and headaches.       TIA 06/01/2014. Left carotid endarterectomy 06/08/14.  Hematological: Negative.  Negative for adenopathy. Does not bruise/bleed easily.  Psychiatric/Behavioral: Negative.  Negative for hallucinations, behavioral problems, confusion, sleep disturbance and decreased concentration.  The patient is not nervous/anxious.     Filed Vitals:   11/07/14 1517  BP: 122/84  Pulse: 62  Temp: 98 F (36.7 C)  TempSrc: Oral  Height: _0  (1.702 m)  Weight: 195 lb 6.4 oz (88.633 kg)   Body mass index is 30.6 kg/(m^2).  Physical Exam  Constitutional: He is oriented to person, place, and time. He appears well-developed and well-nourished. No distress.  HENT:  Head: Atraumatic.  Right Ear: External ear normal.  Left Ear: External ear normal.  Nose: Nose normal.  Mouth/Throat: No oropharyngeal exudate.  Eyes: Conjunctivae and EOM are normal. Pupils are equal, round, and reactive to light.  Neck: No JVD present. No tracheal deviation present. No thyromegaly present.  Surgical incision from left endarterectomy is healed. No bruit.  Cardiovascular: Normal rate, regular rhythm, normal heart sounds and intact distal pulses.  Exam reveals no gallop and no friction rub.   No murmur heard. Pulmonary/Chest: No respiratory distress. He has no wheezes. He has no rales.  Abdominal: He exhibits no distension and no mass. There is no tenderness.  Genitourinary: Rectum normal and prostate normal. Guaiac negative stool.  Musculoskeletal: Normal range of motion. He exhibits no edema or tenderness.  Lower back pain. Cannot remain in one position long. Has to move. Mildly tender to percussion in the lower back. Walks bent forward.  Lymphadenopathy:    He has no cervical adenopathy.  Neurological: He is alert and oriented to person, place, and time. He displays abnormal reflex. No cranial nerve deficit. Coordination normal.  Absent patellar deep tendon reflex bilaterally. Normal sensations to vibration and monofilament testing.  Skin: No rash noted. No erythema. No pallor.  Psychiatric: He has a normal mood and affect. His behavior is normal. Judgment and thought content normal.    Labs reviewed: Lab Summary Latest Ref Rng 11/02/2014 06/09/2014 06/08/2014 06/06/2014  Hemoglobin 13.0 - 17.0 g/dL  (None) 12.4(L) 12.8(L) 16.7  Hematocrit 39.0 - 52.0 % (None) 37.1(L) 39.2 49.0  White count 4.0 - 10.5 K/uL (None) 5.6 7.7 (None)  Platelet count 150 - 400 K/uL (None) 176 180 (None)  Sodium 134 - 144 mmol/L 141 137 (None) 140  Potassium 3.5 - 5.2 mmol/L 4.6 3.8 (None) 4.3  Calcium 8.6 -  10.2 mg/dL 9.2 8.1(L) (None) (None)  Phosphorus - (None) (None) (None) (None)  Creatinine 0.76 - 1.27 mg/dL 1.08 1.06 1.09 1.20  AST 0 - 40 IU/L 22 (None) (None) (None)  Alk Phos 39 - 117 IU/L 145(H) (None) (None) (None)  Bilirubin 0.0 - 1.2 mg/dL 0.4 (None) (None) (None)  Glucose 65 - 99 mg/dL 103(H) 129(H) (None) 99  Cholesterol - (None) (None) (None) (None)  HDL cholesterol >39 mg/dL 26(L) (None) (None) (None)  Triglycerides 0 - 149 mg/dL 66 (None) (None) (None)  LDL Direct - (None) (None) (None) (None)  LDL Calc 0 - 99 mg/dL 53 (None) (None) (None)  Total protein - (None) (None) (None) (None)  Albumin 3.5 - 4.8 g/dL 3.7 (None) (None) (None)   Lab Results  Component Value Date   BUN 20 11/02/2014   Lab Results  Component Value Date   HGBA1C 6.0* 11/02/2014   Lab Results  Component Value Date   TSH 1.620 01/15/2014     Assessment/Plan  1. Carotid stenosis, left Status post endarterectomy 06/08/2014. Fully healed. No bruit.  2. CVA (cerebral vascular accident) No further neurologic events since TIA 06/01/14  3. CKD (chronic kidney disease), stage II Resolved  4. Elevated alkaline phosphatase level Currently 145. Was up to 183 on 11/08/13. Continue to monitor.  5. Essential and other specified forms of tremor Mild and not interfering with ADL  6. Essential hypertension Controlled - Comprehensive metabolic panel; Future  7. HLD (hyperlipidemia) Controlled - Lipid panel; Future  8. Malaise and fatigue Improved  9. Bilateral low back pain without sciatica Unchanged; chronic  10. Type 2 diabetes mellitus with other circulatory complications Controlled - Hemoglobin A1c;  Future - Comprehensive metabolic panel; Future  11. Need for vaccination with 13-polyvalent pneumococcal conjugate vaccine - Pneumococcal conjugate vaccine 13-valent  12. Encounter for immunization Flu vaccine per

## 2014-11-15 ENCOUNTER — Other Ambulatory Visit: Payer: Self-pay | Admitting: Internal Medicine

## 2014-12-20 ENCOUNTER — Other Ambulatory Visit: Payer: Self-pay

## 2014-12-20 ENCOUNTER — Telehealth: Payer: Self-pay

## 2014-12-20 ENCOUNTER — Other Ambulatory Visit: Payer: Medicare Other

## 2014-12-20 DIAGNOSIS — R319 Hematuria, unspecified: Secondary | ICD-10-CM

## 2014-12-20 NOTE — Telephone Encounter (Addendum)
Patients wife called said her husband is having some of the same issues he had before when he had the bladder infection, such as frequent urination and some blood in his clothes.  She said the color is sometime pink and sometimes brown she wants to know if he should be seen as she is going [place] of [place] soon. Please advise

## 2014-12-20 NOTE — Telephone Encounter (Signed)
He should submit a specimen for UA and culture.

## 2014-12-20 NOTE — Telephone Encounter (Signed)
Spoke with wife said she would have her husband come in to give a urine sample made a nurse visit appointment for him

## 2014-12-21 LAB — URINALYSIS
BILIRUBIN UA: NEGATIVE
Glucose, UA: NEGATIVE
Ketones, UA: NEGATIVE
NITRITE UA: NEGATIVE
PH UA: 7.5 (ref 5.0–7.5)
Protein, UA: NEGATIVE
RBC UA: NEGATIVE
SPEC GRAV UA: 1.019 (ref 1.005–1.030)
Urobilinogen, Ur: 0.2 mg/dL (ref 0.2–1.0)

## 2014-12-21 LAB — URINE CULTURE

## 2014-12-27 ENCOUNTER — Encounter: Payer: Self-pay | Admitting: Vascular Surgery

## 2015-01-01 ENCOUNTER — Encounter: Payer: Self-pay | Admitting: Vascular Surgery

## 2015-01-01 ENCOUNTER — Ambulatory Visit (HOSPITAL_COMMUNITY)
Admission: RE | Admit: 2015-01-01 | Discharge: 2015-01-01 | Disposition: A | Discharge status: Home / Self Care | Payer: Medicare Other | Source: Ambulatory Visit | Attending: Vascular Surgery | Admitting: Vascular Surgery

## 2015-01-01 ENCOUNTER — Ambulatory Visit (INDEPENDENT_AMBULATORY_CARE_PROVIDER_SITE_OTHER): Payer: Medicare Other | Admitting: Vascular Surgery

## 2015-01-01 VITALS — BP 126/83 | HR 54 | Ht 67.0 in | Wt 200.0 lb

## 2015-01-01 DIAGNOSIS — E119 Type 2 diabetes mellitus without complications: Secondary | ICD-10-CM | POA: Diagnosis not present

## 2015-01-01 DIAGNOSIS — I10 Essential (primary) hypertension: Secondary | ICD-10-CM | POA: Diagnosis not present

## 2015-01-01 DIAGNOSIS — Z9889 Other specified postprocedural states: Secondary | ICD-10-CM | POA: Diagnosis not present

## 2015-01-01 DIAGNOSIS — I6523 Occlusion and stenosis of bilateral carotid arteries: Secondary | ICD-10-CM | POA: Insufficient documentation

## 2015-01-01 DIAGNOSIS — I6522 Occlusion and stenosis of left carotid artery: Secondary | ICD-10-CM

## 2015-01-01 DIAGNOSIS — E785 Hyperlipidemia, unspecified: Secondary | ICD-10-CM | POA: Diagnosis not present

## 2015-01-01 NOTE — Patient Instructions (Signed)
Stroke Prevention °Some health problems and behaviors may make it more likely for you to have a stroke. Below are ways to lessen your risk of having a stroke.  °· Be active for at least 30 minutes on most or all days. °· Do not smoke. Try not to be around others who smoke. °· Do not drink too much alcohol. °¨ Do not have more than 2 drinks a day if you are a man. °¨ Do not have more than 1 drink a day if you are a woman and are not pregnant. °· Eat healthy foods, such as fruits and vegetables. If you were put on a specific diet, follow the diet as told. °· Keep your cholesterol levels under control through diet and medicines. Look for foods that are low in saturated fat, trans fat, cholesterol, and are high in fiber. °· If you have diabetes, follow all diet plans and take your medicine as told. °· Ask your doctor if you need treatment to lower your blood pressure. If you have high blood pressure (hypertension), follow all diet plans and take your medicine as told by your doctor. °· If you are 18-39 years old, have your blood pressure checked every 3-5 years. If you are age 40 or older, have your blood pressure checked every year. °· Keep a healthy weight. Eat foods that are low in calories, salt, saturated fat, trans fat, and cholesterol. °· Do not take drugs. °· Avoid birth control pills, if this applies. Talk to your doctor about the risks of taking birth control pills. °· Talk to your doctor if you have sleep problems (sleep apnea). °· Take all medicine as told by your doctor. °¨ You may be told to take aspirin or blood thinner medicine. Take this medicine as told by your doctor. °¨ Understand your medicine instructions. °· Make sure any other conditions you have are being taken care of. °GET HELP RIGHT AWAY IF: °· You suddenly lose feeling (you feel numb) or have weakness in your face, arm, or leg. °· Your face or eyelid hangs down to one side. °· You suddenly feel confused. °· You have trouble talking (aphasia)  or understanding what people are saying. °· You suddenly have trouble seeing in one or both eyes. °· You suddenly have trouble walking. °· You are dizzy. °· You lose your balance or your movements are clumsy (uncoordinated). °· You suddenly have a very bad headache and you do not know the cause. °· You have new chest pain. °· Your heart feels like it is fluttering or skipping a beat (irregular heartbeat). °Do not wait to see if the symptoms above go away. Get help right away. Call your local emergency services (911 in [place] Do not drive yourself to the hospital. °  °This information is not intended to replace advice given to you by your health care provider. Make sure you discuss any questions you have with your health care provider. °  °Document Released: 08/18/2011 Document Revised: 03/09/2014 Document Reviewed: 08/19/2012 °Elsevier Interactive Patient Education ©2016 [employer] ° °

## 2015-01-01 NOTE — Progress Notes (Signed)
Patient is today for follow-up of his left carotid endarterectomy for symptomatic carotid disease on 06/08/2014. He looks quite good today. He has completely returned to his normal neurologic Ace line with no new deficits. He denies any cardiac difficulty as well. He reports that he continues to have some mild numbness around the incision and occasional irritated feeling as well reports this continues to improve and is not limiting to him.  Past Medical History  Diagnosis Date  . Unspecified vitamin D deficiency   . Urethral stricture unspecified   . Hypertrophy of prostate with urinary obstruction and other lower urinary tract symptoms (LUTS)   . Type II or unspecified type diabetes mellitus without mention of complication, not stated as uncontrolled   . Anxiety state, unspecified   . 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 Urinary frequency   . Insomnia, unspecified   . Unspecified glaucoma   . 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 Calculus of kidney   . Other and unspecified hyperlipidemia   . Reflux esophagitis   . Lumbago   . Unspecified essential hypertension   . Herpes zoster without mention of complication   . Glaucoma   . Pneumonia     bronchial  . Wears glasses   . TIA (transient ischemic attack)   . Seasonal allergies   . History of hiatal hernia   . Complication of anesthesia     " I woke up in the recovery room with tube in throat and panicked."    Social History  Substance Use Topics  . Smoking status: Former Smoker    Types: Cigarettes    Quit date: 01/01/1991  . Smokeless tobacco: Never Used     Comment: " Quit around 1986"  . Alcohol Use: No    Family History  Problem Relation Age of Onset  . Diabetes Mother   . Cancer Mother   . Hypertension Father   . Stroke Father   . Alcohol abuse Brother     Allergies  Allergen Reactions  . Codeine Other (See Comments)    constipation  . Demerol [Meperidine] Other (See Comments)    Drops blood  pressure      Current outpatient prescriptions:  .  acetaminophen (TYLENOL) 500 MG tablet, Take 1,000 mg by mouth every 6 (six) hours as needed for mild pain or moderate pain., Disp: , Rfl:  .  atorvastatin (LIPITOR) 40 MG tablet, TAKE ONE TABLET BY MOUTH ONCE DAILY FOR CHOLESTEROL, Disp: 30 tablet, Rfl: 3 .  Cholecalciferol (VITAMIN D PO), Take 1 tablet by mouth daily at 12 noon., Disp: , Rfl:  .  clopidogrel (PLAVIX) 75 MG tablet, TAKE ONE TABLET BY MOUTH ONE TIME DAILY, Disp: 30 tablet, Rfl: 5 .  LORazepam (ATIVAN) 0.5 MG tablet, TAKE ONE TABLET BY MOUTH EVERY EIGHT HOURS AS NEEDED, Disp: 90 tablet, Rfl: 1 .  nicotine polacrilex (COMMIT) 2 MG lozenge, Take 2 mg by mouth 3 (three) times daily. , Disp: , Rfl:  .  timolol (TIMOPTIC) 0.25 % ophthalmic solution, Place 1 drop into both eyes 2 (two) times daily., Disp: , Rfl:   Filed Vitals:   01/01/15 1518 01/01/15 1521  BP: 131/78 143/83  Pulse: 54   Height: 5\' 7"  (1.702 m)   Weight: 200 lb (90.719 kg)   SpO2: 99%     Body mass index is 31.32 kg/(m^2).    On physical exam he is a well-developed gentleman in no acute distress Neurologically he is  grossly intact Radial pulses are 2+ bilaterally Left neck incision is well-healed and he has no bruits bilaterally   Carotid duplex today reveals widely patent endarterectomy with no evidence recurrent stenosis. Right carotid is less than 40% stenosis  Impression and plan stable status post left carotid endarterectomy for symptomatic disease. He will continue his usual activities. He'll notify should he have any neurologic deficit or report to the emergency room history of major. We will see him in 6 months with repeat carotid duplex. If this is unchanged she will then drop back to yearly duplex follow-up

## 2015-01-09 ENCOUNTER — Other Ambulatory Visit: Payer: Self-pay | Admitting: Internal Medicine

## 2015-01-10 ENCOUNTER — Other Ambulatory Visit: Payer: Self-pay | Admitting: *Deleted

## 2015-01-10 MED ORDER — LORAZEPAM 0.5 MG PO TABS: ORAL_TABLET | ORAL | Status: DC

## 2015-01-10 NOTE — Telephone Encounter (Signed)
[facility] sent fax over stating the directions needed to be written for three times daily as needed for insurance to cover. Printed.

## 2015-01-31 ENCOUNTER — Encounter: Payer: Self-pay | Admitting: Internal Medicine

## 2015-02-22 ENCOUNTER — Other Ambulatory Visit: Payer: Self-pay | Admitting: Internal Medicine

## 2015-03-11 ENCOUNTER — Other Ambulatory Visit: Payer: Medicare Other

## 2015-03-13 ENCOUNTER — Ambulatory Visit: Payer: Medicare Other | Admitting: Internal Medicine

## 2015-03-15 ENCOUNTER — Other Ambulatory Visit: Payer: Medicare Other

## 2015-03-15 DIAGNOSIS — E785 Hyperlipidemia, unspecified: Secondary | ICD-10-CM

## 2015-03-15 DIAGNOSIS — E1159 Type 2 diabetes mellitus with other circulatory complications: Secondary | ICD-10-CM

## 2015-03-15 DIAGNOSIS — I10 Essential (primary) hypertension: Secondary | ICD-10-CM

## 2015-03-16 LAB — COMPREHENSIVE METABOLIC PANEL
A/G RATIO: 1.7 (ref 1.1–2.5)
ALT: 16 IU/L (ref 0–44)
AST: 21 IU/L (ref 0–40)
Albumin: 4.1 g/dL (ref 3.5–4.8)
Alkaline Phosphatase: 135 IU/L — ABNORMAL HIGH (ref 39–117)
BILIRUBIN TOTAL: 0.3 mg/dL (ref 0.0–1.2)
BUN/Creatinine Ratio: 20 (ref 10–22)
BUN: 18 mg/dL (ref 8–27)
CHLORIDE: 104 mmol/L (ref 96–106)
CO2: 26 mmol/L (ref 18–29)
Calcium: 9.1 mg/dL (ref 8.6–10.2)
Creatinine, Ser: 0.92 mg/dL (ref 0.76–1.27)
GFR calc non Af Amer: 83 mL/min/{1.73_m2} (ref >59)
GFR, EST AFRICAN AMERICAN: 96 mL/min/{1.73_m2} (ref >59)
GLOBULIN, TOTAL: 2.4 g/dL (ref 1.5–4.5)
Glucose: 100 mg/dL — ABNORMAL HIGH (ref 65–99)
POTASSIUM: 4.5 mmol/L (ref 3.5–5.2)
SODIUM: 144 mmol/L (ref 134–144)
Total Protein: 6.5 g/dL (ref 6.0–8.5)

## 2015-03-16 LAB — LIPID PANEL
CHOL/HDL RATIO: 2.9 ratio (ref 0.0–5.0)
Cholesterol, Total: 89 mg/dL — ABNORMAL LOW (ref 100–199)
HDL: 31 mg/dL — AB (ref >39)
LDL Calculated: 46 mg/dL (ref 0–99)
TRIGLYCERIDES: 59 mg/dL (ref 0–149)
VLDL Cholesterol Cal: 12 mg/dL (ref 5–40)

## 2015-03-16 LAB — HEMOGLOBIN A1C
ESTIMATED AVERAGE GLUCOSE: 117 mg/dL
HEMOGLOBIN A1C: 5.7 % — AB (ref 4.8–5.6)

## 2015-03-20 ENCOUNTER — Ambulatory Visit (INDEPENDENT_AMBULATORY_CARE_PROVIDER_SITE_OTHER): Payer: Medicare Other | Admitting: Internal Medicine

## 2015-03-20 ENCOUNTER — Encounter: Payer: Self-pay | Admitting: Internal Medicine

## 2015-03-20 VITALS — BP 110/80 | HR 65 | Temp 98.3°F | Resp 20 | Ht 67.0 in | Wt 202.6 lb

## 2015-03-20 DIAGNOSIS — I10 Essential (primary) hypertension: Secondary | ICD-10-CM | POA: Diagnosis not present

## 2015-03-20 DIAGNOSIS — E785 Hyperlipidemia, unspecified: Secondary | ICD-10-CM

## 2015-03-20 DIAGNOSIS — E1159 Type 2 diabetes mellitus with other circulatory complications: Secondary | ICD-10-CM | POA: Diagnosis not present

## 2015-03-20 DIAGNOSIS — H409 Unspecified glaucoma: Secondary | ICD-10-CM | POA: Diagnosis not present

## 2015-03-20 NOTE — Progress Notes (Signed)
Patient ID: Marcus [name], male   DOB: 31-Mar-1940, 75 y.o.   MRN: [id number]    Facility  [facility]    Place of Service:   OFFICE    Allergies  Allergen Reactions  . Codeine Other (See Comments)    constipation  . Demerol [Meperidine] Other (See Comments)    Drops blood pressure     Chief Complaint  Patient presents with  . Medical Management of Chronic Issues    HPI:  Essential hypertension - controlled  HLD (hyperlipidemia) - controlled  Type 2 diabetes mellitus with other circulatory complications ([place]) - controlled  Glaucoma - Present about 6 years; followed by Dr. Talbert Forest. Recently started on Timolol. Has had surgery in the past    Medications: Patient's Medications  New Prescriptions   No medications on file  Previous Medications   ACETAMINOPHEN (TYLENOL) 500 MG TABLET    Take 1,000 mg by mouth every 6 (six) hours as needed for mild pain or moderate pain.   ATORVASTATIN (LIPITOR) 40 MG TABLET    TAKE ONE TABLET BY MOUTH ONCE DAILY FOR CHOLESTEROL   CHOLECALCIFEROL (VITAMIN D PO)    Take 1 tablet by mouth daily at 12 noon.   CLOPIDOGREL (PLAVIX) 75 MG TABLET    TAKE ONE TABLET BY MOUTH ONE TIME DAILY   LORAZEPAM (ATIVAN) 0.5 MG TABLET    TAKE 1 TABLET BY MOUTH THREE TIMES A DAY AS NEEDED FOR ANXIETY   NICOTINE POLACRILEX (COMMIT) 2 MG LOZENGE    Take 2 mg by mouth 3 (three) times daily.    TIMOLOL (TIMOPTIC) 0.25 % OPHTHALMIC SOLUTION    Place 1 drop into both eyes 2 (two) times daily.  Modified Medications   No medications on file  Discontinued Medications   TIMOLOL (TIMOPTIC) 0.5 % OPHTHALMIC SOLUTION    INSTILL ONE DROPS INTO EACH EYE TWICE DAILY    Review of Systems  Constitutional: Negative for fever, activity change, appetite change, fatigue and unexpected weight change.  HENT: Negative for congestion, ear pain, hearing loss, rhinorrhea, sinus pressure, sore throat, tinnitus, trouble swallowing and voice change.   Eyes:       Hx glaucoma  Respiratory:  Negative for cough, choking, chest tightness, shortness of breath and wheezing.   Cardiovascular: Negative.  Negative for chest pain, palpitations and leg swelling.  Gastrointestinal: Positive for constipation. Negative for nausea, abdominal pain, diarrhea and abdominal distention.  Endocrine: Negative for cold intolerance, heat intolerance, polydipsia, polyphagia and polyuria.       History of mild elevations in glucose  Genitourinary: Positive for frequency. Negative for dysuria, urgency and testicular pain.       Not incontinent  Musculoskeletal: Positive for back pain. Negative for myalgias, arthralgias, gait problem and neck pain.  Skin: Negative.  Negative for color change, pallor and rash.  Allergic/Immunologic: Negative.   Neurological: Negative for dizziness, tremors, syncope, speech difficulty, weakness, numbness and headaches.       TIA 06/01/2014. Left carotid endarterectomy 06/08/14.  Hematological: Negative.  Negative for adenopathy. Does not bruise/bleed easily.  Psychiatric/Behavioral: Negative.  Negative for hallucinations, behavioral problems, confusion, sleep disturbance and decreased concentration. The patient is not nervous/anxious.     Filed Vitals:   03/20/15 1513  BP: 110/80  Pulse: 65  Temp: 98.3 F (36.8 C)  TempSrc: Oral  Resp: 20  Height: 5' 7"  (1.702 m)  Weight: 202 lb 9.6 oz (91.899 kg)  SpO2: 96%   Body mass index is 31.72 kg/(m^2). Filed Weights  03/20/15 1513  Weight: 202 lb 9.6 oz (91.899 kg)     Physical Exam  Constitutional: He is oriented to person, place, and time. He appears well-developed and well-nourished. No distress.  HENT:  Head: Atraumatic.  Right Ear: External ear normal.  Left Ear: External ear normal.  Nose: Nose normal.  Mouth/Throat: No oropharyngeal exudate.  Eyes: Conjunctivae and EOM are normal. Pupils are equal, round, and reactive to light.  Neck: No JVD present. No tracheal deviation present. No thyromegaly present.    Surgical incision from left endarterectomy is healed. No bruit.  Cardiovascular: Normal rate, regular rhythm, normal heart sounds and intact distal pulses.  Exam reveals no gallop and no friction rub.   No murmur heard. Pulmonary/Chest: No respiratory distress. He has no wheezes. He has no rales.  Abdominal: He exhibits no distension and no mass. There is no tenderness.  Genitourinary: Rectum normal and prostate normal. Guaiac negative stool.  Musculoskeletal: Normal range of motion. He exhibits no edema or tenderness.  Lower back pain. Cannot remain in one position long. Has to move. Mildly tender to percussion in the lower back. Walks bent forward.  Lymphadenopathy:    He has no cervical adenopathy.  Neurological: He is alert and oriented to person, place, and time. He displays abnormal reflex. No cranial nerve deficit. Coordination normal.  Absent patellar deep tendon reflex bilaterally. Normal sensations to vibration and monofilament testing.  Skin: No rash noted. No erythema. No pallor.  Psychiatric: He has a normal mood and affect. His behavior is normal. Judgment and thought content normal.    Labs reviewed: Lab Summary Latest Ref Rng 03/15/2015 11/02/2014 06/09/2014 06/08/2014  Hemoglobin 13.0 - 17.0 g/dL (None) (None) 12.4(L) 12.8(L)  Hematocrit 39.0 - 52.0 % (None) (None) 37.1(L) 39.2  White count 4.0 - 10.5 K/uL (None) (None) 5.6 7.7  Platelet count 150 - 400 K/uL (None) (None) 176 180  Sodium 134 - 144 mmol/L 144 141 137 (None)  Potassium 3.5 - 5.2 mmol/L 4.5 4.6 3.8 (None)  Calcium 8.6 - 10.2 mg/dL 9.1 9.2 8.1(L) (None)  Phosphorus - (None) (None) (None) (None)  Creatinine 0.76 - 1.27 mg/dL 0.92 1.08 1.06 1.09  AST 0 - 40 IU/L 21 22 (None) (None)  Alk Phos 39 - 117 IU/L 135(H) 145(H) (None) (None)  Bilirubin 0.0 - 1.2 mg/dL 0.3 0.4 (None) (None)  Glucose 65 - 99 mg/dL 100(H) 103(H) 129(H) (None)  Cholesterol - (None) (None) (None) (None)  HDL cholesterol >39 mg/dL 31(L) 26(L)  (None) (None)  Triglycerides 0 - 149 mg/dL 59 66 (None) (None)  LDL Direct - (None) (None) (None) (None)  LDL Calc 0 - 99 mg/dL 46 53 (None) (None)  Total protein - (None) (None) (None) (None)  Albumin 3.5 - 4.8 g/dL 4.1 3.7 (None) (None)   Lab Results  Component Value Date   TSH 1.620 01/15/2014   TSH 1.150 11/08/2013   Lab Results  Component Value Date   BUN 18 03/15/2015   BUN 20 11/02/2014   BUN 11 06/09/2014   Lab Results  Component Value Date   HGBA1C 5.7* 03/15/2015   HGBA1C 6.0* 11/02/2014   HGBA1C 5.8* 06/01/2014    Assessment/Plan  1. Essential hypertension - Comprehensive metabolic panel; Future  2. HLD (hyperlipidemia) - Lipid panel; Future  3. Type 2 diabetes mellitus with other circulatory complications ([facility]) - Hemoglobin A1c; Future - Comprehensive metabolic panel; Future - Microalbumin, urine; Future  4. Glaucoma Continue Timolol

## 2015-04-03 ENCOUNTER — Other Ambulatory Visit: Payer: Self-pay | Admitting: Internal Medicine

## 2015-04-10 ENCOUNTER — Encounter: Payer: Self-pay | Admitting: Neurology

## 2015-04-10 ENCOUNTER — Ambulatory Visit (INDEPENDENT_AMBULATORY_CARE_PROVIDER_SITE_OTHER): Payer: Medicare Other | Admitting: Neurology

## 2015-04-10 VITALS — BP 102/67 | HR 60 | Ht 67.0 in | Wt 203.8 lb

## 2015-04-10 DIAGNOSIS — I6522 Occlusion and stenosis of left carotid artery: Secondary | ICD-10-CM | POA: Diagnosis not present

## 2015-04-10 DIAGNOSIS — I10 Essential (primary) hypertension: Secondary | ICD-10-CM

## 2015-04-10 DIAGNOSIS — E785 Hyperlipidemia, unspecified: Secondary | ICD-10-CM | POA: Diagnosis not present

## 2015-04-10 DIAGNOSIS — I63232 Cerebral infarction due to unspecified occlusion or stenosis of left carotid arteries: Secondary | ICD-10-CM | POA: Diagnosis not present

## 2015-04-10 DIAGNOSIS — Z9889 Other specified postprocedural states: Secondary | ICD-10-CM | POA: Insufficient documentation

## 2015-04-10 NOTE — Progress Notes (Signed)
STROKE NEUROLOGY FOLLOW UP NOTE  NAME: Marcus [name] DOB: 12-May-1940  REASON FOR VISIT: stroke follow up HISTORY FROM: chart and pt and wife  Today we had the pleasure of seeing Marcus [name] in follow-up at our Neurology Clinic. Pt was accompanied by wife.   History Summary Mr. Marcus [name]. is a 75 y.o. male with history of DM, HTN, HLD, smoker admitted on 06/01/14 for transient aphasia and left hand numbness. MRI showed left MCA territory stroke, embolic pattern. MRA was normal and CTA neck showed left ICA significant plaque formation and stenosis. Other stroke w/u including 2D echo, DVT screening all negateive. LD 126 and A1C 5.8. He has vascular surgery consult and Dr. Donnetta Hutching did left CEA on 06/08/14 successfully.   10/10/14 follow up - the patient has been doing well. Left neck wound healed well. No residue deficit left. Had appointment with PCP in 11/2014 and he will follow up with Dr, Early in 01/2015. Pt still on ASA and plavix and BP 106/72.    Interval History During the interval time, pt has been doing well. No complains. Has followed up with Dr. Donnetta Hutching and will repeat CUS in 6 months. Followed up with PCP, repeat A1C and LDL, all at goal. BP today 102/67.  REVIEW OF SYSTEMS: Full 14 system review of systems performed and notable only for those listed below and in HPI above, all others are negative:  Constitutional:   Cardiovascular:  Ear/Nose/Throat:   Skin: Eyes:   Respiratory:   Gastroitestinal:   Genitourinary:  Hematology/Lymphatic:   Endocrine:  Musculoskeletal:   Allergy/Immunology:   Neurological:    Psychiatric:  Sleep:   The following represents the patient's updated allergies and side effects list: Allergies  Allergen Reactions  . Codeine Other (See Comments)    constipation  . Demerol [Meperidine] Other (See Comments)    Drops blood pressure     The neurologically relevant items on the patient's problem list were reviewed on today's  visit.  Neurologic Examination  A problem focused neurological exam (12 or more points of the single system neurologic examination, vital signs counts as 1 point, cranial nerves count for 8 points) was performed.  Blood pressure 102/67, pulse 60, height 5\' 7"  (1.702 m), weight 203 lb 12.8 oz (92.443 kg).  General - Well nourished, well developed, in no apparent distress.  Ophthalmologic - Sharp disc margins OU. Fundi not visualized due to .  Cardiovascular - Regular rate and rhythm with no murmur.  Mental Status -  Level of arousal and orientation to time, place, and person were intact. Language including expression, naming, repetition, comprehension was assessed and found intact. Attention span and concentration were normal. Recent and remote memory were intact. Fund of Knowledge was assessed and was intact.  Cranial Nerves II - XII - II - Visual field intact OU. III, IV, VI - Extraocular movements intact. V - Facial sensation intact bilaterally. VII - Facial movement intact bilaterally. VIII - Hearing & vestibular intact bilaterally. X - Palate elevates symmetrically. XI - Chin turning & shoulder shrug intact bilaterally. XII - Tongue protrusion intact.  Motor Strength - The patient's strength was normal in all extremities and pronator drift was absent.  Bulk was normal and fasciculations were absent.   Motor Tone - Muscle tone was assessed at the neck and appendages and was normal.  Reflexes - The patient's reflexes were 1+ in all extremities and he had no pathological reflexes.  Sensory - Light touch, temperature/pinprick, vibration  and proprioception, and Romberg testing were assessed and were normal.    Coordination - The patient had normal movements in the hands and feet with no ataxia or dysmetria.  Tremor was absent.  Gait and Station - The patient's transfers, posture, gait, station, and turns were observed as normal.  Data reviewed: I personally reviewed the  images and agree with the radiology interpretations.  Ct Head Wo Contrast 06/01/2014  All diffuse atrophy with rather minimal periventricular small vessel disease. No intracranial mass, hemorrhage, or acute appearing infarct. Minimal right maxillary paranasal sinus disease.    Mri and Mra Head Wo Contrast 06/01/2014  Two 1 cm areas of acute infarction in the left hemisphere consistent with embolic disease in the left middle cerebral artery territory, located at the posterior parietal region and parietal occipital junction region.  No hemorrhage or mass effect.  Normal MR angiography of the large and medium size vessels.   2D echo - - Procedure narrative: Transthoracic echocardiography. Image quality was poor. The study was technically difficult, as a result of poor acoustic windows. - Left ventricle: The cavity size was normal. There was mild concentric hypertrophy. Systolic function was normal. The estimated ejection fraction was in the range of 60% to 65%. Images were inadequate for LV wall motion assessment. Doppler parameters are consistent with abnormal left ventricular relaxation (grade 1 diastolic dysfunction). - Mitral valve: Mildly calcified annulus. Normal thickness leaflets . - Left atrium: The atrium was mildly dilated. - Right atrium: The atrium was mildly dilated.  CUS - 1-39% right ICA stenosis. 40-59% left ICA stenosis, lowest end of scale. Vertebral artery flow is antegrade.  LE venous doppler - Preliminary report: There is no DVT or SVT noted in the bilateral lower extremities.   CTA neck - Motion degraded exam. 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 Numerous serial stenoses, most on the order of 50%. Vessel regains a normal appearance distal to the bulb. Mild atherosclerotic disease at the right carotid bifurcation without stenosis.  CUS  01/01/15 - right ICA < 40% and left ICA s/p CEA patent  Component     Latest Ref Rng 06/01/2014 06/02/2014 11/02/2014 03/15/2015  Cholesterol     0 - 200 mg/dL  167    Triglycerides     0 - 149 mg/dL  91 66 59  HDL Cholesterol     >39 mg/dL  23 (L) 26 (L) 31 (L)  Total CHOL/HDL Ratio     0.0 - 5.0 ratio units  7.3 3.5 2.9  VLDL     0 - 40 mg/dL  18    LDL (calc)     0 - 99 mg/dL  126 (H) 53 46  Cholesterol, Total     100 - 199 mg/dL   92 (L) 89 (L)  VLDL Cholesterol Cal     5 - 40 mg/dL   13 12  Hemoglobin A1C     4.8 - 5.6 % 5.8 (H)  6.0 (H) 5.7 (H)  Mean Plasma Glucose      120     Est. average glucose Bld gHb Est-mCnc        126 117   Assessment: As you may recall, he is a 75 y.o. Caucasian male with PMH of DM, HTN, HLD, smoker admitted on 06/01/14 for transient aphasia and left hand numbness. MRI showed left MCA territory stroke, embolic pattern. MRA was normal and CTA neck  showed left ICA significant plaque formation and stenosis. Other stroke w/u including 2D echo, DVT screening all negateive. LD 126 and A1C 5.8. Etiology most likely from large vessel athero with embolization. He has vascular surgery consult and Dr. Donnetta Hutching did left CEA on 06/08/14 successfully. Pt recovered well. Finished off DAPT and now on plavix and lipitor. Followed up with PCP and risk factors are in good control.  Plan:  - continue plavix and lipitor for stroke prevention - check BP at home - Follow up with your primary care physician for stroke risk factor modification. Recommend maintain blood pressure goal <130/80, diabetes with hemoglobin A1c goal below 6.5% and lipids with LDL cholesterol goal below 70 mg/dL.  - continue to follow up with Dr. Donnetta Hutching. - follow up as needed.  No orders of the defined types were placed in this encounter.    No orders of the defined types were placed in this encounter.    Patient Instructions  - continue plavix and lipitor for stroke prevention - check BP at home - Follow  up with your primary care physician for stroke risk factor modification. Recommend maintain blood pressure goal <130/80, diabetes with hemoglobin A1c goal below 6.5% and lipids with LDL cholesterol goal below 70 mg/dL.  - continue to follow up with Dr. Donnetta Hutching. - follow up as needed.    Rosalin Hawking, MD PhD [facility] [address], [place], [place] [address] [phone]

## 2015-04-10 NOTE — Patient Instructions (Signed)
-   continue plavix and lipitor for stroke prevention - check BP at home - Follow up with your primary care physician for stroke risk factor modification. Recommend maintain blood pressure goal <130/80, diabetes with hemoglobin A1c goal below 6.5% and lipids with LDL cholesterol goal below 70 mg/dL.  - continue to follow up with Dr. Donnetta Hutching. - follow up as needed.

## 2015-05-21 ENCOUNTER — Other Ambulatory Visit: Payer: Self-pay | Admitting: Internal Medicine

## 2015-05-27 ENCOUNTER — Telehealth: Payer: Self-pay

## 2015-05-27 NOTE — Telephone Encounter (Signed)
Received message on triage  voice mail, having pain in upper right chest pain down to rib, can't relieve this. Called patient has not been doing yard work or excising. He gets a headache from this. It's worse when he first wakes up, hurts all the time. Made aptt with ManXie Mast 05/30/15 at 9:30.

## 2015-05-30 ENCOUNTER — Inpatient Hospital Stay: Admission: RE | Admit: 2015-05-30 | Payer: Medicare Other | Source: Ambulatory Visit

## 2015-05-30 ENCOUNTER — Ambulatory Visit (INDEPENDENT_AMBULATORY_CARE_PROVIDER_SITE_OTHER): Payer: Medicare Other | Admitting: Nurse Practitioner

## 2015-05-30 ENCOUNTER — Ambulatory Visit
Admission: RE | Admit: 2015-05-30 | Discharge: 2015-05-30 | Disposition: A | Discharge status: Home / Self Care | Payer: Medicare Other | Source: Ambulatory Visit | Attending: Nurse Practitioner | Admitting: Nurse Practitioner

## 2015-05-30 ENCOUNTER — Other Ambulatory Visit: Payer: Medicare Other

## 2015-05-30 VITALS — BP 118/78 | HR 68 | Temp 97.8°F | Wt 203.6 lb

## 2015-05-30 DIAGNOSIS — I10 Essential (primary) hypertension: Secondary | ICD-10-CM

## 2015-05-30 DIAGNOSIS — R1011 Right upper quadrant pain: Secondary | ICD-10-CM

## 2015-05-30 DIAGNOSIS — G450 Vertebro-basilar artery syndrome: Secondary | ICD-10-CM | POA: Diagnosis not present

## 2015-05-30 DIAGNOSIS — M5440 Lumbago with sciatica, unspecified side: Secondary | ICD-10-CM | POA: Diagnosis not present

## 2015-05-30 NOTE — Progress Notes (Signed)
Patient ID: Marcus [name], male   DOB: Jul 19, 1940, 75 y.o.   MRN: [id number]   Location:   [place]   Place of Service:   [facility] office Provider: Marlana Latus NP  Code Status: DNR Goals of Care:  Advanced Directives 06/08/2014  Does patient have an advance directive? Yes  Type of Advance Directive Living will  Does patient want to make changes to advanced directive? No - Patient declined  Copy of advanced directive(s) in chart? No - copy requested     Chief Complaint  Patient presents with  . Chest Pain    chest pain started March 18th, pain is constant ,pain radiates to back sometimes hurts more when he bends down     HPI: Patient is a 75 y.o. male seen today for an acute visit for right upper abd and right lower chest pain, worsens with deep breath and movements. Denied SOB, cough, nausea, vomiting, or constipation. Diet has changed with meat and veg at the onset of the pain. He is afebrile.    Hx of anxiety, takes Lorazepam 0.5mg  tid prn. Takes Lipitor 40mg  and Plavix 75mg  for cardiovascular risk reduction.   Past Medical History  Diagnosis Date  . Unspecified vitamin D deficiency   . Urethral stricture unspecified   . Hypertrophy of prostate with urinary obstruction and other lower urinary tract symptoms (LUTS)   . Type II or unspecified type diabetes mellitus without mention of complication, not stated as uncontrolled   . Anxiety state, unspecified   . 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 Urinary frequency   . Insomnia, unspecified   . 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 Calculus of kidney   . Other and unspecified hyperlipidemia   . Reflux esophagitis   . Lumbago   . Unspecified essential hypertension   . Herpes zoster without mention of complication   . Pneumonia     bronchial  . TIA (transient ischemic attack)   . Seasonal allergies   . History of hiatal hernia   . Complication of anesthesia     " I woke up in the recovery room with tube in throat and  panicked."  . Stroke [facility])     Past Surgical History  Procedure Laterality Date  . Spinal fusion  12/91,11/91    obtained bone from left lower leg  . Eye surgery  2014    cataract  . Eye surgery  2013    Retina  . Cardiac catheterization  1986    normal, Dr Lia Foyer  . Stress thallium  1994     normal;  . Stretching bladder  07/2007    Dr Risa Grill  . Endarterectomy Left 06/08/2014    Procedure: LEFT CAROTID ARTERY ENDARTERECTOMY;  Surgeon: Rosetta Posner, MD;  Location: [place];  Service: Vascular;  Laterality: Left;  . Patch angioplasty Left 06/08/2014    Procedure: WITH DACRON PATCH ANGIOPLASTY;  Surgeon: Rosetta Posner, MD;  Location: [place];  Service: Vascular;  Laterality: Left;    Allergies  Allergen Reactions  . Codeine Other (See Comments)    constipation  . Demerol [Meperidine] Other (See Comments)    Drops blood pressure       Medication List       This list is accurate as of: 05/30/15  2:00 PM.  Always use your most recent med list.               acetaminophen 500 MG tablet  Commonly  known as:  TYLENOL  Take 1,000 mg by mouth every 6 (six) hours as needed for mild pain or moderate pain.     atorvastatin 40 MG tablet  Commonly known as:  LIPITOR  TAKE ONE TABLET BY MOUTH ONCE DAILY FOR CHOLESTEROL     clopidogrel 75 MG tablet  Commonly known as:  PLAVIX  TAKE ONE TABLET BY MOUTH ONE TIME DAILY     LORazepam 0.5 MG tablet  Commonly known as:  ATIVAN  TAKE 1 TABLET THREE TIMES A DAY AS NEEDED FOR ANXIETY     nicotine polacrilex 2 MG lozenge  Commonly known as:  COMMIT  Take 2 mg by mouth 3 (three) times daily.     timolol 0.25 % ophthalmic solution  Commonly known as:  TIMOPTIC  Place 1 drop into both eyes 2 (two) times daily.     VITAMIN D PO  Take 1 tablet by mouth daily at 12 noon.        Review of Systems:  Review of Systems  Constitutional: Negative for fever, activity change, appetite change, fatigue and unexpected weight change.  HENT:  Negative for congestion, ear pain, hearing loss, rhinorrhea, sinus pressure, sore throat, tinnitus, trouble swallowing and voice change.   Eyes:       Hx glaucoma  Respiratory: Negative for cough, choking, chest tightness, shortness of breath and wheezing.   Cardiovascular: Negative.  Negative for chest pain, palpitations and leg swelling.  Gastrointestinal: Positive for abdominal pain and constipation. Negative for nausea, diarrhea and abdominal distention.       RUQ x 2 weeks.   Endocrine: Negative for cold intolerance, heat intolerance, polydipsia, polyphagia and polyuria.       History of mild elevations in glucose  Genitourinary: Positive for frequency. Negative for dysuria, urgency and testicular pain.       Not incontinent  Musculoskeletal: Positive for back pain. Negative for myalgias, arthralgias, gait problem and neck pain.  Skin: Negative.  Negative for color change, pallor and rash.  Allergic/Immunologic: Negative.   Neurological: Negative for dizziness, tremors, syncope, speech difficulty, weakness, numbness and headaches.       TIA 06/01/2014. Left carotid endarterectomy 06/08/14.  Hematological: Negative.  Negative for adenopathy. Does not bruise/bleed easily.  Psychiatric/Behavioral: Negative.  Negative for hallucinations, behavioral problems, confusion, sleep disturbance and decreased concentration. The patient is not nervous/anxious.     Health Maintenance  Topic Date Due  . TETANUS/TDAP  02/19/1962  . COLONOSCOPY  02/19/1993  . ZOSTAVAX  02/20/2003  . OPHTHALMOLOGY EXAM  03/21/2013  . HEMOGLOBIN A1C  09/12/2015  . INFLUENZA VACCINE  10/01/2015  . URINE MICROALBUMIN  11/02/2015  . FOOT EXAM  11/07/2015  . PNA vac Low Risk Adult  Completed    Physical Exam: Filed Vitals:   05/30/15 0948  BP: 118/78  Pulse: 68  Temp: 97.8 F (36.6 C)  TempSrc: Oral  Weight: 203 lb 9.6 oz (92.352 kg)  SpO2: 91%   Body mass index is 31.88 kg/(m^2). Physical Exam    Constitutional: He is oriented to person, place, and time. He appears well-developed and well-nourished. No distress.  HENT:  Head: Atraumatic.  Right Ear: External ear normal.  Left Ear: External ear normal.  Nose: Nose normal.  Mouth/Throat: No oropharyngeal exudate.  Eyes: Conjunctivae and EOM are normal. Pupils are equal, round, and reactive to light.  Neck: No JVD present. No tracheal deviation present. No thyromegaly present.  Surgical incision from left endarterectomy is healed. No bruit.  Cardiovascular:  Normal rate, regular rhythm, normal heart sounds and intact distal pulses.  Exam reveals no gallop and no friction rub.   No murmur heard. Pulmonary/Chest: No respiratory distress. He has no wheezes. He has no rales.  Abdominal: He exhibits no distension and no mass. There is tenderness.  RUQ, + murphy's sign.  Genitourinary: Rectum normal and prostate normal. Guaiac negative stool.  Musculoskeletal: Normal range of motion. He exhibits no edema or tenderness.  Lower back pain. Cannot remain in one position long. Has to move. Mildly tender to percussion in the lower back. Walks bent forward.  Lymphadenopathy:    He has no cervical adenopathy.  Neurological: He is alert and oriented to person, place, and time. He displays abnormal reflex. No cranial nerve deficit. Coordination normal.  Absent patellar deep tendon reflex bilaterally. Normal sensations to vibration and monofilament testing.  Skin: No rash noted. No erythema. No pallor.  Psychiatric: He has a normal mood and affect. His behavior is normal. Judgment and thought content normal.    Labs reviewed: Basic Metabolic Panel:  Recent Labs  06/09/14 0240 11/02/14 0830 03/15/15 0803  NA 137 141 144  K 3.8 4.6 4.5  CL 103 102 104  CO2 25 26 26   GLUCOSE 129* 103* 100*  BUN 11 20 18   CREATININE 1.06 1.08 0.92  CALCIUM 8.1* 9.2 9.1   Liver Function Tests:  Recent Labs  06/01/14 1155 11/02/14 0830 03/15/15 0803   AST 24 22 21   ALT 19 16 16   ALKPHOS 121* 145* 135*  BILITOT 0.5 0.4 0.3  PROT 7.0 6.8 6.5  ALBUMIN 3.5 3.7 4.1   No results for input(s): LIPASE, AMYLASE in the last 8760 hours. No results for input(s): AMMONIA in the last 8760 hours. CBC:  Recent Labs  06/01/14 1155 06/06/14 1324 06/08/14 1515 06/09/14 0240  WBC 5.0  --  7.7 5.6  HGB 14.3 16.7 12.8* 12.4*  HCT 43.9 49.0 39.2 37.1*  MCV 96.3  --  95.4 94.4  PLT 177  --  180 176   Lipid Panel:  Recent Labs  06/02/14 0544 11/02/14 0830 03/15/15 0803  CHOL 167 92* 89*  HDL 23* 26* 31*  LDLCALC 126* 53 46  TRIG 91 66 59  CHOLHDL 7.3 3.5 2.9   Lab Results  Component Value Date   HGBA1C 5.7* 03/15/2015    Procedures since last visit: No results found.  Assessment/Plan Abdominal pain, right upper quadrant X 2 weeks, constant, worsens with cough or deep breath, denied nausea, vomiting, cough, dysuria, or change of bowel habit, he is afebrile. Will obtain US gallbladder, liver, pancrease, CBC, CMP, TSH, UA C/S, amylase, Lipase, CXR, X-ray thoracic spine, lumbar spine.  EKG today, SR, heart rate 60, no significant ST-T changes.   TIA (transient ischemic attack) Continue Plavix and Atorvastatin for cardiovascular risk reduction.   Lumbago Hx of back pain, obtain lumbar spine X-ray  Essential hypertension Blood pressure is controlled w/o meds.      Labs/tests ordered:  @ORDERS @ CBC, CMP, Lipase, Amylase, UA C/S, TSH, [place] RUQ, CXR, X-ray thoracic spine, lumbar spine.   Next appt:  09/16/2015

## 2015-05-30 NOTE — Assessment & Plan Note (Signed)
Blood pressure is controlled w/o meds.  ?

## 2015-05-30 NOTE — Assessment & Plan Note (Signed)
Hx of back pain, obtain lumbar spine X-ray

## 2015-05-30 NOTE — Assessment & Plan Note (Addendum)
X 2 weeks, constant, worsens with cough or deep breath, denied nausea, vomiting, cough, dysuria, or change of bowel habit, he is afebrile. Will obtain US gallbladder, liver, pancrease, CBC, CMP, TSH, UA C/S, amylase, Lipase, CXR, X-ray thoracic spine, lumbar spine.  EKG today, SR, heart rate 60, no significant ST-T changes.

## 2015-05-30 NOTE — Assessment & Plan Note (Signed)
Continue Plavix and Atorvastatin for cardiovascular risk reduction.

## 2015-05-31 ENCOUNTER — Ambulatory Visit
Admission: RE | Admit: 2015-05-31 | Discharge: 2015-05-31 | Disposition: A | Discharge status: Home / Self Care | Payer: Medicare Other | Source: Ambulatory Visit | Attending: Nurse Practitioner | Admitting: Nurse Practitioner

## 2015-05-31 DIAGNOSIS — R1011 Right upper quadrant pain: Secondary | ICD-10-CM

## 2015-05-31 LAB — CBC WITH DIFFERENTIAL/PLATELET
BASOS: 1 %
Basophils Absolute: 0 10*3/uL (ref 0.0–0.2)
EOS (ABSOLUTE): 0.2 10*3/uL (ref 0.0–0.4)
Eos: 4 %
HEMOGLOBIN: 15 g/dL (ref 12.6–17.7)
Hematocrit: 44.3 % (ref 37.5–51.0)
IMMATURE GRANS (ABS): 0 10*3/uL (ref 0.0–0.1)
IMMATURE GRANULOCYTES: 0 %
LYMPHS: 34 %
Lymphocytes Absolute: 1.3 10*3/uL (ref 0.7–3.1)
MCH: 32.5 pg (ref 26.6–33.0)
MCHC: 33.9 g/dL (ref 31.5–35.7)
MCV: 96 fL (ref 79–97)
MONOCYTES: 13 %
Monocytes Absolute: 0.5 10*3/uL (ref 0.1–0.9)
NEUTROS ABS: 1.8 10*3/uL (ref 1.4–7.0)
NEUTROS PCT: 48 %
PLATELETS: 207 10*3/uL (ref 150–379)
RBC: 4.61 x10E6/uL (ref 4.14–5.80)
RDW: 13.3 % (ref 12.3–15.4)
WBC: 3.7 10*3/uL (ref 3.4–10.8)

## 2015-05-31 LAB — AMYLASE: Amylase: 254 U/L — ABNORMAL HIGH (ref 31–124)

## 2015-05-31 LAB — LIPASE: LIPASE: 173 U/L — AB (ref 0–59)

## 2015-05-31 LAB — TSH: TSH: 1.66 u[IU]/mL (ref 0.450–4.500)

## 2015-06-01 LAB — URINE CULTURE

## 2015-06-03 ENCOUNTER — Telehealth: Payer: Self-pay

## 2015-06-03 ENCOUNTER — Telehealth: Payer: Self-pay | Admitting: *Deleted

## 2015-06-03 NOTE — Telephone Encounter (Signed)
Patient wife, Jackelyn Poling called and stated that she would like Lab Results and Abdomen Ultrasound results patient had done the other day, ordered by [facility]. They were thinking something Kenzley Ke be wrong with the gallbladder. Would like to know whats going on and the next steps. Please Advise.

## 2015-06-03 NOTE — Telephone Encounter (Signed)
i need to see him. The pancreas enzymes are mildly elevated. The gallbladder seems normal on the scan. The xrays suggest a right kidney stone. He needs to get a urinalysis and may need referral to urologist. Xrays of the spine did not show fracture or bone erosion.

## 2015-06-03 NOTE — Telephone Encounter (Signed)
Spoke with patient advised him that he may have kidney stone in right kidney and asked him if he would like to be referred to Urologist, he stated that he would like to talk it over with his wife first and that he would give our office a call.

## 2015-06-04 NOTE — Telephone Encounter (Signed)
Patient has an appointment scheduled for 06/05/2015 to discuss x-rays.

## 2015-06-05 ENCOUNTER — Ambulatory Visit (INDEPENDENT_AMBULATORY_CARE_PROVIDER_SITE_OTHER): Payer: Medicare Other | Admitting: Internal Medicine

## 2015-06-05 ENCOUNTER — Encounter: Payer: Self-pay | Admitting: Internal Medicine

## 2015-06-05 VITALS — BP 120/78 | HR 82 | Temp 97.5°F | Ht 67.0 in | Wt 202.0 lb

## 2015-06-05 DIAGNOSIS — R1011 Right upper quadrant pain: Secondary | ICD-10-CM | POA: Diagnosis not present

## 2015-06-05 NOTE — Progress Notes (Signed)
Patient ID: Marcus [name], male   DOB: [birth year], 75 y.o.   MRN: [id number]   Location:  [place] [facility]  Provider: Jeanmarie Hubert, M.D.  Code Status: Full Goals of Care:  Advanced Directives 06/05/2015  Does patient have an advance directive? Yes  Type of Advance Directive Living will  Does patient want to make changes to advanced directive? -  Copy of advanced directive(s) in chart? No - copy requested     Chief Complaint  Patient presents with  . Medical Management of Chronic Issues    review x-rays     HPI: Patient is a 75 y.o. male seen today for medical management of right abd pain. Lab work and x-ray showed several potential issues related to the right abdominal pain. Since he was evaluated last week his pain is at least 50% better and he is much less concerned about it. X-rays of the chest and the ultrasound of the abdomen were unremarkable. Thoracic spine x-rays were normal. Lumbar spine x-ray showed some spondylosis. There is also evidence of inferior pole renal stones and a possible distal ureteral stone. Patient has not had any aching in the groin or testicle and has not seen any blood in the urine.  Patient generally drinks no alcohol. The evening before his discomfort in the abdomen started he had at least 2 large margaritas at [employer]. He wonders if this could be related to the onset of his discomfort.   Past Medical History  Diagnosis Date  . Unspecified vitamin D deficiency   . Urethral stricture unspecified   . Hypertrophy of prostate with urinary obstruction and other lower urinary tract symptoms (LUTS)   . Type II or unspecified type diabetes mellitus without mention of complication, not stated as uncontrolled   . Anxiety state, unspecified   . 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 Urinary frequency   . Insomnia, unspecified   . 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 Calculus of kidney   . Other and unspecified hyperlipidemia     . Reflux esophagitis   . Lumbago   . Unspecified essential hypertension   . Herpes zoster without mention of complication   . Pneumonia     bronchial  . TIA (transient ischemic attack)   . Seasonal allergies   . History of hiatal hernia   . Complication of anesthesia     " I woke up in the recovery room with tube in throat and panicked."  . Stroke [facility])     Past Surgical History  Procedure Laterality Date  . Spinal fusion  12/91,11/91    obtained bone from left lower leg  . Eye surgery  2014    cataract  . Eye surgery  2013    Retina  . Cardiac catheterization  1986    normal, Dr Lia Foyer  . Stress thallium  1994     normal;  . Stretching bladder  07/2007    Dr Risa Grill  . Endarterectomy Left 06/08/2014    Procedure: LEFT CAROTID ARTERY ENDARTERECTOMY;  Surgeon: Rosetta Posner, MD;  Location: [place];  Service: Vascular;  Laterality: Left;  . Patch angioplasty Left 06/08/2014    Procedure: WITH DACRON PATCH ANGIOPLASTY;  Surgeon: Rosetta Posner, MD;  Location: [place];  Service: Vascular;  Laterality: Left;    Allergies  Allergen Reactions  . Codeine Other (See Comments)    constipation  . Demerol [Meperidine] Other (See Comments)    Drops blood  pressure       Medication List       This list is accurate as of: 06/05/15  3:52 PM.  Always use your most recent med list.               acetaminophen 500 MG tablet  Commonly known as:  TYLENOL  Take 1,000 mg by mouth every 6 (six) hours as needed for mild pain or moderate pain.     atorvastatin 40 MG tablet  Commonly known as:  LIPITOR  TAKE ONE TABLET BY MOUTH ONCE DAILY FOR CHOLESTEROL     clopidogrel 75 MG tablet  Commonly known as:  PLAVIX  TAKE ONE TABLET BY MOUTH ONE TIME DAILY     LORazepam 0.5 MG tablet  Commonly known as:  ATIVAN  TAKE 1 TABLET THREE TIMES A DAY AS NEEDED FOR ANXIETY     nicotine polacrilex 2 MG lozenge  Commonly known as:  COMMIT  Take 2 mg by mouth 3 (three) times daily.     timolol 0.25 %  ophthalmic solution  Commonly known as:  TIMOPTIC  Place 1 drop into both eyes 2 (two) times daily.     VITAMIN D PO  Take 1 tablet by mouth daily at 12 noon.        Review of Systems:  Review of Systems  Constitutional: Negative for fever, activity change, appetite change, fatigue and unexpected weight change.  HENT: Negative for congestion, ear pain, hearing loss, rhinorrhea, sinus pressure, sore throat, tinnitus, trouble swallowing and voice change.   Eyes:       Hx glaucoma  Respiratory: Negative for cough, choking, chest tightness, shortness of breath and wheezing.   Cardiovascular: Negative.  Negative for chest pain, palpitations and leg swelling.  Gastrointestinal: Positive for abdominal pain and constipation. Negative for nausea, diarrhea and abdominal distention.       RUQ pain has improved  Endocrine: Negative for cold intolerance, heat intolerance, polydipsia, polyphagia and polyuria.       History of mild elevations in glucose  Genitourinary: Positive for frequency. Negative for dysuria, urgency and testicular pain.       Not incontinent  Musculoskeletal: Positive for back pain. Negative for myalgias, arthralgias, gait problem and neck pain.  Skin: Negative.  Negative for color change, pallor and rash.  Allergic/Immunologic: Negative.   Neurological: Negative for dizziness, tremors, syncope, speech difficulty, weakness, numbness and headaches.       TIA 06/01/2014. Left carotid endarterectomy 06/08/14.  Hematological: Negative.  Negative for adenopathy. Does not bruise/bleed easily.  Psychiatric/Behavioral: Negative.  Negative for hallucinations, behavioral problems, confusion, sleep disturbance and decreased concentration. The patient is not nervous/anxious.     Health Maintenance  Topic Date Due  . TETANUS/TDAP  02/19/1962  . COLONOSCOPY  02/19/1993  . ZOSTAVAX  02/20/2003  . OPHTHALMOLOGY EXAM  03/21/2013  . HEMOGLOBIN A1C  09/12/2015  . INFLUENZA VACCINE   10/01/2015  . URINE MICROALBUMIN  11/02/2015  . FOOT EXAM  11/07/2015  . PNA vac Low Risk Adult  Completed    Physical Exam: Filed Vitals:   06/05/15 1512  BP: 120/78  Pulse: 82  Temp: 97.5 F (36.4 C)  TempSrc: Oral  Height: 5\' 7"  (1.702 m)  Weight: 202 lb (91.627 kg)  SpO2: 95%   Body mass index is 31.63 kg/(m^2). Physical Exam  Constitutional: He is oriented to person, place, and time. He appears well-developed and well-nourished. No distress.  HENT:  Head: Atraumatic.  Right Ear: External ear  normal.  Left Ear: External ear normal.  Nose: Nose normal.  Mouth/Throat: No oropharyngeal exudate.  Eyes: Conjunctivae and EOM are normal. Pupils are equal, round, and reactive to light.  Neck: No JVD present. No tracheal deviation present. No thyromegaly present.  Surgical incision from left endarterectomy is healed. No bruit.  Cardiovascular: Normal rate, regular rhythm, normal heart sounds and intact distal pulses.  Exam reveals no gallop and no friction rub.   No murmur heard. Pulmonary/Chest: No respiratory distress. He has no wheezes. He has no rales.  Abdominal: He exhibits no distension and no mass. There is no tenderness.  Genitourinary: Rectum normal and prostate normal. Guaiac negative stool.  Musculoskeletal: Normal range of motion. He exhibits no edema or tenderness.  Lower back pain. Cannot remain in one position long. Has to move. Mildly tender to percussion in the lower back. Walks bent forward.  Lymphadenopathy:    He has no cervical adenopathy.  Neurological: He is alert and oriented to person, place, and time. He displays abnormal reflex. No cranial nerve deficit. Coordination normal.  Absent patellar deep tendon reflex bilaterally. Normal sensations to vibration and monofilament testing.  Skin: No rash noted. No erythema. No pallor.  Psychiatric: He has a normal mood and affect. His behavior is normal. Judgment and thought content normal.    Labs  reviewed: Basic Metabolic Panel:  Recent Labs  06/09/14 0240 11/02/14 0830 03/15/15 0803 05/30/15 1228  NA 137 141 144  --   K 3.8 4.6 4.5  --   CL 103 102 104  --   CO2 25 26 26   --   GLUCOSE 129* 103* 100*  --   BUN 11 20 18   --   CREATININE 1.06 1.08 0.92  --   CALCIUM 8.1* 9.2 9.1  --   TSH  --   --   --  1.660   Liver Function Tests:  Recent Labs  11/02/14 0830 03/15/15 0803  AST 22 21  ALT 16 16  ALKPHOS 145* 135*  BILITOT 0.4 0.3  PROT 6.8 6.5  ALBUMIN 3.7 4.1    Recent Labs  05/30/15 1228  LIPASE 173*  AMYLASE 254*   No results for input(s): AMMONIA in the last 8760 hours. CBC:  Recent Labs  06/06/14 1324 06/08/14 1515 06/09/14 0240 05/30/15 1228  WBC  --  7.7 5.6 3.7  NEUTROABS  --   --   --  1.8  HGB 16.7 12.8* 12.4*  --   HCT 49.0 39.2 37.1* 44.3  MCV  --  95.4 94.4 96  PLT  --  180 176 207   Lipid Panel:  Recent Labs  11/02/14 0830 03/15/15 0803  CHOL 92* 89*  HDL 26* 31*  LDLCALC 53 46  TRIG 66 59  CHOLHDL 3.5 2.9   Lab Results  Component Value Date   HGBA1C 5.7* 03/15/2015    Procedures since last visit: Dg Chest 2 View  05/30/2015  CLINICAL DATA:  Right flank pain today.  Initial encounter. EXAM: CHEST  2 VIEW COMPARISON:  PA and lateral chest 09/22/2013. FINDINGS: The lungs are clear. Heart size is normal. There is no pneumothorax or pleural effusion. Thoracic spondylosis is unchanged in appearance. IMPRESSION: No acute disease. Electronically Signed   By: Inge Rise M.D.   On: 05/30/2015 14:39   Dg Thoracic Spine W/swimmers  05/30/2015  CLINICAL DATA:  Right flank pain today. No known injury. Initial encounter. EXAM: THORACIC SPINE - 3 VIEWS COMPARISON:  CT chest 07/07/2013.  PA and lateral chest 09/22/2013. FINDINGS: There is no fracture or malalignment. Multilevel anterior endplate spurring is noted. Paraspinous structures are unremarkable. IMPRESSION: No acute abnormality.  Thoracic spondylosis. Electronically  Signed   By: Inge Rise M.D.   On: 05/30/2015 14:41   Dg Lumbar Spine Complete  05/30/2015  CLINICAL DATA:  Right-sided pain, history of prior lumbar surgeries EXAM: LUMBAR SPINE - COMPLETE 4+ VIEW COMPARISON:  None. FINDINGS: Four non rib-bearing lumbar vertebra are noted. Pedicle screws with posterior fixation are noted at L3, L4 and S1. Anterolisthesis is noted at L4-S1 and likely chronic in nature. A stimulating device is seen. Anterior osteophytes are noted at all lumbar levels. No other focal abnormality is seen. Aortic calcifications are noted. A few tiny calcifications are noted in the expected region of the right kidney in the lower pole. These may represent nonobstructing stones. A calcification is noted in the right hemipelvis which could represent a distal ureteral stone. Clinical correlation is recommended. IMPRESSION: Small right renal calculi and possible distal right ureteral calculus. The need for CT evaluation can be determined on a clinical basis. Chronic changes in the lumbar spine. Electronically Signed   By: Inez Catalina M.D.   On: 05/30/2015 14:42   US Abdomen Limited Ruq  05/31/2015  CLINICAL DATA:  Approximately 1 week history of right upper quadrant pain EXAM: US ABDOMEN LIMITED - RIGHT UPPER QUADRANT COMPARISON:  None. FINDINGS: Gallbladder: No gallstones or wall thickening visualized. There is no pericholecystic fluid. No sonographic Murphy sign noted by sonographer. Common bile duct: Diameter: 5 mm. There is no intrahepatic or extrahepatic biliary duct dilatation. Liver: No focal lesion identified. Within normal limits in parenchymal echogenicity. IMPRESSION: Study within normal limits. Electronically Signed   By: Lowella Grip III M.D.   On: 05/31/2015 15:54    Assessment/Plan  1. Abdominal pain, right upper quadrant Possible alcohol induced pancreatitis now resolving - Amylase - Lipase - Urine culture   Next appt:  09/16/2015

## 2015-06-06 ENCOUNTER — Other Ambulatory Visit: Payer: Medicare Other

## 2015-06-06 ENCOUNTER — Encounter: Payer: Self-pay | Admitting: Nurse Practitioner

## 2015-06-06 LAB — LIPASE: LIPASE: 166 U/L — AB (ref 0–59)

## 2015-06-06 LAB — AMYLASE: Amylase: 235 U/L — ABNORMAL HIGH (ref 31–124)

## 2015-06-07 ENCOUNTER — Other Ambulatory Visit: Payer: Self-pay

## 2015-06-07 DIAGNOSIS — R748 Abnormal levels of other serum enzymes: Secondary | ICD-10-CM

## 2015-06-09 ENCOUNTER — Other Ambulatory Visit: Payer: Self-pay | Admitting: Internal Medicine

## 2015-06-09 LAB — URINE CULTURE

## 2015-06-09 MED ORDER — AMOXICILLIN-POT CLAVULANATE 875-125 MG PO TABS
ORAL_TABLET | ORAL | Status: DC
Start: 2015-06-09 — End: 2015-07-08

## 2015-06-21 ENCOUNTER — Other Ambulatory Visit: Payer: Medicare Other

## 2015-06-21 DIAGNOSIS — E1159 Type 2 diabetes mellitus with other circulatory complications: Secondary | ICD-10-CM

## 2015-06-21 DIAGNOSIS — R748 Abnormal levels of other serum enzymes: Secondary | ICD-10-CM

## 2015-06-21 DIAGNOSIS — I10 Essential (primary) hypertension: Secondary | ICD-10-CM

## 2015-06-21 DIAGNOSIS — E785 Hyperlipidemia, unspecified: Secondary | ICD-10-CM

## 2015-06-22 LAB — LIPID PANEL
CHOLESTEROL TOTAL: 111 mg/dL (ref 100–199)
Chol/HDL Ratio: 3.7 ratio units (ref 0.0–5.0)
HDL: 30 mg/dL — AB (ref >39)
LDL Calculated: 67 mg/dL (ref 0–99)
TRIGLYCERIDES: 71 mg/dL (ref 0–149)
VLDL Cholesterol Cal: 14 mg/dL (ref 5–40)

## 2015-06-22 LAB — MICROALBUMIN, URINE: Microalbumin, Urine: 35.9 ug/mL

## 2015-06-22 LAB — COMPREHENSIVE METABOLIC PANEL
A/G RATIO: 1.4 (ref 1.2–2.2)
ALK PHOS: 135 IU/L — AB (ref 39–117)
ALT: 17 IU/L (ref 0–44)
AST: 22 IU/L (ref 0–40)
Albumin: 3.9 g/dL (ref 3.5–4.8)
BILIRUBIN TOTAL: 0.3 mg/dL (ref 0.0–1.2)
BUN/Creatinine Ratio: 19 (ref 10–24)
BUN: 20 mg/dL (ref 8–27)
CALCIUM: 9.2 mg/dL (ref 8.6–10.2)
CHLORIDE: 98 mmol/L (ref 96–106)
CO2: 25 mmol/L (ref 18–29)
Creatinine, Ser: 1.08 mg/dL (ref 0.76–1.27)
GFR calc Af Amer: 79 mL/min/{1.73_m2} (ref >59)
GFR, EST NON AFRICAN AMERICAN: 68 mL/min/{1.73_m2} (ref >59)
Globulin, Total: 2.7 g/dL (ref 1.5–4.5)
Glucose: 106 mg/dL — ABNORMAL HIGH (ref 65–99)
POTASSIUM: 4.5 mmol/L (ref 3.5–5.2)
Sodium: 139 mmol/L (ref 134–144)
Total Protein: 6.6 g/dL (ref 6.0–8.5)

## 2015-06-22 LAB — AMYLASE: AMYLASE: 129 U/L — AB (ref 31–124)

## 2015-06-22 LAB — HEMOGLOBIN A1C
ESTIMATED AVERAGE GLUCOSE: 120 mg/dL
HEMOGLOBIN A1C: 5.8 % — AB (ref 4.8–5.6)

## 2015-06-22 LAB — LIPASE: Lipase: 24 U/L (ref 0–59)

## 2015-06-28 ENCOUNTER — Encounter: Payer: Self-pay | Admitting: Family

## 2015-07-04 ENCOUNTER — Other Ambulatory Visit: Payer: Self-pay | Admitting: Vascular Surgery

## 2015-07-08 ENCOUNTER — Ambulatory Visit (HOSPITAL_COMMUNITY)
Admission: RE | Admit: 2015-07-08 | Discharge: 2015-07-08 | Disposition: A | Discharge status: Home / Self Care | Payer: Medicare Other | Source: Ambulatory Visit | Attending: Family | Admitting: Family

## 2015-07-08 ENCOUNTER — Encounter: Payer: Self-pay | Admitting: Family

## 2015-07-08 ENCOUNTER — Ambulatory Visit (INDEPENDENT_AMBULATORY_CARE_PROVIDER_SITE_OTHER): Payer: Medicare Other | Admitting: Family

## 2015-07-08 VITALS — BP 125/78 | HR 61 | Temp 97.6°F | Ht 68.0 in | Wt 202.9 lb

## 2015-07-08 DIAGNOSIS — I6523 Occlusion and stenosis of bilateral carotid arteries: Secondary | ICD-10-CM | POA: Insufficient documentation

## 2015-07-08 DIAGNOSIS — I6522 Occlusion and stenosis of left carotid artery: Secondary | ICD-10-CM

## 2015-07-08 DIAGNOSIS — Z9889 Other specified postprocedural states: Secondary | ICD-10-CM | POA: Diagnosis not present

## 2015-07-08 DIAGNOSIS — I10 Essential (primary) hypertension: Secondary | ICD-10-CM | POA: Insufficient documentation

## 2015-07-08 DIAGNOSIS — Z48812 Encounter for surgical aftercare following surgery on the circulatory system: Secondary | ICD-10-CM | POA: Diagnosis not present

## 2015-07-08 DIAGNOSIS — E119 Type 2 diabetes mellitus without complications: Secondary | ICD-10-CM | POA: Insufficient documentation

## 2015-07-08 NOTE — Patient Instructions (Signed)
Stroke Prevention Some medical conditions and behaviors are associated with an increased chance of having a stroke. You may prevent a stroke by making healthy choices and managing medical conditions. HOW CAN I REDUCE MY RISK OF HAVING A STROKE?   Stay physically active. Get at least 30 minutes of activity on most or all days.  Do not smoke. It may also be helpful to avoid exposure to secondhand smoke.  Limit alcohol use. Moderate alcohol use is considered to be:  No more than 2 drinks per day for men.  No more than 1 drink per day for nonpregnant women.  Eat healthy foods. This involves:  Eating 5 or more servings of fruits and vegetables a day.  Making dietary changes that address high blood pressure (hypertension), high cholesterol, diabetes, or obesity.  Manage your cholesterol levels.  Making food choices that are high in fiber and low in saturated fat, trans fat, and cholesterol may control cholesterol levels.  Take any prescribed medicines to control cholesterol as directed by your health care provider.  Manage your diabetes.  Controlling your carbohydrate and sugar intake is recommended to manage diabetes.  Take any prescribed medicines to control diabetes as directed by your health care provider.  Control your hypertension.  Making food choices that are low in salt (sodium), saturated fat, trans fat, and cholesterol is recommended to manage hypertension.  Ask your health care provider if you need treatment to lower your blood pressure. Take any prescribed medicines to control hypertension as directed by your health care provider.  If you are 18-39 years of age, have your blood pressure checked every 3-5 years. If you are 40 years of age or older, have your blood pressure checked every year.  Maintain a healthy weight.  Reducing calorie intake and making food choices that are low in sodium, saturated fat, trans fat, and cholesterol are recommended to manage  weight.  Stop drug abuse.  Avoid taking birth control pills.  Talk to your health care provider about the risks of taking birth control pills if you are over 35 years old, smoke, get migraines, or have ever had a blood clot.  Get evaluated for sleep disorders (sleep apnea).  Talk to your health care provider about getting a sleep evaluation if you snore a lot or have excessive sleepiness.  Take medicines only as directed by your health care provider.  For some people, aspirin or blood thinners (anticoagulants) are helpful in reducing the risk of forming abnormal blood clots that can lead to stroke. If you have the irregular heart rhythm of atrial fibrillation, you should be on a blood thinner unless there is a good reason you cannot take them.  Understand all your medicine instructions.  Make sure that other conditions (such as anemia or atherosclerosis) are addressed. SEEK IMMEDIATE MEDICAL CARE IF:   You have sudden weakness or numbness of the face, arm, or leg, especially on one side of the body.  Your face or eyelid droops to one side.  You have sudden confusion.  You have trouble speaking (aphasia) or understanding.  You have sudden trouble seeing in one or both eyes.  You have sudden trouble walking.  You have dizziness.  You have a loss of balance or coordination.  You have a sudden, severe headache with no known cause.  You have new chest pain or an irregular heartbeat. Any of these symptoms may represent a serious problem that is an emergency. Do not wait to see if the symptoms will   go away. Get medical help at once. Call your local emergency services (911 in [place] Do not drive yourself to the hospital.   This information is not intended to replace advice given to you by your health care provider. Make sure you discuss any questions you have with your health care provider.   Document Released: 03/26/2004 Document Revised: 03/09/2014 Document Reviewed:  08/19/2012 Elsevier Interactive Patient Education 2016 Elsevier Inc.  

## 2015-07-08 NOTE — Progress Notes (Signed)
Chief Complaint: Follow up Extracranial Carotid Artery Stenosis   History of Present Illness  Marcus [name] is a 75 y.o. male patient of Dr. Donnetta Hutching who returns for follow-up of his left carotid endarterectomy on 06/08/2014 for symptomatic carotid disease. His preoperative stroke manifested as expressive aphasia and numbness in left 1st-3rd fingers; sx's lasted about 30 minutes.  He denies any history of hemiparesis or monocular loss of vision. He denies any subsequent stroke or TIA. He denies any cardiac difficulty as well.  He has chronic back pain, has had 3 back surgeries.  Pt denies current dyspnea or cough.  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  Pt Diabetic: no Pt smoker: former smoker, quit about 1990, smoked from age [age]  Pt meds include: Statin : yes ASA: no Other anticoagulants/antiplatelets: Plavix   Past Medical History  Diagnosis Date  . Unspecified vitamin D deficiency   . Urethral stricture unspecified   . Hypertrophy of prostate with urinary obstruction and other lower urinary tract symptoms (LUTS)   . Type II or unspecified type diabetes mellitus without mention of complication, not stated as uncontrolled   . Anxiety state, unspecified   . 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 Urinary frequency   . Insomnia, unspecified   . 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 Calculus of kidney   . Other and unspecified hyperlipidemia   . Reflux esophagitis   . Lumbago   . Unspecified essential hypertension   . Herpes zoster without mention of complication   . Pneumonia     bronchial  . TIA (transient ischemic attack)   . Seasonal allergies   . History of hiatal hernia   . Complication of anesthesia     " I woke up in the recovery room with tube in throat and panicked."  . Stroke [facility])     Social History Social History  Substance Use Topics  . Smoking status: Former Smoker    Types: Cigarettes    Quit  date: 01/01/1991  . Smokeless tobacco: Never Used     Comment: " Quit around 1986"  . Alcohol Use: No    Family History Family History  Problem Relation Age of Onset  . Diabetes Mother   . Cancer Mother   . Hypertension Father   . Stroke Father   . Alcohol abuse Brother     Surgical History Past Surgical History  Procedure Laterality Date  . Spinal fusion  12/91,11/91    obtained bone from left lower leg  . Eye surgery  2014    cataract  . Eye surgery  2013    Retina  . Cardiac catheterization  1986    normal, Dr Lia Foyer  . Stress thallium  1994     normal;  . Stretching bladder  07/2007    Dr Risa Grill  . Endarterectomy Left 06/08/2014    Procedure: LEFT CAROTID ARTERY ENDARTERECTOMY;  Surgeon: Rosetta Posner, MD;  Location: [place];  Service: Vascular;  Laterality: Left;  . Patch angioplasty Left 06/08/2014    Procedure: WITH DACRON PATCH ANGIOPLASTY;  Surgeon: Rosetta Posner, MD;  Location: [place];  Service: Vascular;  Laterality: Left;    Allergies  Allergen Reactions  . Codeine Other (See Comments)    constipation  . Demerol [Meperidine] Other (See Comments)    Drops blood pressure     Current Outpatient Prescriptions  Medication Sig Dispense Refill  . acetaminophen (TYLENOL)  500 MG tablet Take 1,000 mg by mouth every 6 (six) hours as needed for mild pain or moderate pain.    Marland Kitchen atorvastatin (LIPITOR) 40 MG tablet TAKE ONE TABLET BY MOUTH ONCE DAILY FOR CHOLESTEROL 30 tablet 3  . Cholecalciferol (VITAMIN D PO) Take 1 tablet by mouth daily at 12 noon.    . clopidogrel (PLAVIX) 75 MG tablet TAKE 1 TABLET BY MOUTH DAILY 90 tablet 3  . LORazepam (ATIVAN) 0.5 MG tablet TAKE 1 TABLET THREE TIMES A DAY AS NEEDED FOR ANXIETY 90 tablet 0  . nicotine polacrilex (COMMIT) 2 MG lozenge Take 2 mg by mouth 3 (three) times daily.     . timolol (TIMOPTIC) 0.25 % ophthalmic solution Place 1 drop into both eyes 2 (two) times daily.     No current facility-administered medications for this  visit.    Review of Systems : See HPI for pertinent positives and negatives.  Physical Examination  Filed Vitals:   07/08/15 1515  BP: 125/78  Pulse: 61  Temp: 97.6 F (36.4 C)  TempSrc: Oral  Height: 5\' 8"  (1.727 m)  Weight: 202 lb 14.4 oz (92.035 kg)  SpO2: 99%   Body mass index is 30.86 kg/(m^2).  General: WDWN obese male in NAD GAIT: normal Eyes: PERRLA Pulmonary:  Non-labored respirations, CTAB with limited air movement in right posterior fields, no rales,  rhonchi, or wheezing.  Cardiac: regular rhythm, no detected murmur.  VASCULAR EXAM Carotid Bruits Right Left   Negative Negative    Aorta is not palpable. Radial pulses are 2+ palpable and equal.                                                                                                                            LE Pulses Right Left       POPLITEAL  not palpable   not palpable       POSTERIOR TIBIAL   palpable    palpable        DORSALIS PEDIS      ANTERIOR TIBIAL  palpable   palpable     Gastrointestinal: soft, nontender, BS WNL, no r/g, no palpable masses.  Musculoskeletal: no muscle atrophy/wasting. M/S 5/5 throughout, extremities without ischemic changes.  Neurologic: A&O X 3; Appropriate Affect, Speech is normal CN 2-12 intact, pain and light touch intact in extremities, Motor exam as listed above.   Non-Invasive Vascular Imaging CAROTID DUPLEX 07/08/2015   CEREBROVASCULAR DUPLEX EVALUATION    INDICATION: Carotid artery disease    PREVIOUS INTERVENTION(S): Left carotid endarterectomy 06/08/2014    DUPLEX EXAM: Carotid duplex    RIGHT  LEFT  Peak Systolic Velocities (cm/s) End Diastolic Velocities (cm/s) Plaque LOCATION Peak Systolic Velocities (cm/s) End Diastolic Velocities (cm/s) Plaque  103 25 - CCA PROXIMAL 77 26 -  125 38 - CCA MID 78 27 -  129 36 HT CCA DISTAL 73 30 HM  88 14 - ECA 124 23 HT  80 23 HT  ICA PROXIMAL 62 21 -  52 17 HT ICA MID 61 23 HM  60 24 - ICA DISTAL 75 32  -    0.6 ICA / CCA Ratio (PSV) NA  Antegrade Vertebral Flow Antegrade   Brachial Systolic Pressure (mmHg) -  Triphasic Brachial Artery Waveforms Triphasic    Plaque Morphology:  HM = Homogeneous, HT = Heterogeneous, CP = Calcific Plaque, SP = Smooth Plaque, IP = Irregular Plaque     ADDITIONAL FINDINGS: Right subclavian artery 128 cm/s, left subclavian artery 159 cm/s, normal waveforms    IMPRESSION: 1. Less than 40% right internal carotid artery stenosis. 2. Patent left carotid endarterectomy site with no evidence for restenosis. 3. Left external carotid artery stenosis.    Compared to the previous exam:  No significant changes noted compared to previous exam of 01/01/2015.     Assessment: Marcus [name] is a 75 y.o. male who is s/p left carotid endarterectomy on 06/08/2014 for symptomatic carotid artery disease. His preoperative stroke manifested as expressive aphasia and numbness in left 1st-3rd fingers; sx's lasted about 30 minutes.  He denies any subsequent stroke or TIA. Today's carotid duplex suggests less than 40% right internal carotid artery stenosis. Patent left carotid endarterectomy site with no evidence for restenosis. No significant changes noted compared to previous exam of 01/01/2015.    Plan: Follow-up in 1 year with Carotid Duplex scan.   I discussed in depth with the patient the nature of atherosclerosis, and emphasized the importance of maximal medical management including strict control of blood pressure, blood glucose, and lipid levels, obtaining regular exercise, and continued cessation of smoking.  The patient is aware that without maximal medical management the underlying atherosclerotic disease process will progress, limiting the benefit of any interventions. The patient was given information about stroke prevention and what symptoms should prompt the patient to seek immediate medical care. Thank you for allowing [place] to participate in this patient's  care.  Clemon Chambers, RN, MSN, FNP-C [facility] Office: [facility] Physician: Trula Slade  07/08/2015 3:53 PM

## 2015-07-16 ENCOUNTER — Other Ambulatory Visit: Payer: Self-pay | Admitting: Internal Medicine

## 2015-07-19 ENCOUNTER — Other Ambulatory Visit: Payer: Self-pay | Admitting: Internal Medicine

## 2015-08-15 NOTE — Addendum Note (Signed)
Addended by: Mena Goes on: 08/15/2015 04:58 PM   Modules accepted: Orders

## 2015-08-26 ENCOUNTER — Other Ambulatory Visit: Payer: Self-pay | Admitting: Internal Medicine

## 2015-09-10 ENCOUNTER — Telehealth: Payer: Self-pay | Admitting: *Deleted

## 2015-09-10 NOTE — Telephone Encounter (Signed)
Pt is on the lab schedule for 09/16/15 and there are no labs ordered, per last OV return in 3 mths. Per last lab results, everything normal. Please advise if pt should be getting labs.

## 2015-09-10 NOTE — Telephone Encounter (Signed)
Schedule for urine microalbumin, and globin A1c, CMP.

## 2015-09-16 ENCOUNTER — Other Ambulatory Visit: Payer: Medicare Other

## 2015-09-16 DIAGNOSIS — E1159 Type 2 diabetes mellitus with other circulatory complications: Secondary | ICD-10-CM

## 2015-09-16 DIAGNOSIS — E785 Hyperlipidemia, unspecified: Secondary | ICD-10-CM

## 2015-09-16 LAB — HEMOGLOBIN A1C
HEMOGLOBIN A1C: 5.8 % — AB (ref <5.7)
MEAN PLASMA GLUCOSE: 120 mg/dL

## 2015-09-16 NOTE — Telephone Encounter (Signed)
Lab order entered by Jackelyn Poling

## 2015-09-17 LAB — LIPID PANEL
CHOL/HDL RATIO: 3.4 ratio (ref <=5.0)
Cholesterol: 101 mg/dL — ABNORMAL LOW (ref 125–200)
HDL: 30 mg/dL — ABNORMAL LOW (ref >=40)
LDL CALC: 57 mg/dL (ref <130)
Triglycerides: 69 mg/dL (ref <150)
VLDL: 14 mg/dL (ref <30)

## 2015-09-17 LAB — COMPLETE METABOLIC PANEL WITH GFR
ALBUMIN: 3.5 g/dL — AB (ref 3.6–5.1)
ALK PHOS: 130 U/L — AB (ref 40–115)
ALT: 18 U/L (ref 9–46)
AST: 21 U/L (ref 10–35)
BUN: 24 mg/dL (ref 7–25)
CO2: 27 mmol/L (ref 20–31)
Calcium: 8.5 mg/dL — ABNORMAL LOW (ref 8.6–10.3)
Chloride: 107 mmol/L (ref 98–110)
Creat: 1.22 mg/dL — ABNORMAL HIGH (ref 0.70–1.18)
GFR, Est African American: 68 mL/min (ref >=60)
GFR, Est Non African American: 59 mL/min — ABNORMAL LOW (ref >=60)
GLUCOSE: 94 mg/dL (ref 65–99)
POTASSIUM: 4.1 mmol/L (ref 3.5–5.3)
SODIUM: 139 mmol/L (ref 135–146)
Total Bilirubin: 0.4 mg/dL (ref 0.2–1.2)
Total Protein: 6.4 g/dL (ref 6.1–8.1)

## 2015-09-17 LAB — MICROALBUMIN / CREATININE URINE RATIO
Creatinine, Urine: 136 mg/dL (ref 20–370)
MICROALB/CREAT RATIO: 35 ug/mg{creat} — AB (ref <30)
Microalb, Ur: 4.7 mg/dL

## 2015-09-18 ENCOUNTER — Encounter: Payer: Self-pay | Admitting: Internal Medicine

## 2015-09-18 ENCOUNTER — Ambulatory Visit (INDEPENDENT_AMBULATORY_CARE_PROVIDER_SITE_OTHER): Payer: Medicare Other | Admitting: Internal Medicine

## 2015-09-18 VITALS — BP 110/72 | HR 62 | Temp 97.7°F | Ht 68.0 in | Wt 202.0 lb

## 2015-09-18 DIAGNOSIS — H409 Unspecified glaucoma: Secondary | ICD-10-CM | POA: Diagnosis not present

## 2015-09-18 DIAGNOSIS — R748 Abnormal levels of other serum enzymes: Secondary | ICD-10-CM

## 2015-09-18 DIAGNOSIS — I10 Essential (primary) hypertension: Secondary | ICD-10-CM | POA: Diagnosis not present

## 2015-09-18 DIAGNOSIS — I6522 Occlusion and stenosis of left carotid artery: Secondary | ICD-10-CM

## 2015-09-18 DIAGNOSIS — E1159 Type 2 diabetes mellitus with other circulatory complications: Secondary | ICD-10-CM

## 2015-09-18 DIAGNOSIS — E785 Hyperlipidemia, unspecified: Secondary | ICD-10-CM

## 2015-09-18 DIAGNOSIS — G250 Essential tremor: Secondary | ICD-10-CM

## 2015-09-18 NOTE — Progress Notes (Signed)
Patient ID: Marcus [name], male   DOB: 1940/08/20, 75 y.o.   MRN: [id number]    HISTORY AND PHYSICAL  Location:    [place]   Place of Service:   OFFICE  Extended Emergency Contact Information Primary Emergency Contact: [facility] Address: [address]          [place], [place] [address] Johnnette Litter of [place] Phone: [phone] Work Phone: [phone] Relation: Spouse Secondary Emergency Contact: Daisy Floro, [place] [address] [place] Phone: [phone] Relation: Daughter  Advanced Directive information Does patient have an advance directive?: Yes, Type of Advance Directive: Living will  Chief Complaint  Patient presents with  . Medical Management of Chronic Issues    6 month medication management blood pressure, cholesterol, blood sugar, anxiety.    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 Last Doppler in June 2017.  Elevated alkaline phosphatase level - chronic and stable  Benign essential tremor - unchanged. Does not interfere with usual activity  Glaucoma - followed regularly by ophth, Dr. Darene Lamer. [place]. 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    Past Medical History  Diagnosis Date  . Unspecified vitamin D deficiency   . Urethral stricture unspecified   . Hypertrophy of prostate with urinary obstruction and other lower urinary tract symptoms (LUTS)   . Type II or unspecified type diabetes mellitus without mention of complication, not stated as uncontrolled   . Anxiety state, unspecified   . 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 Urinary frequency   . Insomnia, unspecified   . 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 Calculus of kidney   . Other and unspecified hyperlipidemia   . Reflux esophagitis   . Lumbago   . Unspecified essential  hypertension   . Herpes zoster without mention of complication   . Pneumonia     bronchial  . TIA (transient ischemic attack)   . Seasonal allergies   . History of hiatal hernia   . Complication of anesthesia     " I woke up in the recovery room with tube in throat and panicked."  . Stroke [facility])     Past Surgical History  Procedure Laterality Date  . Spinal fusion  12/91,11/91    obtained bone from left lower leg  . Eye surgery  2014    cataract  . Eye surgery  2013    Retina  . Cardiac catheterization  1986    normal, Dr Lia Foyer  . Stress thallium  1994     normal;  . Stretching bladder  07/2007    Dr Risa Grill  . Endarterectomy Left 06/08/2014    Procedure: LEFT CAROTID ARTERY ENDARTERECTOMY;  Surgeon: Rosetta Posner, MD;  Location: [place];  Service: Vascular;  Laterality: Left;  . Patch angioplasty Left 06/08/2014    Procedure: WITH DACRON PATCH ANGIOPLASTY;  Surgeon: Rosetta Posner, MD;  Location: [place];  Service: Vascular;  Laterality: Left;    Patient Care Team: Estill Dooms, MD as PCP - General (Internal Medicine) Rana Snare, MD as Consulting Physician (Urology) Darleen Crocker, MD as Consulting Physician (Ophthalmology) Rosalin Hawking, MD as Consulting Physician (Neurology)  Social History   Social History  . Marital Status: Married    Spouse Name: N/A  . Number of Children:  N/A  . Years of Education: N/A   Occupational History  . Not on file.   Social History Main Topics  . Smoking status: Former Smoker    Types: Cigarettes    Quit date: 01/01/1991  . Smokeless tobacco: Never Used     Comment: " Quit around 1986"  . Alcohol Use: No  . Drug Use: No  . Sexual Activity: Yes    Birth Control/ Protection: None   Other Topics Concern  . Not on file   Social History Narrative    reports that he quit smoking about 24 years ago. His smoking use included Cigarettes. He has never used smokeless tobacco. He reports that he does not drink alcohol or use illicit  drugs.  Family History  Problem Relation Age of Onset  . Diabetes Mother   . Cancer Mother   . Hypertension Father   . Stroke Father   . Alcohol abuse Brother    Family Status  Relation Status Death Age  . Mother Deceased   . Father Deceased   . Brother Deceased   . Sister Alive   . Sister Alive   . Daughter Alive   . Son Alive   . Daughter Alive   . Daughter Alive     Immunization History  Administered Date(s) Administered  . DTaP 04/30/2009, 10/15/2012  . Influenza,inj,Quad PF,36+ Mos 02/01/2013, 11/07/2014  . Influenza-Unspecified 01/02/2009, 12/01/2011, 01/17/2014  . Pneumococcal Conjugate-13 11/07/2014  . Pneumococcal Polysaccharide-23 02/01/2013    Allergies  Allergen Reactions  . Codeine Other (See Comments)    constipation  . Demerol [Meperidine] Other (See Comments)    Drops blood pressure     Medications: Patient's Medications  New Prescriptions   No medications on file  Previous Medications   ACETAMINOPHEN (TYLENOL) 500 MG TABLET    Take 1,000 mg by mouth every 6 (six) hours as needed for mild pain or moderate pain.   ATORVASTATIN (LIPITOR) 40 MG TABLET    TAKE ONE TABLET BY MOUTH ONCE DAILY FOR CHOLESTEROL   CHOLECALCIFEROL (VITAMIN D PO)    Take 1 tablet by mouth daily at 12 noon.   CLOPIDOGREL (PLAVIX) 75 MG TABLET    TAKE 1 TABLET BY MOUTH DAILY   LORAZEPAM (ATIVAN) 0.5 MG TABLET    TAKE 1 TABLET THREE TIMES A DAY AS NEEDED   NICOTINE POLACRILEX (COMMIT) 2 MG LOZENGE    Take 2 mg by mouth 3 (three) times daily.    TIMOLOL (TIMOPTIC) 0.25 % OPHTHALMIC SOLUTION    Place 1 drop into both eyes 2 (two) times daily.  Modified Medications   No medications on file  Discontinued Medications   No medications on file    Review of Systems  Constitutional: Negative for fever, activity change, appetite change, fatigue and unexpected weight change.  HENT: Negative for congestion, ear pain, hearing loss, rhinorrhea, sinus pressure, sore throat, tinnitus,  trouble swallowing and voice change.   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  Respiratory: Negative for cough, choking, chest tightness, shortness of breath and wheezing.   Cardiovascular: Negative.  Negative for chest pain, palpitations and leg swelling.  Gastrointestinal: Positive for abdominal pain and constipation. Negative for nausea, diarrhea and abdominal distention.  Endocrine: Negative for cold intolerance, heat intolerance, polydipsia, polyphagia and polyuria.       History of mild elevations in glucose  Genitourinary: Positive for frequency. Negative for dysuria, urgency and testicular pain.  Not incontinent  Musculoskeletal: Positive for back pain. Negative for myalgias, arthralgias, gait problem and neck pain.  Skin: Negative.  Negative for color change, pallor and rash.  Allergic/Immunologic: Negative.   Neurological: Negative for dizziness, tremors, syncope, speech difficulty, weakness, numbness and headaches.       TIA 06/01/2014. Left carotid endarterectomy 06/08/14.  Hematological: Negative.  Negative for adenopathy. Does not bruise/bleed easily.  Psychiatric/Behavioral: Negative for hallucinations, behavioral problems, confusion, sleep disturbance, decreased concentration and agitation. The patient is not nervous/anxious.     Filed Vitals:   09/18/15 1606  BP: 110/72  Pulse: 62  Temp: 97.7 F (36.5 C)  TempSrc: Oral  Height: 5' 8"  (1.727 m)  Weight: 202 lb (91.627 kg)  SpO2: 97%   Body mass index is 30.72 kg/(m^2). Filed Weights   09/18/15 1606  Weight: 202 lb (91.627 kg)     Physical Exam  Constitutional: He is oriented to person, place, and time. He appears well-developed and well-nourished. No distress.  HENT:  Head: Atraumatic.  Right Ear: External ear normal.  Left Ear: External ear normal.  Nose: Nose normal.  Mouth/Throat: No oropharyngeal exudate.  Eyes: Conjunctivae and EOM are normal. Pupils are equal, round, and reactive to  light.  Neck: No JVD present. No tracheal deviation present. No thyromegaly present.  Surgical incision from left endarterectomy is healed. No bruit.  Cardiovascular: Normal rate, regular rhythm, normal heart sounds and intact distal pulses.  Exam reveals no gallop and no friction rub.   No murmur heard. Pulmonary/Chest: No respiratory distress. He has no wheezes. He has no rales.  Abdominal: He exhibits no distension and no mass. There is no tenderness.  Genitourinary: Rectum normal and prostate normal. Guaiac negative stool.  Musculoskeletal: Normal range of motion. He exhibits no edema or tenderness.  Improvement in lower back pain. Cannot remain in one position long. Has to move. Mildly tender to percussion in the lower back. Walks bent forward.  Lymphadenopathy:    He has no cervical adenopathy.  Neurological: He is alert and oriented to person, place, and time. He displays abnormal reflex. No cranial nerve deficit. Coordination normal.  Absent patellar deep tendon reflex bilaterally. Normal sensations to vibration and monofilament testing.  Skin: No rash noted. No erythema. No pallor.  Psychiatric: He has a normal mood and affect. His behavior is normal. Judgment and thought content normal.    Labs reviewed: Lab Summary Latest Ref Rng 09/16/2015 06/21/2015 05/30/2015  Hemoglobin 12.6 - 17.7 g/dL (None) (None) 15.0  Hematocrit 37.5 - 51.0 % (None) (None) 44.3  White count 3.4 - 10.8 x10E3/uL (None) (None) 3.7  Platelet count 150 - 379 x10E3/uL (None) (None) 207  Sodium 135 - 146 mmol/L 139 139 (None)  Potassium 3.5 - 5.3 mmol/L 4.1 4.5 (None)  Calcium 8.6 - 10.3 mg/dL 8.5(L) 9.2 (None)  Phosphorus - (None) (None) (None)  Creatinine 0.70 - 1.18 mg/dL 1.22(H) 1.08 (None)  AST 10 - 35 U/L 21 22 (None)  Alk Phos 40 - 115 U/L 130(H) 135(H) (None)  Bilirubin 0.2 - 1.2 mg/dL 0.4 0.3 (None)  Glucose 65 - 99 mg/dL 94 106(H) (None)  Cholesterol 125 - 200 mg/dL 101(L) (None) (None)  HDL  cholesterol >=40 mg/dL 30(L) 30(L) (None)  Triglycerides <150 mg/dL 69 71 (None)  LDL Direct - (None) (None) (None)  LDL Calc <130 mg/dL 57 67 (None)  Total protein 6.1 - 8.1 g/dL 6.4 (None) (None)  Albumin 3.6 - 5.1 g/dL 3.5(L) 3.9 (None)   Lab Results  Component Value Date   BUN 24 09/16/2015   Lab Results  Component Value Date   HGBA1C 5.8* 09/16/2015   Lab Results  Component Value Date   TSH 1.660 05/30/2015    Assessment/Plan  1. Type 2 diabetes mellitus with other circulatory complications (HCC) - Comprehensive metabolic panel; Future  2. HLD (hyperlipidemia) - Lipid panel; Future  3. Essential hypertension controlled  4. Carotid stenosis, left stable  5. Elevated alkaline phosphatase level stable  6. Benign essential tremor unchanged  7. Glaucoma Continue monitoring through ophth.

## 2015-10-06 ENCOUNTER — Other Ambulatory Visit: Payer: Self-pay | Admitting: Internal Medicine

## 2015-11-11 ENCOUNTER — Telehealth: Payer: Self-pay | Admitting: *Deleted

## 2015-11-11 NOTE — Telephone Encounter (Signed)
Come at 3:00 PM if still in pain.

## 2015-11-11 NOTE — Telephone Encounter (Signed)
Patient wife, Jackelyn Poling called and stated that patient saw Dr. Risa Grill today and he gave him a medication and told him if he was no better by in the morning to call his PCP. Was in a lot of pain this afternoon so they took him to see Dr. Risa Grill because they thought it was his kidneys. They put him on an antibiotic. Did a sonogram this afternoon and everything was clear, had chills and a temp of 100. Patient has started the antibiotic but was wondering if they could see you tomorrow if he is still in pain. Please Advise.

## 2015-11-12 ENCOUNTER — Encounter: Payer: Self-pay | Admitting: Internal Medicine

## 2015-11-12 ENCOUNTER — Ambulatory Visit (INDEPENDENT_AMBULATORY_CARE_PROVIDER_SITE_OTHER): Payer: Medicare Other | Admitting: Internal Medicine

## 2015-11-12 ENCOUNTER — Ambulatory Visit
Admission: RE | Admit: 2015-11-12 | Discharge: 2015-11-12 | Disposition: A | Discharge status: Home / Self Care | Payer: Medicare Other | Source: Ambulatory Visit | Attending: Internal Medicine | Admitting: Internal Medicine

## 2015-11-12 DIAGNOSIS — R0602 Shortness of breath: Secondary | ICD-10-CM | POA: Insufficient documentation

## 2015-11-12 DIAGNOSIS — R079 Chest pain, unspecified: Secondary | ICD-10-CM

## 2015-11-12 DIAGNOSIS — R0600 Dyspnea, unspecified: Secondary | ICD-10-CM

## 2015-11-12 NOTE — Telephone Encounter (Signed)
Patient would like to see Dr. Nyoka Cowden at 3 today. Added to schedule.

## 2015-11-12 NOTE — Progress Notes (Signed)
Facility  [facility]    Place of Service:   OFFICE    Allergies  Allergen Reactions  . Codeine Other (See Comments)    constipation  . Demerol [Meperidine] Other (See Comments)    Drops blood pressure     Chief Complaint  Patient presents with  . Acute Visit    pain right lower abdomin up to chest through to back, hurts to breath. Tight in chest walking up steps, Temp 100 yesterday, chills, 11/11/15 Urologist gave him Sulf TMP bid for UTI. Here with wife    HPI:  Seen by Dr. Risa Grill, urologist, 11/11/15. UA was nitirite positive and had >60 WBC/hpf. Currently on Septra DS.  Pain in the right chest. Some wheezes. Splinting respirations. Tender in right costovertebral angle. Temp 100.0  Some loose stools last night.  Medications: Patient's Medications  New Prescriptions   No medications on file  Previous Medications   ACETAMINOPHEN (TYLENOL) 500 MG TABLET    Take 1,000 mg by mouth every 6 (six) hours as needed for mild pain or moderate pain.   ATORVASTATIN (LIPITOR) 40 MG TABLET    TAKE ONE TABLET BY MOUTH ONCE DAILY FOR CHOLESTEROL   CHOLECALCIFEROL (VITAMIN D PO)    Take 1 tablet by mouth daily at 12 noon.   CLOPIDOGREL (PLAVIX) 75 MG TABLET    TAKE 1 TABLET BY MOUTH DAILY   LORAZEPAM (ATIVAN) 0.5 MG TABLET    TAKE 1 TABLET THREE TIMES DAILY AS NEEDED.   NICOTINE POLACRILEX (COMMIT) 2 MG LOZENGE    Take 2 mg by mouth 3 (three) times daily.    SULFAMETHOXAZOLE-TRIMETHOPRIM (BACTRIM DS,SEPTRA DS) 800-160 MG TABLET    One tablet daily for UTI   TIMOLOL (TIMOPTIC) 0.25 % OPHTHALMIC SOLUTION    Place 1 drop into both eyes 2 (two) times daily.  Modified Medications   No medications on file  Discontinued Medications   No medications on file    Review of Systems  Constitutional: Negative for activity change, appetite change, fatigue, fever and unexpected weight change.  HENT: Negative for congestion, ear pain, hearing loss, rhinorrhea, sinus pressure, sore throat, tinnitus,  trouble swallowing and voice change.   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  Respiratory: Negative for cough, choking, chest tightness, shortness of breath and wheezing.   Cardiovascular: Negative.  Negative for chest pain, palpitations and leg swelling.  Gastrointestinal: Positive for abdominal pain and constipation. Negative for abdominal distention, diarrhea and nausea.  Endocrine: Negative for cold intolerance, heat intolerance, polydipsia, polyphagia and polyuria.       History of mild elevations in glucose  Genitourinary: Positive for frequency. Negative for dysuria, testicular pain and urgency.       Not incontinent  Musculoskeletal: Positive for back pain. Negative for arthralgias, gait problem, myalgias and neck pain.  Skin: Negative.  Negative for color change, pallor and rash.  Allergic/Immunologic: Negative.   Neurological: Negative for dizziness, tremors, syncope, speech difficulty, weakness, numbness and headaches.       TIA 06/01/2014. Left carotid endarterectomy 06/08/14.  Hematological: Negative.  Negative for adenopathy. Does not bruise/bleed easily.  Psychiatric/Behavioral: Negative for agitation, behavioral problems, confusion, decreased concentration, hallucinations and sleep disturbance. The patient is not nervous/anxious.     Vitals:   11/12/15 1604  BP: 120/74  Pulse: 76  Temp: 98.5 F (36.9 C)  TempSrc: Oral  SpO2: 98%  Weight: 206 lb (93.4 kg)   Body mass index is 31.32  kg/m. Wt Readings from Last 3 Encounters:  11/12/15 206 lb (93.4 kg)  09/18/15 202 lb (91.6 kg)  07/08/15 202 lb 14.4 oz (92 kg)      Physical Exam  Constitutional: He is oriented to person, place, and time. He appears well-developed and well-nourished. No distress.  HENT:  Head: Atraumatic.  Right Ear: External ear normal.  Left Ear: External ear normal.  Nose: Nose normal.  Mouth/Throat: No oropharyngeal exudate.  Eyes: Conjunctivae and EOM are normal. Pupils are  equal, round, and reactive to light.  Neck: No JVD present. No tracheal deviation present. No thyromegaly present.  Surgical incision from left endarterectomy is healed. No bruit.  Cardiovascular: Normal rate, regular rhythm, normal heart sounds and intact distal pulses.  Exam reveals no gallop and no friction rub.   No murmur heard. Pulmonary/Chest: No respiratory distress. He has no wheezes. He has no rales.  Bronchial rattle bilaterally. Slight wheeze intermittently. Splinting respirations.  Abdominal: He exhibits no distension and no mass. There is no tenderness.  Genitourinary: Rectum normal and prostate normal. Rectal exam shows guaiac negative stool.  Musculoskeletal: Normal range of motion. He exhibits no edema or tenderness.  Improvement in lower back pain. Cannot remain in one position long. Has to move. Mildly tender to percussion in the lower back. Walks bent forward.  Lymphadenopathy:    He has no cervical adenopathy.  Neurological: He is alert and oriented to person, place, and time. He displays abnormal reflex. No cranial nerve deficit. Coordination normal.  Absent patellar deep tendon reflex bilaterally. Normal sensations to vibration and monofilament testing.  Skin: No rash noted. No erythema. No pallor.  Psychiatric: He has a normal mood and affect. His behavior is normal. Judgment and thought content normal.    Labs reviewed: Lab Summary Latest Ref Rng & Units 09/16/2015 06/21/2015 05/30/2015  Hemoglobin 12.6 - 17.7 g/dL (None) (None) 15.0  Hematocrit 37.5 - 51.0 % (None) (None) 44.3  White count 3.4 - 10.8 x10E3/uL (None) (None) 3.7  Platelet count 150 - 379 x10E3/uL (None) (None) 207  Sodium 135 - 146 mmol/L 139 139 (None)  Potassium 3.5 - 5.3 mmol/L 4.1 4.5 (None)  Calcium 8.6 - 10.3 mg/dL 8.5(L) 9.2 (None)  Phosphorus - (None) (None) (None)  Creatinine 0.70 - 1.18 mg/dL 1.22(H) 1.08 (None)  AST 10 - 35 U/L 21 22 (None)  Alk Phos 40 - 115 U/L 130(H) 135(H) (None)    Bilirubin 0.2 - 1.2 mg/dL 0.4 0.3 (None)  Glucose 65 - 99 mg/dL 94 106(H) (None)  Cholesterol 125 - 200 mg/dL 101(L) (None) (None)  HDL cholesterol >=40 mg/dL 30(L) 30(L) (None)  Triglycerides <150 mg/dL 69 71 (None)  LDL Direct - (None) (None) (None)  LDL Calc <130 mg/dL 57 67 (None)  Total protein 6.1 - 8.1 g/dL 6.4 (None) (None)  Albumin 3.6 - 5.1 g/dL 3.5(L) 3.9 (None)  Some recent data might be hidden   Lab Results  Component Value Date   TSH 1.660 05/30/2015   TSH 1.620 01/15/2014   TSH 1.150 11/08/2013   Lab Results  Component Value Date   BUN 24 09/16/2015   BUN 20 06/21/2015   BUN 18 03/15/2015   Lab Results  Component Value Date   HGBA1C 5.8 (H) 09/16/2015   HGBA1C 5.8 (H) 06/21/2015   HGBA1C 5.7 (H) 03/15/2015   11/12/15 EKG: rate 82. NSR. Normal  Assessment/Plan  1. Dyspnea -Aleve bid - DG Chest 2 View; Future  2. Chest pain, unspecified chest pain type  Aleve bid Continue Septra DS - DG Chest 2 View; Future - EKG 12-Lead

## 2015-11-19 ENCOUNTER — Other Ambulatory Visit: Payer: Self-pay | Admitting: Internal Medicine

## 2015-11-20 ENCOUNTER — Other Ambulatory Visit: Payer: Self-pay | Admitting: Internal Medicine

## 2015-11-28 ENCOUNTER — Ambulatory Visit (INDEPENDENT_AMBULATORY_CARE_PROVIDER_SITE_OTHER): Payer: Medicare Other

## 2015-11-28 ENCOUNTER — Encounter (HOSPITAL_COMMUNITY): Payer: Self-pay | Admitting: Emergency Medicine

## 2015-11-28 ENCOUNTER — Ambulatory Visit (HOSPITAL_COMMUNITY)
Admission: EM | Admit: 2015-11-28 | Discharge: 2015-11-28 | Disposition: A | Discharge status: Home / Self Care | Payer: Medicare Other | Attending: Emergency Medicine | Admitting: Emergency Medicine

## 2015-11-28 DIAGNOSIS — M62830 Muscle spasm of back: Secondary | ICD-10-CM | POA: Diagnosis not present

## 2015-11-28 DIAGNOSIS — S299XXA Unspecified injury of thorax, initial encounter: Secondary | ICD-10-CM | POA: Diagnosis not present

## 2015-11-28 MED ORDER — HYDROCODONE-ACETAMINOPHEN 5-325 MG PO TABS
ORAL_TABLET | ORAL | Status: AC
  Filled 2015-11-28: qty 1

## 2015-11-28 MED ORDER — METHOCARBAMOL 500 MG PO TABS: 500.0000 mg | ORAL_TABLET | Freq: Four times a day (QID) | ORAL | 0 refills | Status: DC | PRN

## 2015-11-28 MED ORDER — HYDROCODONE-ACETAMINOPHEN 5-325 MG PO TABS
1.0000 | ORAL_TABLET | Freq: Once | ORAL | Status: AC
  Administered 2015-11-28: 1 via ORAL

## 2015-11-28 MED ORDER — HYDROCODONE-ACETAMINOPHEN 5-325 MG PO TABS: 2.0000 | ORAL_TABLET | Freq: Four times a day (QID) | ORAL | 0 refills | Status: DC | PRN

## 2015-11-28 MED ORDER — HYDROCODONE-ACETAMINOPHEN 5-325 MG PO TABS: 1.0000 | ORAL_TABLET | Freq: Four times a day (QID) | ORAL | 0 refills | Status: DC | PRN

## 2015-11-28 NOTE — ED Triage Notes (Signed)
Patient has pain above mid-back.  Patient reports this started after having a ct scan 9/19.  Patient had an episode of vomiting while in scanner, patient was trying to raise up and vomiting at the same time while in the tube.  Patient denies cough, but has significant pain in back to touch, movement, palpation

## 2015-11-28 NOTE — Discharge Instructions (Signed)
Be careful with the Ativan, Norco and the Robaxin. I would suggest that you do not take the Ativan at all while you're taking these medicines. Combination of  these medicines can increase your risk of falls, but it can also sedate you so much that it slows your breathing down to dangerous levels.

## 2015-11-28 NOTE — ED Provider Notes (Signed)
HPI  SUBJECTIVE:  Marcus [name] is a 75 y.o. male who presents with 9 days of sharp, constant upper posterior left sided back pain starting after he sat up while in the CT scanner, and vomited. He states that he laid down hard after this. He states that the pain feels like a muscle spasm. Has tried massage therapy, Tylenol, heat, tramadol which he states isn't helping. Symptoms better with bending forward, worse with deep inspiration, going from sitting to standing, use of his left arm and lying down flat. Denies fevers, coughing, wheezing, hemoptysis, shortness of breath.  No record of a chest abdomen pelvis CT being done on 9/19. Patient states that the  CT was done at an outside facility.  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 No history of kidney disease or osteoporosis. PMD: Dr. Nyoka Cowden.   Past Medical History:  Diagnosis Date  . Anxiety state, unspecified   . Calculus of kidney   . 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 Complication of anesthesia    " I woke up in the recovery room with tube in throat and panicked."  . 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 Herpes zoster without mention of complication   . History of hiatal hernia   . Hypertrophy of prostate with urinary obstruction and other lower urinary tract symptoms (LUTS)   . Insomnia, unspecified   . Lumbago   . Other and unspecified hyperlipidemia   . Pneumonia    bronchial  . Reflux esophagitis   . Seasonal allergies   . Stroke ([place])   . TIA (transient ischemic attack)   . Type II or unspecified type diabetes mellitus without mention of complication, not stated as uncontrolled   . Unspecified essential hypertension   . Unspecified vitamin D deficiency   . Urethral stricture unspecified   . Urinary frequency     Past Surgical History:  Procedure Laterality Date  .  CARDIAC CATHETERIZATION  1986   normal, Dr Lia Foyer  . ENDARTERECTOMY Left 06/08/2014   Procedure: LEFT CAROTID ARTERY ENDARTERECTOMY;  Surgeon: Rosetta Posner, MD;  Location: [facility];  Service: Vascular;  Laterality: Left;  . EYE SURGERY  2014   cataract  . EYE SURGERY  2013   Retina  . PATCH ANGIOPLASTY Left 06/08/2014   Procedure: WITH DACRON PATCH ANGIOPLASTY;  Surgeon: Rosetta Posner, MD;  Location: [facility];  Service: Vascular;  Laterality: Left;  . SPINAL FUSION  12/91,11/91   obtained bone from left lower leg  . stress thallium  1994    normal;  . stretching bladder  07/2007   Dr Risa Grill    Family History  Problem Relation Age of Onset  . Diabetes Mother   . Cancer Mother   . Hypertension Father   . Stroke Father   . Alcohol abuse Brother     Social History  Substance Use Topics  . Smoking status: Former Smoker    Types: Cigarettes    Quit date: 01/01/1991  . Smokeless tobacco: Never Used     Comment: " Quit around 1986"  . Alcohol use No    No current facility-administered medications for this encounter.   Current Outpatient Prescriptions:  .  acetaminophen (TYLENOL) 500 MG tablet, Take 1,000 mg by mouth every 6 (six) hours as needed for mild pain or moderate pain., Disp: , Rfl:  .  atorvastatin (LIPITOR)  40 MG tablet, TAKE ONE TABLET BY MOUTH ONCE DAILY FOR CHOLESTEROL, Disp: 30 tablet, Rfl: 3 .  Cholecalciferol (VITAMIN D PO), Take 1 tablet by mouth daily at 12 noon., Disp: , Rfl:  .  clopidogrel (PLAVIX) 75 MG tablet, TAKE 1 TABLET BY MOUTH DAILY, Disp: 90 tablet, Rfl: 3 .  HYDROcodone-acetaminophen (NORCO/VICODIN) 5-325 MG tablet, Take 2 tablets by mouth every 6 (six) hours as needed for moderate pain., Disp: 20 tablet, Rfl: 0 .  methocarbamol (ROBAXIN) 500 MG tablet, Take 1 tablet (500 mg total) by mouth every 6 (six) hours as needed for muscle spasms., Disp: 20 tablet, Rfl: 0 .  nicotine polacrilex (COMMIT) 2 MG lozenge, Take 2 mg by mouth 3 (three) times daily. , Disp: ,  Rfl:  .  sulfamethoxazole-trimethoprim (BACTRIM DS,SEPTRA DS) 800-160 MG tablet, One tablet daily for UTI, Disp: , Rfl: 0 .  timolol (TIMOPTIC) 0.25 % ophthalmic solution, Place 1 drop into both eyes 2 (two) times daily., Disp: , Rfl:   Allergies  Allergen Reactions  . Codeine Other (See Comments)    constipation  . Demerol [Meperidine] Other (See Comments)    Drops blood pressure      ROS  As noted in HPI.   Physical Exam  BP 131/83 (BP Location: Right Arm)   Pulse 72   Temp 98.3 F (36.8 C) (Oral)   Resp 12   SpO2 98%   Constitutional: Well developed, well nourished, Appears uncomfortable Eyes:  EOMI, conjunctiva normal bilaterally HENT: Normocephalic, atraumatic,mucus membranes moist Respiratory: Decreased inspiratory effort secondary to pain. Positive diffuse posterior thoracic tenderness over the ribs in between the scapula and the spine. Positive muscle spasm over the trapezius. No bruising. No crepitus. No point tenderness. No other chest wall tenderness. Cardiovascular: Normal rate regular rhythm no murmurs rubs or gallops GI: nondistended skin: No rash, skin intact Musculoskeletal: no deformities Neurologic: Alert & oriented x 3, no focal neuro deficits Psychiatric: Speech and behavior appropriate   ED Course   Medications  HYDROcodone-acetaminophen (NORCO/VICODIN) 5-325 MG per tablet 1 tablet (1 tablet Oral Given 11/28/15 1537)    Orders Placed This Encounter  Procedures  . DG Ribs Unilateral W/Chest Left    Standing Status:   Standing    Number of Occurrences:   1    Order Specific Question:   Reason for Exam (SYMPTOM  OR DIAGNOSIS REQUIRED)    Answer:   posterior thoracic tenderness over ribs r/o fx, PNA    No results found for this or any previous visit (from the past 24 hour(s)). Dg Ribs Unilateral W/chest Left  Result Date: 11/28/2015 CLINICAL DATA:  Posterior thoracic tenderness over the ribs. Evaluate for fracture. EXAM: LEFT RIBS AND CHEST -  3+ VIEW COMPARISON:  11/12/2015 FINDINGS: No fracture or other bone lesions are seen involving the ribs. There is no evidence of pneumothorax or pleural effusion. Both lungs are clear. Heart size and mediastinal contours are within normal limits. IMPRESSION: Negative. Electronically Signed   By: Monte Fantasia M.D.   On: 11/28/2015 16:09    ED Clinical Impression  Muscle spasm of back  Thoracic injury, initial encounter  ED Assessment/Plan  Previous records reviewed as noted in history of present illness. We will give Norco one tablet because of age. we'll obtain chest x-ray/left rib series to rule out pneumonia or rib fractures.   Imaging independently reviewed. No obvious fracture. No pneumothorax or pleural effusion. See radiology report for details  Reevaluation, patient states that he feels significantly  better. Denies lightheadedness dizziness. He is tolerating the Norco well, so we'll send him home with some of this. Advised him to be careful with the muscle relaxant as it does increase his risk of falls.  Plan to send home with muscle relaxant, patient states that Robaxin as worked well in the past, [place], Tylenol. No NSAIDs because of the Plavix. He will follow-up with his primary care physician as needed. To the ER if gets worse.  Discussed imaging, MDM, plan and followup with patient. Discussed sn/sx that should prompt return to the ED. Patient agrees with plan.   Meds ordered this encounter  Medications  . HYDROcodone-acetaminophen (NORCO/VICODIN) 5-325 MG per tablet 1 tablet  . HYDROcodone-acetaminophen (NORCO/VICODIN) 5-325 MG tablet    Sig: Take 2 tablets by mouth every 6 (six) hours as needed for moderate pain.    Dispense:  20 tablet    Refill:  0  . methocarbamol (ROBAXIN) 500 MG tablet    Sig: Take 1 tablet (500 mg total) by mouth every 6 (six) hours as needed for muscle spasms.    Dispense:  20 tablet    Refill:  0    *This clinic note was created using Geographical information systems officer. Therefore, there may be occasional mistakes despite careful proofreading.  ?   Melynda Ripple, MD 11/28/15 1640

## 2015-12-01 ENCOUNTER — Ambulatory Visit (HOSPITAL_COMMUNITY)
Admission: EM | Admit: 2015-12-01 | Discharge: 2015-12-01 | Discharge status: Absent without leave | Payer: Medicare Other

## 2015-12-01 ENCOUNTER — Emergency Department (HOSPITAL_COMMUNITY): Payer: Medicare Other

## 2015-12-01 ENCOUNTER — Emergency Department (HOSPITAL_COMMUNITY)
Admission: EM | Admit: 2015-12-01 | Discharge: 2015-12-02 | Disposition: A | Discharge status: Home / Self Care | Payer: Medicare Other | Attending: Emergency Medicine | Admitting: Emergency Medicine

## 2015-12-01 ENCOUNTER — Encounter (HOSPITAL_COMMUNITY): Payer: Self-pay | Admitting: Emergency Medicine

## 2015-12-01 DIAGNOSIS — E1159 Type 2 diabetes mellitus with other circulatory complications: Secondary | ICD-10-CM | POA: Insufficient documentation

## 2015-12-01 DIAGNOSIS — I10 Essential (primary) hypertension: Secondary | ICD-10-CM | POA: Insufficient documentation

## 2015-12-01 DIAGNOSIS — R109 Unspecified abdominal pain: Secondary | ICD-10-CM | POA: Diagnosis not present

## 2015-12-01 DIAGNOSIS — Z8673 Personal history of transient ischemic attack (TIA), and cerebral infarction without residual deficits: Secondary | ICD-10-CM | POA: Diagnosis not present

## 2015-12-01 DIAGNOSIS — Z87891 Personal history of nicotine dependence: Secondary | ICD-10-CM | POA: Insufficient documentation

## 2015-12-01 DIAGNOSIS — M545 Low back pain, unspecified: Secondary | ICD-10-CM

## 2015-12-01 LAB — COMPREHENSIVE METABOLIC PANEL
ALT: 25 U/L (ref 17–63)
AST: 28 U/L (ref 15–41)
Albumin: 2.7 g/dL — ABNORMAL LOW (ref 3.5–5.0)
Alkaline Phosphatase: 142 U/L — ABNORMAL HIGH (ref 38–126)
Anion gap: 10 (ref 5–15)
BILIRUBIN TOTAL: 0.4 mg/dL (ref 0.3–1.2)
BUN: 16 mg/dL (ref 6–20)
CO2: 28 mmol/L (ref 22–32)
CREATININE: 1.03 mg/dL (ref 0.61–1.24)
Calcium: 9.1 mg/dL (ref 8.9–10.3)
Chloride: 97 mmol/L — ABNORMAL LOW (ref 101–111)
Glucose, Bld: 108 mg/dL — ABNORMAL HIGH (ref 65–99)
POTASSIUM: 4.4 mmol/L (ref 3.5–5.1)
Sodium: 135 mmol/L (ref 135–145)
TOTAL PROTEIN: 7.4 g/dL (ref 6.5–8.1)

## 2015-12-01 LAB — CBC WITH DIFFERENTIAL/PLATELET
BASOS ABS: 0 10*3/uL (ref 0.0–0.1)
Basophils Relative: 0 %
EOS PCT: 1 %
Eosinophils Absolute: 0.1 10*3/uL (ref 0.0–0.7)
HEMATOCRIT: 39 % (ref 39.0–52.0)
Hemoglobin: 12.1 g/dL — ABNORMAL LOW (ref 13.0–17.0)
LYMPHS ABS: 1 10*3/uL (ref 0.7–4.0)
LYMPHS PCT: 15 %
MCH: 30.3 pg (ref 26.0–34.0)
MCHC: 31 g/dL (ref 30.0–36.0)
MCV: 97.7 fL (ref 78.0–100.0)
MONO ABS: 1.1 10*3/uL — AB (ref 0.1–1.0)
Monocytes Relative: 16 %
NEUTROS ABS: 4.7 10*3/uL (ref 1.7–7.7)
Neutrophils Relative %: 68 %
Platelets: 343 10*3/uL (ref 150–400)
RBC: 3.99 MIL/uL — ABNORMAL LOW (ref 4.22–5.81)
RDW: 13.5 % (ref 11.5–15.5)
WBC: 6.9 10*3/uL (ref 4.0–10.5)

## 2015-12-01 LAB — URINALYSIS, ROUTINE W REFLEX MICROSCOPIC
Bilirubin Urine: NEGATIVE
GLUCOSE, UA: NEGATIVE mg/dL
KETONES UR: NEGATIVE mg/dL
Nitrite: NEGATIVE
PH: 7 (ref 5.0–8.0)
Protein, ur: NEGATIVE mg/dL
SPECIFIC GRAVITY, URINE: 1.01 (ref 1.005–1.030)

## 2015-12-01 LAB — URINE MICROSCOPIC-ADD ON

## 2015-12-01 LAB — I-STAT CG4 LACTIC ACID, ED: LACTIC ACID, VENOUS: 0.87 mmol/L (ref 0.5–1.9)

## 2015-12-01 LAB — MAGNESIUM: Magnesium: 2 mg/dL (ref 1.7–2.4)

## 2015-12-01 LAB — LIPASE, BLOOD: LIPASE: 19 U/L (ref 11–51)

## 2015-12-01 MED ORDER — ONDANSETRON HCL 4 MG/2ML IJ SOLN
4.0000 mg | Freq: Once | INTRAMUSCULAR | Status: AC
  Administered 2015-12-01: 4 mg via INTRAVENOUS
  Filled 2015-12-01: qty 2

## 2015-12-01 MED ORDER — MORPHINE SULFATE (PF) 4 MG/ML IV SOLN
4.0000 mg | Freq: Once | INTRAVENOUS | Status: AC
  Administered 2015-12-01: 4 mg via INTRAVENOUS
  Filled 2015-12-01: qty 1

## 2015-12-01 MED ORDER — SODIUM CHLORIDE 0.9 % IV BOLUS (SEPSIS)
1000.0000 mL | Freq: Once | INTRAVENOUS | Status: AC
  Administered 2015-12-01: 1000 mL via INTRAVENOUS

## 2015-12-01 MED ORDER — HYDROMORPHONE HCL 1 MG/ML IJ SOLN
0.5000 mg | Freq: Once | INTRAMUSCULAR | Status: AC
  Administered 2015-12-01: 0.5 mg via INTRAVENOUS
  Filled 2015-12-01: qty 1

## 2015-12-01 MED ORDER — HYDROMORPHONE HCL 1 MG/ML IJ SOLN
1.0000 mg | Freq: Once | INTRAMUSCULAR | Status: AC
  Administered 2015-12-01: 1 mg via INTRAVENOUS
  Filled 2015-12-01: qty 1

## 2015-12-01 NOTE — ED Provider Notes (Signed)
[facility] DEPT Provider Note   CSN: [id number] Arrival date & time: 12/01/15  1712     History   Chief Complaint Chief Complaint  Patient presents with  . Foot Swelling  . Bloated    HPI Marcus [name] is a 75 y.o. male with a past medical history significant for recurrent urinary tract infections on prophylactic Bactrim, prior lumbar spine surgeries, kidney stones, stroke, diabetes, hypertension, hyperlipidemia, and recent severe back spasms who presents with continued back pain and lower extremity edema. Patient reports that he's been sitting upright for the last 48 hours with intermittent standing as it hurts too much to lay down. He said he has not been able to sleep since the 19th of tremor due to back pain. He says that he has had similar pain radiating from his left back towards his abdomen that was associated with prior UTI. He says that he has had extensive workup over the last several weeks including CT scan which he reports is "completely normal with no stones". He says that he had ultrasounds, and other testing that did not show any infectious cause of his pain. He was diagnosed with severe musculoskeletal pain and spasms and is currently taking Vicodin and muscle relaxants. He is on Plavix or his stroke and prior carotid disease.  He describes his pain as 10 out of 10 in severity and radiating around his side. It does not radiate down his legs. He has no associated numbness, tingling, or weakness. No loss of bowel or bladder function. No recent trauma.  Patient reports he has had some nausea but no vomiting. He denies fevers, chills, chest pain, shortness of breath. He denies any change in his urine with no dysuria, hematuria, or cloudiness. He denies any pain in his legs and no history of DVT or PE.   The history is provided by the patient, the spouse, a relative and medical records. No language interpreter was used.  Back Pain   This is a recurrent problem. The current  episode started more than 1 week ago. The problem occurs constantly. The problem has been gradually worsening. Associated with: pt had onset of back pain several weeks ago while in CT scanner.  The pain is present in the lumbar spine. The quality of the pain is described as aching and stabbing. The pain does not radiate. The pain is at a severity of 10/10. The pain is severe. The symptoms are aggravated by certain positions, twisting and bending. Associated symptoms include abdominal pain. Pertinent negatives include no chest pain, no fever, no numbness, no headaches, no bowel incontinence, no perianal numbness, no bladder incontinence, no dysuria, no pelvic pain, no leg pain, no paresthesias, no paresis and no weakness. He has tried muscle relaxants and analgesics for the symptoms. The treatment provided mild relief.    Past Medical History:  Diagnosis Date  . Anxiety state, unspecified   . Calculus of kidney   . 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 Complication of anesthesia    " I woke up in the recovery room with tube in throat and panicked."  . 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 Herpes zoster without mention of complication   . History of hiatal hernia   . Hypertrophy of prostate with urinary obstruction and other lower urinary tract symptoms (LUTS)   . Insomnia, unspecified   . Lumbago   . Other and unspecified hyperlipidemia   . Pneumonia    bronchial  . Reflux  esophagitis   . Seasonal allergies   . Stroke ([place])   . TIA (transient ischemic attack)   . Type II or unspecified type diabetes mellitus without mention of complication, not stated as uncontrolled   . Unspecified essential hypertension   . Unspecified vitamin D deficiency   . Urethral stricture unspecified   . Urinary frequency     Patient Active Problem List   Diagnosis Date Noted  . Dyspnea 11/12/2015  . Chest pain 11/12/2015  . S/P carotid endarterectomy 04/10/2015  . 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 Glaucoma 03/20/2015  . Type 2 diabetes mellitus with other circulatory complications ([place]) [id number]  . HLD (hyperlipidemia) 10/10/2014  . CVA (cerebral vascular accident) ([place]) 10/10/2014  . 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 Essential hypertension   . Carotid stenosis   . TIA (transient ischemic attack) 06/01/2014  . Malaise and fatigue 11/08/2013  . Elevated alkaline phosphatase level 07/20/2012  . Lumbago   . Benign essential tremor   . Vitamin D deficiency     Past Surgical History:  Procedure Laterality Date  . CARDIAC CATHETERIZATION  1986   normal, Dr Lia Foyer  . ENDARTERECTOMY Left 06/08/2014   Procedure: LEFT CAROTID ARTERY ENDARTERECTOMY;  Surgeon: Rosetta Posner, MD;  Location: [facility];  Service: Vascular;  Laterality: Left;  . EYE SURGERY  2014   cataract  . EYE SURGERY  2013   Retina  . PATCH ANGIOPLASTY Left 06/08/2014   Procedure: WITH DACRON PATCH ANGIOPLASTY;  Surgeon: Rosetta Posner, MD;  Location: [facility];  Service: Vascular;  Laterality: Left;  . SPINAL FUSION  12/91,11/91   obtained bone from left lower leg  . stress thallium  1994    normal;  . stretching bladder  07/2007   Dr Risa Grill       Home Medications    Prior to Admission medications   Medication Sig Start Date End Date Taking? Authorizing Provider  acetaminophen (TYLENOL) 500 MG tablet Take 1,000 mg by mouth every 6 (six) hours as needed for mild pain or moderate pain.    Historical Provider, MD  atorvastatin (LIPITOR) 40 MG tablet TAKE ONE TABLET BY MOUTH ONCE DAILY FOR CHOLESTEROL 11/20/15   Estill Dooms, MD  Cholecalciferol (VITAMIN D PO) Take 1 tablet by mouth daily at 12 noon.    Historical Provider, MD  clopidogrel (PLAVIX) 75 MG tablet TAKE 1 TABLET BY MOUTH DAILY 07/04/15   Rosetta Posner, MD  HYDROcodone-acetaminophen (NORCO/VICODIN) 5-325 MG tablet Take 1 tablet by mouth every 6 (six) hours as  needed for moderate pain. 11/28/15   Melynda Ripple, MD  methocarbamol (ROBAXIN) 500 MG tablet Take 1 tablet (500 mg total) by mouth every 6 (six) hours as needed for muscle spasms. 11/28/15   Melynda Ripple, MD  nicotine polacrilex (COMMIT) 2 MG lozenge Take 2 mg by mouth 3 (three) times daily.     Historical Provider, MD  sulfamethoxazole-trimethoprim (BACTRIM DS,SEPTRA DS) 800-160 MG tablet One tablet daily for UTI 11/11/15   Historical Provider, MD  timolol (TIMOPTIC) 0.25 % ophthalmic solution Place 1 drop into both eyes 2 (two) times daily.    Historical Provider, MD    Family History Family History  Problem Relation Age of Onset  . Diabetes Mother   . Cancer Mother   . Hypertension Father   . Stroke Father   . Alcohol abuse Brother  Social History Social History  Substance Use Topics  . Smoking status: Former Smoker    Types: Cigarettes    Quit date: 01/01/1991  . Smokeless tobacco: Never Used     Comment: " Quit around 1986"  . Alcohol use No     Allergies   Codeine and Demerol [meperidine]   Review of Systems Review of Systems  Constitutional: Negative for chills, diaphoresis, fatigue and fever.  Respiratory: Negative for cough, chest tightness, shortness of breath, wheezing and stridor.   Cardiovascular: Positive for leg swelling. Negative for chest pain and palpitations.  Gastrointestinal: Positive for abdominal pain. Negative for bowel incontinence, diarrhea, nausea and vomiting.  Genitourinary: Positive for flank pain. Negative for bladder incontinence, difficulty urinating, dysuria, pelvic pain and testicular pain.  Musculoskeletal: Positive for back pain. Negative for neck pain and neck stiffness.  Skin: Negative for wound.  Neurological: Negative for dizziness, seizures, weakness, light-headedness, numbness, headaches and paresthesias.  Psychiatric/Behavioral: Negative for agitation and confusion.  All other systems reviewed and are  negative.    Physical Exam Updated Vital Signs BP 101/66 (BP Location: Right Arm)   Pulse 87   Temp 98.2 F (36.8 C) (Oral)   Resp 20   Ht 5' 7.5" (1.715 m)   Wt 206 lb (93.4 kg)   SpO2 97%   BMI 31.79 kg/m   Physical Exam  Constitutional: He is oriented to person, place, and time. He appears well-developed and well-nourished.  HENT:  Head: Normocephalic and atraumatic.  Mouth/Throat: Oropharynx is clear and moist. No oropharyngeal exudate.  Eyes: Conjunctivae and EOM are normal. Pupils are equal, round, and reactive to light.  Neck: Normal range of motion. Neck supple.  Cardiovascular: Normal rate and regular rhythm.   No murmur heard. Pulmonary/Chest: Effort normal and breath sounds normal. No stridor. No respiratory distress. He has no wheezes. He exhibits no tenderness.  Abdominal: Soft. There is no tenderness.  Musculoskeletal: He exhibits tenderness.       Lumbar back: He exhibits tenderness, pain and spasm.       Back:       Right lower leg: He exhibits edema.       Left lower leg: He exhibits edema.  Mild bilateral lower extremity edema with no tenderness. Normal pulses, strength, and sensation in legs.  Neurological: He is alert and oriented to person, place, and time. He displays normal reflexes. No cranial nerve deficit. He exhibits normal muscle tone. Coordination normal.  Skin: Skin is warm and dry. Capillary refill takes less than 2 seconds.  Psychiatric: He has a normal mood and affect.  Nursing note and vitals reviewed.    ED Treatments / Results  Labs (all labs ordered are listed, but only abnormal results are displayed) Labs Reviewed  CBC WITH DIFFERENTIAL/PLATELET - Abnormal; Notable for the following:       Result Value   RBC 3.99 (*)    Hemoglobin 12.1 (*)    Monocytes Absolute 1.1 (*)    All other components within normal limits  COMPREHENSIVE METABOLIC PANEL - Abnormal; Notable for the following:    Chloride 97 (*)    Glucose, Bld 108 (*)     Albumin 2.7 (*)    Alkaline Phosphatase 142 (*)    All other components within normal limits  URINALYSIS, ROUTINE W REFLEX MICROSCOPIC (NOT AT [facility]) - Abnormal; Notable for the following:    APPearance CLOUDY (*)    Hgb urine dipstick LARGE (*)    Leukocytes, UA LARGE (*)  All other components within normal limits  URINE MICROSCOPIC-ADD ON - Abnormal; Notable for the following:    Squamous Epithelial / LPF 0-5 (*)    Bacteria, UA RARE (*)    All other components within normal limits  URINE CULTURE  LIPASE, BLOOD  MAGNESIUM  I-STAT CG4 LACTIC ACID, ED    EKG  EKG Interpretation None       Radiology Ct Renal Stone Study  Result Date: 12/02/2015 CLINICAL DATA:  Swelling to both legs and the abdomen. Pain in the back radiating to the left flank and lower abdomen. History of urinary tract infection. Hematuria. EXAM: CT ABDOMEN AND PELVIS WITHOUT CONTRAST TECHNIQUE: Multidetector CT imaging of the abdomen and pelvis was performed following the standard protocol without IV contrast. COMPARISON:  11/19/2015 FINDINGS: Lower chest: Calcified granuloma in the right lung base. Mild pericardial calcification. Small esophageal hiatal hernia. Hepatobiliary: Noncontrast appearance is unremarkable. Pancreas: Noncontrast appearance is unremarkable. Spleen: Noncontrast appearance is unremarkable. Adrenals/Urinary Tract: No adrenal gland nodules. Scarring or atrophy of the kidneys, more prominent on the right. Small cyst in the anterior left kidney. Stones in the lower pole right kidney. No hydronephrosis or hydroureter. No ureteral stones or bladder stones. No bladder wall thickening. Stomach/Bowel: Stomach and small bowel are decompressed. Scattered stool and gas in the colon without abnormal distention. Scattered colonic diverticula. No evidence of diverticulitis. Appendix is normal. Vascular/Lymphatic: Aortic atherosclerosis. No enlarged abdominal or pelvic lymph nodes. Reproductive: Prostate gland  is enlarged, measuring 5.1 cm diameter. Prostate calcifications. Other: No abdominal wall hernia or abnormality. No abdominopelvic ascites. Musculoskeletal: Degenerative changes in the spine and hips. Postoperative changes with posterior fixation from L4 to the sacrum. Persistent moderate anterior subluxation of L5 on the sacrum. No change in the appearance of the spine since previous study. IMPRESSION: Nonobstructing stone in the right kidney. No hydronephrosis or hydroureter. Prostate gland is enlarged. No evidence of bowel obstruction or inflammation. Electronically Signed   By: Lucienne Capers M.D.   On: 12/02/2015 00:56    Procedures Procedures (including critical care time)  Medications Ordered in ED Medications  morphine 4 MG/ML injection 4 mg (4 mg Intravenous Given 12/01/15 1830)  sodium chloride 0.9 % bolus 1,000 mL (1,000 mLs Intravenous New Bag/Given 12/01/15 1830)  ondansetron (ZOFRAN) injection 4 mg (4 mg Intravenous Given 12/01/15 1830)  ondansetron (ZOFRAN) injection 4 mg (4 mg Intravenous Given 12/01/15 2117)  morphine 4 MG/ML injection 4 mg (4 mg Intravenous Given 12/01/15 2117)  HYDROmorphone (DILAUDID) injection 1 mg (1 mg Intravenous Given 12/01/15 2247)  HYDROmorphone (DILAUDID) injection 0.5 mg (0.5 mg Intravenous Given 12/01/15 2358)     Initial Impression / Assessment and Plan / ED Course  I have reviewed the triage vital signs and the nursing notes.  Pertinent labs & imaging results that were available during my care of the patient were reviewed by me and considered in my medical decision making (see chart for details).  Clinical Course    Marcus [name] is a 75 y.o. male with a past medical history significant for recurrent urinary tract infections on prophylactic Bactrim, prior lumbar spine surgeries, kidney stones, stroke, diabetes, hypertension, hyperlipidemia, and recent severe back spasms who presents with continued back pain and lower extremity edema.   History  and exam are seen above.  Based on patient's symptoms, patient had workup to look for infection versus kidney stone versus musculoskeletal pain.  Patient initially given morphine with mild improvement in pain and drastic improvement with Dilaudid. Patient had  normal lactic acid, normal lipase, normal kidney function and grossly unremarkable electrolytes, no evidence of leukocytosis. Patient's urinalysis showed leukocytes and hemoglobin and the micro-showed rare bacteria and epithelial cells. Culture was sent.   Patient is already on Bactrim as prescribed by his PCP for UTI. Given patient's normal kidney function, lack of leukocytosis, negative nitrites, and otherwise no to him such as dysuria, foul-smelling urine, or discoloration, feel patient does not require antibiotic alteration at this time for UTI. Patient does not have evidence of pyelonephritis based on laboratory testing. Lactic acid was not elevated.  With the large blood, CT scan was ordered to look for hydronephrosis, abscess, or stone. The stone was seen on the right kidney where he was not having tenderness. Feel this might be the source of the blood. Do not feel patient has infected stone as there is no stone in ureters urethra, or bladder.  Patient felt to have severe musculoskeletal pain and spasm as cause of discomfort. Family reports that they're able to help control his pain with the Vicodin and muscle relaxants however, patient becomes sleepy and groggy. They're concerned about the patient having a fall while he is drowsy.   Patient will be prescribed Ultram to use instead of Vicodin. Patient will be continued on his muscle relaxant. Patient will continue his antibiotic use as prescribed by PCP based on his prior urine cultures.   Agents lower extremity edema felt to be related to patient constantly standing or sitting up with his pain. Do not feel patient has DVT given lack of pain in legs.  Patient will need to follow-up with  his PCP as well as consider pain management specialist or physical therapist for the muscle pain. As patient has significant improvement in symptoms, feel patient may be able to maintain pain relief with home medicines now that he is not in severe exacerbation.  Family understood return precautions for new or worsening symptoms. Family understood follow-up instructions.  Pt discharged in good condition.    Final Clinical Impressions(s) / ED Diagnoses   Final diagnoses:  Acute left-sided low back pain without sciatica    New Prescriptions New Prescriptions   TRAMADOL (ULTRAM) 50 MG TABLET    Take 1 tablet (50 mg total) by mouth every 6 (six) hours as needed.    Clinical Impression: 1. Acute left-sided low back pain without sciatica     Disposition: Discharge  Condition: Good  I have discussed the results, Dx and Tx plan with the pt(& family if present). He/she/they expressed understanding and agree(s) with the plan. Discharge instructions discussed at great length. Strict return precautions discussed and pt &/or family have verbalized understanding of the instructions. No further questions at time of discharge.    New Prescriptions   TRAMADOL (ULTRAM) 50 MG TABLET    Take 1 tablet (50 mg total) by mouth every 6 (six) hours as needed.    Follow Up: Estill Dooms, MD [place] [address] [phone]     [facility] [address] [place] [address] [phone]  Schedule an appointment as soon as possible for a visit    Godley Basye [place] [phone] Schedule an appointment as soon as possible for a visit       Courtney Paris, MD 12/02/15 1431

## 2015-12-01 NOTE — ED Notes (Signed)
Gave pt heating pad.

## 2015-12-01 NOTE — ED Triage Notes (Addendum)
Pt c/o swelling to B/L legs and abdomen. Pt c/o pain to left mid back that radiates around left flank and lower abdomen area. Pt has had multiple xray's, CT's, and ultrasounds to rule out kidney stones and gallbladder issues. Pt has history of UTI's. Pt taken Vicoden and robaxin with no relief. During a CT exam the pt vomited while in the scanner but staff was not aware. Pt was checked by PMD for aspiration and cracked ribs. Family stopped Vicoden due to pt appearing lethargic and loopy.

## 2015-12-02 ENCOUNTER — Encounter: Payer: Self-pay | Admitting: Nurse Practitioner

## 2015-12-02 ENCOUNTER — Emergency Department (HOSPITAL_COMMUNITY): Payer: Medicare Other

## 2015-12-02 ENCOUNTER — Ambulatory Visit (INDEPENDENT_AMBULATORY_CARE_PROVIDER_SITE_OTHER): Payer: Medicare Other | Admitting: Nurse Practitioner

## 2015-12-02 VITALS — BP 112/72 | HR 85 | Temp 98.1°F | Resp 18

## 2015-12-02 DIAGNOSIS — B029 Zoster without complications: Secondary | ICD-10-CM

## 2015-12-02 DIAGNOSIS — M545 Low back pain, unspecified: Secondary | ICD-10-CM

## 2015-12-02 DIAGNOSIS — R110 Nausea: Secondary | ICD-10-CM | POA: Diagnosis not present

## 2015-12-02 MED ORDER — ONDANSETRON HCL 4 MG PO TABS: 4.0000 mg | ORAL_TABLET | Freq: Three times a day (TID) | ORAL | 0 refills | Status: AC | PRN

## 2015-12-02 MED ORDER — PREDNISONE 10 MG PO TABS: ORAL_TABLET | ORAL | 0 refills | Status: DC

## 2015-12-02 MED ORDER — TRAMADOL HCL 50 MG PO TABS: 50.0000 mg | ORAL_TABLET | Freq: Four times a day (QID) | ORAL | 0 refills | Status: DC | PRN

## 2015-12-02 MED ORDER — VALACYCLOVIR HCL 1 G PO TABS: 1000.0000 mg | ORAL_TABLET | Freq: Three times a day (TID) | ORAL | 0 refills | Status: DC

## 2015-12-02 MED ORDER — GABAPENTIN 100 MG PO CAPS: 100.0000 mg | ORAL_CAPSULE | Freq: Three times a day (TID) | ORAL | 1 refills | Status: DC | PRN

## 2015-12-02 NOTE — Patient Instructions (Signed)
To take prednisone by mouth daily for 1 week 40 mg , 40 mg, 40 mg, 40 mg, 30 mg, 20 mg, 10 mg  Valtrex 1 gm every 8 hours for 1 week To use gabapentin 100 mg 1 tablet three times daily as needed for pain May increase to 2 tablets three times daily if needed  To take medication with food to avoid nausea

## 2015-12-02 NOTE — ED Notes (Signed)
Patient Returned from CT.

## 2015-12-02 NOTE — Discharge Instructions (Signed)
Please follow-up with PCP for further management of symptoms. Please follow-up with urology for hematuria and concern of persistent infection. Please follow-up with pain management for severe muscle pain difficult to manage at home. Please return to the emergency department if any symptoms worsen. Please stop taking the Vicodin if you are going to use the Ultram for pain relief.

## 2015-12-02 NOTE — Progress Notes (Signed)
Careteam: Patient Care Team: Estill Dooms, MD as PCP - General (Internal Medicine) Rana Snare, MD as Consulting Physician (Urology) Darleen Crocker, MD as Consulting Physician (Ophthalmology) Rosalin Hawking, MD as Consulting Physician (Neurology)  Advanced Directive information Does patient have an advance directive?: Yes, Type of Advance Directive: Living will  Allergies  Allergen Reactions  . Codeine Other (See Comments)    constipation  . Demerol [Meperidine] Other (See Comments)    Drops blood pressure     Chief Complaint  Patient presents with  . Acute Visit    Pt in constant pain due to chronic left back spasm; swollen legs; Need referral for pain clinic, PT for back;   . Other    wife in room     HPI: Patient is a 74 y.o. male seen in the office today to follow up ED visit.  pt  with a past medical history significant for recurrent urinary tract infections on prophylactic Bactrim, prior lumbar spine surgeries, kidney stones, stroke, diabetes, hypertension, hyperlipidemia, and recent severe back spasms who presents with continued back pain and lower extremity edema Pt was following with urologist due to to back to back UTI.  Followed up with Dr Nyoka Cowden and had chest xray to rule out cardiac or pulmonary causes Went back to urologist and CT was ordered. While he was in the CT scanner he started vomiting. Was stuck inside the scanner and attempted to get out which made the pain worse.  At that time he went to the urgent care and did work up which was negative. Put him on muscle relaxer and Vicodin went home. Could not lay down, staying in chair and pain got worse so they went to the ED.  During ED visit took blood work and imagining. Kidney stone on right.  No findings to explain the pain on the left side.  Had to give him dilaudid to get him through the CT scan so he could lay flat.  Discharged him on ultram and robaxin, stopped his hydrocodone due to it making him too  groggy.  ED physician recommended pain clinic referral.  Says its like an axe has just hit him.  Pain 14/10 Review of Systems:  Review of Systems  Constitutional: Negative for activity change, appetite change, fatigue, fever and unexpected weight change.  HENT: Negative for congestion, ear pain, hearing loss, rhinorrhea, sinus pressure, sore throat, tinnitus, trouble swallowing and voice change.   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  Respiratory: Negative for cough, choking, chest tightness, shortness of breath and wheezing.   Cardiovascular: Negative.  Negative for chest pain, palpitations and leg swelling.  Gastrointestinal: Negative for abdominal distention, abdominal pain, diarrhea and nausea.  Endocrine: Negative for cold intolerance, heat intolerance, polydipsia, polyphagia and polyuria.       History of mild elevations in glucose  Genitourinary: Negative for dysuria, flank pain, frequency, testicular pain and urgency.       Not incontinent  Musculoskeletal: Positive for arthralgias, back pain and myalgias. Negative for gait problem and neck pain.  Skin: Negative for color change, pallor and rash.  Allergic/Immunologic: Negative.   Neurological: Negative for dizziness, tremors, syncope, speech difficulty, weakness, numbness and headaches.       TIA 06/01/2014. Left carotid endarterectomy 06/08/14.  Hematological: Negative.  Negative for adenopathy. Does not bruise/bleed easily.  Psychiatric/Behavioral: Negative for agitation, behavioral problems, confusion, decreased concentration, hallucinations and sleep disturbance. The patient is not nervous/anxious.  Past Medical History:  Diagnosis Date  . Anxiety state, unspecified   . Calculus of kidney   . 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 Complication of anesthesia    " I woke up in the recovery room with tube in throat and panicked."  . 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 Herpes zoster without  mention of complication   . History of hiatal hernia   . Hypertrophy of prostate with urinary obstruction and other lower urinary tract symptoms (LUTS)   . Insomnia, unspecified   . Lumbago   . Other and unspecified hyperlipidemia   . Pneumonia    bronchial  . Reflux esophagitis   . Seasonal allergies   . Stroke ([place])   . TIA (transient ischemic attack)   . Type II or unspecified type diabetes mellitus without mention of complication, not stated as uncontrolled   . Unspecified essential hypertension   . Unspecified vitamin D deficiency   . Urethral stricture unspecified   . Urinary frequency    Past Surgical History:  Procedure Laterality Date  . CARDIAC CATHETERIZATION  1986   normal, Dr Lia Foyer  . ENDARTERECTOMY Left 06/08/2014   Procedure: LEFT CAROTID ARTERY ENDARTERECTOMY;  Surgeon: Rosetta Posner, MD;  Location: [place];  Service: Vascular;  Laterality: Left;  . EYE SURGERY  2014   cataract  . EYE SURGERY  2013   Retina  . PATCH ANGIOPLASTY Left 06/08/2014   Procedure: WITH DACRON PATCH ANGIOPLASTY;  Surgeon: Rosetta Posner, MD;  Location: [place];  Service: Vascular;  Laterality: Left;  . SPINAL FUSION  12/91,11/91   obtained bone from left lower leg  . stress thallium  1994    normal;  . stretching bladder  07/2007   Dr Risa Grill   Social History:   reports that he quit smoking about 24 years ago. His smoking use included Cigarettes. He has never used smokeless tobacco. He reports that he does not drink alcohol or use drugs.  Family History  Problem Relation Age of Onset  . Diabetes Mother   . Cancer Mother   . Hypertension Father   . Stroke Father   . Alcohol abuse Brother     Medications: Patient's Medications  New Prescriptions   GABAPENTIN (NEURONTIN) 100 MG CAPSULE    Take 1 capsule (100 mg total) by mouth 3 (three) times daily as needed.   ONDANSETRON (ZOFRAN) 4 MG TABLET    Take 1 tablet (4 mg total) by mouth every 8 (eight) hours as needed for nausea or vomiting.     PREDNISONE (DELTASONE) 10 MG TABLET    40 mg daily for 4 days then decrease by 1 tablet daily until complete, 40/40/40/40/30/20/10   VALACYCLOVIR (VALTREX) 1000 MG TABLET    Take 1 tablet (1,000 mg total) by mouth 3 (three) times daily.  Previous Medications   ACETAMINOPHEN (TYLENOL) 500 MG TABLET    Take 1,000 mg by mouth every 6 (six) hours as needed for mild pain or moderate pain.   ATORVASTATIN (LIPITOR) 40 MG TABLET    TAKE ONE TABLET BY MOUTH ONCE DAILY FOR CHOLESTEROL   CHOLECALCIFEROL (VITAMIN D PO)    Take 1 tablet by mouth daily at 12 noon.   CLOPIDOGREL (PLAVIX) 75 MG TABLET    TAKE 1 TABLET BY MOUTH DAILY   LORAZEPAM (ATIVAN) 0.5 MG TABLET    Take 0.5 mg by mouth 3 (three) times daily as needed.   METHOCARBAMOL (ROBAXIN) 500 MG TABLET  Take 1 tablet (500 mg total) by mouth every 6 (six) hours as needed for muscle spasms.   NICOTINE POLACRILEX (COMMIT) 2 MG LOZENGE    Take 2 mg by mouth 3 (three) times daily.    SULFAMETHOXAZOLE-TRIMETHOPRIM (BACTRIM DS,SEPTRA DS) 800-160 MG TABLET    One tablet daily for UTI   TIMOLOL (TIMOPTIC) 0.25 % OPHTHALMIC SOLUTION    Place 1 drop into both eyes 2 (two) times daily.   TRAMADOL (ULTRAM) 50 MG TABLET    Take 1 tablet (50 mg total) by mouth every 6 (six) hours as needed.  Modified Medications   No medications on file  Discontinued Medications   HYDROCODONE-ACETAMINOPHEN (NORCO/VICODIN) 5-325 MG TABLET    Take 1 tablet by mouth every 6 (six) hours as needed for moderate pain.     Physical Exam:  Vitals:   12/02/15 1443  BP: 112/72  Pulse: 85  Resp: 18  Temp: 98.1 F (36.7 C)  TempSrc: Oral  SpO2: 94%   There is no height or weight on file to calculate BMI.  Physical Exam  Constitutional: He is oriented to person, place, and time. He appears well-developed and well-nourished. No distress.  HENT:  Head: Atraumatic.  Eyes: Conjunctivae and EOM are normal. Pupils are equal, round, and reactive to light.  Cardiovascular: Normal  rate, regular rhythm, normal heart sounds and intact distal pulses.  Exam reveals no gallop and no friction rub.   No murmur heard. Pulmonary/Chest: No respiratory distress. He has no rales.  Slight wheeze intermittently. Splinting respirations.  Abdominal: He exhibits no distension and no mass. There is no tenderness.  Musculoskeletal: Normal range of motion. He exhibits no edema or tenderness.  Improvement in left lower back pain. Cannot remain in one position long. Has to move. Now pain is mid back left side radiating around under arm to front of chest. Tender with any movement, very painful to touch  Single vesicle note to left lateral chest  Neurological: He is alert and oriented to person, place, and time.  Skin: No rash noted. No erythema. No pallor.  Psychiatric: He has a normal mood and affect. His behavior is normal. Judgment and thought content normal.    Labs reviewed: Basic Metabolic Panel:  Recent Labs  05/30/15 1228 06/21/15 0829 09/16/15 0840 12/01/15 1820  NA  --  139 139 135  K  --  4.5 4.1 4.4  CL  --  98 107 97*  CO2  --  25 27 28   GLUCOSE  --  106* 94 108*  BUN  --  20 24 16   CREATININE  --  1.08 1.22* 1.03  CALCIUM  --  9.2 8.5* 9.1  MG  --   --   --  2.0  TSH 1.660  --   --   --    Liver Function Tests:  Recent Labs  06/21/15 0829 09/16/15 0840 12/01/15 1820  AST 22 21 28   ALT 17 18 25   ALKPHOS 135* 130* 142*  BILITOT 0.3 0.4 0.4  PROT 6.6 6.4 7.4  ALBUMIN 3.9 3.5* 2.7*    Recent Labs  05/30/15 1228 06/05/15 1627 06/21/15 0829 12/01/15 1820  LIPASE 173* 166* 24 19  AMYLASE 254* 235* 129*  --    No results for input(s): AMMONIA in the last 8760 hours. CBC:  Recent Labs  05/30/15 1228 12/01/15 1820  WBC 3.7 6.9  NEUTROABS 1.8 4.7  HGB  --  12.1*  HCT 44.3 39.0  MCV 96 97.7  PLT 207 343   Lipid Panel:  Recent Labs  03/15/15 0803 06/21/15 0829 09/16/15 0840  CHOL 89* 111 101*  HDL 31* 30* 30*  LDLCALC 46 67 57  TRIG  59 71 69  CHOLHDL 2.9 3.7 3.4   TSH:  Recent Labs  05/30/15 1228  TSH 1.660   A1C: Lab Results  Component Value Date   HGBA1C 5.8 (H) 09/16/2015     Assessment/Plan 1. Herpes zoster without complication -pain description reflects nerve in origin, with single vesicle noted will treat for singles. Not to place any cream or patches on vesicles  - predniSONE (DELTASONE) 10 MG tablet; 40 mg daily for 4 days then decrease by 1 tablet daily until complete, 40/40/40/40/30/20/10  Dispense: 22 tablet; Refill: 0 - gabapentin (NEURONTIN) 100 MG capsule; Take 1 capsule (100 mg total) by mouth 3 (three) times daily as needed.  Dispense: 90 capsule; Refill: 1 - valACYclovir (VALTREX) 1000 MG tablet; Take 1 tablet (1,000 mg total) by mouth 3 (three) times daily.  Dispense: 21 tablet; Refill: 0  2. Nausea without vomiting - ondansetron (ZOFRAN) 4 MG tablet; Take 1 tablet (4 mg total) by mouth every 8 (eight) hours as needed for nausea or vomiting.  Dispense: 20 tablet; Refill: 0  3. Acute left-sided low back pain without sciatica Improvement to initial left sided back pain.  Cont robaxin PRN  To call if no improvement or worsening of symptoms for reevaluation.  Carlos American. Harle Battiest  [facility] [phone] 8 am - 5 pm) [phone] (after hours)

## 2015-12-02 NOTE — ED Notes (Signed)
Patient taken to CT.

## 2015-12-03 ENCOUNTER — Telehealth: Payer: Self-pay | Admitting: *Deleted

## 2015-12-03 LAB — URINE CULTURE

## 2015-12-03 NOTE — Telephone Encounter (Signed)
Called Debbie and she reports medication is working, day and night difference  Not giving tramadol and robaxin  Gabapentin effective

## 2015-12-03 NOTE — Telephone Encounter (Signed)
Wife, Jackelyn Poling called with update on patient. Was seen in office yesterday. Shingles medication is working, but wife is wanting to speak with you directly and wants you to call her # [phone]

## 2015-12-05 ENCOUNTER — Other Ambulatory Visit: Payer: Self-pay

## 2015-12-05 MED ORDER — METHOCARBAMOL 500 MG PO TABS: 500.0000 mg | ORAL_TABLET | Freq: Four times a day (QID) | ORAL | 0 refills | Status: DC | PRN

## 2015-12-10 ENCOUNTER — Other Ambulatory Visit: Payer: Self-pay | Admitting: Internal Medicine

## 2015-12-10 ENCOUNTER — Telehealth: Payer: Self-pay | Admitting: *Deleted

## 2015-12-10 DIAGNOSIS — B0223 Postherpetic polyneuropathy: Secondary | ICD-10-CM

## 2015-12-10 MED ORDER — DULOXETINE HCL 30 MG PO CPEP: ORAL_CAPSULE | ORAL | 3 refills | Status: DC

## 2015-12-10 NOTE — Telephone Encounter (Signed)
Wife showed up at office and is speaking with Dr. Nyoka Cowden at this moment

## 2015-12-10 NOTE — Telephone Encounter (Signed)
Patient wife, Jackelyn Poling called with an update on patient. Has concerns and wants to speak with you regarding some issues. Please Call # [phone]

## 2015-12-10 NOTE — Telephone Encounter (Signed)
He needs an OV, please schedule with myself or Dr Nyoka Cowden

## 2015-12-10 NOTE — Progress Notes (Signed)
Discussed patient's current condition with his wife and then with him by phone. He was treated with dilaudis and morphine through the ER 12/01/15. He got very groggy and was not thinking clearly. Under went a personality change. Continues to complain of pain on the ;left side "all the time" . Says he cannot get any relief. Had been put on gabapentin. Initially, this seemed to help , but this is no longer true. He had also started having edema of both legs.  Other medications that have been tried include Vicodin and baclofen. His insurance would not cover Robaxin.   His wife took him off all the meds except for the gabapentin, but he has only partially cleared mentally.  I am suspicious he is having a reaction to one or more of the drugs he has been prescribed.   I recommend stopping gabapentin and starting Cymbalta. I advised wife and patient I think it is Ok to use the Vicodin every 6 hours is needed. I warned of link to constipation.

## 2015-12-10 NOTE — Telephone Encounter (Signed)
Called Debbie and she stated that patient was taking a lot of Narcotics that were making him incoherent. They stopped them this past weekend. He was wanting the pain medications but they said that it didn't seem like it was doing him any good. They went to Tylenol and he seem more of himself. Been in bed since Shingles diagnosis. Complaining that moving is too difficulty. Not eating because he says it upsets his stomach. Not having BM because not eating. Does not want to try to get better. Wondering if PT will help him. States that he sleeps good, doesn't think he is in that much pain. Patient plays mind games. Family not sure what to do and wants suggestions. Today patient was lying in bed and has not been up at all except to go to the bathroom twice because states he is in pain and its not in a specific spot, he says something different every time asked. Wondering about PT, Pain Management a Provider to tell him he is not dying. Wife about to lose it. Wife had to go get him diapers because he won't get up to pee. "He just doesn't want to get up and do nothing". Its like he doesn't want to get better. Family does not want him on any Narcotics because he does not do well on that. Only giving him the Gabapentin and Tylenol for pain. Please Advise.

## 2015-12-10 NOTE — Telephone Encounter (Signed)
If she needs to she can make an appt, otherwise to leave concerns through triage

## 2015-12-12 ENCOUNTER — Telehealth: Payer: Self-pay

## 2015-12-12 NOTE — Telephone Encounter (Signed)
Dr. Nyoka Cowden received a call from Marcus [name] patient's wife. Dr. Nyoka Cowden hand written Rx for Hydrocodone/APAP 5/325 #50 take one every 6 hours if needed for pain, 2 refills. Left message on home phone. I called pharmacy they will not take Rx over the phone.

## 2015-12-12 NOTE — Telephone Encounter (Signed)
Received a letter from [facility] stating that ondansetron has been denied. Forms have been placed in [place] folder for review and medication changes if possible.

## 2015-12-13 NOTE — Telephone Encounter (Signed)
Called and spoke with wife Jackelyn Poling, she called Dr. Nyoka Cowden and picked the Rx at [facility] yesterday, from him.

## 2015-12-14 ENCOUNTER — Emergency Department (HOSPITAL_COMMUNITY): Payer: Medicare Other

## 2015-12-14 ENCOUNTER — Inpatient Hospital Stay (HOSPITAL_COMMUNITY)
Admission: EM | Admit: 2015-12-14 | Discharge: 2015-12-24 | DRG: 094 | Disposition: A | Discharge status: Rehabilitation Facility | Payer: Medicare Other | Attending: Internal Medicine | Admitting: Internal Medicine

## 2015-12-14 ENCOUNTER — Encounter (HOSPITAL_COMMUNITY): Payer: Self-pay | Admitting: Emergency Medicine

## 2015-12-14 DIAGNOSIS — B9562 Methicillin resistant Staphylococcus aureus infection as the cause of diseases classified elsewhere: Secondary | ICD-10-CM | POA: Diagnosis present

## 2015-12-14 DIAGNOSIS — Z87891 Personal history of nicotine dependence: Secondary | ICD-10-CM

## 2015-12-14 DIAGNOSIS — R208 Other disturbances of skin sensation: Secondary | ICD-10-CM

## 2015-12-14 DIAGNOSIS — G8250 Quadriplegia, unspecified: Secondary | ICD-10-CM | POA: Diagnosis present

## 2015-12-14 DIAGNOSIS — K219 Gastro-esophageal reflux disease without esophagitis: Secondary | ICD-10-CM | POA: Diagnosis present

## 2015-12-14 DIAGNOSIS — R339 Retention of urine, unspecified: Secondary | ICD-10-CM | POA: Diagnosis present

## 2015-12-14 DIAGNOSIS — I69351 Hemiplegia and hemiparesis following cerebral infarction affecting right dominant side: Secondary | ICD-10-CM | POA: Diagnosis not present

## 2015-12-14 DIAGNOSIS — R531 Weakness: Secondary | ICD-10-CM | POA: Diagnosis present

## 2015-12-14 DIAGNOSIS — I6522 Occlusion and stenosis of left carotid artery: Secondary | ICD-10-CM | POA: Diagnosis not present

## 2015-12-14 DIAGNOSIS — A4902 Methicillin resistant Staphylococcus aureus infection, unspecified site: Secondary | ICD-10-CM | POA: Diagnosis present

## 2015-12-14 DIAGNOSIS — I959 Hypotension, unspecified: Secondary | ICD-10-CM | POA: Diagnosis present

## 2015-12-14 DIAGNOSIS — R627 Adult failure to thrive: Secondary | ICD-10-CM | POA: Diagnosis present

## 2015-12-14 DIAGNOSIS — I4891 Unspecified atrial fibrillation: Secondary | ICD-10-CM | POA: Diagnosis present

## 2015-12-14 DIAGNOSIS — I480 Paroxysmal atrial fibrillation: Secondary | ICD-10-CM

## 2015-12-14 DIAGNOSIS — R918 Other nonspecific abnormal finding of lung field: Secondary | ICD-10-CM | POA: Diagnosis not present

## 2015-12-14 DIAGNOSIS — B9561 Methicillin susceptible Staphylococcus aureus infection as the cause of diseases classified elsewhere: Secondary | ICD-10-CM | POA: Diagnosis present

## 2015-12-14 DIAGNOSIS — G8220 Paraplegia, unspecified: Secondary | ICD-10-CM | POA: Diagnosis not present

## 2015-12-14 DIAGNOSIS — R2689 Other abnormalities of gait and mobility: Secondary | ICD-10-CM | POA: Diagnosis not present

## 2015-12-14 DIAGNOSIS — E785 Hyperlipidemia, unspecified: Secondary | ICD-10-CM | POA: Diagnosis present

## 2015-12-14 DIAGNOSIS — N319 Neuromuscular dysfunction of bladder, unspecified: Secondary | ICD-10-CM

## 2015-12-14 DIAGNOSIS — S22069S Unspecified fracture of T7-T8 vertebra, sequela: Secondary | ICD-10-CM | POA: Diagnosis not present

## 2015-12-14 DIAGNOSIS — R0602 Shortness of breath: Secondary | ICD-10-CM

## 2015-12-14 DIAGNOSIS — M4624 Osteomyelitis of vertebra, thoracic region: Secondary | ICD-10-CM | POA: Diagnosis not present

## 2015-12-14 DIAGNOSIS — G8929 Other chronic pain: Secondary | ICD-10-CM | POA: Diagnosis present

## 2015-12-14 DIAGNOSIS — G9511 Acute infarction of spinal cord (embolic) (nonembolic): Secondary | ICD-10-CM | POA: Diagnosis present

## 2015-12-14 DIAGNOSIS — G062 Extradural and subdural abscess, unspecified: Principal | ICD-10-CM | POA: Diagnosis present

## 2015-12-14 DIAGNOSIS — L89152 Pressure ulcer of sacral region, stage 2: Secondary | ICD-10-CM | POA: Diagnosis present

## 2015-12-14 DIAGNOSIS — K5900 Constipation, unspecified: Secondary | ICD-10-CM | POA: Diagnosis not present

## 2015-12-14 DIAGNOSIS — F419 Anxiety disorder, unspecified: Secondary | ICD-10-CM | POA: Diagnosis present

## 2015-12-14 DIAGNOSIS — I498 Other specified cardiac arrhythmias: Secondary | ICD-10-CM | POA: Diagnosis not present

## 2015-12-14 DIAGNOSIS — M869 Osteomyelitis, unspecified: Secondary | ICD-10-CM | POA: Diagnosis present

## 2015-12-14 DIAGNOSIS — S22069A Unspecified fracture of T7-T8 vertebra, initial encounter for closed fracture: Secondary | ICD-10-CM | POA: Diagnosis present

## 2015-12-14 DIAGNOSIS — E1165 Type 2 diabetes mellitus with hyperglycemia: Secondary | ICD-10-CM | POA: Diagnosis present

## 2015-12-14 DIAGNOSIS — A4101 Sepsis due to Methicillin susceptible Staphylococcus aureus: Secondary | ICD-10-CM | POA: Diagnosis present

## 2015-12-14 DIAGNOSIS — I10 Essential (primary) hypertension: Secondary | ICD-10-CM | POA: Diagnosis present

## 2015-12-14 DIAGNOSIS — E11622 Type 2 diabetes mellitus with other skin ulcer: Secondary | ICD-10-CM | POA: Diagnosis not present

## 2015-12-14 DIAGNOSIS — S24102A Unspecified injury at T2-T6 level of thoracic spinal cord, initial encounter: Secondary | ICD-10-CM

## 2015-12-14 DIAGNOSIS — W182XXA Fall in (into) shower or empty bathtub, initial encounter: Secondary | ICD-10-CM | POA: Diagnosis present

## 2015-12-14 DIAGNOSIS — D62 Acute posthemorrhagic anemia: Secondary | ICD-10-CM | POA: Diagnosis not present

## 2015-12-14 DIAGNOSIS — D72829 Elevated white blood cell count, unspecified: Secondary | ICD-10-CM | POA: Diagnosis not present

## 2015-12-14 DIAGNOSIS — R7881 Bacteremia: Secondary | ICD-10-CM | POA: Diagnosis present

## 2015-12-14 DIAGNOSIS — R29898 Other symptoms and signs involving the musculoskeletal system: Secondary | ICD-10-CM | POA: Diagnosis present

## 2015-12-14 DIAGNOSIS — K592 Neurogenic bowel, not elsewhere classified: Secondary | ICD-10-CM | POA: Diagnosis present

## 2015-12-14 DIAGNOSIS — N401 Enlarged prostate with lower urinary tract symptoms: Secondary | ICD-10-CM | POA: Diagnosis present

## 2015-12-14 DIAGNOSIS — G4700 Insomnia, unspecified: Secondary | ICD-10-CM | POA: Diagnosis present

## 2015-12-14 DIAGNOSIS — E7800 Pure hypercholesterolemia, unspecified: Secondary | ICD-10-CM | POA: Diagnosis not present

## 2015-12-14 DIAGNOSIS — G8222 Paraplegia, incomplete: Secondary | ICD-10-CM | POA: Diagnosis not present

## 2015-12-14 DIAGNOSIS — Y92002 Bathroom of unspecified non-institutional (private) residence single-family (private) house as the place of occurrence of the external cause: Secondary | ICD-10-CM | POA: Diagnosis not present

## 2015-12-14 DIAGNOSIS — I63232 Cerebral infarction due to unspecified occlusion or stenosis of left carotid arteries: Secondary | ICD-10-CM | POA: Diagnosis not present

## 2015-12-14 DIAGNOSIS — Z7902 Long term (current) use of antithrombotics/antiplatelets: Secondary | ICD-10-CM

## 2015-12-14 DIAGNOSIS — J189 Pneumonia, unspecified organism: Secondary | ICD-10-CM | POA: Diagnosis present

## 2015-12-14 DIAGNOSIS — M4850XS Collapsed vertebra, not elsewhere classified, site unspecified, sequela of fracture: Secondary | ICD-10-CM | POA: Diagnosis not present

## 2015-12-14 DIAGNOSIS — K921 Melena: Secondary | ICD-10-CM | POA: Diagnosis not present

## 2015-12-14 DIAGNOSIS — I82432 Acute embolism and thrombosis of left popliteal vein: Secondary | ICD-10-CM | POA: Diagnosis not present

## 2015-12-14 DIAGNOSIS — Z79899 Other long term (current) drug therapy: Secondary | ICD-10-CM

## 2015-12-14 DIAGNOSIS — I639 Cerebral infarction, unspecified: Secondary | ICD-10-CM | POA: Diagnosis not present

## 2015-12-14 DIAGNOSIS — Z981 Arthrodesis status: Secondary | ICD-10-CM

## 2015-12-14 DIAGNOSIS — I340 Nonrheumatic mitral (valve) insufficiency: Secondary | ICD-10-CM | POA: Diagnosis not present

## 2015-12-14 DIAGNOSIS — M7989 Other specified soft tissue disorders: Secondary | ICD-10-CM | POA: Diagnosis not present

## 2015-12-14 DIAGNOSIS — M899 Disorder of bone, unspecified: Secondary | ICD-10-CM | POA: Diagnosis not present

## 2015-12-14 DIAGNOSIS — L8990 Pressure ulcer of unspecified site, unspecified stage: Secondary | ICD-10-CM | POA: Diagnosis present

## 2015-12-14 DIAGNOSIS — A4901 Methicillin susceptible Staphylococcus aureus infection, unspecified site: Secondary | ICD-10-CM | POA: Diagnosis not present

## 2015-12-14 DIAGNOSIS — I6529 Occlusion and stenosis of unspecified carotid artery: Secondary | ICD-10-CM | POA: Diagnosis not present

## 2015-12-14 DIAGNOSIS — E1169 Type 2 diabetes mellitus with other specified complication: Secondary | ICD-10-CM | POA: Diagnosis not present

## 2015-12-14 DIAGNOSIS — I4892 Unspecified atrial flutter: Secondary | ICD-10-CM | POA: Diagnosis present

## 2015-12-14 DIAGNOSIS — M4850XA Collapsed vertebra, not elsewhere classified, site unspecified, initial encounter for fracture: Secondary | ICD-10-CM | POA: Diagnosis not present

## 2015-12-14 DIAGNOSIS — N359 Urethral stricture, unspecified: Secondary | ICD-10-CM | POA: Diagnosis present

## 2015-12-14 DIAGNOSIS — S22069D Unspecified fracture of T7-T8 vertebra, subsequent encounter for fracture with routine healing: Secondary | ICD-10-CM | POA: Diagnosis not present

## 2015-12-14 DIAGNOSIS — E119 Type 2 diabetes mellitus without complications: Secondary | ICD-10-CM

## 2015-12-14 DIAGNOSIS — I6523 Occlusion and stenosis of bilateral carotid arteries: Secondary | ICD-10-CM | POA: Diagnosis not present

## 2015-12-14 DIAGNOSIS — W19XXXA Unspecified fall, initial encounter: Secondary | ICD-10-CM | POA: Diagnosis present

## 2015-12-14 HISTORY — DX: Paraplegia, unspecified: G82.20

## 2015-12-14 HISTORY — DX: Sepsis due to methicillin susceptible Staphylococcus aureus: A41.01

## 2015-12-14 HISTORY — DX: Paroxysmal atrial fibrillation: I48.0

## 2015-12-14 LAB — CBC
HCT: 35.7 % — ABNORMAL LOW (ref 39.0–52.0)
Hemoglobin: 11.7 g/dL — ABNORMAL LOW (ref 13.0–17.0)
MCH: 30.5 pg (ref 26.0–34.0)
MCHC: 32.8 g/dL (ref 30.0–36.0)
MCV: 93.2 fL (ref 78.0–100.0)
PLATELETS: 291 10*3/uL (ref 150–400)
RBC: 3.83 MIL/uL — AB (ref 4.22–5.81)
RDW: 14.2 % (ref 11.5–15.5)
WBC: 15.9 10*3/uL — AB (ref 4.0–10.5)

## 2015-12-14 LAB — BASIC METABOLIC PANEL
Anion gap: 10 (ref 5–15)
BUN: 12 mg/dL (ref 6–20)
CHLORIDE: 93 mmol/L — AB (ref 101–111)
CO2: 29 mmol/L (ref 22–32)
CREATININE: 0.79 mg/dL (ref 0.61–1.24)
Calcium: 8.6 mg/dL — ABNORMAL LOW (ref 8.9–10.3)
GFR calc Af Amer: >60 mL/min (ref >60)
GLUCOSE: 124 mg/dL — AB (ref 65–99)
Potassium: 3.6 mmol/L (ref 3.5–5.1)
SODIUM: 132 mmol/L — AB (ref 135–145)

## 2015-12-14 LAB — I-STAT CG4 LACTIC ACID, ED
Lactic Acid, Venous: 1.07 mmol/L (ref 0.5–1.9)
Lactic Acid, Venous: 1.54 mmol/L (ref 0.5–1.9)

## 2015-12-14 LAB — URINALYSIS, ROUTINE W REFLEX MICROSCOPIC
Bilirubin Urine: NEGATIVE
Glucose, UA: NEGATIVE mg/dL
Ketones, ur: NEGATIVE mg/dL
Nitrite: POSITIVE — AB
PROTEIN: 100 mg/dL — AB
Specific Gravity, Urine: 1.02 (ref 1.005–1.030)
pH: 7 (ref 5.0–8.0)

## 2015-12-14 LAB — URINE MICROSCOPIC-ADD ON

## 2015-12-14 LAB — HEPATIC FUNCTION PANEL
ALBUMIN: 1.9 g/dL — AB (ref 3.5–5.0)
ALT: 28 U/L (ref 17–63)
AST: 44 U/L — ABNORMAL HIGH (ref 15–41)
Alkaline Phosphatase: 183 U/L — ABNORMAL HIGH (ref 38–126)
BILIRUBIN TOTAL: 0.7 mg/dL (ref 0.3–1.2)
Bilirubin, Direct: 0.1 mg/dL (ref 0.1–0.5)
Indirect Bilirubin: 0.6 mg/dL (ref 0.3–0.9)
Total Protein: 7.9 g/dL (ref 6.5–8.1)

## 2015-12-14 LAB — I-STAT TROPONIN, ED: TROPONIN I, POC: 0 ng/mL (ref 0.00–0.08)

## 2015-12-14 LAB — TSH: TSH: 0.469 u[IU]/mL (ref 0.350–4.500)

## 2015-12-14 LAB — CBG MONITORING, ED: GLUCOSE-CAPILLARY: 138 mg/dL — AB (ref 65–99)

## 2015-12-14 LAB — PROTIME-INR
INR: 1.04
Prothrombin Time: 13.7 seconds (ref 11.4–15.2)

## 2015-12-14 LAB — BRAIN NATRIURETIC PEPTIDE: B Natriuretic Peptide: 227.5 pg/mL — ABNORMAL HIGH (ref 0.0–100.0)

## 2015-12-14 MED ORDER — ACETAMINOPHEN 325 MG PO TABS
650.0000 mg | ORAL_TABLET | ORAL | Status: DC | PRN
  Administered 2015-12-15 – 2015-12-16 (×3): 650 mg via ORAL
  Filled 2015-12-14 (×3): qty 2

## 2015-12-14 MED ORDER — FENTANYL CITRATE (PF) 100 MCG/2ML IJ SOLN
50.0000 ug | Freq: Once | INTRAMUSCULAR | Status: AC
  Administered 2015-12-14: 50 ug via INTRAVENOUS
  Filled 2015-12-14: qty 2

## 2015-12-14 MED ORDER — METOPROLOL TARTRATE 5 MG/5ML IV SOLN
5.0000 mg | Freq: Once | INTRAVENOUS | Status: AC
  Administered 2015-12-14: 5 mg via INTRAVENOUS
  Filled 2015-12-14: qty 5

## 2015-12-14 MED ORDER — SODIUM CHLORIDE 0.9 % IV SOLN
Freq: Once | INTRAVENOUS | Status: AC
  Administered 2015-12-14: 50 mL/h via INTRAVENOUS

## 2015-12-14 MED ORDER — SODIUM CHLORIDE 0.9 % IV BOLUS (SEPSIS)
500.0000 mL | Freq: Once | INTRAVENOUS | Status: AC
  Administered 2015-12-14: 500 mL via INTRAVENOUS

## 2015-12-14 MED ORDER — DEXTROSE 5 % IV SOLN
1.0000 g | Freq: Once | INTRAVENOUS | Status: AC
  Administered 2015-12-14: 1 g via INTRAVENOUS
  Filled 2015-12-14: qty 10

## 2015-12-14 MED ORDER — FENTANYL CITRATE (PF) 100 MCG/2ML IJ SOLN
INTRAMUSCULAR | Status: AC
  Filled 2015-12-14: qty 2

## 2015-12-14 MED ORDER — FENTANYL CITRATE (PF) 100 MCG/2ML IJ SOLN
50.0000 ug | Freq: Once | INTRAMUSCULAR | Status: AC
  Administered 2015-12-14: 50 ug via INTRAVENOUS

## 2015-12-14 MED ORDER — AZITHROMYCIN 250 MG PO TABS
500.0000 mg | ORAL_TABLET | ORAL | Status: DC
  Administered 2015-12-15: 500 mg via ORAL
  Filled 2015-12-14: qty 2

## 2015-12-14 MED ORDER — DILTIAZEM HCL-DEXTROSE 100-5 MG/100ML-% IV SOLN (PREMIX)
5.0000 mg/h | INTRAVENOUS | Status: DC
  Administered 2015-12-14 – 2015-12-15 (×3): 15 mg/h via INTRAVENOUS
  Filled 2015-12-14 (×2): qty 100

## 2015-12-14 MED ORDER — SODIUM CHLORIDE 0.9 % IV BOLUS (SEPSIS)
1000.0000 mL | Freq: Once | INTRAVENOUS | Status: AC
  Administered 2015-12-14: 1000 mL via INTRAVENOUS

## 2015-12-14 MED ORDER — ONDANSETRON HCL 4 MG/2ML IJ SOLN
4.0000 mg | Freq: Once | INTRAMUSCULAR | Status: AC
  Administered 2015-12-14: 4 mg via INTRAVENOUS

## 2015-12-14 MED ORDER — DEXTROSE 5 % IV SOLN
500.0000 mg | Freq: Once | INTRAVENOUS | Status: AC
  Administered 2015-12-14: 500 mg via INTRAVENOUS
  Filled 2015-12-14: qty 500

## 2015-12-14 MED ORDER — DILTIAZEM HCL 100 MG IV SOLR
5.0000 mg/h | Freq: Once | INTRAVENOUS | Status: AC
  Administered 2015-12-14: 5 mg/h via INTRAVENOUS
  Filled 2015-12-14: qty 100

## 2015-12-14 MED ORDER — ONDANSETRON HCL 4 MG/2ML IJ SOLN
INTRAMUSCULAR | Status: AC
  Filled 2015-12-14: qty 2

## 2015-12-14 MED ORDER — BARIUM SULFATE 2.1 % PO SUSP
ORAL | Status: AC
  Administered 2015-12-14: 12:00:00 via OROMUCOSAL
  Filled 2015-12-14: qty 2

## 2015-12-14 MED ORDER — ONDANSETRON HCL 4 MG/2ML IJ SOLN: 4.0000 mg | Freq: Four times a day (QID) | INTRAMUSCULAR | Status: DC | PRN

## 2015-12-14 MED ORDER — DEXTROSE 5 % IV SOLN
1.0000 g | INTRAVENOUS | Status: DC
  Administered 2015-12-15: 1 g via INTRAVENOUS
  Filled 2015-12-14: qty 10

## 2015-12-14 NOTE — ED Notes (Addendum)
CT tech notified this RN of pt's HR at 180 at 1345 on return to the room.  This RN reviewed pt's trending HR and notified Dr Lita Mains who gave a verbal for 1000 mL NS.  While in the room pt's HR increased to 199, captured EKG which show's a-fib with RVR.  Notified Dr Lita Mains who gave verbal for Cardizem drip.

## 2015-12-14 NOTE — ED Notes (Signed)
Contacted CT regarding patient finishing contrast. They stated that they will be transporting patient around 1:15pm to CT because they have to wait a total of two hours.

## 2015-12-14 NOTE — ED Notes (Signed)
Pt unable to stand on own. Placed condom catheter in order to get a specimen and to reduce incontinence.

## 2015-12-14 NOTE — H&P (Signed)
History and Physical    Marcus [name] [id number] DOB: 09-02-1940 DOA: 12/14/2015  PCP: Jeanmarie Hubert, MD Patient coming from: home  Chief Complaint: generalized weakness/fall/pain  HPI: Marcus [name] is a 75 y.o. male with medical history significant for urethral stricture, hypertension, diabetes, TIA, GERD, BPH, tremor, since to the emergency Department chief complaint of generalized weakness failure to thrive recent fall. Initial evaluation reveals community-acquired pneumonia, right lower lobe lung mass, closed fracture of 7 thoracic vertebrae, urinary retention, A. fib with RVR.  Information is obtained from the patient and the wife and daughter who were at the bedside. They report he was recently diagnosed with shingles has been on gabapentin and finished a course of steroids as well as antiviral medication. Over the last 3 days he has demonstrated worsening generalized weakness. Today he was unable to get out of bed. Associated symptoms include worsening shortness of breath and pain with coughing or deep breathing. He denies chest pain palpitations headache dizziness syncope or near-syncope. He denies any abdominal pain nausea vomiting but does endorse decreased oral intake. He also reports decreased sensation in his lower extremities and inability to urinate. He has some residual right-sided weakness from previous stroke in the daughter thinks that this is worse. No reports of any lower extremity edema. He did have a fall several days ago and has complained of worsening chronic back pain since that time.    ED Course: In the emergency department he develops A. fib with RVR is provided with a Cardizem drip one dose of Lopressor with marginal improvement provided with pain medicine and urology passes a Foley catheter for urinary retention. IV antibiotics were initiated for community-acquired pneumonia. Blood pressure is labile but is not hypoxic  Review of Systems: As per HPI otherwise  10 point review of systems negative.   Ambulatory Status: Has been ambulating with a steady gait up until about a week ago  Past Medical History:  Diagnosis Date  . Anxiety state, unspecified   . Calculus of kidney   . 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 Complication of anesthesia    " I woke up in the recovery room with tube in throat and panicked."  . 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 Herpes zoster without mention of complication   . History of hiatal hernia   . Hypertrophy of prostate with urinary obstruction and other lower urinary tract symptoms (LUTS)   . Insomnia, unspecified   . Lumbago   . Other and unspecified hyperlipidemia   . Pneumonia    bronchial  . Reflux esophagitis   . Seasonal allergies   . Stroke ([place])   . TIA (transient ischemic attack)   . Type II or unspecified type diabetes mellitus without mention of complication, not stated as uncontrolled   . Unspecified essential hypertension   . Unspecified vitamin D deficiency   . Urethral stricture unspecified   . Urinary frequency     Past Surgical History:  Procedure Laterality Date  . CARDIAC CATHETERIZATION  1986   normal, Dr Lia Foyer  . ENDARTERECTOMY Left 06/08/2014   Procedure: LEFT CAROTID ARTERY ENDARTERECTOMY;  Surgeon: Rosetta Posner, MD;  Location: [facility];  Service: Vascular;  Laterality: Left;  . EYE SURGERY  2014   cataract  . EYE SURGERY  2013   Retina  . PATCH ANGIOPLASTY Left 06/08/2014   Procedure: WITH DACRON PATCH ANGIOPLASTY;  Surgeon: Rosetta Posner, MD;  Location: [facility];  Service: Vascular;  Laterality: Left;  . SPINAL FUSION  12/91,11/91   obtained bone from left lower leg  . stress thallium  1994    normal;  . stretching bladder  07/2007   Dr Risa Grill    Social History   Social History  . Marital status: Married    Spouse name: N/A  . Number of children: N/A  . Years of education: N/A   Occupational History  . Not on file.   Social History Main Topics  .  Smoking status: Former Smoker    Types: Cigarettes    Quit date: 01/01/1991  . Smokeless tobacco: Never Used     Comment: " Quit around 1986"  . Alcohol use No  . Drug use: No  . Sexual activity: Yes    Birth control/ protection: None   Other Topics Concern  . Not on file   Social History Narrative  . No narrative on file    Allergies  Allergen Reactions  . Codeine Other (See Comments)    constipation  . Demerol [Meperidine] Other (See Comments)    Drops blood pressure   . Contrast Media [Iodinated Diagnostic Agents] Nausea And Vomiting  . Gabapentin Other (See Comments)    Suicidal, depression  . Other Other (See Comments)    All narcotics make pt hallucinate    Family History  Problem Relation Age of Onset  . Diabetes Mother   . Cancer Mother   . Hypertension Father   . Stroke Father   . Alcohol abuse Brother     Prior to Admission medications   Medication Sig Start Date End Date Taking? Authorizing Provider  atorvastatin (LIPITOR) 40 MG tablet TAKE ONE TABLET BY MOUTH ONCE DAILY FOR CHOLESTEROL Patient taking differently: TAKE ONE TABLET BY MOUTH ONCE DAILY AT BEDTIME FOR CHOLESTEROL 11/20/15  Yes Estill Dooms, MD  Cholecalciferol (VITAMIN D3) 5000 units TABS Take 5,000 Units by mouth at bedtime.   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    Yes Historical Provider, MD  LORazepam (ATIVAN) 0.5 MG tablet Take 0.5 mg by mouth See admin instructions. Take 1 tablet (0.5 mg) by mouth every  morning, may also take 1 tablet 2 more times during the day as needed for anxiety 11/21/15  Yes Historical Provider, MD  nicotine polacrilex (COMMIT) 2 MG lozenge Take 2 mg by mouth every 2 (two) hours.    Yes Historical Provider, MD  ondansetron (ZOFRAN) 4 MG tablet Take 1 tablet (4 mg total) by mouth every 8 (eight) hours as needed for nausea or vomiting. 12/02/15  Yes Lauree Chandler, NP  timolol (BETIMOL) 0.5 % ophthalmic solution Place 1 drop into both eyes 2 (two) times daily.   Yes Historical Provider, MD  trimethoprim (TRIMPEX) 100 MG tablet Take 100 mg by mouth daily.   Yes Historical Provider, MD  acetaminophen (TYLENOL) 500 MG tablet Take 1,000 mg by mouth every 6 (six) hours as needed for mild pain or moderate pain.    Historical Provider, MD  methocarbamol (ROBAXIN) 500 MG tablet Take 1 tablet (500 mg total) by mouth every 6 (six) hours as needed for muscle spasms. Patient not taking: Reported on 12/14/2015 12/05/15   Estill Dooms, MD  traMADol (ULTRAM) 50 MG tablet Take 1 tablet (50 mg total) by mouth every 6 (six) hours as needed. Patient not taking: Reported on 12/14/2015 12/02/15   Gwenyth Allegra Tegeler, MD    Physical Exam: Vitals:   12/14/15 1700 12/14/15 1715 12/14/15 1730 12/14/15 1745  BP: 92/66 (!) 88/70 102/68 101/75  Pulse: (!) 141 96 84   Resp: 22 21 25 20   Temp: 100.2 F (37.9 C)     TempSrc: Oral     SpO2: 93% 93% 95%      General:  Appears Slightly anxious somewhat pale and moderately uncomfortable Eyes:  PERRL, EOMI, normal lids, iris ENT:  grossly normal hearing, lips & tongue, mmm Neck:  no LAD, masses or thyromegaly Cardiovascular:  Irregularly irregular no m/r/g. No LE edema.  Respiratory:  Respirations shallow breath sounds diminished and faint wheezing throughout no crackles Abdomen:  Distended August bowel sounds nontender no guarding or rebounding Skin:  no rash or induration seen on limited exam Musculoskeletal:  grossly normal tone BUE/BLE,  good ROM, no bony abnormality. Tenderness right flank Psychiatric:  grossly normal mood and affect, speech fluent and appropriate, AOx3 Neurologic:  Alert oriented to self and place only. Lower extremities decreased sensation decreased strength lateral grip 4 out of 5 attempts to follow commands  Labs on Admission: I have personally reviewed following labs and imaging studies  CBC:  Recent Labs Lab 12/14/15 0635  WBC 15.9*  HGB 11.7*  HCT 35.7*  MCV 93.2  PLT [id number]   Basic Metabolic Panel:  Recent Labs Lab 12/14/15 0635  NA 132*  K 3.6  CL 93*  CO2 29  GLUCOSE 124*  BUN 12  CREATININE 0.79  CALCIUM 8.6*   GFR: Estimated Creatinine Clearance: 91.7 mL/min (by C-G formula based on SCr of 0.79 mg/dL). Liver Function Tests:  Recent Labs Lab 12/14/15 0635  AST 44*  ALT 28  ALKPHOS 183*  BILITOT 0.7  PROT 7.9  ALBUMIN 1.9*   No results for input(s): LIPASE, AMYLASE in the last 168 hours. No results for input(s): AMMONIA in the last 168 hours. Coagulation Profile:  Recent Labs Lab 12/14/15 1721  INR 1.04   Cardiac Enzymes: No results for input(s): CKTOTAL, CKMB, CKMBINDEX, TROPONINI in the last 168 hours. BNP (last 3 results) No results for input(s): PROBNP in the last 8760 hours. HbA1C: No results for input(s): HGBA1C in the last 72 hours. CBG:  Recent Labs Lab 12/14/15 0638  GLUCAP 138*   Lipid Profile: No results for input(s): CHOL, HDL, LDLCALC, TRIG, CHOLHDL, LDLDIRECT in the last 72 hours. Thyroid Function Tests: No results for input(s): TSH, T4TOTAL, FREET4, T3FREE, THYROIDAB in the last 72 hours. Anemia Panel: No results for input(s): VITAMINB12, FOLATE, FERRITIN, TIBC, IRON, RETICCTPCT in the last 72 hours. Urine analysis:    Component Value Date/Time   COLORURINE YELLOW 12/01/2015 1821   APPEARANCEUR CLOUDY (A) 12/01/2015 1821   APPEARANCEUR Clear 12/20/2014 1041   LABSPEC 1.010 12/01/2015 1821   PHURINE 7.0 12/01/2015 1821   GLUCOSEU  NEGATIVE 12/01/2015 1821   HGBUR LARGE (A) 12/01/2015 1821   BILIRUBINUR NEGATIVE 12/01/2015 1821   BILIRUBINUR Negative 12/20/2014 [place] 12/01/2015 1821   PROTEINUR NEGATIVE 12/01/2015 1821   UROBILINOGEN 0.2 06/06/2014 1332   NITRITE NEGATIVE 12/01/2015  [place] (A) 12/01/2015 1821   LEUKOCYTESUR 3+ (A) 12/20/2014 1041    Creatinine Clearance: Estimated Creatinine Clearance: 91.7 mL/min (by C-G formula based on SCr of 0.79 mg/dL).  Sepsis Labs: @LABRCNTIP (procalcitonin:4,lacticidven:4) )No results found for this or any previous visit (from the past 240 hour(s)).   Radiological Exams on Admission: Ct Abdomen Pelvis Wo Contrast  Result Date: 12/14/2015 CLINICAL DATA:  Abdominal distention and pain. Shortness of breath. Decreased appetite. EXAM: CT CHEST, ABDOMEN AND PELVIS WITHOUT CONTRAST TECHNIQUE: Multidetector CT imaging of the chest, abdomen and pelvis was performed following the standard protocol without IV contrast. COMPARISON:  CT abdomen pelvis 12/01/2015 and CT chest 07/07/2013. FINDINGS: CT CHEST FINDINGS Cardiovascular: Atherosclerotic calcification of the arterial vasculature, including three-vessel involvement of the coronary arteries. Heart size normal. There is pericardial calcification, indicative of prior pericarditis. No pericardial effusion. Mediastinum/Nodes: No pathologically enlarged mediastinal or axillary lymph nodes. Hilar regions are difficult to definitively evaluate without IV contrast. Esophagus is grossly unremarkable. Lungs/Pleura: Mild centrilobular emphysema. Rounded masslike consolidation in the medial aspect of the superior segment right lower lobe measures approximately 4.7 x 4.9 cm, is new from 07/07/2013, has internal ossific/calcific debris and obscures the extrapleural fat plane. Soft tissue extends around the adjacent vertebral body into the left extrapleural space. Trace right pleural effusion. Lungs are otherwise  clear. Musculoskeletal: Lucency and destruction of the T7 vertebral body is seen with surrounding soft tissue. There is lysis within the anterior aspect of the T6 vertebral body as well. Airway is unremarkable. CT ABDOMEN PELVIS FINDINGS Hepatobiliary: Liver and gallbladder are unremarkable. No biliary ductal dilatation. Pancreas: Negative. Spleen: Negative. Adrenals/Urinary Tract: Adrenal glands are unremarkable. Inferior aspects of both kidneys are obscured by streak artifact from spinal hardware. Low-attenuation lesions in the left kidney measure up to 2.0 cm and are difficult to further characterize without post-contrast imaging. Ureters are decompressed. There are scattered bladder diverticulum. Stomach/Bowel: Stomach, small bowel, appendix and colon are unremarkable. Vascular/Lymphatic: Atherosclerotic calcification of the arterial vasculature without abdominal aortic aneurysm. Postoperative changes are seen at the aortic bifurcation. No pathologically enlarged lymph nodes. Reproductive: Prostate is slightly enlarged and calcified. Other: No free fluid.  Mesenteries and peritoneum are unremarkable. Musculoskeletal: Postoperative changes in the lumbar spine. No additional worrisome lytic or sclerotic lesions. Bone graft harvest sites in the iliac wings. IMPRESSION: 1. Masslike consolidation in the right lower lobe with invasion of the extrapleural fat, worrisome for primary bronchogenic carcinoma. Lymphoma and sarcoma are considered less likely. 2. Lytic destruction and compression of the T7 vertebral body with surrounding soft tissue, consistent with a pathologic fracture. Minimal involvement of the T6 vertebral body. 3. Trace right pleural effusion. 4. No evidence of primary malignancy or metastatic disease in the abdomen or pelvis. 5.  Aortic atherosclerosis (ICD10-170.0). Electronically Signed   By: Lorin Picket M.D.   On: 12/14/2015 14:21   Ct Head Wo Contrast  Result Date: 12/14/2015 CLINICAL  DATA:  Generalized weakness, headache and photosensitivity. Decreased appetite. Fall yesterday with loss of sensation in right leg, initial encounter. EXAM: CT HEAD WITHOUT CONTRAST TECHNIQUE: Contiguous axial images were obtained from the base of the skull through the vertex without intravenous contrast. COMPARISON:  None. FINDINGS: Brain: No evidence of an acute infarct, acute hemorrhage, mass lesion, mass effect or hydrocephalus. Small area of encephalomalacia in the left occipital lobe, stable. Atrophy. Periventricular low attenuation. Vascular: No hyperdense vessel or unexpected calcification. Skull: Normal. Negative for fracture or focal lesion. Sinuses/Orbits: No acute finding. Other: None.  IMPRESSION: 1. No acute intracranial abnormality. 2. Atrophy and chronic microvascular white matter ischemic changes. 3. Small area of encephalomalacia in the left occipital lobe. Electronically Signed   By: Lorin Picket M.D.   On: 12/14/2015 10:31   Ct Chest Wo Contrast  Result Date: 12/14/2015 CLINICAL DATA:  Abdominal distention and pain. Shortness of breath. Decreased appetite. EXAM: CT CHEST, ABDOMEN AND PELVIS WITHOUT CONTRAST TECHNIQUE: Multidetector CT imaging of the chest, abdomen and pelvis was performed following the standard protocol without IV contrast. COMPARISON:  CT abdomen pelvis 12/01/2015 and CT chest 07/07/2013. FINDINGS: CT CHEST FINDINGS Cardiovascular: Atherosclerotic calcification of the arterial vasculature, including three-vessel involvement of the coronary arteries. Heart size normal. There is pericardial calcification, indicative of prior pericarditis. No pericardial effusion. Mediastinum/Nodes: No pathologically enlarged mediastinal or axillary lymph nodes. Hilar regions are difficult to definitively evaluate without IV contrast. Esophagus is grossly unremarkable. Lungs/Pleura: Mild centrilobular emphysema. Rounded masslike consolidation in the medial aspect of the superior segment  right lower lobe measures approximately 4.7 x 4.9 cm, is new from 07/07/2013, has internal ossific/calcific debris and obscures the extrapleural fat plane. Soft tissue extends around the adjacent vertebral body into the left extrapleural space. Trace right pleural effusion. Lungs are otherwise clear. Musculoskeletal: Lucency and destruction of the T7 vertebral body is seen with surrounding soft tissue. There is lysis within the anterior aspect of the T6 vertebral body as well. Airway is unremarkable. CT ABDOMEN PELVIS FINDINGS Hepatobiliary: Liver and gallbladder are unremarkable. No biliary ductal dilatation. Pancreas: Negative. Spleen: Negative. Adrenals/Urinary Tract: Adrenal glands are unremarkable. Inferior aspects of both kidneys are obscured by streak artifact from spinal hardware. Low-attenuation lesions in the left kidney measure up to 2.0 cm and are difficult to further characterize without post-contrast imaging. Ureters are decompressed. There are scattered bladder diverticulum. Stomach/Bowel: Stomach, small bowel, appendix and colon are unremarkable. Vascular/Lymphatic: Atherosclerotic calcification of the arterial vasculature without abdominal aortic aneurysm. Postoperative changes are seen at the aortic bifurcation. No pathologically enlarged lymph nodes. Reproductive: Prostate is slightly enlarged and calcified. Other: No free fluid.  Mesenteries and peritoneum are unremarkable. Musculoskeletal: Postoperative changes in the lumbar spine. No additional worrisome lytic or sclerotic lesions. Bone graft harvest sites in the iliac wings. IMPRESSION: 1. Masslike consolidation in the right lower lobe with invasion of the extrapleural fat, worrisome for primary bronchogenic carcinoma. Lymphoma and sarcoma are considered less likely. 2. Lytic destruction and compression of the T7 vertebral body with surrounding soft tissue, consistent with a pathologic fracture. Minimal involvement of the T6 vertebral body. 3.  Trace right pleural effusion. 4. No evidence of primary malignancy or metastatic disease in the abdomen or pelvis. 5.  Aortic atherosclerosis (ICD10-170.0). Electronically Signed   By: Lorin Picket M.D.   On: 12/14/2015 14:21   Dg Chest Port 1 View  Result Date: 12/14/2015 CLINICAL DATA:  75 year old male with a history of chest pain EXAM: PORTABLE CHEST 1 VIEW COMPARISON:  11/28/2015 FINDINGS: Double density in the right hilum, new from the comparison. No pleural effusion or pneumothorax. No confluent airspace disease. No displaced fracture. IMPRESSION: Double density in the right hilum, new from the comparison plain film in September. Given the rapid development, this may reflect a developing infection in the medial lung, however, contrast-enhanced CT is recommended to rule out adenopathy or mass. Signed, Dulcy Fanny. Earleen Newport, DO [facility] Electronically Signed   By: Corrie Mckusick D.O.   On: 12/14/2015 09:28    EKG: Independently reviewed. Atrial  fibrillation with rapid V-rate Ventricular premature complex Abnormal R-wave progression, early transition Repolarization abnormality, prob rate related  Assessment/Plan Principal Problem:   Atrial fibrillation with rapid ventricular response (HCC) Active Problems:   Essential hypertension   Carotid stenosis   Diabetes (HCC)   HLD (hyperlipidemia)   CVA (cerebral vascular accident) ([place])   Fall   CAP (community acquired pneumonia)   Compression fracture of vertebra ([place])   Urinary retention   New onset a-fib ([place])   Right lower lobe lung mass   Lower extremity weakness   #1. A. fib with rapid ventricular response. New-onset. chadvasc scor 5. Likely related to community-acquired pneumonia in the setting of possible urinary tract infection. Initial troponin negative. EKG as noted above. ENT: Mildly elevated. Cardizem drip initiated after a Cardizem bolus with little impact. Was given a  one-time dose metoprolol heart rate became controllled -Admit to step down -Continue Cardizem drip and titrate for rate and blood pressure -Continue antibiotics -Hold off on anticoagulation now as he will likely have procedure via interventional radiology tomorrow -Echocardiogram -We'll await cardiology recommendations  #2. Community-acquired pneumonia. Temp 100.2, WBCs 15. CT chest with masslike consolidation right lower lobe. lactic acid within the limits of normal -Follow blood cultures -Antibiotics per protocol -Oxygen supplementation as indicated -Tylenol for fever  [email or web address] T7. Patient fell in shower 3 days ago. Since starting for a metastatic malignancy. Lower extremities with decreased strength and decreased sensation. -Pain management -Physical therapy  4. Right lower lobe lung mass. CT masslike consolidation in the right lower lobe with invasion of the extrapleural fat, worrisome for primary bronchogenic carcinoma. -Interventional radiology consult for tomorrow requested  #5. Diabetes type 2. Diet controlled. Serum glucose 124 on admission. -Monitor  #.6 Hypertension. Patient quite hypotensive in the emergency department. Home medications include no antihypertensives -Monitor closely -See #1  #7. Urinary retention. Patient with a history of urethral stricture. Will avoid in the emergency department. Bedside bladder scan revealed greater than 500 mL urine. -Foley passed per urology -Monitor urine output  #8. History of stroke. Has some residual right-sided weakness. On Plavix. -Hold Plavix for now  #9. Lower extremity weakness. Some decreased sensation in exam. History of fusion 1991 per [name] -unable to get MRI due to hardware   DVT prophylaxis: scd  Code Status: full  Family Communication: wife and daughter  Disposition Plan: to be determined  Consults called: jordon cards, urology [place], IR wagner Admission status: inpatient   Radene Gunning  MD [facility]  If 7PM-7AM, please contact night-coverage [email or web address] Password [email or web address]  12/14/2015, 5:56 PM

## 2015-12-14 NOTE — ED Notes (Signed)
Cardiology and internal medicine in to see pt and family.

## 2015-12-14 NOTE — ED Notes (Signed)
Bladder Scan reads 539ml.

## 2015-12-14 NOTE — ED Notes (Signed)
Attempted report x1.  Name and callback number provided.   

## 2015-12-14 NOTE — Consult Note (Signed)
CARDIOLOGY CONSULT NOTE   Patient ID: PHI OLCZAK MRN: [id number] DOB/AGE: 1941/02/04 75 y.o.  Admit date: 12/14/2015  Requesting Physician: Primary Physician:   Jeanmarie Hubert, MD Primary Cardiologist:   None Reason for Consultation:  Afib w/ RVR  HPI: KIN TOTTY is a 75 y.o. male with a PMH significant for HTN, DM, TIA on clopidogrel, carotid disease s/p CEA, urethral stricture, and GERD who presented to the ED with several days of worsening malaise/fatigue, and a recent fall.  He was initially hemodynamically stable in the ED but later developed atrial fibrillation with a rapid ventricular response that was initially poorly controlled with Diltiazem gtt prompting cardiology consultation.  The pt subsequently converted back to NSR and has maintained this since.  He is currently laying comfortably in bed and has no acute complaints.  He denies any previous known episodes of atrial fibrillation or arrhthymia otherwise.  He also denies recent presyncopal or syncopal events, light-headedness, dizziness, palpitations, or sensation of a racing heart.  Initial ED w/u is concerning for a fever with Tm=100.2, leukocytosis, and consolidation on chest imaging concerning for CAP.  Additionally, lung imaging also demonstrated a RLL mass c/f malignancy, and the pt is now s/p foley catheter placement by urology given urethral stricture and subsequent urinary retention.  He also has a spinal lytic lesion and fracture of a thoracic vertebrae.    Past Medical History:  Diagnosis Date  . Anxiety state, unspecified   . Calculus of kidney   . 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 Complication of anesthesia    " I woke up in the recovery room with tube in throat and panicked."  . 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 Herpes zoster without mention of complication   . History of hiatal hernia   . Hypertrophy of prostate with urinary obstruction and other lower urinary tract symptoms  (LUTS)   . Insomnia, unspecified   . Lumbago   . Other and unspecified hyperlipidemia   . Pneumonia    bronchial  . Reflux esophagitis   . Seasonal allergies   . Stroke ([place])   . TIA (transient ischemic attack)   . Type II or unspecified type diabetes mellitus without mention of complication, not stated as uncontrolled   . Unspecified essential hypertension   . Unspecified vitamin D deficiency   . Urethral stricture unspecified   . Urinary frequency      Past Surgical History:  Procedure Laterality Date  . CARDIAC CATHETERIZATION  1986   normal, Dr Lia Foyer  . ENDARTERECTOMY Left 06/08/2014   Procedure: LEFT CAROTID ARTERY ENDARTERECTOMY;  Surgeon: Rosetta Posner, MD;  Location: [facility];  Service: Vascular;  Laterality: Left;  . EYE SURGERY  2014   cataract  . EYE SURGERY  2013   Retina  . PATCH ANGIOPLASTY Left 06/08/2014   Procedure: WITH DACRON PATCH ANGIOPLASTY;  Surgeon: Rosetta Posner, MD;  Location: [facility];  Service: Vascular;  Laterality: Left;  . SPINAL FUSION  12/91,11/91   obtained bone from left lower leg  . stress thallium  1994    normal;  . stretching bladder  07/2007   Dr Risa Grill    Allergies  Allergen Reactions  . Codeine Other (See Comments)    constipation  . Demerol [Meperidine] Other (See Comments)    Drops blood pressure   . Contrast Media [Iodinated Diagnostic Agents] Nausea And Vomiting  . Gabapentin Other (See Comments)  Suicidal, depression  . Other Other (See Comments)    All narcotics make pt hallucinate    I have reviewed the patient's current medications . azithromycin  500 mg Oral Q24H   . cefTRIAXone (ROCEPHIN)  IV    . diltiazem (CARDIZEM) infusion 15 mg/hr (12/14/15 1932)   acetaminophen, ondansetron (ZOFRAN) IV  Prior to Admission medications   Medication Sig Start Date End Date Taking? Authorizing Provider  atorvastatin (LIPITOR) 40 MG tablet TAKE ONE TABLET BY MOUTH ONCE DAILY FOR CHOLESTEROL Patient taking differently: TAKE ONE  TABLET BY MOUTH ONCE DAILY AT BEDTIME FOR CHOLESTEROL 11/20/15  Yes Estill Dooms, MD  Cholecalciferol (VITAMIN D3) 5000 units TABS Take 5,000 Units by mouth at bedtime.   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    Yes Historical Provider, MD  LORazepam (ATIVAN) 0.5 MG tablet Take 0.5 mg by mouth See admin instructions. Take 1 tablet (0.5 mg) by mouth every morning, may also take 1 tablet 2 more times during the day as needed for anxiety 11/21/15  Yes Historical Provider, MD  nicotine polacrilex (COMMIT) 2 MG lozenge Take 2 mg by mouth every 2 (two) hours.    Yes Historical Provider, MD  ondansetron (ZOFRAN) 4 MG tablet Take 1 tablet (4 mg total) by mouth every 8 (eight) hours as needed for nausea or vomiting. 12/02/15  Yes Lauree Chandler, NP  timolol (BETIMOL) 0.5 % ophthalmic solution Place 1 drop into both eyes 2 (two) times daily.   Yes Historical Provider, MD  trimethoprim (TRIMPEX) 100 MG tablet Take 100 mg by mouth daily.   Yes Historical Provider, MD  acetaminophen (TYLENOL) 500 MG tablet Take 1,000 mg by mouth every 6 (six) hours as needed for mild pain or moderate pain.    Historical Provider, MD  methocarbamol (ROBAXIN) 500 MG tablet Take 1 tablet (500 mg total) by mouth every 6 (six) hours as needed for muscle spasms. Patient not taking: Reported on 12/14/2015 12/05/15   Estill Dooms, MD  traMADol (ULTRAM) 50 MG tablet Take 1 tablet (50 mg total) by mouth every 6 (six) hours as needed. Patient not taking:  Reported on 12/14/2015 12/02/15   Courtney Paris, MD     Social History   Social History  . Marital status: Married    Spouse name: N/A  . Number of children: N/A  . Years of education: N/A   Occupational History  . Not on file.   Social History Main Topics  . Smoking status: Former Smoker    Types: Cigarettes    Quit date: 01/01/1991  . Smokeless tobacco: Never Used     Comment: " Quit around 1986"  . Alcohol use No  . Drug use: No  . Sexual activity: Yes    Birth control/ protection: None   Other Topics Concern  . Not on file   Social History Narrative  . No narrative on file    Family Status  Relation Status  . Mother Deceased  . Father  Deceased  . Brother Deceased  . Sister Alive  . Sister Alive  . Daughter Alive  . Son Alive  . Daughter Alive  . Daughter Alive   Family History  Problem Relation Age of Onset  . Diabetes Mother   . Cancer Mother   . Hypertension Father   . Stroke Father   . Alcohol abuse Brother     ROS:  Full 14 point review of systems complete and found to be negative unless listed above.  Physical Exam: Blood pressure 117/85, pulse 84, temperature 100.2 F (37.9 C), temperature source Oral, resp. rate 20, SpO2 95 %.  General: Well developed, well nourished, male in no acute distress Head: Eyes PERRLA, No xanthomas.   Normocephalic and atraumatic, oropharynx without edema or exudate.  Lungs: CTAB, no w/r/c  Heart: RRR, +S1 +S2, no appreciable m/r/g, no JVD, no LE edema   Neck: No carotid bruits. No lymphadenopathy.  JVD. Abdomen: Bowel sounds present, abdomen soft and non-tender without masses or hernias noted. Msk:  No spine or cva tenderness. No weakness, no joint deformities or effusions. Extremities: No clubbing or cyanosis.  edema.  Neuro: Alert and oriented X 3. No focal deficits noted. Psych:  Good affect, responds appropriately Skin: No rashes or lesions noted.  Labs:   Lab Results  Component Value Date   WBC  15.9 (H) 12/14/2015   HGB 11.7 (L) 12/14/2015   HCT 35.7 (L) 12/14/2015   MCV 93.2 12/14/2015   PLT 291 12/14/2015    Recent Labs  12/14/15 1721  INR 1.04    Recent Labs Lab 12/14/15 0635  NA 132*  K 3.6  CL 93*  CO2 29  BUN 12  CREATININE 0.79  CALCIUM 8.6*  PROT 7.9  BILITOT 0.7  ALKPHOS 183*  ALT 28  AST 44*  GLUCOSE 124*  ALBUMIN 1.9*   Magnesium  Date Value Ref Range Status  12/01/2015 2.0 1.7 - 2.4 mg/dL Final   No results for input(s): CKTOTAL, CKMB, TROPONINI in the last 72 hours.  Recent Labs  12/14/15 0904  TROPIPOC 0.00   Pro B Natriuretic peptide (BNP)  Date/Time Value Ref Range Status  07/07/2013 10:38 AM 442.6 (H) 0 - 125 pg/mL Final   Lab Results  Component Value Date   CHOL 101 (L) 09/16/2015   HDL 30 (L) 09/16/2015   LDLCALC 57 09/16/2015   TRIG 69 09/16/2015   Lab Results  Component Value Date   DDIMER 0.60 (H) 07/07/2013   Lipase  Date/Time Value Ref Range Status  12/01/2015 06:20 PM 19 11 - 51 U/L Final   Amylase  Date/Time Value Ref Range Status  06/21/2015 08:29 AM 129 (H) 31 - 124 U/L Final   TSH  Date/Time Value Ref Range Status  12/14/2015 05:07 PM 0.469 0.350 - 4.500 uIU/mL Final    Comment:    Performed by a 3rd Generation assay with a functional sensitivity of <=0.01 uIU/mL.  05/30/2015 12:28 PM 1.660 0.450 - 4.500 uIU/mL Final   No results found for: VITAMINB12, FOLATE, FERRITIN, TIBC, IRON, RETICCTPCT  Echo: 06/02/14 - Procedure narrative: Transthoracic echocardiography. Image quality was poor. The study was technically difficult, as a result of poor acoustic windows. - Left ventricle: The cavity size was normal. There was mild concentric hypertrophy. Systolic function was normal. The estimated ejection fraction was in the range of 60% to 65%.  Images were inadequate for LV wall motion assessment. Doppler parameters are consistent with abnormal left ventricular relaxation (grade 1 diastolic  dysfunction). -  Mitral valve: Mildly calcified annulus. Normal thickness leaflets - Left atrium: The atrium was mildly dilated. - Right atrium: The atrium was mildly dilated.  ECG:  Per my review, initial ECG demonstrates atrial fibrillation with rapid ventricular response, 1-22mm ST depressions in the lateral precordial leads, early R-S transition.  Later ECG demonstrates NSR with normal axis and normal intervals, resolution of mild ST changes, and no abnormality otherwise  Radiology:  Ct Abdomen Pelvis Wo Contrast  Result Date: 12/14/2015 CLINICAL DATA:  Abdominal distention and pain. Shortness of breath. Decreased appetite. EXAM: CT CHEST, ABDOMEN AND PELVIS WITHOUT CONTRAST TECHNIQUE: Multidetector CT imaging of the chest, abdomen and pelvis was performed following the standard protocol without IV contrast. COMPARISON:  CT abdomen pelvis 12/01/2015 and CT chest 07/07/2013. FINDINGS: CT CHEST FINDINGS Cardiovascular: Atherosclerotic calcification of the arterial vasculature, including three-vessel involvement of the coronary arteries. Heart size normal. There is pericardial calcification, indicative of prior pericarditis. No pericardial effusion. Mediastinum/Nodes: No pathologically enlarged mediastinal or axillary lymph nodes. Hilar regions are difficult to definitively evaluate without IV contrast. Esophagus is grossly unremarkable. Lungs/Pleura: Mild centrilobular emphysema. Rounded masslike consolidation in the medial aspect of the superior segment right lower lobe measures approximately 4.7 x 4.9 cm, is new from 07/07/2013, has internal ossific/calcific debris and obscures the extrapleural fat plane. Soft tissue extends around the adjacent vertebral body into the left extrapleural space. Trace right pleural effusion. Lungs are otherwise clear. Musculoskeletal: Lucency and destruction of the T7 vertebral body is seen with surrounding soft tissue. There is lysis within the anterior aspect of the T6 vertebral body as  well. Airway is unremarkable. CT ABDOMEN PELVIS FINDINGS Hepatobiliary: Liver and gallbladder are unremarkable. No biliary ductal dilatation. Pancreas: Negative. Spleen: Negative. Adrenals/Urinary Tract: Adrenal glands are unremarkable. Inferior aspects of both kidneys are obscured by streak artifact from spinal hardware. Low-attenuation lesions in the left kidney measure up to 2.0 cm and are difficult to further characterize without post-contrast imaging. Ureters are decompressed. There are scattered bladder diverticulum. Stomach/Bowel: Stomach, small bowel, appendix and colon are unremarkable. Vascular/Lymphatic: Atherosclerotic calcification of the arterial vasculature without abdominal aortic aneurysm. Postoperative changes are seen at the aortic bifurcation. No pathologically enlarged lymph nodes. Reproductive: Prostate is slightly enlarged and calcified. Other: No free fluid.  Mesenteries and peritoneum are unremarkable. Musculoskeletal: Postoperative changes in the lumbar spine. No additional worrisome lytic or sclerotic lesions. Bone graft harvest sites in the iliac wings. IMPRESSION: 1. Masslike consolidation in the right lower lobe with invasion of the extrapleural fat, worrisome for primary bronchogenic carcinoma. Lymphoma and sarcoma are considered less likely. 2. Lytic destruction and compression of the T7 vertebral body with surrounding soft tissue, consistent with a pathologic fracture. Minimal involvement of the T6 vertebral body. 3. Trace right pleural effusion. 4. No evidence of primary malignancy or metastatic disease in the abdomen or pelvis. 5.  Aortic atherosclerosis (ICD10-170.0). Electronically Signed   By: Lorin Picket M.D.   On: 12/14/2015 14:21   Ct Head Wo Contrast  Result Date: 12/14/2015 CLINICAL DATA:  Generalized weakness, headache and photosensitivity. Decreased appetite. Fall yesterday with loss of sensation in right leg, initial encounter. EXAM: CT HEAD WITHOUT CONTRAST  TECHNIQUE: Contiguous axial images were obtained from the base of the skull through the vertex without intravenous contrast. COMPARISON:  None. FINDINGS: Brain: No evidence of an acute infarct, acute hemorrhage, mass lesion, mass effect or hydrocephalus. Small area of encephalomalacia in the left occipital lobe, stable. Atrophy. Periventricular low attenuation. Vascular: No hyperdense  vessel or unexpected calcification. Skull: Normal. Negative for fracture or focal lesion. Sinuses/Orbits: No acute finding. Other: None. IMPRESSION: 1. No acute intracranial abnormality. 2. Atrophy and chronic microvascular white matter ischemic changes. 3. Small area of encephalomalacia in the left occipital lobe. Electronically Signed   By: Lorin Picket M.D.   On: 12/14/2015 10:31   Ct Chest Wo Contrast  Result Date: 12/14/2015 CLINICAL DATA:  Abdominal distention and pain. Shortness of breath. Decreased appetite. EXAM: CT CHEST, ABDOMEN AND PELVIS WITHOUT CONTRAST TECHNIQUE: Multidetector CT imaging of the chest, abdomen and pelvis was performed following the standard protocol without IV contrast. COMPARISON:  CT abdomen pelvis 12/01/2015 and CT chest 07/07/2013. FINDINGS: CT CHEST FINDINGS Cardiovascular: Atherosclerotic calcification of the arterial vasculature, including three-vessel involvement of the coronary arteries. Heart size normal. There is pericardial calcification, indicative of prior pericarditis. No pericardial effusion. Mediastinum/Nodes: No pathologically enlarged mediastinal or axillary lymph nodes. Hilar regions are difficult to definitively evaluate without IV contrast. Esophagus is grossly unremarkable. Lungs/Pleura: Mild centrilobular emphysema. Rounded masslike consolidation in the medial aspect of the superior segment right lower lobe measures approximately 4.7 x 4.9 cm, is new from 07/07/2013, has internal ossific/calcific debris and obscures the extrapleural fat plane. Soft tissue extends around the  adjacent vertebral body into the left extrapleural space. Trace right pleural effusion. Lungs are otherwise clear. Musculoskeletal: Lucency and destruction of the T7 vertebral body is seen with surrounding soft tissue. There is lysis within the anterior aspect of the T6 vertebral body as well. Airway is unremarkable. CT ABDOMEN PELVIS FINDINGS Hepatobiliary: Liver and gallbladder are unremarkable. No biliary ductal dilatation. Pancreas: Negative. Spleen: Negative. Adrenals/Urinary Tract: Adrenal glands are unremarkable. Inferior aspects of both kidneys are obscured by streak artifact from spinal hardware. Low-attenuation lesions in the left kidney measure up to 2.0 cm and are difficult to further characterize without post-contrast imaging. Ureters are decompressed. There are scattered bladder diverticulum. Stomach/Bowel: Stomach, small bowel, appendix and colon are unremarkable. Vascular/Lymphatic: Atherosclerotic calcification of the arterial vasculature without abdominal aortic aneurysm. Postoperative changes are seen at the aortic bifurcation. No pathologically enlarged lymph nodes. Reproductive: Prostate is slightly enlarged and calcified. Other: No free fluid.  Mesenteries and peritoneum are unremarkable. Musculoskeletal: Postoperative changes in the lumbar spine. No additional worrisome lytic or sclerotic lesions. Bone graft harvest sites in the iliac wings. IMPRESSION: 1. Masslike consolidation in the right lower lobe with invasion of the extrapleural fat, worrisome for primary bronchogenic carcinoma. Lymphoma and sarcoma are considered less likely. 2. Lytic destruction and compression of the T7 vertebral body with surrounding soft tissue, consistent with a pathologic fracture. Minimal involvement of the T6 vertebral body. 3. Trace right pleural effusion. 4. No evidence of primary malignancy or metastatic disease in the abdomen or pelvis. 5.  Aortic atherosclerosis (ICD10-170.0). Electronically Signed   By:  Lorin Picket M.D.   On: 12/14/2015 14:21   Dg Chest Port 1 View  Result Date: 12/14/2015 CLINICAL DATA:  75 year old male with a history of chest pain EXAM: PORTABLE CHEST 1 VIEW COMPARISON:  11/28/2015 FINDINGS: Double density in the right hilum, new from the comparison. No pleural effusion or pneumothorax. No confluent airspace disease. No displaced fracture. IMPRESSION: Double density in the right hilum, new from the comparison plain film in September. Given the rapid development, this may reflect a developing infection in the medial lung, however, contrast-enhanced CT is recommended to rule out adenopathy or mass. Signed, Dulcy Fanny. Earleen Newport, DO [facility] Radiology Electronically Signed  By: Corrie Mckusick D.O.   On: 12/14/2015 09:28    ASSESSMENT AND PLAN:    Principal Problem:   Atrial fibrillation with rapid ventricular response (HCC) Active Problems:   Essential hypertension   Carotid stenosis   Diabetes (HCC)   HLD (hyperlipidemia)   CVA (cerebral vascular accident) ([place])   Fall   CAP (community acquired pneumonia)   Compression fracture of vertebra ([place])   Urinary retention   New onset a-fib ([place])   Right lower lobe lung mass   Lower extremity weakness   RAJVIR BYRDSONG is a 75 y.o. male with a PMH significant for HTN, DM, TIA on clopidogrel, carotid disease s/p CEA, urethral stricture, and GERD who presented to the ED with several days of worsening malaise/fatigue, and a recent fall who then developed Afib with RVR in the ED.  He subsequently converted to NSR following uptitration of a diltiazem gtt and additional bolus dosing of Metoprolol.  He has maintained NSR since.  His Afib is likely driven by his underlying infectious process, rather than a primary arrhythmogenic event.   1) Atrial fibrillation with rapid ventricular response; hemodynamically stable and asymptomatic during period of RVR, subsequently converted to NSR. -  wean from diltiazem gtt as he is now in normal sinus rhythm - start low-dose oral beta-blockade with Metoprolol 12.5mg  PO BID as tolerated by BP.  Lower dose given concern for sepsis, can uptitrate as tolerated - continuous telemetry overnight - maintain Mg>2 and K>4 - repeat ECG if Afib recurs - TTE in am - defer systemic anticoagulation given concern for active lung malignancy  2) HTN; mild hypotension with diltiazem gtt was increased earlier in the evening, now resolved. - metoprolol as noted above  3) TIA; clopidogrel as per neurology recommendations, no indication for this medication from a cardiac perspective  4) CAP; management as per primary service  5) Potential lung malignancy; interventional radiology to see pt as per primary team  Signed: Clayborne Dana, MD 12/14/2015 8:06 PM

## 2015-12-14 NOTE — ED Notes (Signed)
spoke with Clayton Bibles PA-- orders received.

## 2015-12-14 NOTE — Consult Note (Signed)
New Urology Consult Note   Requesting Attending Physician:  Elwin Mocha, MD Service Providing Consult: Urology Consulting Attending: Junious Silk, MD  Assessment:  Patient is a 75 y.o. male with known urethral stricture disease presents today with newly diagnosed RLL primary lung lesion with pathologic T7 spinal fracture, bilateral lower extremity deficits, urinary retention in setting of urethral stricture disease. Urology was consulted for assistance with catheter placement. Complicated Foley catheter placement performed in the ER over a sensor wire with a 81WE silicone 2-way catheter. Also presents with A fib with RVR, temp 100.2, leukocytosis.   Recommendations: 1. Would recommend keeping urethral catheter in place for at least 3-5 days from urologic standpoint. If the patient is admitted for longer than 5 days would then defer to primary team on catheter management 2. Expect blood via urethral meatus secondary to ER procedure and urethral stricture dilation, this is normal.  3. Follow up urine & blood cultures, has history of MSSA positive urine cultures. Antibiotics per primary team  Thank you for this consult. Please contact the urology consult pager with any further questions/concerns. Sharmaine Base, MD Urology Surgical Resident   HPI -   Reason for Consult:  Urethral stricture, retention, requirement for Foley catheter  Marcus [name] is seen in consultation for reasons noted above at the request of Elwin Mocha, MD.  This is a 75 yo patient with a history of known urethral stricture disease requiring several dilations in the past. Carries a history of traumatic urethral Foley placement complicated by false passage. 2012 had balloon dilation of the urethra to 24Fr with Dr. Risa Grill, had a stone in the prostatic urethra. Urethral stricture classically described in the proximal penile vs bulbar urethra. Has previously been counseled on urethroplasty. Most recently seen in the  [facility] last month with right flank pain, no hydronephrosis on CT scan nor stones. Lungs not completely imaged nor was T spine. 18cc in bladder on PVR.  He unfortunately presents today with what appears to be a pathologic T7 spinal fracture, adjacent to a new diagnosis of a primary lung lesion. He is not entirely reliable in providing history to me today. At one point while we are alone in the room he points his hand to the ceiling and tells me that he's trying to turn the TV on. He's accompanied by his wife & daughters. He last voided at his baseline about 2 days ago. He fell 2 days ago in the bathroom and had to crawl out on carpet, suffering rug burn wounds to his bilateral knees. He reportedly hasn't voided hardly at all except for what is being described as dribbling overflow incontinence since yesterday. No gross hematuria or dysuria. Has had shooting burning pains around the abdomen that track upwards to the epigastrium that do not seem consistent with bladder distention pain (possibly neuropathic pain in dermatome from T7 fracture?).   He has had back pain with SOB, most concerning symptoms to him that brought him to ER.  Denies fecal incontinence. Passing flatus during my visit. Cannot move his legs bilaterally but can wiggle his feet. A bladder scan was performed by the nursing staff with about 600cc in the bladder noted. A 81f catheter was attempted by RN staff but unsuccessful in the Er. Urology was called for assistance with catheter placement.   He arrives with a fever of 100.2, in significant atrial fibrillation with RVR and hypotension. Cr 0.79. WBC 15.9. Blood & urine cultures pending. CT C/A/P shows new RLL  lung lesion measuring 4.9 x 4.7cm with T7 fracture, no evidence of disease in A/P, calcified enlarged prostate, bladder tics, and what appears to be a stone(?) in the mid urethra.  Otherwise carries a history significant for nephrolithiasis, shingles, BPH LUTS, CVA/TIA,  HTN.  Past Medical History: Past Medical History:  Diagnosis Date  . Anxiety state, unspecified   . Calculus of kidney   . 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 Complication of anesthesia    " I woke up in the recovery room with tube in throat and panicked."  . 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 Herpes zoster without mention of complication   . History of hiatal hernia   . Hypertrophy of prostate with urinary obstruction and other lower urinary tract symptoms (LUTS)   . Insomnia, unspecified   . Lumbago   . Other and unspecified hyperlipidemia   . Pneumonia    bronchial  . Reflux esophagitis   . Seasonal allergies   . Stroke ([place])   . TIA (transient ischemic attack)   . Type II or unspecified type diabetes mellitus without mention of complication, not stated as uncontrolled   . Unspecified essential hypertension   . Unspecified vitamin D deficiency   . Urethral stricture unspecified   . Urinary frequency     Past Surgical History:  Past Surgical History:  Procedure Laterality Date  . CARDIAC CATHETERIZATION  1986   normal, Dr Lia Foyer  . ENDARTERECTOMY Left 06/08/2014   Procedure: LEFT CAROTID ARTERY ENDARTERECTOMY;  Surgeon: Rosetta Posner, MD;  Location: [facility];  Service: Vascular;  Laterality: Left;  . EYE SURGERY  2014   cataract  . EYE SURGERY  2013   Retina  . PATCH ANGIOPLASTY Left 06/08/2014   Procedure: WITH DACRON PATCH ANGIOPLASTY;  Surgeon: Rosetta Posner, MD;  Location: [facility];  Service: Vascular;  Laterality: Left;  . SPINAL FUSION  12/91,11/91   obtained bone from left lower leg  . stress thallium  1994    normal;  . stretching bladder  07/2007   Dr Risa Grill    Medication: Current Facility-Administered Medications  Medication Dose Route Frequency Provider Last Rate Last Dose  . acetaminophen (TYLENOL) tablet 650 mg  650 mg Oral Q4H PRN Radene Gunning, NP      . azithromycin [facility]) tablet 500 mg  500 mg Oral Q24H Lezlie Octave Black, NP      .  cefTRIAXone (ROCEPHIN) 1 g in dextrose 5 % 50 mL IVPB  1 g Intravenous Q24H Lezlie Octave Black, NP      . diltiazem (CARDIZEM) 100 mg in dextrose 5% 192m (1 mg/mL) infusion  5-15 mg/hr Intravenous Titrated KRadene Gunning NP      . ondansetron ([facility] injection 4 mg  4 mg Intravenous Q6H PRN KRadene Gunning NP       Current Outpatient Prescriptions  Medication Sig Dispense Refill  . atorvastatin (LIPITOR) 40 MG tablet TAKE ONE TABLET BY MOUTH ONCE DAILY FOR CHOLESTEROL (Patient taking differently: TAKE ONE TABLET BY MOUTH ONCE DAILY AT BEDTIME FOR CHOLESTEROL) 30 tablet 3  . Cholecalciferol (VITAMIN D3) 5000 units TABS Take 5,000 Units by mouth at bedtime.    . clopidogrel (PLAVIX) 75 MG tablet TAKE 1 TABLET BY MOUTH DAILY 90 tablet 3  . 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 HYDROcodone-acetaminophen (NORCO/VICODIN) 5-325 MG tablet Take 0.5-1 tablets by mouth every 6 (six) hours as needed for moderate pain.     Marland Kitchen LORazepam (ATIVAN) 0.5 MG tablet Take 0.5 mg by mouth See admin instructions. Take 1 tablet (0.5 mg) by mouth every morning, may also take 1 tablet 2 more times during the day as needed for anxiety  0  . nicotine polacrilex (COMMIT) 2 MG lozenge Take 2 mg by mouth every 2 (two) hours.     . ondansetron (ZOFRAN) 4 MG tablet Take 1 tablet (4 mg total) by mouth every 8 (eight) hours as needed for nausea or vomiting. 20 tablet 0  . timolol (BETIMOL) 0.5 % ophthalmic solution Place 1 drop into both eyes 2 (two) times daily.    Marland Kitchen trimethoprim (TRIMPEX) 100 MG tablet Take 100 mg by mouth daily.    Marland Kitchen acetaminophen (TYLENOL) 500 MG tablet Take 1,000 mg by mouth every 6 (six) hours as needed for mild pain or moderate pain.    . methocarbamol (ROBAXIN) 500 MG tablet Take 1 tablet (500 mg  total) by mouth every 6 (six) hours as needed for muscle spasms. (Patient not taking: Reported on 12/14/2015) 120 tablet 0  . traMADol (ULTRAM) 50 MG tablet Take 1 tablet (50 mg total) by mouth every 6 (six) hours as needed. (Patient not taking: Reported on 12/14/2015) 15 tablet 0    Allergies: Allergies  Allergen Reactions  . Codeine Other (See Comments)    constipation  . Demerol [Meperidine] Other (See Comments)    Drops blood pressure   . Contrast Media [Iodinated Diagnostic Agents] Nausea And Vomiting  . Gabapentin Other (See Comments)    Suicidal, depression  . Other Other (See Comments)    All narcotics make pt hallucinate    Social History: Social History  Substance Use Topics  . Smoking status: Former Smoker    Types: Cigarettes    Quit date: 01/01/1991  . Smokeless tobacco: Never Used     Comment: " Quit around 1986"  . Alcohol use No    Family History Family History  Problem Relation Age of Onset  . Diabetes Mother   . Cancer Mother   . Hypertension Father   . Stroke Father   . Alcohol abuse Brother     Review of Systems 10 systems were reviewed and are negative except as noted specifically in the HPI.  Objective   Vital signs in last 24 hours: BP 110/85   Pulse (!) 155   Temp (S) 100.2 F (37.9 C) (Oral)   Resp 20   SpO2 94%   Intake/Output last 3 shifts: No intake/output data recorded.  Physical Exam General: lying in bed, malodorous, alert but appears to be hallucinating at times (?) HEENT: [place]/AT, EOMI, MMM Pulmonary: Normal work of breathing on RA Cardiovascular: irregularly irregular rhythm, adequate peripheral perfusion Abdomen: protuberant, soft, nondistended, no suprapubic TTP or fullness GU: uncircumcised male phallus, no blood via urethral meatus on arrival, purple colored scrotal skin coloration, left testicle descended and appears normal, right testicle larger than left testicle but no masses palpable; Foley placed as below with  golden urine and expected blood at urethral meatus post Foley placement DRE: attempted but aborted due to T spine fracture and concern for position changes in bed Extremities: warm and well perfused, no edema; bilateral knee lacerations/abrasions noted  Most Recent Labs: Lab Results  Component Value Date  WBC 15.9 (H) 12/14/2015   HGB 11.7 (L) 12/14/2015   HCT 35.7 (L) 12/14/2015   PLT 291 12/14/2015    Lab Results  Component Value Date   NA 132 (L) 12/14/2015   K 3.6 12/14/2015   CL 93 (L) 12/14/2015   CO2 29 12/14/2015   BUN 12 12/14/2015   CREATININE 0.79 12/14/2015   CALCIUM 8.6 (L) 12/14/2015   MG 2.0 12/01/2015    Lab Results  Component Value Date   ALKPHOS 183 (H) 12/14/2015   BILITOT 0.7 12/14/2015   BILIDIR 0.1 12/14/2015   PROT 7.9 12/14/2015   ALBUMIN 1.9 (L) 12/14/2015   ALT 28 12/14/2015   AST 44 (H) 12/14/2015    Lab Results  Component Value Date   INR 1.17 06/01/2014   APTT 32 06/06/2014     Urine Culture: Pending   IMAGING: Ct Abdomen Pelvis Wo Contrast  Result Date: 12/14/2015 CLINICAL DATA:  Abdominal distention and pain. Shortness of breath. Decreased appetite. EXAM: CT CHEST, ABDOMEN AND PELVIS WITHOUT CONTRAST TECHNIQUE: Multidetector CT imaging of the chest, abdomen and pelvis was performed following the standard protocol without IV contrast. COMPARISON:  CT abdomen pelvis 12/01/2015 and CT chest 07/07/2013. FINDINGS: CT CHEST FINDINGS Cardiovascular: Atherosclerotic calcification of the arterial vasculature, including three-vessel involvement of the coronary arteries. Heart size normal. There is pericardial calcification, indicative of prior pericarditis. No pericardial effusion. Mediastinum/Nodes: No pathologically enlarged mediastinal or axillary lymph nodes. Hilar regions are difficult to definitively evaluate without IV contrast. Esophagus is grossly unremarkable. Lungs/Pleura: Mild centrilobular emphysema. Rounded masslike consolidation  in the medial aspect of the superior segment right lower lobe measures approximately 4.7 x 4.9 cm, is new from 07/07/2013, has internal ossific/calcific debris and obscures the extrapleural fat plane. Soft tissue extends around the adjacent vertebral body into the left extrapleural space. Trace right pleural effusion. Lungs are otherwise clear. Musculoskeletal: Lucency and destruction of the T7 vertebral body is seen with surrounding soft tissue. There is lysis within the anterior aspect of the T6 vertebral body as well. Airway is unremarkable. CT ABDOMEN PELVIS FINDINGS Hepatobiliary: Liver and gallbladder are unremarkable. No biliary ductal dilatation. Pancreas: Negative. Spleen: Negative. Adrenals/Urinary Tract: Adrenal glands are unremarkable. Inferior aspects of both kidneys are obscured by streak artifact from spinal hardware. Low-attenuation lesions in the left kidney measure up to 2.0 cm and are difficult to further characterize without post-contrast imaging. Ureters are decompressed. There are scattered bladder diverticulum. Stomach/Bowel: Stomach, small bowel, appendix and colon are unremarkable. Vascular/Lymphatic: Atherosclerotic calcification of the arterial vasculature without abdominal aortic aneurysm. Postoperative changes are seen at the aortic bifurcation. No pathologically enlarged lymph nodes. Reproductive: Prostate is slightly enlarged and calcified. Other: No free fluid.  Mesenteries and peritoneum are unremarkable. Musculoskeletal: Postoperative changes in the lumbar spine. No additional worrisome lytic or sclerotic lesions. Bone graft harvest sites in the iliac wings. IMPRESSION: 1. Masslike consolidation in the right lower lobe with invasion of the extrapleural fat, worrisome for primary bronchogenic carcinoma. Lymphoma and sarcoma are considered less likely. 2. Lytic destruction and compression of the T7 vertebral body with surrounding soft tissue, consistent with a pathologic fracture.  Minimal involvement of the T6 vertebral body. 3. Trace right pleural effusion. 4. No evidence of primary malignancy or metastatic disease in the abdomen or pelvis. 5.  Aortic atherosclerosis (ICD10-170.0). Electronically Signed   By: Lorin Picket M.D.   On: 12/14/2015 14:21   Ct Head Wo Contrast  Result Date: 12/14/2015 CLINICAL DATA:  Generalized weakness, headache and  photosensitivity. Decreased appetite. Fall yesterday with loss of sensation in right leg, initial encounter. EXAM: CT HEAD WITHOUT CONTRAST TECHNIQUE: Contiguous axial images were obtained from the base of the skull through the vertex without intravenous contrast. COMPARISON:  None. FINDINGS: Brain: No evidence of an acute infarct, acute hemorrhage, mass lesion, mass effect or hydrocephalus. Small area of encephalomalacia in the left occipital lobe, stable. Atrophy. Periventricular low attenuation. Vascular: No hyperdense vessel or unexpected calcification. Skull: Normal. Negative for fracture or focal lesion. Sinuses/Orbits: No acute finding. Other: None. IMPRESSION: 1. No acute intracranial abnormality. 2. Atrophy and chronic microvascular white matter ischemic changes. 3. Small area of encephalomalacia in the left occipital lobe. Electronically Signed   By: Lorin Picket M.D.   On: 12/14/2015 10:31   Ct Chest Wo Contrast  Result Date: 12/14/2015 CLINICAL DATA:  Abdominal distention and pain. Shortness of breath. Decreased appetite. EXAM: CT CHEST, ABDOMEN AND PELVIS WITHOUT CONTRAST TECHNIQUE: Multidetector CT imaging of the chest, abdomen and pelvis was performed following the standard protocol without IV contrast. COMPARISON:  CT abdomen pelvis 12/01/2015 and CT chest 07/07/2013. FINDINGS: CT CHEST FINDINGS Cardiovascular: Atherosclerotic calcification of the arterial vasculature, including three-vessel involvement of the coronary arteries. Heart size normal. There is pericardial calcification, indicative of prior pericarditis. No  pericardial effusion. Mediastinum/Nodes: No pathologically enlarged mediastinal or axillary lymph nodes. Hilar regions are difficult to definitively evaluate without IV contrast. Esophagus is grossly unremarkable. Lungs/Pleura: Mild centrilobular emphysema. Rounded masslike consolidation in the medial aspect of the superior segment right lower lobe measures approximately 4.7 x 4.9 cm, is new from 07/07/2013, has internal ossific/calcific debris and obscures the extrapleural fat plane. Soft tissue extends around the adjacent vertebral body into the left extrapleural space. Trace right pleural effusion. Lungs are otherwise clear. Musculoskeletal: Lucency and destruction of the T7 vertebral body is seen with surrounding soft tissue. There is lysis within the anterior aspect of the T6 vertebral body as well. Airway is unremarkable. CT ABDOMEN PELVIS FINDINGS Hepatobiliary: Liver and gallbladder are unremarkable. No biliary ductal dilatation. Pancreas: Negative. Spleen: Negative. Adrenals/Urinary Tract: Adrenal glands are unremarkable. Inferior aspects of both kidneys are obscured by streak artifact from spinal hardware. Low-attenuation lesions in the left kidney measure up to 2.0 cm and are difficult to further characterize without post-contrast imaging. Ureters are decompressed. There are scattered bladder diverticulum. Stomach/Bowel: Stomach, small bowel, appendix and colon are unremarkable. Vascular/Lymphatic: Atherosclerotic calcification of the arterial vasculature without abdominal aortic aneurysm. Postoperative changes are seen at the aortic bifurcation. No pathologically enlarged lymph nodes. Reproductive: Prostate is slightly enlarged and calcified. Other: No free fluid.  Mesenteries and peritoneum are unremarkable. Musculoskeletal: Postoperative changes in the lumbar spine. No additional worrisome lytic or sclerotic lesions. Bone graft harvest sites in the iliac wings. IMPRESSION: 1. Masslike consolidation in  the right lower lobe with invasion of the extrapleural fat, worrisome for primary bronchogenic carcinoma. Lymphoma and sarcoma are considered less likely. 2. Lytic destruction and compression of the T7 vertebral body with surrounding soft tissue, consistent with a pathologic fracture. Minimal involvement of the T6 vertebral body. 3. Trace right pleural effusion. 4. No evidence of primary malignancy or metastatic disease in the abdomen or pelvis. 5.  Aortic atherosclerosis (ICD10-170.0). Electronically Signed   By: Lorin Picket M.D.   On: 12/14/2015 14:21   Dg Chest Port 1 View  Result Date: 12/14/2015 CLINICAL DATA:  75 year old male with a history of chest pain EXAM: PORTABLE CHEST 1 VIEW COMPARISON:  11/28/2015 FINDINGS: Double density in the  right hilum, new from the comparison. No pleural effusion or pneumothorax. No confluent airspace disease. No displaced fracture. IMPRESSION: Double density in the right hilum, new from the comparison plain film in September. Given the rapid development, this may reflect a developing infection in the medial lung, however, contrast-enhanced CT is recommended to rule out adenopathy or mass. Signed, Dulcy Fanny. Earleen Newport, DO [facility] Electronically Signed   By: Corrie Mckusick D.O.   On: 12/14/2015 09:28    PROCEDURE: Complicated Foley Catheter Placement Note  Pre-operative Diagnosis: Urethral stricture, urinary retention  Post-operative Diagnosis: same  Surgeon: Sharmaine Base, MD  Assistants: None  Procedure Details  Patient was placed in the supine position, prepped with Betadine and draped in the usual sterile fashion.  We injected lidocaine jelly per urethra prior to the procedure. I then attempted to pass a 16XW silicone 2-way urethral catheter but met resistance in the bulbar urethra and could not pass the catheter. It was removed. A sensor wire was then gently advanced into the bladder, there was  some resistance at the presumed site of the stricture however it was manipulated proximally. A 70f open ended catheter was then guided over the sensor wire without much difficulty, about 30cm into the urethra. The sensor wire was removed and I noticed yellow urine emanating from the 541fopen ended catheter. 10cc of golden urine was aspirated using a sterile 10cc syringe and given to the RN staff for urinalysis & urine culture purposes. The sensor wire was then replaced into the bladder and the [id number] ended catheter was removed. A 16Fr 2-way councill tip catheter was attempted to pass over the sensor wire but I was unsuccessful due to resistance at the stricture. I therefore took a 18g spinal needle and made a defect in the end of a [id number] 2-way catheter and then threaded the sensor wire through the catheter. The 37Fr urethral catheter was then advanced over the sensor wire. There was moderate to significant resistance at the site of the stricture however with continued gentle pressure the catheter was eventually advanced into the bladder over the sensor wire. The catheter was inserted to the hub. The sensor wire was removed. 10cc sterile water was inserted into the catheter balloon. The catheter was gently brought back so that the catheter balloon was at the bladder neck. IT was attached to a standing gravity bag with 575cc golden urine drained in all.   The catheter was gently flushed with 60cc sterile NS with a [place] syringe to verify correct position.                Complications: None; patient tolerated the procedure well.

## 2015-12-14 NOTE — Progress Notes (Signed)
Attempted to get report from ED 

## 2015-12-14 NOTE — ED Provider Notes (Signed)
[facility] DEPT Provider Note   CSN: [id number] Arrival date & time: 12/14/15  [id number]     History   Chief Complaint Chief Complaint  Patient presents with  . Failure To Thrive  . Weakness    HPI Marcus [name] is a 75 y.o. male.  HPI Patient has chronic left-sided back and flank pain. He's been treated for possible shingles with gabapentin and course of steroids as well as antiviral medication. He's had increasing generalized weakness for the past 3 days. He's been unable to get up out of bed. He's had increasing shortness of breath. Pain with deep breathing or coughing. No vomiting or diarrhea. He also has decreased sensation in his right lower extremity compared to his left. Daughter's concern for possible right sided grip weakness. Thinks this is been over the last few days as well. Past Medical History:  Diagnosis Date  . Anxiety state, unspecified   . Calculus of kidney   . 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 Complication of anesthesia    " I woke up in the recovery room with tube in throat and panicked."  . 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 Herpes zoster without mention of complication   . History of hiatal hernia   . Hypertrophy of prostate with urinary obstruction and other lower urinary tract symptoms (LUTS)   . Insomnia, unspecified   . Lumbago   . Other and unspecified hyperlipidemia   . Pneumonia    bronchial  . Reflux esophagitis   . Seasonal allergies   . Stroke ([place])   . TIA (transient ischemic attack)   . Type II or unspecified type diabetes mellitus without mention of complication, not stated as uncontrolled   . Unspecified essential hypertension   . Unspecified vitamin D deficiency   . Urethral stricture unspecified   . Urinary frequency     Patient Active Problem List   Diagnosis Date Noted  . Atrial fibrillation with rapid ventricular response ([place]) 12/14/2015  . Fall 12/14/2015  . CAP (community acquired pneumonia)  12/14/2015  . Compression fracture of vertebra ([place]) 12/14/2015  . Urinary retention 12/14/2015  . 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 Dyspnea 11/12/2015  . Chest pain 11/12/2015  . S/P carotid endarterectomy 04/10/2015  . 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 Glaucoma 03/20/2015  . Diabetes ([place]) 10/10/2014  . HLD (hyperlipidemia) 10/10/2014  . CVA (cerebral vascular accident) ([place]) 10/10/2014  . 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 Essential hypertension   . Carotid stenosis   . TIA (transient ischemic attack) 06/01/2014  . Malaise and fatigue 11/08/2013  . Elevated alkaline phosphatase level 07/20/2012  . Lumbago   . Benign essential tremor   . Vitamin D deficiency     Past Surgical History:  Procedure Laterality Date  . CARDIAC CATHETERIZATION  1986   normal, Dr Lia Foyer  . ENDARTERECTOMY Left 06/08/2014   Procedure: LEFT CAROTID ARTERY ENDARTERECTOMY;  Surgeon: Rosetta Posner, MD;  Location: [facility];  Service: Vascular;  Laterality: Left;  . EYE SURGERY  2014   cataract  . EYE SURGERY  2013   Retina  . PATCH ANGIOPLASTY Left 06/08/2014   Procedure: WITH DACRON PATCH ANGIOPLASTY;  Surgeon: Rosetta Posner, MD;  Location: [facility];  Service: Vascular;  Laterality: Left;  . SPINAL FUSION  12/91,11/91   obtained bone from left lower leg  . stress  thallium  1994    normal;  . stretching bladder  07/2007   Dr Risa Grill       Home Medications    Prior to Admission medications   Medication Sig Start Date End Date Taking? Authorizing Provider  acetaminophen (TYLENOL) 500 MG tablet Take 1,000 mg by mouth every 6 (six) hours as needed for mild pain or moderate pain.   Yes Historical Provider, MD  atorvastatin (LIPITOR) 40 MG tablet TAKE ONE TABLET BY MOUTH ONCE DAILY FOR CHOLESTEROL 11/20/15  Yes Estill Dooms, MD  Cholecalciferol (VITAMIN D PO) Take 1 tablet by mouth daily at 12 noon.   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    Yes Historical Provider, MD  LORazepam (ATIVAN) 0.5 MG tablet Take 0.5 mg by mouth 3 (three) times daily as needed for anxiety.  11/21/15  Yes Historical Provider, MD  methocarbamol (ROBAXIN) 500 MG tablet Take 1 tablet (500 mg total) by mouth every 6 (six) hours as needed for muscle spasms. 12/05/15  Yes Estill Dooms, MD  nicotine polacrilex (COMMIT) 2 MG lozenge Take 2 mg by mouth 3 (three) times daily.    Yes Historical Provider, MD  ondansetron (ZOFRAN) 4 MG tablet Take 1 tablet (4 mg total) by mouth every 8 (eight) hours as needed for nausea or vomiting. 12/02/15  Yes Lauree Chandler, NP  timolol (TIMOPTIC) 0.25 % ophthalmic solution Place 1 drop into both eyes 2 (two) times daily.   Yes Historical Provider, MD  gabapentin (NEURONTIN) 100 MG capsule Take 1 capsule (100 mg total) by mouth 3 (three) times daily as needed. Patient not taking: Reported on 12/14/2015 12/02/15   Lauree Chandler, NP  predniSONE (DELTASONE) 10 MG tablet 40 mg daily for 4 days then decrease by 1 tablet daily until complete, 40/40/40/40/30/20/10 Patient not taking: Reported on 12/14/2015 12/02/15   Lauree Chandler, NP  traMADol (ULTRAM) 50 MG tablet Take 1 tablet (50 mg total) by mouth every 6 (six) hours as needed. Patient not taking: Reported on 12/14/2015 12/02/15   Gwenyth Allegra Tegeler, MD  valACYclovir (VALTREX) 1000 MG tablet Take 1 tablet (1,000 mg total) by mouth 3 (three) times daily. Patient not  taking: Reported on 12/14/2015 12/02/15   Lauree Chandler, NP    Family History Family History  Problem Relation Age of Onset  . Diabetes Mother   . Cancer Mother   . Hypertension Father   . Stroke Father   . Alcohol abuse Brother     Social History Social History  Substance Use Topics  . Smoking status: Former Smoker    Types: Cigarettes    Quit date: 01/01/1991  . Smokeless tobacco: Never Used     Comment: " Quit around 1986"  . Alcohol use No     Allergies   Codeine; Demerol [meperidine]; Contrast media [iodinated diagnostic agents]; and Other   Review of Systems Review of Systems  Constitutional: Positive for activity change, appetite change and chills. Negative for fever.  Respiratory: Positive for cough and shortness of breath. Negative for chest tightness.   Cardiovascular: Positive for chest pain and leg swelling. Negative for palpitations.  Gastrointestinal: Positive for abdominal distention. Negative for abdominal pain, constipation, diarrhea, nausea and vomiting.  Genitourinary: Positive for flank pain. Negative for dysuria and hematuria.  Musculoskeletal: Positive for back pain and myalgias. Negative for neck pain and neck stiffness.  Skin: Positive for rash. Negative for wound.  Neurological: Positive for tremors, weakness and numbness. Negative for dizziness and headaches.  Psychiatric/Behavioral: Positive for confusion.  All other systems reviewed and are negative.    Physical Exam Updated Vital Signs BP 110/85   Pulse (!) 155   Temp (S) 100.2 F (37.9 C) (Oral)   Resp 20   SpO2 94%   Physical Exam  Constitutional: He is oriented to person, place, and time. He appears well-developed and well-nourished.  Patient is dry appearing. Confused.  HENT:  Head: Normocephalic and atraumatic.  Dry mucous membranes.  Eyes: EOM are normal. Pupils are equal, round, and reactive to light.  Neck: Normal range of motion. Neck supple.  No meningismus.    Cardiovascular: Regular rhythm.   Tachycardia  Pulmonary/Chest: Effort normal.  Few scattered expiratory wheezes and rhonchi in bases. Splinting with tachypnea.  Abdominal: Soft. Bowel sounds are normal. He exhibits distension (abdomen is distended but nontender.). There is no tenderness. There is no rebound and no guarding.  Musculoskeletal: Normal range of motion. He exhibits no edema or tenderness.  2+ distal pulses in all extremities. The lower extremities appear mottled right greater than left. There may be some mild asymmetry between the lower extremities right greater than left. Patient tenderness palpation over the left lower ribs and left flank area. There is no midline deformity or step-offs.  Neurological: He is alert and oriented to person, place, and time.  Decreased sensation to the right leg compared to left. Decreased right hip flexion compared to left. Bilateral grip strengths appear equal. Cranial nerves II through XII grossly intact. Patient with mild confusion appears to answer questions appropriately.  Skin: Skin is warm and dry. Capillary refill takes less than 2 seconds. No rash noted. No erythema.  Patient has 3 erythematous macules to the left chest and abdomen. No clusters of vesicles or scabbing lesions. Looks inconsistent with herpes zoster.  Nursing note and vitals reviewed.    ED Treatments / Results  Labs (all labs ordered are listed, but only abnormal results are displayed) Labs Reviewed  BASIC METABOLIC PANEL - Abnormal; Notable for the following:       Result Value   Sodium 132 (*)    Chloride 93 (*)    Glucose, Bld 124 (*)    Calcium 8.6 (*)    All other components within normal limits  CBC - Abnormal; Notable for the following:    WBC 15.9 (*)    RBC 3.83 (*)    Hemoglobin 11.7 (*)    HCT 35.7 (*)    All other components within normal limits  HEPATIC FUNCTION PANEL - Abnormal; Notable for the following:    Albumin 1.9 (*)    AST 44 (*)     Alkaline Phosphatase 183 (*)    All other components within normal limits  BRAIN NATRIURETIC PEPTIDE - Abnormal; Notable for the following:    B Natriuretic Peptide 227.5 (*)    All other components within normal limits  CBG MONITORING, ED - Abnormal; Notable for the following:    Glucose-Capillary 138 (*)  All other components within normal limits  CULTURE, BLOOD (ROUTINE X 2)  CULTURE, BLOOD (ROUTINE X 2)  URINE CULTURE  URINALYSIS, ROUTINE W REFLEX MICROSCOPIC (NOT AT [facility])  I-STAT CG4 LACTIC ACID, ED  Randolm Idol, ED  I-STAT CG4 LACTIC ACID, ED    EKG  EKG Interpretation  Date/Time:  Saturday December 14 2015 13:53:32 EDT Ventricular Rate:  173 PR Interval:    QRS Duration: 81 QT Interval:  230 QTC Calculation: 391 R Axis:   59 Text Interpretation:  Atrial fibrillation with rapid V-rate Ventricular premature complex Abnormal R-wave progression, early transition Repolarization abnormality, prob rate related Baseline wander in lead(s) V3 Confirmed by Lita Mains  MD, Paddy Neis ([id number]) on 12/14/2015 2:21:35 PM       Radiology Ct Abdomen Pelvis Wo Contrast  Result Date: 12/14/2015 CLINICAL DATA:  Abdominal distention and pain. Shortness of breath. Decreased appetite. EXAM: CT CHEST, ABDOMEN AND PELVIS WITHOUT CONTRAST TECHNIQUE: Multidetector CT imaging of the chest, abdomen and pelvis was performed following the standard protocol without IV contrast. COMPARISON:  CT abdomen pelvis 12/01/2015 and CT chest 07/07/2013. FINDINGS: CT CHEST FINDINGS Cardiovascular: Atherosclerotic calcification of the arterial vasculature, including three-vessel involvement of the coronary arteries. Heart size normal. There is pericardial calcification, indicative of prior pericarditis. No pericardial effusion. Mediastinum/Nodes: No pathologically enlarged mediastinal or axillary lymph nodes. Hilar regions are difficult to definitively evaluate without IV contrast. Esophagus is grossly unremarkable.  Lungs/Pleura: Mild centrilobular emphysema. Rounded masslike consolidation in the medial aspect of the superior segment right lower lobe measures approximately 4.7 x 4.9 cm, is new from 07/07/2013, has internal ossific/calcific debris and obscures the extrapleural fat plane. Soft tissue extends around the adjacent vertebral body into the left extrapleural space. Trace right pleural effusion. Lungs are otherwise clear. Musculoskeletal: Lucency and destruction of the T7 vertebral body is seen with surrounding soft tissue. There is lysis within the anterior aspect of the T6 vertebral body as well. Airway is unremarkable. CT ABDOMEN PELVIS FINDINGS Hepatobiliary: Liver and gallbladder are unremarkable. No biliary ductal dilatation. Pancreas: Negative. Spleen: Negative. Adrenals/Urinary Tract: Adrenal glands are unremarkable. Inferior aspects of both kidneys are obscured by streak artifact from spinal hardware. Low-attenuation lesions in the left kidney measure up to 2.0 cm and are difficult to further characterize without post-contrast imaging. Ureters are decompressed. There are scattered bladder diverticulum. Stomach/Bowel: Stomach, small bowel, appendix and colon are unremarkable. Vascular/Lymphatic: Atherosclerotic calcification of the arterial vasculature without abdominal aortic aneurysm. Postoperative changes are seen at the aortic bifurcation. No pathologically enlarged lymph nodes. Reproductive: Prostate is slightly enlarged and calcified. Other: No free fluid.  Mesenteries and peritoneum are unremarkable. Musculoskeletal: Postoperative changes in the lumbar spine. No additional worrisome lytic or sclerotic lesions. Bone graft harvest sites in the iliac wings. IMPRESSION: 1. Masslike consolidation in the right lower lobe with invasion of the extrapleural fat, worrisome for primary bronchogenic carcinoma. Lymphoma and sarcoma are considered less likely. 2. Lytic destruction and compression of the T7 vertebral  body with surrounding soft tissue, consistent with a pathologic fracture. Minimal involvement of the T6 vertebral body. 3. Trace right pleural effusion. 4. No evidence of primary malignancy or metastatic disease in the abdomen or pelvis. 5.  Aortic atherosclerosis (ICD10-170.0). Electronically Signed   By: Lorin Picket M.D.   On: 12/14/2015 14:21   Ct Head Wo Contrast  Result Date: 12/14/2015 CLINICAL DATA:  Generalized weakness, headache and photosensitivity. Decreased appetite. Fall yesterday with loss of sensation in right leg, initial encounter. EXAM: CT HEAD WITHOUT CONTRAST  TECHNIQUE: Contiguous axial images were obtained from the base of the skull through the vertex without intravenous contrast. COMPARISON:  None. FINDINGS: Brain: No evidence of an acute infarct, acute hemorrhage, mass lesion, mass effect or hydrocephalus. Small area of encephalomalacia in the left occipital lobe, stable. Atrophy. Periventricular low attenuation. Vascular: No hyperdense vessel or unexpected calcification. Skull: Normal. Negative for fracture or focal lesion. Sinuses/Orbits: No acute finding. Other: None. IMPRESSION: 1. No acute intracranial abnormality. 2. Atrophy and chronic microvascular white matter ischemic changes. 3. Small area of encephalomalacia in the left occipital lobe. Electronically Signed   By: Lorin Picket M.D.   On: 12/14/2015 10:31   Ct Chest Wo Contrast  Result Date: 12/14/2015 CLINICAL DATA:  Abdominal distention and pain. Shortness of breath. Decreased appetite. EXAM: CT CHEST, ABDOMEN AND PELVIS WITHOUT CONTRAST TECHNIQUE: Multidetector CT imaging of the chest, abdomen and pelvis was performed following the standard protocol without IV contrast. COMPARISON:  CT abdomen pelvis 12/01/2015 and CT chest 07/07/2013. FINDINGS: CT CHEST FINDINGS Cardiovascular: Atherosclerotic calcification of the arterial vasculature, including three-vessel involvement of the coronary arteries. Heart size  normal. There is pericardial calcification, indicative of prior pericarditis. No pericardial effusion. Mediastinum/Nodes: No pathologically enlarged mediastinal or axillary lymph nodes. Hilar regions are difficult to definitively evaluate without IV contrast. Esophagus is grossly unremarkable. Lungs/Pleura: Mild centrilobular emphysema. Rounded masslike consolidation in the medial aspect of the superior segment right lower lobe measures approximately 4.7 x 4.9 cm, is new from 07/07/2013, has internal ossific/calcific debris and obscures the extrapleural fat plane. Soft tissue extends around the adjacent vertebral body into the left extrapleural space. Trace right pleural effusion. Lungs are otherwise clear. Musculoskeletal: Lucency and destruction of the T7 vertebral body is seen with surrounding soft tissue. There is lysis within the anterior aspect of the T6 vertebral body as well. Airway is unremarkable. CT ABDOMEN PELVIS FINDINGS Hepatobiliary: Liver and gallbladder are unremarkable. No biliary ductal dilatation. Pancreas: Negative. Spleen: Negative. Adrenals/Urinary Tract: Adrenal glands are unremarkable. Inferior aspects of both kidneys are obscured by streak artifact from spinal hardware. Low-attenuation lesions in the left kidney measure up to 2.0 cm and are difficult to further characterize without post-contrast imaging. Ureters are decompressed. There are scattered bladder diverticulum. Stomach/Bowel: Stomach, small bowel, appendix and colon are unremarkable. Vascular/Lymphatic: Atherosclerotic calcification of the arterial vasculature without abdominal aortic aneurysm. Postoperative changes are seen at the aortic bifurcation. No pathologically enlarged lymph nodes. Reproductive: Prostate is slightly enlarged and calcified. Other: No free fluid.  Mesenteries and peritoneum are unremarkable. Musculoskeletal: Postoperative changes in the lumbar spine. No additional worrisome lytic or sclerotic lesions. Bone  graft harvest sites in the iliac wings. IMPRESSION: 1. Masslike consolidation in the right lower lobe with invasion of the extrapleural fat, worrisome for primary bronchogenic carcinoma. Lymphoma and sarcoma are considered less likely. 2. Lytic destruction and compression of the T7 vertebral body with surrounding soft tissue, consistent with a pathologic fracture. Minimal involvement of the T6 vertebral body. 3. Trace right pleural effusion. 4. No evidence of primary malignancy or metastatic disease in the abdomen or pelvis. 5.  Aortic atherosclerosis (ICD10-170.0). Electronically Signed   By: Lorin Picket M.D.   On: 12/14/2015 14:21   Dg Chest Port 1 View  Result Date: 12/14/2015 CLINICAL DATA:  75 year old male with a history of chest pain EXAM: PORTABLE CHEST 1 VIEW COMPARISON:  11/28/2015 FINDINGS: Double density in the right hilum, new from the comparison. No pleural effusion or pneumothorax. No confluent airspace disease. No displaced fracture. IMPRESSION:  Double density in the right hilum, new from the comparison plain film in September. Given the rapid development, this may reflect a developing infection in the medial lung, however, contrast-enhanced CT is recommended to rule out adenopathy or mass. Signed, Dulcy Fanny. Earleen Newport, DO [facility] Radiology Electronically Signed   By: Corrie Mckusick D.O.   On: 12/14/2015 09:28    Procedures Procedures (including critical care time)  Medications Ordered in ED Medications  ondansetron (ZOFRAN) injection 4 mg (4 mg Intravenous Given 12/14/15 0654)  fentaNYL (SUBLIMAZE) injection 50 mcg (50 mcg Intravenous Given 12/14/15 0651)  fentaNYL (SUBLIMAZE) injection 50 mcg (50 mcg Intravenous Given 12/14/15 0814)  0.9 %  sodium chloride infusion ( Intravenous Stopped 12/14/15 1000)  sodium chloride 0.9 % bolus 500 mL (0 mLs Intravenous Stopped 12/14/15 1000)  cefTRIAXone (ROCEPHIN) 1 g in dextrose 5 % 50 mL  IVPB (0 g Intravenous Stopped 12/14/15 1222)  azithromycin (ZITHROMAX) 500 mg in dextrose 5 % 250 mL IVPB (0 mg Intravenous Stopped 12/14/15 1357)  sodium chloride 0.9 % bolus 500 mL (0 mLs Intravenous Stopped 12/14/15 1222)  Barium Sulfate 2.1 % SUSP ( Mouth/Throat Given 12/14/15 1155)  sodium chloride 0.9 % bolus 1,000 mL (0 mLs Intravenous Stopped 12/14/15 1507)  diltiazem (CARDIZEM) 100 mg in dextrose 5 % 100 mL (1 mg/mL) infusion (10 mg/hr Intravenous Rate/Dose Change 12/14/15 1503)     Initial Impression / Assessment and Plan / ED Course  I have reviewed the triage vital signs and the nursing notes.  Pertinent labs & imaging results that were available during my care of the patient were reviewed by me and considered in my medical decision making (see chart for details). CRITICAL CARE Performed by: Lita Mains, Rocklyn Mayberry Total critical care time: 40 minutes Critical care time was exclusive of separately billable procedures and treating other patients. Critical care was necessary to treat or prevent imminent or life-threatening deterioration. Critical care was time spent personally by me on the following activities: development of treatment plan with patient and/or surrogate as well as nursing, discussions with consultants, evaluation of patient's response to treatment, examination of patient, obtaining history from patient or surrogate, ordering and performing treatments and interventions, ordering and review of laboratory studies, ordering and review of radiographic studies, pulse oximetry and re-evaluation of patient's condition. Clinical Course   Patient with increasing weakness and now shortness of breath. Concern for developing pneumonia but PE is on the patient's differential.  Patient with urinary retention in the emergency department. Unable to pass small catheter. History of urinary strictures. Patient also developed tachycardia with heart rate in the 180s. Appears to be atrial  fibrillation with RVR. Started on Cardizem drip. CT concerning for pulmonary mass with pathologic fracture of T7. Will discuss with urology regarding possible suprapubic catheter placement or urethral dilation. Discussed with hospitalist admission.  On further questioning patient states that 2-3 days ago he fell in the shower landing on his buttocks. He has been having difficult ambulating since that point. Discussed with urology and will see patient in the emergency department. Asked to have urology cart at bedside.  Discussed with hospitalist. Will see in the emergency department and admit to step down bed. Final Clinical Impressions(s) / ED Diagnoses   Final diagnoses:  Community acquired pneumonia of left lung, unspecified part of lung  Right lower lobe lung mass  Closed fracture of seventh thoracic vertebra, unspecified fracture morphology, initial encounter [facility])  Urinary retention  Atrial fibrillation with RVR ([place])  New Prescriptions New Prescriptions   No medications on file     Julianne Rice, MD 12/14/15 1534

## 2015-12-14 NOTE — ED Triage Notes (Signed)
Pt arrives with generalized weakness, H/A with photosensitivity for unspecified time, decreased appetite x2 weeks, abdominal pain/shingles painx4 weeks. Pt reports fall yesterday at 10AM and loss of sensation to R leg yesterday. Hx TIA and peripheral neuropathy.

## 2015-12-14 NOTE — ED Notes (Signed)
Attempted to place an 55fr foley catheter without success.

## 2015-12-14 NOTE — ED Notes (Signed)
Pt unable to feel both legs when assessed and unable to lift either leg.

## 2015-12-14 NOTE — ED Notes (Signed)
Attempted report x2.  Name and callback number provided.  RN phone given to Network engineer.

## 2015-12-15 ENCOUNTER — Inpatient Hospital Stay (HOSPITAL_COMMUNITY): Payer: Medicare Other

## 2015-12-15 ENCOUNTER — Encounter (HOSPITAL_COMMUNITY): Payer: Self-pay | Admitting: Radiology

## 2015-12-15 DIAGNOSIS — Z833 Family history of diabetes mellitus: Secondary | ICD-10-CM

## 2015-12-15 DIAGNOSIS — I6522 Occlusion and stenosis of left carotid artery: Secondary | ICD-10-CM

## 2015-12-15 DIAGNOSIS — I10 Essential (primary) hypertension: Secondary | ICD-10-CM

## 2015-12-15 DIAGNOSIS — Z8249 Family history of ischemic heart disease and other diseases of the circulatory system: Secondary | ICD-10-CM

## 2015-12-15 DIAGNOSIS — R29898 Other symptoms and signs involving the musculoskeletal system: Secondary | ICD-10-CM

## 2015-12-15 DIAGNOSIS — Z87891 Personal history of nicotine dependence: Secondary | ICD-10-CM

## 2015-12-15 DIAGNOSIS — R918 Other nonspecific abnormal finding of lung field: Secondary | ICD-10-CM

## 2015-12-15 DIAGNOSIS — D72829 Elevated white blood cell count, unspecified: Secondary | ICD-10-CM

## 2015-12-15 DIAGNOSIS — Z888 Allergy status to other drugs, medicaments and biological substances status: Secondary | ICD-10-CM

## 2015-12-15 DIAGNOSIS — Z811 Family history of alcohol abuse and dependence: Secondary | ICD-10-CM

## 2015-12-15 DIAGNOSIS — M4850XS Collapsed vertebra, not elsewhere classified, site unspecified, sequela of fracture: Secondary | ICD-10-CM

## 2015-12-15 DIAGNOSIS — J189 Pneumonia, unspecified organism: Secondary | ICD-10-CM

## 2015-12-15 DIAGNOSIS — I4891 Unspecified atrial fibrillation: Secondary | ICD-10-CM

## 2015-12-15 DIAGNOSIS — I639 Cerebral infarction, unspecified: Secondary | ICD-10-CM

## 2015-12-15 DIAGNOSIS — E7800 Pure hypercholesterolemia, unspecified: Secondary | ICD-10-CM

## 2015-12-15 DIAGNOSIS — R7881 Bacteremia: Secondary | ICD-10-CM | POA: Diagnosis present

## 2015-12-15 DIAGNOSIS — Z91041 Radiographic dye allergy status: Secondary | ICD-10-CM

## 2015-12-15 DIAGNOSIS — M899 Disorder of bone, unspecified: Secondary | ICD-10-CM

## 2015-12-15 DIAGNOSIS — Z809 Family history of malignant neoplasm, unspecified: Secondary | ICD-10-CM

## 2015-12-15 DIAGNOSIS — Z885 Allergy status to narcotic agent status: Secondary | ICD-10-CM

## 2015-12-15 LAB — BLOOD CULTURE ID PANEL (REFLEXED)
Acinetobacter baumannii: NOT DETECTED
CANDIDA TROPICALIS: NOT DETECTED
Candida albicans: NOT DETECTED
Candida glabrata: NOT DETECTED
Candida krusei: NOT DETECTED
Candida parapsilosis: NOT DETECTED
Carbapenem resistance: NOT DETECTED
ENTEROCOCCUS SPECIES: NOT DETECTED
Enterobacter cloacae complex: NOT DETECTED
Enterobacteriaceae species: NOT DETECTED
Escherichia coli: NOT DETECTED
HAEMOPHILUS INFLUENZAE: NOT DETECTED
Klebsiella oxytoca: NOT DETECTED
Klebsiella pneumoniae: NOT DETECTED
LISTERIA MONOCYTOGENES: NOT DETECTED
METHICILLIN RESISTANCE: NOT DETECTED
Neisseria meningitidis: NOT DETECTED
PROTEUS SPECIES: NOT DETECTED
Pseudomonas aeruginosa: NOT DETECTED
SERRATIA MARCESCENS: NOT DETECTED
STAPHYLOCOCCUS AUREUS BCID: DETECTED — AB
STREPTOCOCCUS PYOGENES: NOT DETECTED
Staphylococcus species: DETECTED — AB
Streptococcus agalactiae: NOT DETECTED
Streptococcus pneumoniae: NOT DETECTED
Streptococcus species: NOT DETECTED
VANCOMYCIN RESISTANCE: NOT DETECTED

## 2015-12-15 LAB — BASIC METABOLIC PANEL
ANION GAP: 8 (ref 5–15)
BUN: 16 mg/dL (ref 6–20)
CALCIUM: 8.6 mg/dL — AB (ref 8.9–10.3)
CO2: 28 mmol/L (ref 22–32)
Chloride: 96 mmol/L — ABNORMAL LOW (ref 101–111)
Creatinine, Ser: 0.86 mg/dL (ref 0.61–1.24)
GFR calc Af Amer: >60 mL/min (ref >60)
GFR calc non Af Amer: >60 mL/min (ref >60)
GLUCOSE: 110 mg/dL — AB (ref 65–99)
Potassium: 3.6 mmol/L (ref 3.5–5.1)
Sodium: 132 mmol/L — ABNORMAL LOW (ref 135–145)

## 2015-12-15 LAB — LIPID PANEL
CHOLESTEROL: 52 mg/dL (ref 0–200)
HDL: 10 mg/dL — ABNORMAL LOW (ref >40)
LDL CALC: 30 mg/dL (ref 0–99)
Total CHOL/HDL Ratio: 5.2 RATIO
Triglycerides: 61 mg/dL (ref <150)
VLDL: 12 mg/dL (ref 0–40)

## 2015-12-15 LAB — CBC
HEMATOCRIT: 32.7 % — AB (ref 39.0–52.0)
HEMOGLOBIN: 10.5 g/dL — AB (ref 13.0–17.0)
MCH: 30.1 pg (ref 26.0–34.0)
MCHC: 32.1 g/dL (ref 30.0–36.0)
MCV: 93.7 fL (ref 78.0–100.0)
Platelets: 275 10*3/uL (ref 150–400)
RBC: 3.49 MIL/uL — ABNORMAL LOW (ref 4.22–5.81)
RDW: 14.4 % (ref 11.5–15.5)
WBC: 13.1 10*3/uL — ABNORMAL HIGH (ref 4.0–10.5)

## 2015-12-15 LAB — STREP PNEUMONIAE URINARY ANTIGEN: Strep Pneumo Urinary Antigen: NEGATIVE

## 2015-12-15 LAB — MRSA PCR SCREENING: MRSA by PCR: NEGATIVE

## 2015-12-15 MED ORDER — TIMOLOL HEMIHYDRATE 0.5 % OP SOLN
1.0000 [drp] | Freq: Two times a day (BID) | OPHTHALMIC | Status: DC
  Filled 2015-12-15: qty 5

## 2015-12-15 MED ORDER — CEFAZOLIN SODIUM-DEXTROSE 2-4 GM/100ML-% IV SOLN
2.0000 g | Freq: Three times a day (TID) | INTRAVENOUS | Status: DC
Start: 2015-12-15 — End: 2015-12-22
  Administered 2015-12-15 – 2015-12-22 (×21): 2 g via INTRAVENOUS
  Filled 2015-12-15 (×25): qty 100

## 2015-12-15 MED ORDER — ACETAMINOPHEN 500 MG PO TABS: 1000.0000 mg | ORAL_TABLET | Freq: Four times a day (QID) | ORAL | Status: DC | PRN

## 2015-12-15 MED ORDER — DILTIAZEM HCL-DEXTROSE 100-5 MG/100ML-% IV SOLN (PREMIX)
5.0000 mg/h | INTRAVENOUS | Status: DC
  Administered 2015-12-15 – 2015-12-16 (×3): 5 mg/h via INTRAVENOUS
  Filled 2015-12-15 (×3): qty 100

## 2015-12-15 MED ORDER — TIMOLOL MALEATE 0.5 % OP SOLN
1.0000 [drp] | Freq: Two times a day (BID) | OPHTHALMIC | Status: DC
  Administered 2015-12-15 – 2015-12-24 (×18): 1 [drp] via OPHTHALMIC
  Filled 2015-12-15: qty 5

## 2015-12-15 MED ORDER — LORAZEPAM 0.5 MG PO TABS
0.5000 mg | ORAL_TABLET | Freq: Two times a day (BID) | ORAL | Status: DC | PRN
  Administered 2015-12-15 – 2015-12-23 (×6): 0.5 mg via ORAL
  Filled 2015-12-15 (×6): qty 1

## 2015-12-15 MED ORDER — LORAZEPAM 0.5 MG PO TABS: 0.5000 mg | ORAL_TABLET | ORAL | Status: DC

## 2015-12-15 MED ORDER — METOPROLOL TARTRATE 12.5 MG HALF TABLET
12.5000 mg | ORAL_TABLET | Freq: Two times a day (BID) | ORAL | Status: DC
  Administered 2015-12-15: 12.5 mg via ORAL
  Filled 2015-12-15: qty 1

## 2015-12-15 MED ORDER — HYDROCODONE-ACETAMINOPHEN 5-325 MG PO TABS
0.5000 | ORAL_TABLET | Freq: Four times a day (QID) | ORAL | Status: DC | PRN
  Administered 2015-12-15 – 2015-12-16 (×2): 1 via ORAL
  Filled 2015-12-15 (×3): qty 1

## 2015-12-15 MED ORDER — LORAZEPAM 0.5 MG PO TABS
0.5000 mg | ORAL_TABLET | Freq: Every day | ORAL | Status: DC
  Administered 2015-12-16 – 2015-12-24 (×9): 0.5 mg via ORAL
  Filled 2015-12-15 (×10): qty 1

## 2015-12-15 NOTE — Progress Notes (Signed)
Patient Name: Marcus [name] Date of Encounter: 12/15/2015  Primary Cardiologist: [facility] Problem List     Principal Problem:   Atrial fibrillation with rapid ventricular response [facility]) Active Problems:   Essential hypertension   Carotid stenosis   Diabetes ([place])   HLD (hyperlipidemia)   CVA (cerebral vascular accident) ([place])   Fall   CAP (community acquired pneumonia)   Compression fracture of vertebra ([place])   Urinary retention   New onset a-fib ([place])   Right lower lobe lung mass   Lower extremity weakness     Subjective   Denies chest pain/palps.  Inpatient Medications    Scheduled Meds: . azithromycin  500 mg Oral Q24H  . cefTRIAXone (ROCEPHIN)  IV  1 g Intravenous Q24H   Continuous Infusions: . diltiazem (CARDIZEM) infusion 15 mg/hr (12/15/15 0550)   PRN Meds: acetaminophen, ondansetron (ZOFRAN) IV   Vital Signs    Vitals:   12/14/15 2303 12/15/15 0200 12/15/15 0554 12/15/15 0746  BP: 121/74 (!) 111/99 115/66   Pulse: 84  64 66  Resp: 19 (!) 29 (!) 25 (!) 21  Temp: 98.7 F (37.1 C)  98.5 F (36.9 C) 98.6 F (37 C)  TempSrc: Oral  Oral Oral  SpO2: 95%  96% 97%  Weight: 207 lb 6.4 oz (94.1 kg)  208 lb 9.6 oz (94.6 kg)   Height: 5' 7.5" (1.715 m)       Intake/Output Summary (Last 24 hours) at 12/15/15 0922 Last data filed at 12/15/15 0559  Gross per 24 hour  Intake           3967.5 ml  Output             1225 ml  Net           2742.5 ml   Filed Weights   12/14/15 2303 12/15/15 0554  Weight: 207 lb 6.4 oz (94.1 kg) 208 lb 9.6 oz (94.6 kg)    Physical Exam    GEN: Well nourished, well developed, in no acute distress.  HEENT: Grossly normal.  Neck: Supple, no JVD. Cardiac: RRR, no murmurs, rubs, or gallops. No clubbing, cyanosis, edema.   Respiratory:  Respirations regular and unlabored, clear to auscultation bilaterally. GI: Soft, nontender, nondistended. MS: no deformity or atrophy. Skin: warm and dry, no rash. Neuro:  Strength  and sensation are intact. Psych: AAOx3.  Normal affect.  Labs    CBC  Recent Labs  12/14/15 0635 12/15/15 0351  WBC 15.9* 13.1*  HGB 11.7* 10.5*  HCT 35.7* 32.7*  MCV 93.2 93.7  PLT 291 [id number]   Basic Metabolic Panel  Recent Labs  12/14/15 0635 12/15/15 0351  NA 132* 132*  K 3.6 3.6  CL 93* 96*  CO2 29 28  GLUCOSE 124* 110*  BUN 12 16  CREATININE 0.79 0.86  CALCIUM 8.6* 8.6*   Liver Function Tests  Recent Labs  12/14/15 0635  AST 44*  ALT 28  ALKPHOS 183*  BILITOT 0.7  PROT 7.9  ALBUMIN 1.9*   No results for input(s): LIPASE, AMYLASE in the last 72 hours. Cardiac Enzymes No results for input(s): CKTOTAL, CKMB, CKMBINDEX, TROPONINI in the last 72 hours. BNP Invalid input(s): POCBNP D-Dimer No results for input(s): DDIMER in the last 72 hours. Hemoglobin A1C No results for input(s): HGBA1C in the last 72 hours. Fasting Lipid Panel  Recent Labs  12/15/15 0351  CHOL 52  HDL 10*  LDLCALC 30  TRIG 61  CHOLHDL 5.2  Thyroid Function Tests  Recent Labs  12/14/15 1707  TSH 0.469    Telemetry    - Personally Reviewed  ECG     - Personally Reviewed  Radiology    Ct Abdomen Pelvis Wo Contrast  Result Date: 12/14/2015 CLINICAL DATA:  Abdominal distention and pain. Shortness of breath. Decreased appetite. EXAM: CT CHEST, ABDOMEN AND PELVIS WITHOUT CONTRAST TECHNIQUE: Multidetector CT imaging of the chest, abdomen and pelvis was performed following the standard protocol without IV contrast. COMPARISON:  CT abdomen pelvis 12/01/2015 and CT chest 07/07/2013. FINDINGS: CT CHEST FINDINGS Cardiovascular: Atherosclerotic calcification of the arterial vasculature, including three-vessel involvement of the coronary arteries. Heart size normal. There is pericardial calcification, indicative of prior pericarditis. No pericardial effusion. Mediastinum/Nodes: No pathologically enlarged mediastinal or axillary lymph nodes. Hilar regions are difficult to  definitively evaluate without IV contrast. Esophagus is grossly unremarkable. Lungs/Pleura: Mild centrilobular emphysema. Rounded masslike consolidation in the medial aspect of the superior segment right lower lobe measures approximately 4.7 x 4.9 cm, is new from 07/07/2013, has internal ossific/calcific debris and obscures the extrapleural fat plane. Soft tissue extends around the adjacent vertebral body into the left extrapleural space. Trace right pleural effusion. Lungs are otherwise clear. Musculoskeletal: Lucency and destruction of the T7 vertebral body is seen with surrounding soft tissue. There is lysis within the anterior aspect of the T6 vertebral body as well. Airway is unremarkable. CT ABDOMEN PELVIS FINDINGS Hepatobiliary: Liver and gallbladder are unremarkable. No biliary ductal dilatation. Pancreas: Negative. Spleen: Negative. Adrenals/Urinary Tract: Adrenal glands are unremarkable. Inferior aspects of both kidneys are obscured by streak artifact from spinal hardware. Low-attenuation lesions in the left kidney measure up to 2.0 cm and are difficult to further characterize without post-contrast imaging. Ureters are decompressed. There are scattered bladder diverticulum. Stomach/Bowel: Stomach, small bowel, appendix and colon are unremarkable. Vascular/Lymphatic: Atherosclerotic calcification of the arterial vasculature without abdominal aortic aneurysm. Postoperative changes are seen at the aortic bifurcation. No pathologically enlarged lymph nodes. Reproductive: Prostate is slightly enlarged and calcified. Other: No free fluid.  Mesenteries and peritoneum are unremarkable. Musculoskeletal: Postoperative changes in the lumbar spine. No additional worrisome lytic or sclerotic lesions. Bone graft harvest sites in the iliac wings. IMPRESSION: 1. Masslike consolidation in the right lower lobe with invasion of the extrapleural fat, worrisome for primary bronchogenic carcinoma. Lymphoma and sarcoma are  considered less likely. 2. Lytic destruction and compression of the T7 vertebral body with surrounding soft tissue, consistent with a pathologic fracture. Minimal involvement of the T6 vertebral body. 3. Trace right pleural effusion. 4. No evidence of primary malignancy or metastatic disease in the abdomen or pelvis. 5.  Aortic atherosclerosis (ICD10-170.0). Electronically Signed   By: Lorin Picket M.D.   On: 12/14/2015 14:21   Ct Head Wo Contrast  Result Date: 12/14/2015 CLINICAL DATA:  Generalized weakness, headache and photosensitivity. Decreased appetite. Fall yesterday with loss of sensation in right leg, initial encounter. EXAM: CT HEAD WITHOUT CONTRAST TECHNIQUE: Contiguous axial images were obtained from the base of the skull through the vertex without intravenous contrast. COMPARISON:  None. FINDINGS: Brain: No evidence of an acute infarct, acute hemorrhage, mass lesion, mass effect or hydrocephalus. Small area of encephalomalacia in the left occipital lobe, stable. Atrophy. Periventricular low attenuation. Vascular: No hyperdense vessel or unexpected calcification. Skull: Normal. Negative for fracture or focal lesion. Sinuses/Orbits: No acute finding. Other: None. IMPRESSION: 1. No acute intracranial abnormality. 2. Atrophy and chronic microvascular white matter ischemic changes. 3. Small area of encephalomalacia in the  left occipital lobe. Electronically Signed   By: Lorin Picket M.D.   On: 12/14/2015 10:31   Ct Chest Wo Contrast  Result Date: 12/14/2015 CLINICAL DATA:  Abdominal distention and pain. Shortness of breath. Decreased appetite. EXAM: CT CHEST, ABDOMEN AND PELVIS WITHOUT CONTRAST TECHNIQUE: Multidetector CT imaging of the chest, abdomen and pelvis was performed following the standard protocol without IV contrast. COMPARISON:  CT abdomen pelvis 12/01/2015 and CT chest 07/07/2013. FINDINGS: CT CHEST FINDINGS Cardiovascular: Atherosclerotic calcification of the arterial  vasculature, including three-vessel involvement of the coronary arteries. Heart size normal. There is pericardial calcification, indicative of prior pericarditis. No pericardial effusion. Mediastinum/Nodes: No pathologically enlarged mediastinal or axillary lymph nodes. Hilar regions are difficult to definitively evaluate without IV contrast. Esophagus is grossly unremarkable. Lungs/Pleura: Mild centrilobular emphysema. Rounded masslike consolidation in the medial aspect of the superior segment right lower lobe measures approximately 4.7 x 4.9 cm, is new from 07/07/2013, has internal ossific/calcific debris and obscures the extrapleural fat plane. Soft tissue extends around the adjacent vertebral body into the left extrapleural space. Trace right pleural effusion. Lungs are otherwise clear. Musculoskeletal: Lucency and destruction of the T7 vertebral body is seen with surrounding soft tissue. There is lysis within the anterior aspect of the T6 vertebral body as well. Airway is unremarkable. CT ABDOMEN PELVIS FINDINGS Hepatobiliary: Liver and gallbladder are unremarkable. No biliary ductal dilatation. Pancreas: Negative. Spleen: Negative. Adrenals/Urinary Tract: Adrenal glands are unremarkable. Inferior aspects of both kidneys are obscured by streak artifact from spinal hardware. Low-attenuation lesions in the left kidney measure up to 2.0 cm and are difficult to further characterize without post-contrast imaging. Ureters are decompressed. There are scattered bladder diverticulum. Stomach/Bowel: Stomach, small bowel, appendix and colon are unremarkable. Vascular/Lymphatic: Atherosclerotic calcification of the arterial vasculature without abdominal aortic aneurysm. Postoperative changes are seen at the aortic bifurcation. No pathologically enlarged lymph nodes. Reproductive: Prostate is slightly enlarged and calcified. Other: No free fluid.  Mesenteries and peritoneum are unremarkable. Musculoskeletal: Postoperative  changes in the lumbar spine. No additional worrisome lytic or sclerotic lesions. Bone graft harvest sites in the iliac wings. IMPRESSION: 1. Masslike consolidation in the right lower lobe with invasion of the extrapleural fat, worrisome for primary bronchogenic carcinoma. Lymphoma and sarcoma are considered less likely. 2. Lytic destruction and compression of the T7 vertebral body with surrounding soft tissue, consistent with a pathologic fracture. Minimal involvement of the T6 vertebral body. 3. Trace right pleural effusion. 4. No evidence of primary malignancy or metastatic disease in the abdomen or pelvis. 5.  Aortic atherosclerosis (ICD10-170.0). Electronically Signed   By: Lorin Picket M.D.   On: 12/14/2015 14:21   Dg Chest Port 1 View  Result Date: 12/14/2015 CLINICAL DATA:  75 year old male with a history of chest pain EXAM: PORTABLE CHEST 1 VIEW COMPARISON:  11/28/2015 FINDINGS: Double density in the right hilum, new from the comparison. No pleural effusion or pneumothorax. No confluent airspace disease. No displaced fracture. IMPRESSION: Double density in the right hilum, new from the comparison plain film in September. Given the rapid development, this may reflect a developing infection in the medial lung, however, contrast-enhanced CT is recommended to rule out adenopathy or mass. Signed, Dulcy Fanny. Earleen Newport, DO [facility] Radiology Electronically Signed   By: Corrie Mckusick D.O.   On: 12/14/2015 09:28    Cardiac Studies   Echo pending (EF 60-65% on 06/02/14)  Patient Profile     Marcus [name] is a 75 y.o. male with  a PMH significant for HTN, DM, TIA on clopidogrel, carotid disease s/p CEA, urethral stricture, and GERD who presented to the ED with several days of worsening malaise/fatigue, and a recent fall who then developed Afib with RVR in the ED.  He subsequently converted to NSR following uptitration of a diltiazem gtt and additional bolus  dosing of Metoprolol.  He has maintained NSR since.  His Afib is likely driven by his underlying infectious process, rather than a primary arrhythmogenic event.    Assessment & Plan    1) Atrial fibrillation with rapid ventricular response; hemodynamically stable and asymptomatic during period of RVR, subsequently converted to NSR. - will discontinue diltiazem gtt as he has maintained normal sinus rhythm - start low-dose oral beta-blockade with Metoprolol 12.5mg  PO BID as tolerated by BP.  Lower dose given concern for sepsis, can uptitrate as tolerated - continuous telemetry - maintain Mg>2 and K>4 - repeat ECG if Afib recurs - TTE today  - defer systemic anticoagulation given concern for active lung malignancy  2) HTN; mild hypotension with diltiazem gtt was increased yesterday evening, now resolved. - metoprolol as noted above  3) TIA; clopidogrel as per neurology recommendations, no indication for this medication from a cardiac perspective  4) CAP; management as per primary service   Signed, Kate Sable, MD  12/15/2015, 9:22 AM

## 2015-12-15 NOTE — Consult Note (Signed)
[facility]  Total days of antibiotics 2        Day 2 azithromycin        Day 1 cefazolin               Reason for Consult: auto consult for [name]    Referring Physician: sheikh  Principal Problem:   Atrial fibrillation with rapid ventricular response ([place]) Active Problems:   Essential hypertension   Carotid stenosis   Diabetes ([place])   HLD (hyperlipidemia)   CVA (cerebral vascular accident) ([place])   Fall   CAP (community acquired pneumonia)   Compression fracture of vertebra ([place])   Urinary retention   New onset a-fib ([place])   Right lower lobe lung mass   Lower extremity weakness    HPI: Marcus [name] is a 75 y.o. male with multiple medical problems including HTN, DM, TIA/CVA with right side weakness, BPH, hx of urethral stricture/recurrent UTI, pathologic fracture of T7, shingles roughly 4 wks ago. He was admitted on 10/14 for 3 day history of worsening malaise, weakness, and shortness of breath. He denies chest pain palpitations headache dizziness syncope or near-syncope. He denies any abdominal pain nausea vomiting but does endorse decreased oral intake. On admit, he was found to have temp of 100.2, leukocytosis of 15.9k. He developed afib with RVR, with no previous hx of afib.he initially required diltiazem drip converted to scheduled metoprolol. Concern for infectious process that would be causing his afib, he underwent multiple studies. His work up showed CXR with new right hilum density/fullness which recommended CT which showed RLL 4.7 x 5 cm mass like consolidation that is new from 2015, but cxr suggests new from march 2017 per my read. Parenchyma was otherwise normal. His thoracic spine showed lucency and destruction at T7. His blood cx from admit on 10/14 grew MSSA. He was initially placed on cap treatment with azithromycin and ceftriaxone. His fever curve is trending down.he reports not having fevers but still short of breath. He is  scheduled for lung biopsy tomorrow     Past Medical History:  Diagnosis Date  . Anxiety state, unspecified   . Calculus of kidney   . 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 Complication of anesthesia    " I woke up in the recovery room with tube in throat and panicked."  . 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 Herpes zoster without mention of complication   . History of hiatal hernia   . Hypertrophy of prostate with urinary obstruction and other lower urinary tract symptoms (LUTS)   . Insomnia, unspecified   . Lumbago   . Other and unspecified hyperlipidemia   . Pneumonia    bronchial  . Reflux esophagitis   . Seasonal allergies   . Stroke ([place])   . TIA (transient ischemic attack)   . Type II or unspecified type diabetes mellitus without mention of complication, not stated as uncontrolled   . Unspecified essential hypertension   . Unspecified vitamin D deficiency   . Urethral stricture unspecified   . Urinary frequency     Allergies:  Allergies  Allergen Reactions  . Codeine Other (See Comments)    constipation  . Demerol [Meperidine] Other (See Comments)    Drops blood pressure   . Contrast Media [Iodinated Diagnostic Agents] Nausea And Vomiting  . Gabapentin Other (See Comments)    Suicidal, depression  . Other Other (See Comments)    All  narcotics make pt hallucinate    MEDICATIONS: . azithromycin  500 mg Oral Q24H  .  ceFAZolin (ANCEF) IV  2 g Intravenous Q8H  . metoprolol tartrate  12.5 mg Oral BID    Social History  Substance Use Topics  . Smoking status: Former Smoker    Types: Cigarettes    Quit date: 01/01/1991  . Smokeless tobacco: Never Used     Comment: " Quit around 1986"  . Alcohol use No    Family History  Problem Relation Age of Onset  . Diabetes Mother   . Cancer Mother   . Hypertension Father   . Stroke Father   . Alcohol abuse Brother     Review of Systems  Constitutional: Negative for fever, chills, diaphoresis,  activity change, appetite change, fatigue and unexpected weight change.  HENT: Negative for congestion, sore throat, rhinorrhea, sneezing, trouble swallowing and sinus pressure.  Eyes: Negative for photophobia and visual disturbance.  Respiratory:+shortness of breath. Negative for cough, chest tightness, shortness of breath, wheezing and stridor.  Cardiovascular: Negative for chest pain, palpitations and leg swelling.  Gastrointestinal: Negative for nausea, vomiting, abdominal pain, diarrhea, constipation, blood in stool, abdominal distention and anal bleeding.  Genitourinary: Negative for dysuria, hematuria, flank pain and difficulty urinating.  Musculoskeletal: + backpain Negative for myalgias, back pain, joint swelling, arthralgias and gait problem.  Skin: Negative for color change, pallor, rash and wound.  Neurological: Negative for dizziness, tremors, weakness and light-headedness.  Hematological: Negative for adenopathy. Does not bruise/bleed easily.  Psychiatric/Behavioral: Negative for behavioral problems, confusion, sleep disturbance, dysphoric mood, decreased concentration and agitation.     OBJECTIVE: Temp:  [98.5 F (36.9 C)-100.2 F (37.9 C)] 98.6 F (37 C) (10/15 0746) Pulse Rate:  [64-186] 66 (10/15 0746) Resp:  [17-29] 21 (10/15 0746) BP: (88-139)/(59-99) 115/66 (10/15 0554) SpO2:  [93 %-97 %] 97 % (10/15 0746) Weight:  [207 lb 6.4 oz (94.1 kg)-208 lb 9.6 oz (94.6 kg)] 208 lb 9.6 oz (94.6 kg) (10/15 0554) Physical Exam  Constitutional: He is oriented to person, place, and time. He appears well-developed and well-nourished. No distress.  HENT:  Mouth/Throat: Oropharynx is clear and moist. No oropharyngeal exudate.  Cardiovascular: irreg, irreg, regular rhythm and normal heart sounds. Exam reveals no gallop and no friction rub.  No murmur heard.  Pulmonary/Chest: Effort normal and breath sounds normal. No respiratory distress. He has no wheezes. Decrease breath sounds at  bases Abdominal: Soft. Bowel sounds are normal. He exhibits no distension. There is no tenderness.  Lymphadenopathy:  He has no cervical adenopathy.  Neurological: He is alert and oriented to person, place, and time.  Skin: Skin is warm and dry. No rash noted. No erythema.  Psychiatric: He has a normal mood and affect. His behavior is normal.     LABS: Results for orders placed or performed during the hospital encounter of 12/14/15 (from the past 48 hour(s))  Basic metabolic panel     Status: Abnormal   Collection Time: 12/14/15  6:35 AM  Result Value Ref Range   Sodium 132 (L) 135 - 145 mmol/L   Potassium 3.6 3.5 - 5.1 mmol/L   Chloride 93 (L) 101 - 111 mmol/L   CO2 29 22 - 32 mmol/L   Glucose, Bld 124 (H) 65 - 99 mg/dL   BUN 12 6 - 20 mg/dL   Creatinine, Ser 0.79 0.61 - 1.24 mg/dL   Calcium 8.6 (L) 8.9 - 10.3 mg/dL   GFR calc non Af Amer >60 >60  mL/min   GFR calc Af Amer >60 >60 mL/min    Comment: (NOTE) The eGFR has been calculated using the CKD EPI equation. This calculation has not been validated in all clinical situations. eGFR's persistently <60 mL/min signify possible Chronic Kidney Disease.    Anion gap 10 5 - 15  CBC     Status: Abnormal   Collection Time: 12/14/15  6:35 AM  Result Value Ref Range   WBC 15.9 (H) 4.0 - 10.5 K/uL   RBC 3.83 (L) 4.22 - 5.81 MIL/uL   Hemoglobin 11.7 (L) 13.0 - 17.0 g/dL   HCT 35.7 (L) 39.0 - 52.0 %   MCV 93.2 78.0 - 100.0 fL   MCH 30.5 26.0 - 34.0 pg   MCHC 32.8 30.0 - 36.0 g/dL   RDW 14.2 11.5 - 15.5 %   Platelets 291 150 - 400 K/uL  Hepatic function panel     Status: Abnormal   Collection Time: 12/14/15  6:35 AM  Result Value Ref Range   Total Protein 7.9 6.5 - 8.1 g/dL   Albumin 1.9 (L) 3.5 - 5.0 g/dL   AST 44 (H) 15 - 41 U/L   ALT 28 17 - 63 U/L   Alkaline Phosphatase 183 (H) 38 - 126 U/L   Total Bilirubin 0.7 0.3 - 1.2 mg/dL   Bilirubin, Direct 0.1 0.1 - 0.5 mg/dL   Indirect Bilirubin 0.6 0.3 - 0.9 mg/dL  Brain  natriuretic peptide     Status: Abnormal   Collection Time: 12/14/15  6:35 AM  Result Value Ref Range   B Natriuretic Peptide 227.5 (H) 0.0 - 100.0 pg/mL  CBG monitoring, ED     Status: Abnormal   Collection Time: 12/14/15  6:38 AM  Result Value Ref Range   Glucose-Capillary 138 (H) 65 - 99 mg/dL  Blood culture (routine x 2)     Status: None (Preliminary result)   Collection Time: 12/14/15  8:50 AM  Result Value Ref Range   Specimen Description BLOOD LEFT ARM    Special Requests BOTTLES DRAWN AEROBIC AND ANAEROBIC 10CC    Culture  Setup Time      IN BOTH AEROBIC AND ANAEROBIC BOTTLES GRAM POSITIVE COCCI IN CLUSTERS CRITICAL RESULT CALLED TO, READ BACK BY AND VERIFIED WITH: TO TO SGULLETT(PHARD) BY MCAMPBELL 12/15/2015 AT 5:55AM    Culture PENDING    Report Status PENDING   Blood culture (routine x 2)     Status: None (Preliminary result)   Collection Time: 12/14/15  9:00 AM  Result Value Ref Range   Specimen Description BLOOD RIGHT HAND    Special Requests BOTTLES DRAWN AEROBIC AND ANAEROBIC 5CC    Culture  Setup Time      AEROBIC BOTTLE ONLY GRAM POSITIVE COCCI IN CLUSTERS Organism ID to follow CRITICAL RESULT CALLED TO, READ BACK BY AND VERIFIED WITH: TO TO SGULLETT(PHARD) BY MCAMPBELL 12/15/2015 AT 5:55AM    Culture PENDING    Report Status PENDING   Blood Culture ID Panel (Reflexed)     Status: Abnormal   Collection Time: 12/14/15  9:00 AM  Result Value Ref Range   Enterococcus species NOT DETECTED NOT DETECTED   Vancomycin resistance NOT DETECTED NOT DETECTED   Listeria monocytogenes NOT DETECTED NOT DETECTED   Staphylococcus species DETECTED (A) NOT DETECTED    Comment: CRITICAL RESULT CALLED TO, READ BACK BY AND VERIFIED WITH: TO SGULLETT(PHARD) BY MCAMPBELL 12/15/2015 AT 5:55AM    Staphylococcus aureus DETECTED (A) NOT DETECTED    Comment:  CRITICAL RESULT CALLED TO, READ BACK BY AND VERIFIED WITH: TO SGULLETT(PHARD) BY MCAMPBELL 12/15/2015 AT 5:55AM     Methicillin resistance NOT DETECTED NOT DETECTED   Streptococcus species NOT DETECTED NOT DETECTED   Streptococcus agalactiae NOT DETECTED NOT DETECTED   Streptococcus pneumoniae NOT DETECTED NOT DETECTED   Streptococcus pyogenes NOT DETECTED NOT DETECTED   Acinetobacter baumannii NOT DETECTED NOT DETECTED   Enterobacteriaceae species NOT DETECTED NOT DETECTED   Enterobacter cloacae complex NOT DETECTED NOT DETECTED   Escherichia coli NOT DETECTED NOT DETECTED   Klebsiella oxytoca NOT DETECTED NOT DETECTED   Klebsiella pneumoniae NOT DETECTED NOT DETECTED   Proteus species NOT DETECTED NOT DETECTED   Serratia marcescens NOT DETECTED NOT DETECTED   Carbapenem resistance NOT DETECTED NOT DETECTED   Haemophilus influenzae NOT DETECTED NOT DETECTED   Neisseria meningitidis NOT DETECTED NOT DETECTED   Pseudomonas aeruginosa NOT DETECTED NOT DETECTED   Candida albicans NOT DETECTED NOT DETECTED   Candida glabrata NOT DETECTED NOT DETECTED   Candida krusei NOT DETECTED NOT DETECTED   Candida parapsilosis NOT DETECTED NOT DETECTED   Candida tropicalis NOT DETECTED NOT DETECTED  I-stat troponin, ED     Status: None   Collection Time: 12/14/15  9:04 AM  Result Value Ref Range   Troponin i, poc 0.00 0.00 - 0.08 ng/mL   Comment 3            Comment: Due to the release kinetics of cTnI, a negative result within the first hours of the onset of symptoms does not rule out myocardial infarction with certainty. If myocardial infarction is still suspected, repeat the test at appropriate intervals.   I-Stat CG4 Lactic Acid, ED     Status: None   Collection Time: 12/14/15  9:06 AM  Result Value Ref Range   Lactic Acid, Venous 1.07 0.5 - 1.9 mmol/L  I-Stat CG4 Lactic Acid, ED     Status: None   Collection Time: 12/14/15 11:51 AM  Result Value Ref Range   Lactic Acid, Venous 1.54 0.5 - 1.9 mmol/L  Urinalysis, Routine w reflex microscopic     Status: Abnormal   Collection Time: 12/14/15  4:22 PM    Result Value Ref Range   Color, Urine YELLOW YELLOW   APPearance CLOUDY (A) CLEAR   Specific Gravity, Urine 1.020 1.005 - 1.030   pH 7.0 5.0 - 8.0   Glucose, UA NEGATIVE NEGATIVE mg/dL   Hgb urine dipstick MODERATE (A) NEGATIVE   Bilirubin Urine NEGATIVE NEGATIVE   Ketones, ur NEGATIVE NEGATIVE mg/dL   Protein, ur 100 (A) NEGATIVE mg/dL   Nitrite POSITIVE (A) NEGATIVE   Leukocytes, UA SMALL (A) NEGATIVE  Urine microscopic-add on     Status: Abnormal   Collection Time: 12/14/15  4:22 PM  Result Value Ref Range   Squamous Epithelial / LPF 0-5 (A) NONE SEEN   WBC, UA 6-30 0 - 5 WBC/hpf   RBC / HPF 6-30 0 - 5 RBC/hpf   Bacteria, UA FEW (A) NONE SEEN  TSH     Status: None   Collection Time: 12/14/15  5:07 PM  Result Value Ref Range   TSH 0.469 0.350 - 4.500 uIU/mL    Comment: Performed by a 3rd Generation assay with a functional sensitivity of <=0.01 uIU/mL.  Protime-INR     Status: None   Collection Time: 12/14/15  5:21 PM  Result Value Ref Range   Prothrombin Time 13.7 11.4 - 15.2 seconds   INR 1.04  MRSA PCR Screening     Status: None   Collection Time: 12/14/15 11:57 PM  Result Value Ref Range   MRSA by PCR NEGATIVE NEGATIVE    Comment:        The GeneXpert MRSA Assay (FDA approved for NASAL specimens only), is one component of a comprehensive MRSA colonization surveillance program. It is not intended to diagnose MRSA infection nor to guide or monitor treatment for MRSA infections.   Basic metabolic panel     Status: Abnormal   Collection Time: 12/15/15  3:51 AM  Result Value Ref Range   Sodium 132 (L) 135 - 145 mmol/L   Potassium 3.6 3.5 - 5.1 mmol/L   Chloride 96 (L) 101 - 111 mmol/L   CO2 28 22 - 32 mmol/L   Glucose, Bld 110 (H) 65 - 99 mg/dL   BUN 16 6 - 20 mg/dL   Creatinine, Ser 0.86 0.61 - 1.24 mg/dL   Calcium 8.6 (L) 8.9 - 10.3 mg/dL   GFR calc non Af Amer >60 >60 mL/min   GFR calc Af Amer >60 >60 mL/min    Comment: (NOTE) The eGFR has been  calculated using the CKD EPI equation. This calculation has not been validated in all clinical situations. eGFR's persistently <60 mL/min signify possible Chronic Kidney Disease.    Anion gap 8 5 - 15  CBC     Status: Abnormal   Collection Time: 12/15/15  3:51 AM  Result Value Ref Range   WBC 13.1 (H) 4.0 - 10.5 K/uL   RBC 3.49 (L) 4.22 - 5.81 MIL/uL   Hemoglobin 10.5 (L) 13.0 - 17.0 g/dL   HCT 32.7 (L) 39.0 - 52.0 %   MCV 93.7 78.0 - 100.0 fL   MCH 30.1 26.0 - 34.0 pg   MCHC 32.1 30.0 - 36.0 g/dL   RDW 14.4 11.5 - 15.5 %   Platelets 275 150 - 400 K/uL  Lipid panel     Status: Abnormal   Collection Time: 12/15/15  3:51 AM  Result Value Ref Range   Cholesterol 52 0 - 200 mg/dL   Triglycerides 61 <150 mg/dL   HDL 10 (L) >40 mg/dL   Total CHOL/HDL Ratio 5.2 RATIO   VLDL 12 0 - 40 mg/dL   LDL Cholesterol 30 0 - 99 mg/dL    Comment:        Total Cholesterol/HDL:CHD Risk Coronary Heart Disease Risk Table                     Men   Women  1/2 Average Risk   3.4   3.3  Average Risk       5.0   4.4  2 X Average Risk   9.6   7.1  3 X Average Risk  23.4   11.0        Use the calculated Patient Ratio above and the CHD Risk Table to determine the patient's CHD Risk.        ATP III CLASSIFICATION (LDL):  <100     mg/dL   Optimal  100-129  mg/dL   Near or Above                    Optimal  130-159  mg/dL   Borderline  160-189  mg/dL   High  >190     mg/dL   Very High     MICRO: 10/14 blood cx MSSA IMAGING: Ct Abdomen Pelvis Wo Contrast  Result  Date: 12/14/2015 CLINICAL DATA:  Abdominal distention and pain. Shortness of breath. Decreased appetite. EXAM: CT CHEST, ABDOMEN AND PELVIS WITHOUT CONTRAST TECHNIQUE: Multidetector CT imaging of the chest, abdomen and pelvis was performed following the standard protocol without IV contrast. COMPARISON:  CT abdomen pelvis 12/01/2015 and CT chest 07/07/2013. FINDINGS: CT CHEST FINDINGS Cardiovascular: Atherosclerotic calcification of the  arterial vasculature, including three-vessel involvement of the coronary arteries. Heart size normal. There is pericardial calcification, indicative of prior pericarditis. No pericardial effusion. Mediastinum/Nodes: No pathologically enlarged mediastinal or axillary lymph nodes. Hilar regions are difficult to definitively evaluate without IV contrast. Esophagus is grossly unremarkable. Lungs/Pleura: Mild centrilobular emphysema. Rounded masslike consolidation in the medial aspect of the superior segment right lower lobe measures approximately 4.7 x 4.9 cm, is new from 07/07/2013, has internal ossific/calcific debris and obscures the extrapleural fat plane. Soft tissue extends around the adjacent vertebral body into the left extrapleural space. Trace right pleural effusion. Lungs are otherwise clear. Musculoskeletal: Lucency and destruction of the T7 vertebral body is seen with surrounding soft tissue. There is lysis within the anterior aspect of the T6 vertebral body as well. Airway is unremarkable. CT ABDOMEN PELVIS FINDINGS Hepatobiliary: Liver and gallbladder are unremarkable. No biliary ductal dilatation. Pancreas: Negative. Spleen: Negative. Adrenals/Urinary Tract: Adrenal glands are unremarkable. Inferior aspects of both kidneys are obscured by streak artifact from spinal hardware. Low-attenuation lesions in the left kidney measure up to 2.0 cm and are difficult to further characterize without post-contrast imaging. Ureters are decompressed. There are scattered bladder diverticulum. Stomach/Bowel: Stomach, small bowel, appendix and colon are unremarkable. Vascular/Lymphatic: Atherosclerotic calcification of the arterial vasculature without abdominal aortic aneurysm. Postoperative changes are seen at the aortic bifurcation. No pathologically enlarged lymph nodes. Reproductive: Prostate is slightly enlarged and calcified. Other: No free fluid.  Mesenteries and peritoneum are unremarkable. Musculoskeletal:  Postoperative changes in the lumbar spine. No additional worrisome lytic or sclerotic lesions. Bone graft harvest sites in the iliac wings. IMPRESSION: 1. Masslike consolidation in the right lower lobe with invasion of the extrapleural fat, worrisome for primary bronchogenic carcinoma. Lymphoma and sarcoma are considered less likely. 2. Lytic destruction and compression of the T7 vertebral body with surrounding soft tissue, consistent with a pathologic fracture. Minimal involvement of the T6 vertebral body. 3. Trace right pleural effusion. 4. No evidence of primary malignancy or metastatic disease in the abdomen or pelvis. 5.  Aortic atherosclerosis (ICD10-170.0). Electronically Signed   By: Lorin Picket M.D.   On: 12/14/2015 14:21   Ct Head Wo Contrast  Result Date: 12/14/2015 CLINICAL DATA:  Generalized weakness, headache and photosensitivity. Decreased appetite. Fall yesterday with loss of sensation in right leg, initial encounter. EXAM: CT HEAD WITHOUT CONTRAST TECHNIQUE: Contiguous axial images were obtained from the base of the skull through the vertex without intravenous contrast. COMPARISON:  None. FINDINGS: Brain: No evidence of an acute infarct, acute hemorrhage, mass lesion, mass effect or hydrocephalus. Small area of encephalomalacia in the left occipital lobe, stable. Atrophy. Periventricular low attenuation. Vascular: No hyperdense vessel or unexpected calcification. Skull: Normal. Negative for fracture or focal lesion. Sinuses/Orbits: No acute finding. Other: None. IMPRESSION: 1. No acute intracranial abnormality. 2. Atrophy and chronic microvascular white matter ischemic changes. 3. Small area of encephalomalacia in the left occipital lobe. Electronically Signed   By: Lorin Picket M.D.   On: 12/14/2015 10:31   Ct Chest Wo Contrast  Result Date: 12/14/2015 CLINICAL DATA:  Abdominal distention and pain. Shortness of breath. Decreased appetite. EXAM: CT CHEST, ABDOMEN  AND PELVIS WITHOUT  CONTRAST TECHNIQUE: Multidetector CT imaging of the chest, abdomen and pelvis was performed following the standard protocol without IV contrast. COMPARISON:  CT abdomen pelvis 12/01/2015 and CT chest 07/07/2013. FINDINGS: CT CHEST FINDINGS Cardiovascular: Atherosclerotic calcification of the arterial vasculature, including three-vessel involvement of the coronary arteries. Heart size normal. There is pericardial calcification, indicative of prior pericarditis. No pericardial effusion. Mediastinum/Nodes: No pathologically enlarged mediastinal or axillary lymph nodes. Hilar regions are difficult to definitively evaluate without IV contrast. Esophagus is grossly unremarkable. Lungs/Pleura: Mild centrilobular emphysema. Rounded masslike consolidation in the medial aspect of the superior segment right lower lobe measures approximately 4.7 x 4.9 cm, is new from 07/07/2013, has internal ossific/calcific debris and obscures the extrapleural fat plane. Soft tissue extends around the adjacent vertebral body into the left extrapleural space. Trace right pleural effusion. Lungs are otherwise clear. Musculoskeletal: Lucency and destruction of the T7 vertebral body is seen with surrounding soft tissue. There is lysis within the anterior aspect of the T6 vertebral body as well. Airway is unremarkable. CT ABDOMEN PELVIS FINDINGS Hepatobiliary: Liver and gallbladder are unremarkable. No biliary ductal dilatation. Pancreas: Negative. Spleen: Negative. Adrenals/Urinary Tract: Adrenal glands are unremarkable. Inferior aspects of both kidneys are obscured by streak artifact from spinal hardware. Low-attenuation lesions in the left kidney measure up to 2.0 cm and are difficult to further characterize without post-contrast imaging. Ureters are decompressed. There are scattered bladder diverticulum. Stomach/Bowel: Stomach, small bowel, appendix and colon are unremarkable. Vascular/Lymphatic: Atherosclerotic calcification of the arterial  vasculature without abdominal aortic aneurysm. Postoperative changes are seen at the aortic bifurcation. No pathologically enlarged lymph nodes. Reproductive: Prostate is slightly enlarged and calcified. Other: No free fluid.  Mesenteries and peritoneum are unremarkable. Musculoskeletal: Postoperative changes in the lumbar spine. No additional worrisome lytic or sclerotic lesions. Bone graft harvest sites in the iliac wings. IMPRESSION: 1. Masslike consolidation in the right lower lobe with invasion of the extrapleural fat, worrisome for primary bronchogenic carcinoma. Lymphoma and sarcoma are considered less likely. 2. Lytic destruction and compression of the T7 vertebral body with surrounding soft tissue, consistent with a pathologic fracture. Minimal involvement of the T6 vertebral body. 3. Trace right pleural effusion. 4. No evidence of primary malignancy or metastatic disease in the abdomen or pelvis. 5.  Aortic atherosclerosis (ICD10-170.0). Electronically Signed   By: Lorin Picket M.D.   On: 12/14/2015 14:21   Dg Chest Port 1 View  Result Date: 12/14/2015 CLINICAL DATA:  75 year old male with a history of chest pain EXAM: PORTABLE CHEST 1 VIEW COMPARISON:  11/28/2015 FINDINGS: Double density in the right hilum, new from the comparison. No pleural effusion or pneumothorax. No confluent airspace disease. No displaced fracture. IMPRESSION: Double density in the right hilum, new from the comparison plain film in September. Given the rapid development, this may reflect a developing infection in the medial lung, however, contrast-enhanced CT is recommended to rule out adenopathy or mass. Signed, Dulcy Fanny. Earleen Newport, DO [facility] Electronically Signed   By: Corrie Mckusick D.O.   On: 12/14/2015 09:28    Assessment/Plan:  18 yoM with multipe comorbidities who is admitted for malaise, fever, shortness of breath found to have leukocytosis, right lung  mass, thoracic lytic lesion and [name]  [name] = continue on cefazolin 2gm IV q 8hr. Repeat blood cx tomorrow to document clearance. Recommend to get TTE and MRI of thoracic spine to have better visualization of spine to see if more consistent with  osteomyelitis vs. Pathologic fracture  Pneumonia = does not have parenchymal disease other than mass which ? Could be malignancy vs. Abscess though it does not have appearance of abscess with any rind appearance. Continue on cefazolin. I would d/c ceftriaxone and azithromycin  Lytic lesion = concern that he may have thoracic osteomyelitis, please get mri of thoracic spine  Lung mass = concern for malignancy since cxr in march appear to be normal though less sensitive than CT.  Will provide further recs tomorrow. I have discussed case with Dr Alfredia Ferguson.  Elzie Rings [facility] [phone]

## 2015-12-15 NOTE — Progress Notes (Signed)
PHARMACY - PHYSICIAN COMMUNICATION CRITICAL VALUE ALERT - BLOOD CULTURE IDENTIFICATION (BCID)  Results for orders placed or performed during the hospital encounter of 12/14/15  Blood Culture ID Panel (Reflexed) (Collected: 12/14/2015  9:00 AM)  Result Value Ref Range   Enterococcus species NOT DETECTED NOT DETECTED   Vancomycin resistance NOT DETECTED NOT DETECTED   Listeria monocytogenes NOT DETECTED NOT DETECTED   Staphylococcus species DETECTED (A) NOT DETECTED   Staphylococcus aureus DETECTED (A) NOT DETECTED   Methicillin resistance NOT DETECTED NOT DETECTED   Streptococcus species NOT DETECTED NOT DETECTED   Streptococcus agalactiae NOT DETECTED NOT DETECTED   Streptococcus pneumoniae NOT DETECTED NOT DETECTED   Streptococcus pyogenes NOT DETECTED NOT DETECTED   Acinetobacter baumannii NOT DETECTED NOT DETECTED   Enterobacteriaceae species NOT DETECTED NOT DETECTED   Enterobacter cloacae complex NOT DETECTED NOT DETECTED   Escherichia coli NOT DETECTED NOT DETECTED   Klebsiella oxytoca NOT DETECTED NOT DETECTED   Klebsiella pneumoniae NOT DETECTED NOT DETECTED   Proteus species NOT DETECTED NOT DETECTED   Serratia marcescens NOT DETECTED NOT DETECTED   Carbapenem resistance NOT DETECTED NOT DETECTED   Haemophilus influenzae NOT DETECTED NOT DETECTED   Neisseria meningitidis NOT DETECTED NOT DETECTED   Pseudomonas aeruginosa NOT DETECTED NOT DETECTED   Candida albicans NOT DETECTED NOT DETECTED   Candida glabrata NOT DETECTED NOT DETECTED   Candida krusei NOT DETECTED NOT DETECTED   Candida parapsilosis NOT DETECTED NOT DETECTED   Candida tropicalis NOT DETECTED NOT DETECTED    Name of physician (or Provider) Contacted:   Dr. Alfredia Ferguson  Changes to prescribed antibiotics required:   Recommend increasing Rocephin 2 g IV q24h if PNA coverage needed, F/U ID consult  If PNA coverage unnecessary, recommend change ABX to Ancef 2g IV q8h  Caryl Pina 12/15/2015   6:03 AM

## 2015-12-15 NOTE — Progress Notes (Signed)
PROGRESS NOTE    Marcus [name]  [id number] DOB: 12-Mar-1940 DOA: 12/14/2015 PCP: Jeanmarie Hubert, MD  Brief Narrative:  75 y.o. male with medical history significant for urethral stricture, hypertension, diabetes, TIA, GERD, BPH, tremor, since to the emergency Department chief complaint of generalized weakness failure to thrive recent fall. Initial evaluation reveals right lower lobe lung mass, closed fracture of 7 thoracic vertebrae, urinary retention, A. fib with RVR. He was worked up and found to have an [name]. Biopsy of Lung mass in Am.   Assessment & Plan:   Principal Problem:   Atrial fibrillation with rapid ventricular response (HCC) Active Problems:   Essential hypertension   Carotid stenosis   Diabetes (HCC)   HLD (hyperlipidemia)   CVA (cerebral vascular accident) ([place])   Fall   CAP (community acquired pneumonia)   Compression fracture of vertebra ([place])   Urinary retention   New onset a-fib ([place])   Right lower lobe lung mass   Lower extremity weakness  1. [name] -WBC was 13.1, repeat CBC in AM -Unclear Source -C/w Cefazolin per ID Recc's -Repeat Blood Cx's -Attempted to get MRI of Thoracic and Lumbar Spin but patient has Back Stimulator -Will send off Anerobic Samples from Biopsy  2. Right Lower Lobe Consolidation Concerning for Bronchogenic Malignancy, Unclear if Patient has underlying PNA -Worsening Malaise and Weakness along with SOB -RLL 4.7 x 5 cm mass like consolidation  -CT Scan done -IR to biopsy tomorrow and send for analysis -Family admits to Early Satiety, Weight Loss and Pain out of Proportion  3. Atrial Fibrillation with RVR s/p Conversion to NSR -Cardizem gtt -Cardiology Evaluation and Recc's appreciated -Hold off Anticoagulation for Procedure -TTEchocardiogram pending -C/w Low dose Oral BB with Metoprolol po BID  4. Compression Fx of T7 with Lytic Lesions -Concerning for possible metastatic malignancy.  -Lower  extremities with decreased strength and decreased sensation. -Pain management -Physical therapy  5. Diabetes Mellitus Type 2.  -Diet controlled. -Serum glucose 124 on admission. -Monitor  6. Hypertension.  -Patient quite hypotensive in the emergency department.  -Home medications include no antihypertensives -Monitor closely  7. Urinary retention with Hx of BPH and   -Patient with a history of urethral stricture.  -Bedside bladder scan revealed greater than 500 mL urine. -Foley passed per urology -Monitor urine output  8. History of CVA from [facility]  -Has some residual right-sided weakness. On Plavix. -Hold Plavix for now  9. Lower extremity weakness.  -Some decreased sensation in exam. History of fusion 1991 per [name] -Unable to get MRI due to hardware -PT/OT to Eval and Treat   DVT prophylaxis: (Lovenox/Heparin/SCD's/anticoagulated/None (if comfort care) Code Status: (Full/Partial - specify details) Family Communication: (Specify name, relationship & date discussed. NO "discussed with patient") Disposition Plan: (specify when and where you expect patient to be discharged). Include barriers to DC in this tab.  Consultants:   Cardiology  Urology  Interventional Radiology  Infectious Diseases  Procedures: CT Guided Biopsy in AM  Antimicrobials: Cefazolin  Subjective: Seen and examined and is cachectic and in Pain. Slightly confused. Admitted to being SOB and extremely weak. No Cp, Lightheadedness or dizziness. Was informed about biopsy tomorrow and agreed. Discussed and updated the family of plan extensively and answered all their questions to Satisfaction.   Objective: Vitals:   12/15/15 0554 12/15/15 0746 12/15/15 1130 12/15/15 1240  BP: 115/66   116/65  Pulse: 64 66 67 74  Resp: (!) 25 (!) 21 19   Temp: 98.5  F (36.9 C) 98.6 F (37 C) 98.2 F (36.8 C)   TempSrc: Oral Oral Oral   SpO2: 96% 97% 98%   Weight: 94.6 kg (208 lb 9.6 oz)       Height:        Intake/Output Summary (Last 24 hours) at 12/15/15 1451 Last data filed at 12/15/15 0900  Gross per 24 hour  Intake          2819.17 ml  Output             1225 ml  Net          1594.17 ml   Filed Weights   12/14/15 2303 12/15/15 0554  Weight: 94.1 kg (207 lb 6.4 oz) 94.6 kg (208 lb 9.6 oz)    Examination: Physical Exam:  Constitutional: Ill appearing 75 year old male who appears uncompfortable Eyes: Lids and conjunctivae normal, sclerae anicteric  ENMT: External Ears, Nose appear normal. Grossly normal hearing.  Neck: Appears normal, supple, no cervical masses, normal ROM, no appreciable thyromegaly Respiratory: Diminished bilaterally, no wheezing, rales, rhonchi or crackles. Normal respiratory effort and patient is not tachypenic. No accessory muscle use.  Cardiovascular: RRR, no murmurs / rubs / gallops. S1 and S2 auscultated. No extremity edema. Abdomen: Soft, non-tender, non-distended. No masses palpated. No appreciable hepatosplenomegaly. Bowel sounds positive.  GU: Deferred. Musculoskeletal: No clubbing / cyanosis of digits/nails. No joint deformity upper and lower extremities. No contractures. Decreased strength and muscle tone.  Skin: No rashes, lesions, ulcers. No induration; Warm and dry.  Neurologic: CN 2-12 grossly intact with no focal deficits. Sensation intact in all 4 Extremities,  Romberg sign cerebellar reflexes not assessed.  Psychiatric: Normal judgment and insight. Alert and oriented x 3. Depressed mood and flat affect.   Data Reviewed: I have personally reviewed following labs and imaging studies  CBC:  Recent Labs Lab 12/14/15 0635 12/15/15 0351  WBC 15.9* 13.1*  HGB 11.7* 10.5*  HCT 35.7* 32.7*  MCV 93.2 93.7  PLT 291 [id number]   Basic Metabolic Panel:  Recent Labs Lab 12/14/15 0635 12/15/15 0351  NA 132* 132*  K 3.6 3.6  CL 93* 96*  CO2 29 28  GLUCOSE 124* 110*  BUN 12 16  CREATININE 0.79 0.86  CALCIUM 8.6* 8.6*    GFR: Estimated Creatinine Clearance: 85.9 mL/min (by C-G formula based on SCr of 0.86 mg/dL). Liver Function Tests:  Recent Labs Lab 12/14/15 0635  AST 44*  ALT 28  ALKPHOS 183*  BILITOT 0.7  PROT 7.9  ALBUMIN 1.9*   No results for input(s): LIPASE, AMYLASE in the last 168 hours. No results for input(s): AMMONIA in the last 168 hours. Coagulation Profile:  Recent Labs Lab 12/14/15 1721  INR 1.04   Cardiac Enzymes: No results for input(s): CKTOTAL, CKMB, CKMBINDEX, TROPONINI in the last 168 hours. BNP (last 3 results) No results for input(s): PROBNP in the last 8760 hours. HbA1C: No results for input(s): HGBA1C in the last 72 hours. CBG:  Recent Labs Lab 12/14/15 0638  GLUCAP 138*   Lipid Profile:  Recent Labs  12/15/15 0351  CHOL 52  HDL 10*  LDLCALC 30  TRIG 61  CHOLHDL 5.2   Thyroid Function Tests:  Recent Labs  12/14/15 1707  TSH 0.469   Anemia Panel: No results for input(s): VITAMINB12, FOLATE, FERRITIN, TIBC, IRON, RETICCTPCT in the last 72 hours. Sepsis Labs:  Recent Labs Lab 12/14/15 0906 12/14/15 1151  LATICACIDVEN 1.07 1.54    Recent Results (from the  past 240 hour(s))  Blood culture (routine x 2)     Status: None (Preliminary result)   Collection Time: 12/14/15  8:50 AM  Result Value Ref Range Status   Specimen Description BLOOD LEFT ARM  Final   Special Requests BOTTLES DRAWN AEROBIC AND ANAEROBIC 10CC  Final   Culture  Setup Time   Final    IN BOTH AEROBIC AND ANAEROBIC BOTTLES GRAM POSITIVE COCCI IN CLUSTERS CRITICAL RESULT CALLED TO, READ BACK BY AND VERIFIED WITH: TO TO SGULLETT(PHARD) BY MCAMPBELL 12/15/2015 AT 5:55AM    Culture TOO YOUNG TO READ  Final   Report Status PENDING  Incomplete  Blood culture (routine x 2)     Status: None (Preliminary result)   Collection Time: 12/14/15  9:00 AM  Result Value Ref Range Status   Specimen Description BLOOD RIGHT HAND  Final   Special Requests BOTTLES DRAWN AEROBIC AND  ANAEROBIC 5CC  Final   Culture  Setup Time   Final    IN BOTH AEROBIC AND ANAEROBIC BOTTLES GRAM POSITIVE COCCI IN CLUSTERS Organism ID to follow CRITICAL RESULT CALLED TO, READ BACK BY AND VERIFIED WITH: TO TO SGULLETT(PHARD) BY MCAMPBELL 12/15/2015 AT 5:55AM    Culture TOO YOUNG TO READ  Final   Report Status PENDING  Incomplete  Blood Culture ID Panel (Reflexed)     Status: Abnormal   Collection Time: 12/14/15  9:00 AM  Result Value Ref Range Status   Enterococcus species NOT DETECTED NOT DETECTED Final   Vancomycin resistance NOT DETECTED NOT DETECTED Final   Listeria monocytogenes NOT DETECTED NOT DETECTED Final   Staphylococcus species DETECTED (A) NOT DETECTED Final    Comment: CRITICAL RESULT CALLED TO, READ BACK BY AND VERIFIED WITH: TO SGULLETT(PHARD) BY MCAMPBELL 12/15/2015 AT 5:55AM    Staphylococcus aureus DETECTED (A) NOT DETECTED Final    Comment: CRITICAL RESULT CALLED TO, READ BACK BY AND VERIFIED WITH: TO SGULLETT(PHARD) BY MCAMPBELL 12/15/2015 AT 5:55AM    Methicillin resistance NOT DETECTED NOT DETECTED Final   Streptococcus species NOT DETECTED NOT DETECTED Final   Streptococcus agalactiae NOT DETECTED NOT DETECTED Final   Streptococcus pneumoniae NOT DETECTED NOT DETECTED Final   Streptococcus pyogenes NOT DETECTED NOT DETECTED Final   Acinetobacter baumannii NOT DETECTED NOT DETECTED Final   Enterobacteriaceae species NOT DETECTED NOT DETECTED Final   Enterobacter cloacae complex NOT DETECTED NOT DETECTED Final   Escherichia coli NOT DETECTED NOT DETECTED Final   Klebsiella oxytoca NOT DETECTED NOT DETECTED Final   Klebsiella pneumoniae NOT DETECTED NOT DETECTED Final   Proteus species NOT DETECTED NOT DETECTED Final   Serratia marcescens NOT DETECTED NOT DETECTED Final   Carbapenem resistance NOT DETECTED NOT DETECTED Final   Haemophilus influenzae NOT DETECTED NOT DETECTED Final   Neisseria meningitidis NOT DETECTED NOT DETECTED Final   Pseudomonas  aeruginosa NOT DETECTED NOT DETECTED Final   Candida albicans NOT DETECTED NOT DETECTED Final   Candida glabrata NOT DETECTED NOT DETECTED Final   Candida krusei NOT DETECTED NOT DETECTED Final   Candida parapsilosis NOT DETECTED NOT DETECTED Final   Candida tropicalis NOT DETECTED NOT DETECTED Final  MRSA PCR Screening     Status: None   Collection Time: 12/14/15 11:57 PM  Result Value Ref Range Status   MRSA by PCR NEGATIVE NEGATIVE Final    Comment:        The GeneXpert MRSA Assay (FDA approved for NASAL specimens only), is one component of a comprehensive MRSA colonization surveillance program. It  is not intended to diagnose MRSA infection nor to guide or monitor treatment for MRSA infections.     Radiology Studies: Ct Abdomen Pelvis Wo Contrast  Result Date: 12/14/2015 CLINICAL DATA:  Abdominal distention and pain. Shortness of breath. Decreased appetite. EXAM: CT CHEST, ABDOMEN AND PELVIS WITHOUT CONTRAST TECHNIQUE: Multidetector CT imaging of the chest, abdomen and pelvis was performed following the standard protocol without IV contrast. COMPARISON:  CT abdomen pelvis 12/01/2015 and CT chest 07/07/2013. FINDINGS: CT CHEST FINDINGS Cardiovascular: Atherosclerotic calcification of the arterial vasculature, including three-vessel involvement of the coronary arteries. Heart size normal. There is pericardial calcification, indicative of prior pericarditis. No pericardial effusion. Mediastinum/Nodes: No pathologically enlarged mediastinal or axillary lymph nodes. Hilar regions are difficult to definitively evaluate without IV contrast. Esophagus is grossly unremarkable. Lungs/Pleura: Mild centrilobular emphysema. Rounded masslike consolidation in the medial aspect of the superior segment right lower lobe measures approximately 4.7 x 4.9 cm, is new from 07/07/2013, has internal ossific/calcific debris and obscures the extrapleural fat plane. Soft tissue extends around the adjacent vertebral  body into the left extrapleural space. Trace right pleural effusion. Lungs are otherwise clear. Musculoskeletal: Lucency and destruction of the T7 vertebral body is seen with surrounding soft tissue. There is lysis within the anterior aspect of the T6 vertebral body as well. Airway is unremarkable. CT ABDOMEN PELVIS FINDINGS Hepatobiliary: Liver and gallbladder are unremarkable. No biliary ductal dilatation. Pancreas: Negative. Spleen: Negative. Adrenals/Urinary Tract: Adrenal glands are unremarkable. Inferior aspects of both kidneys are obscured by streak artifact from spinal hardware. Low-attenuation lesions in the left kidney measure up to 2.0 cm and are difficult to further characterize without post-contrast imaging. Ureters are decompressed. There are scattered bladder diverticulum. Stomach/Bowel: Stomach, small bowel, appendix and colon are unremarkable. Vascular/Lymphatic: Atherosclerotic calcification of the arterial vasculature without abdominal aortic aneurysm. Postoperative changes are seen at the aortic bifurcation. No pathologically enlarged lymph nodes. Reproductive: Prostate is slightly enlarged and calcified. Other: No free fluid.  Mesenteries and peritoneum are unremarkable. Musculoskeletal: Postoperative changes in the lumbar spine. No additional worrisome lytic or sclerotic lesions. Bone graft harvest sites in the iliac wings. IMPRESSION: 1. Masslike consolidation in the right lower lobe with invasion of the extrapleural fat, worrisome for primary bronchogenic carcinoma. Lymphoma and sarcoma are considered less likely. 2. Lytic destruction and compression of the T7 vertebral body with surrounding soft tissue, consistent with a pathologic fracture. Minimal involvement of the T6 vertebral body. 3. Trace right pleural effusion. 4. No evidence of primary malignancy or metastatic disease in the abdomen or pelvis. 5.  Aortic atherosclerosis (ICD10-170.0). Electronically Signed   By: Lorin Picket M.D.    On: 12/14/2015 14:21   Ct Head Wo Contrast  Result Date: 12/14/2015 CLINICAL DATA:  Generalized weakness, headache and photosensitivity. Decreased appetite. Fall yesterday with loss of sensation in right leg, initial encounter. EXAM: CT HEAD WITHOUT CONTRAST TECHNIQUE: Contiguous axial images were obtained from the base of the skull through the vertex without intravenous contrast. COMPARISON:  None. FINDINGS: Brain: No evidence of an acute infarct, acute hemorrhage, mass lesion, mass effect or hydrocephalus. Small area of encephalomalacia in the left occipital lobe, stable. Atrophy. Periventricular low attenuation. Vascular: No hyperdense vessel or unexpected calcification. Skull: Normal. Negative for fracture or focal lesion. Sinuses/Orbits: No acute finding. Other: None. IMPRESSION: 1. No acute intracranial abnormality. 2. Atrophy and chronic microvascular white matter ischemic changes. 3. Small area of encephalomalacia in the left occipital lobe. Electronically Signed   By: Lorin Picket M.D.  On: 12/14/2015 10:31   Ct Chest Wo Contrast  Result Date: 12/14/2015 CLINICAL DATA:  Abdominal distention and pain. Shortness of breath. Decreased appetite. EXAM: CT CHEST, ABDOMEN AND PELVIS WITHOUT CONTRAST TECHNIQUE: Multidetector CT imaging of the chest, abdomen and pelvis was performed following the standard protocol without IV contrast. COMPARISON:  CT abdomen pelvis 12/01/2015 and CT chest 07/07/2013. FINDINGS: CT CHEST FINDINGS Cardiovascular: Atherosclerotic calcification of the arterial vasculature, including three-vessel involvement of the coronary arteries. Heart size normal. There is pericardial calcification, indicative of prior pericarditis. No pericardial effusion. Mediastinum/Nodes: No pathologically enlarged mediastinal or axillary lymph nodes. Hilar regions are difficult to definitively evaluate without IV contrast. Esophagus is grossly unremarkable. Lungs/Pleura: Mild centrilobular  emphysema. Rounded masslike consolidation in the medial aspect of the superior segment right lower lobe measures approximately 4.7 x 4.9 cm, is new from 07/07/2013, has internal ossific/calcific debris and obscures the extrapleural fat plane. Soft tissue extends around the adjacent vertebral body into the left extrapleural space. Trace right pleural effusion. Lungs are otherwise clear. Musculoskeletal: Lucency and destruction of the T7 vertebral body is seen with surrounding soft tissue. There is lysis within the anterior aspect of the T6 vertebral body as well. Airway is unremarkable. CT ABDOMEN PELVIS FINDINGS Hepatobiliary: Liver and gallbladder are unremarkable. No biliary ductal dilatation. Pancreas: Negative. Spleen: Negative. Adrenals/Urinary Tract: Adrenal glands are unremarkable. Inferior aspects of both kidneys are obscured by streak artifact from spinal hardware. Low-attenuation lesions in the left kidney measure up to 2.0 cm and are difficult to further characterize without post-contrast imaging. Ureters are decompressed. There are scattered bladder diverticulum. Stomach/Bowel: Stomach, small bowel, appendix and colon are unremarkable. Vascular/Lymphatic: Atherosclerotic calcification of the arterial vasculature without abdominal aortic aneurysm. Postoperative changes are seen at the aortic bifurcation. No pathologically enlarged lymph nodes. Reproductive: Prostate is slightly enlarged and calcified. Other: No free fluid.  Mesenteries and peritoneum are unremarkable. Musculoskeletal: Postoperative changes in the lumbar spine. No additional worrisome lytic or sclerotic lesions. Bone graft harvest sites in the iliac wings. IMPRESSION: 1. Masslike consolidation in the right lower lobe with invasion of the extrapleural fat, worrisome for primary bronchogenic carcinoma. Lymphoma and sarcoma are considered less likely. 2. Lytic destruction and compression of the T7 vertebral body with surrounding soft tissue,  consistent with a pathologic fracture. Minimal involvement of the T6 vertebral body. 3. Trace right pleural effusion. 4. No evidence of primary malignancy or metastatic disease in the abdomen or pelvis. 5.  Aortic atherosclerosis (ICD10-170.0). Electronically Signed   By: Lorin Picket M.D.   On: 12/14/2015 14:21   Dg Chest Port 1 View  Result Date: 12/14/2015 CLINICAL DATA:  75 year old male with a history of chest pain EXAM: PORTABLE CHEST 1 VIEW COMPARISON:  11/28/2015 FINDINGS: Double density in the right hilum, new from the comparison. No pleural effusion or pneumothorax. No confluent airspace disease. No displaced fracture. IMPRESSION: Double density in the right hilum, new from the comparison plain film in September. Given the rapid development, this may reflect a developing infection in the medial lung, however, contrast-enhanced CT is recommended to rule out adenopathy or mass. Signed, Dulcy Fanny. Earleen Newport, DO [facility] Radiology Electronically Signed   By: Corrie Mckusick D.O.   On: 12/14/2015 09:28   Scheduled Meds: .  ceFAZolin (ANCEF) IV  2 g Intravenous Q8H  . metoprolol tartrate  12.5 mg Oral BID   Continuous Infusions:    LOS: 1 day   Kerney Elbe, DO [facility] Pager [phone]  If 7PM-7AM, please contact night-coverage [email or web address] Password [email or web address] 12/15/2015, 2:51 PM

## 2015-12-15 NOTE — Progress Notes (Signed)
Pt telemetry monitor indicating a flutter in th 160's x 3  Min then terminated to SR then immediately back to  afib then aflutter in the 120's. Pt BP 110/75. EKG obtained indicating a flutter. Bhavinkumar PA-c updated with new order. Will continue to monitor.

## 2015-12-16 ENCOUNTER — Inpatient Hospital Stay (HOSPITAL_COMMUNITY): Payer: Medicare Other

## 2015-12-16 ENCOUNTER — Ambulatory Visit
Admission: RE | Admit: 2015-12-16 | Discharge: 2015-12-16 | Disposition: A | Discharge status: Home / Self Care | Payer: Medicare Other | Source: Ambulatory Visit | Attending: Radiation Oncology | Admitting: Radiation Oncology

## 2015-12-16 DIAGNOSIS — S22069A Unspecified fracture of T7-T8 vertebra, initial encounter for closed fracture: Secondary | ICD-10-CM

## 2015-12-16 DIAGNOSIS — M4850XA Collapsed vertebra, not elsewhere classified, site unspecified, initial encounter for fracture: Secondary | ICD-10-CM

## 2015-12-16 DIAGNOSIS — R0602 Shortness of breath: Secondary | ICD-10-CM

## 2015-12-16 DIAGNOSIS — R208 Other disturbances of skin sensation: Secondary | ICD-10-CM

## 2015-12-16 DIAGNOSIS — R29898 Other symptoms and signs involving the musculoskeletal system: Secondary | ICD-10-CM

## 2015-12-16 DIAGNOSIS — I6529 Occlusion and stenosis of unspecified carotid artery: Secondary | ICD-10-CM

## 2015-12-16 DIAGNOSIS — R918 Other nonspecific abnormal finding of lung field: Secondary | ICD-10-CM

## 2015-12-16 DIAGNOSIS — S22069G Unspecified fracture of T7-T8 vertebra, subsequent encounter for fracture with delayed healing: Secondary | ICD-10-CM

## 2015-12-16 DIAGNOSIS — G893 Neoplasm related pain (acute) (chronic): Secondary | ICD-10-CM | POA: Insufficient documentation

## 2015-12-16 DIAGNOSIS — R7881 Bacteremia: Secondary | ICD-10-CM

## 2015-12-16 DIAGNOSIS — C7951 Secondary malignant neoplasm of bone: Secondary | ICD-10-CM | POA: Insufficient documentation

## 2015-12-16 LAB — COMPREHENSIVE METABOLIC PANEL
ALBUMIN: 1.5 g/dL — AB (ref 3.5–5.0)
ALK PHOS: 146 U/L — AB (ref 38–126)
ALT: 45 U/L (ref 17–63)
AST: 87 U/L — AB (ref 15–41)
Anion gap: 8 (ref 5–15)
BILIRUBIN TOTAL: 0.5 mg/dL (ref 0.3–1.2)
BUN: 22 mg/dL — AB (ref 6–20)
CALCIUM: 8.5 mg/dL — AB (ref 8.9–10.3)
CO2: 27 mmol/L (ref 22–32)
Chloride: 99 mmol/L — ABNORMAL LOW (ref 101–111)
Creatinine, Ser: 0.89 mg/dL (ref 0.61–1.24)
GFR calc Af Amer: >60 mL/min (ref >60)
GLUCOSE: 99 mg/dL (ref 65–99)
Potassium: 3.5 mmol/L (ref 3.5–5.1)
Sodium: 134 mmol/L — ABNORMAL LOW (ref 135–145)
TOTAL PROTEIN: 6 g/dL — AB (ref 6.5–8.1)

## 2015-12-16 LAB — CBC WITH DIFFERENTIAL/PLATELET
BASOS PCT: 0 %
Basophils Absolute: 0 10*3/uL (ref 0.0–0.1)
Eosinophils Absolute: 0.1 10*3/uL (ref 0.0–0.7)
Eosinophils Relative: 1 %
HEMATOCRIT: 33 % — AB (ref 39.0–52.0)
HEMOGLOBIN: 10.6 g/dL — AB (ref 13.0–17.0)
LYMPHS PCT: 7 %
Lymphs Abs: 0.8 10*3/uL (ref 0.7–4.0)
MCH: 30 pg (ref 26.0–34.0)
MCHC: 32.1 g/dL (ref 30.0–36.0)
MCV: 93.5 fL (ref 78.0–100.0)
MONO ABS: 0.8 10*3/uL (ref 0.1–1.0)
MONOS PCT: 8 %
NEUTROS ABS: 9.4 10*3/uL — AB (ref 1.7–7.7)
NEUTROS PCT: 84 %
Platelets: 286 10*3/uL (ref 150–400)
RBC: 3.53 MIL/uL — ABNORMAL LOW (ref 4.22–5.81)
RDW: 14.4 % (ref 11.5–15.5)
WBC: 11.1 10*3/uL — ABNORMAL HIGH (ref 4.0–10.5)

## 2015-12-16 LAB — MAGNESIUM: MAGNESIUM: 2.1 mg/dL (ref 1.7–2.4)

## 2015-12-16 LAB — PHOSPHORUS: Phosphorus: 3.2 mg/dL (ref 2.5–4.6)

## 2015-12-16 MED ORDER — SODIUM CHLORIDE 3 % IN NEBU
4.0000 mL | INHALATION_SOLUTION | Freq: Every day | RESPIRATORY_TRACT | Status: AC
  Administered 2015-12-16 – 2015-12-18 (×3): 4 mL via RESPIRATORY_TRACT
  Filled 2015-12-16 (×3): qty 4

## 2015-12-16 MED ORDER — DIGOXIN 0.25 MG/ML IJ SOLN
0.2500 mg | Freq: Once | INTRAMUSCULAR | Status: AC
  Administered 2015-12-16: 0.25 mg via INTRAVENOUS
  Filled 2015-12-16: qty 2

## 2015-12-16 MED ORDER — DIGOXIN 0.25 MG/ML IJ SOLN: 0.2500 mg | Freq: Once | INTRAMUSCULAR | Status: DC

## 2015-12-16 MED ORDER — IPRATROPIUM-ALBUTEROL 0.5-2.5 (3) MG/3ML IN SOLN
3.0000 mL | Freq: Three times a day (TID) | RESPIRATORY_TRACT | Status: DC
  Administered 2015-12-16 – 2015-12-17 (×2): 3 mL via RESPIRATORY_TRACT
  Filled 2015-12-16 (×2): qty 3

## 2015-12-16 MED ORDER — IPRATROPIUM-ALBUTEROL 0.5-2.5 (3) MG/3ML IN SOLN
3.0000 mL | Freq: Four times a day (QID) | RESPIRATORY_TRACT | Status: DC
  Administered 2015-12-16: 3 mL via RESPIRATORY_TRACT
  Filled 2015-12-16: qty 3

## 2015-12-16 MED ORDER — POTASSIUM CHLORIDE CRYS ER 20 MEQ PO TBCR
40.0000 meq | EXTENDED_RELEASE_TABLET | Freq: Once | ORAL | Status: AC
  Administered 2015-12-16: 40 meq via ORAL
  Filled 2015-12-16: qty 2

## 2015-12-16 MED ORDER — DIGOXIN 0.25 MG/ML IJ SOLN
0.5000 mg | Freq: Once | INTRAMUSCULAR | Status: AC
  Administered 2015-12-16: 0.5 mg via INTRAVENOUS
  Filled 2015-12-16: qty 2

## 2015-12-16 MED ORDER — HEPARIN SODIUM (PORCINE) 5000 UNIT/ML IJ SOLN
5000.0000 [IU] | Freq: Three times a day (TID) | INTRAMUSCULAR | Status: AC
  Administered 2015-12-16 – 2015-12-17 (×3): 5000 [IU] via SUBCUTANEOUS
  Filled 2015-12-16 (×3): qty 1

## 2015-12-16 MED ORDER — GUAIFENESIN-DM 100-10 MG/5ML PO SYRP
5.0000 mL | ORAL_SOLUTION | ORAL | Status: DC | PRN
  Administered 2015-12-16 – 2015-12-18 (×4): 5 mL via ORAL
  Filled 2015-12-16 (×4): qty 5

## 2015-12-16 MED ORDER — GADOBENATE DIMEGLUMINE 529 MG/ML IV SOLN
20.0000 mL | Freq: Once | INTRAVENOUS | Status: AC | PRN
  Administered 2015-12-16: 20 mL via INTRAVENOUS

## 2015-12-16 MED ORDER — LORAZEPAM 2 MG/ML IJ SOLN
2.0000 mg | Freq: Once | INTRAMUSCULAR | Status: AC
  Administered 2015-12-16: 2 mg via INTRAVENOUS

## 2015-12-16 MED ORDER — APIXABAN 5 MG PO TABS
5.0000 mg | ORAL_TABLET | Freq: Two times a day (BID) | ORAL | Status: DC
  Administered 2015-12-16: 5 mg via ORAL
  Filled 2015-12-16: qty 1

## 2015-12-16 MED ORDER — HYDROCODONE-ACETAMINOPHEN 10-325 MG PO TABS
1.0000 | ORAL_TABLET | ORAL | Status: DC | PRN
  Administered 2015-12-16 – 2015-12-24 (×31): 1 via ORAL
  Filled 2015-12-16 (×31): qty 1

## 2015-12-16 MED ORDER — LORAZEPAM 2 MG/ML IJ SOLN
INTRAMUSCULAR | Status: AC
  Filled 2015-12-16: qty 1

## 2015-12-16 MED ORDER — MORPHINE SULFATE (PF) 4 MG/ML IV SOLN
4.0000 mg | Freq: Once | INTRAVENOUS | Status: AC
  Administered 2015-12-16: 2 mg via INTRAVENOUS
  Filled 2015-12-16: qty 1

## 2015-12-16 MED ORDER — IPRATROPIUM-ALBUTEROL 0.5-2.5 (3) MG/3ML IN SOLN: 3.0000 mL | RESPIRATORY_TRACT | Status: DC | PRN

## 2015-12-16 MED ORDER — DEXAMETHASONE SODIUM PHOSPHATE 10 MG/ML IJ SOLN
10.0000 mg | Freq: Four times a day (QID) | INTRAMUSCULAR | Status: DC
  Administered 2015-12-16 – 2015-12-24 (×34): 10 mg via INTRAVENOUS
  Filled 2015-12-16 (×35): qty 1

## 2015-12-16 MED ORDER — SODIUM CHLORIDE 0.9 % IV SOLN
INTRAVENOUS | Status: DC
  Administered 2015-12-16 – 2015-12-18 (×3): via INTRAVENOUS

## 2015-12-16 MED ORDER — MORPHINE SULFATE (PF) 2 MG/ML IV SOLN
2.0000 mg | INTRAVENOUS | Status: DC | PRN
  Administered 2015-12-18 – 2015-12-24 (×3): 2 mg via INTRAVENOUS
  Filled 2015-12-16 (×3): qty 1

## 2015-12-16 NOTE — Progress Notes (Signed)
Patient Name: Marcus [name] Date of Encounter: 12/16/2015  Primary Cardiologist: Kate Sable (new)  Hospital Problem List     Principal Problem:   Atrial fibrillation with rapid ventricular response [facility]) Active Problems:   Essential hypertension   Carotid stenosis   Diabetes ([place])   HLD (hyperlipidemia)   CVA (cerebral vascular accident) ([place])   Fall   CAP (community acquired pneumonia)   Compression fracture of vertebra ([place])   Urinary retention   New onset a-fib ([place])   Right lower lobe lung mass   Lower extremity weakness   Bacteremia     Subjective   Concerned about his inability to move his legs. Denies pain or shortness of breath.    Inpatient Medications    Scheduled Meds: .  ceFAZolin (ANCEF) IV  2 g Intravenous Q8H  . LORazepam  0.5 mg Oral Daily  . timolol  1 drop Both Eyes BID   Continuous Infusions: . diltiazem (CARDIZEM) infusion 5 mg/hr (12/16/15 0815)   PRN Meds: acetaminophen, acetaminophen, guaiFENesin-dextromethorphan, HYDROcodone-acetaminophen, LORazepam, ondansetron (ZOFRAN) IV   Vital Signs    Vitals:   12/16/15 0500 12/16/15 0545 12/16/15 0600 12/16/15 0758  BP: 92/73 103/72 91/69 98/65   Pulse:  97  (!) 113  Resp: (!) 23 (!) 21 (!) 26 (!) 21  Temp:  97.7 F (36.5 C)  98.1 F (36.7 C)  TempSrc:  Oral  Oral  SpO2:  95%  94%  Weight:  205 lb 12.8 oz (93.4 kg)    Height:        Intake/Output Summary (Last 24 hours) at 12/16/15 0816 Last data filed at 12/16/15 [id number]  Gross per 24 hour  Intake          1319.34 ml  Output             1200 ml  Net           119.34 ml   Filed Weights   12/14/15 2303 12/15/15 0554 12/16/15 0545  Weight: 207 lb 6.4 oz (94.1 kg) 208 lb 9.6 oz (94.6 kg) 205 lb 12.8 oz (93.4 kg)    Physical Exam   GEN: Well nourished, well developed, in no acute distress.  HEENT: Grossly normal.  Neck: Supple, no JVD, carotid bruits, or masses. Cardiac: RRR, no murmurs, rubs, or gallops. No clubbing,  cyanosis, edema.  Radials/DP/PT 2+ and equal bilaterally.  Respiratory:  Respirations regular and unlabored, clear to auscultation bilaterally. GI: Soft, nontender, nondistended, BS + x 4. MS: no deformity or atrophy. Skin: warm and dry, no rash. Neuro:  Unable to move bilateral LEs. Psych: AAOx3.  Tearful affect.  Labs    CBC  Recent Labs  12/15/15 0351 12/16/15 0432  WBC 13.1* 11.1*  NEUTROABS  --  9.4*  HGB 10.5* 10.6*  HCT 32.7* 33.0*  MCV 93.7 93.5  PLT 275 [id number]   Basic Metabolic Panel  Recent Labs  12/15/15 0351 12/16/15 0432  NA 132* 134*  K 3.6 3.5  CL 96* 99*  CO2 28 27  GLUCOSE 110* 99  BUN 16 22*  CREATININE 0.86 0.89  CALCIUM 8.6* 8.5*  MG  --  2.1  PHOS  --  3.2   Liver Function Tests  Recent Labs  12/14/15 0635 12/16/15 0432  AST 44* 87*  ALT 28 45  ALKPHOS 183* 146*  BILITOT 0.7 0.5  PROT 7.9 6.0*  ALBUMIN 1.9* 1.5*   No results for input(s): LIPASE, AMYLASE in the last 72 hours. Cardiac Enzymes  No results for input(s): CKTOTAL, CKMB, CKMBINDEX, TROPONINI in the last 72 hours. BNP Invalid input(s): POCBNP D-Dimer No results for input(s): DDIMER in the last 72 hours. Hemoglobin A1C No results for input(s): HGBA1C in the last 72 hours. Fasting Lipid Panel  Recent Labs  12/15/15 0351  CHOL 52  HDL 10*  LDLCALC 30  TRIG 61  CHOLHDL 5.2   Thyroid Function Tests  Recent Labs  12/14/15 1707  TSH 0.469    Telemetry    Sinus rhythm switched back to atrial fibrillation/flutter at 12:45 am - Personally Reviewed  ECG    12/15/15: atrial flutter with variable AV conduction. Rate 151 bpm - Personally Reviewed  Radiology    Ct Abdomen Pelvis Wo Contrast  Result Date: 12/14/2015 CLINICAL DATA:  Abdominal distention and pain. Shortness of breath. Decreased appetite. EXAM: CT CHEST, ABDOMEN AND PELVIS WITHOUT CONTRAST TECHNIQUE: Multidetector CT imaging of the chest, abdomen and pelvis was performed following the standard  protocol without IV contrast. COMPARISON:  CT abdomen pelvis 12/01/2015 and CT chest 07/07/2013. FINDINGS: CT CHEST FINDINGS Cardiovascular: Atherosclerotic calcification of the arterial vasculature, including three-vessel involvement of the coronary arteries. Heart size normal. There is pericardial calcification, indicative of prior pericarditis. No pericardial effusion. Mediastinum/Nodes: No pathologically enlarged mediastinal or axillary lymph nodes. Hilar regions are difficult to definitively evaluate without IV contrast. Esophagus is grossly unremarkable. Lungs/Pleura: Mild centrilobular emphysema. Rounded masslike consolidation in the medial aspect of the superior segment right lower lobe measures approximately 4.7 x 4.9 cm, is new from 07/07/2013, has internal ossific/calcific debris and obscures the extrapleural fat plane. Soft tissue extends around the adjacent vertebral body into the left extrapleural space. Trace right pleural effusion. Lungs are otherwise clear. Musculoskeletal: Lucency and destruction of the T7 vertebral body is seen with surrounding soft tissue. There is lysis within the anterior aspect of the T6 vertebral body as well. Airway is unremarkable. CT ABDOMEN PELVIS FINDINGS Hepatobiliary: Liver and gallbladder are unremarkable. No biliary ductal dilatation. Pancreas: Negative. Spleen: Negative. Adrenals/Urinary Tract: Adrenal glands are unremarkable. Inferior aspects of both kidneys are obscured by streak artifact from spinal hardware. Low-attenuation lesions in the left kidney measure up to 2.0 cm and are difficult to further characterize without post-contrast imaging. Ureters are decompressed. There are scattered bladder diverticulum. Stomach/Bowel: Stomach, small bowel, appendix and colon are unremarkable. Vascular/Lymphatic: Atherosclerotic calcification of the arterial vasculature without abdominal aortic aneurysm. Postoperative changes are seen at the aortic bifurcation. No  pathologically enlarged lymph nodes. Reproductive: Prostate is slightly enlarged and calcified. Other: No free fluid.  Mesenteries and peritoneum are unremarkable. Musculoskeletal: Postoperative changes in the lumbar spine. No additional worrisome lytic or sclerotic lesions. Bone graft harvest sites in the iliac wings. IMPRESSION: 1. Masslike consolidation in the right lower lobe with invasion of the extrapleural fat, worrisome for primary bronchogenic carcinoma. Lymphoma and sarcoma are considered less likely. 2. Lytic destruction and compression of the T7 vertebral body with surrounding soft tissue, consistent with a pathologic fracture. Minimal involvement of the T6 vertebral body. 3. Trace right pleural effusion. 4. No evidence of primary malignancy or metastatic disease in the abdomen or pelvis. 5.  Aortic atherosclerosis (ICD10-170.0). Electronically Signed   By: Lorin Picket M.D.   On: 12/14/2015 14:21   Ct Head Wo Contrast  Result Date: 12/14/2015 CLINICAL DATA:  Generalized weakness, headache and photosensitivity. Decreased appetite. Fall yesterday with loss of sensation in right leg, initial encounter. EXAM: CT HEAD WITHOUT CONTRAST TECHNIQUE: Contiguous axial images were obtained from the base  of the skull through the vertex without intravenous contrast. COMPARISON:  None. FINDINGS: Brain: No evidence of an acute infarct, acute hemorrhage, mass lesion, mass effect or hydrocephalus. Small area of encephalomalacia in the left occipital lobe, stable. Atrophy. Periventricular low attenuation. Vascular: No hyperdense vessel or unexpected calcification. Skull: Normal. Negative for fracture or focal lesion. Sinuses/Orbits: No acute finding. Other: None. IMPRESSION: 1. No acute intracranial abnormality. 2. Atrophy and chronic microvascular white matter ischemic changes. 3. Small area of encephalomalacia in the left occipital lobe. Electronically Signed   By: Lorin Picket M.D.   On: 12/14/2015 10:31    Ct Chest Wo Contrast  Result Date: 12/14/2015 CLINICAL DATA:  Abdominal distention and pain. Shortness of breath. Decreased appetite. EXAM: CT CHEST, ABDOMEN AND PELVIS WITHOUT CONTRAST TECHNIQUE: Multidetector CT imaging of the chest, abdomen and pelvis was performed following the standard protocol without IV contrast. COMPARISON:  CT abdomen pelvis 12/01/2015 and CT chest 07/07/2013. FINDINGS: CT CHEST FINDINGS Cardiovascular: Atherosclerotic calcification of the arterial vasculature, including three-vessel involvement of the coronary arteries. Heart size normal. There is pericardial calcification, indicative of prior pericarditis. No pericardial effusion. Mediastinum/Nodes: No pathologically enlarged mediastinal or axillary lymph nodes. Hilar regions are difficult to definitively evaluate without IV contrast. Esophagus is grossly unremarkable. Lungs/Pleura: Mild centrilobular emphysema. Rounded masslike consolidation in the medial aspect of the superior segment right lower lobe measures approximately 4.7 x 4.9 cm, is new from 07/07/2013, has internal ossific/calcific debris and obscures the extrapleural fat plane. Soft tissue extends around the adjacent vertebral body into the left extrapleural space. Trace right pleural effusion. Lungs are otherwise clear. Musculoskeletal: Lucency and destruction of the T7 vertebral body is seen with surrounding soft tissue. There is lysis within the anterior aspect of the T6 vertebral body as well. Airway is unremarkable. CT ABDOMEN PELVIS FINDINGS Hepatobiliary: Liver and gallbladder are unremarkable. No biliary ductal dilatation. Pancreas: Negative. Spleen: Negative. Adrenals/Urinary Tract: Adrenal glands are unremarkable. Inferior aspects of both kidneys are obscured by streak artifact from spinal hardware. Low-attenuation lesions in the left kidney measure up to 2.0 cm and are difficult to further characterize without post-contrast imaging. Ureters are decompressed.  There are scattered bladder diverticulum. Stomach/Bowel: Stomach, small bowel, appendix and colon are unremarkable. Vascular/Lymphatic: Atherosclerotic calcification of the arterial vasculature without abdominal aortic aneurysm. Postoperative changes are seen at the aortic bifurcation. No pathologically enlarged lymph nodes. Reproductive: Prostate is slightly enlarged and calcified. Other: No free fluid.  Mesenteries and peritoneum are unremarkable. Musculoskeletal: Postoperative changes in the lumbar spine. No additional worrisome lytic or sclerotic lesions. Bone graft harvest sites in the iliac wings. IMPRESSION: 1. Masslike consolidation in the right lower lobe with invasion of the extrapleural fat, worrisome for primary bronchogenic carcinoma. Lymphoma and sarcoma are considered less likely. 2. Lytic destruction and compression of the T7 vertebral body with surrounding soft tissue, consistent with a pathologic fracture. Minimal involvement of the T6 vertebral body. 3. Trace right pleural effusion. 4. No evidence of primary malignancy or metastatic disease in the abdomen or pelvis. 5.  Aortic atherosclerosis (ICD10-170.0). Electronically Signed   By: Lorin Picket M.D.   On: 12/14/2015 14:21   Dg Chest Port 1 View  Result Date: 12/14/2015 CLINICAL DATA:  75 year old male with a history of chest pain EXAM: PORTABLE CHEST 1 VIEW COMPARISON:  11/28/2015 FINDINGS: Double density in the right hilum, new from the comparison. No pleural effusion or pneumothorax. No confluent airspace disease. No displaced fracture. IMPRESSION: Double density in the right hilum, new from the  comparison plain film in September. Given the rapid development, this may reflect a developing infection in the medial lung, however, contrast-enhanced CT is recommended to rule out adenopathy or mass. Signed, Dulcy Fanny. Earleen Newport, DO [facility] Radiology Electronically Signed   By: Corrie Mckusick  D.O.   On: 12/14/2015 09:28    Cardiac Studies   Echo 06/02/14: Study Conclusions  - Procedure narrative: Transthoracic echocardiography. Image quality was poor. The study was technically difficult, as a result of poor acoustic windows. - Left ventricle: The cavity size was normal. There was mild concentric hypertrophy. Systolic function was normal. The estimated ejection fraction was in the range of 60% to 65%. Images were inadequate for LV wall motion assessment. Doppler parameters are consistent with abnormal left ventricular relaxation (grade 1 diastolic dysfunction). - Mitral valve: Mildly calcified annulus. Normal thickness leaflets . - Left atrium: The atrium was mildly dilated. - Right atrium: The atrium was mildly dilated.  Patient Profile     Mr. Seright is  A 4M with hypertension, diabetes, TIA on clopidogrel, s/p CEA, and GERD here with several days of worsening malaise/fatigue and a recent fall who then developed Afib with RVR in the ED. He subsequently converted to NSR following uptitration of a diltiazem gtt and additional bolus dosing of metoprolol but is now back in atrial flutter.  Hospitalization also complicated by closed fracture of 7 thoracic vertebrae, R lower lobe lung mass, urinary retention and [name].    Assessment & Plan    # Atrial fibrillation with RVR: Mr. Topf is back in atrial flutter this AM.  Rates are poorly-controlled.  He is not on anticoagulation, as he is going for a lung biopsy today.  He doesn't have much blood pressure room to continue increasing nodal agents.  Currently on a diltiazem drip at 10.  We will start digoxin 0.5 mg IV followed by 0.25mg  IV q8h x2 doses.  Echo pending.  Thyroid is within normal limits.  Will supplement potassium.   # Carotid stenosis s/p CEA:  Carotid Dopplers pending. LDL is 30 so no statin.  # Prior TIA: Holding home plavix for biopsy.  Signed, Skeet Latch, MD  12/16/2015, 8:16  AM

## 2015-12-16 NOTE — Progress Notes (Signed)
Nutrition Brief Note  Patient identified on the Malnutrition Screening Tool (MST) Report. No weight loss noted per review of usual weights below.   Wt Readings from Last 15 Encounters:  12/16/15 205 lb 12.8 oz (93.4 kg)  12/01/15 206 lb (93.4 kg)  11/12/15 206 lb (93.4 kg)  09/18/15 202 lb (91.6 kg)  07/08/15 202 lb 14.4 oz (92 kg)  06/05/15 202 lb (91.6 kg)  05/30/15 203 lb 9.6 oz (92.4 kg)  04/10/15 203 lb 12.8 oz (92.4 kg)  03/20/15 202 lb 9.6 oz (91.9 kg)  01/01/15 200 lb (90.7 kg)  11/07/14 195 lb 6.4 oz (88.6 kg)  10/10/14 200 lb (90.7 kg)  08/20/14 194 lb 12.8 oz (88.4 kg)  06/26/14 197 lb (89.4 kg)  06/13/14 203 lb 6.4 oz (92.3 kg)    Body mass index is 31.76 kg/m. Patient meets criteria for class 1 obesity based on current BMI.   Current diet order is heart healthy, patient is consuming approximately 50% of meals at this time. Labs and medications reviewed.   No nutrition interventions warranted at this time. If nutrition issues arise, please consult RD.   Molli Barrows, RD, LDN, [place] Pager [phone] After Hours Pager [phone]

## 2015-12-16 NOTE — Progress Notes (Signed)
I have reviewed the MRI scan of the thoracic spine. 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 Although there is some stenosis and compression there is no focal lesion for which I think any surgical intervention will improve his situation.  At this point I believe the patient has a fixed deficit of his spinal cord which will not recover despite any additional interventions. It is important to further characterize this process by means of a percutaneous biopsy done by interventional radiology and continue treating with antibiotics and probable external beam radiation. I wish there was more I could do in this situation.

## 2015-12-16 NOTE — Consult Note (Signed)
Radiation Oncology         [phone] ________________________________  Initial inpatient Consultation  Name: Marcus [name] MRN: [id number]  Date: 12/16/15 DOB: 1940-07-24  [id number] Marcus Cowden, MD  No ref. provider found   REFERRING PHYSICIAN: No ref. provider found  DIAGNOSIS: The primary encounter diagnosis was Community acquired pneumonia of left lung, unspecified part of lung. Diagnoses of Right lower lobe lung mass, Closed fracture of seventh thoracic vertebra, unspecified fracture morphology, initial encounter [facility]), Urinary retention, Atrial fibrillation with RVR ([place]), and Bacteremia were also pertinent to this visit.    ICD-9-CM ICD-10-CM   1. Community acquired pneumonia of left lung, unspecified part of lung 486 J18.9   2. Right lower lobe lung mass 786.6 R91.8 CANCELED: CT guided needle placement     CANCELED: CT guided needle placement  3. Closed fracture of seventh thoracic vertebra, unspecified fracture morphology, initial encounter ([place]) 805.2 S22.069A   4. Urinary retention 788.20 R33.9   5. Atrial fibrillation with RVR ([facility]) 427.31 I48.91   6. Bacteremia 790.7 R78.81     HISTORY OF PRESENT ILLNESS: Marcus [name] is a 75 y.o. male seen at the request of Dr. Alfredia [name] for a newly noted T7 pathologic fracture with concerns for tumor along the region of T6-T7. Marcus [name] has been a relatively healthy man until recently. He has noticed progressive fatigue and poor stamina in the last two months. He also developed 2 UTIs treated by PCP. In August he was treated for zoster on two occasions as well. He reports he was given cymbalta and prednisone taper for a burning pain he felt over his left chest wall. He did not notice improvement of pain, and last Thursday fell at home while trying to get dressed. He tried to crawl to get his phone to call his wife, but after about 2 hours, he couldn't get up and when his wife arrived home she helped him up. He was walking the remainder of the  day and the following. On Saturday 12/14/15, the patient could not stand and presented to the ED. Of note he did have urinary retention requiring urology to place a catheter. Of note the family reports he was able to wiggle his toes and raise his legs until yesterday (Sunday). He underwent CT of the chest, abdomen, pelvis without contrast as he has an IVP dye intolerance (nausea). These images revealed mass like consolidation of the right lower lobe with invasion of the extrapleural fat, and a lytic lesion with compression of the T7 vertebral body and surrounding soft tissue with minimal involvement of T6. Trace right pleural effusion was noted. No findings concerning for disease were noted in the abdomen or pelvis. He has since been started on Dexamethasone 10 mg QID today.  He spiked a fever during his work up and was felt to have a pneumonia with a WBC on presentation of 15.9 (while on prednisone). He received Azythromycin and Ceftriaxone, and when blood cultures revealed staphylococcus aureus he was switched to Cefazolin. He has had a nice response with his WBC which was 6.5 this morning. Due to the infectious processes IR would like to hold off on biopsy of the lung, but is considering biopsy of T7 soft tissue. He also has been given permission by radiology to proceed with MRI of the thoracic spine. We have been asked to see the patient to discuss radiotherapy to the spine.  PREVIOUS RADIATION THERAPY: No  PAST MEDICAL HISTORY:  Past Medical History:  Diagnosis Date  .  Anxiety state, unspecified   . Calculus of kidney   . 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 Complication of anesthesia    " I woke up in the recovery room with tube in throat and panicked."  . 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 Herpes zoster without mention of complication   . History of hiatal hernia   . Hypertrophy of prostate with urinary obstruction and other lower urinary tract symptoms (LUTS)   . Insomnia,  unspecified   . Lumbago   . Other and unspecified hyperlipidemia   . Pneumonia    bronchial  . Reflux esophagitis   . Seasonal allergies   . Stroke ([place])   . TIA (transient ischemic attack)   . Type II or unspecified type diabetes mellitus without mention of complication, not stated as uncontrolled   . Unspecified essential hypertension   . Unspecified vitamin D deficiency   . Urethral stricture unspecified   . Urinary frequency       PAST SURGICAL HISTORY: Past Surgical History:  Procedure Laterality Date  . CARDIAC CATHETERIZATION  1986   normal, Dr Lia Foyer  . ENDARTERECTOMY Left 06/08/2014   Procedure: LEFT CAROTID ARTERY ENDARTERECTOMY;  Surgeon: Rosetta Posner, MD;  Location: [facility];  Service: Vascular;  Laterality: Left;  . EYE SURGERY  2014   cataract  . EYE SURGERY  2013   Retina  . PATCH ANGIOPLASTY Left 06/08/2014   Procedure: WITH DACRON PATCH ANGIOPLASTY;  Surgeon: Rosetta Posner, MD;  Location: [facility];  Service: Vascular;  Laterality: Left;  . SPINAL FUSION  12/91,11/91   obtained bone from left lower leg  . stimulator 1991     spine  . stress thallium  1994    normal;  . stretching bladder  07/2007   Dr Risa Grill    FAMILY HISTORY:  Family History  Problem Relation Age of Onset  . Diabetes Mother   . Cancer Mother   . Hypertension Father   . Stroke Father   . Alcohol abuse Brother     SOCIAL HISTORY:  Social History   Social History  . Marital status: Married    Spouse name: N/A  . Number of children: N/A  . Years of education: N/A   Occupational History  . Not on file.   Social History Main Topics  . Smoking status: Former Smoker    Types: Cigarettes    Quit date: 01/01/1991  . Smokeless tobacco: Never Used     Comment: " Quit around 1986"  . Alcohol use No  . Drug use: No  . Sexual activity: Yes    Birth control/ protection: None   Other Topics Concern  . Not on file   Social History Narrative  . No narrative on file    ALLERGIES:  Codeine; Demerol [meperidine]; Contrast media [iodinated diagnostic agents]; Gabapentin; and Other  MEDICATIONS:  Current Facility-Administered Medications  Medication Dose Route Frequency Provider Last Rate Last Dose  . 0.9 %  sodium chloride infusion   Intravenous Continuous Cleves, DO 100 mL/hr at 12/16/15 1234    . acetaminophen (TYLENOL) tablet 1,000 mg  1,000 mg Oral Q6H PRN Bertram Savin Sheikh, DO      . acetaminophen (TYLENOL) tablet 650 mg  650 mg Oral Q4H PRN Radene Gunning, NP   650 mg at 12/16/15 0814  . ceFAZolin (ANCEF) IVPB 2g/100 mL premix  2 g Intravenous Q8H Carlyle Basques, MD   2 g at  12/16/15 [id number]  . dexamethasone (DECADRON) injection 10 mg  10 mg Intravenous Q6H Omair Latif Sheikh, DO   10 mg at 12/16/15 1235  . digoxin (LANOXIN) 0.25 MG/ML injection 0.25 mg  0.25 mg Intravenous Once Skeet Latch, MD      . Derrill Memo ON 12/17/2015] digoxin (LANOXIN) 0.25 MG/ML injection 0.25 mg  0.25 mg Intravenous Once Skeet Latch, MD      . diltiazem (CARDIZEM) 100 mg in dextrose 5% 161mL (1 mg/mL) infusion  5-15 mg/hr Intravenous Titrated Herminio Commons, MD 5 mL/hr at 12/16/15 0815 5 mg/hr at 12/16/15 0815  . guaiFENesin-dextromethorphan (ROBITUSSIN DM) 100-10 MG/5ML syrup 5 mL  5 mL Oral Q4H PRN Bertram Savin Sheikh, DO   5 mL at 12/16/15 0319  . HYDROcodone-acetaminophen (NORCO) 10-325 MG per tablet 1 tablet  1 tablet Oral Q4H PRN Kerney Elbe, DO   1 tablet at 12/16/15 1238  . LORazepam (ATIVAN) tablet 0.5 mg  0.5 mg Oral Daily Jake Church Masters, RPH   0.5 mg at 12/16/15 0815  . LORazepam (ATIVAN) tablet 0.5 mg  0.5 mg Oral BID PRN Jake Church Masters, RPH   0.5 mg at 12/15/15 2217  . morphine 2 MG/ML injection 2 mg  2 mg Intravenous Q4H PRN Omair Latif Sheikh, DO      . ondansetron [facility]) injection 4 mg  4 mg Intravenous Q6H PRN Lezlie Octave Black, NP      . timolol (TIMOPTIC) 0.5 % ophthalmic solution 1 drop  1 drop Both Eyes BID Kerney Elbe, DO   1 drop at  12/16/15 1006    REVIEW OF SYSTEMS:  On review of systems, the patient reports that he is doing okay but is having a difficult time with coping with the fact that he cannot move his legs. He describes a burning pain from the nipple line down and profound leg weakness. He denies any chest pain, shortness of breath at rest, and his last  Fever was on 12/14/15. He continues to cough but has a hard time expelling any mucous. He reports nochills, night sweats, but has maintained his weight but noted his clothes fitting much looser as of late. He denies any bowel or bladder disturbances, and denies abdominal pain, nausea or vomiting. He denies any new musculoskeletal or joint aches or pains. A complete review of systems is obtained and is otherwise negative.    PHYSICAL EXAM:  height is 5' 7.5" (1.715 m) and weight is 205 lb 12.8 oz (93.4 kg). His oral temperature is 98.2 F (36.8 C). His blood pressure is 107/81 and his pulse is 98. His respiration is 20 and oxygen saturation is 96%.   Pain Scale 0/10 In general this is a well appearing caucasian male in no acute distress. He is alert and oriented x4 and appropriate throughout the examination. HEENT reveals that the patient is normocephalic, atraumatic. EOMs are intact. PERRLA. Skin is intact without any evidence of gross lesions. Cardiovascular exam reveals a regular rate and rhythm, no clicks rubs or murmurs are auscultated. Chest is clear to auscultation bilaterally. Lymphatic assessment is performed and does not reveal any adenopathy in the cervical, supraclavicular, axillary, or inguinal chains. Abdomen has active bowel sounds in all quadrants and is intact. The abdomen is soft, non tender, non distended. Lower extremities are negative for pretibial pitting edema, deep calf tenderness, cyanosis or clubbing. Cranial nerves II-XII are intact and he has intact response to soft touch of his face, upper extremities, and of his  anterior left thigh. He has intact  5/5 strength of the upper extremities. Distal to the nipple line on the right he has no sensory or reproducible motor function, and distal to the left anterior thigh, he does not have sensory or reproducible motor function.   KPS = 30  100 - Normal; no complaints; no evidence of disease. 90   - Able to carry on normal activity; minor signs or symptoms of disease. 80   - Normal activity with effort; some signs or symptoms of disease. 22   - Cares for self; unable to carry on normal activity or to do active work. 60   - Requires occasional assistance, but is able to care for most of his personal needs. 50   - Requires considerable assistance and frequent medical care. 41   - Disabled; requires special care and assistance. 26   - Severely disabled; hospital admission is indicated although death not imminent. 59   - Very sick; hospital admission necessary; active supportive treatment necessary. 10   - Moribund; fatal processes progressing rapidly. 0     - Dead  Karnofsky DA, Abelmann [place], Craver LS and Burchenal [facility] [phone]) The use of the nitrogen mustards in the palliative treatment of carcinoma: with particular reference to bronchogenic carcinoma Cancer 1 634-56  LABORATORY DATA:  Lab Results  Component Value Date   WBC 11.1 (H) 12/16/2015   HGB 10.6 (L) 12/16/2015   HCT 33.0 (L) 12/16/2015   MCV 93.5 12/16/2015   PLT 286 12/16/2015   Lab Results  Component Value Date   NA 134 (L) 12/16/2015   K 3.5 12/16/2015   CL 99 (L) 12/16/2015   CO2 27 12/16/2015   Lab Results  Component Value Date   ALT 45 12/16/2015   AST 87 (H) 12/16/2015   ALKPHOS 146 (H) 12/16/2015   BILITOT 0.5 12/16/2015     RADIOGRAPHY: Ct Abdomen Pelvis Wo Contrast  Result Date: 12/14/2015 CLINICAL DATA:  Abdominal distention and pain. Shortness of breath. Decreased appetite. EXAM: CT CHEST, ABDOMEN AND PELVIS WITHOUT CONTRAST TECHNIQUE: Multidetector CT imaging of the chest, abdomen and pelvis was performed  following the standard protocol without IV contrast. COMPARISON:  CT abdomen pelvis 12/01/2015 and CT chest 07/07/2013. FINDINGS: CT CHEST FINDINGS Cardiovascular: Atherosclerotic calcification of the arterial vasculature, including three-vessel involvement of the coronary arteries. Heart size normal. There is pericardial calcification, indicative of prior pericarditis. No pericardial effusion. Mediastinum/Nodes: No pathologically enlarged mediastinal or axillary lymph nodes. Hilar regions are difficult to definitively evaluate without IV contrast. Esophagus is grossly unremarkable. Lungs/Pleura: Mild centrilobular emphysema. Rounded masslike consolidation in the medial aspect of the superior segment right lower lobe measures approximately 4.7 x 4.9 cm, is new from 07/07/2013, has internal ossific/calcific debris and obscures the extrapleural fat plane. Soft tissue extends around the adjacent vertebral body into the left extrapleural space. Trace right pleural effusion. Lungs are otherwise clear. Musculoskeletal: Lucency and destruction of the T7 vertebral body is seen with surrounding soft tissue. There is lysis within the anterior aspect of the T6 vertebral body as well. Airway is unremarkable. CT ABDOMEN PELVIS FINDINGS Hepatobiliary: Liver and gallbladder are unremarkable. No biliary ductal dilatation. Pancreas: Negative. Spleen: Negative. Adrenals/Urinary Tract: Adrenal glands are unremarkable. Inferior aspects of both kidneys are obscured by streak artifact from spinal hardware. Low-attenuation lesions in the left kidney measure up to 2.0 cm and are difficult to further characterize without post-contrast imaging. Ureters are decompressed. There are scattered bladder diverticulum. Stomach/Bowel: Stomach, small bowel, appendix  and colon are unremarkable. Vascular/Lymphatic: Atherosclerotic calcification of the arterial vasculature without abdominal aortic aneurysm. Postoperative changes are seen at the aortic  bifurcation. No pathologically enlarged lymph nodes. Reproductive: Prostate is slightly enlarged and calcified. Other: No free fluid.  Mesenteries and peritoneum are unremarkable. Musculoskeletal: Postoperative changes in the lumbar spine. No additional worrisome lytic or sclerotic lesions. Bone graft harvest sites in the iliac wings. IMPRESSION: 1. Masslike consolidation in the right lower lobe with invasion of the extrapleural fat, worrisome for primary bronchogenic carcinoma. Lymphoma and sarcoma are considered less likely. 2. Lytic destruction and compression of the T7 vertebral body with surrounding soft tissue, consistent with a pathologic fracture. Minimal involvement of the T6 vertebral body. 3. Trace right pleural effusion. 4. No evidence of primary malignancy or metastatic disease in the abdomen or pelvis. 5.  Aortic atherosclerosis (ICD10-170.0). Electronically Signed   By: Lorin Picket M.D.   On: 12/14/2015 14:21   Dg Ribs Unilateral W/chest Left  Result Date: 11/28/2015 CLINICAL DATA:  Posterior thoracic tenderness over the ribs. Evaluate for fracture. EXAM: LEFT RIBS AND CHEST - 3+ VIEW COMPARISON:  11/12/2015 FINDINGS: No fracture or other bone lesions are seen involving the ribs. There is no evidence of pneumothorax or pleural effusion. Both lungs are clear. Heart size and mediastinal contours are within normal limits. IMPRESSION: Negative. Electronically Signed   By: Monte Fantasia M.D.   On: 11/28/2015 16:09   Ct Head Wo Contrast  Result Date: 12/14/2015 CLINICAL DATA:  Generalized weakness, headache and photosensitivity. Decreased appetite. Fall yesterday with loss of sensation in right leg, initial encounter. EXAM: CT HEAD WITHOUT CONTRAST TECHNIQUE: Contiguous axial images were obtained from the base of the skull through the vertex without intravenous contrast. COMPARISON:  None. FINDINGS: Brain: No evidence of an acute infarct, acute hemorrhage, mass lesion, mass effect or  hydrocephalus. Small area of encephalomalacia in the left occipital lobe, stable. Atrophy. Periventricular low attenuation. Vascular: No hyperdense vessel or unexpected calcification. Skull: Normal. Negative for fracture or focal lesion. Sinuses/Orbits: No acute finding. Other: None. IMPRESSION: 1. No acute intracranial abnormality. 2. Atrophy and chronic microvascular white matter ischemic changes. 3. Small area of encephalomalacia in the left occipital lobe. Electronically Signed   By: Lorin Picket M.D.   On: 12/14/2015 10:31   Ct Chest Wo Contrast  Result Date: 12/14/2015 CLINICAL DATA:  Abdominal distention and pain. Shortness of breath. Decreased appetite. EXAM: CT CHEST, ABDOMEN AND PELVIS WITHOUT CONTRAST TECHNIQUE: Multidetector CT imaging of the chest, abdomen and pelvis was performed following the standard protocol without IV contrast. COMPARISON:  CT abdomen pelvis 12/01/2015 and CT chest 07/07/2013. FINDINGS: CT CHEST FINDINGS Cardiovascular: Atherosclerotic calcification of the arterial vasculature, including three-vessel involvement of the coronary arteries. Heart size normal. There is pericardial calcification, indicative of prior pericarditis. No pericardial effusion. Mediastinum/Nodes: No pathologically enlarged mediastinal or axillary lymph nodes. Hilar regions are difficult to definitively evaluate without IV contrast. Esophagus is grossly unremarkable. Lungs/Pleura: Mild centrilobular emphysema. Rounded masslike consolidation in the medial aspect of the superior segment right lower lobe measures approximately 4.7 x 4.9 cm, is new from 07/07/2013, has internal ossific/calcific debris and obscures the extrapleural fat plane. Soft tissue extends around the adjacent vertebral body into the left extrapleural space. Trace right pleural effusion. Lungs are otherwise clear. Musculoskeletal: Lucency and destruction of the T7 vertebral body is seen with surrounding soft tissue. There is lysis  within the anterior aspect of the T6 vertebral body as well. Airway is unremarkable. CT ABDOMEN PELVIS  FINDINGS Hepatobiliary: Liver and gallbladder are unremarkable. No biliary ductal dilatation. Pancreas: Negative. Spleen: Negative. Adrenals/Urinary Tract: Adrenal glands are unremarkable. Inferior aspects of both kidneys are obscured by streak artifact from spinal hardware. Low-attenuation lesions in the left kidney measure up to 2.0 cm and are difficult to further characterize without post-contrast imaging. Ureters are decompressed. There are scattered bladder diverticulum. Stomach/Bowel: Stomach, small bowel, appendix and colon are unremarkable. Vascular/Lymphatic: Atherosclerotic calcification of the arterial vasculature without abdominal aortic aneurysm. Postoperative changes are seen at the aortic bifurcation. No pathologically enlarged lymph nodes. Reproductive: Prostate is slightly enlarged and calcified. Other: No free fluid.  Mesenteries and peritoneum are unremarkable. Musculoskeletal: Postoperative changes in the lumbar spine. No additional worrisome lytic or sclerotic lesions. Bone graft harvest sites in the iliac wings. IMPRESSION: 1. Masslike consolidation in the right lower lobe with invasion of the extrapleural fat, worrisome for primary bronchogenic carcinoma. Lymphoma and sarcoma are considered less likely. 2. Lytic destruction and compression of the T7 vertebral body with surrounding soft tissue, consistent with a pathologic fracture. Minimal involvement of the T6 vertebral body. 3. Trace right pleural effusion. 4. No evidence of primary malignancy or metastatic disease in the abdomen or pelvis. 5.  Aortic atherosclerosis (ICD10-170.0). Electronically Signed   By: Lorin Picket M.D.   On: 12/14/2015 14:21   Dg Chest Port 1 View  Result Date: 12/14/2015 CLINICAL DATA:  75 year old male with a history of chest pain EXAM: PORTABLE CHEST 1 VIEW COMPARISON:  11/28/2015 FINDINGS: Double density  in the right hilum, new from the comparison. No pleural effusion or pneumothorax. No confluent airspace disease. No displaced fracture. IMPRESSION: Double density in the right hilum, new from the comparison plain film in September. Given the rapid development, this may reflect a developing infection in the medial lung, however, contrast-enhanced CT is recommended to rule out adenopathy or mass. Signed, Dulcy Fanny. Earleen Newport, DO [facility] Electronically Signed   By: Corrie Mckusick D.O.   On: 12/14/2015 09:28   Ct Renal Stone Study  Result Date: 12/02/2015 CLINICAL DATA:  Swelling to both legs and the abdomen. Pain in the back radiating to the left flank and lower abdomen. History of urinary tract infection. Hematuria. EXAM: CT ABDOMEN AND PELVIS WITHOUT CONTRAST TECHNIQUE: Multidetector CT imaging of the abdomen and pelvis was performed following the standard protocol without IV contrast. COMPARISON:  11/19/2015 FINDINGS: Lower chest: Calcified granuloma in the right lung base. Mild pericardial calcification. Small esophageal hiatal hernia. Hepatobiliary: Noncontrast appearance is unremarkable. Pancreas: Noncontrast appearance is unremarkable. Spleen: Noncontrast appearance is unremarkable. Adrenals/Urinary Tract: No adrenal gland nodules. Scarring or atrophy of the kidneys, more prominent on the right. Small cyst in the anterior left kidney. Stones in the lower pole right kidney. No hydronephrosis or hydroureter. No ureteral stones or bladder stones. No bladder wall thickening. Stomach/Bowel: Stomach and small bowel are decompressed. Scattered stool and gas in the colon without abnormal distention. Scattered colonic diverticula. No evidence of diverticulitis. Appendix is normal. Vascular/Lymphatic: Aortic atherosclerosis. No enlarged abdominal or pelvic lymph nodes. Reproductive: Prostate gland is enlarged, measuring 5.1 cm diameter. Prostate calcifications.  Other: No abdominal wall hernia or abnormality. No abdominopelvic ascites. Musculoskeletal: Degenerative changes in the spine and hips. Postoperative changes with posterior fixation from L4 to the sacrum. Persistent moderate anterior subluxation of L5 on the sacrum. No change in the appearance of the spine since previous study. IMPRESSION: Nonobstructing stone in the right kidney. No hydronephrosis or hydroureter. Prostate gland is enlarged.  No evidence of bowel obstruction or inflammation. Electronically Signed   By: Lucienne Capers M.D.   On: 12/02/2015 00:56      IMPRESSION/PLAN: 1. Soft tissue density in the right lung with adjacent T6-7 defect including pathologic appearing fracture of T7 with symptoms concerning for cord compression. The patient is demonstrating examination findings and symptoms of cord compression. We agree with dexamethasone administration, and MRI. We will follow up with these results. Dr. Trenton Gammon has also been consulted to evaluate the patient and we will follow with him. Dr. [name] has reviewed the patient's CT scans to date and agrees with proceeding with radiotherapy, though we would very much like to have a diagnosis confirmed prior to proceeding.  2. Community acquired pneumonia and staph aureus bactermia. We will follow along with the primary team. 3. A fib with RVR. The patient continues on diltiazem per cardiology.       Carola Rhine, PAC

## 2015-12-16 NOTE — Progress Notes (Signed)
Request received for lung biopsy.  Imaging was reviewed with [name].  We will NOT plan to proceed with a lung biopsy this admission, especially in the setting of PNA.  It is possible this area in the lung is infectious and not malignant.  MRI pending for review of the area in the lung that is involving the spine.  We would recommend full treatment of abx therapy, eval with MRI, and possible follow up imaging to determine if this lesion is still present.  If so at that time, then we could possibly consider biopsy.  Please contact [name] if any questions arise regarding this situation [phone]  Field Staniszewski E 9:18 AM 12/16/2015

## 2015-12-16 NOTE — Progress Notes (Signed)
PROGRESS NOTE    Marcus [name]  [id number] DOB: 1940-12-07 DOA: 12/14/2015 PCP: Jeanmarie Hubert, MD  Brief Narrative:  75 y.o. male with medical history significant for urethral stricture, hypertension, diabetes, TIA, GERD, BPH, tremor, since to the emergency Department chief complaint of generalized weakness and recent fall. Initial evaluation revealed right lower lobe lung mass, closed fracture of 7 thoracic vertebrae, urinary retention, A. fib with RVR. He was worked up and found to have an [name]. Patient has been essentially bedbound and yesterday he had sensation in his legs and today he did not. Neurosurgery consulted and MRI of spine done. Unfortunately no Neurosurgical Intervention was done as Neurosurgeon believes he Infarcted spinal cord. Work up for [employer] concerning for Malignancy vs. Atypical Pneumonia with communication to the Spine causing a Discitis/Osteomyelitis. Other issues included paroxysmal Atrial Fibrillation with RVR which then converted to NSR.   Assessment & Plan:   Principal Problem:   Atrial fibrillation with rapid ventricular response (HCC) Active Problems:   Essential hypertension   Carotid stenosis   Diabetes (HCC)   HLD (hyperlipidemia)   CVA (cerebral vascular accident) ([place])   Fall   CAP (community acquired pneumonia)   Compression fracture of vertebra ([place])   Urinary retention   New onset a-fib ([place])   Right lower lobe lung mass   Lower extremity weakness   Bacteremia  Right Lower Lobe Consolidation with Invasion into the Vertebral Column Concerning for Bronchogenic Malignancy vs. Atypical Pneumonia with an associated Discitis-Osteomyelitis, poA -Unclear at this point if it is a Malignancy but favoring PNA with associated with Discitis/Osteomyeltits given [name] -MRI showed Peripherally enhancing collection along the dorsal aspect of the spinal canal is most consistent with epidural abscess, measuring up to 5 mm in  thickness. -RLL 4.7 x 5 cm mass like consolidation  -Family admits to Early Satiety, Weight Loss and Pain out of Proportion -C/w Cefazolin for Abx Coverage, DuoNebs q6h and q2hprn, Guaifenesin-Dextromethorphan po q4h And Flutter Valve. Added Hypertonic Saline Nebs. -Obtain Sputum Cx -IV Steroids with Dexamethasone -ID [name] Following -Unable to get Biopsy at this point because unclear if PNA or Malignancy -Repeat CXR in AM -Repeat CBC in AM  T7 Compression Fracture 2/2 to Direct Extension of Process in Right Lower Lobe  -CT Scan showed Lytic destruction and compression of the T7 vertebral body with surrounding soft tissue, consistent with a pathologic fracture. Minimal involvement of the T6 vertebral body. -MRI shows Abnormal signal and contrast enhancement of the T6 and T7 vertebral bodies with associated T7 compression fracture. This is likely secondary to direct extension of the process within the right lower lobe. While this may be extension of a pulmonary malignancy invading the vertebral column, an infectious process of the lung with an associated discitis-osteomyelitis is also a possibility. 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 -Neurosurgery [name] and recommended starting patient on Decadron 10 mg IV q6h -Was able to get MRI of Spine with Contrast after  long discussion with [name] in Interventional Radiology who discussed the case  with his colleague [name]. Because Patient has a Back Stimulator, MRI was not previously able to be done however risks outweighed benefits and proceeded with MRI.  -Neurosurgery evaluated the MRI and noted that profound high signal and swelling of the spinal cord consistent with spinal cord infarction. Although there was some stenosis and compression there was no focal lesion for which surgical intervention will improve his situation. And believes believe the patient has a fixed deficit of his spinal cord which will not recover despite any additional Interventions -PT/OT Eval and Treat -C/w Decadron 10 mg IV q6h  -Appreciate Neurosurgery Consult and Recc's  [name] from possible Osteomyelitis -WBC was 13.1 -> 11.1, repeat CBC in AM -Unclear Source however possible Discitis/Osteomyelitis of Spine -C/w Cefazolin per ID Recc's -Repeat Blood Cx's  Atrial Flutter/Atrial Fibrillation with RVR s/p Conversion to NSR -TSH normal -Cardizem gtt -Cardiology Evaluation and Recc's appreciated -Patient was given Digoxin IV x 3 -Was started on Apixaban and received one dose but will Hold off Anticoagulation for now in case patient undergoes Biopsy or Abscess Drainage in Spine -TTEchocardiogram pending -Will need Anticoagulation once it is known if procedures will be done or not  Diabetes Mellitus Type 2.  -Diet controlled. -Continue to Monitor  Hypotension -On Cardizem gtt -C/w NS at a rate of 100 mL/hr and bolus as appropriate -Continue to Monitor closely  Urinary retention with Hx of BPH and   -Patient with a history of urethral stricture.  -Foley placed by Urology -Strict I's and O's  History of CVA from [facility]  -Has some residual right-sided weakness. On Plavix. -Hold Plavix for now -Carotid Dopplers  DVT prophylaxis: Heparin sq Code Status: Full Family Communication: Discussed in Detail plan of care with Family who is in agreement with current plan. All questions  answered to their satisfaction Disposition Plan: SNF possibly  Consultants:   Cardiology  Urology  Interventional Radiology  Infectious Diseases  Neurosurgery  Radiation Oncology  Procedures: MRI  Antimicrobials: Cefazolin  Subjective: Seen early this AM and he was woken from his sleep. Stated SOB was ok but had a cough where he would swallow the sputum. Patient was concerned about his pain and wanted something for it as he felt as if was excruciating. No other complaints at this time and patient did not complain of Lower Extremity weakness to me.   Received a call from [name] in ID later in the morning and was concerned about patients  change in sensation in LE's along with weakness so Neurosurgery was immediately consulted and Decadron IV started. Neurosurgery looked over the case and felt as if RLL Consolidation was Malignancy that infiltrated the spine because of the pathological T7 Fracture so they recommended Radiation Oncology Consult. Radiation Oncology was consulted and they stated they would see the patient. In the meanwhile, the Interventional Radiologist [name] looked over the case and decided not to do a biopsy especially in the setting of possible PNA and felt as if the area in the lung could be infectious and not malignant. Because of this Patient was started on Apixaban by Cardiology via [place], and unfortunately he received a dose. Because patient was unable to get an MRI due to Stimulator, [name] was contacted and notified about concerns and state that they could not do the biopsy because of Apixaban being given and because PNA could not be ruled out. Unfortunately patient does not  tolerate Contrast, so Contrast CT Spine and Chest was unable to be done. [name] consulted with Neuroradiologist [name] who then advised that an MRI benefits outweighed the Risks. By this time Dr. Annette Stable the Neurosurgeon evaluated the patient so an MRI with Contrast SRS  protocol was obtained. Dr. Ulyses Jarred in Radiology paged me with the results and stated that a Pneumonia could not be ruled out. Dr. Annette Stable reviewed the images and felt as if the patient had infarcted his spinal cord because of the profound high signal and swelling. He discussed this with the family and advised them that he believed that the patient has a fixed deficit of his spinal cord which will not recover despite any additional interventions. I went back and talked with the patient and family and they understood the situation. I discussed the MRI findings of what the Radiologist Dr. Collins [place] felt it could be and family was optimistic that it could be a Pneumonia. I stated that it could still be a cancer but I was favoring Atypical Pneumonia with Discitis/Osteomyeltis given the [name], however could not rule out Malignancy at this point without a biopsy. Will discuss MRI findings with ID see if Antibiotics will be added/changed. Patient's family understood and appreciated the care they were receiving by all parties involved.   Objective: Vitals:   12/16/15 1500 12/16/15 1600 12/16/15 1834 12/16/15 1942  BP: 101/80 95/75 (!) 108/54   Pulse:   74   Resp: 19 14 16    Temp:      TempSrc:      SpO2:   94% 96%  Weight:      Height:        Intake/Output Summary (Last 24 hours) at 12/16/15 1959 Last data filed at 12/16/15 1817  Gross per 24 hour  Intake          1331.01 ml  Output             1700 ml  Net          -368.99 ml   Filed Weights   12/14/15 2303 12/15/15 0554 12/16/15 0545  Weight: 94.1 kg (207 lb 6.4 oz) 94.6 kg (208 lb 9.6 oz) 93.4 kg (205 lb 12.8 oz)    Examination: Physical Exam:  Constitutional: Chronic Ill appearing 75 year old male who appears uncompfortable Eyes: Lids and conjunctivae normal, sclerae anicteric  ENMT: External Ears, Nose appear normal. Grossly normal hearing.  Neck: Appears normal, supple, no cervical masses, normal ROM, no appreciable  thyromegaly, no JVD Respiratory: Diminished bilaterally, no wheezing, rales, rhonchi or crackles. Normal respiratory effort and patient is not tachypenic. No accessory muscle use.  Cardiovascular: RRR, no murmurs / rubs / gallops. S1 and S2 auscultated. No extremity edema. Abdomen: Soft, non-tender, non-distended. No masses palpated. No appreciable hepatosplenomegaly. Bowel sounds positive.  GU: Deferred. Musculoskeletal: No clubbing / cyanosis of digits/nails. No joint deformity upper and lower extremities. No contractures. Decreased strength and muscle tone in LE with 0/5 in Strength.   Skin: No rashes, lesions, ulcers. No induration; Warm and dry.  Neurologic: CN 2-12 grossly intact with no focal deficits. Sensation decreased especially from Right Leg to Right Nipple line and Left Leg up until Thigh, Romberg sign cerebellar reflexes not assessed.  Psychiatric: Normal judgment and insight. Alert and oriented x 3. Depressed mood and flat affect.   Data Reviewed: I have personally reviewed following labs and imaging studies  CBC:  Recent Labs Lab 12/14/15 0635 12/15/15 0351 12/16/15 0432  WBC 15.9* 13.1* 11.1*  NEUTROABS  --   --  9.4*  HGB 11.7* 10.5* 10.6*  HCT 35.7* 32.7* 33.0*  MCV 93.2 93.7 93.5  PLT 291 275 [id number]   Basic Metabolic Panel:  Recent Labs Lab 12/14/15 0635 12/15/15 0351 12/16/15 0432  NA 132* 132* 134*  K 3.6 3.6 3.5  CL 93* 96* 99*  CO2 29 28 27   GLUCOSE 124* 110* 99  BUN 12 16 22*  CREATININE 0.79 0.86 0.89  CALCIUM 8.6* 8.6* 8.5*  MG  --   --  2.1  PHOS  --   --  3.2   GFR: Estimated Creatinine Clearance: 82.5 mL/min (by C-G formula based on SCr of 0.89 mg/dL). Liver Function Tests:  Recent Labs Lab 12/14/15 0635 12/16/15 0432  AST 44* 87*  ALT 28 45  ALKPHOS 183* 146*  BILITOT 0.7 0.5  PROT 7.9 6.0*  ALBUMIN 1.9* 1.5*   No results for input(s): LIPASE, AMYLASE in the last 168 hours. No results for input(s): AMMONIA in the last 168  hours. Coagulation Profile:  Recent Labs Lab 12/14/15 1721  INR 1.04   Cardiac Enzymes: No results for input(s): CKTOTAL, CKMB, CKMBINDEX, TROPONINI in the last 168 hours. BNP (last 3 results) No results for input(s): PROBNP in the last 8760 hours. HbA1C: No results for input(s): HGBA1C in the last 72 hours. CBG:  Recent Labs Lab 12/14/15 0638  GLUCAP 138*   Lipid Profile:  Recent Labs  12/15/15 0351  CHOL 52  HDL 10*  LDLCALC 30  TRIG 61  CHOLHDL 5.2   Thyroid Function Tests:  Recent Labs  12/14/15 1707  TSH 0.469   Anemia Panel: No results for input(s): VITAMINB12, FOLATE, FERRITIN, TIBC, IRON, RETICCTPCT in the last 72 hours. Sepsis Labs:  Recent Labs Lab 12/14/15 0906 12/14/15 1151  LATICACIDVEN 1.07 1.54    Recent Results (from the past 240 hour(s))  Blood culture (routine x 2)     Status: Abnormal (Preliminary result)   Collection Time: 12/14/15  8:50 AM  Result Value Ref Range Status   Specimen Description BLOOD LEFT ARM  Final   Special Requests BOTTLES DRAWN AEROBIC AND ANAEROBIC 10CC  Final   Culture  Setup Time   Final    IN BOTH AEROBIC AND ANAEROBIC BOTTLES GRAM POSITIVE COCCI IN CLUSTERS CRITICAL RESULT CALLED TO, READ BACK BY AND VERIFIED WITH: TO TO SGULLETT(PHARD) BY MCAMPBELL 12/15/2015 AT 5:55AM    Culture (A)  Final    STAPHYLOCOCCUS AUREUS SUSCEPTIBILITIES TO FOLLOW    Report Status PENDING  Incomplete  Blood culture (routine x 2)     Status: Abnormal (Preliminary result)   Collection Time: 12/14/15  9:00 AM  Result Value Ref Range Status   Specimen Description BLOOD RIGHT HAND  Final   Special Requests BOTTLES DRAWN AEROBIC AND ANAEROBIC 5CC  Final   Culture  Setup Time   Final    IN BOTH AEROBIC AND ANAEROBIC BOTTLES GRAM POSITIVE COCCI IN CLUSTERS Organism ID to follow CRITICAL RESULT CALLED TO, READ BACK BY AND VERIFIED WITH: TO TO SGULLETT(PHARD) BY MCAMPBELL 12/15/2015 AT 5:55AM    Culture (A)  Final     STAPHYLOCOCCUS AUREUS SUSCEPTIBILITIES TO FOLLOW    Report Status PENDING  Incomplete  Blood Culture ID Panel (Reflexed)     Status: Abnormal   Collection Time: 12/14/15  9:00 AM  Result Value Ref Range Status   Enterococcus species NOT DETECTED NOT DETECTED Final   Vancomycin resistance NOT DETECTED NOT  DETECTED Final   Listeria monocytogenes NOT DETECTED NOT DETECTED Final   Staphylococcus species DETECTED (A) NOT DETECTED Final    Comment: CRITICAL RESULT CALLED TO, READ BACK BY AND VERIFIED WITH: TO SGULLETT(PHARD) BY MCAMPBELL 12/15/2015 AT 5:55AM    Staphylococcus aureus DETECTED (A) NOT DETECTED Final    Comment: CRITICAL RESULT CALLED TO, READ BACK BY AND VERIFIED WITH: TO SGULLETT(PHARD) BY MCAMPBELL 12/15/2015 AT 5:55AM    Methicillin resistance NOT DETECTED NOT DETECTED Final   Streptococcus species NOT DETECTED NOT DETECTED Final   Streptococcus agalactiae NOT DETECTED NOT DETECTED Final   Streptococcus pneumoniae NOT DETECTED NOT DETECTED Final   Streptococcus pyogenes NOT DETECTED NOT DETECTED Final   Acinetobacter baumannii NOT DETECTED NOT DETECTED Final   Enterobacteriaceae species NOT DETECTED NOT DETECTED Final   Enterobacter cloacae complex NOT DETECTED NOT DETECTED Final   Escherichia coli NOT DETECTED NOT DETECTED Final   Klebsiella oxytoca NOT DETECTED NOT DETECTED Final   Klebsiella pneumoniae NOT DETECTED NOT DETECTED Final   Proteus species NOT DETECTED NOT DETECTED Final   Serratia marcescens NOT DETECTED NOT DETECTED Final   Carbapenem resistance NOT DETECTED NOT DETECTED Final   Haemophilus influenzae NOT DETECTED NOT DETECTED Final   Neisseria meningitidis NOT DETECTED NOT DETECTED Final   Pseudomonas aeruginosa NOT DETECTED NOT DETECTED Final   Candida albicans NOT DETECTED NOT DETECTED Final   Candida glabrata NOT DETECTED NOT DETECTED Final   Candida krusei NOT DETECTED NOT DETECTED Final   Candida parapsilosis NOT DETECTED NOT DETECTED Final    Candida tropicalis NOT DETECTED NOT DETECTED Final  Urine culture     Status: Abnormal (Preliminary result)   Collection Time: 12/14/15  4:22 PM  Result Value Ref Range Status   Specimen Description URINE, RANDOM  Final   Special Requests NONE  Final   Culture 20,000 COLONIES/mL STAPHYLOCOCCUS AUREUS (A)  Final   Report Status PENDING  Incomplete  MRSA PCR Screening     Status: None   Collection Time: 12/14/15 11:57 PM  Result Value Ref Range Status   MRSA by PCR NEGATIVE NEGATIVE Final    Comment:        The GeneXpert MRSA Assay (FDA approved for NASAL specimens only), is one component of a comprehensive MRSA colonization surveillance program. It is not intended to diagnose MRSA infection nor to guide or monitor treatment for MRSA infections.     Radiology Studies: Mr Thoracic Spine W Wo Contrast  Result Date: 12/16/2015 CLINICAL DATA:  Invasive thoracic mass. Bilateral lower extremity anesthesia. EXAM: MRI THORACIC SPINE WITHOUT AND WITH CONTRAST TECHNIQUE: Multiplanar and multiecho pulse sequences of the thoracic spine were obtained without and with intravenous contrast. CONTRAST:  29mL MULTIHANCE GADOBENATE DIMEGLUMINE 529 MG/ML IV SOLN COMPARISON:  Chest CT 12/14/2015 FINDINGS: There is loss of the normal T1 weighted signal within the T6 and T7 vertebral bodies with associated contrast enhancement. This is in continuity with a posterior right chest wall mass that is better characterized on the recent chest CT. On the current examination, this mass measures approximately 4.4 x 4.1 cm. There is a peripherally enhancing collection along the dorsal aspect of the spinal canal extending from the level of the T5-T6 disc to the T9 level, measuring up to 5 mm in thickness. This contributes to severe attenuation of the thecal sac and compression of the spinal cord. There is increased T2 weighted signal within the spinal cord at the T7 and T8 levels suggesting compressive myelopathy.  Additionally, there is compression  deformity of the T7 body with approximately 50% height loss centrally and mild retropulsion. The other thoracic levels are unremarkable without spinal canal stenosis. IMPRESSION: 1. Abnormal signal and contrast enhancement of the T6 and T7 vertebral bodies with associated T7 compression fracture. This is likely secondary to direct extension of the process within the right lower lobe. While this may be extension of a pulmonary malignancy invading the vertebral column, an infectious process of the lung with an associated discitis-osteomyelitis is also a possibility. Given this constellation of findings, atypical infectious processes (such as tuberculosis) should be considered. 2. 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 3. 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 Critical Value/emergent results were called by telephone at the time of interpretation on 12/16/2015 at 6:53 pm to Dr. Raiford Noble , who verbally acknowledged these results. Electronically Signed   By: Ulyses Jarred M.D.   On: 12/16/2015 18:54   Scheduled Meds: .  ceFAZolin (ANCEF) IV  2 g Intravenous Q8H  . dexamethasone  10 mg Intravenous Q6H  . [START ON 12/17/2015] digoxin  0.25 mg Intravenous Once  . ipratropium-albuterol  3 mL Nebulization TID  . LORazepam  0.5 mg Oral Daily  . sodium chloride HYPERTONIC  4 mL Nebulization Daily  . timolol  1 drop Both Eyes BID   Continuous Infusions: . sodium chloride 100 mL/hr at 12/16/15 1234  . diltiazem (CARDIZEM) infusion 5 mg/hr (12/16/15 1931)    LOS: 2 days   Kerney Elbe, DO [facility] Pager [phone]  If 7PM-7AM, please contact night-coverage [email or web address] Password  [email or web address] 12/16/2015, 7:59 PM

## 2015-12-16 NOTE — Progress Notes (Addendum)
[facility] for Infect[facility]    Date of Admission:  12/14/2015   Total days of antibiotics 3        Day 2 cefazolin            ID: Marcus [name] is a 75 y.o. male with  Principal Problem:   Atrial fibrillation with rapid ventricular response ([place]) Active Problems:   Essential hypertension   Carotid stenosis   Diabetes ([place])   HLD (hyperlipidemia)   CVA (cerebral vascular accident) ([place])   Fall   CAP (community acquired pneumonia)   Compression fracture of vertebra ([place])   Urinary retention   New onset a-fib ([place])   Right lower lobe lung mass   Lower extremity weakness   Bacteremia    Subjective: Denies fevers but having episodic palpations with HR on tele in 130s. He states that he has decrease sensation to lower extremities, worsening, as well as worsening leg weakness  Family reports that he was walking on Thursday prior to his fall, then that is when he reported to have leg weakness. On admit, he was able to transition to chair with some assistance, and when he was diagnosed with thoracic compression fracture  Medications:  .  ceFAZolin (ANCEF) IV  2 g Intravenous Q8H  . dexamethasone  10 mg Intravenous Q6H  . digoxin  0.25 mg Intravenous Once  . [START ON 12/17/2015] digoxin  0.25 mg Intravenous Once  . LORazepam  0.5 mg Oral Daily  . timolol  1 drop Both Eyes BID    Objective: Vital signs in last 24 hours: Temp:  [97.7 F (36.5 C)-98.2 F (36.8 C)] 98.2 F (36.8 C) (10/16 1139) Pulse Rate:  [67-113] 98 (10/16 1139) Resp:  [17-28] 20 (10/16 1139) BP: (89-118)/(54-87) 107/81 (10/16 1139) SpO2:  [94 %-96 %] 96 % (10/16 1139) Weight:  [205 lb 12.8 oz (93.4 kg)] 205 lb 12.8 oz (93.4 kg) (10/16 0545)  Physical Exam  Constitutional: He is oriented to person, place, and time. He appears well-developed and well-nourished. No distress.  HENT:  Mouth/Throat: Oropharynx is clear and moist. No oropharyngeal exudate.  Cardiovascular: Normal rate, regular  rhythm and normal heart sounds. Exam reveals no gallop and no friction rub.  No murmur heard.  Pulmonary/Chest: Effort normal and breath sounds normal. No respiratory distress. He has no wheezes.  Abdominal: Soft. Bowel sounds are normal. He exhibits no distension. There is no tenderness.  Lymphadenopathy:  He has no cervical adenopathy.  Neurological: moves toes on left leg, but not on right. He has upgoing toe on right + babinski. He is unable to move legs today,( whereas yesterday 1-2/5 distal strength). Decrease sensation to LE bilaterally to level of knee Skin: Skin is warm and dry. No rash noted. No erythema.  Psychiatric: He has a normal mood and affect. His behavior is normal.     Lab Results  Recent Labs  12/15/15 0351 12/16/15 0432  WBC 13.1* 11.1*  HGB 10.5* 10.6*  HCT 32.7* 33.0*  NA 132* 134*  K 3.6 3.5  CL 96* 99*  CO2 28 27  BUN 16 22*  CREATININE 0.86 0.89   Liver Panel  Recent Labs  12/14/15 0635 12/16/15 0432  PROT 7.9 6.0*  ALBUMIN 1.9* 1.5*  AST 44* 87*  ALT 28 45  ALKPHOS 183* 146*  BILITOT 0.7 0.5  BILIDIR 0.1  --   IBILI 0.6  --    No results found for: Micheline Rough  Microbiology: 10/14 blood  cx staph aureus Studies/Results: Ct Abdomen Pelvis Wo Contrast  Result Date: 12/14/2015 CLINICAL DATA:  Abdominal distention and pain. Shortness of breath. Decreased appetite. EXAM: CT CHEST, ABDOMEN AND PELVIS WITHOUT CONTRAST TECHNIQUE: Multidetector CT imaging of the chest, abdomen and pelvis was performed following the standard protocol without IV contrast. COMPARISON:  CT abdomen pelvis 12/01/2015 and CT chest 07/07/2013. FINDINGS: CT CHEST FINDINGS Cardiovascular: Atherosclerotic calcification of the arterial vasculature, including three-vessel involvement of the coronary arteries. Heart size normal. There is pericardial calcification, indicative of prior pericarditis. No pericardial effusion. Mediastinum/Nodes: No pathologically enlarged  mediastinal or axillary lymph nodes. Hilar regions are difficult to definitively evaluate without IV contrast. Esophagus is grossly unremarkable. Lungs/Pleura: Mild centrilobular emphysema. Rounded masslike consolidation in the medial aspect of the superior segment right lower lobe measures approximately 4.7 x 4.9 cm, is new from 07/07/2013, has internal ossific/calcific debris and obscures the extrapleural fat plane. Soft tissue extends around the adjacent vertebral body into the left extrapleural space. Trace right pleural effusion. Lungs are otherwise clear. Musculoskeletal: Lucency and destruction of the T7 vertebral body is seen with surrounding soft tissue. There is lysis within the anterior aspect of the T6 vertebral body as well. Airway is unremarkable. CT ABDOMEN PELVIS FINDINGS Hepatobiliary: Liver and gallbladder are unremarkable. No biliary ductal dilatation. Pancreas: Negative. Spleen: Negative. Adrenals/Urinary Tract: Adrenal glands are unremarkable. Inferior aspects of both kidneys are obscured by streak artifact from spinal hardware. Low-attenuation lesions in the left kidney measure up to 2.0 cm and are difficult to further characterize without post-contrast imaging. Ureters are decompressed. There are scattered bladder diverticulum. Stomach/Bowel: Stomach, small bowel, appendix and colon are unremarkable. Vascular/Lymphatic: Atherosclerotic calcification of the arterial vasculature without abdominal aortic aneurysm. Postoperative changes are seen at the aortic bifurcation. No pathologically enlarged lymph nodes. Reproductive: Prostate is slightly enlarged and calcified. Other: No free fluid.  Mesenteries and peritoneum are unremarkable. Musculoskeletal: Postoperative changes in the lumbar spine. No additional worrisome lytic or sclerotic lesions. Bone graft harvest sites in the iliac wings. IMPRESSION: 1. Masslike consolidation in the right lower lobe with invasion of the extrapleural fat,  worrisome for primary bronchogenic carcinoma. Lymphoma and sarcoma are considered less likely. 2. Lytic destruction and compression of the T7 vertebral body with surrounding soft tissue, consistent with a pathologic fracture. Minimal involvement of the T6 vertebral body. 3. Trace right pleural effusion. 4. No evidence of primary malignancy or metastatic disease in the abdomen or pelvis. 5.  Aortic atherosclerosis (ICD10-170.0). Electronically Signed   By: Lorin Picket M.D.   On: 12/14/2015 14:21   Ct Chest Wo Contrast  Result Date: 12/14/2015 CLINICAL DATA:  Abdominal distention and pain. Shortness of breath. Decreased appetite. EXAM: CT CHEST, ABDOMEN AND PELVIS WITHOUT CONTRAST TECHNIQUE: Multidetector CT imaging of the chest, abdomen and pelvis was performed following the standard protocol without IV contrast. COMPARISON:  CT abdomen pelvis 12/01/2015 and CT chest 07/07/2013. FINDINGS: CT CHEST FINDINGS Cardiovascular: Atherosclerotic calcification of the arterial vasculature, including three-vessel involvement of the coronary arteries. Heart size normal. There is pericardial calcification, indicative of prior pericarditis. No pericardial effusion. Mediastinum/Nodes: No pathologically enlarged mediastinal or axillary lymph nodes. Hilar regions are difficult to definitively evaluate without IV contrast. Esophagus is grossly unremarkable. Lungs/Pleura: Mild centrilobular emphysema. Rounded masslike consolidation in the medial aspect of the superior segment right lower lobe measures approximately 4.7 x 4.9 cm, is new from 07/07/2013, has internal ossific/calcific debris and obscures the extrapleural fat plane. Soft tissue extends around the adjacent vertebral body into the  left extrapleural space. Trace right pleural effusion. Lungs are otherwise clear. Musculoskeletal: Lucency and destruction of the T7 vertebral body is seen with surrounding soft tissue. There is lysis within the anterior aspect of the T6  vertebral body as well. Airway is unremarkable. CT ABDOMEN PELVIS FINDINGS Hepatobiliary: Liver and gallbladder are unremarkable. No biliary ductal dilatation. Pancreas: Negative. Spleen: Negative. Adrenals/Urinary Tract: Adrenal glands are unremarkable. Inferior aspects of both kidneys are obscured by streak artifact from spinal hardware. Low-attenuation lesions in the left kidney measure up to 2.0 cm and are difficult to further characterize without post-contrast imaging. Ureters are decompressed. There are scattered bladder diverticulum. Stomach/Bowel: Stomach, small bowel, appendix and colon are unremarkable. Vascular/Lymphatic: Atherosclerotic calcification of the arterial vasculature without abdominal aortic aneurysm. Postoperative changes are seen at the aortic bifurcation. No pathologically enlarged lymph nodes. Reproductive: Prostate is slightly enlarged and calcified. Other: No free fluid.  Mesenteries and peritoneum are unremarkable. Musculoskeletal: Postoperative changes in the lumbar spine. No additional worrisome lytic or sclerotic lesions. Bone graft harvest sites in the iliac wings. IMPRESSION: 1. Masslike consolidation in the right lower lobe with invasion of the extrapleural fat, worrisome for primary bronchogenic carcinoma. Lymphoma and sarcoma are considered less likely. 2. Lytic destruction and compression of the T7 vertebral body with surrounding soft tissue, consistent with a pathologic fracture. Minimal involvement of the T6 vertebral body. 3. Trace right pleural effusion. 4. No evidence of primary malignancy or metastatic disease in the abdomen or pelvis. 5.  Aortic atherosclerosis (ICD10-170.0). Electronically Signed   By: Lorin Picket M.D.   On: 12/14/2015 14:21     Assessment/Plan: New onset lower extremity weakness = I am concerned that he has cord compression as it relates to his compression fracture. I have contacted Dr. Alfredia Ferguson to get Marcus [name] consult and see what needs to be done  emergently. He had more participation and ability to move legs yesterday on my exam more so than today.   Thoracic compression = noted on CT, would be possibly nidus for staph aureus bacteremia to cause osteo vs. Lytic lesion related to lung mass, which is either infection related vs. Malignancy  Lung mass = IR would like to have patient undergo tx of mssa bacteremia and see if also decrease in lung lesion to be more consistent with lung abscess.   [name] = recommend to get TEE after NSGY evaluation of LE weakness   Marcus [name], [facility] Cell: [phone] Pager: [phone]  12/16/2015, 12:31 PM

## 2015-12-16 NOTE — Progress Notes (Signed)
RN administered 1 tablet PO PRN Norco per MD order at 2134 due to patient complaint of pain (see flowsheet for pain assessment).  RN completed head to toe assessment and charted assessment at bedside, also administered scheduled evening medications.  RN getting ready to leave room about this time and patient asked if it was time for his pain pill.  RN reminded patient that he had taken his pain pill almost an hour ago.  Patient stated "no I haven't".  Patient's daughter present at bedside and stated to patient "yes you did, I was here when you took it just a bit ago".  Patient then asked for an Ativan.  RN administered.

## 2015-12-16 NOTE — Progress Notes (Addendum)
Per patient unable to move legs.  Slight movement noted in left leg upon RN request, no movement noted in right leg upon RN request.  RN offered every two hour turning/repositioning per patient he will let staff know when he wants to be repositioned.  RN discussed with patient that it is important to reposition his body to help prevent any skin issues.  Patient stated understanding and reiterated he will call and let staff know when he wanted to be repositioned.

## 2015-12-16 NOTE — Consult Note (Signed)
Reason for Consult: Pathologic fracture T7 Referring Physician: [name] is an 75 y.o. male.  HPI: 75 year old male without known history of malignancy. Patient with greater than 4 week history of urinary retention and thoracic chest wall pain radiating into his anterior chest. Patient treated for shingles without success. Patient has developed progressive weakness. Patient suffered fall 4 days ago and has been non-ambulatory since that time. Patient has been without voluntary movement of his lower extremities for greater than 24 hours. Patient with dense sensory loss from his lower thoracic region distally. Patient also with blood cultures positive for staph aureus.  Past Medical History:  Diagnosis Date  . Anxiety state, unspecified   . Calculus of kidney   . 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 Complication of anesthesia    " I woke up in the recovery room with tube in throat and panicked."  . 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 Herpes zoster without mention of complication   . History of hiatal hernia   . Hypertrophy of prostate with urinary obstruction and other lower urinary tract symptoms (LUTS)   . Insomnia, unspecified   . Lumbago   . Other and unspecified hyperlipidemia   . Pneumonia    bronchial  . Reflux esophagitis   . Seasonal allergies   . Stroke ([place])   . TIA (transient ischemic attack)   . Type II or unspecified type diabetes mellitus without mention of complication, not stated as uncontrolled   . Unspecified essential hypertension   . Unspecified vitamin D deficiency   . Urethral stricture unspecified   . Urinary frequency     Past Surgical History:  Procedure Laterality Date  . CARDIAC CATHETERIZATION  1986   normal, Dr Lia Foyer  . ENDARTERECTOMY Left 06/08/2014   Procedure: LEFT CAROTID ARTERY ENDARTERECTOMY;  Surgeon: Rosetta Posner, MD;  Location: [facility];  Service: Vascular;  Laterality: Left;  . EYE SURGERY  2014   cataract  .  EYE SURGERY  2013   Retina  . PATCH ANGIOPLASTY Left 06/08/2014   Procedure: WITH DACRON PATCH ANGIOPLASTY;  Surgeon: Rosetta Posner, MD;  Location: [facility];  Service: Vascular;  Laterality: Left;  . SPINAL FUSION  12/91,11/91   obtained bone from left lower leg  . stimulator 1991     spine  . stress thallium  1994    normal;  . stretching bladder  07/2007   Dr Risa Grill    Family History  Problem Relation Age of Onset  . Diabetes Mother   . Cancer Mother   . Hypertension Father   . Stroke Father   . Alcohol abuse Brother     Social History:  reports that he quit smoking about 24 years ago. His smoking use included Cigarettes. He has never used smokeless tobacco. He reports that he does not drink alcohol or use drugs.  Allergies:  Allergies  Allergen Reactions  . Codeine Other (See Comments)    constipation  . Demerol [Meperidine] Other (See Comments)    Drops blood pressure   . Contrast Media [Iodinated Diagnostic Agents] Nausea And Vomiting  . Gabapentin Other (See Comments)    Suicidal, depression  . Other Other (See Comments)    All narcotics make pt hallucinate    Medications: I have reviewed the patient's current medications.  Results for orders placed or performed during the hospital encounter of 12/14/15 (from the past 48 hour(s))  Urinalysis, Routine w reflex microscopic  Status: Abnormal   Collection Time: 12/14/15  4:22 PM  Result Value Ref Range   Color, Urine YELLOW YELLOW   APPearance CLOUDY (A) CLEAR   Specific Gravity, Urine 1.020 1.005 - 1.030   pH 7.0 5.0 - 8.0   Glucose, UA NEGATIVE NEGATIVE mg/dL   Hgb urine dipstick MODERATE (A) NEGATIVE   Bilirubin Urine NEGATIVE NEGATIVE   Ketones, ur NEGATIVE NEGATIVE mg/dL   Protein, ur 100 (A) NEGATIVE mg/dL   Nitrite POSITIVE (A) NEGATIVE   Leukocytes, UA SMALL (A) NEGATIVE  Urine culture     Status: Abnormal (Preliminary result)   Collection Time: 12/14/15  4:22 PM  Result Value Ref Range   Specimen  Description URINE, RANDOM    Special Requests NONE    Culture 20,000 COLONIES/mL STAPHYLOCOCCUS AUREUS (A)    Report Status PENDING   Strep pneumoniae urinary antigen     Status: None   Collection Time: 12/14/15  4:22 PM  Result Value Ref Range   Strep Pneumo Urinary Antigen NEGATIVE NEGATIVE    Comment:        Infection due to S. pneumoniae cannot be absolutely ruled out since the antigen present may be below the detection limit of the test.   Urine microscopic-add on     Status: Abnormal   Collection Time: 12/14/15  4:22 PM  Result Value Ref Range   Squamous Epithelial / LPF 0-5 (A) NONE SEEN   WBC, UA 6-30 0 - 5 WBC/hpf   RBC / HPF 6-30 0 - 5 RBC/hpf   Bacteria, UA FEW (A) NONE SEEN  TSH     Status: None   Collection Time: 12/14/15  5:07 PM  Result Value Ref Range   TSH 0.469 0.350 - 4.500 uIU/mL    Comment: Performed by a 3rd Generation assay with a functional sensitivity of <=0.01 uIU/mL.  Protime-INR     Status: None   Collection Time: 12/14/15  5:21 PM  Result Value Ref Range   Prothrombin Time 13.7 11.4 - 15.2 seconds   INR 1.04   MRSA PCR Screening     Status: None   Collection Time: 12/14/15 11:57 PM  Result Value Ref Range   MRSA by PCR NEGATIVE NEGATIVE    Comment:        The GeneXpert MRSA Assay (FDA approved for NASAL specimens only), is one component of a comprehensive MRSA colonization surveillance program. It is not intended to diagnose MRSA infection nor to guide or monitor treatment for MRSA infections.   Basic metabolic panel     Status: Abnormal   Collection Time: 12/15/15  3:51 AM  Result Value Ref Range   Sodium 132 (L) 135 - 145 mmol/L   Potassium 3.6 3.5 - 5.1 mmol/L   Chloride 96 (L) 101 - 111 mmol/L   CO2 28 22 - 32 mmol/L   Glucose, Bld 110 (H) 65 - 99 mg/dL   BUN 16 6 - 20 mg/dL   Creatinine, Ser 0.86 0.61 - 1.24 mg/dL   Calcium 8.6 (L) 8.9 - 10.3 mg/dL   GFR calc non Af Amer >60 >60 mL/min   GFR calc Af Amer >60 >60 mL/min     Comment: (NOTE) The eGFR has been calculated using the CKD EPI equation. This calculation has not been validated in all clinical situations. eGFR's persistently <60 mL/min signify possible Chronic Kidney Disease.    Anion gap 8 5 - 15  CBC     Status: Abnormal   Collection Time: 12/15/15  3:51 AM  Result Value Ref Range   WBC 13.1 (H) 4.0 - 10.5 K/uL   RBC 3.49 (L) 4.22 - 5.81 MIL/uL   Hemoglobin 10.5 (L) 13.0 - 17.0 g/dL   HCT 32.7 (L) 39.0 - 52.0 %   MCV 93.7 78.0 - 100.0 fL   MCH 30.1 26.0 - 34.0 pg   MCHC 32.1 30.0 - 36.0 g/dL   RDW 14.4 11.5 - 15.5 %   Platelets 275 150 - 400 K/uL  Lipid panel     Status: Abnormal   Collection Time: 12/15/15  3:51 AM  Result Value Ref Range   Cholesterol 52 0 - 200 mg/dL   Triglycerides 61 <150 mg/dL   HDL 10 (L) >40 mg/dL   Total CHOL/HDL Ratio 5.2 RATIO   VLDL 12 0 - 40 mg/dL   LDL Cholesterol 30 0 - 99 mg/dL    Comment:        Total Cholesterol/HDL:CHD Risk Coronary Heart Disease Risk Table                     Men   Women  1/2 Average Risk   3.4   3.3  Average Risk       5.0   4.4  2 X Average Risk   9.6   7.1  3 X Average Risk  23.4   11.0        Use the calculated Patient Ratio above and the CHD Risk Table to determine the patient's CHD Risk.        ATP III CLASSIFICATION (LDL):  <100     mg/dL   Optimal  100-129  mg/dL   Near or Above                    Optimal  130-159  mg/dL   Borderline  160-189  mg/dL   High  >190     mg/dL   Very High   CBC with Differential/Platelet     Status: Abnormal   Collection Time: 12/16/15  4:32 AM  Result Value Ref Range   WBC 11.1 (H) 4.0 - 10.5 K/uL   RBC 3.53 (L) 4.22 - 5.81 MIL/uL   Hemoglobin 10.6 (L) 13.0 - 17.0 g/dL   HCT 33.0 (L) 39.0 - 52.0 %   MCV 93.5 78.0 - 100.0 fL   MCH 30.0 26.0 - 34.0 pg   MCHC 32.1 30.0 - 36.0 g/dL   RDW 14.4 11.5 - 15.5 %   Platelets 286 150 - 400 K/uL   Neutrophils Relative % 84 %   Neutro Abs 9.4 (H) 1.7 - 7.7 K/uL   Lymphocytes Relative 7 %    Lymphs Abs 0.8 0.7 - 4.0 K/uL   Monocytes Relative 8 %   Monocytes Absolute 0.8 0.1 - 1.0 K/uL   Eosinophils Relative 1 %   Eosinophils Absolute 0.1 0.0 - 0.7 K/uL   Basophils Relative 0 %   Basophils Absolute 0.0 0.0 - 0.1 K/uL  Comprehensive metabolic panel     Status: Abnormal   Collection Time: 12/16/15  4:32 AM  Result Value Ref Range   Sodium 134 (L) 135 - 145 mmol/L   Potassium 3.5 3.5 - 5.1 mmol/L   Chloride 99 (L) 101 - 111 mmol/L   CO2 27 22 - 32 mmol/L   Glucose, Bld 99 65 - 99 mg/dL   BUN 22 (H) 6 - 20 mg/dL   Creatinine, Ser 0.89 0.61 - 1.24 mg/dL  Calcium 8.5 (L) 8.9 - 10.3 mg/dL   Total Protein 6.0 (L) 6.5 - 8.1 g/dL   Albumin 1.5 (L) 3.5 - 5.0 g/dL   AST 87 (H) 15 - 41 U/L   ALT 45 17 - 63 U/L   Alkaline Phosphatase 146 (H) 38 - 126 U/L   Total Bilirubin 0.5 0.3 - 1.2 mg/dL   GFR calc non Af Amer >60 >60 mL/min   GFR calc Af Amer >60 >60 mL/min    Comment: (NOTE) The eGFR has been calculated using the CKD EPI equation. This calculation has not been validated in all clinical situations. eGFR's persistently <60 mL/min signify possible Chronic Kidney Disease.    Anion gap 8 5 - 15  Magnesium     Status: None   Collection Time: 12/16/15  4:32 AM  Result Value Ref Range   Magnesium 2.1 1.7 - 2.4 mg/dL  Phosphorus     Status: None   Collection Time: 12/16/15  4:32 AM  Result Value Ref Range   Phosphorus 3.2 2.5 - 4.6 mg/dL    No results found.  Pertinent items noted in HPI and remainder of comprehensive ROS otherwise negative. Blood pressure 101/80, pulse 98, temperature 98.2 F (36.8 C), temperature source Oral, resp. rate 19, height 5' 7.5" (1.715 m), weight 93.4 kg (205 lb 12.8 oz), SpO2 93 %. Patient is awake and alert. He is oriented and appropriate. His speech is fluent. His judgment and insight are intact. Motor and sensory function of his upper extremities is normal. Patient with 0/5 strength in bilateral lower extremities. Dense sensory level at  approximately T10. Reflexes hypoactive in both lower extremities. Patient with upgoing toes to plantar stimulation.  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 Awaiting MRI scan for possible determination as to surgical options. Given lack of motor function surgery unlikely to restore any meaningful neurologic function.  Marcus [name] A 12/16/2015, 3:26 PM

## 2015-12-16 NOTE — Progress Notes (Addendum)
ANTICOAGULATION CONSULT NOTE - Initial Consult  Pharmacy Consult for apixiban Indication: atrial fibrillation  Allergies  Allergen Reactions  . Codeine Other (See Comments)    constipation  . Demerol [Meperidine] Other (See Comments)    Drops blood pressure   . Contrast Media [Iodinated Diagnostic Agents] Nausea And Vomiting  . Gabapentin Other (See Comments)    Suicidal, depression  . Other Other (See Comments)    All narcotics make pt hallucinate    Patient Measurements: Height: 5' 7.5" (171.5 cm) Weight: 205 lb 12.8 oz (93.4 kg) IBW/kg (Calculated) : 67.25   Vital Signs: Temp: 98.1 F (36.7 C) (10/16 0758) Temp Source: Oral (10/16 0758) BP: 98/65 (10/16 0758) Pulse Rate: 113 (10/16 0758)  Labs:  Recent Labs  12/14/15 0635 12/14/15 1721 12/15/15 0351 12/16/15 0432  HGB 11.7*  --  10.5* 10.6*  HCT 35.7*  --  32.7* 33.0*  PLT 291  --  275 286  LABPROT  --  13.7  --   --   INR  --  1.04  --   --   CREATININE 0.79  --  0.86 0.89    Estimated Creatinine Clearance: 82.5 mL/min (by C-G formula based on SCr of 0.89 mg/dL).   Medical History: Past Medical History:  Diagnosis Date  . Anxiety state, unspecified   . Calculus of kidney   . 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 Complication of anesthesia    " I woke up in the recovery room with tube in throat and panicked."  . 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 Herpes zoster without mention of complication   . History of hiatal hernia   . Hypertrophy of prostate with urinary obstruction and other lower urinary tract symptoms (LUTS)   . Insomnia, unspecified   . Lumbago   . Other and unspecified hyperlipidemia   . Pneumonia    bronchial  . Reflux esophagitis   . Seasonal allergies   . Stroke ([place])   . TIA (transient ischemic attack)   . Type II or unspecified type diabetes mellitus without mention of complication, not stated as uncontrolled   . Unspecified essential hypertension   .  Unspecified vitamin D deficiency   . Urethral stricture unspecified   . Urinary frequency     Medications:  Scheduled:  .  ceFAZolin (ANCEF) IV  2 g Intravenous Q8H  . digoxin  0.25 mg Intravenous Once  . [START ON 12/17/2015] digoxin  0.25 mg Intravenous Once  . digoxin  0.5 mg Intravenous Once  . LORazepam  0.5 mg Oral Daily  . potassium chloride  40 mEq Oral Once  . timolol  1 drop Both Eyes BID    Assessment: 75 yo male with afib and found with lung mass (malignancy vs infection). No plans now for biopsy per radiology notes. Spoke with Dr. Oval Linsey and will begin oral anticoagulation. Patient is noted on plavix PTA for hx TIA -hg= 10.6, plt-186 -SCr= 0.89, wt= 93.4kg   Goal of Therapy:  Monitor platelets by anticoagulation protocol: Yes   Plan:  -Apixiban 5mg  po bid -Will provide patient education -With addition of apixiban, plavix is likely no longer needed (no cardiac indication noted)  Hildred Laser, Pharm D 12/16/2015 10:04 AM  Addendum -plans still pending for lung biopsy and apixiban is on hold -apixiban 5mg  po x1 given this am  [place] on hold until plans for biopsy are confirmed  Hildred Laser, Pharm D 12/16/2015 2:28 PM

## 2015-12-16 NOTE — Progress Notes (Signed)
Repeat discussion had with [name] and Dr. Alfredia Ferguson.  Due to significant leg weakness and worsening of this, a neurosurgery consult has been requested.  It appears that this compression fracture may be pathologic.  [name] is discussing this scan with neuroradiologist to determine if the patient can have an MRI.  Further work up is needed and opinions from other consultants prior to determining if a lung biopsy is warranted.  Marcus [name] E 1:41 PM 12/16/2015

## 2015-12-17 ENCOUNTER — Inpatient Hospital Stay (HOSPITAL_COMMUNITY): Payer: Medicare Other

## 2015-12-17 ENCOUNTER — Encounter: Payer: Self-pay | Admitting: Radiation Oncology

## 2015-12-17 ENCOUNTER — Encounter (HOSPITAL_COMMUNITY): Payer: Self-pay | Admitting: General Surgery

## 2015-12-17 ENCOUNTER — Ambulatory Visit
Admit: 2015-12-17 | Discharge: 2015-12-17 | Disposition: A | Discharge status: Home / Self Care | Payer: Medicare Other | Attending: Radiation Oncology | Admitting: Radiation Oncology

## 2015-12-17 VITALS — BP 119/73 | HR 70

## 2015-12-17 DIAGNOSIS — S22069A Unspecified fracture of T7-T8 vertebra, initial encounter for closed fracture: Secondary | ICD-10-CM | POA: Diagnosis present

## 2015-12-17 DIAGNOSIS — S22069G Unspecified fracture of T7-T8 vertebra, subsequent encounter for fracture with delayed healing: Secondary | ICD-10-CM

## 2015-12-17 DIAGNOSIS — G893 Neoplasm related pain (acute) (chronic): Secondary | ICD-10-CM

## 2015-12-17 DIAGNOSIS — I4891 Unspecified atrial fibrillation: Secondary | ICD-10-CM

## 2015-12-17 DIAGNOSIS — I6523 Occlusion and stenosis of bilateral carotid arteries: Secondary | ICD-10-CM

## 2015-12-17 LAB — COMPREHENSIVE METABOLIC PANEL
ALK PHOS: 138 U/L — AB (ref 38–126)
ALT: 50 U/L (ref 17–63)
ANION GAP: 8 (ref 5–15)
AST: 116 U/L — ABNORMAL HIGH (ref 15–41)
Albumin: 1.4 g/dL — ABNORMAL LOW (ref 3.5–5.0)
BILIRUBIN TOTAL: 0.3 mg/dL (ref 0.3–1.2)
BUN: 27 mg/dL — ABNORMAL HIGH (ref 6–20)
CALCIUM: 8.4 mg/dL — AB (ref 8.9–10.3)
CO2: 24 mmol/L (ref 22–32)
Chloride: 102 mmol/L (ref 101–111)
Creatinine, Ser: 0.8 mg/dL (ref 0.61–1.24)
GFR calc non Af Amer: >60 mL/min (ref >60)
Glucose, Bld: 193 mg/dL — ABNORMAL HIGH (ref 65–99)
Potassium: 4.2 mmol/L (ref 3.5–5.1)
SODIUM: 134 mmol/L — AB (ref 135–145)
TOTAL PROTEIN: 5.9 g/dL — AB (ref 6.5–8.1)

## 2015-12-17 LAB — CBC WITH DIFFERENTIAL/PLATELET
Basophils Absolute: 0 10*3/uL (ref 0.0–0.1)
Basophils Relative: 0 %
EOS ABS: 0 10*3/uL (ref 0.0–0.7)
Eosinophils Relative: 0 %
HEMATOCRIT: 35.4 % — AB (ref 39.0–52.0)
HEMOGLOBIN: 11.4 g/dL — AB (ref 13.0–17.0)
LYMPHS ABS: 0.5 10*3/uL — AB (ref 0.7–4.0)
Lymphocytes Relative: 8 %
MCH: 30.1 pg (ref 26.0–34.0)
MCHC: 32.2 g/dL (ref 30.0–36.0)
MCV: 93.4 fL (ref 78.0–100.0)
MONO ABS: 0.1 10*3/uL (ref 0.1–1.0)
MONOS PCT: 2 %
Neutro Abs: 5.9 10*3/uL (ref 1.7–7.7)
Neutrophils Relative %: 90 %
Platelets: 321 10*3/uL (ref 150–400)
RBC: 3.79 MIL/uL — ABNORMAL LOW (ref 4.22–5.81)
RDW: 14.2 % (ref 11.5–15.5)
WBC: 6.5 10*3/uL (ref 4.0–10.5)

## 2015-12-17 LAB — URINE CULTURE

## 2015-12-17 LAB — CULTURE, BLOOD (ROUTINE X 2)

## 2015-12-17 LAB — ECHOCARDIOGRAM COMPLETE
HEIGHTINCHES: 67.5 in
Weight: 3329.6 oz

## 2015-12-17 LAB — MAGNESIUM: Magnesium: 2.2 mg/dL (ref 1.7–2.4)

## 2015-12-17 LAB — LACTIC ACID, PLASMA: LACTIC ACID, VENOUS: 1.2 mmol/L (ref 0.5–1.9)

## 2015-12-17 LAB — PHOSPHORUS: PHOSPHORUS: 3 mg/dL (ref 2.5–4.6)

## 2015-12-17 MED ORDER — DIGOXIN 250 MCG PO TABS
0.2500 mg | ORAL_TABLET | Freq: Every day | ORAL | Status: DC
  Administered 2015-12-17 – 2015-12-20 (×4): 0.25 mg via ORAL
  Filled 2015-12-17 (×4): qty 1

## 2015-12-17 MED ORDER — DIGOXIN 0.25 MG/ML IJ SOLN
0.2500 mg | Freq: Once | INTRAMUSCULAR | Status: AC
  Administered 2015-12-17: 0.25 mg via INTRAVENOUS
  Filled 2015-12-17: qty 2

## 2015-12-17 MED ORDER — DILTIAZEM HCL 60 MG PO TABS
60.0000 mg | ORAL_TABLET | Freq: Four times a day (QID) | ORAL | Status: DC
  Administered 2015-12-17 – 2015-12-18 (×6): 60 mg via ORAL
  Filled 2015-12-17 (×6): qty 1

## 2015-12-17 MED ORDER — MORPHINE SULFATE 4 MG/ML IJ SOLN
2.0000 mg | INTRAMUSCULAR | Status: AC
  Administered 2015-12-17: 2 mg via INTRAVENOUS
  Filled 2015-12-17: qty 1

## 2015-12-17 MED ORDER — DILTIAZEM HCL-DEXTROSE 100-5 MG/100ML-% IV SOLN (PREMIX): 5.0000 mg/h | INTRAVENOUS | Status: DC

## 2015-12-17 MED ORDER — DILTIAZEM HCL ER COATED BEADS 120 MG PO CP24
120.0000 mg | ORAL_CAPSULE | Freq: Every day | ORAL | Status: DC
  Filled 2015-12-17: qty 1

## 2015-12-17 MED ORDER — MORPHINE SULFATE 4 MG/ML IJ SOLN
4.0000 mg | INTRAMUSCULAR | Status: AC
  Administered 2015-12-17: 4 mg via INTRAVENOUS
  Filled 2015-12-17: qty 1

## 2015-12-17 MED ORDER — HEPARIN SODIUM (PORCINE) 5000 UNIT/ML IJ SOLN
5000.0000 [IU] | Freq: Three times a day (TID) | INTRAMUSCULAR | Status: DC
  Administered 2015-12-18 – 2015-12-20 (×5): 5000 [IU] via SUBCUTANEOUS
  Filled 2015-12-17 (×5): qty 1

## 2015-12-17 NOTE — Progress Notes (Signed)
The patient's MRI of the spine has been reviewed also by Dr. [name]. Without tissue confirmation, Dr. [name] feels that the patient should begin simulation and radiation treatment today to see if we can make any meaningful impact on his paralysis. The patient's wife is informed and Dr. Alfredia Ferguson is also informed. We will transfer with care link to [facility] for simulation and mark and start, as well as have care link transfer him back to [facility] after treatment today. Dr. Alfredia Ferguson will have his partner who will take over his care tomorrow be aware of our request to transfer him to [facility] after biopsy. We will plan 10 fractions of radiotherapy over 2 weeks.      Carola Rhine, PAC

## 2015-12-17 NOTE — Progress Notes (Signed)
Assisted with transfer onto carelink stretch. Patient drowsy from morphine. Vitals stable. No distress noted. Phoned Josh, RN on [address] with report. Explained morphine 4 mg IV was given at 1415 and 2 mg at 1514. Patient reports thoracic spine pain 7 on a scale of 0-10.

## 2015-12-17 NOTE — Progress Notes (Signed)
  Echocardiogram 2D Echocardiogram has been performed.  Darlina Sicilian M 12/17/2015, 9:34 AM

## 2015-12-17 NOTE — Progress Notes (Signed)
  Radiation Oncology         [phone] ________________________________  Name: Marcus [name] MRN: [id number]  Date: 12/17/2015  DOB: [birth year]  INPATIENT  SIMULATION AND TREATMENT PLANNING NOTE    ICD-9-CM ICD-10-CM   1. Neoplasm related pain 338.3 G89.3 morphine 4 MG/ML injection 4 mg  2. 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  NARRATIVE:  The patient was brought to the [facility].  Identity was confirmed.  All relevant records and images related to the planned course of therapy were reviewed.  The patient freely provided informed written consent to proceed with treatment after reviewing the details related to the planned course of therapy. The consent form was witnessed and verified by the simulation staff.  Then, the patient was set-up in a stable reproducible  supine position for radiation therapy.  CT images were obtained.  Surface markings were placed.  The CT images were loaded into the planning software.  Then the target and avoidance structures were contoured.  Treatment planning then occurred.  The radiation prescription was entered and confirmed.  Then, I designed and supervised the construction of a total of 2 medically necessary complex treatment devices.  I have requested : Isodose Plan.  PLAN:  If malignancy confirmed by biopsy, the patient will receive 30 Gy in 10 fractions.  ________________________________  Sheral Apley [name], M.D.

## 2015-12-17 NOTE — Consult Note (Signed)
Chief Complaint: Lung mass with paraspinous involvement  Referring Physician:Dr. Kerney Elbe  Supervising Physician: Corrie Mckusick  Patient Status: In-pt  HPI: Marcus [name] is an 75 y.o. male who was admitted with new onset A. Fib with RVR, FTT, CAP, RLL lung mass, and a pathologic fracture of T7.  After multiple imaging has been completed, it's not with 400% certainty whether this lesion in the lung is infectious or malignant.  However, we have been requested to biopsy this lesion to help determine this etiology.  Unfortunately, it appears that this process in the lung has transitioned into the spine.  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  Neurosurgery has seen the patient and does not think he would benefit from an operation and therefore, no biopsy can be obtained in that way.  Therefore, we have been asked to see the patient for a lung biopsy.  Past Medical History:  Past Medical History:  Diagnosis Date  . Anxiety state, unspecified   . Calculus of kidney   . 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 Complication of anesthesia    " I woke up in the recovery room with tube in throat and panicked."  . 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 Herpes zoster without mention of complication   . History of hiatal hernia   . Hypertrophy of prostate with urinary obstruction and other lower urinary tract symptoms (LUTS)   . Insomnia, unspecified   . Lumbago   . Other and unspecified hyperlipidemia   . Pneumonia    bronchial  . Reflux esophagitis   . Seasonal allergies   . Stroke ([place])   . TIA (transient ischemic attack)   . Type II or unspecified type diabetes mellitus without mention of complication, not stated as uncontrolled   . Unspecified essential hypertension   . Unspecified vitamin D deficiency   . Urethral stricture unspecified   . Urinary frequency     Past Surgical  History:  Past Surgical History:  Procedure Laterality Date  . CARDIAC CATHETERIZATION  1986   normal, Dr Lia Foyer  . ENDARTERECTOMY Left 06/08/2014   Procedure: LEFT CAROTID ARTERY ENDARTERECTOMY;  Surgeon: Rosetta Posner, MD;  Location: [facility];  Service: Vascular;  Laterality: Left;  . EYE SURGERY  2014   cataract  . EYE SURGERY  2013   Retina  . PATCH ANGIOPLASTY Left 06/08/2014   Procedure: WITH DACRON PATCH ANGIOPLASTY;  Surgeon: Rosetta Posner, MD;  Location: [facility];  Service: Vascular;  Laterality: Left;  . SPINAL FUSION  12/91,11/91   obtained bone from left lower leg  . stimulator 1991     spine  . stress thallium  1994    normal;  . stretching bladder  07/2007   Dr Risa Grill    Family History:  Family History  Problem Relation Age of Onset  . Diabetes Mother   . Cancer Mother   . Hypertension Father   . Stroke Father   . Alcohol abuse Brother     Social History:  reports that he quit smoking about 24 years ago. His smoking use included Cigarettes. He has never used smokeless tobacco. He reports that he does not drink alcohol or use drugs.  Allergies:  Allergies  Allergen Reactions  . Codeine Other (See Comments)    constipation  . Demerol [Meperidine] Other (See Comments)    Drops  blood pressure   . Contrast Media [Iodinated Diagnostic Agents] Nausea And Vomiting  . Gabapentin Other (See Comments)    Suicidal, depression  . Other Other (See Comments)    All narcotics make pt hallucinate    Medications: Medications reviewed in Epic Last dose of Plavix was on 10/13 Last dose of Eliquis on 10/16  Please HPI for pertinent positives, otherwise complete 10 system ROS negative.  Mallampati Score: MD Evaluation Airway: WNL Heart: WNL Abdomen: WNL Chest/ Lungs: WNL ASA  Classification: 3 Mallampati/Airway Score: Two  Physical Exam: BP 97/63 (BP Location: Right Arm)   Pulse 74   Temp 98.3 F (36.8 C) (Oral)   Resp 20   Ht 5' 7.5" (1.715 m)   Wt 208 lb 1.6 oz  (94.4 kg)   SpO2 96%   BMI 32.11 kg/m  Body mass index is 32.11 kg/m. General: pleasant, WD, WN white male who is laying in bed in NAD HEENT: head is normocephalic, atraumatic.  Sclera are noninjected.  PERRL.  Ears and nose without any masses or lesions.  Mouth is pink and moist Heart: irregularly irregular.  Normal s1,s2. No obvious murmurs, gallops, or rubs noted.  Palpable radial and pedal pulses bilaterally Lungs: CTAB, no wheezes, rhonchi, or rales noted.  Respiratory effort nonlabored Abd: soft, NT, ND, +BS, no masses, hernias, or organomegaly Psych: A&Ox3 with an appropriate affect.   Labs: Results for orders placed or performed during the hospital encounter of 12/14/15 (from the past 48 hour(s))  CBC with Differential/Platelet     Status: Abnormal   Collection Time: 12/16/15  4:32 AM  Result Value Ref Range   WBC 11.1 (H) 4.0 - 10.5 K/uL   RBC 3.53 (L) 4.22 - 5.81 MIL/uL   Hemoglobin 10.6 (L) 13.0 - 17.0 g/dL   HCT 33.0 (L) 39.0 - 52.0 %   MCV 93.5 78.0 - 100.0 fL   MCH 30.0 26.0 - 34.0 pg   MCHC 32.1 30.0 - 36.0 g/dL   RDW 14.4 11.5 - 15.5 %   Platelets 286 150 - 400 K/uL   Neutrophils Relative % 84 %   Neutro Abs 9.4 (H) 1.7 - 7.7 K/uL   Lymphocytes Relative 7 %   Lymphs Abs 0.8 0.7 - 4.0 K/uL   Monocytes Relative 8 %   Monocytes Absolute 0.8 0.1 - 1.0 K/uL   Eosinophils Relative 1 %   Eosinophils Absolute 0.1 0.0 - 0.7 K/uL   Basophils Relative 0 %   Basophils Absolute 0.0 0.0 - 0.1 K/uL  Comprehensive metabolic panel     Status: Abnormal   Collection Time: 12/16/15  4:32 AM  Result Value Ref Range   Sodium 134 (L) 135 - 145 mmol/L   Potassium 3.5 3.5 - 5.1 mmol/L   Chloride 99 (L) 101 - 111 mmol/L   CO2 27 22 - 32 mmol/L   Glucose, Bld 99 65 - 99 mg/dL   BUN 22 (H) 6 - 20 mg/dL   Creatinine, Ser 0.89 0.61 - 1.24 mg/dL   Calcium 8.5 (L) 8.9 - 10.3 mg/dL   Total Protein 6.0 (L) 6.5 - 8.1 g/dL   Albumin 1.5 (L) 3.5 - 5.0 g/dL   AST 87 (H) 15 - 41 U/L   ALT  45 17 - 63 U/L   Alkaline Phosphatase 146 (H) 38 - 126 U/L   Total Bilirubin 0.5 0.3 - 1.2 mg/dL   GFR calc non Af Amer >60 >60 mL/min   GFR calc Af Amer >60 >60  mL/min    Comment: (NOTE) The eGFR has been calculated using the CKD EPI equation. This calculation has not been validated in all clinical situations. eGFR's persistently <60 mL/min signify possible Chronic Kidney Disease.    Anion gap 8 5 - 15  Magnesium     Status: None   Collection Time: 12/16/15  4:32 AM  Result Value Ref Range   Magnesium 2.1 1.7 - 2.4 mg/dL  Phosphorus     Status: None   Collection Time: 12/16/15  4:32 AM  Result Value Ref Range   Phosphorus 3.2 2.5 - 4.6 mg/dL  CBC with Differential/Platelet     Status: Abnormal   Collection Time: 12/17/15  3:37 AM  Result Value Ref Range   WBC 6.5 4.0 - 10.5 K/uL   RBC 3.79 (L) 4.22 - 5.81 MIL/uL   Hemoglobin 11.4 (L) 13.0 - 17.0 g/dL   HCT 35.4 (L) 39.0 - 52.0 %   MCV 93.4 78.0 - 100.0 fL   MCH 30.1 26.0 - 34.0 pg   MCHC 32.2 30.0 - 36.0 g/dL   RDW 14.2 11.5 - 15.5 %   Platelets 321 150 - 400 K/uL   Neutrophils Relative % 90 %   Neutro Abs 5.9 1.7 - 7.7 K/uL   Lymphocytes Relative 8 %   Lymphs Abs 0.5 (L) 0.7 - 4.0 K/uL   Monocytes Relative 2 %   Monocytes Absolute 0.1 0.1 - 1.0 K/uL   Eosinophils Relative 0 %   Eosinophils Absolute 0.0 0.0 - 0.7 K/uL   Basophils Relative 0 %   Basophils Absolute 0.0 0.0 - 0.1 K/uL  Comprehensive metabolic panel     Status: Abnormal   Collection Time: 12/17/15  3:37 AM  Result Value Ref Range   Sodium 134 (L) 135 - 145 mmol/L   Potassium 4.2 3.5 - 5.1 mmol/L   Chloride 102 101 - 111 mmol/L   CO2 24 22 - 32 mmol/L   Glucose, Bld 193 (H) 65 - 99 mg/dL   BUN 27 (H) 6 - 20 mg/dL   Creatinine, Ser 0.80 0.61 - 1.24 mg/dL   Calcium 8.4 (L) 8.9 - 10.3 mg/dL   Total Protein 5.9 (L) 6.5 - 8.1 g/dL   Albumin 1.4 (L) 3.5 - 5.0 g/dL   AST 116 (H) 15 - 41 U/L   ALT 50 17 - 63 U/L   Alkaline Phosphatase 138 (H) 38 - 126 U/L    Total Bilirubin 0.3 0.3 - 1.2 mg/dL   GFR calc non Af Amer >60 >60 mL/min   GFR calc Af Amer >60 >60 mL/min    Comment: (NOTE) The eGFR has been calculated using the CKD EPI equation. This calculation has not been validated in all clinical situations. eGFR's persistently <60 mL/min signify possible Chronic Kidney Disease.    Anion gap 8 5 - 15  Magnesium     Status: None   Collection Time: 12/17/15  3:37 AM  Result Value Ref Range   Magnesium 2.2 1.7 - 2.4 mg/dL  Phosphorus     Status: None   Collection Time: 12/17/15  3:37 AM  Result Value Ref Range   Phosphorus 3.0 2.5 - 4.6 mg/dL  Lactic acid, plasma     Status: None   Collection Time: 12/17/15  3:37 AM  Result Value Ref Range   Lactic Acid, Venous 1.2 0.5 - 1.9 mmol/L    Imaging: Mr Thoracic Spine W Wo Contrast  Result Date: 12/16/2015 CLINICAL DATA:  Invasive thoracic mass. Bilateral  lower extremity anesthesia. EXAM: MRI THORACIC SPINE WITHOUT AND WITH CONTRAST TECHNIQUE: Multiplanar and multiecho pulse sequences of the thoracic spine were obtained without and with intravenous contrast. CONTRAST:  47m MULTIHANCE GADOBENATE DIMEGLUMINE 529 MG/ML IV SOLN COMPARISON:  Chest CT 12/14/2015 FINDINGS: There is loss of the normal T1 weighted signal within the T6 and T7 vertebral bodies with associated contrast enhancement. This is in continuity with a posterior right chest wall mass that is better characterized on the recent chest CT. On the current examination, this mass measures approximately 4.4 x 4.1 cm. There is a peripherally enhancing collection along the dorsal aspect of the spinal canal extending from the level of the T5-T6 disc to the T9 level, measuring up to 5 mm in thickness. This contributes to severe attenuation of the thecal sac and compression of the spinal cord. There is increased T2 weighted signal within the spinal cord at the T7 and T8 levels suggesting compressive myelopathy. Additionally, there is compression  deformity of the T7 body with approximately 50% height loss centrally and mild retropulsion. The other thoracic levels are unremarkable without spinal canal stenosis. IMPRESSION: 1. Abnormal signal and contrast enhancement of the T6 and T7 vertebral bodies with associated T7 compression fracture. This is likely secondary to direct extension of the process within the right lower lobe. While this may be extension of a pulmonary malignancy invading the vertebral column, an infectious process of the lung with an associated discitis-osteomyelitis is also a possibility. Given this constellation of findings, atypical infectious processes (such as tuberculosis) should be considered. 2. 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 3. 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 Critical Value/emergent results were called by telephone at the time of interpretation on 12/16/2015 at 6:53 pm to Dr. ORaiford Noble, who verbally acknowledged these results. Electronically Signed   By: KUlyses JarredM.D.   On: 12/16/2015 18:54    Assessment/Plan 1. RLL lung lesion -it is unclear whether this lesion is truly infectious vs malignant.  Therefore, when we take his biopsy tomorrow we will send it for cytology, pathology, and culture (fungal, aerobic, and TB) - his eliquis and plavix have been held for appropriate amounts of time as of tomorrow 10/18. -heparin will be held tomorrow before his procedure -NPO p MN -[facility] and [facility] will [facility]link him [facility] here tomorrow for his biopsy [facility] -Risks and Benefits discussed with the patient including, but not limited to bleeding, hemoptysis, respiratory failure requiring  intubation, infection, pneumothorax requiring chest tube placement, stroke from air embolism or even death. All of the patient's questions were answered, patient is agreeable to proceed. Consent signed and in chart.   Thank you for this interesting consult.  I greatly enjoyed meeting Marcus LENDERMANand look forward to participating in their care.  A copy of this report was sent to the requesting provider on this date.  Electronically Signed: OHenreitta Cea10/17/2017, 11:52 AM   I spent a total of 40 Minutes    in face to face in clinical consultation, greater than 50% of which was counseling/coordinating care for RLL lung mass

## 2015-12-17 NOTE — Progress Notes (Signed)
HR sustained in the 150's-low 160's in atrial flutter, EKG completed.  BP 89-102/60-70's, patient complains of feeling lightheaded. Cardiology text paged with this information via Amion.

## 2015-12-17 NOTE — Progress Notes (Signed)
PROGRESS NOTE    Marcus [name]  [id number] DOB: [birth year] DOA: 12/14/2015 PCP: Marcus Hubert, MD  Brief Narrative:  75 y.o. male with medical history significant for urethral stricture, hypertension, diabetes, TIA, GERD, BPH, tremor, since to the emergency Department chief complaint of generalized weakness and recent fall. Initial evaluation revealed right lower lobe lung mass, closed fracture of 7 thoracic vertebrae, urinary retention, A. fib with RVR. He was worked up and found to have an [name]. Patient has been essentially bedbound and yesterday he had sensation in his legs and today he did not. Neurosurgery consulted and MRI of spine done. Unfortunately no Neurosurgical Intervention was done as Neurosurgeon believes he Infarcted spinal cord. Work up for [employer] concerning for Malignancy vs. Atypical Pneumonia with communication to the Spine causing a Discitis/Osteomyelitis. Other issues included paroxysmal Atrial Fibrillation with RVR which then converted to NSR. Received a Call from Marcus [name] and Radiation Oncologist Dr. Tammi [name] thinks that Radiation Treatments (even without current diagnosesis) might be beneficial to begin simulation and radiation treatment to see if it can make any meaningful impact on his paralysis.   Assessment & Plan:   Principal Problem:   Atrial fibrillation with rapid ventricular response (HCC) Active Problems:   Essential hypertension   Carotid stenosis   Diabetes (HCC)   HLD (hyperlipidemia)   CVA (cerebral vascular accident) ([place])   Fall   CAP (community acquired pneumonia)   Compression fracture of vertebra ([place])   Urinary retention   New onset a-fib ([place])   Right lower lobe lung mass   Lower extremity weakness   Bacteremia   Closed fracture of seventh thoracic vertebra (HCC)   Decreased sensation  Right Lower Lobe Consolidation with Invasion into the Vertebral Column Concerning for Bronchogenic Malignancy vs. Atypical Pneumonia with  an associated Discitis-Osteomyelitis, poA -Unclear at this point if it is a Malignancy but favoring PNA with associated with Discitis/Osteomyeltits given [name] -MRI showed Peripherally enhancing collection along the dorsal aspect of the spinal canal is most consistent with epidural abscess, measuring up to 5 mm in thickness. -RLL 4.7 x 5 cm mass like consolidation  -Family admits to Early Satiety, Weight Loss and Pain out of Proportion -C/w Cefazolin for Abx Coverage, DuoNebs q6h and q2hprn -Guaifenesin-Dextromethorphan po q4h  And Flutter Valve. Added Hypertonic Saline Nebs. -Obtain Sputum Cx -IV Steroids with Dexamethasone -ID Marcus [name] Following and recommended IV Cefazolin for 4 weeks; Patient will need PICC Line for continued Abx for [name].  -Discussed Case with Marcus [name] of Interventional Radiology and they will take a Biopsy of it Tomorrow as well as a Paraspinal Biopsy and will send it for Cytology, Pathology, and Culture (fungal, aerobic, and Tb).  -CXR today showed Abnormal opacity overlies the right hilum/suprahilar region consistent with the previously described soft tissue mass involving the posterior medial right lower lobe and thoracic vertebral column. Favor infectious or inflammatory process as noted above. Small right pleural effusion. -Repeat CXR and CBC in AM -Patient to be NPO at [facility] for Biopsy  T7 Compression Fracture 2/2 to Direct Extension of Process in Right Lower Lobe  -CT Scan showed Lytic destruction and compression of the T7 vertebral body with surrounding soft tissue, consistent with a pathologic fracture. Minimal involvement of the T6 vertebral body. -MRI shows Abnormal signal and contrast enhancement of the T6 and T7 vertebral bodies with associated T7 compression fracture. This is likely secondary to direct extension of the process within the right lower lobe. While  this may be extension of a pulmonary malignancy invading the  vertebral column, an infectious process of the lung with an associated discitis-osteomyelitis is also a possibility. -Discussed Case with Marcus [name] of Interventional Radiology and they will take a Biopsy of it Tomorrow as well as a Paraspinal Biopsy and will send it for Cytology, Pathology, and Culture (fungal, aerobic, and Tb).  -Radiation Oncology Consulted and spoke with Marcus [name], PAC and Appreciate Recc's; Patient to under go 10 Fractions of Radiotherapy over the next 2 weeks.   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 Because Patient has a Back Stimulator, MRI was not previously able to be done however risks outweighed benefits and proceeded with MRI.  -Neurosurgery evaluated the MRI and noted that profound high signal and swelling of the spinal cord consistent with spinal cord infarction. Although there was some stenosis and compression there was no focal lesion for which surgical intervention will improve his situation. And believes believe the patient has a fixed deficit of his spinal cord which will not recover despite any additional Interventions -Raadiation Oncology Consulted and spoke with Marcus [name], PAC who discussed the case with her Attending Dr. Tammi [name] and  Appreciate Recc's; Rad/Onc feels like the patient should begin simulation and Radiation Treatment to see if they can make any meaningful impact on his paralysis. Patient to under go 10 Fractions of Radiotherapy over the next 2 weeks.  -Patient to be transferred to [facility] for Treatment and transferred back to [facility] tonight for anticipated Biopsy tomorrow. He will then be transferred back to [facility] after Biopsy for continued Radiation Treatments.  -PT/OT Eval and Treat -C/w Decadron 10 mg IV q6h  -Appreciated Neurosurgery Consult and Recc's  [name] from possible Osteomyelitis -WBC was 13.1 -> 11.1 -> 6.5, repeat CBC in AM -Unclear Source however possible Discitis/Osteomyelitis of Spine -Will hold of on TEE at this time.  -C/w Cefazolin per ID Recc's for 4 weeks; Will likely need PICC -Will need Repeat Blood Cx's  Atrial Flutter/Atrial Fibrillation with RVR s/p Conversion to NSR -TSH normal -Cardizem gtt stopped and patient transitioned to Diltiazem 60 mg po q6h;  -Cardiology Evaluation and Recc's appreciated -Patient was given Digoxin IV x 3 and will now start Digoxin 0.25 mg daily -Was started on Apixaban and received one dose but will Hold off Anticoagulation for now in case patient undergoes Biopsy or Abscess Drainage in Spine -TTEchocardiogram done but pending read -Will need Anticoagulation once it is known if procedures will be done or not   Abnromal LFT's -AST was 116, and ALT was 50 -Unclear Etiology at this point. Possibly from Hypotension -Patient states he takes a lot of Acetaminophen; Does not Drink  Diabetes Mellitus Type 2.  -Diet controlled. -Continue to Monitor  Hypotension, improved -On Cardizem gtt -C/w  NS at a rate of 100 mL/hr and bolus as appropriate -Continue to Monitor closely  Urinary retention with Hx of BPH and   -Urine Culture showed 20,000 species of Staph Aureus -Patient with a history of urethral stricture.  -Foley placed by  Urology -Strict I's and O's  History of CVA from [facility]  -Has some residual right-sided weakness. On Plavix. -Hold Plavix for now -Carotid Dopplers   DVT prophylaxis: Heparin sq Code Status: Full Family Communication: Discussed in Detail plan of care with Family who is in agreement with current plan. All questions answered to their satisfaction Disposition Plan: Transfer to [facility] for Radiation Treatments and then transfer back to [facility] for Anticipated Biopsy in Am.   Consultants:   Cardiology  Urology  Interventional Radiology  Infectious Diseases  Neurosurgery  Radiation Oncology  Procedures: Radiation Treatments  Antimicrobials: Cefazolin  Subjective: Seen and examined this AM and he stated he was feeling better. Talked with him and told him he was going to get a CXR. Still complained of severe back pain. Stated he had hiccups that would not cease. Received a Call from Worthy Flank, PA-C and discussed case with her and it was felt that he may benefit from Radiation Tx for his Paralysis so it was decided that the patient should be transferred to [facility] to start Treatments and transferred back tonight for Biopsy is done tomorrow. Once Biopsy is done patient will be transferred back to [facility] for remainder of Radiation treatments. Patient and family understood and agreed plan of care.   Objective: Vitals:   12/17/15 0946 12/17/15 0954 12/17/15 1100 12/17/15 1252  BP:   114/69 114/69  Pulse: 74   62  Resp: 20     Temp:   97.8 F (36.6 C) 97.8 F (36.6 C)  TempSrc:    Oral  SpO2: 96% 96% 92% 92%  Weight:      Height:        Intake/Output Summary (Last 24 hours) at 12/17/15 1356 Last data filed at 12/17/15 0705  Gross per 24 hour  Intake          2506.67 ml  Output             1900 ml  Net           606.67 ml   Filed Weights   12/15/15 0554 12/16/15 0545 12/17/15 0702  Weight: 94.6 kg (208 lb 9.6 oz) 93.4 kg (205 lb 12.8  oz) 94.4 kg (208 lb 1.6 oz)    Examination: Physical Exam:  Constitutional: Chronic Ill appearing 75 year old male who appears uncompfortable Eyes: Lids and conjunctivae normal, sclerae anicteric  ENMT: External Ears, Nose appear normal. Grossly normal hearing.  Neck: Appears normal, supple, no cervical masses, normal ROM, no appreciable thyromegaly, no JVD Respiratory: Diminished bilaterally, no wheezing, rales, rhonchi or crackles. Normal respiratory effort and patient is not tachypenic. No accessory muscle use.  Cardiovascular: RRR, no murmurs / rubs / gallops. S1 and S2 auscultated. No extremity edema. Abdomen: Soft, non-tender, non-distended. No masses palpated. No appreciable hepatosplenomegaly. Bowel sounds positive.  GU: Deferred. Musculoskeletal: No clubbing / cyanosis of digits/nails. No joint deformity upper and lower extremities. No contractures. Decreased strength and muscle tone in LE with 0/5 in Strength.   Skin: No rashes, lesions, ulcers. No induration; Warm and dry.  Neurologic: CN 2-12 grossly intact with no focal deficits. Sensation decreased especially from Right Leg to Right Nipple line and Left Leg  up until Thigh, Romberg sign cerebellar reflexes not assessed.  Psychiatric: Normal judgment and insight. Alert and oriented x 3. Improved mood and affect.   Data Reviewed: I have personally reviewed following labs and imaging studies  CBC:  Recent Labs Lab 12/14/15 0635 12/15/15 0351 12/16/15 0432 12/17/15 0337  WBC 15.9* 13.1* 11.1* 6.5  NEUTROABS  --   --  9.4* 5.9  HGB 11.7* 10.5* 10.6* 11.4*  HCT 35.7* 32.7* 33.0* 35.4*  MCV 93.2 93.7 93.5 93.4  PLT 291 275 286 [id number]   Basic Metabolic Panel:  Recent Labs Lab 12/14/15 0635 12/15/15 0351 12/16/15 0432 12/17/15 0337  NA 132* 132* 134* 134*  K 3.6 3.6 3.5 4.2  CL 93* 96* 99* 102  CO2 29 28 27 24   GLUCOSE 124* 110* 99 193*  BUN 12 16 22* 27*  CREATININE 0.79 0.86 0.89 0.80  CALCIUM 8.6* 8.6* 8.5* 8.4*   MG  --   --  2.1 2.2  PHOS  --   --  3.2 3.0   GFR: Estimated Creatinine Clearance: 92.2 mL/min (by C-G formula based on SCr of 0.8 mg/dL). Liver Function Tests:  Recent Labs Lab 12/14/15 0635 12/16/15 0432 12/17/15 0337  AST 44* 87* 116*  ALT 28 45 50  ALKPHOS 183* 146* 138*  BILITOT 0.7 0.5 0.3  PROT 7.9 6.0* 5.9*  ALBUMIN 1.9* 1.5* 1.4*   No results for input(s): LIPASE, AMYLASE in the last 168 hours. No results for input(s): AMMONIA in the last 168 hours. Coagulation Profile:  Recent Labs Lab 12/14/15 1721  INR 1.04   Cardiac Enzymes: No results for input(s): CKTOTAL, CKMB, CKMBINDEX, TROPONINI in the last 168 hours. BNP (last 3 results) No results for input(s): PROBNP in the last 8760 hours. HbA1C: No results for input(s): HGBA1C in the last 72 hours. CBG:  Recent Labs Lab 12/14/15 0638  GLUCAP 138*   Lipid Profile:  Recent Labs  12/15/15 0351  CHOL 52  HDL 10*  LDLCALC 30  TRIG 61  CHOLHDL 5.2   Thyroid Function Tests:  Recent Labs  12/14/15 1707  TSH 0.469   Anemia Panel: No results for input(s): VITAMINB12, FOLATE, FERRITIN, TIBC, IRON, RETICCTPCT in the last 72 hours. Sepsis Labs:  Recent Labs Lab 12/14/15 0906 12/14/15 1151 12/17/15 0337  LATICACIDVEN 1.07 1.54 1.2    Recent Results (from the past 240 hour(s))  Blood culture (routine x 2)     Status: Abnormal (Preliminary result)   Collection Time: 12/14/15  8:50 AM  Result Value Ref Range Status   Specimen Description BLOOD LEFT ARM  Final   Special Requests BOTTLES DRAWN AEROBIC AND ANAEROBIC 10CC  Final   Culture  Setup Time   Final    IN BOTH AEROBIC AND ANAEROBIC BOTTLES GRAM POSITIVE COCCI IN CLUSTERS CRITICAL RESULT CALLED TO, READ BACK BY AND VERIFIED WITH: TO TO SGULLETT(PHARD) BY MCAMPBELL 12/15/2015 AT 5:55AM    Culture (A)  Final    STAPHYLOCOCCUS AUREUS SUSCEPTIBILITIES TO FOLLOW    Report Status PENDING  Incomplete  Blood culture (routine x 2)     Status:  Abnormal (Preliminary result)   Collection Time: 12/14/15  9:00 AM  Result Value Ref Range Status   Specimen Description BLOOD RIGHT HAND  Final   Special Requests BOTTLES DRAWN AEROBIC AND ANAEROBIC 5CC  Final   Culture  Setup Time   Final    IN BOTH AEROBIC AND ANAEROBIC BOTTLES GRAM POSITIVE COCCI IN CLUSTERS Organism ID to follow CRITICAL RESULT  CALLED TO, READ BACK BY AND VERIFIED WITH: TO TO SGULLETT(PHARD) BY MCAMPBELL 12/15/2015 AT 5:55AM    Culture (A)  Final    STAPHYLOCOCCUS AUREUS SUSCEPTIBILITIES TO FOLLOW    Report Status PENDING  Incomplete  Blood Culture ID Panel (Reflexed)     Status: Abnormal   Collection Time: 12/14/15  9:00 AM  Result Value Ref Range Status   Enterococcus species NOT DETECTED NOT DETECTED Final   Vancomycin resistance NOT DETECTED NOT DETECTED Final   Listeria monocytogenes NOT DETECTED NOT DETECTED Final   Staphylococcus species DETECTED (A) NOT DETECTED Final    Comment: CRITICAL RESULT CALLED TO, READ BACK BY AND VERIFIED WITH: TO SGULLETT(PHARD) BY MCAMPBELL 12/15/2015 AT 5:55AM    Staphylococcus aureus DETECTED (A) NOT DETECTED Final    Comment: CRITICAL RESULT CALLED TO, READ BACK BY AND VERIFIED WITH: TO SGULLETT(PHARD) BY MCAMPBELL 12/15/2015 AT 5:55AM    Methicillin resistance NOT DETECTED NOT DETECTED Final   Streptococcus species NOT DETECTED NOT DETECTED Final   Streptococcus agalactiae NOT DETECTED NOT DETECTED Final   Streptococcus pneumoniae NOT DETECTED NOT DETECTED Final   Streptococcus pyogenes NOT DETECTED NOT DETECTED Final   Acinetobacter baumannii NOT DETECTED NOT DETECTED Final   Enterobacteriaceae species NOT DETECTED NOT DETECTED Final   Enterobacter cloacae complex NOT DETECTED NOT DETECTED Final   Escherichia coli NOT DETECTED NOT DETECTED Final   Klebsiella oxytoca NOT DETECTED NOT DETECTED Final   Klebsiella pneumoniae NOT DETECTED NOT DETECTED Final   Proteus species NOT DETECTED NOT DETECTED Final   Serratia  marcescens NOT DETECTED NOT DETECTED Final   Carbapenem resistance NOT DETECTED NOT DETECTED Final   Haemophilus influenzae NOT DETECTED NOT DETECTED Final   Neisseria meningitidis NOT DETECTED NOT DETECTED Final   Pseudomonas aeruginosa NOT DETECTED NOT DETECTED Final   Candida albicans NOT DETECTED NOT DETECTED Final   Candida glabrata NOT DETECTED NOT DETECTED Final   Candida krusei NOT DETECTED NOT DETECTED Final   Candida parapsilosis NOT DETECTED NOT DETECTED Final   Candida tropicalis NOT DETECTED NOT DETECTED Final  Urine culture     Status: Abnormal   Collection Time: 12/14/15  4:22 PM  Result Value Ref Range Status   Specimen Description URINE, RANDOM  Final   Special Requests NONE  Final   Culture 20,000 COLONIES/mL STAPHYLOCOCCUS AUREUS (A)  Final   Report Status 12/17/2015 FINAL  Final   Organism ID, Bacteria STAPHYLOCOCCUS AUREUS (A)  Final      Susceptibility   Staphylococcus aureus - MIC*    CIPROFLOXACIN <=0.5 SENSITIVE Sensitive     GENTAMICIN <=0.5 SENSITIVE Sensitive     NITROFURANTOIN <=16 SENSITIVE Sensitive     OXACILLIN <=0.25 SENSITIVE Sensitive     TETRACYCLINE <=1 SENSITIVE Sensitive     VANCOMYCIN 1 SENSITIVE Sensitive     TRIMETH/SULFA <=10 SENSITIVE Sensitive     CLINDAMYCIN <=0.25 SENSITIVE Sensitive     RIFAMPIN <=0.5 SENSITIVE Sensitive     Inducible Clindamycin NEGATIVE Sensitive     * 20,000 COLONIES/mL STAPHYLOCOCCUS AUREUS  MRSA PCR Screening     Status: None   Collection Time: 12/14/15 11:57 PM  Result Value Ref Range Status   MRSA by PCR NEGATIVE NEGATIVE Final    Comment:        The GeneXpert MRSA Assay (FDA approved for NASAL specimens only), is one component of a comprehensive MRSA colonization surveillance program. It is not intended to diagnose MRSA infection nor to guide or monitor treatment for MRSA infections.  Radiology Studies: Dg Chest 2 View  Result Date: 12/17/2015 CLINICAL DATA:  Shortness of breath, left-sided  chest pain EXAM: CHEST  2 VIEW COMPARISON:  CT chest of 12/14/2015, chest x-ray of 12/14/2015, and two-view chest x-ray of 11/12/2015 FINDINGS: There is abnormal opacity overlying the right hilum and suprahilar region consistent with the soft tissue mass described on recent CT of the chest. In view of the rapid development since chest x-ray of 11/12/2015, an infectious or inflammatory process would be more likely than neoplasm. No pneumonia is seen and there is a small right pleural effusion present. The heart is within upper limits normal. Partial compression of T8 and the anterior inferior aspect of T7 is noted which could also either be infectious, inflammatory, or neoplastic in origin. IMPRESSION: 1. Abnormal opacity overlies the right hilum/suprahilar region consistent with the previously described soft tissue mass involving the posterior medial right lower lobe and thoracic vertebral column. Favor infectious or inflammatory process as noted above. 2. Small right pleural effusion. Electronically Signed   By: Ivar Drape M.D.   On: 12/17/2015 11:57   Mr Thoracic Spine W Wo Contrast  Result Date: 12/16/2015 CLINICAL DATA:  Invasive thoracic mass. Bilateral lower extremity anesthesia. EXAM: MRI THORACIC SPINE WITHOUT AND WITH CONTRAST TECHNIQUE: Multiplanar and multiecho pulse sequences of the thoracic spine were obtained without and with intravenous contrast. CONTRAST:  40mL MULTIHANCE GADOBENATE DIMEGLUMINE 529 MG/ML IV SOLN COMPARISON:  Chest CT 12/14/2015 FINDINGS: There is loss of the normal T1 weighted signal within the T6 and T7 vertebral bodies with associated contrast enhancement. This is in continuity with a posterior right chest wall mass that is better characterized on the recent chest CT. On the current examination, this mass measures approximately 4.4 x 4.1 cm. There is a peripherally enhancing collection along the dorsal aspect of the spinal canal extending from the level of the T5-T6 disc to  the T9 level, measuring up to 5 mm in thickness. This contributes to severe attenuation of the thecal sac and compression of the spinal cord. There is increased T2 weighted signal within the spinal cord at the T7 and T8 levels suggesting compressive myelopathy. Additionally, there is compression deformity of the T7 body with approximately 50% height loss centrally and mild retropulsion. The other thoracic levels are unremarkable without spinal canal stenosis. IMPRESSION: 1. Abnormal signal and contrast enhancement of the T6 and T7 vertebral bodies with associated T7 compression fracture. This is likely secondary to direct extension of the process within the right lower lobe. While this may be extension of a pulmonary malignancy invading the vertebral column, an infectious process of the lung with an associated discitis-osteomyelitis is also a possibility. Given this constellation of findings, atypical infectious processes (such as tuberculosis) should be considered. 2. 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 3. 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 Critical Value/emergent results were called by telephone at the time of interpretation on 12/16/2015 at 6:53 pm to Dr. Raiford Noble , who verbally acknowledged these results. Electronically Signed   By: Ulyses Jarred M.D.   On: 12/16/2015 18:54   Scheduled Meds: .  ceFAZolin (ANCEF) IV  2 g Intravenous Q8H  . dexamethasone  10 mg Intravenous Q6H  . digoxin  0.25 mg Oral Daily  .  diltiazem  60 mg Oral Q6H  . heparin subcutaneous  5,000 Units Subcutaneous Q8H  . [START ON 12/18/2015] heparin subcutaneous  5,000 Units Subcutaneous Q8H  . LORazepam  0.5 mg Oral Daily  . sodium chloride HYPERTONIC  4 mL  Nebulization Daily  . timolol  1 drop Both Eyes BID   Continuous Infusions: . sodium chloride 100 mL/hr at 12/16/15 1234    LOS: 3 days   Kerney Elbe, DO [facility] Pager [phone]  If 7PM-7AM, please contact night-coverage [email or web address] Password [email or web address] 12/17/2015, 1:56 PM

## 2015-12-17 NOTE — Progress Notes (Signed)
RN turned IV Cardizem off about 15-20 minutes ago due to patient heart rate down into the 40's.  RN into administer PO Cardizem per order and heart rate back up 120-140's in atrial flutter.  Cardiology page to updated.  Dr. Aundra Dubin was updated, order received and entered per RN.

## 2015-12-17 NOTE — Progress Notes (Signed)
Received patient from sim. Patient reports thoracic spine pain 7 on a scale of 0-10. Administered morphine 2 mg IV per Dr. Johny Shears order.

## 2015-12-17 NOTE — Progress Notes (Signed)
Per Shona Simpson, PA-C order arranged transportation via [facility] for patient from [facility] to radiation oncology for mark and start to manage cord compression. Spoke with Merrily Pew, RN caring for patient today. He confirms the patient is NOT under any isolation/precautions. Reports the patient is alert but, confused at time. Requested family member accompany patient for consenting purposes. Josh, RN reports he administered po oxycodone at 1115. Also, Josh reports the patient was in afib with RVR but is now stable and on po medication for management. Raelyn Mora, RT in sim of these findings.

## 2015-12-17 NOTE — Progress Notes (Signed)
RN noted patient heart rate down into the 50's intermittently, primarily 60-70's, patient receiving Diltiazem IV at 5mg /hr.  Cardiology paged, Dr. Aundra Dubin returned page and was updated.  Orders received and entered per RN.

## 2015-12-17 NOTE — Progress Notes (Signed)
Patient Name: Marcus [name] Date of Encounter: 12/17/2015  Primary Cardiologist: Kate Sable (new)  Hospital Problem List     Principal Problem:   Atrial fibrillation with rapid ventricular response [facility]) Active Problems:   Essential hypertension   Carotid stenosis   Diabetes ([place])   HLD (hyperlipidemia)   CVA (cerebral vascular accident) ([place])   Fall   CAP (community acquired pneumonia)   Compression fracture of vertebra ([place])   Urinary retention   New onset a-fib ([place])   Right lower lobe lung mass   Lower extremity weakness   Bacteremia   Closed fracture of seventh thoracic vertebra (HCC)   Decreased sensation    Subjective   Feeling well this AM.  Denies chest pain or shortness of breath. Feels better in sinus rhythm.  Inpatient Medications    Scheduled Meds: .  ceFAZolin (ANCEF) IV  2 g Intravenous Q8H  . dexamethasone  10 mg Intravenous Q6H  . heparin subcutaneous  5,000 Units Subcutaneous Q8H  . ipratropium-albuterol  3 mL Nebulization TID  . LORazepam  0.5 mg Oral Daily  . sodium chloride HYPERTONIC  4 mL Nebulization Daily  . timolol  1 drop Both Eyes BID   Continuous Infusions: . sodium chloride 100 mL/hr at 12/16/15 1234  . diltiazem (CARDIZEM) infusion 5 mg/hr (12/17/15 0744)   PRN Meds: acetaminophen, acetaminophen, guaiFENesin-dextromethorphan, HYDROcodone-acetaminophen, ipratropium-albuterol, LORazepam, morphine injection, ondansetron (ZOFRAN) IV   Vital Signs    Vitals:   12/17/15 0333 12/17/15 0520 12/17/15 0702 12/17/15 0754  BP: 92/64 114/66  97/63  Pulse:  77  73  Resp:  19    Temp:  97.5 F (36.4 C)  98.3 F (36.8 C)  TempSrc:  Oral  Oral  SpO2:  94%  92%  Weight:   94.4 kg (208 lb 1.6 oz)   Height:        Intake/Output Summary (Last 24 hours) at 12/17/15 0835 Last data filed at 12/17/15 0705  Gross per 24 hour  Intake          2506.67 ml  Output             1900 ml  Net           606.67 ml   Filed Weights   12/15/15 0554 12/16/15 0545 12/17/15 0702  Weight: 94.6 kg (208 lb 9.6 oz) 93.4 kg (205 lb 12.8 oz) 94.4 kg (208 lb 1.6 oz)    Physical Exam   GEN: Well nourished, well developed, in no acute distress.  HEENT: Grossly normal.  Neck: Supple, no JVD, carotid bruits, or masses. Cardiac: RRR, no murmurs, rubs, or gallops. No clubbing, cyanosis, edema.  Radials/DP/PT 2+ and equal bilaterally.  Respiratory:  Respirations regular and unlabored, clear to auscultation bilaterally. GI: Soft, nontender, nondistended, BS + x 4. MS: no deformity or atrophy. Skin: warm and dry, no rash. Neuro:  Unable to move bilateral LEs. Psych: AAOx3.  Pleas put in for ant affect.  Labs    CBC  Recent Labs  12/16/15 0432 12/17/15 0337  WBC 11.1* 6.5  NEUTROABS 9.4* 5.9  HGB 10.6* 11.4*  HCT 33.0* 35.4*  MCV 93.5 93.4  PLT 286 [id number]   Basic Metabolic Panel  Recent Labs  12/16/15 0432 12/17/15 0337  NA 134* 134*  K 3.5 4.2  CL 99* 102  CO2 27 24  GLUCOSE 99 193*  BUN 22* 27*  CREATININE 0.89 0.80  CALCIUM 8.5* 8.4*  MG 2.1 2.2  PHOS 3.2 3.0  Liver Function Tests  Recent Labs  12/16/15 0432 12/17/15 0337  AST 87* 116*  ALT 45 50  ALKPHOS 146* 138*  BILITOT 0.5 0.3  PROT 6.0* 5.9*  ALBUMIN 1.5* 1.4*   No results for input(s): LIPASE, AMYLASE in the last 72 hours. Cardiac Enzymes No results for input(s): CKTOTAL, CKMB, CKMBINDEX, TROPONINI in the last 72 hours. BNP Invalid input(s): POCBNP D-Dimer No results for input(s): DDIMER in the last 72 hours. Hemoglobin A1C No results for input(s): HGBA1C in the last 72 hours. Fasting Lipid Panel  Recent Labs  12/15/15 0351  CHOL 52  HDL 10*  LDLCALC 30  TRIG 61  CHOLHDL 5.2   Thyroid Function Tests  Recent Labs  12/14/15 1707  TSH 0.469    Telemetry    Sinus rhythm switched back to atrial fibrillation/flutter at 12:45 am - Personally Reviewed  ECG    12/15/15: atrial flutter with variable AV conduction. Rate 151  bpm - Personally Reviewed  Radiology    Mr Thoracic Spine W Wo Contrast  Result Date: 12/16/2015 CLINICAL DATA:  Invasive thoracic mass. Bilateral lower extremity anesthesia. EXAM: MRI THORACIC SPINE WITHOUT AND WITH CONTRAST TECHNIQUE: Multiplanar and multiecho pulse sequences of the thoracic spine were obtained without and with intravenous contrast. CONTRAST:  70mL MULTIHANCE GADOBENATE DIMEGLUMINE 529 MG/ML IV SOLN COMPARISON:  Chest CT 12/14/2015 FINDINGS: There is loss of the normal T1 weighted signal within the T6 and T7 vertebral bodies with associated contrast enhancement. This is in continuity with a posterior right chest wall mass that is better characterized on the recent chest CT. On the current examination, this mass measures approximately 4.4 x 4.1 cm. There is a peripherally enhancing collection along the dorsal aspect of the spinal canal extending from the level of the T5-T6 disc to the T9 level, measuring up to 5 mm in thickness. This contributes to severe attenuation of the thecal sac and compression of the spinal cord. There is increased T2 weighted signal within the spinal cord at the T7 and T8 levels suggesting compressive myelopathy. Additionally, there is compression deformity of the T7 body with approximately 50% height loss centrally and mild retropulsion. The other thoracic levels are unremarkable without spinal canal stenosis. IMPRESSION: 1. Abnormal signal and contrast enhancement of the T6 and T7 vertebral bodies with associated T7 compression fracture. This is likely secondary to direct extension of the process within the right lower lobe. While this may be extension of a pulmonary malignancy invading the vertebral column, an infectious process of the lung with an associated discitis-osteomyelitis is also a possibility. Given this constellation of findings, atypical infectious processes (such as tuberculosis) should be considered. 2. Peripherally enhancing collection along the  dorsal aspect of the spinal canal is most consistent with epidural abscess, measuring up to 5 mm in thickness. 3. 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 Critical Value/emergent results were called by telephone at the time of interpretation on 12/16/2015 at 6:53 pm to Dr. Raiford Noble , who verbally acknowledged these results. Electronically Signed   By: Ulyses Jarred M.D.   On: 12/16/2015 18:54    Cardiac Studies   Echo 06/02/14: Study Conclusions  - Procedure narrative: Transthoracic echocardiography. Image quality was poor. The study was technically difficult, as a result of poor acoustic windows. - Left ventricle: The cavity size was normal. There was mild concentric hypertrophy. Systolic function  was normal. The estimated ejection fraction was in the range of 60% to 65%. Images were inadequate for LV wall motion assessment. Doppler parameters are consistent with abnormal left ventricular relaxation (grade 1 diastolic dysfunction). - Mitral valve: Mildly calcified annulus. Normal thickness leaflets . - Left atrium: The atrium was mildly dilated. - Right atrium: The atrium was mildly dilated.  Patient Profile     Mr. Grothaus is  A 49M with hypertension, diabetes, TIA on clopidogrel, s/p CEA, and GERD here with several days of worsening malaise/fatigue and a recent fall who then developed Afib with RVR in the ED. He subsequently converted to NSR following uptitration of a diltiazem gtt and additional bolus dosing of metoprolol but is now back in atrial flutter.  Hospitalization also complicated by closed fracture of 7 thoracic vertebrae, R lower lobe lung mass, urinary retention and [name].    Assessment & Plan    # Atrial fibrillation with RVR: Mr. Maratea has been in and out of atrial fibrillation with  poorly-controlled rates despite digoxin.  Rates are poorly-controlled when in atrial fibrillation.  He was started on Eliquis and received one dose, but this was stopped as he may need biopsy of spine lesion.  We will transition to diltiazem 60mg  q6h and stop the infusion.  Start digoxin 0.34m daily.  Ideally he would not be on an antiarrhythmic until he can be on uninterrupted anticoagulation.  However, if he continues to have poorly-controlled paroxysmal AF, may have to try amiodarone.  Echo still pending.  # Carotid stenosis s/p CEA:  Carotid Dopplers pending. LDL is 30 so no statin.  # Prior TIA: Holding home plavix for biopsy.  Signed, Skeet Latch, MD  12/17/2015, 8:35 AM

## 2015-12-18 ENCOUNTER — Inpatient Hospital Stay (HOSPITAL_COMMUNITY): Payer: Medicare Other

## 2015-12-18 LAB — COMPREHENSIVE METABOLIC PANEL
ALT: 33 U/L (ref 17–63)
AST: 61 U/L — ABNORMAL HIGH (ref 15–41)
Albumin: 1.5 g/dL — ABNORMAL LOW (ref 3.5–5.0)
Alkaline Phosphatase: 116 U/L (ref 38–126)
Anion gap: 8 (ref 5–15)
BILIRUBIN TOTAL: 0.2 mg/dL — AB (ref 0.3–1.2)
BUN: 27 mg/dL — ABNORMAL HIGH (ref 6–20)
CHLORIDE: 101 mmol/L (ref 101–111)
CO2: 25 mmol/L (ref 22–32)
Calcium: 8.1 mg/dL — ABNORMAL LOW (ref 8.9–10.3)
Creatinine, Ser: 0.84 mg/dL (ref 0.61–1.24)
Glucose, Bld: 148 mg/dL — ABNORMAL HIGH (ref 65–99)
POTASSIUM: 4.1 mmol/L (ref 3.5–5.1)
Sodium: 134 mmol/L — ABNORMAL LOW (ref 135–145)
TOTAL PROTEIN: 5.7 g/dL — AB (ref 6.5–8.1)

## 2015-12-18 LAB — EXPECTORATED SPUTUM ASSESSMENT W REFEX TO RESP CULTURE

## 2015-12-18 LAB — CBC WITH DIFFERENTIAL/PLATELET
Basophils Absolute: 0 10*3/uL (ref 0.0–0.1)
Basophils Relative: 0 %
EOS PCT: 0 %
Eosinophils Absolute: 0 10*3/uL (ref 0.0–0.7)
HEMATOCRIT: 32 % — AB (ref 39.0–52.0)
Hemoglobin: 10.3 g/dL — ABNORMAL LOW (ref 13.0–17.0)
LYMPHS ABS: 0.6 10*3/uL — AB (ref 0.7–4.0)
LYMPHS PCT: 6 %
MCH: 29.8 pg (ref 26.0–34.0)
MCHC: 32.2 g/dL (ref 30.0–36.0)
MCV: 92.5 fL (ref 78.0–100.0)
MONO ABS: 0.2 10*3/uL (ref 0.1–1.0)
MONOS PCT: 3 %
Neutro Abs: 8.4 10*3/uL — ABNORMAL HIGH (ref 1.7–7.7)
Neutrophils Relative %: 91 %
PLATELETS: 376 10*3/uL (ref 150–400)
RBC: 3.46 MIL/uL — ABNORMAL LOW (ref 4.22–5.81)
RDW: 14.1 % (ref 11.5–15.5)
WBC: 9.2 10*3/uL (ref 4.0–10.5)

## 2015-12-18 LAB — EXPECTORATED SPUTUM ASSESSMENT W GRAM STAIN, RFLX TO RESP C

## 2015-12-18 LAB — MAGNESIUM: MAGNESIUM: 2.2 mg/dL (ref 1.7–2.4)

## 2015-12-18 LAB — PHOSPHORUS: PHOSPHORUS: 3.3 mg/dL (ref 2.5–4.6)

## 2015-12-18 MED ORDER — LIDOCAINE HCL 1 % IJ SOLN
INTRAMUSCULAR | Status: AC
  Filled 2015-12-18: qty 20

## 2015-12-18 MED ORDER — POLYETHYLENE GLYCOL 3350 17 G PO PACK
17.0000 g | PACK | Freq: Every day | ORAL | Status: DC
  Administered 2015-12-18 – 2015-12-22 (×4): 17 g via ORAL
  Filled 2015-12-18 (×5): qty 1

## 2015-12-18 MED ORDER — MIDAZOLAM HCL 2 MG/2ML IJ SOLN
INTRAMUSCULAR | Status: AC | PRN
  Administered 2015-12-18: 2 mg via INTRAVENOUS

## 2015-12-18 MED ORDER — MIDAZOLAM HCL 2 MG/2ML IJ SOLN
INTRAMUSCULAR | Status: AC
  Filled 2015-12-18: qty 2

## 2015-12-18 MED ORDER — FENTANYL CITRATE (PF) 100 MCG/2ML IJ SOLN
INTRAMUSCULAR | Status: AC
  Filled 2015-12-18: qty 2

## 2015-12-18 MED ORDER — FENTANYL CITRATE (PF) 100 MCG/2ML IJ SOLN
INTRAMUSCULAR | Status: AC | PRN
  Administered 2015-12-18: 100 ug via INTRAVENOUS

## 2015-12-18 MED ORDER — SENNOSIDES-DOCUSATE SODIUM 8.6-50 MG PO TABS
1.0000 | ORAL_TABLET | Freq: Two times a day (BID) | ORAL | Status: DC
  Administered 2015-12-18 – 2015-12-23 (×11): 1 via ORAL
  Filled 2015-12-18 (×12): qty 1

## 2015-12-18 MED ORDER — DILTIAZEM HCL ER COATED BEADS 180 MG PO CP24
180.0000 mg | ORAL_CAPSULE | Freq: Every day | ORAL | Status: DC
  Administered 2015-12-19 – 2015-12-24 (×6): 180 mg via ORAL
  Filled 2015-12-18 (×7): qty 1

## 2015-12-18 NOTE — Sedation Documentation (Signed)
Patient is resting comfortably. 

## 2015-12-18 NOTE — Progress Notes (Signed)
Inpatient Rehabilitation  PT is recommending IP Rehab.  At this time, we are recommending an IP rehab consult.  Please order if you are agreeable.    [facility] Admissions Coordinator Cell [phone] Office [phone]

## 2015-12-18 NOTE — Evaluation (Signed)
Physical Therapy Evaluation Patient Details Name: Marcus [name] MRN: [id number] DOB: June 11, 1940 Today's Date: 12/18/2015   History of Present Illness  75 y.o. male admitted to [facility] on 12/14/15 for LE weakness after fall, pain.  Initial eval revealed CAP, L lower lobe mass (bx on 12/18/15), and closed fx of T7 vertebrae (resultant weakness and loss of sensation from T7 down), urinary retention, A-fib with RVR.  Neurosurgeon consulted and pt not thought to be a surgical candidate.  Pt with significant PMhx of urethral stricture, DM, TIA, stroke, low back pain, essential tremor, anxiety, and spinal fusion surgery.  Clinical Impression  Pt with significant impairments in overall mobility due to decreased sensation and 0/5 strength (essentially paraplegic) from T7 down.  Pt with good upper extremity strength and some core activation that gives him a good change to be mobile at a WC level.  Pt has a very supportive family and a great home set up and is motivated to get home. He is open to the idea of intensive therapy to both get him more independent and give his caregivers some needed education on how to care for him, so I have recommended an inpatient rehab screen.   PT to follow acutely for deficits listed below.       Follow Up Recommendations CIR    Equipment Recommendations  Wheelchair (measurements PT);Wheelchair cushion (measurements PT);3in1 (PT);Other (comment) (drop arm 3-in-1)    Recommendations for Other Services Rehab consult     Precautions / Restrictions Precautions Precautions: Fall Precaution Comments: h/o fall, now with inability to move legs and poor trunk control      Mobility  Bed Mobility Overal bed mobility: +2 for physical assistance;Needs Assistance Bed Mobility: Rolling;Sidelying to Sit;Sit to Sidelying Rolling: +2 for physical assistance;Max assist Sidelying to sit: +2 for physical assistance;Max assist     Sit to sidelying: +2 for physical assistance;Max  assist General bed mobility comments: Two person max assist to roll to his left side, manually assisting pt to reach with his arms to the bed rail to help progress his trunk to sidelying.  right knee manually flexed and trunk and pelvis assisted into sidelying.  He was helping with his uppers.  Side lying to sit max assist to progress bil legs over EOB and help support trunk while pt attempted to push to sitting EOB with bil arms on bed rail.  Immediate fear reaction with transition to sit.  Very afraid to fall.          Balance Overall balance assessment: Needs assistance Sitting-balance support: Feet supported;Bilateral upper extremity supported Sitting balance-Leahy Scale: Poor Sitting balance - Comments: Pt was able to get up to close supervision for short periods of time once positioned EOB.  We worked on both sitting balance (him supporting himself with both arms) and ability to remove one hand to breifly reach to touch something.  Poor to fair sitting balance.  Postural control: Posterior lean                                   Pertinent Vitals/Pain Pain Assessment: Faces Faces Pain Scale: Hurts whole lot Pain Location: back and sides with transitional movements, he does not like being flat.  Pain Descriptors / Indicators: Aching;Burning;Grimacing;Guarding Pain Intervention(s): Limited activity within patient's tolerance;Monitored during session;Repositioned    Home Living Family/patient expects to be discharged to:: Private residence Living Arrangements: Spouse/significant other Available Help at Discharge: Family;Available  24 hours/day Type of Home: House Home Access: Level entry     Home Layout: One level Home Equipment: Walker - 2 wheels      Prior Function Level of Independence: Independent         Comments: works full time as a used Horticulturist, commercial   Dominant Hand: Right    Extremity/Trunk Assessment   Upper Extremity  Assessment: RUE deficits/detail;LUE deficits/detail RUE Deficits / Details: Mostly 4/5 throughout with painful overhead movements making overhead 4-/5 due to pain.  Grip equal bil, sensation intact.      LUE Deficits / Details: Mostly 4/5 throughout with painful overhead movements making overhead 4-/5 due to pain.  Grip equal bil, sensation intact.    Lower Extremity Assessment: RLE deficits/detail;LLE deficits/detail RLE Deficits / Details: no sensation below T7 not even to painful stimuli, some reflexive-type movements, but otherwise 0/5 ankle, knee and hips bil LLE Deficits / Details: no sensation below T7 not even to painful stimuli, some reflexive-type movements, but otherwise 0/5 ankle, knee and hips bil  Cervical / Trunk Assessment: Other exceptions  Communication   Communication: No difficulties  Cognition Arousal/Alertness: Awake/alert Behavior During Therapy: WFL for tasks assessed/performed Overall Cognitive Status: Impaired/Different from baseline Area of Impairment: Memory;Orientation Orientation Level: Time   Memory: Decreased short-term memory         General Comments: Cognitive deficits are likely due to sedatives and pain meds from bx earlier today.  Daughters report normally his cognition is fine.     General Comments General comments (skin integrity, edema, etc.): O2 sats decreased to 85% seated EOB with increased DOE to 2/4.  Pt was trying to do something physical and talk.  If he closed his mouth and preformed pursed lip breathing for <1 min it returned to 97% without supplemental O2.     Exercises General Exercises - Lower Extremity Ankle Circles/Pumps: PROM;Both;10 reps Heel Slides: PROM;Both;10 reps Hip ABduction/ADduction: PROM;Both;10 reps   Assessment/Plan    PT Assessment Patient needs continued PT services  PT Problem List Decreased strength;Decreased activity tolerance;Decreased balance;Decreased mobility;Decreased range of motion;Decreased  coordination;Decreased knowledge of use of DME;Decreased knowledge of precautions;Cardiopulmonary status limiting activity;Pain;Impaired sensation;Impaired tone          PT Treatment Interventions DME instruction;Stair training;Functional mobility training;Therapeutic activities;Therapeutic exercise;Balance training;Patient/family education;Neuromuscular re-education;Wheelchair mobility training;Manual techniques    PT Goals (Current goals can be found in the Care Plan section)  Acute Rehab PT Goals Patient Stated Goal: to go home PT Goal Formulation: With patient/family Time For Goal Achievement: 01/01/16 Potential to Achieve Goals: Good    Frequency Min 5X/week        Co-evaluation PT/OT/SLP Co-Evaluation/Treatment: Yes Reason for Co-Treatment: Complexity of the patient's impairments (multi-system involvement);For patient/therapist safety PT goals addressed during session: Mobility/safety with mobility;Balance;Strengthening/ROM         End of Session   Activity Tolerance: Patient limited by pain Patient left: in bed;with call bell/phone within reach;with family/visitor present Nurse Communication: Mobility status         Time: [id number] PT Time Calculation (min) (ACUTE ONLY): 51 min   Charges:   PT Evaluation $PT Eval Moderate Complexity: 1 Procedure          Shanda Cadotte B. Nanna Ertle, PT, DPT [phone]   12/18/2015, 5:14 PM

## 2015-12-18 NOTE — Evaluation (Signed)
Occupational Therapy Evaluation Patient Details Name: Marcus [name] MRN: [id number] DOB: 11-09-1940 Today's Date: 12/18/2015    History of Present Illness 75 y.o. male admitted to [facility] on 12/14/15 for LE weakness after fall, pain.  Initial eval revealed CAP, L lower lobe mass (bx on 12/18/15), and closed fx of T7 vertebrae (resultant weakness and loss of sensation from T7 down), urinary retention, A-fib with RVR.  Neurosurgeon consulted and pt not thought to be a surgical candidate.  Pt with significant PMhx of urethral stricture, DM, TIA, stroke, low back pain, essential tremor, anxiety, and spinal fusion surgery.   Clinical Impression   Patient presenting with decreased I in self care, balance, functional mobility, sensation, strength, and safety awareness.Pt with significant impairments secondary to decreased sensation and strength from T7 and down.  Patient reports being I PTA and 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 Patient currently functioning at +2 assist for bed mobility and supine <>sit. Pt sitting on EOB with balance ranging from mod A progressing to intermittent times of close supervision.  Patient will benefit from acute OT to increase overall independence and decrease caregiver burden in the areas of ADLs, functional mobility, and safety awareness in order to safely discharge to next venue of care. Family present during evaluation and are very supportive in regards to his care. Pt would be an excellent candidate for CIR.        Follow Up Recommendations  CIR    Equipment Recommendations  Other (comment) (deferred to next venue of care)    Recommendations for Other Services Rehab consult     Precautions / Restrictions Precautions Precautions: Fall Precaution Comments: h/o fall, now with inability to move legs and poor trunk control      Mobility Bed Mobility Overal bed mobility: +2 for physical assistance;Needs Assistance Bed Mobility: Rolling;Sidelying to  Sit;Sit to Sidelying Rolling: +2 for physical assistance;Max assist Sidelying to sit: +2 for physical assistance;Max assist     Sit to sidelying: +2 for physical assistance;Max assist General bed mobility comments: +2 assist to roll to L side and into sitting position on EOB. Pt needing manual assist and cues for proper technique and hand placement.   Transfers        General transfer comment: not attempted secondary to safety    Balance Overall balance assessment: Needs assistance Sitting-balance support: Feet supported;Bilateral upper extremity supported Sitting balance-Leahy Scale: Poor Sitting balance - Comments: Pt was able to get up to close supervision for short periods of time once positioned EOB.  We worked on both sitting balance (him supporting himself with both arms) and ability to remove one hand to breifly reach to touch something.  Poor to fair sitting balance. Pt is very fearful of falling.  Postural control: Posterior lean          ADL Overall ADL's : Needs assistance/impaired      Lower Body Dressing: Total assistance     General ADL Comments: Total A to don footwear. Pt needing +2 assist for pt and therapist safety. Transfers not attempted this session. Pt with increased pain with pt movement.               Pertinent Vitals/Pain Pain Assessment: Faces Faces Pain Scale: Hurts whole lot Pain Location: back and side with transitions. Does not like flat bed. Pain Descriptors / Indicators: Aching;Burning;Grimacing;Guarding Pain Intervention(s): Limited activity within patient's tolerance;Monitored during session;Repositioned     Hand Dominance Right   Extremity/Trunk Assessment Upper Extremity Assessment Upper  Extremity Assessment: RUE deficits/detail;LUE deficits/detail;Generalized weakness RUE Deficits / Details: Mostly 4/5 throughout with painful overhead movements making overhead 4-/5 due to pain.  Grip equal bil, sensation intact.  LUE Deficits /  Details: Mostly 4/5 throughout with painful overhead movements making overhead 4-/5 due to pain.  Grip equal bil, sensation intact.    Lower Extremity Assessment Lower Extremity Assessment: Defer to PT evaluation RLE Deficits / Details: no sensation below T7 not even to painful stimuli, some reflexive-type movements, but otherwise 0/5 ankle, knee and hips bil LLE Deficits / Details: no sensation below T7 not even to painful stimuli, some reflexive-type movements, but otherwise 0/5 ankle, knee and hips bil   Cervical / Trunk Assessment Cervical / Trunk Assessment: Other exceptions Cervical / Trunk Exceptions: pt is weak in his trunk, but I do believe he has some activiation of trunk musculature, because I do not think he could be close supervision with both hands supported without any trunk activiation.    Communication Communication Communication: No difficulties   Cognition Arousal/Alertness: Awake/alert Behavior During Therapy: WFL for tasks assessed/performed Overall Cognitive Status: Impaired/Different from baseline Area of Impairment: Memory;Orientation Orientation Level: Time   Memory: Decreased short-term memory         General Comments: Cognitive deficits are likely due to sedatives and pain meds from bx earlier today.  Daughters report normally his cognition is fine.    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 Problem List: Decreased strength;Decreased knowledge of use of DME or AE;Decreased range of motion;Decreased coordination;Decreased activity tolerance;Decreased cognition;Impaired balance (sitting and/or standing);Decreased safety awareness;Impaired sensation;Pain;Other (comment) (impaired LE functional use)   OT Treatment/Interventions: Self-care/ADL training;Therapeutic exercise;Neuromuscular education;Energy conservation;DME and/or AE instruction;Cognitive remediation/compensation;Therapeutic activities;Balance training;Patient/family education;Visual/perceptual remediation/compensation;Manual therapy    OT Goals(Current goals can be found in the care plan section) Acute Rehab OT Goals Patient Stated Goal: to go home OT Goal Formulation: With patient Time For Goal Achievement: 01/01/16 Potential to Achieve Goals: Fair ADL Goals Pt Will Perform Grooming: with min assist;sitting Pt Will Perform Upper Body Bathing: with min assist;sitting Pt Will Perform Lower Body Bathing: with mod assist Additional ADL Goal #1: Pt will engage in 10 minutes of functional tasks with 2 rest breaks or less. Additional ADL Goal #2: Pt will perform bed rolling L <> R for hygiene with assist of 1 person.   OT Frequency: Min 2X/week   Barriers to D/C: Other (comment)  none known at this time       Co-evaluation PT/OT/SLP Co-Evaluation/Treatment: Yes Reason for Co-Treatment: Complexity of the patient's impairments (multi-system involvement);For patient/therapist safety PT goals addressed during session: Mobility/safety with mobility;Balance;Strengthening/ROM OT goals addressed during session: ADL's and self-care;Other (comment) (functional mobility/balance)      End of Session    Activity Tolerance: Patient limited by fatigue;Patient limited by pain Patient left: in bed;with call  bell/phone within reach;with family/visitor present   Time: [id number] OT Time Calculation (min): 33  min Charges:  OT General Charges $OT Visit: 1 Procedure OT Evaluation $OT Eval High Complexity: 1 Procedure G-Codes:    Phineas Semen, MS, OTR/L 12/18/2015, 5:41 PM

## 2015-12-18 NOTE — Addendum Note (Signed)
Encounter addended by: Heywood Footman, RN on: 12/18/2015  2:31 PM<BR>    Actions taken: Charge Capture section accepted

## 2015-12-18 NOTE — Plan of Care (Signed)
Problem: Education: Goal: Knowledge of [facility] Education information/materials will improve Outcome: Progressing Patient and patient's daughter Kathlyn [place]) aware of plan of care.  RN provided medication education to patient on all medications administered thus far this shift.  Patient stated understanding, hard to determine how much patient is actually retaining due to forgetfulness and confusion.

## 2015-12-18 NOTE — Progress Notes (Signed)
Patient Name: Marcus [name] Date of Encounter: 12/18/2015  Primary Cardiologist: Kate Sable (new)  Hospital Problem List     Principal Problem:   Atrial fibrillation with rapid ventricular response [facility]) Active Problems:   Essential hypertension   Carotid stenosis   Diabetes ([place])   HLD (hyperlipidemia)   CVA (cerebral vascular accident) ([place])   Fall   CAP (community acquired pneumonia)   Compression fracture of vertebra ([place])   Urinary retention   New onset a-fib ([place])   Right lower lobe lung mass   Lower extremity weakness   Bacteremia   T7 vertebral fracture (H[facility])   Decreased sensation   Closed fracture of seventh thoracic vertebra (HCC)     Subjective   Feeling well. No chest pain, sob or palpitations. For biopsy today.   Inpatient Medications    Scheduled Meds: .  ceFAZolin (ANCEF) IV  2 g Intravenous Q8H  . dexamethasone  10 mg Intravenous Q6H  . digoxin  0.25 mg Oral Daily  . diltiazem  60 mg Oral Q6H  . heparin subcutaneous  5,000 Units Subcutaneous Q8H  . LORazepam  0.5 mg Oral Daily  . timolol  1 drop Both Eyes BID   Continuous Infusions: . sodium chloride 100 mL/hr at 12/18/15 0400   PRN Meds: acetaminophen, acetaminophen, guaiFENesin-dextromethorphan, HYDROcodone-acetaminophen, ipratropium-albuterol, LORazepam, morphine injection, ondansetron (ZOFRAN) IV   Vital Signs    Vitals:   12/18/15 0500 12/18/15 0741 12/18/15 0819 12/18/15 0822  BP:  123/76    Pulse:  61    Resp: (!) 22 16    Temp: 97.9 F (36.6 C) 98.2 F (36.8 C)    TempSrc: Oral Oral    SpO2: 93% 95% 92% 92%  Weight:      Height:        Intake/Output Summary (Last 24 hours) at 12/18/15 0824 Last data filed at 12/18/15 0700  Gross per 24 hour  Intake           2587.5 ml  Output             1250 ml  Net           1337.5 ml   Filed Weights   12/15/15 0554 12/16/15 0545 12/17/15 0702  Weight: 208 lb 9.6 oz (94.6 kg) 205 lb 12.8 oz (93.4 kg) 208 lb 1.6 oz (94.4  kg)    Physical Exam    GEN: Well nourished, well developed, in no acute distress currently having nebulizer treatment HEENT: Grossly normal.  Neck: Supple, no JVD, carotid bruits, or masses. Cardiac: RRR, no murmurs, rubs, or gallops. No clubbing, cyanosis, mild BL LE edema.  Radials/DP/PT 2+ and equal bilaterally.  Respiratory:  Respirations regular and unlabored, diffuse wheezing GI: Soft, nontender, nondistended, BS + x 4. MS: no deformity or atrophy. Skin: warm and dry, no rash. Neuro:  Unable to move bilateral LEs. Psych: AAOx3.  Normal affect.  Labs    CBC  Recent Labs  12/17/15 0337 12/18/15 0429  WBC 6.5 9.2  NEUTROABS 5.9 8.4*  HGB 11.4* 10.3*  HCT 35.4* 32.0*  MCV 93.4 92.5  PLT 321 [id number]   Basic Metabolic Panel  Recent Labs  12/17/15 0337 12/18/15 0429  NA 134* 134*  K 4.2 4.1  CL 102 101  CO2 24 25  GLUCOSE 193* 148*  BUN 27* 27*  CREATININE 0.80 0.84  CALCIUM 8.4* 8.1*  MG 2.2 2.2  PHOS 3.0 3.3   Liver Function Tests  Recent Labs  12/17/15  [id number] 12/18/15 0429  AST 116* 61*  ALT 50 33  ALKPHOS 138* 116  BILITOT 0.3 0.2*  PROT 5.9* 5.7*  ALBUMIN 1.4* 1.5*   No results for input(s): LIPASE, AMYLASE in the last 72 hours. Cardiac Enzymes No results for input(s): CKTOTAL, CKMB, CKMBINDEX, TROPONINI in the last 72 hours. BNP Invalid input(s): POCBNP D-Dimer No results for input(s): DDIMER in the last 72 hours. Hemoglobin A1C No results for input(s): HGBA1C in the last 72 hours. Fasting Lipid Panel No results for input(s): CHOL, HDL, LDLCALC, TRIG, CHOLHDL, LDLDIRECT in the last 72 hours. Thyroid Function Tests No results for input(s): TSH, T4TOTAL, T3FREE, THYROIDAB in the last 72 hours.  Invalid input(s): FREET3  Telemetry    Sinus rhythm with PACs - Personally Reviewed  ECG    N/a  Radiology    Dg Chest 2 View  Result Date: 12/17/2015 CLINICAL DATA:  Shortness of breath, left-sided chest pain EXAM: CHEST  2 VIEW  COMPARISON:  CT chest of 12/14/2015, chest x-ray of 12/14/2015, and two-view chest x-ray of 11/12/2015 FINDINGS: There is abnormal opacity overlying the right hilum and suprahilar region consistent with the soft tissue mass described on recent CT of the chest. In view of the rapid development since chest x-ray of 11/12/2015, an infectious or inflammatory process would be more likely than neoplasm. No pneumonia is seen and there is a small right pleural effusion present. The heart is within upper limits normal. Partial compression of T8 and the anterior inferior aspect of T7 is noted which could also either be infectious, inflammatory, or neoplastic in origin. IMPRESSION: 1. Abnormal opacity overlies the right hilum/suprahilar region consistent with the previously described soft tissue mass involving the posterior medial right lower lobe and thoracic vertebral column. Favor infectious or inflammatory process as noted above. 2. Small right pleural effusion. Electronically Signed   By: Ivar Drape M.D.   On: 12/17/2015 11:57   Mr Thoracic Spine W Wo Contrast  Result Date: 12/16/2015 CLINICAL DATA:  Invasive thoracic mass. Bilateral lower extremity anesthesia. EXAM: MRI THORACIC SPINE WITHOUT AND WITH CONTRAST TECHNIQUE: Multiplanar and multiecho pulse sequences of the thoracic spine were obtained without and with intravenous contrast. CONTRAST:  42mL MULTIHANCE GADOBENATE DIMEGLUMINE 529 MG/ML IV SOLN COMPARISON:  Chest CT 12/14/2015 FINDINGS: There is loss of the normal T1 weighted signal within the T6 and T7 vertebral bodies with associated contrast enhancement. This is in continuity with a posterior right chest wall mass that is better characterized on the recent chest CT. On the current examination, this mass measures approximately 4.4 x 4.1 cm. There is a peripherally enhancing collection along the dorsal aspect of the spinal canal extending from the level of the T5-T6 disc to the T9 level, measuring up to 5  mm in thickness. This contributes to severe attenuation of the thecal sac and compression of the spinal cord. There is increased T2 weighted signal within the spinal cord at the T7 and T8 levels suggesting compressive myelopathy. Additionally, there is compression deformity of the T7 body with approximately 50% height loss centrally and mild retropulsion. The other thoracic levels are unremarkable without spinal canal stenosis. IMPRESSION: 1. Abnormal signal and contrast enhancement of the T6 and T7 vertebral bodies with associated T7 compression fracture. This is likely secondary to direct extension of the process within the right lower lobe. While this may be extension of a pulmonary malignancy invading the vertebral column, an infectious process of the lung with an associated discitis-osteomyelitis is also a possibility.  Given this constellation of findings, atypical infectious processes (such as tuberculosis) should be considered. 2. 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 3. 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 Critical Value/emergent results were called by telephone at the time of interpretation on 12/16/2015 at 6:53 pm to Dr. Raiford Noble , who verbally acknowledged these results. Electronically Signed   By: Ulyses Jarred M.D.   On: 12/16/2015 18:54    Cardiac Studies   Echo 12/17/15 LV EF: 60% -   65%  ------------------------------------------------------------------- Indications:      Atrial fibrillation - 427.31.  ------------------------------------------------------------------- History:   PMH:  GERD. Recent Fall.  Stroke.  Bacteremia.  ------------------------------------------------------------------- Study  Conclusions  - Left ventricle: The cavity size was normal. There was mild focal   basal hypertrophy of the septum. Systolic function was normal.   The estimated ejection fraction was in the range of 60% to 65%.   Images were inadequate for LV wall motion assessment. Features   are consistent with a pseudonormal left ventricular filling   pattern, with concomitant abnormal relaxation and increased   filling pressure (grade 2 diastolic dysfunction). - Aortic valve: Trileaflet; mildly thickened, mildly calcified   leaflets. - Mitral valve: Calcified annulus.   Patient Profile        Mr. Schrage is  A 47M with hypertension, diabetes, TIA on clopidogrel, s/p CEA, and GERD here with several days of worsening malaise/fatigue and a recent fall who then developed Afib with RVR in the ED. He subsequently converted to NSR following uptitration of a diltiazem gtt and additional bolus dosing of metoprolol but now going  In and out of  atrial fibrillation.  Hospitalization also complicated by closed fracture of 7 thoracic vertebrae, R lower lobe lung mass, urinary retention and [name].  For biopsy today.   Assessment & Plan    1. Afib with RVR - Going in and out. Currently in sinus rhythm with stable rate. Continue digoxin. He was started on Eliquis and received one dose, but this was stopped for biopsy of lung mass/spine. Continue cardizem 60mg  q 6 hours. May transition to long acting post biopsy. Echo showed normal LV function of 60-65%, grade 2 DD.   2. Carotid stenosis s/p CEA - Last doppler 5/17 showed patent L CEA and 40% R ICA.   3. T7 compression due to extension of RLL mass - Had stimulation yesterday. For biopsy today.   4. Prior CVA - Plavix held.   5. Abnormal LFTs - Improving  Signed, Deyonte Cadden, PA  12/18/2015, 8:24 AM

## 2015-12-18 NOTE — Progress Notes (Signed)
[facility] Progress Note  Patient: Marcus [name] [id number]   PCP: Jeanmarie Hubert, MD DOB: [id number]   DOA: 12/14/2015   DOS: 12/18/2015   Date of Service: the patient was seen and examined on 12/18/2015  Brief hospital course: Pt. with PMH of DM, HTN, CVA, GERD, BPH; admitted on 12/14/2015, with complaint of generalized weakness and a recent fall. Initial evaluation revealed right lower lobe lung mass, closed fracture of  T7 vertebrae, urinary retention, A. fib with RVR. He was worked up and found to have an [name]. Patient has been essentially bedbound, and developed paraplegia. Neurosurgery consulted and MRI of spine done. Neurosurgeon believes patient may have a destructive process secondary to cancer or secondary infection with profound 6 deficit of his spinal cord which will not recover despite any additional intervention. Work up for [employer] concerning for Malignancy vs. Atypical Pneumonia with communication to the Spine causing a Discitis/Osteomyelitis.  CT-guided biopsy performed on 12/18/2015. Simulation for radiation therapy performed on 12/17/2015. If the biopsy is positive patient would be transferred to [facility] for continuation of radiation therapy and further care of MSSAand bacteremia.  Currently further plan is continue further treatment for the [name] and follow the results of the biopsy for further treatment.  Assessment and Plan: 1.Right Lower Lobe Consolidation with Invasion into the Vertebral Column Concerning for Bronchogenic Malignancy vs. Atypical Pneumonia with an associated Discitis-Osteomyelitis -Unclear at this point if it is a Malignancy but favoring PNA with associated with Discitis/Osteomyeltits given [name] -MRI showed Peripherally enhancing collection along the dorsal aspect of the spinal canal is most consistent with epidural abscess, measuring up to 5 mm in thickness. -RLL 4.7 x 5 cm mass like consolidation   -Family admits to Early Satiety, Weight Loss and Pain out of Proportion -C/w Cefazolin for Abx Coverage, DuoNebs, Guaifenesin-Dextromethorphan, Flutter Valve. Follow Sputum Cx -IV Steroids with Dexamethasone -ID Dr. Graylon Good Following  2. T7 Compression Fracture 2/2 to Direct Extension of Process in Right Lower Lobe  -CT Scan showed Lytic destruction and compression of the T7 vertebral body with surrounding soft tissue, consistent with a pathologic fracture. Minimal involvement of the T6 vertebral body. -MRI shows Abnormal signal and contrast enhancement of the T6 and T7 vertebral bodies with associated T7 compression fracture. This is likely secondary to direct extension of the process within the right lower lobe. While this may be extension of a pulmonary malignancy invading the vertebral column, an infectious process of the lung with an associated discitis-osteomyelitis is also a possibility. -Radiation Oncology Consulted and Appreciate Recc's and possible Radiation Patient S/P CT-guided core biopsy of right lower lobe posterior mediastinal lesion on 12/18/2015. Continue morphine, Tylenol, Norco when necessary for pain control.  3. T6-T7 Compressive Myelopathy -History of Lumbar Spinal fusion 1991 per [name] -Neurosurgery [name] and recommended starting patient on Decadron 10 mg IV q6h -Was able to get MRI of Spine with Contrast after long discussion with [name] in Interventional Radiology who discussed the case with his colleague [name]. Because Patient has a Back Stimulator, MRI was not previously able to be done however risks outweighed benefits and proceeded with MRI.  -Neurosurgery evaluated the MRI and noted that profound high signal and swelling of the spinal cord consistent with spinal cord infarction. Although there was some stenosis and compression there was no focal lesion for which surgical intervention will improve his situation. And believes believe the  patient has a fixed deficit of his spi suspect  her mental nal cord which will not recover despite any additional Interventions -PT/OT Eval and Treat -C/w Decadron 10 mg IV q6h  Need to discuss with neurosurgery regarding mobility.  4. [name] with Osteomyelitis -WBC was 13.1 -> 11.1. -Unclear Source however possible Discitis/Osteomyelitis of Spine -C/w Cefazolin per ID Recc's Echocardiogram without any vegetation TEE requested  5. Atrial Flutter/Atrial Fibrillation with RVR s/p Conversion to NSR -TSH normal -Cardizem gtt -Cardiology Evaluation and Recc's appreciated -Patient was given Digoxin IV x 3, on oral Cardizem now -Was started on Apixaban and received one dose but will Hold off Anticoagulation for now in case patient undergoes Biopsy or Abscess Drainage in Spine Echogram shows [id number] EF, diastolic dysfunction, no wall motion abnormality. -Will need Anticoagulation once all the invasive procedures are completed.  Diabetes Mellitus Type 2.  -Diet controlled. -Continue to Monitor  Hypotension  currently resolved -Continue to Monitor closely  Urinary retention with Hx of BPH and   -Patient with a history of urethral stricture.  -Foley placed by Urology -Strict I's and O's  History of CVA from [facility]  -Has some residual right-sided weakness. On Plavix. -Hold Plavix for now -Carotid Dopplers  Bilateral leg edema. Patient's legs are swollen. We will get Doppler of the legs to rule out DVT. Compressive stockings and SCDs are also ordered.  Pain management: When necessary Tylenol, when necessary Norco, when necessary morphine Activity: Consulted physical therapy Bowel regimen: last BM 12/13/2015, stool softeners added. Diet: Cardiac diet DVT Prophylaxis: subcutaneous Heparin  Advance goals of care discussion: Full code  Family Communication: family was present at bedside, at the time of interview. The pt provided permission to discuss medical  plan with the family. Opportunity was given to ask question and all questions were answered satisfactorily.   Disposition:  Pending workup, likely CIR.  Consultants: Neurosurgery, interventional radiology, infectious disease, radiation oncology. Procedures:  Echocardiogram, lower extremity Doppler ordered, CT-guided biopsy Antibiotics: Anti-infectives    Start     Dose/Rate Route Frequency Ordered Stop   12/15/15 1400  ceFAZolin (ANCEF) IVPB 2g/100 mL premix     2 g 200 mL/hr over 30 Minutes Intravenous Every 8 hours 12/15/15 1049     12/14/15 1545  cefTRIAXone (ROCEPHIN) 1 g in dextrose 5 % 50 mL IVPB  Status:  Discontinued     1 g 100 mL/hr over 30 Minutes Intravenous Every 24 hours 12/14/15 1536 12/15/15 1049   12/14/15 1545  azithromycin (ZITHROMAX) tablet 500 mg  Status:  Discontinued     500 mg Oral Every 24 hours 12/14/15 1536 12/15/15 1408   12/14/15 1045  cefTRIAXone (ROCEPHIN) 1 g in dextrose 5 % 50 mL IVPB     1 g 100 mL/hr over 30 Minutes Intravenous  Once 12/14/15 1044 12/14/15 1222   12/14/15 1045  azithromycin (ZITHROMAX) 500 mg in dextrose 5 % 250 mL IVPB     500 mg 250 mL/hr over 60 Minutes Intravenous  Once 12/14/15 1044 12/14/15 1357        Subjective: Patient can reflexively move his leg, but not at his little, no cough no chest pain and nausea and vomiting.  Objective: Physical Exam: Vitals:   12/18/15 1132 12/18/15 1137 12/18/15 1205 12/18/15 1623  BP: (!) 96/49 (!) 106/51 130/73 106/80  Pulse: 66 72 66 71  Resp: (!) 21 16 (!) 25 20  Temp:   97.8 F (36.6 C) 97.9 F (36.6 C)  TempSrc:   Oral Oral  SpO2: 99% 100% 94% 93%  Weight:      Height:        Intake/Output Summary (Last 24 hours) at 12/18/15 1711 Last data filed at 12/18/15 1500  Gross per 24 hour  Intake             1620 ml  Output             2150 ml  Net             -530 ml   Filed Weights   12/16/15 0545 12/17/15 0702 12/18/15 0900  Weight: 93.4 kg (205 lb 12.8 oz) 94.4 kg (208  lb 1.6 oz) 97.1 kg (214 lb 1.6 oz)    General: Alert, Awake and Oriented to Time, Place and Person. Appear in mild distress, affect appropriate Eyes: PERRL, Conjunctiva normal ENT: Oral Mucosa clear moist. Neck: no JVD, no Abnormal Mass Or lumps Cardiovascular: S1 and S2 Present, no Murmur, Respiratory: Bilateral Air entry equal and Decreased, no use of accessory muscle, Clear to Auscultation, no Crackles, no wheezes Abdomen: Bowel Sound present, Soft and no tenderness Skin: no redness, no Rash, no induration Extremities: bilateral Pedal edema, no calf tenderness Neurologic: Grossly no focal neuro deficit. Bilaterally Equal motor strength upper extremity, lower extremity Babinski positive,  Data Reviewed: CBC:  Recent Labs Lab 12/14/15 0635 12/15/15 0351 12/16/15 0432 12/17/15 0337 12/18/15 0429  WBC 15.9* 13.1* 11.1* 6.5 9.2  NEUTROABS  --   --  9.4* 5.9 8.4*  HGB 11.7* 10.5* 10.6* 11.4* 10.3*  HCT 35.7* 32.7* 33.0* 35.4* 32.0*  MCV 93.2 93.7 93.5 93.4 92.5  PLT 291 275 286 321 [id number]   Basic Metabolic Panel:  Recent Labs Lab 12/14/15 0635 12/15/15 0351 12/16/15 0432 12/17/15 0337 12/18/15 0429  NA 132* 132* 134* 134* 134*  K 3.6 3.6 3.5 4.2 4.1  CL 93* 96* 99* 102 101  CO2 29 28 27 24 25   GLUCOSE 124* 110* 99 193* 148*  BUN 12 16 22* 27* 27*  CREATININE 0.79 0.86 0.89 0.80 0.84  CALCIUM 8.6* 8.6* 8.5* 8.4* 8.1*  MG  --   --  2.1 2.2 2.2  PHOS  --   --  3.2 3.0 3.3    Liver Function Tests:  Recent Labs Lab 12/14/15 0635 12/16/15 0432 12/17/15 0337 12/18/15 0429  AST 44* 87* 116* 61*  ALT 28 45 50 33  ALKPHOS 183* 146* 138* 116  BILITOT 0.7 0.5 0.3 0.2*  PROT 7.9 6.0* 5.9* 5.7*  ALBUMIN 1.9* 1.5* 1.4* 1.5*   No results for input(s): LIPASE, AMYLASE in the last 168 hours. No results for input(s): AMMONIA in the last 168 hours. Coagulation Profile:  Recent Labs Lab 12/14/15 1721  INR 1.04   Cardiac Enzymes: No results for input(s): CKTOTAL,  CKMB, CKMBINDEX, TROPONINI in the last 168 hours. BNP (last 3 results) No results for input(s): PROBNP in the last 8760 hours.  CBG:  Recent Labs Lab 12/14/15 0638  GLUCAP 138*    Studies: Ct Guided Needle Placement  Result Date: 12/18/2015 INDICATION: 75 year old male with a history of right lower lobe/posterior mediastinal mass with destruction of the T7 vertebral body. He also has new onset paraplegia. CT-guided biopsy is warranted to obtain tissue diagnosis. EXAM: CT GUIDANCE NEEDLE PLACEMENT MEDICATIONS: None. ANESTHESIA/SEDATION: Moderate (conscious) sedation was employed during this procedure. A total of Versed 2 mg and Fentanyl 100 mcg was administered intravenously. Moderate Sedation Time: 14 minutes. The patient's level of consciousness and vital signs were monitored continuously by radiology nursing throughout  the procedure under my direct supervision. FLUOROSCOPY TIME:  Fluoroscopy Time: 0 minutes 0 seconds (0 mGy). COMPLICATIONS: None immediate. PROCEDURE: Informed written consent was obtained from the patient after a thorough discussion of the procedural risks, benefits and alternatives. All questions were addressed. Maximal Sterile Barrier Technique was utilized including caps, mask, sterile gowns, sterile gloves, sterile drape, hand hygiene and skin antiseptic. A timeout was performed prior to the initiation of the procedure. A planning axial CT scan was performed. The infiltrative mass along the medial aspect of the right lower lobe directly invading the posterior mediastinum was successfully identified. A suitable skin entry site was selected and marked. The region was sterilely prepped and draped in the standard fashion using chlorhexidine skin prep. Local anesthesia was attained by infiltration with 1% lidocaine. A small dermatotomy was made. Under intermittent CT guidance, a 17 gauge introducer needle was advanced into the margin of the soft tissue mass. Multiple 18 gauge core  biopsies were then coaxially obtained using the bio Pince automated biopsy device. Biopsy specimens were placed in saline and delivered to pathology for further analysis. Additional 20 gauge needle aspirate was performed and inject into a small amount of sterile saline for cultures. Post biopsy axial CT imaging demonstrates no evidence of pneumothorax, hemorrhage or other complicating feature. IMPRESSION: Technically successful CT-guided core biopsy of right lower lobe/ posterior mediastinal mass lesion. Signed, Criselda Peaches, MD [facility] Radiology Electronically Signed   By: Jacqulynn Cadet M.D.   On: 12/18/2015 12:58     Scheduled Meds: .  ceFAZolin (ANCEF) IV  2 g Intravenous Q8H  . dexamethasone  10 mg Intravenous Q6H  . digoxin  0.25 mg Oral Daily  . [START ON 12/19/2015] diltiazem  180 mg Oral Daily  . diltiazem  60 mg Oral Q6H  . fentaNYL      . heparin subcutaneous  5,000 Units Subcutaneous Q8H  . lidocaine      . LORazepam  0.5 mg Oral Daily  . midazolam      . polyethylene glycol  17 g Oral Daily  . senna-docusate  1 tablet Oral BID  . timolol  1 drop Both Eyes BID   Continuous Infusions:  PRN Meds: acetaminophen, acetaminophen, guaiFENesin-dextromethorphan, HYDROcodone-acetaminophen, ipratropium-albuterol, LORazepam, morphine injection, ondansetron (ZOFRAN) IV  Time spent: 30 minutes  Author: Berle Mull, MD [facility] Pager: [phone] 12/18/2015 5:11 PM  If 7PM-7AM, please contact night-coverage at [email or web address], password [facility]

## 2015-12-18 NOTE — Progress Notes (Signed)
Earlier this shift lab tech who had drawn patient's blood cultures told RN that patient was making comments such as he didn't think he was going to live much longer and he only had a few days left to live.  RN into check on patient and patient was sleeping and continued to sleep the next couple of times RN checked on patient.  RN into room to complete assessment, RN woke patient up.  Patient was alert to self when he woke up.  RN re-oriented patient that he was at the hospital.  Patient became tearful and wanted to hold RN's hand.  RN sat in chair beside bed and talked with patient and held his hand.  As patient continued to talk to RN he was no longer tearful.  RN asked patient if he wanted to call someone from his family and maybe wanted a family member to stay the night with him and patient replied yes.  RN asked patient who he wanted called and patient replied his daughter, Marcus [name].  Call placed to [place] was updated per RN and then RN gave phone to patient and patient spoke with [place].  Once off the phone patient stated Marcus [name] was going to come stay the night with patient.  RN stayed at patient's bedside and shortly thereafter [place] arrived.

## 2015-12-18 NOTE — Care Management Important Message (Signed)
Important Message  Patient Details  Name: Marcus [name] MRN: [id number] Date of Birth: 1940-04-20   Medicare Important Message Given:  Yes    Nathen May 12/18/2015, 10:57 AM

## 2015-12-18 NOTE — Sedation Documentation (Signed)
Patient denies pain and is resting comfortably.  

## 2015-12-19 ENCOUNTER — Inpatient Hospital Stay (HOSPITAL_COMMUNITY): Payer: Medicare Other

## 2015-12-19 ENCOUNTER — Ambulatory Visit: Payer: Medicare Other | Admitting: Radiation Oncology

## 2015-12-19 DIAGNOSIS — M7989 Other specified soft tissue disorders: Secondary | ICD-10-CM

## 2015-12-19 DIAGNOSIS — W19XXXD Unspecified fall, subsequent encounter: Secondary | ICD-10-CM

## 2015-12-19 DIAGNOSIS — S22069D Unspecified fracture of T7-T8 vertebra, subsequent encounter for fracture with routine healing: Secondary | ICD-10-CM

## 2015-12-19 DIAGNOSIS — G8222 Paraplegia, incomplete: Secondary | ICD-10-CM

## 2015-12-19 DIAGNOSIS — L8990 Pressure ulcer of unspecified site, unspecified stage: Secondary | ICD-10-CM | POA: Diagnosis present

## 2015-12-19 LAB — CBC WITH DIFFERENTIAL/PLATELET
BASOS ABS: 0 10*3/uL (ref 0.0–0.1)
Basophils Relative: 0 %
EOS ABS: 0 10*3/uL (ref 0.0–0.7)
EOS PCT: 0 %
HCT: 32.5 % — ABNORMAL LOW (ref 39.0–52.0)
Hemoglobin: 10.4 g/dL — ABNORMAL LOW (ref 13.0–17.0)
LYMPHS PCT: 6 %
Lymphs Abs: 0.5 10*3/uL — ABNORMAL LOW (ref 0.7–4.0)
MCH: 29.6 pg (ref 26.0–34.0)
MCHC: 32 g/dL (ref 30.0–36.0)
MCV: 92.6 fL (ref 78.0–100.0)
MONO ABS: 0.1 10*3/uL (ref 0.1–1.0)
Monocytes Relative: 2 %
Neutro Abs: 7 10*3/uL (ref 1.7–7.7)
Neutrophils Relative %: 92 %
PLATELETS: 359 10*3/uL (ref 150–400)
RBC: 3.51 MIL/uL — AB (ref 4.22–5.81)
RDW: 14 % (ref 11.5–15.5)
WBC: 7.6 10*3/uL (ref 4.0–10.5)

## 2015-12-19 LAB — COMPREHENSIVE METABOLIC PANEL
ALBUMIN: 1.5 g/dL — AB (ref 3.5–5.0)
ALK PHOS: 114 U/L (ref 38–126)
ALT: 37 U/L (ref 17–63)
AST: 69 U/L — AB (ref 15–41)
Anion gap: 6 (ref 5–15)
BILIRUBIN TOTAL: 0.3 mg/dL (ref 0.3–1.2)
BUN: 33 mg/dL — AB (ref 6–20)
CALCIUM: 8.2 mg/dL — AB (ref 8.9–10.3)
CO2: 26 mmol/L (ref 22–32)
CREATININE: 0.77 mg/dL (ref 0.61–1.24)
Chloride: 107 mmol/L (ref 101–111)
GFR calc Af Amer: >60 mL/min (ref >60)
GFR calc non Af Amer: >60 mL/min (ref >60)
GLUCOSE: 145 mg/dL — AB (ref 65–99)
Potassium: 4.4 mmol/L (ref 3.5–5.1)
Sodium: 139 mmol/L (ref 135–145)
TOTAL PROTEIN: 5.4 g/dL — AB (ref 6.5–8.1)

## 2015-12-19 LAB — ACID FAST SMEAR (AFB): ACID FAST SMEAR - AFSCU2: NEGATIVE

## 2015-12-19 LAB — GLUCOSE, CAPILLARY
GLUCOSE-CAPILLARY: 143 mg/dL — AB (ref 65–99)
Glucose-Capillary: 148 mg/dL — ABNORMAL HIGH (ref 65–99)

## 2015-12-19 LAB — MAGNESIUM: Magnesium: 2.3 mg/dL (ref 1.7–2.4)

## 2015-12-19 LAB — ACID FAST SMEAR (AFB, MYCOBACTERIA)

## 2015-12-19 LAB — DIGOXIN LEVEL: DIGOXIN LVL: 0.9 ng/mL (ref 0.8–2.0)

## 2015-12-19 MED ORDER — SODIUM CHLORIDE 0.9 % IV SOLN: INTRAVENOUS | Status: DC

## 2015-12-19 MED ORDER — INSULIN ASPART 100 UNIT/ML ~~LOC~~ SOLN
0.0000 [IU] | Freq: Four times a day (QID) | SUBCUTANEOUS | Status: DC
  Administered 2015-12-20 (×3): 1 [IU] via SUBCUTANEOUS
  Administered 2015-12-21: 2 [IU] via SUBCUTANEOUS
  Administered 2015-12-21 (×2): 1 [IU] via SUBCUTANEOUS
  Administered 2015-12-23: 2 [IU] via SUBCUTANEOUS
  Administered 2015-12-23 – 2015-12-24 (×5): 1 [IU] via SUBCUTANEOUS

## 2015-12-19 MED ORDER — CLOPIDOGREL BISULFATE 75 MG PO TABS
75.0000 mg | ORAL_TABLET | Freq: Every day | ORAL | Status: DC
Start: 2015-12-19 — End: 2015-12-24
  Administered 2015-12-19 – 2015-12-24 (×6): 75 mg via ORAL
  Filled 2015-12-19 (×6): qty 1

## 2015-12-19 MED ORDER — ATORVASTATIN CALCIUM 40 MG PO TABS
40.0000 mg | ORAL_TABLET | Freq: Every day | ORAL | Status: DC
  Administered 2015-12-19 – 2015-12-24 (×6): 40 mg via ORAL
  Filled 2015-12-19 (×6): qty 1

## 2015-12-19 MED ORDER — DULOXETINE HCL 30 MG PO CPEP
30.0000 mg | ORAL_CAPSULE | Freq: Every day | ORAL | Status: DC
  Administered 2015-12-19 – 2015-12-24 (×5): 30 mg via ORAL
  Filled 2015-12-19 (×6): qty 1

## 2015-12-19 MED ORDER — VITAMIN D 1000 UNITS PO TABS
5000.0000 [IU] | ORAL_TABLET | Freq: Every day | ORAL | Status: DC
  Administered 2015-12-19 – 2015-12-23 (×5): 5000 [IU] via ORAL
  Filled 2015-12-19 (×5): qty 5

## 2015-12-19 MED ORDER — INSULIN ASPART 100 UNIT/ML ~~LOC~~ SOLN
0.0000 [IU] | SUBCUTANEOUS | Status: DC
  Administered 2015-12-19: 2 [IU] via SUBCUTANEOUS

## 2015-12-19 NOTE — Progress Notes (Signed)
Occupational Therapy Treatment Patient Details Name: Marcus [name] MRN: [id number] DOB: Jul 08, 1940 Today's Date: 12/19/2015    History of present illness 75 y.o. male admitted to [facility] on 12/14/15 for LE weakness after fall, pain.  Initial eval revealed CAP, L lower lobe mass (bx on 12/18/15), and closed fx of T7 vertebrae (resultant weakness and loss of sensation from T7 down), urinary retention, A-fib with RVR.  Neurosurgeon consulted and pt not thought to be a surgical candidate.  Pt with significant PMhx of urethral stricture, DM, TIA, stroke, low back pain, essential tremor, anxiety, and spinal fusion surgery.   OT comments  Pt progressing towards acute OT goals. Focus of session was sitting tolerance/balance EOB. Pain a limiting factoring. Pt very motivated to participate in therapy. Family present at start and end of session. Session details below.   Follow Up Recommendations  CIR    Equipment Recommendations  Other (comment) (deferred to next venue of care)    Recommendations for Other Services      Precautions / Restrictions Precautions Precautions: Fall Precaution Comments: h/o fall, now with inability to move legs and poor trunk control Restrictions Weight Bearing Restrictions: No       Mobility Bed Mobility Overal bed mobility: +2 for physical assistance;Needs Assistance Bed Mobility: Rolling;Sidelying to Sit;Sit to Sidelying Rolling: +2 for physical assistance;Max assist Sidelying to sit: +2 for physical assistance;Max assist     Sit to sidelying: +2 for physical assistance;Max assist General bed mobility comments: +2 assist to roll to R side and transition into sitting position EOB. Pt needing manual assist and cues for proper technique and hand placement. Pt reporting increased pain but motivated to continue.   Transfers                 General transfer comment: not attempted secondary to safety    Balance Overall balance assessment: Needs  assistance Sitting-balance support: Bilateral upper extremity supported Sitting balance-Leahy Scale: Poor Sitting balance - Comments: BUE support for stability. Fearful of falling. Very motivated. Pain a limiting factor.                           ADL Overall ADL's : Needs assistance/impaired                                       General ADL Comments: Pt sat EOB for about 4 minutes with +1 mod A to periods of min guard using BUE positioned on bed to provide support.        Vision                     Perception     Praxis      Cognition   Behavior During Therapy: WFL for tasks assessed/performed Overall Cognitive Status: Within Functional Limits for tasks assessed                       Extremity/Trunk Assessment               Exercises     Shoulder Instructions       General Comments      Pertinent Vitals/ Pain       Pain Assessment: Faces Faces Pain Scale: Hurts worst Pain Location: Pt traced a line, using therapist as demo, around to indicate path around trunk at approximately level  of injury (T7).  Pain increases with mobility. Pain Descriptors / Indicators: Aching;Grimacing;Guarding;Moaning Pain Intervention(s): Limited activity within patient's tolerance;Monitored during session;Repositioned;Patient requesting pain meds-RN notified  Home Living                                          Prior Functioning/Environment              Frequency  Min 2X/week        Progress Toward Goals  OT Goals(current goals can now be found in the care plan section)  Progress towards OT goals: Progressing toward goals  Acute Rehab OT Goals Patient Stated Goal: to go home OT Goal Formulation: With patient Time For Goal Achievement: 01/01/16 Potential to Achieve Goals: Fair ADL Goals Pt Will Perform Grooming: with min assist;sitting Pt Will Perform Upper Body Bathing: with min assist;sitting Pt Will  Perform Lower Body Bathing: with mod assist Additional ADL Goal #1: Pt will engage in 10 minutes of functional tasks with 2 rest breaks or less. Additional ADL Goal #2: Pt will perform bed rolling L <> R for hygiene with assist of 1 person.   Plan Discharge plan remains appropriate    Co-evaluation                 End of Session     Activity Tolerance Patient limited by pain   Patient Left in bed;with call bell/phone within reach;with family/visitor present;with bed alarm set   Nurse Communication Patient requests pain meds        Time: 1322-1350 OT Time Calculation (min): 28 min  Charges: OT General Charges $OT Visit: 1 Procedure OT Treatments $Self Care/Home Management : 23-37 mins  Hortencia Pilar 12/19/2015, 2:13 PM

## 2015-12-19 NOTE — Progress Notes (Signed)
Patient Name: Marcus [name] Date of Encounter: 12/19/2015  Primary Cardiologist: Kate Sable (new)  Hospital Problem List     Principal Problem:   Atrial fibrillation with RVR [facility]) Active Problems:   Essential hypertension   Carotid stenosis   Diabetes ([place])   HLD (hyperlipidemia)   CVA (cerebral vascular accident) ([place])   Fall   CAP (community acquired pneumonia)   Compression fracture of vertebra ([place])   Urinary retention   New onset a-fib ([place])   Right lower lobe lung mass   Lower extremity weakness   Bacteremia   T7 vertebral fracture (HCC)   Decreased sensation   Closed fracture of seventh thoracic vertebra (HCC)    Subjective   No complains. Denies chest pain or SOB.   Inpatient Medications    Scheduled Meds: .  ceFAZolin (ANCEF) IV  2 g Intravenous Q8H  . dexamethasone  10 mg Intravenous Q6H  . digoxin  0.25 mg Oral Daily  . diltiazem  180 mg Oral Daily  . heparin subcutaneous  5,000 Units Subcutaneous Q8H  . LORazepam  0.5 mg Oral Daily  . polyethylene glycol  17 g Oral Daily  . senna-docusate  1 tablet Oral BID  . timolol  1 drop Both Eyes BID   Continuous Infusions:   PRN Meds: acetaminophen, acetaminophen, guaiFENesin-dextromethorphan, HYDROcodone-acetaminophen, ipratropium-albuterol, LORazepam, morphine injection, ondansetron (ZOFRAN) IV   Vital Signs    Vitals:   12/19/15 0753 12/19/15 0806 12/19/15 0818 12/19/15 0900  BP: (!) 148/78     Pulse:  67  (!) 109  Resp:      Temp: 98.5 F (36.9 C)   97.5 F (36.4 C)  TempSrc: Axillary   Oral  SpO2:    98%  Weight:   216 lb 1.6 oz (98 kg)   Height:        Intake/Output Summary (Last 24 hours) at 12/19/15 1026 Last data filed at 12/19/15 0926  Gross per 24 hour  Intake             1115 ml  Output             1175 ml  Net              -60 ml   Filed Weights   12/17/15 0702 12/18/15 0900 12/19/15 0818  Weight: 208 lb 1.6 oz (94.4 kg) 214 lb 1.6 oz (97.1 kg) 216 lb 1.6 oz (98  kg)    Physical Exam    GEN: Well nourished, well developed, in no acute distress currently having nebulizer treatment HEENT: Grossly normal.  Neck: Supple, no JVD, carotid bruits, or masses. Cardiac: RRR, no murmurs, rubs, or gallops. No clubbing, cyanosis, mild BL LE edema.  Radials/DP/PT 2+ and equal bilaterally. TED hose in place.  Respiratory:  Respirations regular and unlabored, diffuse wheezing GI: Soft, nontender, nondistended, BS + x 4. MS: no deformity or atrophy. Skin: warm and dry, no rash. Neuro:  Unable to move bilateral LEs. Psych: AAOx3.  Normal affect.  Labs    CBC  Recent Labs  12/18/15 0429 12/19/15 0356  WBC 9.2 7.6  NEUTROABS 8.4* 7.0  HGB 10.3* 10.4*  HCT 32.0* 32.5*  MCV 92.5 92.6  PLT 376 [id number]   Basic Metabolic Panel  Recent Labs  12/17/15 0337 12/18/15 0429 12/19/15 0356  NA 134* 134* 139  K 4.2 4.1 4.4  CL 102 101 107  CO2 24 25 26   GLUCOSE 193* 148* 145*  BUN 27* 27* 33*  CREATININE 0.80 0.84 0.77  CALCIUM 8.4* 8.1* 8.2*  MG 2.2 2.2 2.3  PHOS 3.0 3.3  --    Liver Function Tests  Recent Labs  12/18/15 0429 12/19/15 0356  AST 61* 69*  ALT 33 37  ALKPHOS 116 114  BILITOT 0.2* 0.3  PROT 5.7* 5.4*  ALBUMIN 1.5* 1.5*     Telemetry    Sinus rhythm with PACs - Personally Reviewed  ECG    N/a  Radiology    Dg Chest 2 View  Result Date: 12/17/2015 CLINICAL DATA:  Shortness of breath, left-sided chest pain EXAM: CHEST  2 VIEW COMPARISON:  CT chest of 12/14/2015, chest x-ray of 12/14/2015, and two-view chest x-ray of 11/12/2015 FINDINGS: There is abnormal opacity overlying the right hilum and suprahilar region consistent with the soft tissue mass described on recent CT of the chest. In view of the rapid development since chest x-ray of 11/12/2015, an infectious or inflammatory process would be more likely than neoplasm. No pneumonia is seen and there is a small right pleural effusion present. The heart is within upper limits  normal. Partial compression of T8 and the anterior inferior aspect of T7 is noted which could also either be infectious, inflammatory, or neoplastic in origin. IMPRESSION: 1. Abnormal opacity overlies the right hilum/suprahilar region consistent with the previously described soft tissue mass involving the posterior medial right lower lobe and thoracic vertebral column. Favor infectious or inflammatory process as noted above. 2. Small right pleural effusion. Electronically Signed   By: Ivar Drape M.D.   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 He also has new onset paraplegia. CT-guided biopsy is warranted to obtain tissue diagnosis. EXAM: CT GUIDANCE NEEDLE PLACEMENT MEDICATIONS: None. ANESTHESIA/SEDATION: Moderate (conscious) sedation was employed during this procedure. A total of Versed 2 mg and Fentanyl 100 mcg was administered intravenously. Moderate Sedation Time: 14 minutes. The patient's level of consciousness and vital signs were monitored continuously by radiology nursing throughout the procedure under my direct supervision. FLUOROSCOPY TIME:  Fluoroscopy Time: 0 minutes 0 seconds (0 mGy). COMPLICATIONS: None immediate. PROCEDURE: Informed written consent was obtained from the patient after a thorough discussion of the procedural risks, benefits and alternatives. All questions were addressed. Maximal Sterile Barrier Technique was utilized including caps, mask, sterile gowns, sterile gloves, sterile drape, hand hygiene and skin antiseptic. A timeout was performed prior to the initiation of the procedure. A planning axial CT scan was performed. The infiltrative mass along the medial aspect of the right lower lobe directly invading the posterior mediastinum was successfully identified. A suitable skin entry site was selected and marked.  The region was sterilely prepped and draped in the standard fashion using chlorhexidine skin prep. Local anesthesia was attained by infiltration with 1% lidocaine. A small dermatotomy was made. Under intermittent CT guidance, a 17 gauge introducer needle was advanced into the margin of the soft tissue mass. Multiple 18 gauge core biopsies were then coaxially obtained using the bio Pince automated biopsy device. Biopsy specimens were placed in saline and delivered to pathology for further analysis. Additional 20 gauge needle aspirate was performed and inject into a small amount of sterile saline for cultures. Post biopsy axial CT imaging demonstrates no evidence of pneumothorax, hemorrhage or other complicating feature. IMPRESSION: Technically successful CT-guided core biopsy of right lower lobe/ posterior mediastinal mass lesion. Signed, Antonietta Jewel.  Laurence Ferrari, MD [facility] Radiology Electronically Signed   By: Jacqulynn Cadet M.D.   On: 12/18/2015 12:58    Cardiac Studies   Echo 12/17/15 LV EF: 60% -   65%  ------------------------------------------------------------------- Indications:      Atrial fibrillation - 427.31.  ------------------------------------------------------------------- History:   PMH:  GERD. Recent Fall.  Stroke.  Bacteremia.  ------------------------------------------------------------------- Study Conclusions  - Left ventricle: The cavity size was normal. There was mild focal   basal hypertrophy of the septum. Systolic function was normal.   The estimated ejection fraction was in the range of 60% to 65%.   Images were inadequate for LV wall motion assessment. Features   are consistent with a pseudonormal left ventricular filling   pattern, with concomitant abnormal relaxation and increased   filling pressure (grade 2 diastolic dysfunction). - Aortic valve: Trileaflet; mildly thickened, mildly calcified    leaflets. - Mitral valve: Calcified annulus.   Patient Profile        Mr. Carmel is  A 84M with hypertension, diabetes, TIA on clopidogrel, s/p CEA, and GERD here with several days of worsening malaise/fatigue and a recent fall who then developed Afib with RVR in the ED. He subsequently converted to NSR following uptitration of a diltiazem gtt and additional bolus dosing of metoprolol but now going  In and out of  atrial fibrillation.  Hospitalization also complicated by closed fracture of 7 thoracic vertebrae, R lower lobe lung mass, urinary retention and [name].  For biopsy today.   Assessment & Plan    1. Afib with RVR - Going in and out. Currently in sinus rhythm with stable rate. Continue digoxin. Level normal this morning.  He was started on Eliquis and received one dose, but this was stopped for biopsy of lung mass/spine. Echo showed normal LV function of 60-65%, grade 2 DD. Rate stable. Continue Cardizem CD 180mg . His BP is minimally elevated today. Follow closely.   2. Carotid stenosis s/p CEA - Last doppler 5/17 showed patent L CEA and 40% R ICA.   3. T7 compression due to extension of RLL mass - Had stimulation. Biopsy done yesterday--> showed WBCs. Pending final report.   4. Prior CVA - Plavix held.   5. Abnormal LFTs - Stable today.   6. [name] - Per primary. Echo without vegetation.   7. LE edema - pending LE doppler to r/o DVT  Signed, Catrell Morrone, PA  12/19/2015, 10:26 AM

## 2015-12-19 NOTE — Progress Notes (Signed)
[name] for Infectious Disease    Date of Admission:  12/14/2015   Total days of antibiotics 6        Day 5 cefazolin           ID: Marcus [name] is a 75 y.o. 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 He was also found to have concurrent [name] with lung mass/abscess. He remains afebrile. Being evaluated by IP rehab Principal Problem:   Atrial fibrillation with RVR ([place]) Active Problems:   Essential hypertension   Carotid stenosis   Diabetes ([place])   HLD (hyperlipidemia)   CVA (cerebral vascular accident) ([place])   Fall   CAP (community acquired pneumonia)   Compression fracture of vertebra ([place])   Urinary retention   New onset a-fib (Marcus [name])   Right lower lobe lung mass   Lower extremity weakness   Bacteremia   T7 vertebral fracture (HCC)   Decreased sensation   Closed fracture of seventh thoracic vertebra (HCC)   Medications:  .  ceFAZolin (ANCEF) IV  2 g Intravenous Q8H  . dexamethasone  10 mg Intravenous Q6H  . digoxin  0.25 mg Oral Daily  . diltiazem  180 mg Oral Daily  . heparin subcutaneous  5,000 Units Subcutaneous Q8H  . LORazepam  0.5 mg Oral Daily  . polyethylene glycol  17 g Oral Daily  . senna-docusate  1 tablet Oral BID  . timolol  1 drop Both Eyes BID    Objective: Vital signs in last 24 hours: Temp:  [97.5 F (36.4 C)-98.5 F (36.9 C)] 97.5 F (36.4 C) (10/19 0900) Pulse Rate:  [56-109] 109 (10/19 0900) Resp:  [16-20] 16 (10/19 0416) BP: (106-148)/(66-80) 148/78 (10/19 0753) SpO2:  [93 %-98 %] 98 % (10/19 0900) Weight:  [216 lb 1.6 oz (98 kg)] 216 lb 1.6 oz (98 kg) (10/19 0818)  Microbiology: 10/17 blood cx ngtd 10/18 aspirate of lung lesion + growth Studies/Results: Ct Guided Needle Placement  Result Date: 12/18/2015 INDICATION: 75 year old male with a history of right lower lobe/posterior  mediastinal mass with destruction of the T7 vertebral body. He also has new onset paraplegia. CT-guided biopsy is warranted to obtain tissue diagnosis. EXAM: CT GUIDANCE NEEDLE PLACEMENT MEDICATIONS: None. ANESTHESIA/SEDATION: Moderate (conscious) sedation was employed during this procedure. A total of Versed 2 mg and Fentanyl 100 mcg was administered intravenously. Moderate Sedation Time: 14 minutes. The patient's level of consciousness and vital signs were monitored continuously by radiology nursing throughout the procedure under my direct supervision. FLUOROSCOPY TIME:  Fluoroscopy Time: 0 minutes 0 seconds (0 mGy). COMPLICATIONS: None immediate. PROCEDURE: Informed written consent was obtained from the patient after a thorough discussion of the procedural risks, benefits and alternatives. All questions were addressed. Maximal Sterile Barrier Technique was utilized including caps, mask, sterile gowns, sterile gloves, sterile drape, hand hygiene and skin antiseptic. A timeout was performed prior to the initiation of the procedure. A planning axial CT scan was performed. The infiltrative mass along the medial aspect of the right lower lobe directly invading the posterior mediastinum was successfully identified. A suitable skin entry site was selected and marked. The region was sterilely prepped and draped in the standard fashion using chlorhexidine skin prep. Local anesthesia was attained by infiltration with 1% lidocaine. A small dermatotomy was made. Under intermittent CT guidance, a 17 gauge introducer needle was advanced into the margin of  the soft tissue mass. Multiple 18 gauge core biopsies were then coaxially obtained using the bio Pince automated biopsy device. Biopsy specimens were placed in saline and delivered to pathology for further analysis. Additional 20 gauge needle aspirate was performed and inject into a small amount of sterile saline for cultures. Post biopsy axial CT imaging demonstrates no  evidence of pneumothorax, hemorrhage or other complicating feature. IMPRESSION: Technically successful CT-guided core biopsy of right lower lobe/ posterior mediastinal mass lesion. Signed, Criselda Peaches, MD [facility] Radiology Electronically Signed   By: Jacqulynn Cadet M.D.   On: 12/18/2015 12:58     Assessment/Plan: mssa complicated bacteremia with possibly lung abscess = recommend to treat for a total of 6 wk. Using oct 18th as day 1. Continue on cefazolin 2gm IV q8  Will follow up as outpatient.   Baxter Flattery [facility] Infectious [facility] Cell: [phone] Pager: [phone]  12/19/2015, 1:15 PM

## 2015-12-19 NOTE — Progress Notes (Addendum)
Progress Note    Marcus [name]  [id number] DOB: 06-Aug-1940  DOA: 12/14/2015 PCP: Jeanmarie Hubert, MD    Brief Narrative:   Chief complaint: F/U RLL lung mass/[name]/lower extremity weakness  Marcus [name] is an 75 y.o. male with PMH of hypertension, diabetes, TIA and GERD as well as recent UTI who was admitted 12/14/15 with chief complaint of new onset lower extremity weakness. In the ED, he developed atrial fibrillation with RVR and was placed on a Cardizem drip and given Lopressor. Urology was consulted for urinary retention and placed a Foley catheter. Broad-spectrum antibiotics were initiated. Imaging showed a closed fracture of T7 and a right lower lobe lung mass raising the question of whether he had a malignant process versus severe infection. CT-guided biopsy done 12/18/15. Radiation simulation done 12/17/15 but treatments have not been initiated pending pathology results. ID following for [name].  Assessment/Plan:   Principal Problems:   Lower extremity weakness/fall secondary to T7 compression fracture and direct extension of right lower lobe mass which could represent infection/CAP or malignancy, rule out discitis/osteomyelitis/[name]/decreased sensation Diagnostic uncertainty remains. Blood cultures done on admission positive for MSSA, with surveillance blood cultures negative so far. The patient underwent CT-guided core biopsy of right lower lobe/posterior mediastinal mass lesion with pathology report pending at this time. The patient will need 6 weeks of cefazolin 2 g IV every 8 hours. TEE scheduled for 12/20/15 to rule out endocarditis. CIR consulted for consideration of inpatient rehabilitation. Continue Decadron for now.    Atrial fibrillation with RVR ([place]) Patient was placed on a Cardizem drip on admission which was discontinued secondary to low blood pressure. Currently on digoxin. Remains in sinus rhythm.  Active Problems:   Essential  hypertension Does not appear to be on antihypertensives at home, and IV Cardizem dropped his blood pressure. Blood pressure currently stable on oral Cardizem, 180 mg daily.    Carotid stenosis History of CEA.    Diabetes ([employer]) Hemoglobin A1c 5.8% on 09/16/15. Mildly hyperglycemic here, likely secondary to Decadron. We'll start insulin sensitive SSI.    HLD (hyperlipidemia) Resume Lipitor.    CVA (cerebral vascular accident) ([place]) Plavix held pending biopsy. Will resume.    Urinary retention Foley catheter placed by urology.    Pressure ulcer to the sacrum Wound care consultation requested.   Family Communication/Anticipated D/C date and plan/Code Status   DVT prophylaxis: Heparin ordered. Code Status: Full Code.  Family Communication: Wife/daughter at the bedside. Disposition Plan: CIR vs. SNF when stable and plan of care further defined.   Medical Consultants:    Cardiology  Radiation Oncology  Neurosurgery  Infectious Disease  Urology   Procedures:    Venous Dopplers 12/19/15: Negative.  2-D echo 12/17/15: EF 60-65 percent. Grade 2 diastolic dysfunction.  Anti-Infectives:    Azithromycin 12/14/15 ---> 12/15/15  Rocephin 12/14/15 ---> 12/15/15  Cefazolin 12/15/15--->   Subjective:   The patient reports that he does not have much of an appetite. Review of symptoms is positive for hiccups and constipation and negative for nausea/vomiting, shortness of breath, chest pain.  Objective:    Vitals:   12/18/15 2353 12/19/15 0416 12/19/15 0753 12/19/15 0806  BP: 117/66 (!) 141/78 (!) 148/78   Pulse: (!) 56 (!) 57  67  Resp: 17 16    Temp: 97.7 F (36.5 C) 97.7 F (36.5 C) 98.5 F (36.9 C)   TempSrc: Oral Oral Axillary   SpO2: 96% 97%    Weight:  Height:        Intake/Output Summary (Last 24 hours) at 12/19/15 0811 Last data filed at 12/19/15 0645  Gross per 24 hour  Intake              560 ml  Output             1475 ml  Net              -915 ml   Filed Weights   12/16/15 0545 12/17/15 0702 12/18/15 0900  Weight: 93.4 kg (205 lb 12.8 oz) 94.4 kg (208 lb 1.6 oz) 97.1 kg (214 lb 1.6 oz)    Exam: General exam: Appears calm and comfortable.  Respiratory system: Occasional wheeze. Respiratory effort normal. Cardiovascular system: S1 & S2 heard, RRR. No JVD,  rubs, gallops or clicks. No murmurs. Gastrointestinal system: Abdomen is nondistended, soft and nontender. No organomegaly or masses felt. Normal bowel sounds heard. Central nervous system: Alert and oriented. Lower extremity paralysis. Extremities: No clubbing,  or cyanosis. No edema. Skin: Deep tissue injury to sacrum otherwise no rashes, lesions or ulcers. Psychiatry: Judgement and insight appear normal. Mood & affect appropriate.   Data Reviewed:   I have personally reviewed following labs and imaging studies:  Labs: Basic Metabolic Panel:  Recent Labs Lab 12/15/15 0351 12/16/15 0432 12/17/15 0337 12/18/15 0429 12/19/15 0356  NA 132* 134* 134* 134* 139  K 3.6 3.5 4.2 4.1 4.4  CL 96* 99* 102 101 107  CO2 28 27 24 25 26   GLUCOSE 110* 99 193* 148* 145*  BUN 16 22* 27* 27* 33*  CREATININE 0.86 0.89 0.80 0.84 0.77  CALCIUM 8.6* 8.5* 8.4* 8.1* 8.2*  MG  --  2.1 2.2 2.2 2.3  PHOS  --  3.2 3.0 3.3  --    GFR Estimated Creatinine Clearance: 93.5 mL/min (by C-G formula based on SCr of 0.77 mg/dL). Liver Function Tests:  Recent Labs Lab 12/14/15 0635 12/16/15 0432 12/17/15 0337 12/18/15 0429 12/19/15 0356  AST 44* 87* 116* 61* 69*  ALT 28 45 50 33 37  ALKPHOS 183* 146* 138* 116 114  BILITOT 0.7 0.5 0.3 0.2* 0.3  PROT 7.9 6.0* 5.9* 5.7* 5.4*  ALBUMIN 1.9* 1.5* 1.4* 1.5* 1.5*   Coagulation profile  Recent Labs Lab 12/14/15 1721  INR 1.04    CBC:  Recent Labs Lab 12/15/15 0351 12/16/15 0432 12/17/15 0337 12/18/15 0429 12/19/15 0356  WBC 13.1* 11.1* 6.5 9.2 7.6  NEUTROABS  --  9.4* 5.9 8.4* 7.0  HGB 10.5* 10.6* 11.4* 10.3* 10.4*    HCT 32.7* 33.0* 35.4* 32.0* 32.5*  MCV 93.7 93.5 93.4 92.5 92.6  PLT 275 286 321 376 359   CBG:  Recent Labs Lab 12/14/15 0638  GLUCAP 138*   Sepsis Labs:  Recent Labs Lab 12/14/15 0906 12/14/15 1151  12/16/15 0432 12/17/15 0337 12/18/15 0429 12/19/15 0356  WBC  --   --   < > 11.1* 6.5 9.2 7.6  LATICACIDVEN 1.07 1.54  --   --  1.2  --   --   < > = values in this interval not displayed.  Microbiology Recent Results (from the past 240 hour(s))  Blood culture (routine x 2)     Status: Abnormal   Collection Time: 12/14/15  8:50 AM  Result Value Ref Range Status   Specimen Description BLOOD LEFT ARM  Final   Special Requests BOTTLES DRAWN AEROBIC AND ANAEROBIC 10CC  Final   Culture  Setup Time  Final    IN BOTH AEROBIC AND ANAEROBIC BOTTLES GRAM POSITIVE COCCI IN CLUSTERS CRITICAL RESULT CALLED TO, READ BACK BY AND VERIFIED WITH: TO TO SGULLETT(PHARD) BY MCAMPBELL 12/15/2015 AT 5:55AM    Culture (A)  Final    STAPHYLOCOCCUS AUREUS SUSCEPTIBILITIES PERFORMED ON PREVIOUS CULTURE WITHIN THE LAST 5 DAYS.    Report Status 12/17/2015 FINAL  Final  Blood culture (routine x 2)     Status: Abnormal   Collection Time: 12/14/15  9:00 AM  Result Value Ref Range Status   Specimen Description BLOOD RIGHT HAND  Final   Special Requests BOTTLES DRAWN AEROBIC AND ANAEROBIC 5CC  Final   Culture  Setup Time   Final    IN BOTH AEROBIC AND ANAEROBIC BOTTLES GRAM POSITIVE COCCI IN CLUSTERS Organism ID to follow CRITICAL RESULT CALLED TO, READ BACK BY AND VERIFIED WITH: TO TO SGULLETT(PHARD) BY MCAMPBELL 12/15/2015 AT 5:55AM    Culture STAPHYLOCOCCUS AUREUS (A)  Final   Report Status 12/17/2015 FINAL  Final   Organism ID, Bacteria STAPHYLOCOCCUS AUREUS  Final      Susceptibility   Staphylococcus aureus - MIC*    CIPROFLOXACIN <=0.5 SENSITIVE Sensitive     ERYTHROMYCIN <=0.25 SENSITIVE Sensitive     GENTAMICIN <=0.5 SENSITIVE Sensitive     OXACILLIN <=0.25 SENSITIVE Sensitive      TETRACYCLINE <=1 SENSITIVE Sensitive     VANCOMYCIN <=0.5 SENSITIVE Sensitive     TRIMETH/SULFA <=10 SENSITIVE Sensitive     CLINDAMYCIN <=0.25 SENSITIVE Sensitive     RIFAMPIN <=0.5 SENSITIVE Sensitive     Inducible Clindamycin NEGATIVE Sensitive     * STAPHYLOCOCCUS AUREUS  Blood Culture ID Panel (Reflexed)     Status: Abnormal   Collection Time: 12/14/15  9:00 AM  Result Value Ref Range Status   Enterococcus species NOT DETECTED NOT DETECTED Final   Vancomycin resistance NOT DETECTED NOT DETECTED Final   Listeria monocytogenes NOT DETECTED NOT DETECTED Final   Staphylococcus species DETECTED (A) NOT DETECTED Final    Comment: CRITICAL RESULT CALLED TO, READ BACK BY AND VERIFIED WITH: TO SGULLETT(PHARD) BY MCAMPBELL 12/15/2015 AT 5:55AM    Staphylococcus aureus DETECTED (A) NOT DETECTED Final    Comment: CRITICAL RESULT CALLED TO, READ BACK BY AND VERIFIED WITH: TO SGULLETT(PHARD) BY MCAMPBELL 12/15/2015 AT 5:55AM    Methicillin resistance NOT DETECTED NOT DETECTED Final   Streptococcus species NOT DETECTED NOT DETECTED Final   Streptococcus agalactiae NOT DETECTED NOT DETECTED Final   Streptococcus pneumoniae NOT DETECTED NOT DETECTED Final   Streptococcus pyogenes NOT DETECTED NOT DETECTED Final   Acinetobacter baumannii NOT DETECTED NOT DETECTED Final   Enterobacteriaceae species NOT DETECTED NOT DETECTED Final   Enterobacter cloacae complex NOT DETECTED NOT DETECTED Final   Escherichia coli NOT DETECTED NOT DETECTED Final   Klebsiella oxytoca NOT DETECTED NOT DETECTED Final   Klebsiella pneumoniae NOT DETECTED NOT DETECTED Final   Proteus species NOT DETECTED NOT DETECTED Final   Serratia marcescens NOT DETECTED NOT DETECTED Final   Carbapenem resistance NOT DETECTED NOT DETECTED Final   Haemophilus influenzae NOT DETECTED NOT DETECTED Final   Neisseria meningitidis NOT DETECTED NOT DETECTED Final   Pseudomonas aeruginosa NOT DETECTED NOT DETECTED Final   Candida albicans  NOT DETECTED NOT DETECTED Final   Candida glabrata NOT DETECTED NOT DETECTED Final   Candida krusei NOT DETECTED NOT DETECTED Final   Candida parapsilosis NOT DETECTED NOT DETECTED Final   Candida tropicalis NOT DETECTED NOT DETECTED Final  Urine culture  Status: Abnormal   Collection Time: 12/14/15  4:22 PM  Result Value Ref Range Status   Specimen Description URINE, RANDOM  Final   Special Requests NONE  Final   Culture 20,000 COLONIES/mL STAPHYLOCOCCUS AUREUS (A)  Final   Report Status 12/17/2015 FINAL  Final   Organism ID, Bacteria STAPHYLOCOCCUS AUREUS (A)  Final      Susceptibility   Staphylococcus aureus - MIC*    CIPROFLOXACIN <=0.5 SENSITIVE Sensitive     GENTAMICIN <=0.5 SENSITIVE Sensitive     NITROFURANTOIN <=16 SENSITIVE Sensitive     OXACILLIN <=0.25 SENSITIVE Sensitive     TETRACYCLINE <=1 SENSITIVE Sensitive     VANCOMYCIN 1 SENSITIVE Sensitive     TRIMETH/SULFA <=10 SENSITIVE Sensitive     CLINDAMYCIN <=0.25 SENSITIVE Sensitive     RIFAMPIN <=0.5 SENSITIVE Sensitive     Inducible Clindamycin NEGATIVE Sensitive     * 20,000 COLONIES/mL STAPHYLOCOCCUS AUREUS  MRSA PCR Screening     Status: None   Collection Time: 12/14/15 11:57 PM  Result Value Ref Range Status   MRSA by PCR NEGATIVE NEGATIVE Final    Comment:        The GeneXpert MRSA Assay (FDA approved for NASAL specimens only), is one component of a comprehensive MRSA colonization surveillance program. It is not intended to diagnose MRSA infection nor to guide or monitor treatment for MRSA infections.   Culture, blood (Routine X 2) w Reflex to ID Panel     Status: None (Preliminary result)   Collection Time: 12/17/15  7:29 PM  Result Value Ref Range Status   Specimen Description BLOOD LEFT HAND  Final   Special Requests BOTTLES DRAWN AEROBIC ONLY 5CC  Final   Culture NO GROWTH < 24 HOURS  Final   Report Status PENDING  Incomplete  Culture, blood (Routine X 2) w Reflex to ID Panel     Status: None  (Preliminary result)   Collection Time: 12/17/15  7:45 PM  Result Value Ref Range Status   Specimen Description BLOOD LEFT ANTECUBITAL  Final   Special Requests BOTTLES DRAWN AEROBIC ONLY 5CC  Final   Culture NO GROWTH < 24 HOURS  Final   Report Status PENDING  Incomplete  Culture, expectorated sputum-assessment     Status: None   Collection Time: 12/18/15  9:44 AM  Result Value Ref Range Status   Specimen Description EXPECTORATED SPUTUM  Final   Special Requests NONE  Final   Sputum evaluation   Final    THIS SPECIMEN IS ACCEPTABLE. RESPIRATORY CULTURE REPORT TO FOLLOW.   Report Status 12/18/2015 FINAL  Final  Culture, respiratory (NON-Expectorated)     Status: None (Preliminary result)   Collection Time: 12/18/15  9:44 AM  Result Value Ref Range Status   Specimen Description EXPECTORATED SPUTUM  Final   Special Requests NONE  Final   Gram Stain   Final    ABUNDANT WBC PRESENT, PREDOMINANTLY PMN FEW BUDDING YEAST SEEN RARE GRAM POSITIVE COCCI IN PAIRS RARE GRAM NEGATIVE COCCOBACILLI    Culture PENDING  Incomplete   Report Status PENDING  Incomplete  Acid Fast Smear (AFB)     Status: None   Collection Time: 12/18/15 11:48 AM  Result Value Ref Range Status   AFB Specimen Processing Concentration  Final   Acid Fast Smear Negative  Final    Comment: (NOTE) Performed At: [facility] [address] [place], [place] [id number] Lindon Romp MD [id number]    Source (AFB) ASPIRATE  Final  Comment:  RLL POSTERIOR MEDIASTINAL MASS AT T7   Aerobic/Anaerobic Culture (surgical/deep wound)     Status: None (Preliminary result)   Collection Time: 12/18/15 11:48 AM  Result Value Ref Range Status   Specimen Description ASPIRATE  Final   Special Requests RLL POSTERIOR MEDIASTINAL MASS AT T7  Final   Gram Stain   Final    RARE WBC PRESENT, PREDOMINANTLY PMN NO ORGANISMS SEEN    Culture PENDING  Incomplete   Report Status PENDING  Incomplete    Radiology: Dg Chest 2  View  Result Date: 12/17/2015 CLINICAL DATA:  Shortness of breath, left-sided chest pain EXAM: CHEST  2 VIEW COMPARISON:  CT chest of 12/14/2015, chest x-ray of 12/14/2015, and two-view chest x-ray of 11/12/2015 FINDINGS: There is abnormal opacity overlying the right hilum and suprahilar region consistent with the soft tissue mass described on recent CT of the chest. In view of the rapid development since chest x-ray of 11/12/2015, an infectious or inflammatory process would be more likely than neoplasm. No pneumonia is seen and there is a small right pleural effusion present. The heart is within upper limits normal. Partial compression of T8 and the anterior inferior aspect of T7 is noted which could also either be infectious, inflammatory, or neoplastic in origin. IMPRESSION: 1. Abnormal opacity overlies the right hilum/suprahilar region consistent with the previously described soft tissue mass involving the posterior medial right lower lobe and thoracic vertebral column. Favor infectious or inflammatory process as noted above. 2. Small right pleural effusion. Electronically Signed   By: Ivar Drape M.D.   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 He also has new onset paraplegia. CT-guided biopsy is warranted to obtain tissue diagnosis. EXAM: CT GUIDANCE NEEDLE PLACEMENT MEDICATIONS: None. ANESTHESIA/SEDATION: Moderate (conscious) sedation was employed during this procedure. A total of Versed 2 mg and Fentanyl 100 mcg was administered intravenously. Moderate Sedation Time: 14 minutes. The patient's level of consciousness and vital signs were monitored continuously by radiology nursing throughout the procedure under my direct supervision. FLUOROSCOPY TIME:  Fluoroscopy Time: 0 minutes 0 seconds (0 mGy). COMPLICATIONS: None immediate.  PROCEDURE: Informed written consent was obtained from the patient after a thorough discussion of the procedural risks, benefits and alternatives. All questions were addressed. Maximal Sterile Barrier Technique was utilized including caps, mask, sterile gowns, sterile gloves, sterile drape, hand hygiene and skin antiseptic. A timeout was performed prior to the initiation of the procedure. A planning axial CT scan was performed. The infiltrative mass along the medial aspect of the right lower lobe directly invading the posterior mediastinum was successfully identified. A suitable skin entry site was selected and marked. The region was sterilely prepped and draped in the standard fashion using chlorhexidine skin prep. Local anesthesia was attained by infiltration with 1% lidocaine. A small dermatotomy was made. Under intermittent CT guidance, a 17 gauge introducer needle was advanced into the margin of the soft tissue mass. Multiple 18 gauge core biopsies were then coaxially obtained using the bio Pince automated biopsy device. Biopsy specimens were placed in saline and delivered to pathology for further analysis. Additional 20 gauge needle aspirate was performed and inject into a small amount of sterile saline for cultures. Post biopsy axial CT imaging demonstrates no evidence of pneumothorax, hemorrhage or other complicating feature. IMPRESSION: Technically successful CT-guided core biopsy of right lower lobe/ posterior  mediastinal mass lesion. Signed, Criselda Peaches, MD [facility] Radiology Electronically Signed   By: Jacqulynn Cadet M.D.   On: 12/18/2015 12:58    Medications:   .  ceFAZolin (ANCEF) IV  2 g Intravenous Q8H  . dexamethasone  10 mg Intravenous Q6H  . digoxin  0.25 mg Oral Daily  . diltiazem  180 mg Oral Daily  . heparin subcutaneous  5,000 Units Subcutaneous Q8H  . LORazepam  0.5 mg Oral Daily  . polyethylene glycol  17 g Oral Daily    . senna-docusate  1 tablet Oral BID  . timolol  1 drop Both Eyes BID   Continuous Infusions:   Medical decision making is of high complexity and this patient is at high risk of deterioration, therefore this is a level 3 visit.    LOS: 5 days   [facility] Pager [phone]. If unable to reach me by pager, please call my cell phone at [phone].  *Please refer to [email or web address], password [email or web address] to get updated schedule on who will round on this patient, as hospitalists switch teams weekly. If 7PM-7AM, please contact night-coverage at [email or web address], password [email or web address] for any overnight needs.  12/19/2015, 8:11 AM

## 2015-12-19 NOTE — Progress Notes (Signed)
[facility] has been requested to perform a transesophageal echocardiogram on 12/20/15 for bacteremia.  After careful review of history and examination, the risks and benefits of transesophageal echocardiogram have been explained including risks of esophageal damage, perforation (1:10,000 risk), bleeding, pharyngeal hematoma as well as other potential complications associated with conscious sedation including aspiration, arrhythmia, respiratory failure and death. Alternatives to treatment were discussed, questions were answered. Patient is willing to proceed.  TEE - Dr. Meda Coffee  @ noon . NPO after midnight. Meds with sips ok  Arbutus Leas, NP 12/19/2015 3:13 PM

## 2015-12-19 NOTE — Progress Notes (Signed)
[facility]  CONSULT SERVICE NOTE  Pt rolling down the hall for a test when I arrived. Multiple active medical issues at present. Appears to be a candidate for inpatient rehab pending med/onc plan. Will follow up tomorrow for formal consult.   Meredith Staggers, MD, [facility] 12/19/2015

## 2015-12-19 NOTE — Consult Note (Signed)
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 right handed male with history of urethral stricture, TIA on Plavix, carotid stenosis status post CEA, hypertension, diabetes mellitus, tremor.Per chart review patient lives with spouse. One level home. Wife works during the day. Patient works full-time as a used Barrister's clerk.  He presented 12/14/2015 with generalized weakness failure to thrive after recent fall.Patient found to be in atrial fibrillation with RVR.  Initial chest x-ray evaluation revealed community-acquired pneumonia as well as findings of right lower lobe mass, closed fracture of T7 vertebrae. CT of the chest showed rounded masslike consolidation in the medial aspect of the superior segment right lower lobe measuring 4.7 x 4.9 cm new from 07/07/2013. MRI thoracic spine showed abnormal segment and contrast enhancement of T6 and T7 vertebral bodies with associated T7 compression fracturewith associated discitis -osteomyelitis  most likely secondary to direct extension of the process within the right lower lobe. Cranial CT scan negative for acute changes. Follow-up neurosurgery Dr. Annette Stable with review of MRI thoracic spine no plan for surgical intervention at this time. CT-guided biopsy performed 12/18/2015 with results pending. Radiation oncology consulted in regards to findings of mass plan to begin simulation and radiation treatment.Infectious disease consulted for [name] with osteomyelitis. Echocardiogram without any vegetation. Await plan for TEE. Currently remains on Cefazolin. Cardiology continues to follow for atrial fibrillation with RVR status post conversion normal sinus rhythm after being maintained on Cardizem drip. Plan anticoagulation once all invasive procedures  are completed.   Review of Systems  Constitutional: Positive for malaise/fatigue. Negative for chills and fever.  HENT: Negative for hearing loss.   Eyes: Negative for blurred vision and double vision.  Respiratory: Positive for cough. Negative for shortness of breath.   Cardiovascular: Negative for chest pain, palpitations and leg swelling.  Gastrointestinal: Positive for constipation. Negative for nausea and vomiting.  Genitourinary: Positive for frequency and urgency.  Musculoskeletal: Positive for back pain and myalgias.  Skin: Negative for rash.  Neurological: Positive for weakness. Negative for seizures and headaches.  All other systems reviewed and are negative.  Past Medical History:  Diagnosis Date  . Anxiety state, unspecified   . Calculus of kidney   . 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 Complication of anesthesia    " I woke up in the recovery room with tube in throat and panicked."  . 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 Herpes zoster without mention of complication   . History of hiatal hernia   . Hypertrophy of prostate with urinary obstruction and other lower urinary tract symptoms (LUTS)   . Insomnia, unspecified   . Lumbago   . Other and unspecified hyperlipidemia   . Pneumonia    bronchial  . Reflux esophagitis   . Seasonal allergies   . Stroke ([place])   . TIA (transient ischemic attack)   . Type II or unspecified type diabetes mellitus without mention of complication, not stated as uncontrolled   . Unspecified essential hypertension   . Unspecified vitamin D deficiency   . Urethral stricture unspecified   . Urinary frequency    Past Surgical History:  Procedure Laterality Date  . CARDIAC CATHETERIZATION  1986   normal, Dr Lia Foyer  . ENDARTERECTOMY Left 06/08/2014   Procedure: LEFT CAROTID ARTERY ENDARTERECTOMY;  Surgeon: Sherren Mocha  Katina Dung, MD;  Location: [facility];  Service: Vascular;  Laterality: Left;  . EYE SURGERY  2014   cataract  . EYE  SURGERY  2013   Retina  . PATCH ANGIOPLASTY Left 06/08/2014   Procedure: WITH DACRON PATCH ANGIOPLASTY;  Surgeon: Rosetta Posner, MD;  Location: [facility];  Service: Vascular;  Laterality: Left;  . SPINAL FUSION  12/91,11/91   obtained bone from left lower leg  . stimulator 1991     spine  . stress thallium  1994    normal;  . stretching bladder  07/2007   Dr Risa Grill   Family History  Problem Relation Age of Onset  . Diabetes Mother   . Cancer Mother   . Hypertension Father   . Stroke Father   . Alcohol abuse Brother    Social History:  reports that he quit smoking about 24 years ago. His smoking use included Cigarettes. He has never used smokeless tobacco. He reports that he does not drink alcohol or use drugs. Allergies:  Allergies  Allergen Reactions  . Codeine Other (See Comments)    constipation  . Demerol [Meperidine] Other (See Comments)    Drops blood pressure   . Contrast Media [Iodinated Diagnostic Agents] Nausea And Vomiting  . Gabapentin Other (See Comments)    Suicidal, depression  . Other Other (See Comments)    All narcotics make pt hallucinate   Medications Prior to Admission  Medication Sig Dispense Refill  . atorvastatin (LIPITOR) 40 MG tablet TAKE ONE TABLET BY MOUTH ONCE DAILY FOR CHOLESTEROL (Patient taking differently: TAKE ONE TABLET BY MOUTH ONCE DAILY AT BEDTIME FOR CHOLESTEROL) 30 tablet 3  . Cholecalciferol (VITAMIN D3) 5000 units TABS Take 5,000 Units by mouth at bedtime.    . clopidogrel (PLAVIX) 75 MG tablet TAKE 1 TABLET BY MOUTH DAILY 90 tablet 3  . 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 HYDROcodone-acetaminophen (NORCO/VICODIN) 5-325 MG tablet Take 0.5-1 tablets by mouth every 6 (six) hours as needed for moderate pain.     Marland Kitchen LORazepam  (ATIVAN) 0.5 MG tablet Take 0.5 mg by mouth See admin instructions. Take 1 tablet (0.5 mg) by mouth every morning, may also take 1 tablet 2 more times during the day as needed for anxiety  0  . nicotine polacrilex (COMMIT) 2 MG lozenge Take 2 mg by mouth every 2 (two) hours.     . ondansetron (ZOFRAN) 4 MG tablet Take 1 tablet (4 mg total) by mouth every 8 (eight) hours as needed for nausea or vomiting. 20 tablet 0  . timolol (BETIMOL) 0.5 % ophthalmic solution Place 1 drop into both eyes 2 (two) times daily.    Marland Kitchen trimethoprim (TRIMPEX) 100 MG tablet Take 100 mg by mouth daily.    Marland Kitchen acetaminophen (TYLENOL) 500 MG tablet Take 1,000 mg by mouth every 6 (six) hours as needed for mild pain or moderate pain.    . methocarbamol (ROBAXIN) 500 MG tablet Take 1 tablet (500 mg total) by mouth every 6 (six) hours as needed for muscle spasms. (Patient not taking: Reported on 12/14/2015) 120 tablet 0  . traMADol (ULTRAM) 50 MG tablet Take 1 tablet (50 mg total) by mouth every 6 (six) hours as needed. 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 Pt needing manual assist and cues for proper technique and hand placement.  Transfers General transfer comment: not attempted secondary to safety      ADL: ADL Overall ADL's : Needs assistance/impaired Lower Body Dressing: Total assistance General ADL Comments: Total A to don footwear. Pt needing +2 assist for pt and therapist safety. Transfers not attempted this session. Pt with increased pain with pt movement.  Cognition: Cognition Overall Cognitive Status: Impaired/Different from baseline Orientation Level: Oriented to person, Disoriented to place, Disoriented to time, Disoriented to situation Cognition Arousal/Alertness: Awake/alert Behavior During Therapy: WFL for tasks assessed/performed Overall Cognitive Status: Impaired/Different from baseline Area of Impairment: Memory, Orientation Orientation Level: Time Memory: Decreased short-term memory General Comments: Cognitive deficits are likely due to sedatives and pain meds from bx earlier today.  Daughters report normally his cognition is fine.   Blood pressure (!) 141/78, pulse (!) 57, temperature 97.7 F (36.5 C), temperature source Oral, resp. rate 16, height 5' 7.5" (1.715 m), weight 97.1 kg (214 lb 1.6 oz), SpO2 97 %. Physical Exam  HENT:  Head: Normocephalic.  Eyes: EOM are normal.  Neck: Normal range of motion. Neck supple. No thyromegaly present.  Cardiovascular: Normal rate and regular rhythm.   Respiratory: Effort normal and breath sounds normal. No respiratory distress.  GI: Soft. Bowel sounds are normal. He exhibits no distension.  Neurological: He is alert.  Oriented to person, place and time. He does show some mild delay in processing. Follows simple commands. T6 sensory level. Has minimal LT below level. I saw no voluntary muscle movement in the LE's. Toes up bilaterally. DTR's 3+ patella/achilles bilaterally. 1/4 resting extensor tone in LE's. UE  strength 4 to 4+/5. UE sensation normal.  Psychiatric: He has a normal mood and affect. His behavior is normal.    Results for orders placed or performed during the hospital encounter of 12/14/15 (from the past 24 hour(s))  Culture, expectorated sputum-assessment     Status: None   Collection Time: 12/18/15  9:44 AM  Result Value Ref Range   Specimen Description EXPECTORATED SPUTUM    Special Requests NONE    Sputum evaluation      THIS SPECIMEN IS ACCEPTABLE. RESPIRATORY CULTURE REPORT TO FOLLOW.   Report Status 12/18/2015 FINAL   Culture, respiratory (NON-Expectorated)     Status: None (Preliminary result)   Collection Time: 12/18/15  9:44 AM  Result Value Ref Range   Specimen Description EXPECTORATED SPUTUM    Special Requests NONE    Gram Stain      ABUNDANT WBC PRESENT, PREDOMINANTLY PMN FEW BUDDING YEAST SEEN RARE GRAM POSITIVE COCCI IN PAIRS RARE GRAM NEGATIVE COCCOBACILLI    Culture PENDING  Report Status PENDING   Aerobic/Anaerobic Culture (surgical/deep wound)     Status: None (Preliminary result)   Collection Time: 12/18/15 11:48 AM  Result Value Ref Range   Specimen Description ASPIRATE    Special Requests RLL POSTERIOR MEDIASTINAL MASS AT T7    Gram Stain      RARE WBC PRESENT, PREDOMINANTLY PMN NO ORGANISMS SEEN    Culture PENDING    Report Status PENDING   Digoxin level     Status: None   Collection Time: 12/19/15  3:56 AM  Result Value Ref Range   Digoxin Level 0.9 0.8 - 2.0 ng/mL  CBC with Differential/Platelet     Status: Abnormal   Collection Time: 12/19/15  3:56 AM  Result Value Ref Range   WBC 7.6 4.0 - 10.5 K/uL   RBC 3.51 (L) 4.22 - 5.81 MIL/uL   Hemoglobin 10.4 (L) 13.0 - 17.0 g/dL   HCT 32.5 (L) 39.0 - 52.0 %   MCV 92.6 78.0 - 100.0 fL   MCH 29.6 26.0 - 34.0 pg   MCHC 32.0 30.0 - 36.0 g/dL   RDW 14.0 11.5 - 15.5 %   Platelets 359 150 - 400 K/uL   Neutrophils Relative % 92 %   Neutro Abs 7.0 1.7 - 7.7 K/uL   Lymphocytes Relative 6 %    Lymphs Abs 0.5 (L) 0.7 - 4.0 K/uL   Monocytes Relative 2 %   Monocytes Absolute 0.1 0.1 - 1.0 K/uL   Eosinophils Relative 0 %   Eosinophils Absolute 0.0 0.0 - 0.7 K/uL   Basophils Relative 0 %   Basophils Absolute 0.0 0.0 - 0.1 K/uL  Comprehensive metabolic panel     Status: Abnormal   Collection Time: 12/19/15  3:56 AM  Result Value Ref Range   Sodium 139 135 - 145 mmol/L   Potassium 4.4 3.5 - 5.1 mmol/L   Chloride 107 101 - 111 mmol/L   CO2 26 22 - 32 mmol/L   Glucose, Bld 145 (H) 65 - 99 mg/dL   BUN 33 (H) 6 - 20 mg/dL   Creatinine, Ser 0.77 0.61 - 1.24 mg/dL   Calcium 8.2 (L) 8.9 - 10.3 mg/dL   Total Protein 5.4 (L) 6.5 - 8.1 g/dL   Albumin 1.5 (L) 3.5 - 5.0 g/dL   AST 69 (H) 15 - 41 U/L   ALT 37 17 - 63 U/L   Alkaline Phosphatase 114 38 - 126 U/L   Total Bilirubin 0.3 0.3 - 1.2 mg/dL   GFR calc non Af Amer >60 >60 mL/min   GFR calc Af Amer >60 >60 mL/min   Anion gap 6 5 - 15  Magnesium     Status: None   Collection Time: 12/19/15  3:56 AM  Result Value Ref Range   Magnesium 2.3 1.7 - 2.4 mg/dL   Dg Chest 2 View  Result Date: 12/17/2015 CLINICAL DATA:  Shortness of breath, left-sided chest pain EXAM: CHEST  2 VIEW COMPARISON:  CT chest of 12/14/2015, chest x-ray of 12/14/2015, and two-view chest x-ray of 11/12/2015 FINDINGS: There is abnormal opacity overlying the right hilum and suprahilar region consistent with the soft tissue mass described on recent CT of the chest. In view of the rapid development since chest x-ray of 11/12/2015, an infectious or inflammatory process would be more likely than neoplasm. No pneumonia is seen and there is a small right pleural effusion present. The heart is within upper limits normal. Partial compression of T8 and the anterior inferior aspect  of T7 is noted which could also either be infectious, inflammatory, or neoplastic in origin. IMPRESSION: 1. Abnormal opacity overlies the right hilum/suprahilar region consistent with the previously  described soft tissue mass involving the posterior medial right lower lobe and thoracic vertebral column. Favor infectious or inflammatory process as noted above. 2. Small right pleural effusion. Electronically Signed   By: Ivar Drape M.D.   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 He also has new onset paraplegia. CT-guided biopsy is warranted to obtain tissue diagnosis. EXAM: CT GUIDANCE NEEDLE PLACEMENT MEDICATIONS: None. ANESTHESIA/SEDATION: Moderate (conscious) sedation was employed during this procedure. A total of Versed 2 mg and Fentanyl 100 mcg was administered intravenously. Moderate Sedation Time: 14 minutes. The patient's level of consciousness and vital signs were monitored continuously by radiology nursing throughout the procedure under my direct supervision. FLUOROSCOPY TIME:  Fluoroscopy Time: 0 minutes 0 seconds (0 mGy). COMPLICATIONS: None immediate. PROCEDURE: Informed written consent was obtained from the patient after a thorough discussion of the procedural risks, benefits and alternatives. All questions were addressed. Maximal Sterile Barrier Technique was utilized including caps, mask, sterile gowns, sterile gloves, sterile drape, hand hygiene and skin antiseptic. A timeout was performed prior to the initiation of the procedure. A planning axial CT scan was performed. The infiltrative mass along the medial aspect of the right lower lobe directly invading the posterior mediastinum was successfully identified. A suitable skin entry site was selected and marked. The region was sterilely prepped and draped in the standard fashion using chlorhexidine skin prep. Local anesthesia was attained by infiltration with 1% lidocaine. A small dermatotomy was made. Under intermittent CT guidance, a 17 gauge introducer needle was advanced  into the margin of the soft tissue mass. Multiple 18 gauge core biopsies were then coaxially obtained using the bio Pince automated biopsy device. Biopsy specimens were placed in saline and delivered to pathology for further analysis. Additional 20 gauge needle aspirate was performed and inject into a small amount of sterile saline for cultures. Post biopsy axial CT imaging demonstrates no evidence of pneumothorax, hemorrhage or other complicating feature. IMPRESSION: Technically successful CT-guided core biopsy of right lower lobe/ posterior mediastinal mass lesion. Signed, Criselda Peaches, MD [facility] Electronically Signed   By: Jacqulynn Cadet M.D.   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 Does the need for close, 24 hr/day medical supervision in concert with the patient's rehab needs make it unreasonable for this patient to be served in a less intensive setting? Yes 2. Co-Morbidities requiring supervision/potential complications: CAP, afib, pain 3. Due to bladder management, bowel management, safety, skin/wound care, disease management, medication administration, pain management and patient education, does the patient require 24 hr/day rehab nursing? Yes 4. Does the patient require coordinated care of a physician, rehab nurse, PT (1-2 hrs/day, 5 days/week) and OT (1-2 hrs/day, 5 days/week) to address physical and functional deficits in the context of the above medical diagnosis(es)? Yes Addressing deficits in the following areas: balance, endurance, locomotion, strength, transferring, bowel/bladder control, bathing, dressing, feeding, grooming, toileting, cognition and psychosocial support 5. Can the patient actively participate in an intensive therapy program of at least 3 hrs of therapy per day at least 5 days  per week? Yes  6. The potential for patient to make measurable gains while on inpatient rehab is excellent 7. Anticipated functional outcomes upon discharge from inpatient rehab are supervision and min assist  with PT, supervision and min assist with OT, n/a with SLP. 8. Estimated rehab length of stay to reach the above functional goals is: 18-22 days 9. Does the patient have adequate social supports and living environment to accommodate these discharge functional goals? Yes 10. Anticipated D/C setting: Home 11. Anticipated post D/C treatments: [place] therapy 12. Overall Rehab/Functional Prognosis: excellent  RECOMMENDATIONS: This patient's condition is appropriate for continued rehabilitative care in the following setting: CIR Patient has agreed to participate in recommended program. Yes Note that insurance prior authorization may be required for reimbursement for recommended care.  Comment: Medical/onc work up ongoing. Pt was active and working full time prior to this hospitalization. Rehab Admissions Coordinator to follow up.  Thanks,  Meredith Staggers, MD, Mellody Drown     12/19/2015

## 2015-12-19 NOTE — Progress Notes (Signed)
I have spoken with Dr. Orene Desanctis in pathology, and we do not have tissue diagnosis to suggest malignancy at this time. IHC is pending and we will determine radiotherapy as additional pathology becomes available.    Carola Rhine, PAC

## 2015-12-19 NOTE — Care Management Note (Addendum)
Case Management Note  Patient Details  Name: Marcus [name] MRN: [id number] Date of Birth: 01-08-1941  Subjective/Objective:   Pt presented with worsening Malaise-recent fall at home. Pt with AFib RVR Initiated on Cardizem gtt. RLL lung mass-[name]. Pt is on Cefazolin IV. CIR consult placed.                   Action/Plan: CSW monitoring for SNF placement as well. CM will continue to monitor for additional needs.   Expected Discharge Date:                  Expected Discharge Plan:  Skilled Nursing Facility  In-House Referral:  Clinical Social Work  Discharge planning Services  CM Consult  Post Acute Care Choice:   N/A Choice offered to:   N/A  DME Arranged:   N/A DME Agency:   N/A  HH Arranged:   N/A HH Agency:   N/A  Status of Service: Completed.  If discussed at [place] of Stay Meetings, dates discussed:  12-19-15, 12-24-15  Additional Comments: 12-24-15 Marcus Hill, RN,BSN [id number] Plan will be to d/c to CIR 12-24-15. No further needs from CM at this time.    12-23-15 [place], [place] [phone] PICC Line placed 12-21-15 TEE completed 12-20-15 negative for endocarditis. CIR consulted- hopefully plan for 12-24-15. IV decadron continues. CM will continue to monitor.   Marcus Roys, RN 12/19/2015, 3:55 PM

## 2015-12-19 NOTE — Progress Notes (Signed)
TEE likely Monday due to scheduling issue. Will need updated orders once on schedule.

## 2015-12-19 NOTE — Progress Notes (Signed)
VASCULAR LAB PRELIMINARY  PRELIMINARY  PRELIMINARY  PRELIMINARY  Bilateral lower extremity venous duplex completed.    Preliminary report:  There is no DVT or SVT noted in the bilateral lower extremities.   Refoel Palladino, RVT 12/19/2015, 11:12 AM

## 2015-12-19 NOTE — Progress Notes (Signed)
Physical Therapy Treatment Patient Details Name: Marcus [name] MRN: [id number] DOB: 07/06/1940 Today's Date: 12/19/2015    History of Present Illness 75 y.o. male admitted to [facility] on 12/14/15 for LE weakness after fall, pain.  Initial eval revealed CAP, L lower lobe mass (bx on 12/18/15), and closed fx of T7 vertebrae (resultant weakness and loss of sensation from T7 down), urinary retention, A-fib with RVR.  Neurosurgeon consulted and pt not thought to be a surgical candidate.  Pt with significant PMhx of urethral stricture, DM, TIA, stroke, low back pain, essential tremor, anxiety, and spinal fusion surgery.    PT Comments    Pt is progressing well with his mobility.  He was less scared EOB today and was able to help more with his transitions.  He was able to slide board transfer into the recliner chair with two person assist with plans to try to get into a WC and start [place] mobility training tomorrow.  PT to follow acutely.   Follow Up Recommendations  CIR     Equipment Recommendations  Wheelchair (measurements PT);Wheelchair cushion (measurements PT);3in1 (PT);Other (comment) (20x18 WC, drop arm 3-in-1, tub transfer bench, slide board)    Recommendations for Other Services Rehab consult     Precautions / Restrictions Precautions Precautions: Fall Precaution Comments: h/o fall, now with inability to move legs and poor trunk control Restrictions Weight Bearing Restrictions: No    Mobility  Bed Mobility Overal bed mobility: +2 for physical assistance;Needs Assistance Bed Mobility: Sidelying to Sit Rolling: +2 for physical assistance;Max assist Sidelying to sit: +2 for physical assistance;Max assist;HOB elevated     Sit to sidelying: +2 for physical assistance;Max assist General bed mobility comments: Two person max assist, I feel pt was using his arms more today than yesterday to help with transitions and we left HOB a bit higher as well.   Transfers Overall transfer level:  Needs assistance Equipment used:  (slide board) Transfers: Lateral/Scoot Transfers          Lateral/Scoot Transfers: With slide board;From elevated surface;+2 physical assistance;Max assist General transfer comment: Two person max assist to slide board transfer into drop arm recliner chair.  Verbal cues to pt for hand placement, one therapist in front one behind helping to control movement of body over slide baord.  Pt was helping some with his arms (right hand reaching and pulling and left arm holding onto front therapist).          Balance Overall balance assessment: Needs assistance Sitting-balance support: Feet supported;Bilateral upper extremity supported Sitting balance-Leahy Scale: Poor Sitting balance - Comments: He seemed to be less afraid EOB today and did not immediately ask for support behind him.  Verbal cues for safe hand placement to keep his balance EOB.  Close supervision needed EOB.  Postural control: Posterior lean                          Cognition Arousal/Alertness: Awake/alert Behavior During Therapy: WFL for tasks assessed/performed Overall Cognitive Status: Impaired/Different from baseline Area of Impairment: Memory     Memory: Decreased short-term memory         General Comments: Pt did not remember sitting up with me yesterday.        General Comments General comments (skin integrity, edema, etc.): Pt seems to be having some reflexive movement in his feet when they are touched, but he cannot activate them actively.       Pertinent Vitals/Pain Pain Assessment:  Faces Faces Pain Scale: Hurts even more Pain Location: generalized, trunk Pain Descriptors / Indicators: Grimacing;Guarding Pain Intervention(s): Limited activity within patient's tolerance;Monitored during session;Premedicated before session;Repositioned           PT Goals (current goals can now be found in the care plan section) Acute Rehab PT Goals Patient Stated Goal:  to go home Progress towards PT goals: Progressing toward goals    Frequency    Min 5X/week      PT Plan Current plan remains appropriate       End of Session Equipment Utilized During Treatment: Gait belt Activity Tolerance: Patient limited by pain;Patient limited by fatigue Patient left: in chair;with call bell/phone within reach;with family/visitor present     Time: [id number] PT Time Calculation (min) (ACUTE ONLY): 46 min  Charges:  $Therapeutic Activity: 38-52 mins                      Thorvald Orsino B. Acelynn Dejonge, PT, DPT [phone]   12/19/2015, 3:31 PM

## 2015-12-20 ENCOUNTER — Inpatient Hospital Stay (HOSPITAL_COMMUNITY): Payer: Medicare Other

## 2015-12-20 ENCOUNTER — Encounter (HOSPITAL_COMMUNITY): Payer: Self-pay | Admitting: *Deleted

## 2015-12-20 ENCOUNTER — Ambulatory Visit: Payer: Medicare Other

## 2015-12-20 ENCOUNTER — Encounter (HOSPITAL_COMMUNITY)
Admission: EM | Disposition: A | Discharge status: Rehabilitation Facility | Payer: Self-pay | Source: Home / Self Care | Attending: Internal Medicine

## 2015-12-20 DIAGNOSIS — I340 Nonrheumatic mitral (valve) insufficiency: Secondary | ICD-10-CM

## 2015-12-20 HISTORY — PX: TEE WITHOUT CARDIOVERSION: SHX5443

## 2015-12-20 LAB — GLUCOSE, CAPILLARY
GLUCOSE-CAPILLARY: 141 mg/dL — AB (ref 65–99)
Glucose-Capillary: 132 mg/dL — ABNORMAL HIGH (ref 65–99)

## 2015-12-20 SURGERY — ECHOCARDIOGRAM, TRANSESOPHAGEAL
Anesthesia: Moderate Sedation

## 2015-12-20 MED ORDER — ENOXAPARIN SODIUM 40 MG/0.4ML ~~LOC~~ SOLN
40.0000 mg | SUBCUTANEOUS | Status: DC
  Administered 2015-12-20 – 2015-12-21 (×2): 40 mg via SUBCUTANEOUS
  Filled 2015-12-20 (×2): qty 0.4

## 2015-12-20 MED ORDER — MIDAZOLAM HCL 5 MG/ML IJ SOLN
INTRAMUSCULAR | Status: AC
  Filled 2015-12-20: qty 2

## 2015-12-20 MED ORDER — FENTANYL CITRATE (PF) 100 MCG/2ML IJ SOLN
INTRAMUSCULAR | Status: AC
  Filled 2015-12-20: qty 2

## 2015-12-20 MED ORDER — BUTAMBEN-TETRACAINE-BENZOCAINE 2-2-14 % EX AERO
INHALATION_SPRAY | CUTANEOUS | Status: DC | PRN
  Administered 2015-12-20: 2 via TOPICAL

## 2015-12-20 MED ORDER — MIDAZOLAM HCL 10 MG/2ML IJ SOLN
INTRAMUSCULAR | Status: DC | PRN
  Administered 2015-12-20 (×2): 1 mg via INTRAVENOUS
  Administered 2015-12-20: 2 mg via INTRAVENOUS

## 2015-12-20 MED ORDER — FENTANYL CITRATE (PF) 100 MCG/2ML IJ SOLN
INTRAMUSCULAR | Status: DC | PRN
  Administered 2015-12-20: 25 ug via INTRAVENOUS

## 2015-12-20 NOTE — Consult Note (Addendum)
[place] Nurse wound consult note Reason for Consult:Consult requested for sacrum and bilat buttocks.  Family at bedside to assess wound appearance and discuss etiology.  They state patient fell at home prior to admission. Wound type: Inner gluteal fold with deep tissue injury which has a blood-filled blister which ruptured when assessed; revealing moist dark purple wound bed with mod amt dark brown drainage, no odor. 3X1X.1cm.  Bilat buttocks surrounding the gluteal fold with dark purple-reddish deep tissue injury areas which have not evolved at this time; affected area is 10X10cm.  Pressure Ulcer POA: Yes Dressing procedure/placement/frequency: Discussed with patient and family members that deep tissue injuries may evolve into full thickness wounds despite optimal plan of care., and importance of turning off the affected area as much as possible.  Foam dressing to protect and absorb drainage.  Air mattress ordered to reduce pressure to the locations, and pressure redistribution cushion ordered for when OOB to chair.  Pt should limit time in chair with therapy to an hour or less to reduce prolonged pressure over the affected areas. Discussed plan of care with family and they verbalize understanding. Please re-consult if further assistance is needed.  Thank-you,  Julien Girt MSN, [place], [place], [place], [place]

## 2015-12-20 NOTE — Progress Notes (Signed)
  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 His work up on admission also found to have lung mass vs abscess in the setting of [name]. He has aspirate of RLL mass that showed a few staph aureus on cx but still concern that his presentation of lung mass plus lytic thoracic lesion was due to malignancy. Path still pending on lung aspirate to decide if patient is candidate for radiation to spine. From ID standpoint, I don't think he has much more studies. Recommend 6 wk of IV cefazolin.    Call if questions,      [place] Antimicrobial Management Team Staphylococcus aureus bacteremia   Staphylococcus aureus bacteremia (SAB) is associated with a high rate of complications and mortality.  Specific aspects of clinical management are critical to optimizing the outcome of patients with SAB.  Therefore, the [facility] Antimicrobial Management Team [facility]) has initiated an intervention aimed at improving the management of SAB at [facility].  To do so, Infectious Diseases physicians are providing an evidence-based consult for the management of all patients with SAB.     Yes No Comments  Perform follow-up blood cultures (even if the patient is afebrile) to ensure clearance of bacteremia [x]  []  10/17       Perform echocardiography to evaluate for endocarditis (transthoracic ECHO is 40-50% sensitive, TEE is > 90% sensitive) [x]  []  TEE negative       Ensure source control [x]  []  Have all abscesses been drained effectively? Have deep seeded infections (septic joints or osteomyelitis) had appropriate surgical debridement?  Investigate for "metastatic" sites of infection [x]  []  MRI spine  Change antibiotic therapy to _____cefazolin 2gm iv q8hr___ []  []  Beta-lactam antibiotics are preferred for MSSA due to higher cure rates.   If on Vancomycin, goal trough should be 15 - 20 mcg/mL  Estimated  duration of IV antibiotic therapy:  6 wk, using 10/17 as day 1 []  []  Consult case management for probably prolonged outpatient IV antibiotic therapy

## 2015-12-20 NOTE — Progress Notes (Signed)
  Echocardiogram Echocardiogram Transesophageal has been performed.  Marcus [name] 12/20/2015, 2:04 PM

## 2015-12-20 NOTE — CV Procedure (Signed)
   Transesophageal Echocardiogram Note  ANTHONIE SCHIESSER [id number] 1941/02/19  Procedure: Transesophageal Echocardiogram Indications: [name]  Procedure Details Consent: Obtained Time Out: Verified patient identification, verified procedure, site/side was marked, verified correct patient position, special equipment/implants available, Radiology Safety Procedures followed,  medications/allergies/relevent history reviewed, required imaging and test results available.  Performed  Medications: Fentanyl: 25 mcg  Versed: 4 mg  - Left ventricle: Systolic function was normal. The estimated   ejection fraction was in the range of 60% to 65%. Wall motion was   normal; there were no regional wall motion abnormalities. - Aortic valve: Structurally normal valve. Trileaflet; normal   thickness leaflets. There was no regurgitation. - Aorta: The aorta was normal, not dilated, with mild   atherosclerotic plaque. - Ascending aorta: The ascending aorta was normal in size. - Mitral valve: Mildly thickened leaflets . There was mild   regurgitation. - Left atrium: The atrium was dilated. No evidence of thrombus in   the atrial cavity or appendage. No evidence of thrombus in the   atrial cavity or appendage. No evidence of thrombus in the atrial   cavity or appendage. The appendage was morphologically a left   appendage, multilobulated, and of normal size. Emptying velocity   was normal. - Right ventricle: Systolic function was normal. - Right atrium: No evidence of thrombus in the atrial cavity or   appendage. - Atrial septum: Aneurysmal interatrial septum. Negative bubble   study. No defect or patent foramen ovale was identified. - Pericardium, extracardiac: There was no pericardial effusion. - Impressions: No evidence of endocarditis.   Aneurysmal interatrial septum. Negative bubble study.  Impressions:  - No evidence of endocarditis.   Aneurysmal interatrial septum. Negative bubble  study.  Complications: No apparent complications Patient did tolerate procedure well.  Ena Dawley, MD, [facility] 12/20/2015, 11:12 AM

## 2015-12-20 NOTE — H&P (View-Only) (Signed)
Patient Name: Marcus [name] Date of Encounter: 12/20/2015  Primary Cardiologist: Kate Sable (new)  Hospital Problem List     Principal Problem:   Atrial fibrillation with RVR [facility]) Active Problems:   Essential hypertension   Carotid stenosis   Diabetes ([place])   HLD (hyperlipidemia)   CVA (cerebral vascular accident) ([place])   Fall   CAP (community acquired pneumonia)   Compression fracture of vertebra ([place])   Urinary retention   New onset a-fib ([facility])   Lung mass   Lower extremity weakness   Bacteremia   T7 vertebral fracture ([facility])   Decreased sensation   Closed fracture of seventh thoracic vertebra ([facility])   Pressure injury of skin    Subjective   Feeling well this AM.  Denies chest pain or shortness of breath. Able to move his toes some.  Inpatient Medications    Scheduled Meds: . atorvastatin  40 mg Oral q1800  .  ceFAZolin (ANCEF) IV  2 g Intravenous Q8H  . cholecalciferol  5,000 Units Oral QHS  . clopidogrel  75 mg Oral Daily  . dexamethasone  10 mg Intravenous Q6H  . digoxin  0.25 mg Oral Daily  . diltiazem  180 mg Oral Daily  . DULoxetine  30 mg Oral Q supper  . enoxaparin (LOVENOX) injection  40 mg Subcutaneous Q24H  . insulin aspart  0-9 Units Subcutaneous Q6H  . LORazepam  0.5 mg Oral Daily  . polyethylene glycol  17 g Oral Daily  . senna-docusate  1 tablet Oral BID  . timolol  1 drop Both Eyes BID   Continuous Infusions: . sodium chloride    . sodium chloride     PRN Meds: acetaminophen, guaiFENesin-dextromethorphan, HYDROcodone-acetaminophen, ipratropium-albuterol, LORazepam, morphine injection, ondansetron (ZOFRAN) IV   Vital Signs    Vitals:   12/20/15 0044 12/20/15 0400 12/20/15 0808 12/20/15 0820  BP: (!) 153/77 (!) 158/94 (!) 171/79   Pulse: 65 66    Resp: 13 15 18    Temp: 98.2 F (36.8 C) 97.7 F (36.5 C)  97.6 F (36.4 C)  TempSrc: Oral Oral  Oral  SpO2: 96% 95%  99%  Weight:  97.7 kg (215 lb 6.4 oz)    Height:         Intake/Output Summary (Last 24 hours) at 12/20/15 0858 Last data filed at 12/20/15 0700  Gross per 24 hour  Intake              440 ml  Output             1550 ml  Net            -1110 ml   Filed Weights   12/18/15 0900 12/19/15 0818 12/20/15 0400  Weight: 97.1 kg (214 lb 1.6 oz) 98 kg (216 lb 1.6 oz) 97.7 kg (215 lb 6.4 oz)    Physical Exam   GEN: Well nourished, well developed, in no acute distress.  HEENT: Grossly normal.  Neck: Supple, no JVD, carotid bruits, or masses. Cardiac: RRR, no murmurs, rubs, or gallops. No clubbing, cyanosis.  Trace edema.  Radials/DP/PT 2+ and equal bilaterally.  Respiratory:  Respirations regular and unlabored, clear to auscultation bilaterally. GI: Soft, nontender, nondistended, BS + x 4. MS: no deformity or atrophy. Skin: warm and dry, no rash. Neuro:  Unable to move bilateral LEs. Psych: AAOx3.  Pleas put in for ant affect.  Labs    CBC  Recent Labs  12/18/15 0429 12/19/15 0356  WBC 9.2 7.6  NEUTROABS 8.4* 7.0  HGB 10.3* 10.4*  HCT 32.0* 32.5*  MCV 92.5 92.6  PLT 376 [id number]   Basic Metabolic Panel  Recent Labs  12/18/15 0429 12/19/15 0356  NA 134* 139  K 4.1 4.4  CL 101 107  CO2 25 26  GLUCOSE 148* 145*  BUN 27* 33*  CREATININE 0.84 0.77  CALCIUM 8.1* 8.2*  MG 2.2 2.3  PHOS 3.3  --    Liver Function Tests  Recent Labs  12/18/15 0429 12/19/15 0356  AST 61* 69*  ALT 33 37  ALKPHOS 116 114  BILITOT 0.2* 0.3  PROT 5.7* 5.4*  ALBUMIN 1.5* 1.5*   No results for input(s): LIPASE, AMYLASE in the last 72 hours. Cardiac Enzymes No results for input(s): CKTOTAL, CKMB, CKMBINDEX, TROPONINI in the last 72 hours. BNP Invalid input(s): POCBNP D-Dimer No results for input(s): DDIMER in the last 72 hours. Hemoglobin A1C No results for input(s): HGBA1C in the last 72 hours. Fasting Lipid Panel No results for input(s): CHOL, HDL, LDLCALC, TRIG, CHOLHDL, LDLDIRECT in the last 72 hours. Thyroid Function Tests No  results for input(s): TSH, T4TOTAL, T3FREE, THYROIDAB in the last 72 hours.  Invalid input(s): FREET3  Telemetry    Sinus rhythm switched back to atrial fibrillation/flutter at 12:45 am - Personally Reviewed  ECG    12/15/15: atrial flutter with variable AV conduction. 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 He also has new onset paraplegia. CT-guided biopsy is warranted to obtain tissue diagnosis. EXAM: CT GUIDANCE NEEDLE PLACEMENT MEDICATIONS: None. ANESTHESIA/SEDATION: Moderate (conscious) sedation was employed during this procedure. A total of Versed 2 mg and Fentanyl 100 mcg was administered intravenously. Moderate Sedation Time: 14 minutes. The patient's level of consciousness and vital signs were monitored continuously by radiology nursing throughout the procedure under my direct supervision. FLUOROSCOPY TIME:  Fluoroscopy Time: 0 minutes 0 seconds (0 mGy). COMPLICATIONS: None immediate. PROCEDURE: Informed written consent was obtained from the patient after a thorough discussion of the procedural risks, benefits and alternatives. All questions were addressed. Maximal Sterile Barrier Technique was utilized including caps, mask, sterile gowns, sterile gloves, sterile drape, hand hygiene and skin antiseptic. A timeout was performed prior to the initiation of the procedure. A planning axial CT scan was performed. The infiltrative mass along the medial aspect of the right lower lobe directly invading the posterior mediastinum was successfully identified. A suitable skin entry site was selected and marked. The region was sterilely prepped and draped in the standard fashion using chlorhexidine skin prep. Local anesthesia was attained by infiltration with 1% lidocaine. A small dermatotomy was made. Under  intermittent CT guidance, a 17 gauge introducer needle was advanced into the margin of the soft tissue mass. Multiple 18 gauge core biopsies were then coaxially obtained using the bio Pince automated biopsy device. Biopsy specimens were placed in saline and delivered to pathology for further analysis. Additional 20 gauge needle aspirate was performed and inject into a small amount of sterile saline for cultures. Post biopsy axial CT imaging demonstrates no evidence of pneumothorax, hemorrhage or other complicating feature. IMPRESSION: Technically successful CT-guided core biopsy of right lower lobe/ posterior mediastinal mass lesion. Signed, Criselda Peaches, MD [facility] Radiology Electronically Signed   By: Jacqulynn Cadet M.D.   On: 12/18/2015 12:58  Cardiac Studies   Echo 06/02/14: Study Conclusions  - Procedure narrative: Transthoracic echocardiography. Image quality was poor. The study was technically difficult, as a result of poor acoustic windows. - Left ventricle: The cavity size was normal. There was mild concentric hypertrophy. Systolic function was normal. The estimated ejection fraction was in the range of 60% to 65%. Images were inadequate for LV wall motion assessment. Doppler parameters are consistent with abnormal left ventricular relaxation (grade 1 diastolic dysfunction). - Mitral valve: Mildly calcified annulus. Normal thickness leaflets . - Left atrium: The atrium was mildly dilated. - Right atrium: The atrium was mildly dilated.  Patient Profile     Mr. Riss is  A 47M with hypertension, diabetes, TIA on clopidogrel, s/p CEA, and GERD here with several days of worsening malaise/fatigue and a recent fall who then developed Afib with RVR in the ED. He subsequently converted to NSR following uptitration of a diltiazem gtt and additional bolus dosing of metoprolol but is now back in atrial flutter.   Hospitalization also complicated by closed fracture of 7 thoracic vertebrae, R lower lobe lung mass, urinary retention and [name].    Assessment & Plan    # Atrial fibrillation with RVR: Mr. Coultas continues to maintain sinus rhythm.  Digoxin level is therapeutic.  Continue diltiazem 180mg  daily.  Start Eliquis after all procedures are completed.  Consider heparin if this will be a while, though this would need to be cleared by Interventional Radiology given his recent biopsy..   # [name]: TEE today at noon.  # Carotid stenosis s/p CEA:  LDL is 30.  Continue atorvastatin.  Continue Plavix.   # Prior TIA: Resumed Plavix.  Continue atorvastatin.   Signed, Skeet Latch, MD  12/20/2015, 8:58 AM

## 2015-12-20 NOTE — Progress Notes (Signed)
Progress Note    Marcus [name]  [id number] DOB: [birth year]  DOA: 12/14/2015 PCP: Jeanmarie Hubert, MD    Brief Narrative:   Chief complaint: F/U RLL lung mass/[name]/lower extremity weakness  Marcus [name] is an 75 y.o. male with PMH of hypertension, diabetes, TIA and GERD as well as recent UTI who was admitted 12/14/15 with chief complaint of new onset lower extremity weakness. In the ED, he developed atrial fibrillation with RVR and was placed on a Cardizem drip and given Lopressor. Urology was consulted for urinary retention and placed a Foley catheter. Broad-spectrum antibiotics were initiated. Imaging showed a closed fracture of T7 and a right lower lobe lung mass raising the question of whether he had a malignant process versus severe infection. CT-guided biopsy done 12/18/15. Radiation simulation done 12/17/15 but treatments have not been initiated pending pathology results. ID following for [name].  Assessment/Plan:   Principal Problems:   Lower extremity weakness/fall secondary to T7 compression fracture and direct extension of right lower lobe mass which could represent infection/CAP or malignancy, rule out discitis/osteomyelitis/[name]/decreased sensation Diagnostic uncertainty remains, but preliminary biopsy results so far do not show any evidence of malignancy. Blood cultures done on admission positive for MSSA, with surveillance blood cultures negative so far. The patient underwent CT-guided core biopsy of right lower lobe/posterior mediastinal mass lesion with pathology report pending at this time. The patient will need 6 weeks of cefazolin 2 g IV every 8 hours. Surveillance cultures negative for 2 days, will place PICC line. TEE done 12/20/15, negative for endocarditis. CIR consulted for consideration of inpatient rehabilitation. Continue Decadron for now.    Atrial fibrillation with RVR ([place]) Patient was placed on a Cardizem drip on admission  which was discontinued secondary to low blood pressure. Currently on digoxin and oral Cardizem. Remains in sinus rhythm.  Active Problems:   Essential hypertension Does not appear to be on antihypertensives at home, and IV Cardizem dropped his blood pressure. Blood pressure currently stable on oral Cardizem, 180 mg daily.    Carotid stenosis History of CEA.    Diabetes ([place]) Hemoglobin A1c 5.8% on 09/16/15. Mildly hyperglycemic here, likely secondary to Decadron. CBGs 132-148, continue insulin sensitive SSI.    HLD (hyperlipidemia) Continue Lipitor.    CVA (cerebral vascular accident) ([place]) Continue Plavix.    Urinary retention Foley catheter placed by urology.    Pressure ulcer to the sacrum Wound care consultation requested. Pressure reduction mattress ordered.   Family Communication/Anticipated D/C date and plan/Code Status   DVT prophylaxis: Lovenox ordered. Code Status: Full Code.  Family Communication: Wife/daughter at the bedside. Disposition Plan: CIR vs. SNF when stable and plan of care further defined.   Medical Consultants:    Cardiology  Radiation Oncology  Neurosurgery  Infectious Disease  Urology   Procedures:    Venous Dopplers 12/19/15: Negative.  2-D echo 12/17/15: EF 60-65 percent. Grade 2 diastolic dysfunction.  TEE 12/20/15: Negative for endocarditis.  Anti-Infectives:    Azithromycin 12/14/15 ---> 12/15/15  Rocephin 12/14/15 ---> 12/15/15  Cefazolin 12/15/15--->   Subjective:   The patient reports that his bowels moved yesterday and the hiccups are better. Review of symptoms is negative for nausea/vomiting, shortness of breath, chest pain.  Objective:    Vitals:   12/19/15 1633 12/19/15 2007 12/20/15 0044 12/20/15 0400  BP: (!) 148/80 (!) 160/81 (!) 153/77 (!) 158/94  Pulse:  63 65 66  Resp: 16 14 13 15   Temp:  98 F (  36.7 C) 98.2 F (36.8 C) 97.7 F (36.5 C)  TempSrc:  Oral Oral Oral  SpO2:  97% 96% 95%  Weight:     97.7 kg (215 lb 6.4 oz)  Height:        Intake/Output Summary (Last 24 hours) at 12/20/15 0807 Last data filed at 12/20/15 0700  Gross per 24 hour  Intake              795 ml  Output             1550 ml  Net             -755 ml   Filed Weights   12/18/15 0900 12/19/15 0818 12/20/15 0400  Weight: 97.1 kg (214 lb 1.6 oz) 98 kg (216 lb 1.6 oz) 97.7 kg (215 lb 6.4 oz)    Exam: General exam: Appears calm and comfortable.  Respiratory system: Occasional wheeze. Respiratory effort normal. Cardiovascular system: S1 & S2 heard, RRR. No JVD,  rubs, gallops or clicks. No murmurs. Gastrointestinal system: Abdomen is nondistended, soft and nontender. No organomegaly or masses felt. Normal bowel sounds heard. Central nervous system: Alert and oriented. Lower extremity paralysis. Extremities: No clubbing,  or cyanosis. No edema. Skin: Deep tissue injury to sacrum otherwise no rashes, lesions or ulcers. Psychiatry: Judgement and insight appear normal. Mood & affect appropriate.   Data Reviewed:   I have personally reviewed following labs and imaging studies:  Labs: Basic Metabolic Panel:  Recent Labs Lab 12/15/15 0351 12/16/15 0432 12/17/15 0337 12/18/15 0429 12/19/15 0356  NA 132* 134* 134* 134* 139  K 3.6 3.5 4.2 4.1 4.4  CL 96* 99* 102 101 107  CO2 28 27 24 25 26   GLUCOSE 110* 99 193* 148* 145*  BUN 16 22* 27* 27* 33*  CREATININE 0.86 0.89 0.80 0.84 0.77  CALCIUM 8.6* 8.5* 8.4* 8.1* 8.2*  MG  --  2.1 2.2 2.2 2.3  PHOS  --  3.2 3.0 3.3  --    GFR Estimated Creatinine Clearance: 93.9 mL/min (by C-G formula based on SCr of 0.77 mg/dL). Liver Function Tests:  Recent Labs Lab 12/14/15 0635 12/16/15 0432 12/17/15 0337 12/18/15 0429 12/19/15 0356  AST 44* 87* 116* 61* 69*  ALT 28 45 50 33 37  ALKPHOS 183* 146* 138* 116 114  BILITOT 0.7 0.5 0.3 0.2* 0.3  PROT 7.9 6.0* 5.9* 5.7* 5.4*  ALBUMIN 1.9* 1.5* 1.4* 1.5* 1.5*   Coagulation profile  Recent Labs Lab  12/14/15 1721  INR 1.04    CBC:  Recent Labs Lab 12/15/15 0351 12/16/15 0432 12/17/15 0337 12/18/15 0429 12/19/15 0356  WBC 13.1* 11.1* 6.5 9.2 7.6  NEUTROABS  --  9.4* 5.9 8.4* 7.0  HGB 10.5* 10.6* 11.4* 10.3* 10.4*  HCT 32.7* 33.0* 35.4* 32.0* 32.5*  MCV 93.7 93.5 93.4 92.5 92.6  PLT 275 286 321 376 359   CBG:  Recent Labs Lab 12/14/15 0638 12/19/15 1724 12/19/15 2318 12/20/15 0533  GLUCAP 138* 148* 143* 132*   Sepsis Labs:  Recent Labs Lab 12/14/15 0906 12/14/15 1151  12/16/15 0432 12/17/15 0337 12/18/15 0429 12/19/15 0356  WBC  --   --   < > 11.1* 6.5 9.2 7.6  LATICACIDVEN 1.07 1.54  --   --  1.2  --   --   < > = values in this interval not displayed.  Microbiology Recent Results (from the past 240 hour(s))  Blood culture (routine x 2)     Status: Abnormal  Collection Time: 12/14/15  8:50 AM  Result Value Ref Range Status   Specimen Description BLOOD LEFT ARM  Final   Special Requests BOTTLES DRAWN AEROBIC AND ANAEROBIC 10CC  Final   Culture  Setup Time   Final    IN BOTH AEROBIC AND ANAEROBIC BOTTLES GRAM POSITIVE COCCI IN CLUSTERS CRITICAL RESULT CALLED TO, READ BACK BY AND VERIFIED WITH: TO TO SGULLETT(PHARD) BY MCAMPBELL 12/15/2015 AT 5:55AM    Culture (A)  Final    STAPHYLOCOCCUS AUREUS SUSCEPTIBILITIES PERFORMED ON PREVIOUS CULTURE WITHIN THE LAST 5 DAYS.    Report Status 12/17/2015 FINAL  Final  Blood culture (routine x 2)     Status: Abnormal   Collection Time: 12/14/15  9:00 AM  Result Value Ref Range Status   Specimen Description BLOOD RIGHT HAND  Final   Special Requests BOTTLES DRAWN AEROBIC AND ANAEROBIC 5CC  Final   Culture  Setup Time   Final    IN BOTH AEROBIC AND ANAEROBIC BOTTLES GRAM POSITIVE COCCI IN CLUSTERS Organism ID to follow CRITICAL RESULT CALLED TO, READ BACK BY AND VERIFIED WITH: TO TO SGULLETT(PHARD) BY MCAMPBELL 12/15/2015 AT 5:55AM    Culture STAPHYLOCOCCUS AUREUS (A)  Final   Report Status 12/17/2015 FINAL   Final   Organism ID, Bacteria STAPHYLOCOCCUS AUREUS  Final      Susceptibility   Staphylococcus aureus - MIC*    CIPROFLOXACIN <=0.5 SENSITIVE Sensitive     ERYTHROMYCIN <=0.25 SENSITIVE Sensitive     GENTAMICIN <=0.5 SENSITIVE Sensitive     OXACILLIN <=0.25 SENSITIVE Sensitive     TETRACYCLINE <=1 SENSITIVE Sensitive     VANCOMYCIN <=0.5 SENSITIVE Sensitive     TRIMETH/SULFA <=10 SENSITIVE Sensitive     CLINDAMYCIN <=0.25 SENSITIVE Sensitive     RIFAMPIN <=0.5 SENSITIVE Sensitive     Inducible Clindamycin NEGATIVE Sensitive     * STAPHYLOCOCCUS AUREUS  Blood Culture ID Panel (Reflexed)     Status: Abnormal   Collection Time: 12/14/15  9:00 AM  Result Value Ref Range Status   Enterococcus species NOT DETECTED NOT DETECTED Final   Vancomycin resistance NOT DETECTED NOT DETECTED Final   Listeria monocytogenes NOT DETECTED NOT DETECTED Final   Staphylococcus species DETECTED (A) NOT DETECTED Final    Comment: CRITICAL RESULT CALLED TO, READ BACK BY AND VERIFIED WITH: TO SGULLETT(PHARD) BY MCAMPBELL 12/15/2015 AT 5:55AM    Staphylococcus aureus DETECTED (A) NOT DETECTED Final    Comment: CRITICAL RESULT CALLED TO, READ BACK BY AND VERIFIED WITH: TO SGULLETT(PHARD) BY MCAMPBELL 12/15/2015 AT 5:55AM    Methicillin resistance NOT DETECTED NOT DETECTED Final   Streptococcus species NOT DETECTED NOT DETECTED Final   Streptococcus agalactiae NOT DETECTED NOT DETECTED Final   Streptococcus pneumoniae NOT DETECTED NOT DETECTED Final   Streptococcus pyogenes NOT DETECTED NOT DETECTED Final   Acinetobacter baumannii NOT DETECTED NOT DETECTED Final   Enterobacteriaceae species NOT DETECTED NOT DETECTED Final   Enterobacter cloacae complex NOT DETECTED NOT DETECTED Final   Escherichia coli NOT DETECTED NOT DETECTED Final   Klebsiella oxytoca NOT DETECTED NOT DETECTED Final   Klebsiella pneumoniae NOT DETECTED NOT DETECTED Final   Proteus species NOT DETECTED NOT DETECTED Final   Serratia  marcescens NOT DETECTED NOT DETECTED Final   Carbapenem resistance NOT DETECTED NOT DETECTED Final   Haemophilus influenzae NOT DETECTED NOT DETECTED Final   Neisseria meningitidis NOT DETECTED NOT DETECTED Final   Pseudomonas aeruginosa NOT DETECTED NOT DETECTED Final   Candida albicans NOT DETECTED NOT DETECTED  Final   Candida glabrata NOT DETECTED NOT DETECTED Final   Candida krusei NOT DETECTED NOT DETECTED Final   Candida parapsilosis NOT DETECTED NOT DETECTED Final   Candida tropicalis NOT DETECTED NOT DETECTED Final  Urine culture     Status: Abnormal   Collection Time: 12/14/15  4:22 PM  Result Value Ref Range Status   Specimen Description URINE, RANDOM  Final   Special Requests NONE  Final   Culture 20,000 COLONIES/mL STAPHYLOCOCCUS AUREUS (A)  Final   Report Status 12/17/2015 FINAL  Final   Organism ID, Bacteria STAPHYLOCOCCUS AUREUS (A)  Final      Susceptibility   Staphylococcus aureus - MIC*    CIPROFLOXACIN <=0.5 SENSITIVE Sensitive     GENTAMICIN <=0.5 SENSITIVE Sensitive     NITROFURANTOIN <=16 SENSITIVE Sensitive     OXACILLIN <=0.25 SENSITIVE Sensitive     TETRACYCLINE <=1 SENSITIVE Sensitive     VANCOMYCIN 1 SENSITIVE Sensitive     TRIMETH/SULFA <=10 SENSITIVE Sensitive     CLINDAMYCIN <=0.25 SENSITIVE Sensitive     RIFAMPIN <=0.5 SENSITIVE Sensitive     Inducible Clindamycin NEGATIVE Sensitive     * 20,000 COLONIES/mL STAPHYLOCOCCUS AUREUS  MRSA PCR Screening     Status: None   Collection Time: 12/14/15 11:57 PM  Result Value Ref Range Status   MRSA by PCR NEGATIVE NEGATIVE Final    Comment:        The GeneXpert MRSA Assay (FDA approved for NASAL specimens only), is one component of a comprehensive MRSA colonization surveillance program. It is not intended to diagnose MRSA infection nor to guide or monitor treatment for MRSA infections.   Culture, blood (Routine X 2) w Reflex to ID Panel     Status: None (Preliminary result)   Collection Time:  12/17/15  7:29 PM  Result Value Ref Range Status   Specimen Description BLOOD LEFT HAND  Final   Special Requests BOTTLES DRAWN AEROBIC ONLY 5CC  Final   Culture NO GROWTH 2 DAYS  Final   Report Status PENDING  Incomplete  Culture, blood (Routine X 2) w Reflex to ID Panel     Status: None (Preliminary result)   Collection Time: 12/17/15  7:45 PM  Result Value Ref Range Status   Specimen Description BLOOD LEFT ANTECUBITAL  Final   Special Requests BOTTLES DRAWN AEROBIC ONLY 5CC  Final   Culture NO GROWTH 2 DAYS  Final   Report Status PENDING  Incomplete  Culture, expectorated sputum-assessment     Status: None   Collection Time: 12/18/15  9:44 AM  Result Value Ref Range Status   Specimen Description EXPECTORATED SPUTUM  Final   Special Requests NONE  Final   Sputum evaluation   Final    THIS SPECIMEN IS ACCEPTABLE. RESPIRATORY CULTURE REPORT TO FOLLOW.   Report Status 12/18/2015 FINAL  Final  Culture, respiratory (NON-Expectorated)     Status: None (Preliminary result)   Collection Time: 12/18/15  9:44 AM  Result Value Ref Range Status   Specimen Description EXPECTORATED SPUTUM  Final   Special Requests NONE  Final   Gram Stain   Final    ABUNDANT WBC PRESENT, PREDOMINANTLY PMN FEW BUDDING YEAST SEEN RARE GRAM POSITIVE COCCI IN PAIRS RARE GRAM NEGATIVE COCCOBACILLI    Culture CULTURE REINCUBATED FOR BETTER GROWTH  Final   Report Status PENDING  Incomplete  Acid Fast Smear (AFB)     Status: None   Collection Time: 12/18/15 11:48 AM  Result Value Ref Range Status  AFB Specimen Processing Concentration  Final   Acid Fast Smear Negative  Final    Comment: (NOTE) Performed At: [facility] [place], [place] [id number] Lindon Romp MD [id number]    Source (AFB) ASPIRATE  Final    Comment:  RLL POSTERIOR MEDIASTINAL MASS AT T7   Aerobic/Anaerobic Culture (surgical/deep wound)     Status: None (Preliminary result)   Collection Time: 12/18/15 11:48  AM  Result Value Ref Range Status   Specimen Description ASPIRATE  Final   Special Requests RLL POSTERIOR MEDIASTINAL MASS AT T7  Final   Gram Stain   Final    RARE WBC PRESENT, PREDOMINANTLY PMN NO ORGANISMS SEEN    Culture CULTURE REINCUBATED FOR BETTER GROWTH  Final   Report Status PENDING  Incomplete    Radiology: Ct Guided Needle Placement  Result Date: 12/18/2015 INDICATION: 75 year old male with a history of right lower lobe/posterior mediastinal mass with destruction of the T7 vertebral body. He also has new onset paraplegia. CT-guided biopsy is warranted to obtain tissue diagnosis. EXAM: CT GUIDANCE NEEDLE PLACEMENT MEDICATIONS: None. ANESTHESIA/SEDATION: Moderate (conscious) sedation was employed during this procedure. A total of Versed 2 mg and Fentanyl 100 mcg was administered intravenously. Moderate Sedation Time: 14 minutes. The patient's level of consciousness and vital signs were monitored continuously by radiology nursing throughout the procedure under my direct supervision. FLUOROSCOPY TIME:  Fluoroscopy Time: 0 minutes 0 seconds (0 mGy). COMPLICATIONS: None immediate. PROCEDURE: Informed written consent was obtained from the patient after a thorough discussion of the procedural risks, benefits and alternatives. All questions were addressed. Maximal Sterile Barrier Technique was utilized including caps, mask, sterile gowns, sterile gloves, sterile drape, hand hygiene and skin antiseptic. A timeout was performed prior to the initiation of the procedure. A planning axial CT scan was performed. The infiltrative mass along the medial aspect of the right lower lobe directly invading the posterior mediastinum was successfully identified. A suitable skin entry site was selected and marked. The region was sterilely prepped and draped in the standard fashion using chlorhexidine skin prep. Local anesthesia was attained by infiltration with 1% lidocaine. A small dermatotomy was made. Under  intermittent CT guidance, a 17 gauge introducer needle was advanced into the margin of the soft tissue mass. Multiple 18 gauge core biopsies were then coaxially obtained using the bio Pince automated biopsy device. Biopsy specimens were placed in saline and delivered to pathology for further analysis. Additional 20 gauge needle aspirate was performed and inject into a small amount of sterile saline for cultures. Post biopsy axial CT imaging demonstrates no evidence of pneumothorax, hemorrhage or other complicating feature. IMPRESSION: Technically successful CT-guided core biopsy of right lower lobe/ posterior mediastinal mass lesion. Signed, Criselda Peaches, MD [facility] Electronically Signed   By: Jacqulynn Cadet M.D.   On: 12/18/2015 12:58    Medications:   . atorvastatin  40 mg Oral q1800  .  ceFAZolin (ANCEF) IV  2 g Intravenous Q8H  . cholecalciferol  5,000 Units Oral QHS  . clopidogrel  75 mg Oral Daily  . dexamethasone  10 mg Intravenous Q6H  . digoxin  0.25 mg Oral Daily  . diltiazem  180 mg Oral Daily  . DULoxetine  30 mg Oral Q supper  . heparin subcutaneous  5,000 Units Subcutaneous Q8H  . insulin aspart  0-9 Units Subcutaneous Q6H  . LORazepam  0.5 mg Oral Daily  . polyethylene glycol  17 g Oral  Daily  . senna-docusate  1 tablet Oral BID  . timolol  1 drop Both Eyes BID   Continuous Infusions: . sodium chloride    . sodium chloride      Medical decision making is of high complexity and this patient is at high risk of deterioration, therefore this is a level 3 visit.    LOS: 6 days   [facility] Pager [phone]. If unable to reach me by pager, please call my cell phone at [phone].  *Please refer to [email or web address], password [email or web address] to get updated schedule on who will round on this patient, as hospitalists switch teams weekly. If 7PM-7AM, please contact night-coverage at [email or web address],  password [email or web address] for any overnight needs.  12/20/2015, 8:07 AM

## 2015-12-20 NOTE — NC FL2 (Signed)
[place] MEDICAID FL2 LEVEL OF CARE SCREENING TOOL     IDENTIFICATION  Patient Name: Marcus [name] Birthdate: Feb 17, 1941 Sex: male Admission Date (Current Location): 12/14/2015  [facility] and [place] Number:  Herbalist and Address:  [facility], [place] [address], [place], [place] [address]      Provider Number: [id number]  Attending Physician Name and Address:  Venetia Maxon Rama, MD  Relative Name and Phone Number:       Current Level of Care: Hospital Recommended Level of Care: [place] Prior Approval Number:    Date Approved/Denied:   PASRR Number: [id number]  Discharge Plan: SNF    Current Diagnoses: Patient Active Problem List   Diagnosis Date Noted  . Pressure injury of skin 12/19/2015  . Closed fracture of seventh thoracic vertebra ([place])   . T7 vertebral fracture ([place])   . Decreased sensation   . Bacteremia   . Atrial fibrillation with RVR ([place]) 12/14/2015  . Fall 12/14/2015  . CAP (community acquired pneumonia) 12/14/2015  . Compression fracture of vertebra ([place]) 12/14/2015  . Urinary retention 12/14/2015  . New onset a-fib ([place]) 12/14/2015  . Lung mass 12/14/2015  . Lower extremity weakness 12/14/2015  . 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 SOB (shortness of breath) 11/12/2015  . Chest pain 11/12/2015  . S/P carotid endarterectomy 04/10/2015  . 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 Glaucoma 03/20/2015  . Diabetes ([place]) 10/10/2014  . HLD (hyperlipidemia) 10/10/2014  . CVA (cerebral vascular accident) ([place]) 10/10/2014  . 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 Essential hypertension   . Carotid stenosis   . TIA (transient ischemic attack) 06/01/2014  . Malaise and fatigue 11/08/2013  . Elevated alkaline phosphatase level 07/20/2012  . Lumbago   . Benign essential tremor   . Vitamin D deficiency      Orientation RESPIRATION BLADDER Height & Weight     Self  Normal Continent, Indwelling catheter Weight: 98 kg (216 lb 1.6 oz) Height:  5' 7.5" (171.5 cm)  BEHAVIORAL SYMPTOMS/MOOD NEUROLOGICAL BOWEL NUTRITION STATUS   (NONE)  (NONE) Incontinent  (Currently NPO)  AMBULATORY STATUS COMMUNICATION OF NEEDS Skin   Extensive Assist Verbally PU Stage and Appropriate Care (Deep tissue injury right buttocks)                       Personal Care Assistance Level of Assistance  Bathing, Dressing, Feeding Bathing Assistance: Limited assistance Feeding assistance: Independent Dressing Assistance: Limited assistance     Functional Limitations Info  Sight, Hearing, Speech Sight Info: Adequate Hearing Info: Adequate Speech Info: Adequate    SPECIAL CARE FACTORS FREQUENCY  PT (By licensed PT), OT (By licensed OT)     PT Frequency: 5/week OT Frequency: 5/week            Contractures Contractures Info: Not present    Additional Factors Info  Code Status, Allergies, Insulin Sliding Scale, Psychotropic Code Status Info: Full Code Allergies Info: Codeine, Demerol Meperidine, Contrast Media Iodinated Diagnostic Agents, Gabapentin, Other Psychotropic Info: Ativan, Cymbalta Insulin Sliding Scale Info: 4/day       Current Medications (12/20/2015):  This is the current hospital active medication list Current Facility-Administered Medications  Medication Dose Route Frequency Provider Last Rate Last Dose  . 0.9 %  sodium chloride infusion   Intravenous Continuous Skeet Latch, MD      .  0.9 %  sodium chloride infusion   Intravenous Continuous Arbutus Leas, NP      . acetaminophen (TYLENOL) tablet 1,000 mg  1,000 mg Oral Q6H PRN Bertram Savin Sheikh, DO      . atorvastatin (LIPITOR) tablet 40 mg  40 mg Oral q1800 Venetia Maxon Rama, MD   40 mg at 12/19/15 1804  . ceFAZolin (ANCEF) IVPB 2g/100 mL premix  2 g Intravenous Q8H Carlyle Basques, MD   2 g at 12/19/15 2226  . cholecalciferol  (VITAMIN D) tablet 5,000 Units  5,000 Units Oral QHS Venetia Maxon Rama, MD   5,000 Units at 12/19/15 2215  . clopidogrel (PLAVIX) tablet 75 mg  75 mg Oral Daily Venetia Maxon Rama, MD   75 mg at 12/19/15 1804  . dexamethasone (DECADRON) injection 10 mg  10 mg Intravenous Q6H Omair Latif Sheikh, DO   10 mg at 12/20/15 0111  . digoxin (LANOXIN) tablet 0.25 mg  0.25 mg Oral Daily Skeet Latch, MD   0.25 mg at 12/19/15 0806  . diltiazem (CARDIZEM CD) 24 hr capsule 180 mg  180 mg Oral Daily Skeet Latch, MD   180 mg at 12/19/15 0806  . DULoxetine (CYMBALTA) DR capsule 30 mg  30 mg Oral Q supper Venetia Maxon Rama, MD   30 mg at 12/19/15 1804  . guaiFENesin-dextromethorphan (ROBITUSSIN DM) 100-10 MG/5ML syrup 5 mL  5 mL Oral Q4H PRN Bertram Savin Sheikh, DO   5 mL at 12/18/15 0504  . heparin injection 5,000 Units  5,000 Units Subcutaneous Q8H Saverio Danker, PA-C   5,000 Units at 12/19/15 2215  . HYDROcodone-acetaminophen (NORCO) 10-325 MG per tablet 1 tablet  1 tablet Oral Q4H PRN Kerney Elbe, DO   1 tablet at 12/19/15 1804  . insulin aspart (novoLOG) injection 0-9 Units  0-9 Units Subcutaneous Q6H Venetia Maxon Rama, MD   1 Units at 12/20/15 0111  . ipratropium-albuterol (DUONEB) 0.5-2.5 (3) MG/3ML nebulizer solution 3 mL  3 mL Nebulization Q2H PRN [employer], DO      . LORazepam (ATIVAN) tablet 0.5 mg  0.5 mg Oral Daily Jake Church Masters, RPH   0.5 mg at 12/19/15 [id number]  . LORazepam (ATIVAN) tablet 0.5 mg  0.5 mg Oral BID PRN Jake Church Masters, RPH   0.5 mg at 12/19/15 1630  . morphine 2 MG/ML injection 2 mg  2 mg Intravenous Q4H PRN Bertram Savin Sheikh, DO   2 mg at 12/18/15 0901  . ondansetron (ZOFRAN) injection 4 mg  4 mg Intravenous Q6H PRN Lezlie Octave Black, NP      . polyethylene glycol (MIRALAX / GLYCOLAX) packet 17 g  17 g Oral Daily Lavina Hamman, MD   17 g at 12/19/15 0806  . senna-docusate (Senokot-S) tablet 1 tablet  1 tablet Oral BID Lavina Hamman, MD   1 tablet at 12/19/15 2217  .  timolol (TIMOPTIC) 0.5 % ophthalmic solution 1 drop  1 drop Both Eyes BID Kerney Elbe, DO   1 drop at 12/19/15 2215     Discharge Medications: Please see discharge summary for a list of discharge medications.  Relevant Imaging Results:  Relevant Lab Results:   Additional Information SSN: [id number]  Rigoberto Noel, LCSW

## 2015-12-20 NOTE — Interval H&P Note (Signed)
History and Physical Interval Note:  12/20/2015 11:11 AM  Marcus [name]  has presented today for surgery, with the diagnosis of bactermiaTT  The various methods of treatment have been discussed with the patient and family. After consideration of risks, benefits and other options for treatment, the patient has consented to  Procedure(s): TRANSESOPHAGEAL ECHOCARDIOGRAM (TEE) (N/A) as a surgical intervention .  The patient's history has been reviewed, patient examined, no change in status, stable for surgery.  I have reviewed the patient's chart and labs.  Questions were answered to the patient's satisfaction.     Ena Dawley

## 2015-12-20 NOTE — Progress Notes (Signed)
I called the patient's wife Jackelyn Poling to let her know we are still waiting on pathology. We will continue to follow along closely to determine the course of treatment.

## 2015-12-20 NOTE — Progress Notes (Signed)
Patient Name: Marcus [name] Date of Encounter: 12/20/2015  Primary Cardiologist: Kate Sable (new)  Hospital Problem List     Principal Problem:   Atrial fibrillation with RVR [facility]) Active Problems:   Essential hypertension   Carotid stenosis   Diabetes ([place])   HLD (hyperlipidemia)   CVA (cerebral vascular accident) ([place])   Fall   CAP (community acquired pneumonia)   Compression fracture of vertebra ([place])   Urinary retention   New onset a-fib ([facility])   Lung mass   Lower extremity weakness   Bacteremia   T7 vertebral fracture ([facility])   Decreased sensation   Closed fracture of seventh thoracic vertebra (HCC)   Pressure injury of skin    Subjective   Feeling well this AM.  Denies chest pain or shortness of breath. Able to move his toes some.  Inpatient Medications    Scheduled Meds: . atorvastatin  40 mg Oral q1800  .  ceFAZolin (ANCEF) IV  2 g Intravenous Q8H  . cholecalciferol  5,000 Units Oral QHS  . clopidogrel  75 mg Oral Daily  . dexamethasone  10 mg Intravenous Q6H  . digoxin  0.25 mg Oral Daily  . diltiazem  180 mg Oral Daily  . DULoxetine  30 mg Oral Q supper  . enoxaparin (LOVENOX) injection  40 mg Subcutaneous Q24H  . insulin aspart  0-9 Units Subcutaneous Q6H  . LORazepam  0.5 mg Oral Daily  . polyethylene glycol  17 g Oral Daily  . senna-docusate  1 tablet Oral BID  . timolol  1 drop Both Eyes BID   Continuous Infusions: . sodium chloride    . sodium chloride     PRN Meds: acetaminophen, guaiFENesin-dextromethorphan, HYDROcodone-acetaminophen, ipratropium-albuterol, LORazepam, morphine injection, ondansetron (ZOFRAN) IV   Vital Signs    Vitals:   12/20/15 0044 12/20/15 0400 12/20/15 0808 12/20/15 0820  BP: (!) 153/77 (!) 158/94 (!) 171/79   Pulse: 65 66    Resp: 13 15 18    Temp: 98.2 F (36.8 C) 97.7 F (36.5 C)  97.6 F (36.4 C)  TempSrc: Oral Oral  Oral  SpO2: 96% 95%  99%  Weight:  97.7 kg (215 lb 6.4 oz)    Height:         Intake/Output Summary (Last 24 hours) at 12/20/15 0858 Last data filed at 12/20/15 0700  Gross per 24 hour  Intake              440 ml  Output             1550 ml  Net            -1110 ml   Filed Weights   12/18/15 0900 12/19/15 0818 12/20/15 0400  Weight: 97.1 kg (214 lb 1.6 oz) 98 kg (216 lb 1.6 oz) 97.7 kg (215 lb 6.4 oz)    Physical Exam   GEN: Well nourished, well developed, in no acute distress.  HEENT: Grossly normal.  Neck: Supple, no JVD, carotid bruits, or masses. Cardiac: RRR, no murmurs, rubs, or gallops. No clubbing, cyanosis.  Trace edema.  Radials/DP/PT 2+ and equal bilaterally.  Respiratory:  Respirations regular and unlabored, clear to auscultation bilaterally. GI: Soft, nontender, nondistended, BS + x 4. MS: no deformity or atrophy. Skin: warm and dry, no rash. Neuro:  Unable to move bilateral LEs. Psych: AAOx3.  Pleas put in for ant affect.  Labs    CBC  Recent Labs  12/18/15 0429 12/19/15 0356  WBC 9.2 7.6  NEUTROABS 8.4* 7.0  HGB 10.3* 10.4*  HCT 32.0* 32.5*  MCV 92.5 92.6  PLT 376 [id number]   Basic Metabolic Panel  Recent Labs  12/18/15 0429 12/19/15 0356  NA 134* 139  K 4.1 4.4  CL 101 107  CO2 25 26  GLUCOSE 148* 145*  BUN 27* 33*  CREATININE 0.84 0.77  CALCIUM 8.1* 8.2*  MG 2.2 2.3  PHOS 3.3  --    Liver Function Tests  Recent Labs  12/18/15 0429 12/19/15 0356  AST 61* 69*  ALT 33 37  ALKPHOS 116 114  BILITOT 0.2* 0.3  PROT 5.7* 5.4*  ALBUMIN 1.5* 1.5*   No results for input(s): LIPASE, AMYLASE in the last 72 hours. Cardiac Enzymes No results for input(s): CKTOTAL, CKMB, CKMBINDEX, TROPONINI in the last 72 hours. BNP Invalid input(s): POCBNP D-Dimer No results for input(s): DDIMER in the last 72 hours. Hemoglobin A1C No results for input(s): HGBA1C in the last 72 hours. Fasting Lipid Panel No results for input(s): CHOL, HDL, LDLCALC, TRIG, CHOLHDL, LDLDIRECT in the last 72 hours. Thyroid Function Tests No  results for input(s): TSH, T4TOTAL, T3FREE, THYROIDAB in the last 72 hours.  Invalid input(s): FREET3  Telemetry    Sinus rhythm switched back to atrial fibrillation/flutter at 12:45 am - Personally Reviewed  ECG    12/15/15: atrial flutter with variable AV conduction. 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 He also has new onset paraplegia. CT-guided biopsy is warranted to obtain tissue diagnosis. EXAM: CT GUIDANCE NEEDLE PLACEMENT MEDICATIONS: None. ANESTHESIA/SEDATION: Moderate (conscious) sedation was employed during this procedure. A total of Versed 2 mg and Fentanyl 100 mcg was administered intravenously. Moderate Sedation Time: 14 minutes. The patient's level of consciousness and vital signs were monitored continuously by radiology nursing throughout the procedure under my direct supervision. FLUOROSCOPY TIME:  Fluoroscopy Time: 0 minutes 0 seconds (0 mGy). COMPLICATIONS: None immediate. PROCEDURE: Informed written consent was obtained from the patient after a thorough discussion of the procedural risks, benefits and alternatives. All questions were addressed. Maximal Sterile Barrier Technique was utilized including caps, mask, sterile gowns, sterile gloves, sterile drape, hand hygiene and skin antiseptic. A timeout was performed prior to the initiation of the procedure. A planning axial CT scan was performed. The infiltrative mass along the medial aspect of the right lower lobe directly invading the posterior mediastinum was successfully identified. A suitable skin entry site was selected and marked. The region was sterilely prepped and draped in the standard fashion using chlorhexidine skin prep. Local anesthesia was attained by infiltration with 1% lidocaine. A small dermatotomy was made. Under  intermittent CT guidance, a 17 gauge introducer needle was advanced into the margin of the soft tissue mass. Multiple 18 gauge core biopsies were then coaxially obtained using the bio Pince automated biopsy device. Biopsy specimens were placed in saline and delivered to pathology for further analysis. Additional 20 gauge needle aspirate was performed and inject into a small amount of sterile saline for cultures. Post biopsy axial CT imaging demonstrates no evidence of pneumothorax, hemorrhage or other complicating feature. IMPRESSION: Technically successful CT-guided core biopsy of right lower lobe/ posterior mediastinal mass lesion. Signed, Criselda Peaches, MD [facility] Radiology Electronically Signed   By: Jacqulynn Cadet M.D.   On: 12/18/2015 12:58  Cardiac Studies   Echo 06/02/14: Study Conclusions  - Procedure narrative: Transthoracic echocardiography. Image quality was poor. The study was technically difficult, as a result of poor acoustic windows. - Left ventricle: The cavity size was normal. There was mild concentric hypertrophy. Systolic function was normal. The estimated ejection fraction was in the range of 60% to 65%. Images were inadequate for LV wall motion assessment. Doppler parameters are consistent with abnormal left ventricular relaxation (grade 1 diastolic dysfunction). - Mitral valve: Mildly calcified annulus. Normal thickness leaflets . - Left atrium: The atrium was mildly dilated. - Right atrium: The atrium was mildly dilated.  Patient Profile     Mr. Turkovich is  A 54M with hypertension, diabetes, TIA on clopidogrel, s/p CEA, and GERD here with several days of worsening malaise/fatigue and a recent fall who then developed Afib with RVR in the ED. He subsequently converted to NSR following uptitration of a diltiazem gtt and additional bolus dosing of metoprolol but is now back in atrial flutter.   Hospitalization also complicated by closed fracture of 7 thoracic vertebrae, R lower lobe lung mass, urinary retention and [name].    Assessment & Plan    # Atrial fibrillation with RVR: Mr. Krause continues to maintain sinus rhythm.  Digoxin level is therapeutic.  Continue diltiazem 180mg  daily.  Start Eliquis after all procedures are completed.  Consider heparin if this will be a while, though this would need to be cleared by Interventional Radiology given his recent biopsy..   # [name]: TEE today at noon.  # Carotid stenosis s/p CEA:  LDL is 30.  Continue atorvastatin.  Continue Plavix.   # Prior TIA: Resumed Plavix.  Continue atorvastatin.   Signed, Skeet Latch, MD  12/20/2015, 8:58 AM

## 2015-12-20 NOTE — Care Management Important Message (Signed)
Important Message  Patient Details  Name: Marcus [name] MRN: [id number] Date of Birth: 06/04/1940   Medicare Important Message Given:  Yes    Nathen May 12/20/2015, 12:27 PM

## 2015-12-20 NOTE — Clinical Social Work Note (Signed)
Clinical Social Work Assessment  Patient Details  Name: Marcus [name] MRN: [id number] Date of Birth: 05/24/1940  Date of referral:  12/20/15               Reason for consult:  Facility Placement, Discharge Planning                Permission sought to share information with:  Facility Sport and exercise psychologist, Family Supports Permission granted to share information::     Name::     Neoma Laming and Advertising copywriter::  SNFs  Relationship::  Mom and daughter  Contact Information:     Housing/Transportation Living arrangements for the past 2 months:  Single Family Home Source of Information:  Spouse, Patient, Adult Children Patient Interpreter Needed:  None Criminal Activity/Legal Involvement Pertinent to Current Situation/Hospitalization:  No - Comment as needed Significant Relationships:  Adult Children, Spouse Lives with:  Spouse Do you feel safe going back to the place where you live?  Yes Need for family participation in patient care:  Yes (Comment)  Care giving concerns:  Patient and family agree with recommendation for CIR, they insist that the patient get into CIR.   Social Worker assessment / plan:  CSW met with patient and family at bedside to complete assessment. CSW explained that CIR has been recommended for the patient but that SNF will also need to be considered in case CIR is not a viable option for the patient. Family was somewhat resistant to the idea of SNF as they prefer the patient receive rehab at the hospital. CSW explained SNF search/placement process and reason for needing this alternative plan in place. Family does not want to pursue SNF just yet until they have a better idea of what rehab has to say. CSW will wait for results of IP rehab consult. SNF list provided to the patient and wife.   Employment status:  Retired Nurse, adult PT Recommendations:  Inpatient [place], [place] / Referral to community  resources:  [place]  Patient/Family's Response to care:  The patient and family appear happy with the care the patient has received. They appreciate information provided by CSW.  Patient/Family's Understanding of and Emotional Response to Diagnosis, Current Treatment, and Prognosis:  The patient and family appear to have a good understanding of the patient's reason for admission. They understand that the patient will need rehab at time of discharge.   Emotional Assessment Appearance:  Appears stated age Attitude/Demeanor/Rapport:  Other (Patient has some confusion but is appropriate and welcoming of CSW.) Affect (typically observed):  Accepting, Appropriate, Calm, Pleasant Orientation:  Oriented to Self, Oriented to Place, Oriented to  Time Alcohol / Substance use:  Not Applicable Psych involvement (Current and /or in the community):  No (Comment)  Discharge Needs  Concerns to be addressed:  Discharge Planning Concerns Readmission within the last 30 days:  No Current discharge risk:  Chronically ill, Physical Impairment Barriers to Discharge:  Continued Medical Work up   Fredderick Phenix B, LCSW 12/20/2015, 12:56 AM

## 2015-12-20 NOTE — Progress Notes (Signed)
Rehab admissions - I met with patient, his wife and his daughter at the bedside.  They would like inpatient rehab admission.  I will open the fax and request inpatient rehab from [facility] carrier.  I will follow up on Monday.  Call me for questions.  #[phone]

## 2015-12-21 DIAGNOSIS — S22069G Unspecified fracture of T7-T8 vertebra, subsequent encounter for fracture with delayed healing: Secondary | ICD-10-CM

## 2015-12-21 DIAGNOSIS — I498 Other specified cardiac arrhythmias: Secondary | ICD-10-CM

## 2015-12-21 LAB — GLUCOSE, CAPILLARY
GLUCOSE-CAPILLARY: 128 mg/dL — AB (ref 65–99)
GLUCOSE-CAPILLARY: 118 mg/dL — AB (ref 65–99)
GLUCOSE-CAPILLARY: 160 mg/dL — AB (ref 65–99)
Glucose-Capillary: 130 mg/dL — ABNORMAL HIGH (ref 65–99)
Glucose-Capillary: 142 mg/dL — ABNORMAL HIGH (ref 65–99)

## 2015-12-21 LAB — CULTURE, RESPIRATORY W GRAM STAIN

## 2015-12-21 LAB — BASIC METABOLIC PANEL
Anion gap: 6 (ref 5–15)
BUN: 31 mg/dL — AB (ref 6–20)
CHLORIDE: 102 mmol/L (ref 101–111)
CO2: 27 mmol/L (ref 22–32)
CREATININE: 0.7 mg/dL (ref 0.61–1.24)
Calcium: 7.9 mg/dL — ABNORMAL LOW (ref 8.9–10.3)
GFR calc Af Amer: >60 mL/min (ref >60)
GFR calc non Af Amer: >60 mL/min (ref >60)
Glucose, Bld: 138 mg/dL — ABNORMAL HIGH (ref 65–99)
Potassium: 4.7 mmol/L (ref 3.5–5.1)
Sodium: 135 mmol/L (ref 135–145)

## 2015-12-21 LAB — CBC
HEMATOCRIT: 35.3 % — AB (ref 39.0–52.0)
HEMOGLOBIN: 11.5 g/dL — AB (ref 13.0–17.0)
MCH: 30.1 pg (ref 26.0–34.0)
MCHC: 32.6 g/dL (ref 30.0–36.0)
MCV: 92.4 fL (ref 78.0–100.0)
Platelets: 400 10*3/uL (ref 150–400)
RBC: 3.82 MIL/uL — ABNORMAL LOW (ref 4.22–5.81)
RDW: 14 % (ref 11.5–15.5)
WBC: 7.9 10*3/uL (ref 4.0–10.5)

## 2015-12-21 LAB — CULTURE, RESPIRATORY

## 2015-12-21 MED ORDER — SODIUM CHLORIDE 0.9% FLUSH
10.0000 mL | INTRAVENOUS | Status: DC | PRN
  Administered 2015-12-23: 10 mL
  Filled 2015-12-21: qty 40

## 2015-12-21 NOTE — Progress Notes (Signed)
Patient Name: Marcus Marcus [name] Date of Encounter: 12/21/2015  Primary Cardiologist: Marcus Marcus [name] (new)  Hospital Problem List     Principal Problem:   Atrial fibrillation with RVR [facility]) Active Problems:   Essential hypertension   Carotid stenosis   Diabetes (Marcus [name])   HLD (hyperlipidemia)   CVA (cerebral vascular accident) [facility])   Fall   Community acquired pneumonia of left lung   Compression fracture of vertebra (Marcus Marcus [name])   Urinary retention   New onset a-fib (HCC)   Lung mass   Lower extremity weakness   Bacteremia   T7 vertebral fracture ([facility])   Decreased sensation   Closed fracture of seventh thoracic vertebra (HCC)   Pressure injury of skin    Subjective  75 yo admitted 10/14 with weakness.  Hx of HTN,DM, TIA and GERD eval has revealed atrial fib, pneumonia,  [name],    Feeling well this AM.  Denies chest pain or shortness of breath. Able to move his toes some.  Inpatient Medications    Scheduled Meds: . atorvastatin  40 mg Oral q1800  .  ceFAZolin (ANCEF) IV  2 g Intravenous Q8H  . cholecalciferol  5,000 Units Oral QHS  . clopidogrel  75 mg Oral Daily  . dexamethasone  10 mg Intravenous Q6H  . digoxin  0.25 mg Oral Daily  . diltiazem  180 mg Oral Daily  . DULoxetine  30 mg Oral Q supper  . enoxaparin (LOVENOX) injection  40 mg Subcutaneous Q24H  . insulin aspart  0-9 Units Subcutaneous Q6H  . LORazepam  0.5 mg Oral Daily  . polyethylene glycol  17 g Oral Daily  . senna-docusate  1 tablet Oral BID  . timolol  1 drop Both Eyes BID   Continuous Infusions:   PRN Meds: acetaminophen, guaiFENesin-dextromethorphan, HYDROcodone-acetaminophen, ipratropium-albuterol, LORazepam, morphine injection, ondansetron (ZOFRAN) IV   Vital Signs    Vitals:   12/20/15 2052 12/21/15 0053 12/21/15 0440 12/21/15 0802  BP: (!) 141/73 (!) 146/76 (!) 162/86 (!) 144/94  Pulse: (!) 59 (!) 58 (!) 59 (!) 58  Resp: 14 15 (!) 21 15  Temp: 97.9 F (36.6 C) 98.1 F  (36.7 C) 98.5 F (36.9 C) 97.7 F (36.5 C)  TempSrc: Oral Oral Oral Oral  SpO2: 96% 96% 96% 97%  Weight:   216 lb 9.6 oz (98.2 kg)   Height:        Intake/Output Summary (Last 24 hours) at 12/21/15 0857 Last data filed at 12/21/15 0700  Gross per 24 hour  Intake              240 ml  Output             1200 ml  Net             -960 ml   Filed Weights   12/19/15 0818 12/20/15 0400 12/21/15 0440  Weight: 216 lb 1.6 oz (98 kg) 215 lb 6.4 oz (97.7 kg) 216 lb 9.6 oz (98.2 kg)    Physical Exam   GEN: Well nourished, well developed, in no acute distress.  HEENT: Grossly normal.  Neck: Supple, no JVD, carotid bruits, or masses. Cardiac: RRR, soft systolic murmur , rubs, or gallops. No clubbing, cyanosis.  Trace edema.  Radials/DP/PT 2+ and equal bilaterally.  Respiratory:  Respirations regular and unlabored, clear to auscultation bilaterally. GI: Soft, nontender, nondistended, BS + x 4. MS: no deformity or atrophy. Skin: warm and dry, no rash. Neuro:  Unable to move bilateral LEs.  Appears very weak  Psych: AAOx3.     Labs    CBC  Recent Labs  12/19/15 0356 12/21/15 0543  WBC 7.6 7.9  NEUTROABS 7.0  --   HGB 10.4* 11.5*  HCT 32.5* 35.3*  MCV 92.6 92.4  PLT 359 [id number]   Basic Metabolic Panel  Recent Labs  12/19/15 0356 12/21/15 0543  NA 139 135  K 4.4 4.7  CL 107 102  CO2 26 27  GLUCOSE 145* 138*  BUN 33* 31*  CREATININE 0.77 0.70  CALCIUM 8.2* 7.9*  MG 2.3  --    Liver Function Tests  Recent Labs  12/19/15 0356  AST 69*  ALT 37  ALKPHOS 114  BILITOT 0.3  PROT 5.4*  ALBUMIN 1.5*   No results for input(s): LIPASE, AMYLASE in the last 72 hours. Cardiac Enzymes No results for input(s): CKTOTAL, CKMB, CKMBINDEX, TROPONINI in the last 72 hours. BNP Invalid input(s): POCBNP D-Dimer No results for input(s): DDIMER in the last 72 hours. Hemoglobin A1C No results for input(s): HGBA1C in the last 72 hours. Fasting Lipid Panel No results for input(s):  CHOL, HDL, LDLCALC, TRIG, CHOLHDL, LDLDIRECT in the last 72 hours. Thyroid Function Tests No results for input(s): TSH, T4TOTAL, T3FREE, THYROIDAB in the last 72 hours.  Invalid input(s): FREET3  Telemetry   -personally reviewed. Junctional rhythm at 62.   ECG     Radiology    No results found.  Cardiac Studies   Echo 06/02/14: Study Conclusions  - Procedure narrative: Transthoracic echocardiography. Image quality was poor. The study was technically difficult, as a result of poor acoustic windows. - Left ventricle: The cavity size was normal. There was mild concentric hypertrophy. Systolic function was normal. The estimated ejection fraction was in the range of 60% to 65%. Images were inadequate for LV wall motion assessment. Doppler parameters are consistent with abnormal left ventricular relaxation (grade 1 diastolic dysfunction). - Mitral valve: Mildly calcified annulus. Normal thickness leaflets . - Left atrium: The atrium was mildly dilated. - Right atrium: The atrium was mildly dilated.  Patient Profile     Mr. Marcus Marcus [name] is  A 42M with hypertension, diabetes, TIA on clopidogrel, s/p CEA, and GERD here with several days of worsening malaise/fatigue and a recent fall who then developed Afib with RVR in the ED. He subsequently converted to NSR following uptitration of a diltiazem gtt and additional bolus dosing of metoprolol but is now back in atrial flutter.  Hospitalization also complicated by closed fracture of 7 thoracic vertebrae, R lower lobe lung mass, urinary retention and [name].    Assessment & Plan    # Atrial fibrillation with RVR: Mr. Marcus Marcus [name] is now in a junctional rhythm. Will  DC the Digoxin , Will get ECG today  Feeling a bit better    # [name]: TEE on 10/21 showed no endocarditis  .  # Carotid stenosis s/p CEA:  LDL is 30.  Continue atorvastatin.  Continue Plavix.   # Prior TIA: Resumed Plavix.  Continue  atorvastatin.   Signed, Mertie Moores, MD  12/21/2015, 8:57 AM

## 2015-12-21 NOTE — Progress Notes (Signed)
Peripherally Inserted Central Catheter/Midline Placement  The IV Nurse has discussed with the patient and/or persons authorized to consent for the patient, the purpose of this procedure and the potential benefits and risks involved with this procedure.  The benefits include less needle sticks, lab draws from the catheter, and the patient may be discharged home with the catheter. Risks include, but not limited to, infection, bleeding, blood clot (thrombus formation), and puncture of an artery; nerve damage and irregular heartbeat and possibility to perform a PICC exchange if needed/ordered by physician.  Alternatives to this procedure were also discussed.  Bard Power PICC patient education guide, fact sheet on infection prevention and patient information card has been provided to patient /or left at bedside.    PICC/Midline Placement Documentation        Henderson [place] 12/21/2015, 3:10 PM Phone consent obtained by Jake Samples, RN from wife

## 2015-12-21 NOTE — Progress Notes (Signed)
Progress Note    Marcus [name]  [id number] DOB: [birth year]  DOA: 12/14/2015 PCP: Jeanmarie Hubert, MD    Brief Narrative:   Chief complaint: F/U RLL lung mass/[name]/lower extremity weakness  Marcus [name] is an 75 y.o. male with PMH of hypertension, diabetes, TIA and GERD as well as recent UTI who was admitted 12/14/15 with chief complaint of new onset lower extremity weakness. In the ED, he developed atrial fibrillation with RVR and was placed on a Cardizem drip and given Lopressor. Urology was consulted for urinary retention and placed a Foley catheter. Broad-spectrum antibiotics were initiated. Imaging showed a closed fracture of T7 and a right lower lobe lung mass raising the question of whether he had a malignant process versus severe infection. CT-guided biopsy done 12/18/15. Radiation simulation done 12/17/15 but treatments have not been initiated pending pathology results. ID following for [name].  Assessment/Plan:   Principal Problems:   Lower extremity weakness/fall secondary to T7 compression fracture and direct extension of right lower lobe mass which could represent infection/CAP or malignancy, rule out discitis/osteomyelitis/[name]/decreased sensation Diagnostic uncertainty remains, but preliminary biopsy results so far do not show any evidence of malignancy. Blood cultures done on admission positive for MSSA, with surveillance blood cultures negative so far. The patient underwent CT-guided core biopsy of right lower lobe/posterior mediastinal mass lesion with pathology report pending at this time. The patient will need 6 weeks of cefazolin 2 g IV every 8 hours. Surveillance cultures negative, PICC line to be placed. TEE done 12/20/15, negative for endocarditis. CIR consulted for consideration of inpatient rehabilitation. Continue Decadron for now.    Atrial fibrillation with RVR ([name]) Patient was placed on a Cardizem drip on admission which was  discontinued secondary to low blood pressure. Currently on digoxin and oral Cardizem. Remains in sinus rhythm.  Active Problems:   Essential hypertension Does not appear to be on antihypertensives at home, and IV Cardizem dropped his blood pressure. Blood pressure currently stable on oral Cardizem, 180 mg daily.    Carotid stenosis History of CEA.    Diabetes ([name]) Hemoglobin A1c 5.8% on 09/16/15. Mildly hyperglycemic here, likely secondary to Decadron. CBGs 130-143, continue insulin sensitive SSI.    HLD (hyperlipidemia) Continue Lipitor.    CVA (cerebral vascular accident) ([name]) Continue Plavix.    Urinary retention Foley catheter placed by urology.    Pressure ulcer to the sacrum Evaluated by wound care nurse. Foam dressings ordered. Pressure reduction mattress ordered.   Family Communication/Anticipated D/C date and plan/Code Status   DVT prophylaxis: Lovenox ordered. Code Status: Full Code.  Family Communication: Wife/daughter at the bedside 12/20/15. Disposition Plan: CIR vs. SNF when stable and plan of care further defined.   Medical Consultants:    Cardiology  Radiation Oncology  Neurosurgery  Infectious Disease  Urology   Procedures:    Venous Dopplers 12/19/15: Negative.  2-D echo 12/17/15: EF 60-65 percent. Grade 2 diastolic dysfunction.  TEE 12/20/15: Negative for endocarditis.  Anti-Infectives:    Azithromycin 12/14/15 ---> 12/15/15  Rocephin 12/14/15 ---> 12/15/15  Cefazolin 12/15/15--->   Subjective:   The patient is a bit confused today, no family at the bedside. He says he slept well last night. Review of symptoms is negative for nausea/vomiting, shortness of breath, chest pain, and positive for back pain rated 7/10.  Objective:    Vitals:   12/20/15 1643 12/20/15 2052 12/21/15 0053 12/21/15 0440  BP: (!) 164/81 (!) 141/73 (!) 146/76 (!) 162/86  Pulse: 62 (!) 59 (!) 58 (!) 59  Resp: 17 14 15  (!) 21  Temp: 97.8 F (36.6 C)  97.9 F (36.6 C) 98.1 F (36.7 C) 98.5 F (36.9 C)  TempSrc: Oral Oral Oral Oral  SpO2: 98% 96% 96% 96%  Weight:    98.2 kg (216 lb 9.6 oz)  Height:        Intake/Output Summary (Last 24 hours) at 12/21/15 0753 Last data filed at 12/21/15 0700  Gross per 24 hour  Intake              240 ml  Output             1200 ml  Net             -960 ml   Filed Weights   12/19/15 0818 12/20/15 0400 12/21/15 0440  Weight: 98 kg (216 lb 1.6 oz) 97.7 kg (215 lb 6.4 oz) 98.2 kg (216 lb 9.6 oz)    Exam: General exam: Appears calm and comfortable.  Respiratory system: Diminished but clear. Respiratory effort normal. Cardiovascular system: S1 & S2 heard, RRR. No JVD,  rubs, gallops or clicks. No murmurs. Gastrointestinal system: Abdomen is nondistended, soft and nontender. No organomegaly or masses felt. Normal bowel sounds heard. Central nervous system: Alert, disoriented. Lower extremity paralysis. Extremities: No clubbing,  or cyanosis. No edema. Skin: Deep tissue injury to sacrum otherwise no rashes, lesions or ulcers. Psychiatry: Judgement and insight appear diminished. Mood & affect appropriate.   Data Reviewed:   I have personally reviewed following labs and imaging studies:  Labs: Basic Metabolic Panel:  Recent Labs Lab 12/16/15 0432 12/17/15 0337 12/18/15 0429 12/19/15 0356 12/21/15 0543  NA 134* 134* 134* 139 135  K 3.5 4.2 4.1 4.4 4.7  CL 99* 102 101 107 102  CO2 27 24 25 26 27   GLUCOSE 99 193* 148* 145* 138*  BUN 22* 27* 27* 33* 31*  CREATININE 0.89 0.80 0.84 0.77 0.70  CALCIUM 8.5* 8.4* 8.1* 8.2* 7.9*  MG 2.1 2.2 2.2 2.3  --   PHOS 3.2 3.0 3.3  --   --    GFR Estimated Creatinine Clearance: 94.1 mL/min (by C-G formula based on SCr of 0.7 mg/dL). Liver Function Tests:  Recent Labs Lab 12/16/15 0432 12/17/15 0337 12/18/15 0429 12/19/15 0356  AST 87* 116* 61* 69*  ALT 45 50 33 37  ALKPHOS 146* 138* 116 114  BILITOT 0.5 0.3 0.2* 0.3  PROT 6.0* 5.9* 5.7*  5.4*  ALBUMIN 1.5* 1.4* 1.5* 1.5*   Coagulation profile  Recent Labs Lab 12/14/15 1721  INR 1.04    CBC:  Recent Labs Lab 12/16/15 0432 12/17/15 0337 12/18/15 0429 12/19/15 0356 12/21/15 0543  WBC 11.1* 6.5 9.2 7.6 7.9  NEUTROABS 9.4* 5.9 8.4* 7.0  --   HGB 10.6* 11.4* 10.3* 10.4* 11.5*  HCT 33.0* 35.4* 32.0* 32.5* 35.3*  MCV 93.5 93.4 92.5 92.6 92.4  PLT 286 321 376 359 400   CBG:  Recent Labs Lab 12/19/15 2318 12/20/15 0533 12/20/15 1644 12/21/15 0032 12/21/15 0520  GLUCAP 143* 132* 141* 142* 130*   Sepsis Labs:  Recent Labs Lab 12/14/15 0906 12/14/15 1151  12/17/15 0337 12/18/15 0429 12/19/15 0356 12/21/15 0543  WBC  --   --   < > 6.5 9.2 7.6 7.9  LATICACIDVEN 1.07 1.54  --  1.2  --   --   --   < > = values in this interval not displayed.  Microbiology Recent  Results (from the past 240 hour(s))  Blood culture (routine x 2)     Status: Abnormal   Collection Time: 12/14/15  8:50 AM  Result Value Ref Range Status   Specimen Description BLOOD LEFT ARM  Final   Special Requests BOTTLES DRAWN AEROBIC AND ANAEROBIC 10CC  Final   Culture  Setup Time   Final    IN BOTH AEROBIC AND ANAEROBIC BOTTLES GRAM POSITIVE COCCI IN CLUSTERS CRITICAL RESULT CALLED TO, READ BACK BY AND VERIFIED WITH: TO TO SGULLETT(PHARD) BY MCAMPBELL 12/15/2015 AT 5:55AM    Culture (A)  Final    STAPHYLOCOCCUS AUREUS SUSCEPTIBILITIES PERFORMED ON PREVIOUS CULTURE WITHIN THE LAST 5 DAYS.    Report Status 12/17/2015 FINAL  Final  Blood culture (routine x 2)     Status: Abnormal   Collection Time: 12/14/15  9:00 AM  Result Value Ref Range Status   Specimen Description BLOOD RIGHT HAND  Final   Special Requests BOTTLES DRAWN AEROBIC AND ANAEROBIC 5CC  Final   Culture  Setup Time   Final    IN BOTH AEROBIC AND ANAEROBIC BOTTLES GRAM POSITIVE COCCI IN CLUSTERS Organism ID to follow CRITICAL RESULT CALLED TO, READ BACK BY AND VERIFIED WITH: TO TO SGULLETT(PHARD) BY MCAMPBELL  12/15/2015 AT 5:55AM    Culture STAPHYLOCOCCUS AUREUS (A)  Final   Report Status 12/17/2015 FINAL  Final   Organism ID, Bacteria STAPHYLOCOCCUS AUREUS  Final      Susceptibility   Staphylococcus aureus - MIC*    CIPROFLOXACIN <=0.5 SENSITIVE Sensitive     ERYTHROMYCIN <=0.25 SENSITIVE Sensitive     GENTAMICIN <=0.5 SENSITIVE Sensitive     OXACILLIN <=0.25 SENSITIVE Sensitive     TETRACYCLINE <=1 SENSITIVE Sensitive     VANCOMYCIN <=0.5 SENSITIVE Sensitive     TRIMETH/SULFA <=10 SENSITIVE Sensitive     CLINDAMYCIN <=0.25 SENSITIVE Sensitive     RIFAMPIN <=0.5 SENSITIVE Sensitive     Inducible Clindamycin NEGATIVE Sensitive     * STAPHYLOCOCCUS AUREUS  Blood Culture ID Panel (Reflexed)     Status: Abnormal   Collection Time: 12/14/15  9:00 AM  Result Value Ref Range Status   Enterococcus species NOT DETECTED NOT DETECTED Final   Vancomycin resistance NOT DETECTED NOT DETECTED Final   Listeria monocytogenes NOT DETECTED NOT DETECTED Final   Staphylococcus species DETECTED (A) NOT DETECTED Final    Comment: CRITICAL RESULT CALLED TO, READ BACK BY AND VERIFIED WITH: TO SGULLETT(PHARD) BY MCAMPBELL 12/15/2015 AT 5:55AM    Staphylococcus aureus DETECTED (A) NOT DETECTED Final    Comment: CRITICAL RESULT CALLED TO, READ BACK BY AND VERIFIED WITH: TO SGULLETT(PHARD) BY MCAMPBELL 12/15/2015 AT 5:55AM    Methicillin resistance NOT DETECTED NOT DETECTED Final   Streptococcus species NOT DETECTED NOT DETECTED Final   Streptococcus agalactiae NOT DETECTED NOT DETECTED Final   Streptococcus pneumoniae NOT DETECTED NOT DETECTED Final   Streptococcus pyogenes NOT DETECTED NOT DETECTED Final   Acinetobacter baumannii NOT DETECTED NOT DETECTED Final   Enterobacteriaceae species NOT DETECTED NOT DETECTED Final   Enterobacter cloacae complex NOT DETECTED NOT DETECTED Final   Escherichia coli NOT DETECTED NOT DETECTED Final   Klebsiella oxytoca NOT DETECTED NOT DETECTED Final   Klebsiella  pneumoniae NOT DETECTED NOT DETECTED Final   Proteus species NOT DETECTED NOT DETECTED Final   Serratia marcescens NOT DETECTED NOT DETECTED Final   Carbapenem resistance NOT DETECTED NOT DETECTED Final   Haemophilus influenzae NOT DETECTED NOT DETECTED Final   Neisseria meningitidis NOT DETECTED  NOT DETECTED Final   Pseudomonas aeruginosa NOT DETECTED NOT DETECTED Final   Candida albicans NOT DETECTED NOT DETECTED Final   Candida glabrata NOT DETECTED NOT DETECTED Final   Candida krusei NOT DETECTED NOT DETECTED Final   Candida parapsilosis NOT DETECTED NOT DETECTED Final   Candida tropicalis NOT DETECTED NOT DETECTED Final  Urine culture     Status: Abnormal   Collection Time: 12/14/15  4:22 PM  Result Value Ref Range Status   Specimen Description URINE, RANDOM  Final   Special Requests NONE  Final   Culture 20,000 COLONIES/mL STAPHYLOCOCCUS AUREUS (A)  Final   Report Status 12/17/2015 FINAL  Final   Organism ID, Bacteria STAPHYLOCOCCUS AUREUS (A)  Final      Susceptibility   Staphylococcus aureus - MIC*    CIPROFLOXACIN <=0.5 SENSITIVE Sensitive     GENTAMICIN <=0.5 SENSITIVE Sensitive     NITROFURANTOIN <=16 SENSITIVE Sensitive     OXACILLIN <=0.25 SENSITIVE Sensitive     TETRACYCLINE <=1 SENSITIVE Sensitive     VANCOMYCIN 1 SENSITIVE Sensitive     TRIMETH/SULFA <=10 SENSITIVE Sensitive     CLINDAMYCIN <=0.25 SENSITIVE Sensitive     RIFAMPIN <=0.5 SENSITIVE Sensitive     Inducible Clindamycin NEGATIVE Sensitive     * 20,000 COLONIES/mL STAPHYLOCOCCUS AUREUS  MRSA PCR Screening     Status: None   Collection Time: 12/14/15 11:57 PM  Result Value Ref Range Status   MRSA by PCR NEGATIVE NEGATIVE Final    Comment:        The GeneXpert MRSA Assay (FDA approved for NASAL specimens only), is one component of a comprehensive MRSA colonization surveillance program. It is not intended to diagnose MRSA infection nor to guide or monitor treatment for MRSA infections.   Culture,  blood (Routine X 2) w Reflex to ID Panel     Status: None (Preliminary result)   Collection Time: 12/17/15  7:29 PM  Result Value Ref Range Status   Specimen Description BLOOD LEFT HAND  Final   Special Requests BOTTLES DRAWN AEROBIC ONLY 5CC  Final   Culture NO GROWTH 3 DAYS  Final   Report Status PENDING  Incomplete  Culture, blood (Routine X 2) w Reflex to ID Panel     Status: None (Preliminary result)   Collection Time: 12/17/15  7:45 PM  Result Value Ref Range Status   Specimen Description BLOOD LEFT ANTECUBITAL  Final   Special Requests BOTTLES DRAWN AEROBIC ONLY 5CC  Final   Culture NO GROWTH 3 DAYS  Final   Report Status PENDING  Incomplete  Culture, expectorated sputum-assessment     Status: None   Collection Time: 12/18/15  9:44 AM  Result Value Ref Range Status   Specimen Description EXPECTORATED SPUTUM  Final   Special Requests NONE  Final   Sputum evaluation   Final    THIS SPECIMEN IS ACCEPTABLE. RESPIRATORY CULTURE REPORT TO FOLLOW.   Report Status 12/18/2015 FINAL  Final  Culture, respiratory (NON-Expectorated)     Status: None (Preliminary result)   Collection Time: 12/18/15  9:44 AM  Result Value Ref Range Status   Specimen Description EXPECTORATED SPUTUM  Final   Special Requests NONE  Final   Gram Stain   Final    ABUNDANT WBC PRESENT, PREDOMINANTLY PMN FEW BUDDING YEAST SEEN RARE GRAM POSITIVE COCCI IN PAIRS RARE GRAM NEGATIVE COCCOBACILLI    Culture   Final    RARE CANDIDA ALBICANS RARE STAPHYLOCOCCUS AUREUS    Report Status PENDING  Incomplete  Acid Fast Smear (AFB)     Status: None   Collection Time: 12/18/15 11:48 AM  Result Value Ref Range Status   AFB Specimen Processing Concentration  Final   Acid Fast Smear Negative  Final    Comment: (NOTE) Performed At: [facility] [place], [place] [id number] Lindon Romp MD [id number]    Source (AFB) ASPIRATE  Final    Comment:  RLL POSTERIOR MEDIASTINAL MASS AT T7     Aerobic/Anaerobic Culture (surgical/deep wound)     Status: None (Preliminary result)   Collection Time: 12/18/15 11:48 AM  Result Value Ref Range Status   Specimen Description ASPIRATE  Final   Special Requests RLL POSTERIOR MEDIASTINAL MASS AT T7  Final   Gram Stain   Final    RARE WBC PRESENT, PREDOMINANTLY PMN NO ORGANISMS SEEN    Culture   Final    FEW STAPHYLOCOCCUS AUREUS NO ANAEROBES ISOLATED; CULTURE IN PROGRESS FOR 5 DAYS    Report Status PENDING  Incomplete    Radiology: No results found.  Medications:   . atorvastatin  40 mg Oral q1800  .  ceFAZolin (ANCEF) IV  2 g Intravenous Q8H  . cholecalciferol  5,000 Units Oral QHS  . clopidogrel  75 mg Oral Daily  . dexamethasone  10 mg Intravenous Q6H  . digoxin  0.25 mg Oral Daily  . diltiazem  180 mg Oral Daily  . DULoxetine  30 mg Oral Q supper  . enoxaparin (LOVENOX) injection  40 mg Subcutaneous Q24H  . insulin aspart  0-9 Units Subcutaneous Q6H  . LORazepam  0.5 mg Oral Daily  . polyethylene glycol  17 g Oral Daily  . senna-docusate  1 tablet Oral BID  . timolol  1 drop Both Eyes BID   Continuous Infusions:    Medical decision making is of moderate complexity and this patient is at moderate risk of deterioration, therefore this is a level 2 visit.    LOS: 7 days   [facility] Pager [phone]. If unable to reach me by pager, please call my cell phone at [phone].  *Please refer to [email or web address], password [email or web address] to get updated schedule on who will round on this patient, as hospitalists switch teams weekly. If 7PM-7AM, please contact night-coverage at [email or web address], password [email or web address] for any overnight needs.  12/21/2015, 7:53 AM

## 2015-12-22 ENCOUNTER — Ambulatory Visit: Payer: Medicare Other

## 2015-12-22 LAB — GLUCOSE, CAPILLARY
GLUCOSE-CAPILLARY: 113 mg/dL — AB (ref 65–99)
Glucose-Capillary: 120 mg/dL — ABNORMAL HIGH (ref 65–99)
Glucose-Capillary: 130 mg/dL — ABNORMAL HIGH (ref 65–99)

## 2015-12-22 LAB — CULTURE, BLOOD (ROUTINE X 2)
CULTURE: NO GROWTH
Culture: NO GROWTH

## 2015-12-22 MED ORDER — VANCOMYCIN HCL 10 G IV SOLR
2000.0000 mg | Freq: Once | INTRAVENOUS | Status: AC
  Administered 2015-12-22: 2000 mg via INTRAVENOUS
  Filled 2015-12-22: qty 2000

## 2015-12-22 MED ORDER — VANCOMYCIN HCL IN DEXTROSE 1-5 GM/200ML-% IV SOLN
1000.0000 mg | Freq: Three times a day (TID) | INTRAVENOUS | Status: DC
Start: 2015-12-22 — End: 2015-12-22
  Filled 2015-12-22 (×2): qty 200

## 2015-12-22 MED ORDER — VANCOMYCIN HCL 10 G IV SOLR
1250.0000 mg | Freq: Two times a day (BID) | INTRAVENOUS | Status: DC
  Administered 2015-12-22 – 2015-12-24 (×4): 1250 mg via INTRAVENOUS
  Filled 2015-12-22 (×5): qty 1250

## 2015-12-22 MED ORDER — APIXABAN 5 MG PO TABS
5.0000 mg | ORAL_TABLET | Freq: Two times a day (BID) | ORAL | Status: DC
  Administered 2015-12-22 – 2015-12-24 (×5): 5 mg via ORAL
  Filled 2015-12-22 (×5): qty 1

## 2015-12-22 NOTE — Progress Notes (Addendum)
Pharmacy Antibiotic Note  Marcus [name] is a 75 y.o. male admitted on 12/14/2015 with weakness/fall/pain. Since then patient was found to have [name] and has been being treated with cefazolin. Pharmacy has been consulted for vancomycin dosing on 10/22 due to sputum cultures resulting in MRSA.  Patient has been afebrile with WBC of 7.9k/uL. CrCL~ 94 mL/min today and has been relatively stable. Will need to monitor closely given age and risk of accumulation.    Plan: Start vancomycin 2000mg  IV once, then 1250mg  IV every 12 hours Goal trough 15-20 Monitor renal function, clinical progress, and vancomycin trough at steady state Stop cefazolin  Height: 5' 7.5" (171.5 cm) Weight: 218 lb (98.9 kg) IBW/kg (Calculated) : 67.25  Temp (24hrs), Avg:97.5 F (36.4 C), Min:97 F (36.1 C), Max:97.9 F (36.6 C)   Recent Labs Lab 12/16/15 0432 12/17/15 0337 12/18/15 0429 12/19/15 0356 12/21/15 0543  WBC 11.1* 6.5 9.2 7.6 7.9  CREATININE 0.89 0.80 0.84 0.77 0.70  LATICACIDVEN  --  1.2  --   --   --     Estimated Creatinine Clearance: 94.3 mL/min (by C-G formula based on SCr of 0.7 mg/dL).    Allergies  Allergen Reactions  . Codeine Other (See Comments)    constipation  . Demerol [Meperidine] Other (See Comments)    Drops blood pressure   . Contrast Media [Iodinated Diagnostic Agents] Nausea And Vomiting  . Gabapentin Other (See Comments)    Suicidal, depression  . Other Other (See Comments)    All narcotics make pt hallucinate    Antimicrobials this admission: 10/14 CTX >> 10/15 10/14 Azithromycin >> 10/15 10/15 Cefazolin >> 10/22 10/22 Vancomycin >>   Dose adjustments this admission: N/A  Microbiology results: 10/14 BCx: BCID +MSSA 10/14 UCx: mssa  10/15 MRSA PCR: neg 10/17 BCx: NGTD 10/18 tissue aspirate:MSSA 10/18 Sputum Cx: MRSA   Thank you for allowing pharmacy to be a part of this patient's care.  Demetrius Charity, PharmD Acute Care Pharmacy Resident   Pager: [phone] 12/22/2015

## 2015-12-22 NOTE — Progress Notes (Signed)
Progress Note    Marcus [name]  [id number] DOB: [birth year]  DOA: 12/14/2015 PCP: Jeanmarie Hubert, MD    Brief Narrative:   Chief complaint: F/U RLL lung mass/[name]/lower extremity weakness  Marcus [name] is an 75 y.o. male with PMH of hypertension, diabetes, TIA and GERD as well as recent UTI who was admitted 12/14/15 with chief complaint of new onset lower extremity weakness. In the ED, he developed atrial fibrillation with RVR and was placed on a Cardizem drip and given Lopressor. Urology was consulted for urinary retention and placed a Foley catheter. Broad-spectrum antibiotics were initiated. Imaging showed a closed fracture of T7 and a right lower lobe lung mass raising the question of whether he had a malignant process versus severe infection. CT-guided biopsy done 12/18/15. Radiation simulation done 12/17/15 but treatments have not been initiated pending pathology results. ID following for [name].  Assessment/Plan:   Principal Problems:   Lower extremity weakness/fall secondary to T7 compression fracture and direct extension of right lower lobe mass which could represent infection/CAP or malignancy, rule out discitis/osteomyelitis/[name]/decreased sensation Diagnostic uncertainty remains, but preliminary biopsy results so far do not show any evidence of malignancy. Blood cultures done on admission positive for MSSA, with surveillance blood cultures negative so far. The patient underwent CT-guided core biopsy of right lower lobe/posterior mediastinal mass lesion with pathology report pending at this time. The patient will need 6 weeks of cefazolin 2 g IV every 8 hours. Surveillance cultures negative, PICC line Placed 12/21/15. TEE done 12/20/15, negative for endocarditis. CIR consulted for consideration of inpatient rehabilitation. Continue Decadron for now.    Atrial fibrillation with RVR ([place]) Patient was placed on a Cardizem drip on admission which  was discontinued secondary to low blood pressure. Currently on digoxin and oral Cardizem. Was in a junctional rhythm 12/21/15, so cardiologist discontinued digoxin. Will resume Eliquis.  Active Problems:   Essential hypertension Does not appear to be on antihypertensives at home, and IV Cardizem dropped his blood pressure. Blood pressure currently stable on oral Cardizem, 180 mg daily.    Carotid stenosis History of CEA.    Diabetes ([place]) Hemoglobin A1c 5.8% on 09/16/15. Mildly hyperglycemic here, likely secondary to Decadron. CBGs 130-143, continue insulin sensitive SSI.    HLD (hyperlipidemia) Continue Lipitor.    CVA (cerebral vascular accident) ([place]) Continue Plavix.    Urinary retention Foley catheter placed by urology.    Pressure ulcer to the sacrum Evaluated by wound care nurse. Foam dressings ordered. Pressure reduction mattress ordered.   Family Communication/Anticipated D/C date and plan/Code Status   DVT prophylaxis: Lovenox ordered. Code Status: Full Code.  Family Communication: Wife/daughter at the bedside 12/20/15. Disposition Plan: CIR vs. SNF when stable and plan of care further defined.   Medical Consultants:    Cardiology  Radiation Oncology  Neurosurgery  Infectious Disease  Urology   Procedures:    Venous Dopplers 12/19/15: Negative.  2-D echo 12/17/15: EF 60-65 percent. Grade 2 diastolic dysfunction.  TEE 12/20/15: Negative for endocarditis.  Anti-Infectives:    Azithromycin 12/14/15 ---> 12/15/15  Rocephin 12/14/15 ---> 12/15/15  Cefazolin 12/15/15--->   Subjective:   The patient is confused today, no family at the bedside. Says he feels "dazed". Review of symptoms is negative for nausea/vomiting, shortness of breath, chest pain, and positive for back pain.  Objective:    Vitals:   12/21/15 2028 12/21/15 2336 12/22/15 0500 12/22/15 0700  BP: (!) 160/87 (!) 144/91 (!) 155/90  Pulse: (!) 58  60   Resp: 16     Temp:  97.6 F (36.4 C) 97 F (36.1 C)    TempSrc: Oral Oral    SpO2: 96% 96%    Weight:    98.9 kg (218 lb)  Height:        Intake/Output Summary (Last 24 hours) at 12/22/15 0802 Last data filed at 12/21/15 1835  Gross per 24 hour  Intake              560 ml  Output              900 ml  Net             -340 ml   Filed Weights   12/20/15 0400 12/21/15 0440 12/22/15 0700  Weight: 97.7 kg (215 lb 6.4 oz) 98.2 kg (216 lb 9.6 oz) 98.9 kg (218 lb)    Exam: General exam: Appears calm and comfortable.  Respiratory system: Diminished but clear. Respiratory effort normal. Cardiovascular system: S1 & S2 heard, RRR. No JVD,  rubs, gallops or clicks. No murmurs. Gastrointestinal system: Abdomen is nondistended, soft and nontender. No organomegaly or masses felt. Normal bowel sounds heard. Central nervous system: Alert, disoriented. Lower extremity paralysis. Extremities: No clubbing,  or cyanosis. No edema. Skin: Deep tissue injury to sacrum otherwise no rashes, lesions or ulcers. Psychiatry: Judgement and insight appear diminished. Mood & affect appropriate.   Data Reviewed:   I have personally reviewed following labs and imaging studies:  Labs: Basic Metabolic Panel:  Recent Labs Lab 12/16/15 0432 12/17/15 0337 12/18/15 0429 12/19/15 0356 12/21/15 0543  NA 134* 134* 134* 139 135  K 3.5 4.2 4.1 4.4 4.7  CL 99* 102 101 107 102  CO2 27 24 25 26 27   GLUCOSE 99 193* 148* 145* 138*  BUN 22* 27* 27* 33* 31*  CREATININE 0.89 0.80 0.84 0.77 0.70  CALCIUM 8.5* 8.4* 8.1* 8.2* 7.9*  MG 2.1 2.2 2.2 2.3  --   PHOS 3.2 3.0 3.3  --   --    GFR Estimated Creatinine Clearance: 94.3 mL/min (by C-G formula based on SCr of 0.7 mg/dL). Liver Function Tests:  Recent Labs Lab 12/16/15 0432 12/17/15 0337 12/18/15 0429 12/19/15 0356  AST 87* 116* 61* 69*  ALT 45 50 33 37  ALKPHOS 146* 138* 116 114  BILITOT 0.5 0.3 0.2* 0.3  PROT 6.0* 5.9* 5.7* 5.4*  ALBUMIN 1.5* 1.4* 1.5* 1.5*    Coagulation profile No results for input(s): INR, PROTIME in the last 168 hours.  CBC:  Recent Labs Lab 12/16/15 0432 12/17/15 0337 12/18/15 0429 12/19/15 0356 12/21/15 0543  WBC 11.1* 6.5 9.2 7.6 7.9  NEUTROABS 9.4* 5.9 8.4* 7.0  --   HGB 10.6* 11.4* 10.3* 10.4* 11.5*  HCT 33.0* 35.4* 32.0* 32.5* 35.3*  MCV 93.5 93.4 92.5 92.6 92.4  PLT 286 321 376 359 400   CBG:  Recent Labs Lab 12/21/15 0520 12/21/15 1304 12/21/15 1700 12/21/15 2316 12/22/15 0432  GLUCAP 130* 128* 118* 160* 120*   Sepsis Labs:  Recent Labs Lab 12/17/15 0337 12/18/15 0429 12/19/15 0356 12/21/15 0543  WBC 6.5 9.2 7.6 7.9  LATICACIDVEN 1.2  --   --   --     Microbiology Recent Results (from the past 240 hour(s))  Blood culture (routine x 2)     Status: Abnormal   Collection Time: 12/14/15  8:50 AM  Result Value Ref Range Status   Specimen Description BLOOD LEFT ARM  Final   Special Requests BOTTLES DRAWN AEROBIC AND ANAEROBIC 10CC  Final   Culture  Setup Time   Final    IN BOTH AEROBIC AND ANAEROBIC BOTTLES GRAM POSITIVE COCCI IN CLUSTERS CRITICAL RESULT CALLED TO, READ BACK BY AND VERIFIED WITH: TO TO SGULLETT(PHARD) BY MCAMPBELL 12/15/2015 AT 5:55AM    Culture (A)  Final    STAPHYLOCOCCUS AUREUS SUSCEPTIBILITIES PERFORMED ON PREVIOUS CULTURE WITHIN THE LAST 5 DAYS.    Report Status 12/17/2015 FINAL  Final  Blood culture (routine x 2)     Status: Abnormal   Collection Time: 12/14/15  9:00 AM  Result Value Ref Range Status   Specimen Description BLOOD RIGHT HAND  Final   Special Requests BOTTLES DRAWN AEROBIC AND ANAEROBIC 5CC  Final   Culture  Setup Time   Final    IN BOTH AEROBIC AND ANAEROBIC BOTTLES GRAM POSITIVE COCCI IN CLUSTERS Organism ID to follow CRITICAL RESULT CALLED TO, READ BACK BY AND VERIFIED WITH: TO TO SGULLETT(PHARD) BY MCAMPBELL 12/15/2015 AT 5:55AM    Culture STAPHYLOCOCCUS AUREUS (A)  Final   Report Status 12/17/2015 FINAL  Final   Organism ID, Bacteria  STAPHYLOCOCCUS AUREUS  Final      Susceptibility   Staphylococcus aureus - MIC*    CIPROFLOXACIN <=0.5 SENSITIVE Sensitive     ERYTHROMYCIN <=0.25 SENSITIVE Sensitive     GENTAMICIN <=0.5 SENSITIVE Sensitive     OXACILLIN <=0.25 SENSITIVE Sensitive     TETRACYCLINE <=1 SENSITIVE Sensitive     VANCOMYCIN <=0.5 SENSITIVE Sensitive     TRIMETH/SULFA <=10 SENSITIVE Sensitive     CLINDAMYCIN <=0.25 SENSITIVE Sensitive     RIFAMPIN <=0.5 SENSITIVE Sensitive     Inducible Clindamycin NEGATIVE Sensitive     * STAPHYLOCOCCUS AUREUS  Blood Culture ID Panel (Reflexed)     Status: Abnormal   Collection Time: 12/14/15  9:00 AM  Result Value Ref Range Status   Enterococcus species NOT DETECTED NOT DETECTED Final   Vancomycin resistance NOT DETECTED NOT DETECTED Final   Listeria monocytogenes NOT DETECTED NOT DETECTED Final   Staphylococcus species DETECTED (A) NOT DETECTED Final    Comment: CRITICAL RESULT CALLED TO, READ BACK BY AND VERIFIED WITH: TO SGULLETT(PHARD) BY MCAMPBELL 12/15/2015 AT 5:55AM    Staphylococcus aureus DETECTED (A) NOT DETECTED Final    Comment: CRITICAL RESULT CALLED TO, READ BACK BY AND VERIFIED WITH: TO SGULLETT(PHARD) BY MCAMPBELL 12/15/2015 AT 5:55AM    Methicillin resistance NOT DETECTED NOT DETECTED Final   Streptococcus species NOT DETECTED NOT DETECTED Final   Streptococcus agalactiae NOT DETECTED NOT DETECTED Final   Streptococcus pneumoniae NOT DETECTED NOT DETECTED Final   Streptococcus pyogenes NOT DETECTED NOT DETECTED Final   Acinetobacter baumannii NOT DETECTED NOT DETECTED Final   Enterobacteriaceae species NOT DETECTED NOT DETECTED Final   Enterobacter cloacae complex NOT DETECTED NOT DETECTED Final   Escherichia coli NOT DETECTED NOT DETECTED Final   Klebsiella oxytoca NOT DETECTED NOT DETECTED Final   Klebsiella pneumoniae NOT DETECTED NOT DETECTED Final   Proteus species NOT DETECTED NOT DETECTED Final   Serratia marcescens NOT DETECTED NOT  DETECTED Final   Carbapenem resistance NOT DETECTED NOT DETECTED Final   Haemophilus influenzae NOT DETECTED NOT DETECTED Final   Neisseria meningitidis NOT DETECTED NOT DETECTED Final   Pseudomonas aeruginosa NOT DETECTED NOT DETECTED Final   Candida albicans NOT DETECTED NOT DETECTED Final   Candida glabrata NOT DETECTED NOT DETECTED Final   Candida krusei NOT DETECTED NOT DETECTED Final  Candida parapsilosis NOT DETECTED NOT DETECTED Final   Candida tropicalis NOT DETECTED NOT DETECTED Final  Urine culture     Status: Abnormal   Collection Time: 12/14/15  4:22 PM  Result Value Ref Range Status   Specimen Description URINE, RANDOM  Final   Special Requests NONE  Final   Culture 20,000 COLONIES/mL STAPHYLOCOCCUS AUREUS (A)  Final   Report Status 12/17/2015 FINAL  Final   Organism ID, Bacteria STAPHYLOCOCCUS AUREUS (A)  Final      Susceptibility   Staphylococcus aureus - MIC*    CIPROFLOXACIN <=0.5 SENSITIVE Sensitive     GENTAMICIN <=0.5 SENSITIVE Sensitive     NITROFURANTOIN <=16 SENSITIVE Sensitive     OXACILLIN <=0.25 SENSITIVE Sensitive     TETRACYCLINE <=1 SENSITIVE Sensitive     VANCOMYCIN 1 SENSITIVE Sensitive     TRIMETH/SULFA <=10 SENSITIVE Sensitive     CLINDAMYCIN <=0.25 SENSITIVE Sensitive     RIFAMPIN <=0.5 SENSITIVE Sensitive     Inducible Clindamycin NEGATIVE Sensitive     * 20,000 COLONIES/mL STAPHYLOCOCCUS AUREUS  MRSA PCR Screening     Status: None   Collection Time: 12/14/15 11:57 PM  Result Value Ref Range Status   MRSA by PCR NEGATIVE NEGATIVE Final    Comment:        The GeneXpert MRSA Assay (FDA approved for NASAL specimens only), is one component of a comprehensive MRSA colonization surveillance program. It is not intended to diagnose MRSA infection nor to guide or monitor treatment for MRSA infections.   Culture, blood (Routine X 2) w Reflex to ID Panel     Status: None (Preliminary result)   Collection Time: 12/17/15  7:29 PM  Result Value  Ref Range Status   Specimen Description BLOOD LEFT HAND  Final   Special Requests BOTTLES DRAWN AEROBIC ONLY 5CC  Final   Culture NO GROWTH 4 DAYS  Final   Report Status PENDING  Incomplete  Culture, blood (Routine X 2) w Reflex to ID Panel     Status: None (Preliminary result)   Collection Time: 12/17/15  7:45 PM  Result Value Ref Range Status   Specimen Description BLOOD LEFT ANTECUBITAL  Final   Special Requests BOTTLES DRAWN AEROBIC ONLY 5CC  Final   Culture NO GROWTH 4 DAYS  Final   Report Status PENDING  Incomplete  Culture, expectorated sputum-assessment     Status: None   Collection Time: 12/18/15  9:44 AM  Result Value Ref Range Status   Specimen Description EXPECTORATED SPUTUM  Final   Special Requests NONE  Final   Sputum evaluation   Final    THIS SPECIMEN IS ACCEPTABLE. RESPIRATORY CULTURE REPORT TO FOLLOW.   Report Status 12/18/2015 FINAL  Final  Culture, respiratory (NON-Expectorated)     Status: None   Collection Time: 12/18/15  9:44 AM  Result Value Ref Range Status   Specimen Description EXPECTORATED SPUTUM  Final   Special Requests NONE  Final   Gram Stain   Final    ABUNDANT WBC PRESENT, PREDOMINANTLY PMN FEW BUDDING YEAST SEEN RARE GRAM POSITIVE COCCI IN PAIRS RARE GRAM NEGATIVE COCCOBACILLI    Culture   Final    RARE CANDIDA ALBICANS RARE METHICILLIN RESISTANT STAPHYLOCOCCUS AUREUS    Report Status 12/21/2015 FINAL  Final   Organism ID, Bacteria METHICILLIN RESISTANT STAPHYLOCOCCUS AUREUS  Final      Susceptibility   Methicillin resistant staphylococcus aureus - MIC*    CIPROFLOXACIN <=0.5 SENSITIVE Sensitive     ERYTHROMYCIN <=0.25  SENSITIVE Sensitive     GENTAMICIN <=0.5 SENSITIVE Sensitive     OXACILLIN <=0.25 RESISTANT Resistant     TETRACYCLINE <=1 SENSITIVE Sensitive     VANCOMYCIN 1 SENSITIVE Sensitive     TRIMETH/SULFA <=10 SENSITIVE Sensitive     CLINDAMYCIN <=0.25 SENSITIVE Sensitive     RIFAMPIN <=0.5 SENSITIVE Sensitive     Inducible  Clindamycin NEGATIVE Sensitive     * RARE METHICILLIN RESISTANT STAPHYLOCOCCUS AUREUS  Acid Fast Smear (AFB)     Status: None   Collection Time: 12/18/15 11:48 AM  Result Value Ref Range Status   AFB Specimen Processing Concentration  Final   Acid Fast Smear Negative  Final    Comment: (NOTE) Performed At: [facility] [place], [place] [id number] Lindon Romp MD [id number]    Source (AFB) ASPIRATE  Final    Comment:  RLL POSTERIOR MEDIASTINAL MASS AT T7   Aerobic/Anaerobic Culture (surgical/deep wound)     Status: None (Preliminary result)   Collection Time: 12/18/15 11:48 AM  Result Value Ref Range Status   Specimen Description ASPIRATE  Final   Special Requests RLL POSTERIOR MEDIASTINAL MASS AT T7  Final   Gram Stain   Final    RARE WBC PRESENT, PREDOMINANTLY PMN NO ORGANISMS SEEN    Culture   Final    FEW STAPHYLOCOCCUS AUREUS NO ANAEROBES ISOLATED; CULTURE IN PROGRESS FOR 5 DAYS    Report Status PENDING  Incomplete   Organism ID, Bacteria STAPHYLOCOCCUS AUREUS  Final      Susceptibility   Staphylococcus aureus - MIC*    CIPROFLOXACIN <=0.5 SENSITIVE Sensitive     ERYTHROMYCIN <=0.25 SENSITIVE Sensitive     GENTAMICIN <=0.5 SENSITIVE Sensitive     OXACILLIN <=0.25 SENSITIVE Sensitive     TETRACYCLINE <=1 SENSITIVE Sensitive     VANCOMYCIN 1 SENSITIVE Sensitive     TRIMETH/SULFA <=10 SENSITIVE Sensitive     CLINDAMYCIN <=0.25 SENSITIVE Sensitive     RIFAMPIN <=0.5 SENSITIVE Sensitive     Inducible Clindamycin NEGATIVE Sensitive     * FEW STAPHYLOCOCCUS AUREUS    Radiology: No results found.  Medications:   . atorvastatin  40 mg Oral q1800  .  ceFAZolin (ANCEF) IV  2 g Intravenous Q8H  . cholecalciferol  5,000 Units Oral QHS  . clopidogrel  75 mg Oral Daily  . dexamethasone  10 mg Intravenous Q6H  . diltiazem  180 mg Oral Daily  . DULoxetine  30 mg Oral Q supper  . enoxaparin (LOVENOX) injection  40 mg Subcutaneous Q24H  .  insulin aspart  0-9 Units Subcutaneous Q6H  . LORazepam  0.5 mg Oral Daily  . polyethylene glycol  17 g Oral Daily  . senna-docusate  1 tablet Oral BID  . timolol  1 drop Both Eyes BID   Continuous Infusions:    Medical decision making is of moderate complexity and this patient is at moderate risk of deterioration, therefore this is a level 2 visit.    LOS: 8 days   [facility] Pager [phone]. If unable to reach me by pager, please call my cell phone at [phone].  *Please refer to [email or web address], password [email or web address] to get updated schedule on who will round on this patient, as hospitalists switch teams weekly. If 7PM-7AM, please contact night-coverage at [email or web address], password [email or web address] for any overnight needs.  12/22/2015, 8:02 AM

## 2015-12-22 NOTE — Progress Notes (Signed)
ANTICOAGULATION CONSULT NOTE - Initial Consult  Pharmacy Consult for Apixaban Indication: atrial fibrillation  Allergies  Allergen Reactions  . Codeine Other (See Comments)    constipation  . Demerol [Meperidine] Other (See Comments)    Drops blood pressure   . Contrast Media [Iodinated Diagnostic Agents] Nausea And Vomiting  . Gabapentin Other (See Comments)    Suicidal, depression  . Other Other (See Comments)    All narcotics make pt hallucinate    Patient Measurements: Height: 5' 7.5" (171.5 cm) Weight: 218 lb (98.9 kg) IBW/kg (Calculated) : 67.25  Vital Signs: Temp: 97 F (36.1 C) (10/21 2336) Temp Source: Oral (10/21 2336) BP: 155/90 (10/22 0500) Pulse Rate: 60 (10/22 0500)  Labs:  Recent Labs  12/21/15 0543  HGB 11.5*  HCT 35.3*  PLT 400  CREATININE 0.70    Estimated Creatinine Clearance: 94.3 mL/min (by C-G formula based on SCr of 0.7 mg/dL).   Medical History: Past Medical History:  Diagnosis Date  . Anxiety state, unspecified   . Calculus of kidney   . 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 Complication of anesthesia    " I woke up in the recovery room with tube in throat and panicked."  . 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 Herpes zoster without mention of complication   . History of hiatal hernia   . Hypertrophy of prostate with urinary obstruction and other lower urinary tract symptoms (LUTS)   . Insomnia, unspecified   . Lumbago   . Other and unspecified hyperlipidemia   . Pneumonia    bronchial  . Reflux esophagitis   . Seasonal allergies   . Stroke ([place])   . TIA (transient ischemic attack)   . Type II or unspecified type diabetes mellitus without mention of complication, not stated as uncontrolled   . Unspecified essential hypertension   . Unspecified vitamin D deficiency   . Urethral stricture unspecified   . Urinary frequency     Medications:  Prescriptions Prior to Admission  Medication Sig Dispense Refill Last  Dose  . atorvastatin (LIPITOR) 40 MG tablet TAKE ONE TABLET BY MOUTH ONCE DAILY FOR CHOLESTEROL (Patient taking differently: TAKE ONE TABLET BY MOUTH ONCE DAILY AT BEDTIME FOR CHOLESTEROL) 30 tablet 3 2-3 nights ago at Unknown time  . Cholecalciferol (VITAMIN D3) 5000 units TABS Take 5,000 Units by mouth at bedtime.   few weeks ago  . clopidogrel (PLAVIX) 75 MG tablet TAKE 1 TABLET BY MOUTH DAILY 90 tablet 3 12/13/2015 at Unknown time  . 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 HYDROcodone-acetaminophen (NORCO/VICODIN) 5-325 MG tablet Take 0.5-1 tablets by mouth every 6 (six) hours as needed for moderate pain.    12/14/2015 at 330  . LORazepam (ATIVAN) 0.5 MG tablet Take 0.5 mg by mouth See admin instructions. Take 1 tablet (0.5 mg) by mouth every morning, may also take 1 tablet 2 more times during the day as needed for anxiety  0 12/14/2015 at Unknown time  . nicotine polacrilex (COMMIT) 2 MG lozenge Take 2 mg by mouth every 2 (two) hours.    12/13/2015 at Unknown time  . ondansetron (ZOFRAN) 4 MG tablet Take 1 tablet (4 mg total) by mouth every 8 (eight) hours as  needed for nausea or vomiting. 20 tablet 0 few weeks ago at Unknown time  . timolol (BETIMOL) 0.5 % ophthalmic solution Place 1 drop into both eyes 2 (two) times daily.   12/13/2015 at 1800  . trimethoprim (TRIMPEX) 100 MG tablet Take 100 mg by mouth daily.   12/13/2015 at Unknown time  . acetaminophen (TYLENOL) 500 MG tablet Take 1,000 mg by mouth every 6 (six) hours as needed for mild pain or moderate pain.   week ago  . methocarbamol (ROBAXIN) 500 MG tablet Take 1 tablet (500 mg total) by mouth every 6 (six) hours as needed for muscle spasms. (Patient not taking: Reported on 12/14/2015) 120 tablet 0 Not  Taking at Unknown time  . traMADol (ULTRAM) 50 MG tablet Take 1 tablet (50 mg total) by mouth every 6 (six) hours as needed. (Patient not taking: Reported on 12/14/2015) 15 tablet 0 Not Taking at Unknown time   Scheduled:  . atorvastatin  40 mg Oral q1800  . cholecalciferol  5,000 Units Oral QHS  . clopidogrel  75 mg Oral Daily  . dexamethasone  10 mg Intravenous Q6H  . diltiazem  180 mg Oral Daily  . DULoxetine  30 mg Oral Q supper  . enoxaparin (LOVENOX) injection  40 mg Subcutaneous Q24H  . insulin aspart  0-9 Units Subcutaneous Q6H  . LORazepam  0.5 mg Oral Daily  . polyethylene glycol  17 g Oral Daily  . senna-docusate  1 tablet Oral BID  . timolol  1 drop Both Eyes BID  . vancomycin  2,000 mg Intravenous Once  . vancomycin  1,000 mg Intravenous Q8H   Infusions:   PRN: acetaminophen, guaiFENesin-dextromethorphan, HYDROcodone-acetaminophen, ipratropium-albuterol, LORazepam, morphine injection, ondansetron (ZOFRAN) IV, sodium chloride flush  Assessment: Patient is a 75 year old male admitted on 10/14 with weakness/falls/pain. Was found to have A fib w/ RVR. CHADS-VASc = 5 (HTN, DM, Hx of TIA, age > 20). Apixaban was started this hospitalization then was held due to procedures. Pharmacy was consulted to restart apixaban.   Given SCr < 1.5, age < 66yo, and weight >60kg, no dose adjustments are necessary. CBC appears stable with no notes of bleeding.   Goal of Therapy:  Anticoagulation  Monitor platelets by anticoagulation protocol: Yes   Plan:  Apixaban 5mg  PO twice daily Monitor renal function and CBC as indicated Monitor for signs/symptoms of bleeding  Stop enox 40mg   Demetrius Charity, PharmD Acute Care Pharmacy Resident  Pager: [phone] 12/22/2015

## 2015-12-22 NOTE — Progress Notes (Signed)
Patient Name: Marcus [name] Date of Encounter: 12/22/2015  Primary Cardiologist: Kate Sable (new)  Hospital Problem List     Principal Problem:   Atrial fibrillation with RVR [facility]) Active Problems:   Essential hypertension   Carotid stenosis   Diabetes ([place])   HLD (hyperlipidemia)   CVA (cerebral vascular accident) [facility])   Fall   Community acquired pneumonia of left lung   Compression fracture of vertebra ([place])   Urinary retention   New onset a-fib (H[facility])   Lung mass   Lower extremity weakness   Bacteremia   T7 vertebral fracture (HCC)   Decreased sensation   Closed fracture of seventh thoracic vertebra (HCC)   Pressure injury of skin    Subjective  75 yo admitted 10/14 with weakness.  Hx of HTN,DM, TIA and GERD eval has revealed atrial fib, pneumonia,  [name],    Feeling well this AM.  Denies chest pain or shortness of breath. Able to move his toes some.  Inpatient Medications    Scheduled Meds: . apixaban  5 mg Oral BID  . atorvastatin  40 mg Oral q1800  . cholecalciferol  5,000 Units Oral QHS  . clopidogrel  75 mg Oral Daily  . dexamethasone  10 mg Intravenous Q6H  . diltiazem  180 mg Oral Daily  . DULoxetine  30 mg Oral Q supper  . insulin aspart  0-9 Units Subcutaneous Q6H  . LORazepam  0.5 mg Oral Daily  . polyethylene glycol  17 g Oral Daily  . senna-docusate  1 tablet Oral BID  . timolol  1 drop Both Eyes BID  . vancomycin  2,000 mg Intravenous Once  . vancomycin  1,000 mg Intravenous Q8H   Continuous Infusions:   PRN Meds: acetaminophen, guaiFENesin-dextromethorphan, HYDROcodone-acetaminophen, ipratropium-albuterol, LORazepam, morphine injection, ondansetron (ZOFRAN) IV, sodium chloride flush   Vital Signs    Vitals:   12/21/15 2336 12/22/15 0500 12/22/15 0700 12/22/15 0912  BP: (!) 144/91 (!) 155/90  (!) 144/82  Pulse:  60  (!) 59  Resp:    20  Temp: 97 F (36.1 C)   97.3 F (36.3 C)  TempSrc: Oral   Oral  SpO2: 96%      Weight:   218 lb (98.9 kg)   Height:        Intake/Output Summary (Last 24 hours) at 12/22/15 1010 Last data filed at 12/21/15 1835  Gross per 24 hour  Intake              440 ml  Output              900 ml  Net             -460 ml   Filed Weights   12/20/15 0400 12/21/15 0440 12/22/15 0700  Weight: 215 lb 6.4 oz (97.7 kg) 216 lb 9.6 oz (98.2 kg) 218 lb (98.9 kg)    Physical Exam   GEN: Well nourished, well developed, in no acute distress.  HEENT: Grossly normal.  Neck: Supple, no JVD, carotid bruits, or masses. Cardiac: RRR, soft systolic murmur , rubs, or gallops. No clubbing, cyanosis.  Trace edema.  Radials/DP/PT 2+ and equal bilaterally.  Respiratory:  Respirations regular and unlabored, clear to auscultation bilaterally. GI: Soft, nontender, nondistended, BS + x 4. MS: no deformity or atrophy. Skin: warm and dry, no rash. Neuro:  Unable to move bilateral LEs.  Appears very weak  Psych: AAOx3.     Labs    CBC  Recent Labs  12/21/15 0543  WBC 7.9  HGB 11.5*  HCT 35.3*  MCV 92.4  PLT [id number]   Basic Metabolic Panel  Recent Labs  12/21/15 0543  NA 135  K 4.7  CL 102  CO2 27  GLUCOSE 138*  BUN 31*  CREATININE 0.70  CALCIUM 7.9*   Liver Function Tests No results for input(s): AST, ALT, ALKPHOS, BILITOT, PROT, ALBUMIN in the last 72 hours. No results for input(s): LIPASE, AMYLASE in the last 72 hours. Cardiac Enzymes No results for input(s): CKTOTAL, CKMB, CKMBINDEX, TROPONINI in the last 72 hours. BNP Invalid input(s): POCBNP D-Dimer No results for input(s): DDIMER in the last 72 hours. Hemoglobin A1C No results for input(s): HGBA1C in the last 72 hours. Fasting Lipid Panel No results for input(s): CHOL, HDL, LDLCALC, TRIG, CHOLHDL, LDLDIRECT in the last 72 hours. Thyroid Function Tests No results for input(s): TSH, T4TOTAL, T3FREE, THYROIDAB in the last 72 hours.  Invalid input(s): FREET3  Telemetry   -personally reviewed. Junctional rhythm  at 62.   ECG     Radiology    No results found.  Cardiac Studies   Echo 06/02/14: Study Conclusions  - Procedure narrative: Transthoracic echocardiography. Image quality was poor. The study was technically difficult, as a result of poor acoustic windows. - Left ventricle: The cavity size was normal. There was mild concentric hypertrophy. Systolic function was normal. The estimated ejection fraction was in the range of 60% to 65%. Images were inadequate for LV wall motion assessment. Doppler parameters are consistent with abnormal left ventricular relaxation (grade 1 diastolic dysfunction). - Mitral valve: Mildly calcified annulus. Normal thickness leaflets . - Left atrium: The atrium was mildly dilated. - Right atrium: The atrium was mildly dilated.  Patient Profile     Marcus [name] is  A 75M with hypertension, diabetes, TIA on clopidogrel, s/p CEA, and GERD here with several days of worsening malaise/fatigue and a recent fall who then developed Afib with RVR in the ED. He subsequently converted to NSR following uptitration of a diltiazem gtt and additional bolus dosing of metoprolol but is now back in atrial flutter.  Hospitalization also complicated by closed fracture of 7 thoracic vertebrae, R lower lobe lung mass, urinary retention and [name].    Assessment & Plan    # Atrial fibrillation with RVR:  The junctional rhythm has resolved.  Now in NSR / sinus brady  No new cardiology recs at this point  Will sign off Call for questions.   # [name]: TEE on 10/21 showed no endocarditis  .  # Carotid stenosis s/p CEA:  LDL is 30.  Continue atorvastatin.  Continue Plavix.   # Prior TIA: Resumed Plavix.  Continue atorvastatin.   Signed, Mertie Moores, MD  12/22/2015, 10:10 AM

## 2015-12-23 ENCOUNTER — Ambulatory Visit: Payer: Medicare Other

## 2015-12-23 ENCOUNTER — Telehealth: Payer: Self-pay | Admitting: *Deleted

## 2015-12-23 ENCOUNTER — Encounter (HOSPITAL_COMMUNITY): Payer: Self-pay | Admitting: Cardiology

## 2015-12-23 DIAGNOSIS — A4101 Sepsis due to Methicillin susceptible Staphylococcus aureus: Secondary | ICD-10-CM

## 2015-12-23 DIAGNOSIS — A4902 Methicillin resistant Staphylococcus aureus infection, unspecified site: Secondary | ICD-10-CM | POA: Diagnosis present

## 2015-12-23 HISTORY — DX: Sepsis due to methicillin susceptible Staphylococcus aureus: A41.01

## 2015-12-23 LAB — GLUCOSE, CAPILLARY
GLUCOSE-CAPILLARY: 122 mg/dL — AB (ref 65–99)
Glucose-Capillary: 113 mg/dL — ABNORMAL HIGH (ref 65–99)
Glucose-Capillary: 127 mg/dL — ABNORMAL HIGH (ref 65–99)
Glucose-Capillary: 138 mg/dL — ABNORMAL HIGH (ref 65–99)
Glucose-Capillary: 152 mg/dL — ABNORMAL HIGH (ref 65–99)

## 2015-12-23 LAB — AEROBIC/ANAEROBIC CULTURE (SURGICAL/DEEP WOUND)

## 2015-12-23 LAB — CREATININE, SERUM
Creatinine, Ser: 0.64 mg/dL (ref 0.61–1.24)
GFR calc Af Amer: >60 mL/min
GFR calc non Af Amer: >60 mL/min

## 2015-12-23 LAB — AEROBIC/ANAEROBIC CULTURE W GRAM STAIN (SURGICAL/DEEP WOUND)

## 2015-12-23 NOTE — Progress Notes (Addendum)
[facility] for Infectious Disease    Date of Admission:  12/14/2015   Total days of antibiotics 10        Day 2 vancomycin          ID: Marcus [name] is a 75 y.o. male with comlicated [name] with lung mass and thoracic lytic lesion, unclear if also has pulmonary abscess and thoracic osteo since malignancy also in the differential Principal Problem:   Atrial fibrillation with RVR ([place]) Active Problems:   Essential hypertension   Diabetes ([place])   HLD (hyperlipidemia)   Fall   Community acquired pneumonia of left lung   Compression fracture of vertebra (H[facility])   Urinary retention   New onset a-fib (HCC)   Right lower lobe lung mass   Lower extremity weakness   T7 vertebral fracture (H[facility])   Decreased sensation   Closed fracture of seventh thoracic vertebra ([facility])   Pressure injury of skin   MRSA infection   MSSA (methicillin susceptible Staphylococcus aureus) septicemia (H[facility])    Subjective: He remains afebrile,   Medications:  . apixaban  5 mg Oral BID  . atorvastatin  40 mg Oral q1800  . cholecalciferol  5,000 Units Oral QHS  . clopidogrel  75 mg Oral Daily  . dexamethasone  10 mg Intravenous Q6H  . diltiazem  180 mg Oral Daily  . DULoxetine  30 mg Oral Q supper  . insulin aspart  0-9 Units Subcutaneous Q6H  . LORazepam  0.5 mg Oral Daily  . polyethylene glycol  17 g Oral Daily  . senna-docusate  1 tablet Oral BID  . timolol  1 drop Both Eyes BID  . vancomycin  1,250 mg Intravenous Q12H    Objective: Vital signs in last 24 hours: Temp:  [97.5 F (36.4 C)-98.1 F (36.7 C)] 98.1 F (36.7 C) (10/23 1257) Pulse Rate:  [56-65] 65 (10/23 1257) Resp:  [14-19] 17 (10/23 0350) BP: (134-169)/(73-87) 169/87 (10/23 1119) SpO2:  [95 %-99 %] 99 % (10/23 1257) Weight:  [217 lb 12.8 oz (98.8 kg)] 217 lb 12.8 oz (98.8 kg) (10/23 0350) Physical Exam  Constitutional: He is oriented to person, place.He appears well-developed and well-nourished. No distress.    HENT:  Mouth/Throat: Oropharynx is clear and moist. No oropharyngeal exudate.  Cardiovascular: Normal rate, regular rhythm and normal heart sounds. Exam reveals no gallop and no friction rub.  No murmur heard.  Pulmonary/Chest: Effort normal and breath sounds normal. No respiratory distress. He has no wheezes.  Abdominal: Soft. Bowel sounds are normal. He exhibits no distension. There is no tenderness.  Neurological: He is alert and oriented to person, place. 0/5 strength of lower extremities bilaterally. upgong toes Skin: Skin is warm and dry. No rash noted. No erythema.  Psychiatric: He has a normal mood and affect. His behavior is normal.    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 Malignancy = He will require 8 wk of Iv abtx. He is currently on day 10 of 56 days.   He had repeat aspirate culture and pulmonary mass aspirate that showed MRSA in sputum cx vs. MSSA in tissue aspirate. He had been on cefazolin at the time of the lung tissue aspirate that could explain decrease bioburden though the imaging suggested worsening process.  I have suspect that the has disseminated MSSA (found in blood, urine, pulm tissue) but there could be a possibility that MRSA is also involvedin in pulm process  - recommend to finish out a 2 wk course  with vancomycin, then complete the remaining course with cefazolin to finish a total of 8 wks.    [facility] for Infectious Diseases Cell: [phone] Pager: [phone]  12/23/2015, 1:13 PM

## 2015-12-23 NOTE — Consult Note (Signed)
[facility] Nurse wound follow-up consult note Sacrum and bilat buttocks re-assessment.  Family at bedside to assess wound appearance  Wound type: Inner gluteal fold with deep tissue injury which has a blood-filled blister which ruptured when assessed; revealing moist dark purple wound bed with mod amt dark brown drainage, no odor. 2X1X.1cm; 80% red, 20% dark purple, loose peeling skin surrounding, small amt pink drainage.  Bilat buttocks surrounding the gluteal fold with previous dark purple-reddish deep tissue injury areas have evolved at this time into multiple patchy areas of stage 2 pressure injuries; red and moist, small amt pink drainage; affected area is 10X10cm.  Pressure Ulcer POA: Yes Dressing procedure/placement/frequency: Discussed with patient and family members that deep tissue injuries may evolve into full thickness wounds despite optimal plan of care., and importance of turning off the affected area as much as possible.  Foam dressing to protect and absorb drainage.  Air mattress in place reduce pressure to the locations, and pressure redistribution cushion at the bedside when OOB to chair.  Pt should limit time in chair with therapy to an hour or less to reduce prolonged pressure over the affected areas. Discussed plan of care with family and they verbalize understanding. Please re-consult if further assistance is needed.  Thank-you,  Julien Girt MSN, [place], [place], [place], [place]

## 2015-12-23 NOTE — Progress Notes (Signed)
Occupational Therapy Treatment Patient Details Name: Marcus [name] MRN: [id number] DOB: 08-26-1940 Today's Date: 12/23/2015    History of present illness 75 y.o. male admitted to [facility] on 12/14/15 for LE weakness after fall, pain.  Initial eval revealed CAP, L lower lobe mass (bx on 12/18/15), and closed fx of T7 vertebrae (resultant weakness and loss of sensation from T7 down), urinary retention, A-fib with RVR.  Neurosurgeon consulted and pt not thought to be a surgical candidate.  Pt with significant PMhx of urethral stricture, DM, TIA, stroke, low back pain, essential tremor, anxiety, and spinal fusion surgery.   OT comments  Pt limited to bed level exercises with theraband today due to increased pain.  Pt very irritable and upset with family this pm.  Support provided with pt eventually agreeing to work with OT.  He deferred EOB activity due to pain.  Continue to recommend CIR.   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       Mobility Bed Mobility               General bed mobility comments: Pt with c/o severe pain and adamantly refusing   Transfers                 General transfer comment: Pt with c/o sever pain and adamantly refusing     Balance                                   ADL                                                Vision                     Perception     Praxis      Cognition   Behavior During Therapy: Anxious (irritable ) Overall Cognitive Status: Impaired/Different from baseline Area of Impairment: Orientation;Attention;Memory;Awareness;Problem solving Orientation Level: Disoriented to;Time Current Attention Level: Sustained;Selective Memory: Decreased short-term memory      Awareness:  Intellectual Problem Solving: Difficulty sequencing;Requires verbal cues;Requires tactile cues General Comments: Pt with variable orientation.  He will answer incorrectly at times,but then able to back track and correct himself.  He was somewhat argumentative with his wife and daughter.  The more he argued with them, the more disconnected his conversation became - RN notified     Extremity/Trunk Assessment               Exercises General Exercises - Upper Extremity Shoulder Flexion: Strengthening;Right;Left;5 reps;Supine;Theraband Theraband Level (Shoulder Flexion): Level 3 (Green) Elbow Flexion: Strengthening;Right;Left;10 reps;Supine;Theraband Theraband Level (Elbow Flexion): Level 3 (Green) Other Exercises Other Exercises: Pt performed 5 reps internal and external rotation with green theraband x 5 reps.  he required several rest breaks between exercises    Shoulder Instructions       General Comments      Pertinent Vitals/ Pain       Pain Assessment: Faces Faces Pain Scale: Hurts whole lot Pain Location: Rt chest that wraps Lt to Rt  Pain Descriptors / Indicators: Grimacing;Guarding;Aching;Squeezing Pain Intervention(s): Monitored during session;Limited  activity within patient's tolerance  Home Living                                          Prior Functioning/Environment              Frequency  Min 2X/week        Progress Toward Goals  OT Goals(current goals can now be found in the care plan section)  Progress towards OT goals: Progressing toward goals  ADL Goals Pt Will Perform Grooming: with min assist;sitting Pt Will Perform Upper Body Bathing: with min assist;sitting Pt Will Perform Lower Body Bathing: with mod assist Additional ADL Goal #1: Pt will engage in 10 minutes of functional tasks with 2 rest breaks or less. Additional ADL Goal #2: Pt will perform bed rolling L <> R for hygiene with assist of 1 person.   Plan Discharge  plan remains appropriate    Co-evaluation                 End of Session     Activity Tolerance Patient limited by pain   Patient Left in bed;with call bell/phone within reach;with family/visitor present   Nurse Communication          Time: [id number] OT Time Calculation (min): 42 min  Charges: OT General Charges $OT Visit: 1 Procedure OT Treatments $Self Care/Home Management : 8-22 mins $Therapeutic Activity: 8-22 mins $Therapeutic Exercise: 8-22 mins  [name] M 12/23/2015, 5:10 PM

## 2015-12-23 NOTE — H&P (Signed)
Physical Medicine and Rehabilitation Admission H&P    Chief Complaint  Patient presents with  . Failure To Thrive  . Weakness  : HPI: Marcus [name] is a 75 y.o. right handed male with history of urethral stricture, TIA on Plavix, carotid stenosis status post CEA, hypertension, diabetes mellitus, tremor.Per chart review, wife, and daughter patient lives with spouse. One level home. Wife works during the day. Patient works full-time as a used Barrister's clerk.  He presented 12/14/2015 with generalized weakness failure to thrive after recent fall.Patient found to be in atrial fibrillation with RVR.  Initial chest x-ray evaluation revealed community-acquired pneumonia as well as findings of right lower lobe mass, closed fracture of T7 vertebrae. CT of the chest showed rounded masslike consolidation in the medial aspect of the superior segment right lower lobe measuring 4.7 x 4.9 cm new from 07/07/2013. MRI thoracic spine showed abnormal segment and contrast enhancement of T6 and T7 vertebral bodies with associated T7 compression fracturewith associated discitis -osteomyelitis  most likely secondary to direct extension of the process within the right lower lobe. Cranial CT scan negative for acute changes. Follow-up neurosurgery Dr. Annette Stable with review of MRI thoracic spine no plan for surgical intervention at this time. CT-guided biopsy performed 12/18/2015 with results suggesting infection. Radiation oncology consulted in regards to findings of mass plan to begin simulation and radiation treatment.Infectious disease consulted for [name] with osteomyelitis and advised intravenous Vancomycin through 01/05/2016 then began cefazolin 2 g intravenous every 8 hours after vancomycin completed for a total treatment course of 6 weeks to be completed 01/29/2016. However sputum cultures later showed MRSA with cefazolin discontinued and initiation of vancomycin . Echocardiogram without any vegetation. TEE on  12/21/2015 showed no endocarditis..  Cardiology continues to follow for atrial fibrillation with RVR status post conversion normal sinus rhythm after being maintained on Cardizem drip transitioned to by mouth and the addition of Eliquis for anticoagulation in addition to Plavix. Physical and occupational therapy evaluations completed. Patient was admitted for comprehensive rehabilitation program.  ROS Constitutional: Positive for malaise/fatigue. Negative for chills and fever.  HENT: Negative for hearing loss.   Eyes: Negative for blurred vision and double vision.  Respiratory: Positive for cough. Negative for shortness of breath.   Cardiovascular: Negative for chest pain, palpitations and leg swelling.  Gastrointestinal: Positive for constipation. Negative for nausea and vomiting.  Genitourinary: Positive for frequency and urgency.  Musculoskeletal: Positive for back pain and myalgias.  Skin: Negative for rash.  Neurological: Positive for weakness, confusion. Negative for seizures and headaches.  All other systems reviewed and are negative   Past Medical History:  Diagnosis Date  . Anxiety state, unspecified   . Calculus of kidney   . 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 Complication of anesthesia    " I woke up in the recovery room with tube in throat and panicked."  . 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 Herpes zoster without mention of complication   . History of hiatal hernia   . Hypertrophy of prostate with urinary obstruction and other lower urinary tract symptoms (LUTS)   . Insomnia, unspecified   . Lumbago   . Other and unspecified hyperlipidemia   . Pneumonia    bronchial  . Reflux esophagitis   . Seasonal allergies   . Stroke ([place])   . TIA (transient ischemic attack)   . Type II or unspecified type diabetes mellitus without mention of complication, not stated as uncontrolled   .  Unspecified essential hypertension   . Unspecified vitamin D deficiency   .  Urethral stricture unspecified   . Urinary frequency    Past Surgical History:  Procedure Laterality Date  . CARDIAC CATHETERIZATION  1986   normal, Dr Lia Foyer  . ENDARTERECTOMY Left 06/08/2014   Procedure: LEFT CAROTID ARTERY ENDARTERECTOMY;  Surgeon: Rosetta Posner, MD;  Location: [facility];  Service: Vascular;  Laterality: Left;  . EYE SURGERY  2014   cataract  . EYE SURGERY  2013   Retina  . PATCH ANGIOPLASTY Left 06/08/2014   Procedure: WITH DACRON PATCH ANGIOPLASTY;  Surgeon: Rosetta Posner, MD;  Location: [facility];  Service: Vascular;  Laterality: Left;  . SPINAL FUSION  12/91,11/91   obtained bone from left lower leg  . stimulator 1991     spine  . stress thallium  1994    normal;  . stretching bladder  07/2007   Dr Risa Grill   Family History  Problem Relation Age of Onset  . Diabetes Mother   . Cancer Mother   . Hypertension Father   . Stroke Father   . Alcohol abuse Brother    Social History:  reports that he quit smoking about 24 years ago. His smoking use included Cigarettes. He has never used smokeless tobacco. He reports that he does not drink alcohol or use drugs. Allergies:  Allergies  Allergen Reactions  . Codeine Other (See Comments)    constipation  . Demerol [Meperidine] Other (See Comments)    Drops blood pressure   . Contrast Media [Iodinated Diagnostic Agents] Nausea And Vomiting  . Gabapentin Other (See Comments)    Suicidal, depression  . Other Other (See Comments)    All narcotics make pt hallucinate   Medications Prior to Admission  Medication Sig Dispense Refill  . atorvastatin (LIPITOR) 40 MG tablet TAKE ONE TABLET BY MOUTH ONCE DAILY FOR CHOLESTEROL (Patient taking differently: TAKE ONE TABLET BY MOUTH ONCE DAILY AT BEDTIME FOR CHOLESTEROL) 30 tablet 3  . Cholecalciferol (VITAMIN D3) 5000 units TABS Take 5,000 Units by mouth at bedtime.    . clopidogrel (PLAVIX) 75 MG tablet TAKE 1 TABLET BY MOUTH DAILY 90 tablet 3  . 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 HYDROcodone-acetaminophen (NORCO/VICODIN) 5-325 MG tablet Take 0.5-1 tablets by mouth every 6 (six) hours as needed for moderate pain.     Marland Kitchen LORazepam (ATIVAN) 0.5 MG tablet Take 0.5 mg by mouth See admin instructions. Take 1 tablet (0.5 mg) by mouth every morning, may also take 1 tablet 2 more times during the day as needed for anxiety  0  . nicotine polacrilex (COMMIT) 2 MG lozenge Take 2 mg by mouth every 2 (two) hours.     . ondansetron (ZOFRAN) 4 MG tablet Take 1 tablet (4 mg total) by mouth every 8 (eight) hours as needed for nausea or vomiting. 20 tablet 0  . timolol (BETIMOL) 0.5 % ophthalmic solution Place 1 drop into both eyes 2 (two) times daily.    Marland Kitchen trimethoprim (TRIMPEX) 100 MG tablet Take 100 mg by mouth daily.    Marland Kitchen acetaminophen (TYLENOL) 500 MG tablet Take 1,000 mg by mouth every 6 (six) hours as needed for mild pain or moderate pain.    . methocarbamol (ROBAXIN) 500  MG tablet Take 1 tablet (500 mg total) by mouth every 6 (six) hours as needed for muscle spasms. (Patient not taking: Reported on 12/14/2015) 120 tablet 0  . traMADol (ULTRAM) 50 MG tablet Take 1 tablet (50 mg total) by mouth every 6 (six) hours as needed. (Patient not taking: Reported on 12/14/2015) 15 tablet 0    Home: [place] expects to be discharged to:: Private residence Living Arrangements: Spouse/significant other Available Help at Discharge: Family, Available 24 hours/day Type of Home: House Home Access: Level entry Home Layout: One level Bathroom Shower/Tub: Tub/shower unit, Architectural technologist: Handicapped height Home Equipment: Environmental consultant - 2 wheels   Functional History: Prior Function Level of Independence: Independent Comments: works full time as a used Environmental manager Status:  Mobility: Bed Mobility Overal bed mobility: +2 for physical assistance, Needs Assistance Bed Mobility: Sidelying to Sit Rolling: +2 for physical assistance, Max assist Sidelying to sit: +2 for physical assistance, Max assist, HOB elevated Sit to sidelying: +2 for physical assistance, Max assist General bed mobility comments: Two person max assist, I feel pt was using his arms more today than yesterday to help with transitions and we left HOB a bit higher as well.  Transfers Overall transfer level: Needs assistance Equipment used:  (slide board) Transfers: Lateral/Scoot Transfers  Lateral/Scoot Transfers: With slide board, From elevated surface, +2 physical assistance, Max assist General transfer comment: Two person max assist to slide board transfer into drop arm recliner chair.  Verbal cues to pt for hand placement, one therapist in front one behind helping to control movement of body over slide baord.  Pt was helping some with his arms (right hand reaching and pulling and left arm holding onto front therapist).       ADL: ADL Overall ADL's : Needs assistance/impaired Lower Body Dressing: Total assistance General ADL Comments: Pt sat EOB for about 4 minutes with +1 mod A to periods of min guard using BUE positioned on bed to provide support.    Cognition: Cognition Overall Cognitive Status: Impaired/Different from baseline Orientation Level: Oriented to person, Oriented to place, Disoriented to time, Disoriented to situation Cognition Arousal/Alertness: Awake/alert Behavior During Therapy: WFL for tasks assessed/performed Overall Cognitive Status: Impaired/Different from baseline Area of Impairment: Memory Orientation Level: Time Memory: Decreased short-term memory General Comments: Pt did not remember sitting up with me yesterday.   Physical Exam: Blood pressure 136/75, pulse (!) 56, temperature 97.8 F (36.6 C), temperature source Oral, resp.  rate 17, height 5' 7.5" (1.715 m), weight 98.8 kg (217 lb 12.8 oz), SpO2 97 %. Physical Exam Gen: NAD. Vital signs reviewed. HENT:  Head: Normocephalic. Atraumatic.  Eyes: EOM are normal. No discharge.  Neck: Normal range of motion. Neck supple. No thyromegaly present.  Cardiovascular: Normal rate and regular rhythm.   Respiratory: Effort normal and breath sounds normal. No respiratory distress.  GI: Soft. Bowel sounds are normal. He exhibits no distension.  Neurological: He is alert and oriented x2.  He does show some mild delay in processing.  Follows simple commands.  T6 sensory level. , has minimal LT below level.  Toes up bilaterally.  DTR's 3+ patella/achilles bilaterally.  Motor: B/l UE strength 4+/5.  B/l LE 0/5. Skin: Sacral ulcer not examined Psychiatric: He has a normal mood and affect. His behavior is normal   Results for orders placed or performed during the hospital encounter of 12/14/15 (from the past 48 hour(s))  Glucose, capillary     Status: Abnormal  Collection Time: 12/21/15  1:04 PM  Result Value Ref Range   Glucose-Capillary 128 (H) 65 - 99 mg/dL  Glucose, capillary     Status: Abnormal   Collection Time: 12/21/15  5:00 PM  Result Value Ref Range   Glucose-Capillary 118 (H) 65 - 99 mg/dL  Glucose, capillary     Status: Abnormal   Collection Time: 12/21/15 11:16 PM  Result Value Ref Range   Glucose-Capillary 160 (H) 65 - 99 mg/dL  Glucose, capillary     Status: Abnormal   Collection Time: 12/22/15  4:32 AM  Result Value Ref Range   Glucose-Capillary 120 (H) 65 - 99 mg/dL  Glucose, capillary     Status: Abnormal   Collection Time: 12/22/15 11:31 AM  Result Value Ref Range   Glucose-Capillary 113 (H) 65 - 99 mg/dL  Glucose, capillary     Status: Abnormal   Collection Time: 12/22/15  5:01 PM  Result Value Ref Range   Glucose-Capillary 130 (H) 65 - 99 mg/dL  Glucose, capillary     Status: Abnormal   Collection Time: 12/23/15 12:06 AM  Result Value  Ref Range   Glucose-Capillary 127 (H) 65 - 99 mg/dL  Glucose, capillary     Status: Abnormal   Collection Time: 12/23/15  5:21 AM  Result Value Ref Range   Glucose-Capillary 138 (H) 65 - 99 mg/dL  Creatinine, serum     Status: None   Collection Time: 12/23/15  7:38 AM  Result Value Ref Range   Creatinine, Ser 0.64 0.61 - 1.24 mg/dL   GFR calc non Af Amer >60 >60 mL/min   GFR calc Af Amer >60 >60 mL/min    Comment: (NOTE) The eGFR has been calculated using the CKD EPI equation. This calculation has not been validated in all clinical situations. eGFR's persistently <60 mL/min signify possible Chronic Kidney Disease.    No results found.     Medical Problem List and Plan: 1.  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  2.  DVT Prophylaxis/Anticoagulation: Eliquis 3. Pain Management: Hydrocodone as needed 4. Mood: Ativan 0.5 mg daily and prn 5. Neuropsych: This patient is capable of making decisions on his own behalf. 6. Skin/Wound Care: Follow-up WOC for sacrum and bilateral buttocks ulcers 7. Fluids/Electrolytes/Nutrition: Routine skin checks 8.ID/MSSA/MRSA. Presently on vancomycin through 01/05/2016 and then begin cefazolin 2 g intravenous every 8 hours after vancomycin completed for a total course of 6 weeks to be completed 01/29/2016 9. Neurogenic bowel and bladder. Establish bowel program. Provide full education 10. Atrial fibrillation with RVR. Continue Cardizem 180 mg daily. Cardiac rate controlled. Follow-up cardiology services 11. Hyperlipidemia. Lipitor 12. Diabetes mellitus type 2. Diet controlled. Monitor while on Decadron therapy.   Post Admission Physician Evaluation: 1. Functional deficits secondary  to infection causing cord compression/myelopathy. 2. Patient is admitted to receive collaborative, interdisciplinary care between the physiatrist, rehab nursing staff, and therapy team. 3. Patient's level of medical complexity  and substantial therapy needs in context of that medical necessity cannot be provided at a lesser intensity of care such as a SNF. 4. Patient has experienced substantial functional loss from his/her baseline which was documented above under the "Functional History" and "Functional Status" headings.  Judging by the patient's diagnosis, physical exam, and functional history, the patient has potential for functional progress which will result in measurable gains while on inpatient rehab.  These gains will be of substantial and practical use upon discharge  in facilitating mobility and self-care at  the household level. 5. Physiatrist will provide 24 hour management of medical needs as well as oversight of the therapy plan/treatment and provide guidance as appropriate regarding the interaction of the two. 6. 24 hour rehab nursing will assist with bladder management, bowel management, safety, skin/wound care, disease management, medication administration, pain management and patient education and help integrate therapy concepts, techniques,education, etc. 7. PT will assess and treat for/with: Lower extremity strength, range of motion, stamina, balance, functional mobility, safety, adaptive techniques and equipment, woundcare, coping skills, pain control, SCI education.   Goals are: Mod/Min A. 8. OT will assess and treat for/with: ADL's, functional mobility, safety, upper extremity strength, adaptive techniques and equipment, wound mgt, ego support, and community reintegration.   Goals are: Mod/Min A. Therapy may not proceed with showering this patient. 9. Case Management and Social Worker will assess and treat for psychological issues and discharge planning. 10. Team conference will be held weekly to assess progress toward goals and to determine barriers to discharge. 11. Patient will receive at least 3 hours of therapy per day at least 5 days per week. 12. ELOS: 16-19 days.       13. Prognosis:  good and  fair  Delice Lesch, MD, Mellody Drown 12/23/2015

## 2015-12-23 NOTE — Progress Notes (Signed)
Rehab admissions - I have approval from insurance carrier for acute inpatient rehab admission once patient deemed medically ready for rehab.  Waiting on pathology results.  I will check on patient again tomorrow.  Call me for questions. [phone]

## 2015-12-23 NOTE — Clinical Social Work Note (Signed)
Clinical Social Worker continuing to follow patient and family for support.  Patient has approval for inpatient rehab once medically stable.  CSW remains available for support and to assist with discharge if needed.  Barbette Or, [place]

## 2015-12-23 NOTE — Telephone Encounter (Signed)
called pathology spoke with Tammy,  Asked about results of bx done 12/18/15 RRL post medial mass, per Tammy, Dr. Al Corpus needs to put a signature on it, Dr. Al Corpus will be in the office tomorrow and placed a stat request in am, gave our number  [phone] and to notify our PA Shona Simpson or MD, Dr.Manning know when results of bx is done,thanked Tammy,notified Joaquim Lai, RN

## 2015-12-23 NOTE — Progress Notes (Signed)
PT Cancellation Note  Patient Details Name: Marcus [name] MRN: [id number] DOB: October 17, 1940   Cancelled Treatment:    Reason Eval/Treat Not Completed: Medical issues which prohibited therapy.  Pt is in bed on air mattress with family and discussed his skin change on sacral area related to fall and his heparin pooling blood there.  He will be held today due to protecting skin and will check tomorrow and see if he is able to get onto sliding board and may also be in CIR by then.   Ramond Dial 12/23/2015, 9:07 AM    Mee Hives, PT MS Acute Rehab Dept. Number: [place] and [place]

## 2015-12-23 NOTE — Care Management Important Message (Signed)
Important Message  Patient Details  Name: Marcus [name] MRN: [id number] Date of Birth: Jul 14, 1940   Medicare Important Message Given:  Yes    Bethena Roys, RN 12/23/2015, 10:32 AM

## 2015-12-23 NOTE — Progress Notes (Signed)
Progress Note    Marcus [name]  [id number] DOB: 29-May-1940  DOA: 12/14/2015 PCP: Jeanmarie Hubert, MD    Brief Narrative:   Chief complaint: F/U RLL lung mass/[name]/lower extremity weakness  Marcus [name] is an 75 y.o. male with PMH of hypertension, diabetes, TIA and GERD as well as recent UTI who was admitted 12/14/15 with chief complaint of new onset lower extremity weakness. In the ED, he developed atrial fibrillation with RVR and was placed on a Cardizem drip and given Lopressor. Urology was consulted for urinary retention and placed a Foley catheter. Broad-spectrum antibiotics were initiated. Imaging showed a closed fracture of T7 and a right lower lobe lung mass raising the question of whether he had a malignant process versus severe infection. CT-guided biopsy done 12/18/15. Radiation simulation done 12/17/15 but treatments have not been initiated pending pathology results. ID following for [name].  Assessment/Plan:   Principal Problems:   Lower extremity weakness/fall secondary to T7 compression fracture and direct extension of right lower lobe mass which could represent infection/CAP or malignancy, rule out discitis/osteomyelitis/[name]/decreased sensation Diagnostic uncertainty remains, but preliminary biopsy results so far do not show any evidence of malignancy. Blood cultures done on admission positive for MSSA, with surveillance blood cultures negative so far. The patient underwent CT-guided core biopsy of right lower lobe/posterior mediastinal mass lesion with pathology report pending at this time. Antibiotics switched to vancomycin 12/22/15 after sputum cultures grew MRSA. The patient will need 6 weeks of vancomycin. Surveillance cultures negative, PICC line Placed 12/21/15. TEE done 12/20/15, negative for endocarditis. CIR consulted for consideration of inpatient rehabilitation. Continue Decadron for now.    Atrial fibrillation with RVR  ([place]) Patient was placed on a Cardizem drip on admission which was discontinued secondary to low blood pressure. Currently on digoxin and oral Cardizem. Was in a junctional rhythm 12/21/15, so cardiologist discontinued digoxin. Eliquis resumed 12/22/15.  Active Problems:   Essential hypertension Does not appear to be on antihypertensives at home, and IV Cardizem dropped his blood pressure. Blood pressure currently stable on oral Cardizem, 180 mg daily.    Carotid stenosis History of CEA.    Diabetes ([place]) Hemoglobin A1c 5.8% on 09/16/15. Mildly hyperglycemic here, likely secondary to Decadron. CBGs 113-138, continue insulin sensitive SSI.    HLD (hyperlipidemia) Continue Lipitor.    CVA (cerebral vascular accident) ([place]) Continue Plavix.    Urinary retention Foley catheter placed by urology.    Pressure ulcer to the sacrum Evaluated by wound care nurse. Foam dressings ordered. Pressure reduction mattress ordered.   Family Communication/Anticipated D/C date and plan/Code Status   DVT prophylaxis: Anticoagulated with Eliquis. Code Status: Full Code.  Family Communication: Wife/daughter at the bedside. Disposition Plan: CIR if bed available today.   Medical Consultants:    Cardiology  Radiation Oncology  Neurosurgery  Infectious Disease  Urology   Procedures:    Venous Dopplers 12/19/15: Negative.  2-D echo 12/17/15: EF 60-65 percent. Grade 2 diastolic dysfunction.  TEE 12/20/15: Negative for endocarditis.  Anti-Infectives:    Azithromycin 12/14/15 ---> 12/15/15  Rocephin 12/14/15 ---> 12/15/15  Cefazolin 12/15/15--->12/22/15  Vancomycin 12/22/15--->   Subjective:   The patient is less confused today, family at the bedside. Says he has moderate back pain. ROS is negative for nausea/vomiting, shortness of breath, chest pain, and positive for back pain. Appetite "comes and goes".  Has hiccups again today.  Objective:    Vitals:   12/22/15 1824  12/22/15 2012 12/23/15 0009 12/23/15  0350  BP: (!) 145/77 139/84 134/77 135/73  Pulse: 61 60 60 62  Resp: 19 17 14 17   Temp: 97.6 F (36.4 C) 98 F (36.7 C) 97.5 F (36.4 C) 97.8 F (36.6 C)  TempSrc: Oral Oral Oral Oral  SpO2:  95% 97% 96%  Weight:    98.8 kg (217 lb 12.8 oz)  Height:        Intake/Output Summary (Last 24 hours) at 12/23/15 0726 Last data filed at 12/23/15 0643  Gross per 24 hour  Intake             1050 ml  Output             1950 ml  Net             -900 ml   Filed Weights   12/21/15 0440 12/22/15 0700 12/23/15 0350  Weight: 98.2 kg (216 lb 9.6 oz) 98.9 kg (218 lb) 98.8 kg (217 lb 12.8 oz)    Exam: General exam: Appears calm and comfortable. Hiccupping.  Respiratory system: Diminished but clear. Respiratory effort normal. Cardiovascular system: S1 & S2 heard, RRR. No JVD,  rubs, gallops or clicks. No murmurs. Gastrointestinal system: Abdomen is nondistended, soft and nontender. No organomegaly or masses felt. Normal bowel sounds heard. Central nervous system: Alert, disoriented. Lower extremity paralysis. Extremities: No clubbing,  or cyanosis. 1+ edema. Skin: Deep tissue injury to sacrum otherwise no rashes, lesions or ulcers. Psychiatry: Judgement and insight appear diminished. Mood & affect appropriate.   Data Reviewed:   I have personally reviewed following labs and imaging studies:  Labs: Basic Metabolic Panel:  Recent Labs Lab 12/17/15 0337 12/18/15 0429 12/19/15 0356 12/21/15 0543  NA 134* 134* 139 135  K 4.2 4.1 4.4 4.7  CL 102 101 107 102  CO2 24 25 26 27   GLUCOSE 193* 148* 145* 138*  BUN 27* 27* 33* 31*  CREATININE 0.80 0.84 0.77 0.70  CALCIUM 8.4* 8.1* 8.2* 7.9*  MG 2.2 2.2 2.3  --   PHOS 3.0 3.3  --   --    GFR Estimated Creatinine Clearance: 94.3 mL/min (by C-G formula based on SCr of 0.7 mg/dL). Liver Function Tests:  Recent Labs Lab 12/17/15 0337 12/18/15 0429 12/19/15 0356  AST 116* 61* 69*  ALT 50 33 37   ALKPHOS 138* 116 114  BILITOT 0.3 0.2* 0.3  PROT 5.9* 5.7* 5.4*  ALBUMIN 1.4* 1.5* 1.5*   Coagulation profile No results for input(s): INR, PROTIME in the last 168 hours.  CBC:  Recent Labs Lab 12/17/15 0337 12/18/15 0429 12/19/15 0356 12/21/15 0543  WBC 6.5 9.2 7.6 7.9  NEUTROABS 5.9 8.4* 7.0  --   HGB 11.4* 10.3* 10.4* 11.5*  HCT 35.4* 32.0* 32.5* 35.3*  MCV 93.4 92.5 92.6 92.4  PLT 321 376 359 400   CBG:  Recent Labs Lab 12/22/15 0432 12/22/15 1131 12/22/15 1701 12/23/15 0006 12/23/15 0521  GLUCAP 120* 113* 130* 127* 138*   Sepsis Labs:  Recent Labs Lab 12/17/15 0337 12/18/15 0429 12/19/15 0356 12/21/15 0543  WBC 6.5 9.2 7.6 7.9  LATICACIDVEN 1.2  --   --   --     Microbiology Recent Results (from the past 240 hour(s))  Blood culture (routine x 2)     Status: Abnormal   Collection Time: 12/14/15  8:50 AM  Result Value Ref Range Status   Specimen Description BLOOD LEFT ARM  Final   Special Requests BOTTLES DRAWN AEROBIC AND ANAEROBIC 10CC  Final  Culture  Setup Time   Final    IN BOTH AEROBIC AND ANAEROBIC BOTTLES GRAM POSITIVE COCCI IN CLUSTERS CRITICAL RESULT CALLED TO, READ BACK BY AND VERIFIED WITH: TO TO SGULLETT(PHARD) BY MCAMPBELL 12/15/2015 AT 5:55AM    Culture (A)  Final    STAPHYLOCOCCUS AUREUS SUSCEPTIBILITIES PERFORMED ON PREVIOUS CULTURE WITHIN THE LAST 5 DAYS.    Report Status 12/17/2015 FINAL  Final  Blood culture (routine x 2)     Status: Abnormal   Collection Time: 12/14/15  9:00 AM  Result Value Ref Range Status   Specimen Description BLOOD RIGHT HAND  Final   Special Requests BOTTLES DRAWN AEROBIC AND ANAEROBIC 5CC  Final   Culture  Setup Time   Final    IN BOTH AEROBIC AND ANAEROBIC BOTTLES GRAM POSITIVE COCCI IN CLUSTERS Organism ID to follow CRITICAL RESULT CALLED TO, READ BACK BY AND VERIFIED WITH: TO TO SGULLETT(PHARD) BY MCAMPBELL 12/15/2015 AT 5:55AM    Culture STAPHYLOCOCCUS AUREUS (A)  Final   Report Status  12/17/2015 FINAL  Final   Organism ID, Bacteria STAPHYLOCOCCUS AUREUS  Final      Susceptibility   Staphylococcus aureus - MIC*    CIPROFLOXACIN <=0.5 SENSITIVE Sensitive     ERYTHROMYCIN <=0.25 SENSITIVE Sensitive     GENTAMICIN <=0.5 SENSITIVE Sensitive     OXACILLIN <=0.25 SENSITIVE Sensitive     TETRACYCLINE <=1 SENSITIVE Sensitive     VANCOMYCIN <=0.5 SENSITIVE Sensitive     TRIMETH/SULFA <=10 SENSITIVE Sensitive     CLINDAMYCIN <=0.25 SENSITIVE Sensitive     RIFAMPIN <=0.5 SENSITIVE Sensitive     Inducible Clindamycin NEGATIVE Sensitive     * STAPHYLOCOCCUS AUREUS  Blood Culture ID Panel (Reflexed)     Status: Abnormal   Collection Time: 12/14/15  9:00 AM  Result Value Ref Range Status   Enterococcus species NOT DETECTED NOT DETECTED Final   Vancomycin resistance NOT DETECTED NOT DETECTED Final   Listeria monocytogenes NOT DETECTED NOT DETECTED Final   Staphylococcus species DETECTED (A) NOT DETECTED Final    Comment: CRITICAL RESULT CALLED TO, READ BACK BY AND VERIFIED WITH: TO SGULLETT(PHARD) BY MCAMPBELL 12/15/2015 AT 5:55AM    Staphylococcus aureus DETECTED (A) NOT DETECTED Final    Comment: CRITICAL RESULT CALLED TO, READ BACK BY AND VERIFIED WITH: TO SGULLETT(PHARD) BY MCAMPBELL 12/15/2015 AT 5:55AM    Methicillin resistance NOT DETECTED NOT DETECTED Final   Streptococcus species NOT DETECTED NOT DETECTED Final   Streptococcus agalactiae NOT DETECTED NOT DETECTED Final   Streptococcus pneumoniae NOT DETECTED NOT DETECTED Final   Streptococcus pyogenes NOT DETECTED NOT DETECTED Final   Acinetobacter baumannii NOT DETECTED NOT DETECTED Final   Enterobacteriaceae species NOT DETECTED NOT DETECTED Final   Enterobacter cloacae complex NOT DETECTED NOT DETECTED Final   Escherichia coli NOT DETECTED NOT DETECTED Final   Klebsiella oxytoca NOT DETECTED NOT DETECTED Final   Klebsiella pneumoniae NOT DETECTED NOT DETECTED Final   Proteus species NOT DETECTED NOT DETECTED  Final   Serratia marcescens NOT DETECTED NOT DETECTED Final   Carbapenem resistance NOT DETECTED NOT DETECTED Final   Haemophilus influenzae NOT DETECTED NOT DETECTED Final   Neisseria meningitidis NOT DETECTED NOT DETECTED Final   Pseudomonas aeruginosa NOT DETECTED NOT DETECTED Final   Candida albicans NOT DETECTED NOT DETECTED Final   Candida glabrata NOT DETECTED NOT DETECTED Final   Candida krusei NOT DETECTED NOT DETECTED Final   Candida parapsilosis NOT DETECTED NOT DETECTED Final   Candida tropicalis NOT DETECTED NOT  DETECTED Final  Urine culture     Status: Abnormal   Collection Time: 12/14/15  4:22 PM  Result Value Ref Range Status   Specimen Description URINE, RANDOM  Final   Special Requests NONE  Final   Culture 20,000 COLONIES/mL STAPHYLOCOCCUS AUREUS (A)  Final   Report Status 12/17/2015 FINAL  Final   Organism ID, Bacteria STAPHYLOCOCCUS AUREUS (A)  Final      Susceptibility   Staphylococcus aureus - MIC*    CIPROFLOXACIN <=0.5 SENSITIVE Sensitive     GENTAMICIN <=0.5 SENSITIVE Sensitive     NITROFURANTOIN <=16 SENSITIVE Sensitive     OXACILLIN <=0.25 SENSITIVE Sensitive     TETRACYCLINE <=1 SENSITIVE Sensitive     VANCOMYCIN 1 SENSITIVE Sensitive     TRIMETH/SULFA <=10 SENSITIVE Sensitive     CLINDAMYCIN <=0.25 SENSITIVE Sensitive     RIFAMPIN <=0.5 SENSITIVE Sensitive     Inducible Clindamycin NEGATIVE Sensitive     * 20,000 COLONIES/mL STAPHYLOCOCCUS AUREUS  MRSA PCR Screening     Status: None   Collection Time: 12/14/15 11:57 PM  Result Value Ref Range Status   MRSA by PCR NEGATIVE NEGATIVE Final    Comment:        The GeneXpert MRSA Assay (FDA approved for NASAL specimens only), is one component of a comprehensive MRSA colonization surveillance program. It is not intended to diagnose MRSA infection nor to guide or monitor treatment for MRSA infections.   Culture, blood (Routine X 2) w Reflex to ID Panel     Status: None   Collection Time: 12/17/15   7:29 PM  Result Value Ref Range Status   Specimen Description BLOOD LEFT HAND  Final   Special Requests BOTTLES DRAWN AEROBIC ONLY 5CC  Final   Culture NO GROWTH 5 DAYS  Final   Report Status 12/22/2015 FINAL  Final  Culture, blood (Routine X 2) w Reflex to ID Panel     Status: None   Collection Time: 12/17/15  7:45 PM  Result Value Ref Range Status   Specimen Description BLOOD LEFT ANTECUBITAL  Final   Special Requests BOTTLES DRAWN AEROBIC ONLY 5CC  Final   Culture NO GROWTH 5 DAYS  Final   Report Status 12/22/2015 FINAL  Final  Culture, expectorated sputum-assessment     Status: None   Collection Time: 12/18/15  9:44 AM  Result Value Ref Range Status   Specimen Description EXPECTORATED SPUTUM  Final   Special Requests NONE  Final   Sputum evaluation   Final    THIS SPECIMEN IS ACCEPTABLE. RESPIRATORY CULTURE REPORT TO FOLLOW.   Report Status 12/18/2015 FINAL  Final  Culture, respiratory (NON-Expectorated)     Status: None   Collection Time: 12/18/15  9:44 AM  Result Value Ref Range Status   Specimen Description EXPECTORATED SPUTUM  Final   Special Requests NONE  Final   Gram Stain   Final    ABUNDANT WBC PRESENT, PREDOMINANTLY PMN FEW BUDDING YEAST SEEN RARE GRAM POSITIVE COCCI IN PAIRS RARE GRAM NEGATIVE COCCOBACILLI    Culture   Final    RARE CANDIDA ALBICANS RARE METHICILLIN RESISTANT STAPHYLOCOCCUS AUREUS    Report Status 12/21/2015 FINAL  Final   Organism ID, Bacteria METHICILLIN RESISTANT STAPHYLOCOCCUS AUREUS  Final      Susceptibility   Methicillin resistant staphylococcus aureus - MIC*    CIPROFLOXACIN <=0.5 SENSITIVE Sensitive     ERYTHROMYCIN <=0.25 SENSITIVE Sensitive     GENTAMICIN <=0.5 SENSITIVE Sensitive     OXACILLIN <=0.25  RESISTANT Resistant     TETRACYCLINE <=1 SENSITIVE Sensitive     VANCOMYCIN 1 SENSITIVE Sensitive     TRIMETH/SULFA <=10 SENSITIVE Sensitive     CLINDAMYCIN <=0.25 SENSITIVE Sensitive     RIFAMPIN <=0.5 SENSITIVE Sensitive      Inducible Clindamycin NEGATIVE Sensitive     * RARE METHICILLIN RESISTANT STAPHYLOCOCCUS AUREUS  Acid Fast Smear (AFB)     Status: None   Collection Time: 12/18/15 11:48 AM  Result Value Ref Range Status   AFB Specimen Processing Concentration  Final   Acid Fast Smear Negative  Final    Comment: (NOTE) Performed At: [facility] [place], [place] [id number] Lindon Romp MD [id number]    Source (AFB) ASPIRATE  Final    Comment:  RLL POSTERIOR MEDIASTINAL MASS AT T7   Aerobic/Anaerobic Culture (surgical/deep wound)     Status: None (Preliminary result)   Collection Time: 12/18/15 11:48 AM  Result Value Ref Range Status   Specimen Description ASPIRATE  Final   Special Requests RLL POSTERIOR MEDIASTINAL MASS AT T7  Final   Gram Stain   Final    RARE WBC PRESENT, PREDOMINANTLY PMN NO ORGANISMS SEEN    Culture   Final    FEW STAPHYLOCOCCUS AUREUS NO ANAEROBES ISOLATED; CULTURE IN PROGRESS FOR 5 DAYS    Report Status PENDING  Incomplete   Organism ID, Bacteria STAPHYLOCOCCUS AUREUS  Final      Susceptibility   Staphylococcus aureus - MIC*    CIPROFLOXACIN <=0.5 SENSITIVE Sensitive     ERYTHROMYCIN <=0.25 SENSITIVE Sensitive     GENTAMICIN <=0.5 SENSITIVE Sensitive     OXACILLIN <=0.25 SENSITIVE Sensitive     TETRACYCLINE <=1 SENSITIVE Sensitive     VANCOMYCIN 1 SENSITIVE Sensitive     TRIMETH/SULFA <=10 SENSITIVE Sensitive     CLINDAMYCIN <=0.25 SENSITIVE Sensitive     RIFAMPIN <=0.5 SENSITIVE Sensitive     Inducible Clindamycin NEGATIVE Sensitive     * FEW STAPHYLOCOCCUS AUREUS    Radiology: No results found.  Medications:   . apixaban  5 mg Oral BID  . atorvastatin  40 mg Oral q1800  . cholecalciferol  5,000 Units Oral QHS  . clopidogrel  75 mg Oral Daily  . dexamethasone  10 mg Intravenous Q6H  . diltiazem  180 mg Oral Daily  . DULoxetine  30 mg Oral Q supper  . insulin aspart  0-9 Units Subcutaneous Q6H  . LORazepam  0.5 mg Oral Daily   . polyethylene glycol  17 g Oral Daily  . senna-docusate  1 tablet Oral BID  . timolol  1 drop Both Eyes BID  . vancomycin  1,250 mg Intravenous Q12H   Continuous Infusions:    Medical decision making is of moderate complexity and this patient is at moderate risk of deterioration, therefore this is a level 2 visit.    LOS: 9 days   [facility] Pager [phone]. If unable to reach me by pager, please call my cell phone at [phone].  *Please refer to [email or web address], password [email or web address] to get updated schedule on who will round on this patient, as hospitalists switch teams weekly. If 7PM-7AM, please contact night-coverage at [email or web address], password [email or web address] for any overnight needs.  12/23/2015, 7:26 AM

## 2015-12-24 ENCOUNTER — Ambulatory Visit: Payer: Medicare Other

## 2015-12-24 ENCOUNTER — Encounter (HOSPITAL_COMMUNITY): Payer: Self-pay

## 2015-12-24 ENCOUNTER — Inpatient Hospital Stay (HOSPITAL_COMMUNITY)
Admission: RE | Admit: 2015-12-24 | Discharge: 2015-12-25 | DRG: 052 | Disposition: A | Discharge status: Other Acute Inpatient Hospital | Payer: Medicare Other | Source: Intra-hospital | Attending: Physical Medicine & Rehabilitation | Admitting: Physical Medicine & Rehabilitation

## 2015-12-24 ENCOUNTER — Encounter (HOSPITAL_COMMUNITY): Payer: Self-pay | Admitting: Internal Medicine

## 2015-12-24 DIAGNOSIS — L89109 Pressure ulcer of unspecified part of back, unspecified stage: Secondary | ICD-10-CM

## 2015-12-24 DIAGNOSIS — I82402 Acute embolism and thrombosis of unspecified deep veins of left lower extremity: Secondary | ICD-10-CM

## 2015-12-24 DIAGNOSIS — R339 Retention of urine, unspecified: Secondary | ICD-10-CM

## 2015-12-24 DIAGNOSIS — S22069D Unspecified fracture of T7-T8 vertebra, subsequent encounter for fracture with routine healing: Secondary | ICD-10-CM

## 2015-12-24 DIAGNOSIS — I82432 Acute embolism and thrombosis of left popliteal vein: Secondary | ICD-10-CM

## 2015-12-24 DIAGNOSIS — A4902 Methicillin resistant Staphylococcus aureus infection, unspecified site: Secondary | ICD-10-CM

## 2015-12-24 DIAGNOSIS — W19XXXD Unspecified fall, subsequent encounter: Secondary | ICD-10-CM

## 2015-12-24 DIAGNOSIS — R7881 Bacteremia: Secondary | ICD-10-CM

## 2015-12-24 DIAGNOSIS — N319 Neuromuscular dysfunction of bladder, unspecified: Secondary | ICD-10-CM

## 2015-12-24 DIAGNOSIS — E1169 Type 2 diabetes mellitus with other specified complication: Secondary | ICD-10-CM

## 2015-12-24 DIAGNOSIS — A4901 Methicillin susceptible Staphylococcus aureus infection, unspecified site: Secondary | ICD-10-CM | POA: Diagnosis not present

## 2015-12-24 DIAGNOSIS — K592 Neurogenic bowel, not elsewhere classified: Secondary | ICD-10-CM | POA: Diagnosis not present

## 2015-12-24 DIAGNOSIS — Z981 Arthrodesis status: Secondary | ICD-10-CM

## 2015-12-24 DIAGNOSIS — I10 Essential (primary) hypertension: Secondary | ICD-10-CM | POA: Diagnosis not present

## 2015-12-24 DIAGNOSIS — A4101 Sepsis due to Methicillin susceptible Staphylococcus aureus: Secondary | ICD-10-CM

## 2015-12-24 DIAGNOSIS — I480 Paroxysmal atrial fibrillation: Secondary | ICD-10-CM | POA: Diagnosis present

## 2015-12-24 DIAGNOSIS — D62 Acute posthemorrhagic anemia: Secondary | ICD-10-CM | POA: Diagnosis not present

## 2015-12-24 DIAGNOSIS — Z7952 Long term (current) use of systemic steroids: Secondary | ICD-10-CM

## 2015-12-24 DIAGNOSIS — S22069S Unspecified fracture of T7-T8 vertebra, sequela: Secondary | ICD-10-CM

## 2015-12-24 DIAGNOSIS — K921 Melena: Secondary | ICD-10-CM

## 2015-12-24 DIAGNOSIS — Z7902 Long term (current) use of antithrombotics/antiplatelets: Secondary | ICD-10-CM

## 2015-12-24 DIAGNOSIS — G8220 Paraplegia, unspecified: Secondary | ICD-10-CM

## 2015-12-24 DIAGNOSIS — E11622 Type 2 diabetes mellitus with other skin ulcer: Secondary | ICD-10-CM

## 2015-12-24 DIAGNOSIS — L98419 Non-pressure chronic ulcer of buttock with unspecified severity: Secondary | ICD-10-CM | POA: Diagnosis not present

## 2015-12-24 DIAGNOSIS — R209 Unspecified disturbances of skin sensation: Secondary | ICD-10-CM

## 2015-12-24 DIAGNOSIS — Z79899 Other long term (current) drug therapy: Secondary | ICD-10-CM

## 2015-12-24 DIAGNOSIS — L89159 Pressure ulcer of sacral region, unspecified stage: Secondary | ICD-10-CM

## 2015-12-24 DIAGNOSIS — Z7901 Long term (current) use of anticoagulants: Secondary | ICD-10-CM | POA: Diagnosis not present

## 2015-12-24 DIAGNOSIS — Z87891 Personal history of nicotine dependence: Secondary | ICD-10-CM | POA: Diagnosis not present

## 2015-12-24 DIAGNOSIS — E785 Hyperlipidemia, unspecified: Secondary | ICD-10-CM | POA: Diagnosis not present

## 2015-12-24 DIAGNOSIS — S24102S Unspecified injury at T2-T6 level of thoracic spinal cord, sequela: Secondary | ICD-10-CM

## 2015-12-24 DIAGNOSIS — M4624 Osteomyelitis of vertebra, thoracic region: Secondary | ICD-10-CM

## 2015-12-24 DIAGNOSIS — R208 Other disturbances of skin sensation: Secondary | ICD-10-CM

## 2015-12-24 DIAGNOSIS — Z8673 Personal history of transient ischemic attack (TIA), and cerebral infarction without residual deficits: Secondary | ICD-10-CM

## 2015-12-24 DIAGNOSIS — E1149 Type 2 diabetes mellitus with other diabetic neurological complication: Secondary | ICD-10-CM

## 2015-12-24 DIAGNOSIS — S24102A Unspecified injury at T2-T6 level of thoracic spinal cord, initial encounter: Secondary | ICD-10-CM

## 2015-12-24 DIAGNOSIS — L8990 Pressure ulcer of unspecified site, unspecified stage: Secondary | ICD-10-CM

## 2015-12-24 HISTORY — DX: Paroxysmal atrial fibrillation: I48.0

## 2015-12-24 HISTORY — DX: Peripheral vascular disease, unspecified: I73.9

## 2015-12-24 HISTORY — DX: Bradycardia, unspecified: R00.1

## 2015-12-24 HISTORY — DX: Paraplegia, unspecified: G82.20

## 2015-12-24 HISTORY — DX: Essential (primary) hypertension: I10

## 2015-12-24 HISTORY — DX: Disorder of arteries and arterioles, unspecified: I77.9

## 2015-12-24 HISTORY — DX: Other nonspecific abnormal finding of lung field: R91.8

## 2015-12-24 LAB — GLUCOSE, CAPILLARY
GLUCOSE-CAPILLARY: 131 mg/dL — AB (ref 65–99)
GLUCOSE-CAPILLARY: 124 mg/dL — AB (ref 65–99)
Glucose-Capillary: 117 mg/dL — ABNORMAL HIGH (ref 65–99)
Glucose-Capillary: 132 mg/dL — ABNORMAL HIGH (ref 65–99)
Glucose-Capillary: 147 mg/dL — ABNORMAL HIGH (ref 65–99)

## 2015-12-24 LAB — VANCOMYCIN, TROUGH: Vancomycin Tr: 16 ug/mL (ref 15–20)

## 2015-12-24 MED ORDER — GUAIFENESIN-DM 100-10 MG/5ML PO SYRP: 5.0000 mL | ORAL_SOLUTION | ORAL | 0 refills | Status: DC | PRN

## 2015-12-24 MED ORDER — CEFAZOLIN SODIUM-DEXTROSE 2-4 GM/100ML-% IV SOLN: 2.0000 g | Freq: Three times a day (TID) | INTRAVENOUS | Status: DC

## 2015-12-24 MED ORDER — ONDANSETRON HCL 4 MG/2ML IJ SOLN: 4.0000 mg | Freq: Four times a day (QID) | INTRAMUSCULAR | Status: DC | PRN

## 2015-12-24 MED ORDER — APIXABAN 5 MG PO TABS: 5.0000 mg | ORAL_TABLET | Freq: Two times a day (BID) | ORAL | Status: DC

## 2015-12-24 MED ORDER — ACETAMINOPHEN 325 MG PO TABS: 325.0000 mg | ORAL_TABLET | ORAL | Status: DC | PRN

## 2015-12-24 MED ORDER — DILTIAZEM HCL ER COATED BEADS 180 MG PO CP24
180.0000 mg | ORAL_CAPSULE | Freq: Every day | ORAL | Status: DC
  Administered 2015-12-25: 180 mg via ORAL
  Filled 2015-12-24: qty 1

## 2015-12-24 MED ORDER — CLOPIDOGREL BISULFATE 75 MG PO TABS
75.0000 mg | ORAL_TABLET | Freq: Every day | ORAL | Status: DC
  Administered 2015-12-25: 75 mg via ORAL
  Filled 2015-12-24: qty 1

## 2015-12-24 MED ORDER — INFLUENZA VAC SPLIT QUAD 0.5 ML IM SUSY
0.5000 mL | PREFILLED_SYRINGE | INTRAMUSCULAR | Status: DC | PRN
Start: 2015-12-25 — End: 2015-12-25

## 2015-12-24 MED ORDER — SENNOSIDES-DOCUSATE SODIUM 8.6-50 MG PO TABS
1.0000 | ORAL_TABLET | Freq: Two times a day (BID) | ORAL | Status: DC
  Administered 2015-12-25 (×2): 1 via ORAL
  Filled 2015-12-24 (×3): qty 1

## 2015-12-24 MED ORDER — POLYETHYLENE GLYCOL 3350 17 G PO PACK: 17.0000 g | PACK | Freq: Every day | ORAL | 0 refills | Status: AC

## 2015-12-24 MED ORDER — VANCOMYCIN HCL 10 G IV SOLR
1250.0000 mg | Freq: Two times a day (BID) | INTRAVENOUS | Status: DC
  Administered 2015-12-24: 1250 mg via INTRAVENOUS
  Filled 2015-12-24 (×3): qty 1250

## 2015-12-24 MED ORDER — POLYETHYLENE GLYCOL 3350 17 G PO PACK
17.0000 g | PACK | Freq: Every day | ORAL | Status: DC
  Administered 2015-12-25: 17 g via ORAL
  Filled 2015-12-24: qty 1

## 2015-12-24 MED ORDER — VITAMIN D 1000 UNITS PO TABS
5000.0000 [IU] | ORAL_TABLET | Freq: Every day | ORAL | Status: DC
  Administered 2015-12-24: 5000 [IU] via ORAL
  Filled 2015-12-24: qty 5

## 2015-12-24 MED ORDER — SENNOSIDES-DOCUSATE SODIUM 8.6-50 MG PO TABS: 1.0000 | ORAL_TABLET | Freq: Two times a day (BID) | ORAL | Status: AC

## 2015-12-24 MED ORDER — DEXAMETHASONE 6 MG PO TABS
10.0000 mg | ORAL_TABLET | Freq: Four times a day (QID) | ORAL | Status: DC
  Administered 2015-12-24 – 2015-12-25 (×5): 10 mg via ORAL
  Filled 2015-12-24 (×5): qty 1

## 2015-12-24 MED ORDER — DEXAMETHASONE SODIUM PHOSPHATE 10 MG/ML IJ SOLN: 10.0000 mg | Freq: Four times a day (QID) | INTRAMUSCULAR | 0 refills | Status: DC

## 2015-12-24 MED ORDER — IPRATROPIUM-ALBUTEROL 0.5-2.5 (3) MG/3ML IN SOLN: 3.0000 mL | RESPIRATORY_TRACT | Status: DC | PRN

## 2015-12-24 MED ORDER — LORAZEPAM 0.5 MG PO TABS: 0.5000 mg | ORAL_TABLET | Freq: Two times a day (BID) | ORAL | Status: DC | PRN

## 2015-12-24 MED ORDER — VANCOMYCIN HCL 10 G IV SOLR: 1250.0000 mg | Freq: Two times a day (BID) | INTRAVENOUS | Status: DC

## 2015-12-24 MED ORDER — SODIUM CHLORIDE 0.9% FLUSH
10.0000 mL | INTRAVENOUS | Status: DC | PRN
  Administered 2015-12-24: 10 mL

## 2015-12-24 MED ORDER — INSULIN ASPART 100 UNIT/ML ~~LOC~~ SOLN
0.0000 [IU] | Freq: Four times a day (QID) | SUBCUTANEOUS | Status: DC
  Administered 2015-12-24 – 2015-12-25 (×3): 1 [IU] via SUBCUTANEOUS

## 2015-12-24 MED ORDER — APIXABAN 5 MG PO TABS
5.0000 mg | ORAL_TABLET | Freq: Two times a day (BID) | ORAL | Status: DC
  Administered 2015-12-24: 5 mg via ORAL
  Filled 2015-12-24 (×2): qty 1

## 2015-12-24 MED ORDER — HYDROCODONE-ACETAMINOPHEN 10-325 MG PO TABS
1.0000 | ORAL_TABLET | ORAL | Status: DC | PRN
  Administered 2015-12-24 – 2015-12-25 (×2): 1 via ORAL
  Filled 2015-12-24 (×2): qty 1

## 2015-12-24 MED ORDER — LORAZEPAM 0.5 MG PO TABS
0.5000 mg | ORAL_TABLET | Freq: Every day | ORAL | Status: DC
  Administered 2015-12-25: 0.5 mg via ORAL
  Filled 2015-12-24: qty 1

## 2015-12-24 MED ORDER — ATORVASTATIN CALCIUM 40 MG PO TABS: 40.0000 mg | ORAL_TABLET | Freq: Every day | ORAL | Status: DC

## 2015-12-24 MED ORDER — ONDANSETRON HCL 4 MG PO TABS
4.0000 mg | ORAL_TABLET | Freq: Four times a day (QID) | ORAL | Status: DC | PRN
  Administered 2015-12-25: 4 mg via ORAL
  Filled 2015-12-24: qty 1

## 2015-12-24 MED ORDER — SORBITOL 70 % SOLN: 30.0000 mL | Freq: Every day | Status: DC | PRN

## 2015-12-24 MED ORDER — DILTIAZEM HCL ER COATED BEADS 180 MG PO CP24: 180.0000 mg | ORAL_CAPSULE | Freq: Every day | ORAL | Status: AC

## 2015-12-24 MED ORDER — TIMOLOL MALEATE 0.5 % OP SOLN
1.0000 [drp] | Freq: Two times a day (BID) | OPHTHALMIC | Status: DC
  Administered 2015-12-24 – 2015-12-25 (×2): 1 [drp] via OPHTHALMIC
  Filled 2015-12-24: qty 5

## 2015-12-24 MED ORDER — DULOXETINE HCL 30 MG PO CPEP: 30.0000 mg | ORAL_CAPSULE | Freq: Every day | ORAL | Status: DC

## 2015-12-24 NOTE — Progress Notes (Signed)
Physical Therapy Treatment Patient Details Name: Marcus [name] MRN: [id number] DOB: Sep 12, 1940 Today's Date: 12/24/2015    History of Present Illness 75 y.o. male admitted to [facility] on 12/14/15 for LE weakness after fall, pain.  Initial eval revealed CAP, L lower lobe mass (bx on 12/18/15), and closed fx of T7 vertebrae (resultant weakness and loss of sensation from T7 down), urinary retention, A-fib with RVR.  Neurosurgeon consulted and pt not thought to be a surgical candidate.  Pt with significant PMhx of urethral stricture, DM, TIA, stroke, low back pain, essential tremor, anxiety, and spinal fusion surgery.    PT Comments    Patient tolerated sitting balance exercises and facilitation of trunk musculature this session. Continues to be anxious and fearful of falling and limited today by pain despite being premedicated. Enjoyed listening to [employer] during session to ease nerves. Plans to possibly be discharged to CIR today pending test results. Will plan for transfer to w/c next session as tolerated.   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 Anxious.  Transfers                 General transfer  comment: Declined slide board transfer today due to pain.  Ambulation/Gait                 Stairs            Wheelchair Mobility    Modified Rankin (Stroke Patients Only)       Balance Overall balance assessment: Needs assistance Sitting-balance support: Feet supported;Bilateral upper extremity supported Sitting balance-Leahy Scale: Poor Sitting balance - Comments: Fearful of falling today during activation of trunk musculature and balance exercises. Practiced lateral reaching to right and left x5 with max A as well as anterior lean to activate lower abdomen.  Postural control: Posterior lean                          Cognition Arousal/Alertness: Awake/alert Behavior During Therapy: Anxious Overall Cognitive Status: Impaired/Different from baseline Area of Impairment: Problem solving             Problem Solving: Difficulty sequencing;Requires verbal cues;Requires tactile cues      Exercises      General Comments General comments (skin integrity, edema, etc.): Seems to have some reflexive movement in his feet esp his great toe when touched, but cannot activate them actively.      Pertinent Vitals/Pain Pain Assessment: Faces Faces Pain Scale: Hurts whole lot Pain Location: right chest wraps left to right around Pain Descriptors / Indicators: Grimacing;Guarding;Aching Pain Intervention(s): Monitored during session;Repositioned;Premedicated before session    Home Living                      Prior Function  PT Goals (current goals can now be found in the care plan section) Progress towards PT goals: Progressing toward goals (slowly)    Frequency    Min 5X/week      PT Plan Current plan remains appropriate    Co-evaluation             End of Session   Activity Tolerance: Patient limited by fatigue;Patient limited by pain Patient left: in bed;with call bell/phone within reach     Time: 1003-1036 PT  Time Calculation (min) (ACUTE ONLY): 33 min  Charges:  $Therapeutic Activity: 8-22 mins $Neuromuscular Re-education: 8-22 mins                    G Codes:      Paisli Silfies A Indigo Barbian 12/24/2015, 11:21 AM Wray Kearns, PT, DPT [phone]

## 2015-12-24 NOTE — Progress Notes (Addendum)
Pharmacy Antibiotic Note  Marcus [name] is a 75 y.o. male admitted on [id number] with complicated [name] with lung mass and thoracic lytic lesion.  Possible pulmonary abscess. Pharmacy has been consulted for vancomycin dosing.  Vancomycin trough at goal level today.  Plan: 1. Continue vancomycin 1250 mg IV q 12 hrs. 2. Recheck vancomycin trough in ~ 1 week. 3. Spoke with [name].  Per discussion, planning two weeks of vancomycin then cefazolin to finish 6 weeks of antibiotics.  Height: 5' 7.5" (171.5 cm) Weight: 205 lb 4.8 oz (93.1 kg) IBW/kg (Calculated) : 67.25  Temp (24hrs), Avg:98 F (36.7 C), Min:97.7 F (36.5 C), Max:98.1 F (36.7 C)   Recent Labs Lab 12/18/15 0429 12/19/15 0356 12/21/15 0543 12/23/15 0738 12/24/15 0930  WBC 9.2 7.6 7.9  --   --   CREATININE 0.84 0.77 0.70 0.64  --   VANCOTROUGH  --   --   --   --  16    Estimated Creatinine Clearance: 91.6 mL/min (by C-G formula based on SCr of 0.64 mg/dL).    Allergies  Allergen Reactions  . Codeine Other (See Comments)    constipation  . Demerol [Meperidine] Other (See Comments)    Drops blood pressure   . Contrast Media [Iodinated Diagnostic Agents] Nausea And Vomiting  . Gabapentin Other (See Comments)    Suicidal, depression  . Other Other (See Comments)    All narcotics make pt hallucinate    Antimicrobials this admission:  10/14 CTX >> 10/15 10/14 Azithromycin >> 10/15 10/15 Cefazolin >> 10/22 10/22 Vancomycin >>   Dose adjustments this admission:  10/24 VT = 16 - con't vanc 1250 q 12 hrs.  Microbiology results:  10/14 BCx: BCID +MSSA 10/14 UCx: mssa  10/15 MRSA PCR: neg 10/17 BCx x 2: Neg 10/18 Sputum Cx: MRSA  10/18 Mediastinal mass cx> MSSA   Thank you for allowing pharmacy to be a part of this patient's care.  Uvaldo Rising, BCPS  Clinical Pharmacist Pager [phone]  12/24/2015 11:11 AM

## 2015-12-24 NOTE — Progress Notes (Signed)
Updated report received in patient's room via [place], updated on new orders and events of the day, assumed care of patient.

## 2015-12-24 NOTE — Progress Notes (Signed)
Pharmacy Antibiotic Note  Marcus [name] is a 75 y.o. male admitted on [id number] with complicated [name] with lung mass and thoracic lytic lesion.  Possible pulmonary abscess. Pharmacy has been consulted for vancomycin dosing.  Vancomycin trough at goal level today.  Plan: 1. Continue vancomycin 1250 mg IV q 12 hrs through 01/05/16 2. Recheck vancomycin trough as necessary 3. Per [name] planning two weeks of vancomycin then cefazolin to finish 6 weeks of antibiotics (to be completed 01/29/16).  Height: 5\' 7"  (170.2 cm) Weight: 208 lb (94.3 kg) IBW/kg (Calculated) : 66.1  Temp (24hrs), Avg:98 F (36.7 C), Min:97.7 F (36.5 C), Max:98.2 F (36.8 C)   Recent Labs Lab 12/18/15 0429 12/19/15 0356 12/21/15 0543 12/23/15 0738 12/24/15 0930  WBC 9.2 7.6 7.9  --   --   CREATININE 0.84 0.77 0.70 0.64  --   VANCOTROUGH  --   --   --   --  16    Estimated Creatinine Clearance: 91.4 mL/min (by C-G formula based on SCr of 0.64 mg/dL).    Allergies  Allergen Reactions  . Codeine Other (See Comments)    constipation  . Demerol [Meperidine] Other (See Comments)    Drops blood pressure   . Contrast Media [Iodinated Diagnostic Agents] Nausea And Vomiting  . Gabapentin Other (See Comments)    Suicidal, depression  . Other Other (See Comments)    All narcotics make pt hallucinate    Antimicrobials this admission:  10/14 CTX >> 10/15 10/14 Azithromycin >> 10/15 10/15 Cefazolin >> 10/22 10/22 Vancomycin >>   Dose adjustments this admission:  10/24 VT = 16 - con't vanc 1250 q 12 hrs.  Microbiology results:  10/14 BCx: BCID +MSSA 10/14 UCx: mssa  10/15 MRSA PCR: neg 10/17 BCx x 2: Neg 10/18 Sputum Cx: MRSA  10/18 Mediastinal mass cx> MSSA  Thank you for allowing pharmacy to be a part of this patient's care.  Jodean Lima Jeancarlo Leffler 12/24/2015 6:48 PM

## 2015-12-24 NOTE — Discharge Summary (Signed)
Physician Discharge Summary  KEYJUAN [name] [id number] DOB: Mar 20, 1940 DOA: 12/14/2015  PCP: Jeanmarie Hubert, MD  Admit date: 12/14/2015 Discharge date: 12/24/2015  Admitted From: Home Discharge disposition: CIR   Recommendations for Outpatient Follow-Up:   1. The patient will need Vancomycin through 01/05/16.  Will need to resume Cefazolin 2 grams IV Q 8 hours after Vancomycin for a total treatment course of 6 weeks (to be completed 01/29/16).   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  Diet recommendation: Low sodium, heart healthy.    History of Present Illness:   Marcus [name] is an 75 y.o. male with PMH of hypertension, diabetes, TIA and GERD as well as recent UTI who was admitted 12/14/15 with chief complaint of new onset lower extremity weakness. In the ED, he developed atrial fibrillation with RVR and was placed on a Cardizem drip and given Lopressor. Urology was consulted for urinary retention and placed a Foley catheter. Broad-spectrum antibiotics were initiated. Imaging showed a closed fracture of T7 and a right lower lobe lung mass raising the question of whether he had a malignant process versus severe infection. CT-guided  biopsy done 12/18/15. Radiation simulation done 12/17/15 but treatments have not been initiated pending pathology results. ID following for [name].   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 No evidence of malignancy of final pathology report. Blood cultures done on admission positive for MSSA, with surveillance blood cultures negative so far. Antibiotics switched to vancomycin 12/22/15 after sputum cultures grew MRSA. The patient will need 2 weeks of vancomycin and a switch of his antibiotics back to Cefazolin to complete a 6 week course of antibiotics.  PICC line Placed 12/21/15. TEE done 12/20/15, negative for endocarditis. CIR consulted for consideration of inpatient rehabilitation. Will need to wean Decadron.    Atrial fibrillation with RVR ([place]) Patient was placed on a Cardizem drip on admission which was discontinued secondary to low blood pressure. Currently on oral Cardizem. Was in a junctional rhythm 12/21/15, so cardiologist discontinued digoxin. Eliquis resumed 12/22/15.  Active Problems:   Essential hypertension Does not appear to be on antihypertensives at home, and IV Cardizem dropped his blood pressure. Blood pressure currently stable on oral Cardizem, 180 mg daily.    Carotid stenosis History of CEA.    Diabetes ([place]) Hemoglobin A1c 5.8% on 09/16/15. Mildly hyperglycemic here, likely secondary to Decadron. CBGs 113-152.    HLD (hyperlipidemia) Continue Lipitor.    CVA (cerebral vascular accident) ([place]) Continue Plavix.  Urinary retention Foley catheter placed by urology.    Pressure ulcer to the sacrum Evaluated by wound care nurse. Foam dressings ordered. Pressure reduction mattress ordered.   Medical  Consultants:    Cardiology  Radiation Oncology  Neurosurgery  Infectious Disease  Urology  Physical medicine and rehabilitation   Discharge Exam:   Vitals:   12/24/15 1102 12/24/15 1210  BP: (!) 147/86 135/77  Pulse:  62  Resp:  18  Temp:  98.1 F (36.7 C)   Vitals:   12/24/15 0415 12/24/15 0734 12/24/15 1102 12/24/15 1210  BP: 125/80 (!) 148/80 (!) 147/86 135/77  Pulse: 61 65  62  Resp: 16 17  18   Temp: 97.7 F (36.5 C) 98.1 F (36.7 C)  98.1 F (36.7 C)  TempSrc: Oral Oral  Oral  SpO2: 95% 96%    Weight: 93.1 kg (205 lb 4.8 oz)     Height:        General exam: Appears calm and comfortable. [place].  Respiratory system: Diminished but clear. Respiratory effort normal. Cardiovascular system: S1 & S2 heard, RRR. No JVD,  rubs, gallops or clicks. No murmurs. Gastrointestinal system: Abdomen is nondistended, soft and nontender. No organomegaly or masses felt. Normal bowel sounds heard. Central nervous system: Alert, disoriented. Lower extremity paralysis. Extremities: No clubbing,  or cyanosis. 1+ edema. Skin: Deep tissue injury to sacrum otherwise no rashes, lesions or ulcers. Psychiatry: Judgement and insight appear diminished. Mood & affect appropriate.    The results of significant diagnostics from this hospitalization (including imaging, microbiology, ancillary and laboratory) are listed below for reference.     Procedures and Diagnostic Studies:   Dg Chest 2 View  Result Date: 12/17/2015 CLINICAL DATA:  Shortness of breath, left-sided chest pain EXAM: CHEST  2 VIEW COMPARISON:  CT chest of 12/14/2015, chest x-ray of 12/14/2015, and two-view chest x-ray of 11/12/2015 FINDINGS: There is abnormal opacity overlying the right hilum and suprahilar region consistent with the soft tissue mass described on recent CT of the chest. In view of the rapid development since chest x-ray of 11/12/2015, an infectious or inflammatory process would be more likely than  neoplasm. No pneumonia is seen and there is a small right pleural effusion present. The heart is within upper limits normal. Partial compression of T8 and the anterior inferior aspect of T7 is noted which could also either be infectious, inflammatory, or neoplastic in origin. IMPRESSION: 1. Abnormal opacity overlies the right hilum/suprahilar region consistent with the previously described soft tissue mass involving the posterior medial right lower lobe and thoracic vertebral column. Favor infectious or inflammatory process as noted above. 2. Small right pleural effusion. Electronically Signed   By: Ivar Drape M.D.   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 He also has new onset paraplegia. CT-guided biopsy is warranted to obtain tissue diagnosis. EXAM: CT GUIDANCE NEEDLE PLACEMENT MEDICATIONS: None. ANESTHESIA/SEDATION: Moderate (conscious) sedation was employed during this procedure. A total of Versed 2 mg and Fentanyl 100 mcg was administered intravenously. Moderate Sedation Time: 14 minutes. The patient's level of consciousness and vital signs were monitored continuously by radiology nursing throughout the procedure under my direct supervision. FLUOROSCOPY TIME:  Fluoroscopy Time: 0 minutes 0 seconds (0 mGy). COMPLICATIONS: None immediate. PROCEDURE: Informed written consent was obtained from the patient after a thorough discussion of the procedural risks, benefits and alternatives.  All questions were addressed. Maximal Sterile Barrier Technique was utilized including caps, mask, sterile gowns, sterile gloves, sterile drape, hand hygiene and skin antiseptic. A timeout was performed prior to the initiation of the procedure. A planning axial CT scan was performed. The infiltrative mass along the medial aspect of the right lower lobe directly  invading the posterior mediastinum was successfully identified. A suitable skin entry site was selected and marked. The region was sterilely prepped and draped in the standard fashion using chlorhexidine skin prep. Local anesthesia was attained by infiltration with 1% lidocaine. A small dermatotomy was made. Under intermittent CT guidance, a 17 gauge introducer needle was advanced into the margin of the soft tissue mass. Multiple 18 gauge core biopsies were then coaxially obtained using the bio Pince automated biopsy device. Biopsy specimens were placed in saline and delivered to pathology for further analysis. Additional 20 gauge needle aspirate was performed and inject into a small amount of sterile saline for cultures. Post biopsy axial CT imaging demonstrates no evidence of pneumothorax, hemorrhage or other complicating feature. IMPRESSION: Technically successful CT-guided core biopsy of right lower lobe/ posterior mediastinal mass lesion. Signed, Criselda Peaches, MD [facility] Radiology Electronically Signed   By: Jacqulynn Cadet M.D.   On: 12/18/2015 12:58     Labs:   Basic Metabolic Panel:  Recent Labs Lab 12/18/15 0429 12/19/15 0356 12/21/15 0543 12/23/15 0738  NA 134* 139 135  --   K 4.1 4.4 4.7  --   CL 101 107 102  --   CO2 25 26 27   --   GLUCOSE 148* 145* 138*  --   BUN 27* 33* 31*  --   CREATININE 0.84 0.77 0.70 0.64  CALCIUM 8.1* 8.2* 7.9*  --   MG 2.2 2.3  --   --   PHOS 3.3  --   --   --    GFR Estimated Creatinine Clearance: 91.6 mL/min (by C-G formula based on SCr of 0.64 mg/dL). Liver Function Tests:  Recent Labs Lab 12/18/15 0429 12/19/15 0356  AST 61* 69*  ALT 33 37  ALKPHOS 116 114  BILITOT 0.2* 0.3  PROT 5.7* 5.4*  ALBUMIN 1.5* 1.5*    CBC:  Recent Labs Lab 12/18/15 0429 12/19/15 0356 12/21/15 0543  WBC 9.2 7.6 7.9  NEUTROABS 8.4* 7.0  --   HGB 10.3* 10.4* 11.5*  HCT 32.0* 32.5* 35.3*    MCV 92.5 92.6 92.4  PLT 376 359 400   CBG:  Recent Labs Lab 12/23/15 1115 12/23/15 1818 12/23/15 2355 12/24/15 0641 12/24/15 1208  GLUCAP 113* 152* 122* 131* 117*   Microbiology Recent Results (from the past 240 hour(s))  Urine culture     Status: Abnormal   Collection Time: 12/14/15  4:22 PM  Result Value Ref Range Status   Specimen Description URINE, RANDOM  Final   Special Requests NONE  Final   Culture 20,000 COLONIES/mL STAPHYLOCOCCUS AUREUS (A)  Final   Report Status 12/17/2015 FINAL  Final   Organism ID, Bacteria STAPHYLOCOCCUS AUREUS (A)  Final      Susceptibility   Staphylococcus aureus - MIC*    CIPROFLOXACIN <=0.5 SENSITIVE Sensitive     GENTAMICIN <=0.5 SENSITIVE Sensitive     NITROFURANTOIN <=16 SENSITIVE Sensitive     OXACILLIN <=0.25 SENSITIVE Sensitive     TETRACYCLINE <=1 SENSITIVE Sensitive     VANCOMYCIN 1 SENSITIVE Sensitive     TRIMETH/SULFA <=10 SENSITIVE Sensitive     CLINDAMYCIN <=  0.25 SENSITIVE Sensitive     RIFAMPIN <=0.5 SENSITIVE Sensitive     Inducible Clindamycin NEGATIVE Sensitive     * 20,000 COLONIES/mL STAPHYLOCOCCUS AUREUS  MRSA PCR Screening     Status: None   Collection Time: 12/14/15 11:57 PM  Result Value Ref Range Status   MRSA by PCR NEGATIVE NEGATIVE Final    Comment:        The GeneXpert MRSA Assay (FDA approved for NASAL specimens only), is one component of a comprehensive MRSA colonization surveillance program. It is not intended to diagnose MRSA infection nor to guide or monitor treatment for MRSA infections.   Culture, blood (Routine X 2) w Reflex to ID Panel     Status: None   Collection Time: 12/17/15  7:29 PM  Result Value Ref Range Status   Specimen Description BLOOD LEFT HAND  Final   Special Requests BOTTLES DRAWN AEROBIC ONLY 5CC  Final   Culture NO GROWTH 5 DAYS  Final   Report Status 12/22/2015 FINAL  Final  Culture, blood (Routine X 2) w Reflex to ID Panel     Status: None   Collection Time:  12/17/15  7:45 PM  Result Value Ref Range Status   Specimen Description BLOOD LEFT ANTECUBITAL  Final   Special Requests BOTTLES DRAWN AEROBIC ONLY 5CC  Final   Culture NO GROWTH 5 DAYS  Final   Report Status 12/22/2015 FINAL  Final  Culture, expectorated sputum-assessment     Status: None   Collection Time: 12/18/15  9:44 AM  Result Value Ref Range Status   Specimen Description EXPECTORATED SPUTUM  Final   Special Requests NONE  Final   Sputum evaluation   Final    THIS SPECIMEN IS ACCEPTABLE. RESPIRATORY CULTURE REPORT TO FOLLOW.   Report Status 12/18/2015 FINAL  Final  Culture, respiratory (NON-Expectorated)     Status: None   Collection Time: 12/18/15  9:44 AM  Result Value Ref Range Status   Specimen Description EXPECTORATED SPUTUM  Final   Special Requests NONE  Final   Gram Stain   Final    ABUNDANT WBC PRESENT, PREDOMINANTLY PMN FEW BUDDING YEAST SEEN RARE GRAM POSITIVE COCCI IN PAIRS RARE GRAM NEGATIVE COCCOBACILLI    Culture   Final    RARE CANDIDA ALBICANS RARE METHICILLIN RESISTANT STAPHYLOCOCCUS AUREUS    Report Status 12/21/2015 FINAL  Final   Organism ID, Bacteria METHICILLIN RESISTANT STAPHYLOCOCCUS AUREUS  Final      Susceptibility   Methicillin resistant staphylococcus aureus - MIC*    CIPROFLOXACIN <=0.5 SENSITIVE Sensitive     ERYTHROMYCIN <=0.25 SENSITIVE Sensitive     GENTAMICIN <=0.5 SENSITIVE Sensitive     OXACILLIN <=0.25 RESISTANT Resistant     TETRACYCLINE <=1 SENSITIVE Sensitive     VANCOMYCIN 1 SENSITIVE Sensitive     TRIMETH/SULFA <=10 SENSITIVE Sensitive     CLINDAMYCIN <=0.25 SENSITIVE Sensitive     RIFAMPIN <=0.5 SENSITIVE Sensitive     Inducible Clindamycin NEGATIVE Sensitive     * RARE METHICILLIN RESISTANT STAPHYLOCOCCUS AUREUS  Fungus Culture With Stain     Status: None (Preliminary result)   Collection Time: 12/18/15 11:48 AM  Result Value Ref Range Status   Fungus Stain Final report  Final    Comment: (NOTE) Performed At: [facility] [place], [place] [id number] Lindon Romp MD [id number]    Fungus (Mycology) Culture PENDING  Incomplete   Fungal Source ASPIRATE  Final    Comment:  RLL POSTERIOR MEDIASTINAL  MASS AT T7   Acid Fast Smear (AFB)     Status: None   Collection Time: 12/18/15 11:48 AM  Result Value Ref Range Status   AFB Specimen Processing Concentration  Final   Acid Fast Smear Negative  Final    Comment: (NOTE) Performed At: [facility] [place], [place] [id number] Lindon Romp MD [id number]    Source (AFB) ASPIRATE  Final    Comment:  RLL POSTERIOR MEDIASTINAL MASS AT T7   Aerobic/Anaerobic Culture (surgical/deep wound)     Status: None   Collection Time: 12/18/15 11:48 AM  Result Value Ref Range Status   Specimen Description ASPIRATE  Final   Special Requests RLL POSTERIOR MEDIASTINAL MASS AT T7  Final   Gram Stain   Final    RARE WBC PRESENT, PREDOMINANTLY PMN NO ORGANISMS SEEN    Culture FEW STAPHYLOCOCCUS AUREUS NO ANAEROBES ISOLATED   Final   Report Status 12/23/2015 FINAL  Final   Organism ID, Bacteria STAPHYLOCOCCUS AUREUS  Final      Susceptibility   Staphylococcus aureus - MIC*    CIPROFLOXACIN <=0.5 SENSITIVE Sensitive     ERYTHROMYCIN <=0.25 SENSITIVE Sensitive     GENTAMICIN <=0.5 SENSITIVE Sensitive     OXACILLIN <=0.25 SENSITIVE Sensitive     TETRACYCLINE <=1 SENSITIVE Sensitive     VANCOMYCIN 1 SENSITIVE Sensitive     TRIMETH/SULFA <=10 SENSITIVE Sensitive     CLINDAMYCIN <=0.25 SENSITIVE Sensitive     RIFAMPIN <=0.5 SENSITIVE Sensitive     Inducible Clindamycin NEGATIVE Sensitive     * FEW STAPHYLOCOCCUS AUREUS  Fungus Culture Result     Status: None   Collection Time: 12/18/15 11:48 AM  Result Value Ref Range Status   Result 1 Comment  Final    Comment: (NOTE) KOH/Calcofluor preparation:  no fungus observed. Performed At: [facility] [place], [place] [id number] Lindon Romp MD [id number]      Discharge Instructions:   Discharge Instructions    Call MD for:  extreme fatigue    Complete by:  As directed    Call MD for:  severe uncontrolled pain    Complete by:  As directed    Diet - low sodium heart healthy    Complete by:  As directed    Increase activity slowly    Complete by:  As directed        Medication List    STOP taking these medications   acetaminophen 500 MG tablet Commonly known as:  TYLENOL   methocarbamol 500 MG tablet Commonly known as:  ROBAXIN   traMADol 50 MG tablet Commonly known as:  ULTRAM   trimethoprim 100 MG tablet Commonly known as:  TRIMPEX     TAKE these medications   apixaban 5 MG Tabs tablet Commonly known as:  ELIQUIS Take 1 tablet (5 mg total) by mouth 2 (two) times daily.   atorvastatin 40 MG tablet Commonly known as:  LIPITOR TAKE ONE TABLET BY MOUTH ONCE DAILY FOR CHOLESTEROL What changed:  See the new instructions.   clopidogrel 75 MG tablet Commonly known as:  PLAVIX TAKE 1 TABLET BY MOUTH DAILY   dexamethasone 10 MG/ML injection Commonly known as:  DECADRON Inject 1 mL (10 mg total) into the vein every 6 (six) hours.   diltiazem 180 MG 24 hr capsule Commonly known as:  CARDIZEM CD Take 1 capsule (180 mg total) by mouth daily. 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   HYDROcodone-acetaminophen 5-325 MG tablet Commonly known as:  NORCO/VICODIN Take 0.5-1 tablets by mouth every 6 (six) hours as needed for moderate pain.   LORazepam 0.5 MG tablet Commonly known as:  ATIVAN Take 0.5 mg by mouth See admin  instructions. Take 1 tablet (0.5 mg) by mouth every morning, may also take 1 tablet 2 more times during the day as needed for anxiety   nicotine polacrilex 2 MG lozenge Commonly known as:  COMMIT Take 2 mg by mouth every 2 (two) hours.   ondansetron 4 MG tablet Commonly known as:  ZOFRAN Take 1 tablet (4 mg total) by mouth every 8 (eight) hours as needed for nausea or vomiting.   polyethylene glycol packet Commonly known as:  MIRALAX / GLYCOLAX Take 17 g by mouth daily. Start taking on:  12/25/2015   senna-docusate 8.6-50 MG tablet Commonly known as:  Senokot-S Take 1 tablet by mouth 2 (two) times daily.   timolol 0.5 % ophthalmic solution Commonly known as:  BETIMOL Place 1 drop into both eyes 2 (two) times daily.   vancomycin 1,250 mg in sodium chloride 0.9 % 250 mL Inject 1,250 mg into the vein every 12 (twelve) hours.   Vitamin D3 5000 units Tabs Take 5,000 Units by mouth at bedtime.         Time coordinating discharge: > 35 minutes.  Signed:  Kody Brandl  Pager [phone] [facility] 12/24/2015, 12:49 PM

## 2015-12-24 NOTE — PMR Pre-admission (Signed)
PMR Admission Coordinator Pre-Admission Assessment  Patient: Marcus [name] is an 75 y.o., male MRN: [id number] DOB: December 27, 1940 Height: 5' 7.5" (171.5 cm) Weight: 93.1 kg (205 lb 4.8 oz)              Insurance Information HMO: Yes   PPO:       PCP:       IPA:       80/20:       OTHER:   PRIMARY: BCBS Medicare      Policy#: [id number]      Subscriber: [place] Name: Limmie Patricia      Phone#: [phone]     Fax#: [phone] Pre-Cert#:                               Employer:  [employer] Benefits:  Phone #: [phone]     Name: Milus Glazier. Date:  03/03/07     Deduct: $0      Out of Pocket Max: $4900 (met $454.83)      Life Max: unlimited CIR: $275 days 1-6      SNF:  $0 days 1-20; $164.50 days 21-100 Outpatient: medical necessity     Co-Pay: $40 Home Health: 100%      Co-Pay: none DME: 80%     Co-Pay: 20% Providers: in network  Medicaid Application Date:        Case Manager:   Disability Application Date:        Case Worker:    Emergency Contact Information Contact Information    Name Relation Home Work [place] Spouse [phone]    Demetriou,Colby Daughter [phone]       Current Medical History  Patient Admitting Diagnosis: T6 SCI with paraplegia, 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 One level home. 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 He presented 12/14/2015 with generalized weakness failure to thrive after recent fall.Patient found to be in atrial fibrillation with RVR. Initial chest x-ray evaluation revealed community-acquired pneumonia as well as findings of right lower lobe mass, closed  fracture of T7vertebrae. CT of the chest showed rounded masslike consolidation in the medial aspect of the superior segment right lower lobe measuring 4.7 x 4.9 cm new from 07/07/2013. 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 Follow-up neurosurgery Dr. Annette Stable with review of MRI thoracic spine no plan for surgical intervention at this time. CT-guided biopsy performed 12/18/2015 with results suggesting infection. Radiation oncology consulted in regards to findings of mass plan to begin simulation and radiation treatment.  Infectious disease consulted for [name] with osteomyelitis and advised intravenous Vancomycin through 01/05/2016 then began cefazolin 2 g intravenous every 8 hours after vancomycin completed for a total treatment course of 6 weeks to be completed 01/29/2016. However sputum cultures later showed MRSA with cefazolin discontinued and initiation of vancomycin . Echocardiogram without any vegetation. TEE on 12/21/2015 showed no endocarditis..  Cardiology continues to follow for atrial fibrillation with  RVR status post conversion normal sinus rhythm after being maintained on Cardizem drip transitioned to by mouth and the addition of Eliquis for anticoagulation in addition to Plavix. Physical and occupational therapy evaluations completed. Patient to be admitted for comprehensive inpatient rehabilitation program.   Past Medical History  Past Medical History:  Diagnosis Date  . Anxiety state, unspecified   . Atrial fibrillation with RVR ([place]) 12/14/2015  . Calculus of kidney   . 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 Complication of anesthesia    " I woke up in the recovery room with tube in throat and panicked."  . Essential  and other specified forms of tremor   . Herpes zoster without mention of complication   . History of hiatal hernia   . Hypertrophy of prostate with urinary obstruction and other lower urinary tract symptoms (LUTS)   . Insomnia, unspecified   . Lumbago   . MSSA (methicillin susceptible Staphylococcus aureus) septicemia ([place]) 12/23/2015  . Other and unspecified hyperlipidemia   . Paralysis of both lower limbs ([place]) 12/24/2015  . Pneumonia    bronchial  . Reflux esophagitis   . Seasonal allergies   . Stroke ([place])   . TIA (transient ischemic attack)   . Type II or unspecified type diabetes mellitus without mention of complication, not stated as uncontrolled   . Unspecified essential hypertension   . Unspecified vitamin D deficiency   . Urethral stricture unspecified   . Urinary frequency     Family History  family history includes Alcohol abuse in his brother; Cancer in his mother; Diabetes in his mother; Hypertension in his father; Stroke in his father.  Prior Rehab/Hospitalizations: No previous rehab admissions.  Has the patient had major surgery during 100 days prior to admission? No  Current Medications   Current Facility-Administered Medications:  .  acetaminophen (TYLENOL) tablet 1,000 mg, 1,000 mg, Oral, Q6H PRN, Bertram Savin Sheikh, DO .  apixaban (ELIQUIS) tablet 5 mg, 5 mg, Oral, BID, Delane Ginger, RPH, 5 mg at 12/24/15 1101 .  atorvastatin (LIPITOR) tablet 40 mg, 40 mg, Oral, q1800, Venetia Maxon Rama, MD, 40 mg at 12/23/15 1853 .  cholecalciferol (VITAMIN D) tablet 5,000 Units, 5,000 Units, Oral, QHS, Venetia Maxon Rama, MD, 5,000 Units at 12/23/15 2318 .  clopidogrel (PLAVIX) tablet 75 mg, 75 mg, Oral, Daily, Venetia Maxon Rama, MD, 75 mg at 12/24/15 1101 .  dexamethasone (DECADRON) injection 10 mg, 10 mg, Intravenous, Q6H, Omair Latif Sheikh, DO, 10 mg at 12/24/15 1249 .  diltiazem (CARDIZEM CD) 24 hr capsule 180 mg, 180 mg, Oral, Daily, Skeet Latch, MD, 180 mg at  12/24/15 1102 .  DULoxetine (CYMBALTA) DR capsule 30 mg, 30 mg, Oral, Q supper, Venetia Maxon Rama, MD, 30 mg at 12/23/15 1852 .  guaiFENesin-dextromethorphan (ROBITUSSIN DM) 100-10 MG/5ML syrup 5 mL, 5 mL, Oral, Q4H PRN, Bertram Savin Sheikh, DO, 5 mL at 12/18/15 0504 .  HYDROcodone-acetaminophen (NORCO) 10-325 MG per tablet 1 tablet, 1 tablet, Oral, Q4H PRN, Kerney Elbe, DO, 1 tablet at 12/24/15 0943 .  insulin aspart (novoLOG) injection 0-9 Units, 0-9 Units, Subcutaneous, Q6H, Venetia Maxon Rama, MD, 1 Units at 12/24/15 [phone] .  ipratropium-albuterol (DUONEB) 0.5-2.5 (3) MG/3ML nebulizer solution 3 mL, 3 mL, Nebulization, Q2H PRN, [employer], DO .  LORazepam (ATIVAN) tablet 0.5 mg, 0.5 mg, Oral, Daily, Jake Church Masters, RPH, 0.5 mg at 12/24/15 1101 .  LORazepam (ATIVAN) tablet 0.5 mg, 0.5 mg, Oral, BID PRN, Jake Church Masters,  RPH, 0.5 mg at 12/23/15 2319 .  morphine 2 MG/ML injection 2 mg, 2 mg, Intravenous, Q4H PRN, Bertram Savin Sheikh, DO, 2 mg at 12/24/15 1251 .  ondansetron (ZOFRAN) injection 4 mg, 4 mg, Intravenous, Q6H PRN, Lezlie Octave Black, NP .  polyethylene glycol (MIRALAX / GLYCOLAX) packet 17 g, 17 g, Oral, Daily, Lavina Hamman, MD, 17 g at 12/22/15 7615 .  senna-docusate (Senokot-S) tablet 1 tablet, 1 tablet, Oral, BID, Lavina Hamman, MD, 1 tablet at 12/23/15 2321 .  sodium chloride flush (NS) 0.9 % injection 10-40 mL, 10-40 mL, Intracatheter, PRN, Venetia Maxon Rama, MD, 10 mL at 12/23/15 2320 .  timolol (TIMOPTIC) 0.5 % ophthalmic solution 1 drop, 1 drop, Both Eyes, BID, [facility], DO, 1 drop at 12/24/15 1103 .  vancomycin (VANCOCIN) 1,250 mg in sodium chloride 0.9 % 250 mL IVPB, 1,250 mg, Intravenous, Q12H, Delane Ginger, RPH, 1,250 mg at 12/24/15 1056  Patients Current Diet: Diet Heart Room service appropriate? Yes; Fluid consistency: Thin Diet - low sodium heart healthy  Precautions / Restrictions Precautions Precautions: Fall Precaution Comments: T6-7  paraplegia/paresis Restrictions Weight Bearing Restrictions: No   Has the patient had 2 or more falls or a fall with injury in the past year?Yes.  Had 1 fall 12 days ago with resultant injury.  Prior Activity Level Community (5-7x/wk): Went out daily.  Was driving.  Home Assistive Devices / Equipment Home Assistive Devices/Equipment: Eyeglasses Home Equipment: Walker - 2 wheels  Prior Device Use: Indicate devices/aids used by the patient prior to current illness, exacerbation or injury? None  Prior Functional Level Prior Function Level of Independence: Independent Comments: works full time as a used Musician Care: Did the patient need help bathing, dressing, using the toilet or eating?  Independent  Indoor Mobility: Did the patient need assistance with walking from room to room (with or without device)? Independent  Stairs: Did the patient need assistance with internal or external stairs (with or without device)? Independent  Functional Cognition: Did the patient need help planning regular tasks such as shopping or remembering to take medications? Independent  Current Functional Level Cognition  Overall Cognitive Status: Impaired/Different from baseline Current Attention Level: Sustained, Selective Orientation Level: Oriented X4 General Comments: Pt with variable orientation.  He will answer incorrectly at times,but then able to back track and correct himself.  He was somewhat argumentative with his wife and daughter.  The more he argued with them, the more disconnected his conversation became - RN notified     Extremity Assessment (includes Sensation/Coordination)  Upper Extremity Assessment: RUE deficits/detail, LUE deficits/detail, Generalized weakness RUE Deficits / Details: Mostly 4/5 throughout with painful overhead movements making overhead 4-/5 due to pain.  Grip equal bil, sensation intact.  LUE Deficits / Details: Mostly 4/5 throughout with painful overhead  movements making overhead 4-/5 due to pain.  Grip equal bil, sensation intact.   Lower Extremity Assessment: Defer to PT evaluation RLE Deficits / Details: no sensation below T7 not even to painful stimuli, some reflexive-type movements, but otherwise 0/5 ankle, knee and hips bil LLE Deficits / Details: no sensation below T7 not even to painful stimuli, some reflexive-type movements, but otherwise 0/5 ankle, knee and hips bil    ADLs  Overall ADL's : Needs assistance/impaired Lower Body Dressing: Total assistance General ADL Comments: Pt sat EOB for about 4 minutes with +1 mod A to periods of min guard using BUE positioned on bed to provide support.  Mobility  Overal bed mobility: Needs Assistance, +2 for physical assistance Bed Mobility: Rolling, Sidelying to Sit, Sit to Sidelying Rolling: Max assist, +2 for physical assistance Sidelying to sit: Max assist, +2 for physical assistance, HOB elevated Sit to sidelying: Max assist, +2 for physical assistance General bed mobility comments: Able to initiate reaching for rail with UEs with step by step cues; assist with BLEs, bottom and trunk to get to EOB. Anxious.    Transfers  Overall transfer level: Needs assistance Equipment used:  (slide board) Transfers: Lateral/Scoot Transfers  Lateral/Scoot Transfers: With slide board, From elevated surface, +2 physical assistance, Max assist General transfer comment: Declined slide board transfer today due to pain.    Ambulation / Gait / Stairs / Office manager / Balance Dynamic Sitting Balance Sitting balance - Comments: Fearful of falling today during activation of trunk musculature and balance exercises. Practiced lateral reaching to right and left x5 with max A as well as anterior lean to activate lower abdomen.  Balance Overall balance assessment: Needs assistance Sitting-balance support: Feet supported, Bilateral upper extremity supported Sitting balance-Leahy Scale:  Poor Sitting balance - Comments: Fearful of falling today during activation of trunk musculature and balance exercises. Practiced lateral reaching to right and left x5 with max A as well as anterior lean to activate lower abdomen.  Postural control: Posterior lean    Special needs/care consideration BiPAP/CPAP No CPM No Continuous Drip IV No Dialysis No        Life Vest No Oxygen No Special Bed Yes, has an alternate air bed Trach Size No Wound Vac (area) No      Skin Has injury to coccyx and buttocks, wound oozing, scab on right knee.  Has compression stockings in place.                             Bowel mgmt: Last BM 12/23/15 Bladder mgmt: Urinary catheter Diabetic mgmt No Contact Precautions: yes, MRSA    Previous Home Environment Living Arrangements: Spouse/significant other Available Help at Discharge: Family, Available 24 hours/day Type of Home: House Home Layout: One level Home Access: Level entry Bathroom Shower/Tub: Tub/shower unit, Architectural technologist: Handicapped height What Cheer: No  Discharge Living Setting Plans for Discharge Living Setting: Patient's home, House, Lives with (comment) (Lives with wife.) Type of Home at Discharge: House Chiropractor) Discharge Home Layout: One level Discharge Home Access: Level entry Does the patient have any problems obtaining your medications?: No  Social/Family/Support Systems Patient Roles: Spouse, Parent (Has a wife and daughter.) Contact Information: Jake Bathe - wife Anticipated Caregiver: Wife Anticipated Caregiver's Contact Information: Neoma Laming - wife - [phone] Ability/Limitations of Caregiver: Wife works days.  Can check on patient periodically. Caregiver Availability: Intermittent Discharge Plan Discussed with Primary Caregiver: Yes Is Caregiver In Agreement with Plan?: Yes Does Caregiver/Family have Issues with Lodging/Transportation while Pt is in Rehab?: No  Goals/Additional  Needs Patient/Family Goal for Rehab: PT/OT supervision and min assist goals Expected length of stay: 18-22 days Cultural Considerations: None Dietary Needs: Heart diet, thin liquids Equipment Needs: TBD Pt/Family Agrees to Admission and willing to participate: Yes Program Orientation Provided & Reviewed with Pt/Caregiver Including Roles  & Responsibilities: Yes  Decrease burden of Care through IP rehab admission: N/A  Possible need for SNF placement upon discharge: Not planned, but possible need for SNF if family cannot manage at functional level at the time of discharge  Patient Condition: This patient's medical and functional status has changed since the consult dated: 12/19/15 in which the Rehabilitation Physician determined and documented that the patient's condition is appropriate for intensive rehabilitative care in an inpatient rehabilitation facility. See "History of Present Illness" (above) for medical update. Functional changes are:  Currently requiring max assist for bed mobility and total assist for ADLs. Patient's medical and functional status update has been discussed with the Rehabilitation physician and patient remains appropriate for inpatient rehabilitation. Will admit to inpatient rehab today.   Preadmission Screen Completed By:  Retta Diones, 12/24/2015 2:02 PM ______________________________________________________________________   Discussed status with Dr. Posey Pronto on 12/24/15 at 6 and received telephone approval for admission today.  Admission Coordinator:  Retta Diones, time10/24/17/Date1402

## 2015-12-24 NOTE — Interval H&P Note (Signed)
Marcus [name] was admitted today to Inpatient Rehabilitation with the diagnosis of infection causing cord compression/myelopathy..  The patient's history has been reviewed, patient examined, and there is no change in status.  Patient continues to be appropriate for intensive inpatient rehabilitation.  I have reviewed the patient's chart and labs.  Questions were answered to the patient's satisfaction. The PAPE has been reviewed and assessment remains appropriate.  Marcus [name] Marcus [name] 12/24/2015, 9:18 PM

## 2015-12-24 NOTE — H&P (View-Only) (Signed)
Physical Medicine and Rehabilitation Admission H&P    Chief Complaint  Patient presents with  . Failure To Thrive  . Weakness  : HPI: Marcus [name] is a 75 y.o. right handed male with history of urethral stricture, TIA on Plavix, carotid stenosis status post CEA, hypertension, diabetes mellitus, tremor.Per chart review, wife, and daughter patient lives with spouse. One level home. Wife works during the day. Patient works full-time as a used Barrister's clerk.  He presented 12/14/2015 with generalized weakness failure to thrive after recent fall.Patient found to be in atrial fibrillation with RVR.  Initial chest x-ray evaluation revealed community-acquired pneumonia as well as findings of right lower lobe mass, closed fracture of T7 vertebrae. CT of the chest showed rounded masslike consolidation in the medial aspect of the superior segment right lower lobe measuring 4.7 x 4.9 cm new from 07/07/2013. MRI thoracic spine showed abnormal segment and contrast enhancement of T6 and T7 vertebral bodies with associated T7 compression fracturewith associated discitis -osteomyelitis  most likely secondary to direct extension of the process within the right lower lobe. Cranial CT scan negative for acute changes. Follow-up neurosurgery Dr. Annette Stable with review of MRI thoracic spine no plan for surgical intervention at this time. CT-guided biopsy performed 12/18/2015 with results suggesting infection. Radiation oncology consulted in regards to findings of mass plan to begin simulation and radiation treatment.Infectious disease consulted for [name] with osteomyelitis and advised intravenous Vancomycin through 01/05/2016 then began cefazolin 2 g intravenous every 8 hours after vancomycin completed for a total treatment course of 6 weeks to be completed 01/29/2016. However sputum cultures later showed MRSA with cefazolin discontinued and initiation of vancomycin . Echocardiogram without any vegetation. TEE on  12/21/2015 showed no endocarditis..  Cardiology continues to follow for atrial fibrillation with RVR status post conversion normal sinus rhythm after being maintained on Cardizem drip transitioned to by mouth and the addition of Eliquis for anticoagulation in addition to Plavix. Physical and occupational therapy evaluations completed. Patient was admitted for comprehensive rehabilitation program.  ROS Constitutional: Positive for malaise/fatigue. Negative for chills and fever.  HENT: Negative for hearing loss.   Eyes: Negative for blurred vision and double vision.  Respiratory: Positive for cough. Negative for shortness of breath.   Cardiovascular: Negative for chest pain, palpitations and leg swelling.  Gastrointestinal: Positive for constipation. Negative for nausea and vomiting.  Genitourinary: Positive for frequency and urgency.  Musculoskeletal: Positive for back pain and myalgias.  Skin: Negative for rash.  Neurological: Positive for weakness, confusion. Negative for seizures and headaches.  All other systems reviewed and are negative   Past Medical History:  Diagnosis Date  . Anxiety state, unspecified   . Calculus of kidney   . 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 Complication of anesthesia    " I woke up in the recovery room with tube in throat and panicked."  . 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 Herpes zoster without mention of complication   . History of hiatal hernia   . Hypertrophy of prostate with urinary obstruction and other lower urinary tract symptoms (LUTS)   . Insomnia, unspecified   . Lumbago   . Other and unspecified hyperlipidemia   . Pneumonia    bronchial  . Reflux esophagitis   . Seasonal allergies   . Stroke ([place])   . TIA (transient ischemic attack)   . Type II or unspecified type diabetes mellitus without mention of complication, not stated as uncontrolled   .  Unspecified essential hypertension   . Unspecified vitamin D deficiency   .  Urethral stricture unspecified   . Urinary frequency    Past Surgical History:  Procedure Laterality Date  . CARDIAC CATHETERIZATION  1986   normal, Dr Lia Foyer  . ENDARTERECTOMY Left 06/08/2014   Procedure: LEFT CAROTID ARTERY ENDARTERECTOMY;  Surgeon: Rosetta Posner, MD;  Location: [place];  Service: Vascular;  Laterality: Left;  . EYE SURGERY  2014   cataract  . EYE SURGERY  2013   Retina  . PATCH ANGIOPLASTY Left 06/08/2014   Procedure: WITH DACRON PATCH ANGIOPLASTY;  Surgeon: Rosetta Posner, MD;  Location: [place];  Service: Vascular;  Laterality: Left;  . SPINAL FUSION  12/91,11/91   obtained bone from left lower leg  . stimulator 1991     spine  . stress thallium  1994    normal;  . stretching bladder  07/2007   Dr Risa Grill   Family History  Problem Relation Age of Onset  . Diabetes Mother   . Cancer Mother   . Hypertension Father   . Stroke Father   . Alcohol abuse Brother    Social History:  reports that he quit smoking about 24 years ago. His smoking use included Cigarettes. He has never used smokeless tobacco. He reports that he does not drink alcohol or use drugs. Allergies:  Allergies  Allergen Reactions  . Codeine Other (See Comments)    constipation  . Demerol [Meperidine] Other (See Comments)    Drops blood pressure   . Contrast Media [Iodinated Diagnostic Agents] Nausea And Vomiting  . Gabapentin Other (See Comments)    Suicidal, depression  . Other Other (See Comments)    All narcotics make pt hallucinate   Medications Prior to Admission  Medication Sig Dispense Refill  . atorvastatin (LIPITOR) 40 MG tablet TAKE ONE TABLET BY MOUTH ONCE DAILY FOR CHOLESTEROL (Patient taking differently: TAKE ONE TABLET BY MOUTH ONCE DAILY AT BEDTIME FOR CHOLESTEROL) 30 tablet 3  . Cholecalciferol (VITAMIN D3) 5000 units TABS Take 5,000 Units by mouth at bedtime.    . clopidogrel (PLAVIX) 75 MG tablet TAKE 1 TABLET BY MOUTH DAILY 90 tablet 3  . 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 HYDROcodone-acetaminophen (NORCO/VICODIN) 5-325 MG tablet Take 0.5-1 tablets by mouth every 6 (six) hours as needed for moderate pain.     Marland Kitchen LORazepam (ATIVAN) 0.5 MG tablet Take 0.5 mg by mouth See admin instructions. Take 1 tablet (0.5 mg) by mouth every morning, may also take 1 tablet 2 more times during the day as needed for anxiety  0  . nicotine polacrilex (COMMIT) 2 MG lozenge Take 2 mg by mouth every 2 (two) hours.     . ondansetron (ZOFRAN) 4 MG tablet Take 1 tablet (4 mg total) by mouth every 8 (eight) hours as needed for nausea or vomiting. 20 tablet 0  . timolol (BETIMOL) 0.5 % ophthalmic solution Place 1 drop into both eyes 2 (two) times daily.    Marland Kitchen trimethoprim (TRIMPEX) 100 MG tablet Take 100 mg by mouth daily.    Marland Kitchen acetaminophen (TYLENOL) 500 MG tablet Take 1,000 mg by mouth every 6 (six) hours as needed for mild pain or moderate pain.    . methocarbamol (ROBAXIN) 500  MG tablet Take 1 tablet (500 mg total) by mouth every 6 (six) hours as needed for muscle spasms. (Patient not taking: Reported on 12/14/2015) 120 tablet 0  . traMADol (ULTRAM) 50 MG tablet Take 1 tablet (50 mg total) by mouth every 6 (six) hours as needed. (Patient not taking: Reported on 12/14/2015) 15 tablet 0    Home: [place] expects to be discharged to:: Private residence Living Arrangements: Spouse/significant other Available Help at Discharge: Family, Available 24 hours/day Type of Home: House Home Access: Level entry Home Layout: One level Bathroom Shower/Tub: Tub/shower unit, Architectural technologist: Handicapped height Home Equipment: Environmental consultant - 2 wheels   Functional History: Prior Function Level of Independence: Independent Comments: works full time as a used Environmental manager Status:  Mobility: Bed Mobility Overal bed mobility: +2 for physical assistance, Needs Assistance Bed Mobility: Sidelying to Sit Rolling: +2 for physical assistance, Max assist Sidelying to sit: +2 for physical assistance, Max assist, HOB elevated Sit to sidelying: +2 for physical assistance, Max assist General bed mobility comments: Two person max assist, I feel pt was using his arms more today than yesterday to help with transitions and we left HOB a bit higher as well.  Transfers Overall transfer level: Needs assistance Equipment used:  (slide board) Transfers: Lateral/Scoot Transfers  Lateral/Scoot Transfers: With slide board, From elevated surface, +2 physical assistance, Max assist General transfer comment: Two person max assist to slide board transfer into drop arm recliner chair.  Verbal cues to pt for hand placement, one therapist in front one behind helping to control movement of body over slide baord.  Pt was helping some with his arms (right hand reaching and pulling and left arm holding onto front therapist).       ADL: ADL Overall ADL's : Needs assistance/impaired Lower Body Dressing: Total assistance General ADL Comments: Pt sat EOB for about 4 minutes with +1 mod A to periods of min guard using BUE positioned on bed to provide support.    Cognition: Cognition Overall Cognitive Status: Impaired/Different from baseline Orientation Level: Oriented to person, Oriented to place, Disoriented to time, Disoriented to situation Cognition Arousal/Alertness: Awake/alert Behavior During Therapy: WFL for tasks assessed/performed Overall Cognitive Status: Impaired/Different from baseline Area of Impairment: Memory Orientation Level: Time Memory: Decreased short-term memory General Comments: Pt did not remember sitting up with me yesterday.   Physical Exam: Blood pressure 136/75, pulse (!) 56, temperature 97.8 F (36.6 C), temperature source Oral, resp.  rate 17, height 5' 7.5" (1.715 m), weight 98.8 kg (217 lb 12.8 oz), SpO2 97 %. Physical Exam Gen: NAD. Vital signs reviewed. HENT:  Head: Normocephalic. Atraumatic.  Eyes: EOM are normal. No discharge.  Neck: Normal range of motion. Neck supple. No thyromegaly present.  Cardiovascular: Normal rate and regular rhythm.   Respiratory: Effort normal and breath sounds normal. No respiratory distress.  GI: Soft. Bowel sounds are normal. He exhibits no distension.  Neurological: He is alert and oriented x2.  He does show some mild delay in processing.  Follows simple commands.  T6 sensory level. , has minimal LT below level.  Toes up bilaterally.  DTR's 3+ patella/achilles bilaterally.  Motor: B/l UE strength 4+/5.  B/l LE 0/5. Skin: Sacral ulcer not examined Psychiatric: He has a normal mood and affect. His behavior is normal   Results for orders placed or performed during the hospital encounter of 12/14/15 (from the past 48 hour(s))  Glucose, capillary     Status: Abnormal  Collection Time: 12/21/15  1:04 PM  Result Value Ref Range   Glucose-Capillary 128 (H) 65 - 99 mg/dL  Glucose, capillary     Status: Abnormal   Collection Time: 12/21/15  5:00 PM  Result Value Ref Range   Glucose-Capillary 118 (H) 65 - 99 mg/dL  Glucose, capillary     Status: Abnormal   Collection Time: 12/21/15 11:16 PM  Result Value Ref Range   Glucose-Capillary 160 (H) 65 - 99 mg/dL  Glucose, capillary     Status: Abnormal   Collection Time: 12/22/15  4:32 AM  Result Value Ref Range   Glucose-Capillary 120 (H) 65 - 99 mg/dL  Glucose, capillary     Status: Abnormal   Collection Time: 12/22/15 11:31 AM  Result Value Ref Range   Glucose-Capillary 113 (H) 65 - 99 mg/dL  Glucose, capillary     Status: Abnormal   Collection Time: 12/22/15  5:01 PM  Result Value Ref Range   Glucose-Capillary 130 (H) 65 - 99 mg/dL  Glucose, capillary     Status: Abnormal   Collection Time: 12/23/15 12:06 AM  Result Value  Ref Range   Glucose-Capillary 127 (H) 65 - 99 mg/dL  Glucose, capillary     Status: Abnormal   Collection Time: 12/23/15  5:21 AM  Result Value Ref Range   Glucose-Capillary 138 (H) 65 - 99 mg/dL  Creatinine, serum     Status: None   Collection Time: 12/23/15  7:38 AM  Result Value Ref Range   Creatinine, Ser 0.64 0.61 - 1.24 mg/dL   GFR calc non Af Amer >60 >60 mL/min   GFR calc Af Amer >60 >60 mL/min    Comment: (NOTE) The eGFR has been calculated using the CKD EPI equation. This calculation has not been validated in all clinical situations. eGFR's persistently <60 mL/min signify possible Chronic Kidney Disease.    No results found.     Medical Problem List and Plan: 1.  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  2.  DVT Prophylaxis/Anticoagulation: Eliquis 3. Pain Management: Hydrocodone as needed 4. Mood: Ativan 0.5 mg daily and prn 5. Neuropsych: This patient is capable of making decisions on his own behalf. 6. Skin/Wound Care: Follow-up WOC for sacrum and bilateral buttocks ulcers 7. Fluids/Electrolytes/Nutrition: Routine skin checks 8.ID/MSSA/MRSA. Presently on vancomycin through 01/05/2016 and then begin cefazolin 2 g intravenous every 8 hours after vancomycin completed for a total course of 6 weeks to be completed 01/29/2016 9. Neurogenic bowel and bladder. Establish bowel program. Provide full education 10. Atrial fibrillation with RVR. Continue Cardizem 180 mg daily. Cardiac rate controlled. Follow-up cardiology services 11. Hyperlipidemia. Lipitor 12. Diabetes mellitus type 2. Diet controlled. Monitor while on Decadron therapy.   Post Admission Physician Evaluation: 1. Functional deficits secondary  to infection causing cord compression/myelopathy. 2. Patient is admitted to receive collaborative, interdisciplinary care between the physiatrist, rehab nursing staff, and therapy team. 3. Patient's level of medical complexity  and substantial therapy needs in context of that medical necessity cannot be provided at a lesser intensity of care such as a SNF. 4. Patient has experienced substantial functional loss from his/her baseline which was documented above under the "Functional History" and "Functional Status" headings.  Judging by the patient's diagnosis, physical exam, and functional history, the patient has potential for functional progress which will result in measurable gains while on inpatient rehab.  These gains will be of substantial and practical use upon discharge  in facilitating mobility and self-care at  the household level. 5. Physiatrist will provide 24 hour management of medical needs as well as oversight of the therapy plan/treatment and provide guidance as appropriate regarding the interaction of the two. 6. 24 hour rehab nursing will assist with bladder management, bowel management, safety, skin/wound care, disease management, medication administration, pain management and patient education and help integrate therapy concepts, techniques,education, etc. 7. PT will assess and treat for/with: Lower extremity strength, range of motion, stamina, balance, functional mobility, safety, adaptive techniques and equipment, woundcare, coping skills, pain control, SCI education.   Goals are: Mod/Min A. 8. OT will assess and treat for/with: ADL's, functional mobility, safety, upper extremity strength, adaptive techniques and equipment, wound mgt, ego support, and community reintegration.   Goals are: Mod/Min A. Therapy may not proceed with showering this patient. 9. Case Management and Social Worker will assess and treat for psychological issues and discharge planning. 10. Team conference will be held weekly to assess progress toward goals and to determine barriers to discharge. 11. Patient will receive at least 3 hours of therapy per day at least 5 days per week. 12. ELOS: 16-19 days.       13. Prognosis:  good and  fair  Delice Lesch, MD, [facility] 12/23/2015

## 2015-12-24 NOTE — Progress Notes (Signed)
Patient arrived on unit at 1800, assigned to [id number]. Initial vital signs taken.

## 2015-12-24 NOTE — Progress Notes (Signed)
Rehab admissions - I have clearance from attending MD for admission to acute inpatient rehab today.  Bed available and will admit today.  Call me for questions.  RC:[phone]

## 2015-12-25 ENCOUNTER — Ambulatory Visit: Payer: Medicare Other

## 2015-12-25 ENCOUNTER — Inpatient Hospital Stay (HOSPITAL_COMMUNITY): Payer: Medicare Other

## 2015-12-25 ENCOUNTER — Other Ambulatory Visit: Payer: Self-pay

## 2015-12-25 ENCOUNTER — Inpatient Hospital Stay (HOSPITAL_COMMUNITY): Payer: Medicare Other | Admitting: Physical Therapy

## 2015-12-25 ENCOUNTER — Encounter (HOSPITAL_COMMUNITY): Payer: Self-pay | Admitting: Physician Assistant

## 2015-12-25 ENCOUNTER — Inpatient Hospital Stay (HOSPITAL_COMMUNITY): Payer: Medicare Other | Admitting: Occupational Therapy

## 2015-12-25 ENCOUNTER — Inpatient Hospital Stay (HOSPITAL_COMMUNITY)
Admission: AD | Admit: 2015-12-25 | Discharge: 2016-01-10 | DRG: 377 | Disposition: A | Discharge status: Skilled Nursing Facility | Payer: Medicare Other | Source: Ambulatory Visit | Attending: Internal Medicine | Admitting: Internal Medicine

## 2015-12-25 DIAGNOSIS — I82402 Acute embolism and thrombosis of unspecified deep veins of left lower extremity: Secondary | ICD-10-CM

## 2015-12-25 DIAGNOSIS — I4891 Unspecified atrial fibrillation: Secondary | ICD-10-CM | POA: Diagnosis present

## 2015-12-25 DIAGNOSIS — D72819 Decreased white blood cell count, unspecified: Secondary | ICD-10-CM | POA: Diagnosis not present

## 2015-12-25 DIAGNOSIS — R1012 Left upper quadrant pain: Secondary | ICD-10-CM | POA: Diagnosis not present

## 2015-12-25 DIAGNOSIS — Z87442 Personal history of urinary calculi: Secondary | ICD-10-CM

## 2015-12-25 DIAGNOSIS — R2689 Other abnormalities of gait and mobility: Secondary | ICD-10-CM

## 2015-12-25 DIAGNOSIS — J302 Other seasonal allergic rhinitis: Secondary | ICD-10-CM | POA: Diagnosis present

## 2015-12-25 DIAGNOSIS — M4854XG Collapsed vertebra, not elsewhere classified, thoracic region, subsequent encounter for fracture with delayed healing: Secondary | ICD-10-CM | POA: Diagnosis not present

## 2015-12-25 DIAGNOSIS — F411 Generalized anxiety disorder: Secondary | ICD-10-CM | POA: Diagnosis present

## 2015-12-25 DIAGNOSIS — Z8673 Personal history of transient ischemic attack (TIA), and cerebral infarction without residual deficits: Secondary | ICD-10-CM

## 2015-12-25 DIAGNOSIS — E559 Vitamin D deficiency, unspecified: Secondary | ICD-10-CM | POA: Diagnosis present

## 2015-12-25 DIAGNOSIS — N319 Neuromuscular dysfunction of bladder, unspecified: Secondary | ICD-10-CM | POA: Diagnosis present

## 2015-12-25 DIAGNOSIS — J90 Pleural effusion, not elsewhere classified: Secondary | ICD-10-CM | POA: Diagnosis present

## 2015-12-25 DIAGNOSIS — Z885 Allergy status to narcotic agent status: Secondary | ICD-10-CM

## 2015-12-25 DIAGNOSIS — D696 Thrombocytopenia, unspecified: Secondary | ICD-10-CM

## 2015-12-25 DIAGNOSIS — I824Z3 Acute embolism and thrombosis of unspecified deep veins of distal lower extremity, bilateral: Secondary | ICD-10-CM | POA: Diagnosis not present

## 2015-12-25 DIAGNOSIS — I82403 Acute embolism and thrombosis of unspecified deep veins of lower extremity, bilateral: Secondary | ICD-10-CM | POA: Diagnosis present

## 2015-12-25 DIAGNOSIS — D62 Acute posthemorrhagic anemia: Secondary | ICD-10-CM | POA: Diagnosis present

## 2015-12-25 DIAGNOSIS — R0602 Shortness of breath: Secondary | ICD-10-CM

## 2015-12-25 DIAGNOSIS — L89312 Pressure ulcer of right buttock, stage 2: Secondary | ICD-10-CM | POA: Diagnosis present

## 2015-12-25 DIAGNOSIS — M4854XA Collapsed vertebra, not elsewhere classified, thoracic region, initial encounter for fracture: Secondary | ICD-10-CM | POA: Diagnosis present

## 2015-12-25 DIAGNOSIS — Z7401 Bed confinement status: Secondary | ICD-10-CM

## 2015-12-25 DIAGNOSIS — G959 Disease of spinal cord, unspecified: Secondary | ICD-10-CM | POA: Diagnosis present

## 2015-12-25 DIAGNOSIS — K264 Chronic or unspecified duodenal ulcer with hemorrhage: Secondary | ICD-10-CM | POA: Diagnosis present

## 2015-12-25 DIAGNOSIS — J15212 Pneumonia due to Methicillin resistant Staphylococcus aureus: Secondary | ICD-10-CM | POA: Diagnosis present

## 2015-12-25 DIAGNOSIS — I82432 Acute embolism and thrombosis of left popliteal vein: Secondary | ICD-10-CM | POA: Diagnosis not present

## 2015-12-25 DIAGNOSIS — Z515 Encounter for palliative care: Secondary | ICD-10-CM | POA: Diagnosis not present

## 2015-12-25 DIAGNOSIS — A4902 Methicillin resistant Staphylococcus aureus infection, unspecified site: Secondary | ICD-10-CM | POA: Diagnosis present

## 2015-12-25 DIAGNOSIS — D72829 Elevated white blood cell count, unspecified: Secondary | ICD-10-CM

## 2015-12-25 DIAGNOSIS — I480 Paroxysmal atrial fibrillation: Secondary | ICD-10-CM

## 2015-12-25 DIAGNOSIS — Z87891 Personal history of nicotine dependence: Secondary | ICD-10-CM

## 2015-12-25 DIAGNOSIS — Z7901 Long term (current) use of anticoagulants: Secondary | ICD-10-CM | POA: Diagnosis not present

## 2015-12-25 DIAGNOSIS — K921 Melena: Secondary | ICD-10-CM | POA: Diagnosis present

## 2015-12-25 DIAGNOSIS — K592 Neurogenic bowel, not elsewhere classified: Secondary | ICD-10-CM | POA: Diagnosis present

## 2015-12-25 DIAGNOSIS — Z86718 Personal history of other venous thrombosis and embolism: Secondary | ICD-10-CM

## 2015-12-25 DIAGNOSIS — M7989 Other specified soft tissue disorders: Secondary | ICD-10-CM

## 2015-12-25 DIAGNOSIS — I6522 Occlusion and stenosis of left carotid artery: Secondary | ICD-10-CM | POA: Diagnosis present

## 2015-12-25 DIAGNOSIS — M869 Osteomyelitis, unspecified: Secondary | ICD-10-CM | POA: Diagnosis present

## 2015-12-25 DIAGNOSIS — I63232 Cerebral infarction due to unspecified occlusion or stenosis of left carotid arteries: Secondary | ICD-10-CM | POA: Diagnosis present

## 2015-12-25 DIAGNOSIS — L8990 Pressure ulcer of unspecified site, unspecified stage: Secondary | ICD-10-CM | POA: Diagnosis present

## 2015-12-25 DIAGNOSIS — K922 Gastrointestinal hemorrhage, unspecified: Secondary | ICD-10-CM

## 2015-12-25 DIAGNOSIS — Z9889 Other specified postprocedural states: Secondary | ICD-10-CM

## 2015-12-25 DIAGNOSIS — R410 Disorientation, unspecified: Secondary | ICD-10-CM

## 2015-12-25 DIAGNOSIS — A4101 Sepsis due to Methicillin susceptible Staphylococcus aureus: Secondary | ICD-10-CM | POA: Diagnosis not present

## 2015-12-25 DIAGNOSIS — L89152 Pressure ulcer of sacral region, stage 2: Secondary | ICD-10-CM | POA: Diagnosis present

## 2015-12-25 DIAGNOSIS — G4700 Insomnia, unspecified: Secondary | ICD-10-CM | POA: Diagnosis present

## 2015-12-25 DIAGNOSIS — Z888 Allergy status to other drugs, medicaments and biological substances status: Secondary | ICD-10-CM

## 2015-12-25 DIAGNOSIS — E43 Unspecified severe protein-calorie malnutrition: Secondary | ICD-10-CM | POA: Diagnosis present

## 2015-12-25 DIAGNOSIS — B3781 Candidal esophagitis: Secondary | ICD-10-CM | POA: Diagnosis present

## 2015-12-25 DIAGNOSIS — S22069A Unspecified fracture of T7-T8 vertebra, initial encounter for closed fracture: Secondary | ICD-10-CM | POA: Diagnosis present

## 2015-12-25 DIAGNOSIS — R52 Pain, unspecified: Secondary | ICD-10-CM | POA: Diagnosis present

## 2015-12-25 DIAGNOSIS — Z808 Family history of malignant neoplasm of other organs or systems: Secondary | ICD-10-CM | POA: Diagnosis not present

## 2015-12-25 DIAGNOSIS — I2510 Atherosclerotic heart disease of native coronary artery without angina pectoris: Secondary | ICD-10-CM | POA: Diagnosis present

## 2015-12-25 DIAGNOSIS — G8220 Paraplegia, unspecified: Secondary | ICD-10-CM | POA: Diagnosis present

## 2015-12-25 DIAGNOSIS — R29898 Other symptoms and signs involving the musculoskeletal system: Secondary | ICD-10-CM | POA: Diagnosis present

## 2015-12-25 DIAGNOSIS — E785 Hyperlipidemia, unspecified: Secondary | ICD-10-CM | POA: Diagnosis present

## 2015-12-25 DIAGNOSIS — R1013 Epigastric pain: Secondary | ICD-10-CM | POA: Diagnosis present

## 2015-12-25 DIAGNOSIS — D61818 Other pancytopenia: Secondary | ICD-10-CM | POA: Diagnosis present

## 2015-12-25 DIAGNOSIS — E1169 Type 2 diabetes mellitus with other specified complication: Secondary | ICD-10-CM | POA: Diagnosis present

## 2015-12-25 DIAGNOSIS — I824Z2 Acute embolism and thrombosis of unspecified deep veins of left distal lower extremity: Secondary | ICD-10-CM | POA: Diagnosis not present

## 2015-12-25 DIAGNOSIS — Z833 Family history of diabetes mellitus: Secondary | ICD-10-CM | POA: Diagnosis not present

## 2015-12-25 DIAGNOSIS — M4854XS Collapsed vertebra, not elsewhere classified, thoracic region, sequela of fracture: Secondary | ICD-10-CM | POA: Diagnosis not present

## 2015-12-25 DIAGNOSIS — Z7902 Long term (current) use of antithrombotics/antiplatelets: Secondary | ICD-10-CM

## 2015-12-25 DIAGNOSIS — Z823 Family history of stroke: Secondary | ICD-10-CM | POA: Diagnosis not present

## 2015-12-25 DIAGNOSIS — S22069D Unspecified fracture of T7-T8 vertebra, subsequent encounter for fracture with routine healing: Secondary | ICD-10-CM | POA: Diagnosis not present

## 2015-12-25 DIAGNOSIS — R269 Unspecified abnormalities of gait and mobility: Secondary | ICD-10-CM | POA: Diagnosis present

## 2015-12-25 DIAGNOSIS — I10 Essential (primary) hypertension: Secondary | ICD-10-CM | POA: Diagnosis present

## 2015-12-25 DIAGNOSIS — B9562 Methicillin resistant Staphylococcus aureus infection as the cause of diseases classified elsewhere: Secondary | ICD-10-CM | POA: Diagnosis present

## 2015-12-25 DIAGNOSIS — L0291 Cutaneous abscess, unspecified: Secondary | ICD-10-CM

## 2015-12-25 DIAGNOSIS — G729 Myopathy, unspecified: Secondary | ICD-10-CM | POA: Diagnosis not present

## 2015-12-25 DIAGNOSIS — F05 Delirium due to known physiological condition: Secondary | ICD-10-CM | POA: Diagnosis present

## 2015-12-25 DIAGNOSIS — R339 Retention of urine, unspecified: Secondary | ICD-10-CM | POA: Diagnosis present

## 2015-12-25 DIAGNOSIS — Z66 Do not resuscitate: Secondary | ICD-10-CM | POA: Diagnosis present

## 2015-12-25 DIAGNOSIS — Z79899 Other long term (current) drug therapy: Secondary | ICD-10-CM | POA: Diagnosis not present

## 2015-12-25 DIAGNOSIS — R1011 Right upper quadrant pain: Secondary | ICD-10-CM | POA: Diagnosis not present

## 2015-12-25 DIAGNOSIS — M4640 Discitis, unspecified, site unspecified: Secondary | ICD-10-CM | POA: Diagnosis present

## 2015-12-25 DIAGNOSIS — L89322 Pressure ulcer of left buttock, stage 2: Secondary | ICD-10-CM | POA: Diagnosis present

## 2015-12-25 DIAGNOSIS — Z8249 Family history of ischemic heart disease and other diseases of the circulatory system: Secondary | ICD-10-CM | POA: Diagnosis not present

## 2015-12-25 DIAGNOSIS — S22069G Unspecified fracture of T7-T8 vertebra, subsequent encounter for fracture with delayed healing: Secondary | ICD-10-CM | POA: Diagnosis not present

## 2015-12-25 DIAGNOSIS — I82409 Acute embolism and thrombosis of unspecified deep veins of unspecified lower extremity: Secondary | ICD-10-CM | POA: Diagnosis not present

## 2015-12-25 LAB — CBC WITH DIFFERENTIAL/PLATELET
Basophils Absolute: 0 10*3/uL (ref 0.0–0.1)
Basophils Relative: 0 %
EOS ABS: 0 10*3/uL (ref 0.0–0.7)
EOS PCT: 0 %
HCT: 23.8 % — ABNORMAL LOW (ref 39.0–52.0)
HEMOGLOBIN: 7.7 g/dL — AB (ref 13.0–17.0)
LYMPHS ABS: 0.3 10*3/uL — AB (ref 0.7–4.0)
LYMPHS PCT: 3 %
MCH: 30.3 pg (ref 26.0–34.0)
MCHC: 32.4 g/dL (ref 30.0–36.0)
MCV: 93.7 fL (ref 78.0–100.0)
MONOS PCT: 1 %
Monocytes Absolute: 0.1 10*3/uL (ref 0.1–1.0)
Neutro Abs: 8.1 10*3/uL — ABNORMAL HIGH (ref 1.7–7.7)
Neutrophils Relative %: 96 %
PLATELETS: 302 10*3/uL (ref 150–400)
RBC: 2.54 MIL/uL — AB (ref 4.22–5.81)
RDW: 15.1 % (ref 11.5–15.5)
WBC: 8.4 10*3/uL (ref 4.0–10.5)

## 2015-12-25 LAB — HEMOGLOBIN AND HEMATOCRIT, BLOOD
HCT: 26.8 % — ABNORMAL LOW (ref 39.0–52.0)
Hemoglobin: 9.1 g/dL — ABNORMAL LOW (ref 13.0–17.0)

## 2015-12-25 LAB — COMPREHENSIVE METABOLIC PANEL
ALK PHOS: 67 U/L (ref 38–126)
ALT: 20 U/L (ref 17–63)
ANION GAP: 7 (ref 5–15)
AST: 29 U/L (ref 15–41)
Albumin: 1.7 g/dL — ABNORMAL LOW (ref 3.5–5.0)
BUN: 55 mg/dL — ABNORMAL HIGH (ref 6–20)
CALCIUM: 7.2 mg/dL — AB (ref 8.9–10.3)
CO2: 25 mmol/L (ref 22–32)
CREATININE: 0.75 mg/dL (ref 0.61–1.24)
Chloride: 108 mmol/L (ref 101–111)
Glucose, Bld: 132 mg/dL — ABNORMAL HIGH (ref 65–99)
Potassium: 4.8 mmol/L (ref 3.5–5.1)
Sodium: 140 mmol/L (ref 135–145)
Total Bilirubin: 0.6 mg/dL (ref 0.3–1.2)
Total Protein: 4.3 g/dL — ABNORMAL LOW (ref 6.5–8.1)

## 2015-12-25 LAB — PREPARE RBC (CROSSMATCH)

## 2015-12-25 LAB — CBC
HCT: 22.7 % — ABNORMAL LOW (ref 39.0–52.0)
HEMOGLOBIN: 7.4 g/dL — AB (ref 13.0–17.0)
MCH: 30.5 pg (ref 26.0–34.0)
MCHC: 32.6 g/dL (ref 30.0–36.0)
MCV: 93.4 fL (ref 78.0–100.0)
PLATELETS: 278 10*3/uL (ref 150–400)
RBC: 2.43 MIL/uL — AB (ref 4.22–5.81)
RDW: 15 % (ref 11.5–15.5)
WBC: 6.8 10*3/uL (ref 4.0–10.5)

## 2015-12-25 LAB — GLUCOSE, CAPILLARY
GLUCOSE-CAPILLARY: 140 mg/dL — AB (ref 65–99)
GLUCOSE-CAPILLARY: 135 mg/dL — AB (ref 65–99)
Glucose-Capillary: 132 mg/dL — ABNORMAL HIGH (ref 65–99)
Glucose-Capillary: 137 mg/dL — ABNORMAL HIGH (ref 65–99)

## 2015-12-25 MED ORDER — PROMETHAZINE HCL 12.5 MG PO TABS
25.0000 mg | ORAL_TABLET | Freq: Four times a day (QID) | ORAL | Status: DC | PRN
  Filled 2015-12-25: qty 2

## 2015-12-25 MED ORDER — ENSURE ENLIVE PO LIQD
237.0000 mL | Freq: Two times a day (BID) | ORAL | Status: DC
  Administered 2015-12-26: 237 mL via ORAL

## 2015-12-25 MED ORDER — NICOTINE POLACRILEX 2 MG MT LOZG
2.0000 mg | LOZENGE | OROMUCOSAL | Status: DC
  Administered 2015-12-25 – 2015-12-28 (×6): 2 mg via ORAL
  Filled 2015-12-25 (×91): qty 1

## 2015-12-25 MED ORDER — LORAZEPAM 0.5 MG PO TABS: 0.5000 mg | ORAL_TABLET | Freq: Every morning | ORAL | Status: DC

## 2015-12-25 MED ORDER — TIMOLOL MALEATE 0.5 % OP SOLN
1.0000 [drp] | Freq: Two times a day (BID) | OPHTHALMIC | Status: DC
  Administered 2015-12-25 – 2016-01-09 (×29): 1 [drp] via OPHTHALMIC
  Filled 2015-12-25: qty 5

## 2015-12-25 MED ORDER — LORAZEPAM 0.5 MG PO TABS: 0.5000 mg | ORAL_TABLET | ORAL | Status: DC

## 2015-12-25 MED ORDER — PANTOPRAZOLE SODIUM 40 MG PO TBEC
40.0000 mg | DELAYED_RELEASE_TABLET | Freq: Every day | ORAL | Status: DC
  Administered 2015-12-25: 40 mg via ORAL
  Filled 2015-12-25: qty 1

## 2015-12-25 MED ORDER — ATORVASTATIN CALCIUM 40 MG PO TABS
40.0000 mg | ORAL_TABLET | Freq: Every day | ORAL | Status: DC
  Filled 2015-12-25: qty 1

## 2015-12-25 MED ORDER — LORAZEPAM 0.5 MG PO TABS: 0.5000 mg | ORAL_TABLET | Freq: Two times a day (BID) | ORAL | Status: DC | PRN

## 2015-12-25 MED ORDER — ACETAMINOPHEN 500 MG PO TABS
1000.0000 mg | ORAL_TABLET | Freq: Four times a day (QID) | ORAL | Status: DC | PRN
  Filled 2015-12-25 (×2): qty 2

## 2015-12-25 MED ORDER — ACETAMINOPHEN 650 MG RE SUPP
650.0000 mg | Freq: Four times a day (QID) | RECTAL | Status: DC | PRN
  Filled 2015-12-25: qty 1

## 2015-12-25 MED ORDER — ONDANSETRON HCL 4 MG PO TABS: 4.0000 mg | ORAL_TABLET | Freq: Four times a day (QID) | ORAL | Status: DC | PRN

## 2015-12-25 MED ORDER — SODIUM CHLORIDE 0.9 % IV SOLN
80.0000 mg | Freq: Once | INTRAVENOUS | Status: AC
  Administered 2015-12-25: 80 mg via INTRAVENOUS
  Filled 2015-12-25: qty 80

## 2015-12-25 MED ORDER — DEXAMETHASONE SODIUM PHOSPHATE 10 MG/ML IJ SOLN
10.0000 mg | Freq: Four times a day (QID) | INTRAMUSCULAR | Status: DC
  Administered 2015-12-26: 10 mg via INTRAVENOUS
  Filled 2015-12-25 (×2): qty 1

## 2015-12-25 MED ORDER — VANCOMYCIN HCL 10 G IV SOLR: 1250.0000 mg | Freq: Two times a day (BID) | INTRAVENOUS | Status: DC

## 2015-12-25 MED ORDER — FENTANYL CITRATE (PF) 100 MCG/2ML IJ SOLN
12.5000 ug | INTRAMUSCULAR | Status: DC | PRN
  Administered 2015-12-25: 12.5 ug via INTRAVENOUS
  Administered 2015-12-26 – 2015-12-28 (×11): 25 ug via INTRAVENOUS
  Administered 2015-12-28: 12.5 ug via INTRAVENOUS
  Administered 2015-12-28 – 2015-12-29 (×2): 25 ug via INTRAVENOUS
  Administered 2015-12-29 (×3): 12.5 ug via INTRAVENOUS
  Administered 2015-12-29 – 2015-12-30 (×5): 25 ug via INTRAVENOUS
  Filled 2015-12-25 (×23): qty 2

## 2015-12-25 MED ORDER — TRAZODONE HCL 50 MG PO TABS: 50.0000 mg | ORAL_TABLET | Freq: Every evening | ORAL | Status: DC | PRN

## 2015-12-25 MED ORDER — ACETAMINOPHEN 325 MG PO TABS
650.0000 mg | ORAL_TABLET | Freq: Once | ORAL | Status: AC
  Administered 2015-12-25: 650 mg via ORAL
  Filled 2015-12-25: qty 2

## 2015-12-25 MED ORDER — CALCITONIN (SALMON) 200 UNIT/ACT NA SOLN
1.0000 | Freq: Every day | NASAL | Status: DC
  Administered 2015-12-28 – 2016-01-06 (×2): 1 via NASAL
  Filled 2015-12-25 (×2): qty 3.7

## 2015-12-25 MED ORDER — INSULIN ASPART 100 UNIT/ML ~~LOC~~ SOLN
0.0000 [IU] | Freq: Three times a day (TID) | SUBCUTANEOUS | Status: DC
  Administered 2015-12-26 (×2): 2 [IU] via SUBCUTANEOUS
  Administered 2015-12-26: 1 [IU] via SUBCUTANEOUS
  Administered 2015-12-28 (×2): 2 [IU] via SUBCUTANEOUS
  Administered 2015-12-29 (×2): 1 [IU] via SUBCUTANEOUS
  Administered 2015-12-30: 3 [IU] via SUBCUTANEOUS
  Administered 2015-12-30 – 2015-12-31 (×3): 1 [IU] via SUBCUTANEOUS
  Administered 2016-01-03: 2 [IU] via SUBCUTANEOUS
  Administered 2016-01-04 – 2016-01-06 (×3): 1 [IU] via SUBCUTANEOUS

## 2015-12-25 MED ORDER — PROMETHAZINE HCL 25 MG/ML IJ SOLN
12.5000 mg | Freq: Four times a day (QID) | INTRAMUSCULAR | Status: DC | PRN
  Administered 2015-12-26 – 2016-01-08 (×3): 12.5 mg via INTRAVENOUS
  Filled 2015-12-25 (×3): qty 1

## 2015-12-25 MED ORDER — TIMOLOL HEMIHYDRATE 0.5 % OP SOLN: 1.0000 [drp] | Freq: Two times a day (BID) | OPHTHALMIC | Status: DC

## 2015-12-25 MED ORDER — DILTIAZEM HCL ER COATED BEADS 180 MG PO CP24
180.0000 mg | ORAL_CAPSULE | Freq: Every day | ORAL | Status: DC
  Filled 2015-12-25: qty 1

## 2015-12-25 MED ORDER — FENTANYL CITRATE (PF) 100 MCG/2ML IJ SOLN: 12.5000 ug | INTRAMUSCULAR | Status: DC | PRN

## 2015-12-25 MED ORDER — INSULIN ASPART 100 UNIT/ML ~~LOC~~ SOLN: 0.0000 [IU] | Freq: Every day | SUBCUTANEOUS | Status: DC

## 2015-12-25 MED ORDER — METHOCARBAMOL 1000 MG/10ML IJ SOLN: 1000.0000 mg | Freq: Three times a day (TID) | INTRAVENOUS | Status: DC | PRN

## 2015-12-25 MED ORDER — DULOXETINE HCL 30 MG PO CPEP
30.0000 mg | ORAL_CAPSULE | Freq: Every day | ORAL | Status: DC
  Administered 2015-12-25: 30 mg via ORAL
  Filled 2015-12-25: qty 1

## 2015-12-25 MED ORDER — FUROSEMIDE 10 MG/ML IJ SOLN
20.0000 mg | Freq: Once | INTRAMUSCULAR | Status: AC
  Administered 2015-12-25: 20 mg via INTRAVENOUS
  Filled 2015-12-25: qty 2

## 2015-12-25 MED ORDER — POLYETHYLENE GLYCOL 3350 17 G PO PACK
17.0000 g | PACK | Freq: Every day | ORAL | Status: DC | PRN
  Administered 2016-01-04: 17 g via ORAL
  Filled 2015-12-25: qty 1

## 2015-12-25 MED ORDER — ATORVASTATIN CALCIUM 40 MG PO TABS
40.0000 mg | ORAL_TABLET | Freq: Every day | ORAL | Status: DC
  Administered 2015-12-25: 40 mg via ORAL
  Filled 2015-12-25: qty 1

## 2015-12-25 MED ORDER — SENNOSIDES-DOCUSATE SODIUM 8.6-50 MG PO TABS
1.0000 | ORAL_TABLET | Freq: Two times a day (BID) | ORAL | Status: DC | PRN
  Administered 2016-01-04: 1 via ORAL
  Filled 2015-12-25: qty 1

## 2015-12-25 MED ORDER — SODIUM CHLORIDE 0.45 % IV SOLN: INTRAVENOUS | Status: DC

## 2015-12-25 MED ORDER — PROMETHAZINE HCL 12.5 MG RE SUPP: 25.0000 mg | Freq: Four times a day (QID) | RECTAL | Status: DC | PRN

## 2015-12-25 MED ORDER — SODIUM CHLORIDE 0.9% FLUSH
3.0000 mL | Freq: Two times a day (BID) | INTRAVENOUS | Status: DC
  Administered 2015-12-25 – 2016-01-08 (×14): 3 mL via INTRAVENOUS

## 2015-12-25 MED ORDER — VANCOMYCIN HCL 10 G IV SOLR
1250.0000 mg | Freq: Two times a day (BID) | INTRAVENOUS | Status: DC
  Administered 2015-12-25: 1250 mg via INTRAVENOUS
  Filled 2015-12-25 (×2): qty 1250

## 2015-12-25 MED ORDER — SODIUM CHLORIDE 0.9 % IV SOLN: Freq: Once | INTRAVENOUS | Status: DC

## 2015-12-25 MED ORDER — SODIUM CHLORIDE 0.9 % IV SOLN
8.0000 mg/h | INTRAVENOUS | Status: DC
  Filled 2015-12-25 (×2): qty 80

## 2015-12-25 MED ORDER — SODIUM CHLORIDE 0.9 % IV SOLN: 4.0000 mg | Freq: Four times a day (QID) | INTRAVENOUS | Status: DC | PRN

## 2015-12-25 MED ORDER — DULOXETINE HCL 30 MG PO CPEP
30.0000 mg | ORAL_CAPSULE | Freq: Every day | ORAL | Status: DC
  Filled 2015-12-25: qty 1

## 2015-12-25 MED ORDER — SODIUM CHLORIDE 0.9 % IV SOLN
Freq: Once | INTRAVENOUS | Status: AC
  Administered 2015-12-25: 13:00:00 via INTRAVENOUS

## 2015-12-25 NOTE — Consult Note (Addendum)
WOC consult requested for sacrum/buttocks wounds.  This was already performed on 10/23; please refer to progress notes for assessment and plan of care.  Topical treatment orders have been provided for staff nurse use. It would be helpful to limit time in the chair to 2 hours or less with each therapy session to avoid prolonged pressure over the deep tissue injury site. Please re-consult if further assistance is needed.  Thank-you,  Julien Girt MSN, [place], [place], [place], [place]

## 2015-12-25 NOTE — Consult Note (Signed)
[facility] Consult: 9:59 AM 12/25/2015  LOS: 1 day    Referring Provider: Dr  Naaman Plummer [facility] Primary Care Physician:  Jeanmarie Hubert, MD Primary Gastroenterologist:  unassigned     Reason for Consultation:  Anemia.  Melenic stool.   HPI: Marcus [name] is a 75 y.o. male.  PMH DM. GERD. TIA.   Carotid stenosis, status post CEA. Anxiety. Admitted to inpatient bed 12/14/2015 through 12/02/2015 with new onset lower extremity weakness and new onset A. fib with RVR. Workup revealed closed T7 fracture possibly lytic versus infectious. Biopsy of right lung mass revealed inflammation/fibrosis favoring inflammatory process but malignancy not entirely ruled out.   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  Has MRSA growing from sputum cultures. 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 Patient had been on chronic Plavix prior to arrival, to this was added Eliquis due to the new A. Fib.  12/21/15 TEE did not reveal any endocarditis. Echocardiogram Patient transferred to rehabilitation yesterday. On routine follow-up lab work hemoglobin measures 7.7, this compares with a range of anywhere from 10.3-11.5 within the last week. The hemoglobin was repeated this morning and it is 7.4.  His BUN has gone from the low 30s up to 55.  On top of this, Doppler studies this morning reveal a DVT in the lower extremity. Patient has never had endoscopic/colonoscopic workup. He denies abdominal pain, denies nausea vomiting. No complaint of heartburn.  No reports of melenic or bloody stools. His wife says he's had at least once daily bowel movements during hospitalization.   Past Medical History:  Diagnosis Date  . Anxiety  state, unspecified   . Atrial fibrillation with RVR ([place]) 12/14/2015  . 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 Complication of anesthesia    " I woke up in the recovery room with tube in throat and panicked."  . 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 Herpes zoster without mention of complication   . History of hiatal hernia   . Hypertrophy of prostate with urinary obstruction and other lower urinary tract symptoms (LUTS)   . Insomnia, unspecified   . Lumbago   . MSSA (methicillin susceptible Staphylococcus aureus) septicemia ([place]) 12/23/2015  . Other and unspecified hyperlipidemia   . 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 Pneumonia    bronchial  . Reflux esophagitis   . Seasonal allergies   . Stroke ([place])   . TIA (transient ischemic attack)   . Type II or unspecified type diabetes mellitus without mention of complication, not stated as uncontrolled   . Unspecified essential hypertension   . Unspecified vitamin D deficiency   . Urethral stricture unspecified   . Urinary frequency     Past Surgical History:  Procedure Laterality Date  . CARDIAC CATHETERIZATION  1986   normal, Dr Lia Foyer  . ENDARTERECTOMY Left 06/08/2014   Procedure: LEFT CAROTID ARTERY ENDARTERECTOMY;  Surgeon: Rosetta Posner, MD;  Location: [place];  Service: Vascular;  Laterality: Left;  . EYE SURGERY  2014   cataract  . EYE SURGERY  2013   Retina  . PATCH ANGIOPLASTY Left 06/08/2014   Procedure: WITH DACRON PATCH ANGIOPLASTY;  Surgeon: Rosetta Posner, MD;  Location: [place];  Service: Vascular;  Laterality: Left;  . SPINAL FUSION  12/91,11/91   obtained bone from left lower leg  . stimulator 1991     spine  . stress thallium  1994    normal;  . stretching bladder  07/2007   Dr Risa Grill  . TEE WITHOUT CARDIOVERSION N/A 12/20/2015   Procedure: TRANSESOPHAGEAL ECHOCARDIOGRAM (TEE);  Surgeon: Dorothy Spark, MD;  Location: [place];  Service: Cardiovascular;  Laterality: N/A;    Prior to  Admission medications   Medication Sig Start Date End Date Taking? Authorizing Provider  apixaban (ELIQUIS) 5 MG TABS tablet Take 1 tablet (5 mg total) by mouth 2 (two) times daily. 12/24/15  Yes Venetia Maxon Rama, MD  atorvastatin (LIPITOR) 40 MG tablet TAKE ONE TABLET BY MOUTH ONCE DAILY FOR CHOLESTEROL Patient taking differently: TAKE ONE TABLET BY MOUTH ONCE DAILY AT BEDTIME FOR CHOLESTEROL 11/20/15  Yes Estill Dooms, MD  Cholecalciferol (VITAMIN D3) 5000 units TABS Take 5,000 Units by mouth at bedtime.   Yes Historical Provider, MD  clopidogrel (PLAVIX) 75 MG tablet TAKE 1 TABLET BY MOUTH DAILY 07/04/15  Yes Rosetta Posner, MD  dexamethasone (DECADRON) 10 MG/ML injection Inject 1 mL (10 mg total) into the vein every 6 (six) hours. 12/24/15  Yes Venetia Maxon Rama, MD  diltiazem (CARDIZEM CD) 180 MG 24 hr capsule Take 1 capsule (180 mg total) by mouth daily. 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 12/24/15  Yes Venetia Maxon Rama, MD  HYDROcodone-acetaminophen (NORCO/VICODIN) 5-325 MG tablet Take 0.5-1 tablets by mouth every 6 (six) hours as needed for moderate pain.    Yes Historical Provider, MD  LORazepam (ATIVAN) 0.5 MG tablet Take 0.5 mg by mouth See admin instructions. Take 1 tablet (0.5 mg) by mouth every morning, may also take 1 tablet 2 more times during the day as needed for anxiety 11/21/15  Yes Historical Provider, MD  nicotine polacrilex (COMMIT) 2 MG lozenge Take 2 mg by mouth every 2 (two) hours.    Yes Historical Provider, MD  ondansetron (ZOFRAN) 4 MG tablet Take 1 tablet (4 mg total) by mouth every 8  (eight) hours as needed for nausea or vomiting. 12/02/15  Yes Lauree Chandler, NP  polyethylene glycol (MIRALAX / GLYCOLAX) packet Take 17 g by mouth daily. 12/25/15  Yes Christina P Rama, MD  senna-docusate (SENOKOT-S) 8.6-50 MG tablet Take 1 tablet by mouth 2 (two) times daily. 12/24/15  Yes Christina P Rama, MD  timolol (BETIMOL) 0.5 % ophthalmic solution Place 1 drop into both eyes 2 (two) times daily.   Yes Historical Provider, MD  vancomycin 1,250 mg in sodium chloride 0.9 % 250 mL Inject 1,250 mg into the vein every 12 (twelve) hours. 12/24/15  Yes Venetia Maxon Rama, MD    Scheduled Meds: . sodium chloride   Intravenous  Once  . sodium chloride   Intravenous Once  . atorvastatin  40 mg Oral q1800  . [START ON 01/06/2016]  ceFAZolin (ANCEF) IV  2 g Intravenous Q8H  . cholecalciferol  5,000 Units Oral QHS  . clopidogrel  75 mg Oral Daily  . dexamethasone  10 mg Oral Q6H  . diltiazem  180 mg Oral Daily  . DULoxetine  30 mg Oral Q supper  . insulin aspart  0-9 Units Subcutaneous Q6H  . LORazepam  0.5 mg Oral Daily  . pantoprazole  40 mg Oral Daily  . polyethylene glycol  17 g Oral Daily  . senna-docusate  1 tablet Oral BID  . timolol  1 drop Both Eyes BID  . vancomycin  1,250 mg Intravenous Q12H   Infusions:   PRN Meds: acetaminophen, HYDROcodone-acetaminophen, Influenza vac split quadrivalent PF, ipratropium-albuterol, LORazepam, ondansetron **OR** ondansetron (ZOFRAN) IV, sodium chloride flush, sorbitol   Allergies as of 12/24/2015 - Review Complete 12/24/2015  Allergen Reaction Noted  . Codeine Other (See Comments) 09/27/2013  . Demerol [meperidine] Other (See Comments) 07/19/2012  . Contrast media [iodinated diagnostic agents] Nausea And Vomiting 12/14/2015  . Gabapentin Other (See Comments) 12/14/2015  . Other Other (See Comments) 12/14/2015    Family History  Problem Relation Age of Onset  . Diabetes Mother   . Cancer Mother   . Hypertension Father   . Stroke  Father   . Alcohol abuse Brother     Social History   Social History  . Marital status: Married    Spouse name: N/A  . Number of children: N/A  . Years of education: N/A   Occupational History  . Not on file.   Social History Main Topics  . Smoking status: Former Smoker    Types: Cigarettes    Quit date: 01/01/1991  . Smokeless tobacco: Never Used     Comment: " Quit around 1986"  . Alcohol use No  . Drug use: No  . Sexual activity: Yes    Birth control/ protection: None   Other Topics Concern  . Not on file   Social History Narrative  . No narrative on file    REVIEW OF SYSTEMS: Constitutional:  Unable to ambulate due to paraplegia of both lower extremities. ENT:  No nose bleeds Pulm:  No shortness of breath. No cough. CV:  No palpitations, no LE edema.  GU:  No hematuria, no frequency GI:  See history of present illness. Heme:  No history of anemia or iron supplementation requirements. No history of unusual bleeding or bruising.   Transfusions:  None in past. However today blood has been ordered. Neuro:  No headaches, no peripheral tingling or numbness Derm:  No itching, no rash or sores.  Endocrine:  No sweats or chills.  No polyuria or dysuria Immunization:  Did not inquire Travel:  None beyond local counties in last few months.    PHYSICAL EXAM: Vital signs in last 24 hours: Vitals:   12/25/15 0425 12/25/15 0849  BP: 136/62 138/68  Pulse: 67 66  Resp: 18 18  Temp: 97.7 F (36.5 C)    Wt Readings from Last 3 Encounters:  12/25/15 87 kg (191 lb 12.8 oz)  12/24/15 93.1 kg (205 lb 4.8 oz)  12/01/15 93.4 kg (206 lb)    General: Anxious, ill appearing WM. Head:  No signs of head trauma. No facial edema.  Eyes:  No icterus, no conjunctival pallor. Ears:  Hearing intact.  Nose:  No discharge or dried  blood. Mouth:  Moist, clear, no blood. Neck:  No mass. No JVD. Lungs:  No labored breathing or cough. Clear bilaterally. Heart: regular, no  appreciable murmur Abdomen:  Soft. Not tender or distended. Bowel sounds normal but slightly hypoactive..  Some bruising in the lower abdomen this is in a mottled-type pattern. Rectal: No digital exam performed. One the diaper was removed there was a large pool of melenic stool at the groin. This tests FOBT positive.   Extremities:  No lower extremity edema  MS:  Spine hurts when his head/upper body was lowered on the bed. Neurologic:  He is able to move his upper extremities but not his lower extremities Skin:  No rashes or sores Psych:  Anxious.  Intake/Output from previous day: 10/24 0701 - 10/25 0700 In: 300 [P.O.:300] Out: 900 [Urine:900] Intake/Output this shift: No intake/output data recorded.  LAB RESULTS:  Recent Labs  12/25/15 0511 12/25/15 0700  WBC 8.4 6.8  HGB 7.7* 7.4*  HCT 23.8* 22.7*  PLT 302 278   BMET Lab Results  Component Value Date   NA 140 12/25/2015   NA 135 12/21/2015   NA 139 12/19/2015   K 4.8 12/25/2015   K 4.7 12/21/2015   K 4.4 12/19/2015   CL 108 12/25/2015   CL 102 12/21/2015   CL 107 12/19/2015   CO2 25 12/25/2015   CO2 27 12/21/2015   CO2 26 12/19/2015   GLUCOSE 132 (H) 12/25/2015   GLUCOSE 138 (H) 12/21/2015   GLUCOSE 145 (H) 12/19/2015   BUN 55 (H) 12/25/2015   BUN 31 (H) 12/21/2015   BUN 33 (H) 12/19/2015   CREATININE 0.75 12/25/2015   CREATININE 0.64 12/23/2015   CREATININE 0.70 12/21/2015   CALCIUM 7.2 (L) 12/25/2015   CALCIUM 7.9 (L) 12/21/2015   CALCIUM 8.2 (L) 12/19/2015   LFT  Recent Labs  12/25/15 0511  PROT 4.3*  ALBUMIN 1.7*  AST 29  ALT 20  ALKPHOS 67  BILITOT 0.6   PT/INR Lab Results  Component Value Date   INR 1.04 12/14/2015   INR 1.17 06/01/2014   Hepatitis Panel No results for input(s): HEPBSAG, HCVAB, HEPAIGM, HEPBIGM in the last 72 hours. C-Diff No components found for: CDIFF Lipase     Component Value Date/Time   LIPASE 19 12/01/2015 1820    Drugs of Abuse     Component Value  Date/Time   LABOPIA NONE DETECTED 06/02/2014 0544   COCAINSCRNUR NONE DETECTED 06/02/2014 0544   LABBENZ NONE DETECTED 06/02/2014 0544   AMPHETMU NONE DETECTED 06/02/2014 0544   THCU NONE DETECTED 06/02/2014 0544   LABBARB NONE DETECTED 06/02/2014 0544     RADIOLOGY STUDIES: No results found.   IMPRESSION:   *  Upper GI bleed in patient who is receiving both Eliquis, for new onset A. fib, and Plavix for history of TIA, cerebrovascular disease.  *  ABL anemia.  PRBC x 2 ordered per Rehab team.   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  Cause of fracture felt to be infectious , malignancy not entirely ruled out..  *  DVT left calf. This was just discovered on studies this morning.  *  Right lung mass. A fib Carotid artery atherosclerosis PLAN:     *  Ideally Plavix as well as Eliquis would be discontinued. Not comfortable making these orders. Think we need to involve the cardiology to assist in decision making. Agree with supporting him with transfusions for now. I think will  need to be transferred back to the medical side of the hospital. Will need an EGD, timing of this will be determined by Dr. Loletha Carrow. As a caution, will place him on clear liquid diet. Will start IV Protonix, in this case a drip seems indicated.  Pharmacist will order this for this since his Restrictive Dosing.  PRBCs ordered.     Azucena Freed  12/25/2015, 9:59 AM Pager: [phone]   I have reviewed the entire case in detail with the above APP and discussed the plan in detail.  Therefore, I agree with the diagnoses recorded above. In addition,  I have personally interviewed and examined the patient and have personally reviewed any abdominal/pelvic CT scan images.  My additional thoughts are as follows:  GI bleed over last 24-36 hrs.  Most likely Upper source exacerbated by plavix and eliquis, which are now on hold.  Thanks to cardiology for advice. PPI drip started.   Serial H/H will be obtained and Tfx as needed after these 2 units.  Tentatively scheduling EGD day after tomorrow to allow time for [facility] to be cleared. He and wife updated, all questions answered.    Nelida Meuse III Pager [phone]  Mon-Fri 8a-5p [phone] after 5p, weekends, holidays

## 2015-12-25 NOTE — Evaluation (Signed)
Physical Therapy Assessment and Plan  Patient Details  Name: Marcus [name] MRN: [id number] Date of Birth: 10-16-1940  PT Diagnosis: Impaired sensation, Muscle weakness and Paraplegia Rehab Potential: Good ELOS: 3-4 weeks   Today's Date: 12/25/2015 PT Individual Time: 0900-0915 PT Individual Time Calculation (min): 15 min     Problem List:  Patient Active Problem List   Diagnosis Date Noted  . 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 Paraplegia ([place]) 12/24/2015  . Neurogenic bowel   . Neurogenic bladder   . Hyperlipidemia   . 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 MRSA infection 12/23/2015  . MSSA (methicillin susceptible Staphylococcus aureus) septicemia ([place]) 12/23/2015  . Pressure injury of skin 12/19/2015  . Closed fracture of seventh thoracic vertebra ([place])   . T7 vertebral fracture ([place])   . Decreased sensation   . Atrial fibrillation with RVR ([place]) 12/14/2015  . Fall 12/14/2015  . Community acquired pneumonia of left lung 12/14/2015  . Compression fracture of vertebra ([place]) 12/14/2015  . Urinary retention 12/14/2015  . New onset a-fib ([place]) 12/14/2015  . Lung mass 12/14/2015  . Lower extremity weakness 12/14/2015  . SOB (shortness of breath) 11/12/2015  . Chest pain 11/12/2015  . S/P carotid endarterectomy 04/10/2015  . 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 Glaucoma 03/20/2015  . Diabetes ([place]) 10/10/2014  . HLD (hyperlipidemia) 10/10/2014  . 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 Essential hypertension   . Carotid stenosis   . TIA (transient ischemic attack) 06/01/2014  . Malaise and fatigue 11/08/2013  . Lumbago   . Benign essential tremor   . Vitamin D deficiency     Past Medical History:  Past Medical History:  Diagnosis Date  . Anxiety state, unspecified   . Atrial fibrillation with RVR ([place]) 12/14/2015  . 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 Complication of  anesthesia    " I woke up in the recovery room with tube in throat and panicked."  . 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 Herpes zoster without mention of complication   . History of hiatal hernia   . Hypertrophy of prostate with urinary obstruction and other lower urinary tract symptoms (LUTS)   . Insomnia, unspecified   . Lumbago   . MSSA (methicillin susceptible Staphylococcus aureus) septicemia ([place]) 12/23/2015  . Other and unspecified hyperlipidemia   . 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 Pneumonia    bronchial  . Reflux esophagitis   . Seasonal allergies   . Stroke ([place])   . TIA (transient ischemic attack)   . Type II or unspecified type diabetes mellitus without mention of complication, not stated as uncontrolled   . Unspecified essential hypertension   . Unspecified vitamin D deficiency   . Urethral stricture unspecified   . Urinary frequency    Past Surgical History:  Past Surgical History:  Procedure Laterality Date  . CARDIAC CATHETERIZATION  1986   normal, Dr Lia Foyer  . ENDARTERECTOMY Left 06/08/2014   Procedure: LEFT CAROTID ARTERY ENDARTERECTOMY;  Surgeon: Rosetta Posner, MD;  Location: [facility];  Service: Vascular;  Laterality: Left;  . EYE SURGERY  2014   cataract  . EYE SURGERY  2013   Retina  . PATCH ANGIOPLASTY Left 06/08/2014   Procedure: WITH DACRON PATCH ANGIOPLASTY;  Surgeon: Rosetta Posner, MD;  Location: [facility];  Service: Vascular;  Laterality:  Left;  . SPINAL FUSION  12/91,11/91   obtained bone from left lower leg  . stimulator 1991     spine  . stress thallium  1994    normal;  . stretching bladder  07/2007   Dr Risa Grill  . TEE WITHOUT CARDIOVERSION N/A 12/20/2015   Procedure: TRANSESOPHAGEAL ECHOCARDIOGRAM (TEE);  Surgeon: Dorothy Spark, MD;  Location: [facility] ENDOSCOPY;  Service: Cardiovascular;  Laterality: N/A;    Assessment & Plan Clinical Impression: Patient is a 75 y.o.right handed malewith history of urethral stricture,  TIA on Plavix, carotid stenosis status post CEA, hypertension, diabetes mellitus, tremor.Per chart review, wife, and daughterpatient lives with spouse. One level home. 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 He presented 12/14/2015 with generalized weakness failure to thrive after recent fall.Patient found to be in atrial fibrillation with RVR. Initial chest x-ray evaluation revealed community-acquired pneumonia as well as findings of right lower lobe mass, closed fracture of T7vertebrae. CT of the chest showed rounded masslike consolidation in the medial aspect of the superior segment right lower lobe measuring 4.7 x 4.9 cm new from 07/07/2013. 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 Follow-up neurosurgery Dr. Annette Stable with review of MRI thoracic spine no plan for surgical intervention at this time. CT-guided biopsy performed 12/18/2015 with results suggesting infection. Radiation oncology consulted in regards to findings of mass plan to begin simulation and radiation treatment.  Infectious disease consulted for [name] with osteomyelitis and advised intravenous Vancomycin through 01/05/2016 then began cefazolin 2 g intravenous every 8 hours after vancomycin completed for a total treatment course of 6 weeks to be completed 01/29/2016. However sputum cultures later showed MRSA with cefazolin discontinued andinitiation of vancomycin . Echocardiogram without any vegetation. TEE on 12/21/2015 showed no endocarditis.. Cardiology continues to follow for atrial fibrillation with RVR status post conversion normal sinus rhythm after being maintained on Cardizem driptransitioned to by mouth and the addition of  Eliquisfor anticoagulation in addition to Plavix.  Patient transferred to CIR on 12/24/2015 .   Patient currently unable to participate in mobility OOB as pt is on bedside therapies only due to low Hgb but currently presents with muscle weakness and muscle paralysis, decreased cardiorespiratoy endurance, pain, decreased memory and decreased sitting balance, decreased standing balance, decreased postural control, decreased balance strategies and paraplegia.  Prior to hospitalization, patient was independent  with mobility and lived with Spouse in a House (town home) home.  Home access is  Level entry.  Patient will benefit from skilled PT intervention to maximize safe functional mobility, minimize fall risk and decrease caregiver burden for planned discharge home with 24 hour assist.  Anticipate patient will benefit from follow up [facility] at discharge.  PT - End of Session Activity Tolerance: Decreased this session Endurance Deficit: Yes Endurance Deficit Description: low hgb; bedside therapies day of eval PT Assessment Rehab Potential (ACUTE/IP ONLY): Good PT Patient demonstrates impairments in the following area(s): Balance;Endurance;Motor;Pain;Sensory;Skin Integrity PT Transfers Functional Problem(s): Bed Mobility;Bed to Chair;Car;Furniture PT Locomotion Functional Problem(s): Wheelchair Mobility PT Plan PT Intensity: Minimum of 1-2 x/day ,45 to 90 minutes PT Frequency: 5 out of 7 days PT Duration Estimated Length of Stay: 3-4 weeks PT Treatment/Interventions: Balance/vestibular training;Cognitive remediation/compensation;Community reintegration;Discharge planning;Disease management/prevention;DME/adaptive equipment instruction;Functional mobility training;Neuromuscular re-education;Pain management;Patient/family education;Psychosocial support;Skin  care/wound management;Splinting/orthotics;Therapeutic Activities;Therapeutic Exercise;UE/LE Strength taining/ROM;UE/LE Coordination activities;Wheelchair  propulsion/positioning PT Transfers Anticipated Outcome(s): Supervision PT Locomotion Anticipated Outcome(s): Mod I w/c level PT Recommendation Recommendations for Other Services: Speech consult;Neuropsych consult Follow Up Recommendations: Home health PT;24 hour supervision/assistance Patient destination: Home Equipment Recommended: Sliding board;Wheelchair (measurements);Wheelchair cushion (measurements);Other (comment) Equipment Details: hospital bed  Skilled Therapeutic Intervention Pt received in bed; pt on hold today with orders for bedside therapies only due to low HGB and awaiting blood transfusion and work up by GI.  Upon entering pt reporting significant abdominal pain, fatigue and difficulty with breath support during conversation.  Pt agreeable to bedside evaluation.  Discussed with pt PLOF, home set up, assistance available at D/C, equipment recommendations, goals of therapy once pt cleared for OOB activity.  Manual w/c noted to be in room brought up from acute; pt thought that was his w/c for home and did not recall every sitting in it.  Will need to assess for more appropriate seating and positioning equipment once cleared for OOB activity.  Performed assessment of LE strength and sensation.  Pt noted to be intermittently moving each foot but pt unaware of movement and unable to perform on command.  Pt left with all items within reach.  PT Evaluation Precautions/Restrictions Precautions Precautions: Fall Precaution Comments: Paraplegia, multiple sacral and buttocks wounds; limit time OOB to 2 hours at a time General Chart Reviewed: Yes PT Amount of Missed Time (min): 45 Minutes PT Missed Treatment Reason: MD hold (Comment) (bedside therapy due to low hgb) Response to Previous Treatment: Other (Comment) (pt unable to participate in full evaluation ) Family/Caregiver Present: No Vital SignsTherapy Vitals Pulse Rate: 66 Resp: 18 BP: 138/68 Patient Position (if appropriate):  Sitting Oxygen Therapy SpO2: 98 % O2 Device: Not Delivered Pain Pain Assessment Pain Assessment: 0-10 Pain Score: 10-Worst pain ever Pain Type: Acute pain Pain Location: Abdomen Pain Orientation: Mid Pain Descriptors / Indicators: Grimacing;Discomfort Pain Onset: On-going Home Living/Prior Functioning Home Living Available Help at Discharge: Family;Available 24 hours/day (may also hire daily caregivers) Type of Home: House (town home) Home Access: Level entry Home Layout: One level  Lives With: Spouse Prior Function Level of Independence: Independent with gait;Independent with transfers  Able to Take Stairs?: Yes Driving: Yes Vocation: Full time employment Comments: works full time as a used Scientist, 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 Light Touch: Impaired Detail Light Touch Impaired Details: Impaired RLE;Impaired LLE Stereognosis: Not tested Hot/Cold: Impaired by gross assessment Proprioception: Impaired Detail Proprioception Impaired Details: Impaired RLE;Impaired LLE Coordination Gross Motor Movements are Fluid and Coordinated: No Coordination and Movement Description: Paraplegia Motor  Motor Motor: Paraplegia;Abnormal tone;Abnormal postural alignment and control  Mobility   TBA formally once pt cleared for OOB activity Locomotion    TBA formally once pt cleared for OOB activity Trunk/Postural Assessment   TBA formally once pt cleared for OOB activity Balance  TBA once pt cleared for OOB activity Extremity Assessment  RLE Assessment RLE Assessment: Exceptions to [facility] RLE Strength RLE Overall Strength: Deficits RLE Overall Strength Comments: 0/5 active; sporadic involuntary movement in feet/ankles noted LLE Assessment LLE Assessment: Exceptions to [facility] LLE Strength LLE Overall Strength: Deficits LLE Overall Strength Comments: 0/5 active; sporadic involuntary movement in feet/ankles noted   See Function Navigator for  Current Functional Status.   Refer to Care Plan for Long Term Goals  Recommendations for other services: Neuropsych and Other: speech evaluation  Discharge Criteria: Patient will be discharged from PT if patient refuses  treatment 3 consecutive times without medical reason, if treatment goals not met, if there is a change in medical status, if patient makes no progress towards goals or if patient is discharged from hospital.  The above assessment, treatment plan, treatment alternatives and goals were discussed and mutually agreed upon: by patient  Malachy Mood 12/25/2015, 12:33 PM

## 2015-12-25 NOTE — Progress Notes (Signed)
Social Work Patient ID: Phebe Colla, male   DOB: 1940-09-12, 75 y.o.   MRN: [id number]   CSW made multiple attempts to meet pt/wife, but pt with staff each time due to medical issues.  Pt now to transfer back to acute hospital as a result.  Pt was not yet evaluated by all therapies prior to need to transfer.  Pt will need to be reassessed for appropriateness for readmission to Rehab.

## 2015-12-25 NOTE — Discharge Summary (Signed)
Discharge summary job # [id number]

## 2015-12-25 NOTE — Progress Notes (Addendum)
Subjective/Complaints: Pt states he has not noticed any blood in stool No abd pain , nausea or vomiting  ROS-  No chest pain , SOB, MSK pain, +bowel incont  Objective: Vital Signs: Blood pressure 138/68, pulse 66, temperature 97.7 F (36.5 C), temperature source Oral, resp. rate 18, height 5' 7"  (1.702 m), weight 87 kg (191 lb 12.8 oz), SpO2 98 %. No results found. Results for orders placed or performed during the hospital encounter of 12/24/15 (from the past 72 hour(s))  Glucose, capillary     Status: Abnormal   Collection Time: 12/24/15  9:11 PM  Result Value Ref Range   Glucose-Capillary 132 (H) 65 - 99 mg/dL   Comment 1 Notify RN   Glucose, capillary     Status: Abnormal   Collection Time: 12/24/15 11:28 PM  Result Value Ref Range   Glucose-Capillary 147 (H) 65 - 99 mg/dL   Comment 1 Notify RN   CBC WITH DIFFERENTIAL     Status: Abnormal   Collection Time: 12/25/15  5:11 AM  Result Value Ref Range   WBC 8.4 4.0 - 10.5 K/uL   RBC 2.54 (L) 4.22 - 5.81 MIL/uL   Hemoglobin 7.7 (L) 13.0 - 17.0 g/dL   HCT 23.8 (L) 39.0 - 52.0 %   MCV 93.7 78.0 - 100.0 fL   MCH 30.3 26.0 - 34.0 pg   MCHC 32.4 30.0 - 36.0 g/dL   RDW 15.1 11.5 - 15.5 %   Platelets 302 150 - 400 K/uL   Neutrophils Relative % 96 %   Neutro Abs 8.1 (H) 1.7 - 7.7 K/uL   Lymphocytes Relative 3 %   Lymphs Abs 0.3 (L) 0.7 - 4.0 K/uL   Monocytes Relative 1 %   Monocytes Absolute 0.1 0.1 - 1.0 K/uL   Eosinophils Relative 0 %   Eosinophils Absolute 0.0 0.0 - 0.7 K/uL   Basophils Relative 0 %   Basophils Absolute 0.0 0.0 - 0.1 K/uL  Comprehensive metabolic panel     Status: Abnormal   Collection Time: 12/25/15  5:11 AM  Result Value Ref Range   Sodium 140 135 - 145 mmol/L   Potassium 4.8 3.5 - 5.1 mmol/L   Chloride 108 101 - 111 mmol/L   CO2 25 22 - 32 mmol/L   Glucose, Bld 132 (H) 65 - 99 mg/dL   BUN 55 (H) 6 - 20 mg/dL   Creatinine, Ser 0.75 0.61 - 1.24 mg/dL   Calcium 7.2 (L) 8.9 - 10.3 mg/dL   Total  Protein 4.3 (L) 6.5 - 8.1 g/dL   Albumin 1.7 (L) 3.5 - 5.0 g/dL   AST 29 15 - 41 U/L   ALT 20 17 - 63 U/L   Alkaline Phosphatase 67 38 - 126 U/L   Total Bilirubin 0.6 0.3 - 1.2 mg/dL   GFR calc non Af Amer >60 >60 mL/min   GFR calc Af Amer >60 >60 mL/min    Comment: (NOTE) The eGFR has been calculated using the CKD EPI equation. This calculation has not been validated in all clinical situations. eGFR's persistently <60 mL/min signify possible Chronic Kidney Disease.    Anion gap 7 5 - 15  Glucose, capillary     Status: Abnormal   Collection Time: 12/25/15  6:31 AM  Result Value Ref Range   Glucose-Capillary 140 (H) 65 - 99 mg/dL   Comment 1 Notify RN   CBC     Status: Abnormal   Collection Time: 12/25/15  7:00 AM  Result Value Ref Range   WBC 6.8 4.0 - 10.5 K/uL   RBC 2.43 (L) 4.22 - 5.81 MIL/uL   Hemoglobin 7.4 (L) 13.0 - 17.0 g/dL   HCT 22.7 (L) 39.0 - 52.0 %   MCV 93.4 78.0 - 100.0 fL   MCH 30.5 26.0 - 34.0 pg   MCHC 32.6 30.0 - 36.0 g/dL   RDW 15.0 11.5 - 15.5 %   Platelets 278 150 - 400 K/uL      General: No acute distress Mood and affect are appropriate Heart: Regular rate and rhythm no rubs murmurs or extra sounds Lungs: Clear to auscultation, breathing unlabored, no rales or wheezes Abdomen: Positive bowel sounds, soft nontender to palpation, nondistended Extremities: No clubbing, cyanosis, or edema Skin: No evidence of breakdown, no evidence of rash Neurologic: Cranial nerves II through XII intact, motor strength is 5/5 in bilateral deltoid, bicep, tricep, grip, 0/5 hip flexor, knee extensors, ankle dorsiflexor and plantar flexor Sensory exam normal sensation to light touch and proprioception in bilateral upper and lower extremities  Musculoskeletal: Full range of motion in all 4 extremities. No joint swelling   Assessment/Plan: 1. 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 Physiatrist is providing close team supervision and 24 hour management of active medical problems listed below. Physiatrist and rehab team continue to assess barriers to discharge/monitor patient progress toward functional and medical goals. FIM:             Function - Chair/bed transfer Chair/bed transfer activity did not occur: Safety/medical concerns (bedside therapy on day of eval)  Function - Locomotion: Wheelchair Will patient use wheelchair at discharge?: Yes Type: Manual Wheelchair activity did not occur: Safety/medical concerns (bedside therapy on day of eval) Wheel 50 feet with 2 turns activity did not occur: Safety/medical concerns Wheel 150 feet activity did not occur: Safety/medical concerns Function - Locomotion: Ambulation Ambulation activity did not occur: Safety/medical concerns (t4 paraplegia) Walk 10 feet activity did not occur: Safety/medical concerns Walk 50 feet with 2 turns activity did not occur: Safety/medical concerns Walk 150 feet activity did not occur: Safety/medical concerns Walk 10 feet on uneven surfaces activity did not occur: Safety/medical concerns  Function - Comprehension Comprehension: Auditory Comprehension assist level: Follows basic conversation/direction with no assist  Function - Expression Expression: Verbal Expression assist level: Expresses basic needs/ideas: With no assist  Function - Social Interaction Social Interaction assist level: Interacts appropriately with others with medication or extra time (anti-anxiety, antidepressant).  Function - Problem Solving Problem solving assist level: Solves basic 90% of the time/requires cueing < 10% of the time  Function - Memory Memory assist level: Recognizes or recalls 75 - 89% of the time/requires cueing 10 - 24% of the time Patient normally able to recall (first 3 days only): Current season, Location of own room, Staff names and  faces, That he or she is in a hospital Medical Problem List and Plan: 1.  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  Start Bedside therapy today 2.  DVT Prophylaxis/Anticoagulation: Eliquis- on hold due to 4gm drop in Hgb over 4 d 3. Pain Management: Hydrocodone as needed 4. Mood: Ativan 0.5 mg daily and prn 5. Neuropsych: This patient is capable of making decisions on his own behalf. 6. Skin/Wound Care: Follow-up [facility]OC for sacrum and bilateral buttocks ulcers 7. Fluids/Electrolytes/Nutrition: Routine skin checks 8.ID/MSSA/MRSA. Presently on vancomycin through 01/05/2016 and  then begin cefazolin 2 g intravenous every 8 hours after vancomycin completed for a total course of 6 weeks to be completed 01/29/2016 9. Neurogenic bowel and bladder. Establish bowel program. Provide full education 10. Atrial fibrillation with RVR. Continue Cardizem 180 mg daily. Cardiac rate controlled. Follow-up cardiology services, Will need to hold Eliquis due to drop in Hgb 11. Hyperlipidemia. Lipitor 12. Diabetes mellitus type 2. Diet controlled. Monitor while on Decadron therapy. 13.  Anemia with ~ 4 gm drop in Hgb in 4 days, no dizziness, no abd pain to suggest GI bleed, will check stool guaic, consult GI, hold Eliquis for now, transfuse 2 U PRBC 14.  Calf DVT no clot in popliteal, will repeat doppler in 5 d, plan to resume eliquis if post transfusion hgb ok and oked by GI LOS (Days) 1 A FACE TO FACE EVALUATION WAS PERFORMED  Ardit Danh E 12/25/2015, 9:32 AM

## 2015-12-25 NOTE — Consult Note (Signed)
The patient has been seen in conjunction with Melina Copa, PA-C. All aspects of care have been considered and discussed. The patient has been personally interviewed, examined, and all clinical data has been reviewed.   Complicated story in a patient with multiple medical issues currently. Has documented PAF during last hospital stay. Also has history of carotid disease with left carotid related TIA in the past. Plavix was started because of carotid disease. Eliquis was added during the last hospital visit because of PAF.  Melena with significant drop in hemoglobin from baseline 11.5->7.7 noted within past 24 hours.  Agree with discontinuation of antiplatelet and anticoagulation therapy.  Will need GI evaluation before resuming therapy.  Going forward, we would prefer Eliquis monotherapy unless there are contraindications.   Cardiology Consultation Note    Patient ID: Marcus [name], MRN: [id number], DOB/AGE: [birth year] 75 y.o. Admit date: 12/24/2015   Date of Consult: 12/25/2015 Primary Physician: Jeanmarie Hubert, MD Primary Cardiologist: IDr. Oval Linsey (listed previously as Bronson Ing as this was the first cardiologist that saw him last admission, but patient lives in [place] so Dr. Oval Linsey was the next MD to see)  Chief Complaint: dark stool Reason for Consultation: GIB, recent initiation of Eliquis for afib Requesting MD: Lauraine Rinne, PA-C  HPI: Marcus [name] is a 75 y.o. male with history of HTN, DM, TIA on clopidogrel, carotid disease s/p LCEA 2016, urethral stricture, GERD, and recently diagnosed quadriplegia, bacteremia, and atrial fib RVR whom we are asked to see regarding anticoagulation. He was admitted 10/14-10/24 after presenting with weakness/fall. He was found to have a T7 compression fracture possibly lytic versus infectious, with right lower lobe mass. Biopsy of right lung mass revealed inflammation/fibrosis favoring inflammatory process but malignancy not entirely  ruled out, cultures with MSSA. Sputum cultures also showed MRSA as well. TEE 12/21/15 without endocarditis. Cardiology followed him during admission for his afib, which he went in and out of during admission. He developed junctional bradycardia on digoxin so this was stopped. 2D Echo 12/17/15: EF 60-65%, grade 2 DD, calcified MV annulus. He was on Plavix throughout the duration of his stay for prior h/o TIA with recommendation to start Eliquis when all procedures were complete. He was started on Eliquis on 12/22/15. He was discharged to CIR on 12/23/15. Per review with nurse he had had some dark sludgy stools per her discussion with overnight staff. Labs were rechecked this morning showing a Hgb of 7.7 with recheck 7.4, down from 11.5 on 12/21/15. Other labs notable for albumin of 1.7. LE duplex today also showed a DVT in the right soleal veins and left peroneal veins.  The patient is not a great historian, appears flustered, citing there are too many people asking him questions. He reports the "same pain" he's had ever since he came to the hospital which is a bandlike discomfort under both ribcages into his epigastrum. He also notes mild SOB which is unchanged. No palpitations or syncope. BP and HR are stable. EKG shows NSR with nonspecific T wave changes. Eliquis was stopped and I also placed order to stop Plavix. Primary team ordered 2 U PRBC. GI has seen and started IV Protonix, feels he will need EGD at some point. They recommended transfer back to the medical side of the hospital and I agree.    Past Medical History:  Diagnosis Date  . Anxiety state, unspecified   . 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 Carotid artery disease ([place])    a. s/p  CEA.  . Complication of anesthesia    " I woke up in the recovery room with tube in throat and panicked."  . 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 Essential hypertension e  . Herpes zoster without mention of complication   . History of hiatal  hernia   . Hypertrophy of prostate with urinary obstruction and other lower urinary tract symptoms (LUTS)   . Insomnia, unspecified   . Junctional bradycardia    a. developed this with digoxin 12/2015 requiring cessation.  . Lumbago   . Lung mass    a. 12/2015: right lower lobe mass felt secondary to MSSA. Biopsy of right lung mass revealed inflammation/fibrosis favoring inflammatory process but malignancy not entirely ruled out.  Marland Kitchen MSSA (methicillin susceptible Staphylococcus aureus) septicemia ([place]) 12/23/2015  . Other and unspecified hyperlipidemia   . 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 Paroxysmal atrial fibrillation ([place]) 12/14/2015   a. dx 12/2015, in/out on telemetry, digoxin stopped 2/2 junctional bradycardia.  . Pneumonia    bronchial  . Reflux esophagitis   . Seasonal allergies   . Stroke ([place])   . TIA (transient ischemic attack)   . Type II or unspecified type diabetes mellitus without mention of complication, not stated as uncontrolled   . Unspecified vitamin D deficiency   . Urethral stricture unspecified   . Urinary frequency       Surgical History:  Past Surgical History:  Procedure Laterality Date  . CARDIAC CATHETERIZATION  1986   normal, Dr Lia Foyer  . ENDARTERECTOMY Left 06/08/2014   Procedure: LEFT CAROTID ARTERY ENDARTERECTOMY;  Surgeon: Rosetta Posner, MD;  Location: [facility];  Service: Vascular;  Laterality: Left;  . EYE SURGERY  2014   cataract  . EYE SURGERY  2013   Retina  . PATCH ANGIOPLASTY Left 06/08/2014   Procedure: WITH DACRON PATCH ANGIOPLASTY;  Surgeon: Rosetta Posner, MD;  Location: [facility];  Service: Vascular;  Laterality: Left;  . SPINAL FUSION  12/91,11/91   obtained bone from left lower leg  . stimulator 1991     spine  . stress thallium  1994    normal;  . stretching bladder  07/2007   Dr Risa Grill  . TEE WITHOUT CARDIOVERSION N/A 12/20/2015   Procedure: TRANSESOPHAGEAL ECHOCARDIOGRAM (TEE);  Surgeon: Dorothy Spark, MD;  Location:  [facility] ENDOSCOPY;  Service: Cardiovascular;  Laterality: N/A;     Home Meds: Prior to Admission medications   Medication Sig Start Date End Date Taking? Authorizing Provider  apixaban (ELIQUIS) 5 MG TABS tablet Take 1 tablet (5 mg total) by mouth 2 (two) times daily. 12/24/15  Yes Venetia Maxon Rama, MD  atorvastatin (LIPITOR) 40 MG tablet TAKE ONE TABLET BY MOUTH ONCE DAILY FOR CHOLESTEROL Patient taking differently: TAKE ONE TABLET BY MOUTH ONCE DAILY AT BEDTIME FOR CHOLESTEROL 11/20/15  Yes Estill Dooms, MD  Cholecalciferol (VITAMIN D3) 5000 units TABS Take 5,000 Units by mouth at bedtime.   Yes Historical Provider, MD  clopidogrel (PLAVIX) 75 MG tablet TAKE 1 TABLET BY MOUTH DAILY 07/04/15  Yes Rosetta Posner, MD  dexamethasone (DECADRON) 10 MG/ML injection Inject 1 mL (10 mg total) into the vein every 6 (six) hours. 12/24/15  Yes Venetia Maxon Rama, MD  diltiazem (CARDIZEM CD) 180 MG 24 hr capsule Take 1 capsule (180 mg total) by mouth daily. 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 12/24/15  Yes Venetia Maxon Rama, MD  HYDROcodone-acetaminophen (NORCO/VICODIN) 5-325 MG tablet Take 0.5-1 tablets by mouth every 6 (six) hours as needed for moderate pain.    Yes Historical Provider, MD  LORazepam (ATIVAN) 0.5 MG tablet Take 0.5 mg by mouth See admin instructions. Take 1 tablet (0.5 mg) by mouth every morning, may also take 1 tablet 2 more times during the day as needed for anxiety 11/21/15  Yes Historical Provider, MD  nicotine polacrilex (COMMIT) 2 MG lozenge Take 2 mg by mouth every 2 (two) hours.    Yes Historical  Provider, MD  ondansetron (ZOFRAN) 4 MG tablet Take 1 tablet (4 mg total) by mouth every 8 (eight) hours as needed for nausea or vomiting. 12/02/15  Yes Lauree Chandler, NP  polyethylene glycol (MIRALAX / GLYCOLAX) packet Take 17 g by mouth daily. 12/25/15  Yes Christina P Rama, MD  senna-docusate (SENOKOT-S) 8.6-50 MG tablet Take 1 tablet by mouth 2 (two) times daily. 12/24/15  Yes Christina P Rama, MD  timolol (BETIMOL) 0.5 % ophthalmic solution Place 1 drop into both eyes 2 (two) times daily.   Yes Historical Provider, MD  vancomycin 1,250 mg in sodium chloride 0.9 % 250 mL Inject 1,250 mg into the vein every 12 (twelve) hours. 12/24/15  Yes Venetia Maxon Rama, MD    Inpatient Medications:  . sodium chloride   Intravenous Once  . atorvastatin  40 mg Oral q1800  . [START ON 01/06/2016]  ceFAZolin (ANCEF) IV  2 g Intravenous Q8H  . cholecalciferol  5,000 Units Oral QHS  . dexamethasone  10 mg Oral Q6H  . diltiazem  180 mg Oral Daily  . DULoxetine  30 mg Oral Q supper  . insulin aspart  0-9 Units Subcutaneous Q6H  . LORazepam  0.5 mg Oral Daily  . pantoprazole (PROTONIX) IVPB  80 mg Intravenous Once  . polyethylene glycol  17 g Oral Daily  . senna-docusate  1 tablet Oral BID  . timolol  1 drop Both Eyes BID  . vancomycin  1,250 mg Intravenous Q12H   . pantoprozole (PROTONIX) infusion      Allergies:  Allergies  Allergen Reactions  . Codeine Other (See Comments)    constipation  . Demerol [Meperidine] Other (See Comments)    Drops blood pressure   . Contrast Media [Iodinated Diagnostic Agents] Nausea And Vomiting  . Gabapentin Other (See Comments)    Suicidal, depression  . Other Other (See Comments)    All narcotics make pt hallucinate    Social History   Social History  . Marital status: Married    Spouse name: N/A  . Number of children: N/A  . Years of education: N/A   Occupational History  . Not on file.   Social History Main Topics  . Smoking status: Former  Smoker    Types: Cigarettes    Quit date: 01/01/1991  . Smokeless tobacco: Never Used     Comment: " Quit around 1986"  . Alcohol use No  . Drug use: No  . Sexual activity: Yes    Birth control/ protection: None   Other Topics Concern  . Not on file   Social  History Narrative  . No narrative on file     Family History  Problem Relation Age of Onset  . Diabetes Mother   . Cancer Mother   . Hypertension Father   . Stroke Father   . Alcohol abuse Brother      Review of Systems: Diffuse weakness with myelopathy. All other systems reviewed and are otherwise negative except as noted above.  Labs:  Lab Results  Component Value Date   WBC 6.8 12/25/2015   HGB 7.4 (L) 12/25/2015   HCT 22.7 (L) 12/25/2015   MCV 93.4 12/25/2015   PLT 278 12/25/2015    Recent Labs Lab 12/25/15 0511  NA 140  K 4.8  CL 108  CO2 25  BUN 55*  CREATININE 0.75  CALCIUM 7.2*  PROT 4.3*  BILITOT 0.6  ALKPHOS 67  ALT 20  AST 29  GLUCOSE 132*   Lab Results  Component Value Date   CHOL 52 12/15/2015   HDL 10 (L) 12/15/2015   LDLCALC 30 12/15/2015   TRIG 61 12/15/2015     Radiology/Studies:  Ct Abdomen Pelvis Wo Contrast  Result Date: 12/14/2015 CLINICAL DATA:  Abdominal distention and pain. Shortness of breath. Decreased appetite. EXAM: CT CHEST, ABDOMEN AND PELVIS WITHOUT CONTRAST TECHNIQUE: Multidetector CT imaging of the chest, abdomen and pelvis was performed following the standard protocol without IV contrast. COMPARISON:  CT abdomen pelvis 12/01/2015 and CT chest 07/07/2013. FINDINGS: CT CHEST FINDINGS Cardiovascular: Atherosclerotic calcification of the arterial vasculature, including three-vessel involvement of the coronary arteries. Heart size normal. There is pericardial calcification, indicative of prior pericarditis. No pericardial effusion. Mediastinum/Nodes: No pathologically enlarged mediastinal or axillary lymph nodes. Hilar regions are difficult to definitively evaluate  without IV contrast. Esophagus is grossly unremarkable. Lungs/Pleura: Mild centrilobular emphysema. Rounded masslike consolidation in the medial aspect of the superior segment right lower lobe measures approximately 4.7 x 4.9 cm, is new from 07/07/2013, has internal ossific/calcific debris and obscures the extrapleural fat plane. Soft tissue extends around the adjacent vertebral body into the left extrapleural space. Trace right pleural effusion. Lungs are otherwise clear. Musculoskeletal: Lucency and destruction of the T7 vertebral body is seen with surrounding soft tissue. There is lysis within the anterior aspect of the T6 vertebral body as well. Airway is unremarkable. CT ABDOMEN PELVIS FINDINGS Hepatobiliary: Liver and gallbladder are unremarkable. No biliary ductal dilatation. Pancreas: Negative. Spleen: Negative. Adrenals/Urinary Tract: Adrenal glands are unremarkable. Inferior aspects of both kidneys are obscured by streak artifact from spinal hardware. Low-attenuation lesions in the left kidney measure up to 2.0 cm and are difficult to further characterize without post-contrast imaging. Ureters are decompressed. There are scattered bladder diverticulum. Stomach/Bowel: Stomach, small bowel, appendix and colon are unremarkable. Vascular/Lymphatic: Atherosclerotic calcification of the arterial vasculature without abdominal aortic aneurysm. Postoperative changes are seen at the aortic bifurcation. No pathologically enlarged lymph nodes. Reproductive: Prostate is slightly enlarged and calcified. Other: No free fluid.  Mesenteries and peritoneum are unremarkable. Musculoskeletal: Postoperative changes in the lumbar spine. No additional worrisome lytic or sclerotic lesions. Bone graft harvest sites in the iliac wings. IMPRESSION: 1. Masslike consolidation in the right lower lobe with invasion of the extrapleural fat, worrisome for primary bronchogenic carcinoma. Lymphoma and sarcoma are considered less likely. 2.  Lytic destruction and compression of the T7 vertebral body with surrounding soft tissue, consistent with a pathologic fracture. Minimal involvement of the T6 vertebral body. 3. Trace right pleural effusion. 4. No evidence of primary malignancy or metastatic disease in the abdomen  or pelvis. 5.  Aortic atherosclerosis (ICD10-170.0). Electronically Signed   By: Lorin Picket M.D.   On: 12/14/2015 14:21   Dg Chest 2 View  Result Date: 12/17/2015 CLINICAL DATA:  Shortness of breath, left-sided chest pain EXAM: CHEST  2 VIEW COMPARISON:  CT chest of 12/14/2015, chest x-ray of 12/14/2015, and two-view chest x-ray of 11/12/2015 FINDINGS: There is abnormal opacity overlying the right hilum and suprahilar region consistent with the soft tissue mass described on recent CT of the chest. In view of the rapid development since chest x-ray of 11/12/2015, an infectious or inflammatory process would be more likely than neoplasm. No pneumonia is seen and there is a small right pleural effusion present. The heart is within upper limits normal. Partial compression of T8 and the anterior inferior aspect of T7 is noted which could also either be infectious, inflammatory, or neoplastic in origin. IMPRESSION: 1. Abnormal opacity overlies the right hilum/suprahilar region consistent with the previously described soft tissue mass involving the posterior medial right lower lobe and thoracic vertebral column. Favor infectious or inflammatory process as noted above. 2. Small right pleural effusion. Electronically Signed   By: Ivar Drape M.D.   On: 12/17/2015 11:57   Dg Ribs Unilateral W/chest Left  Result Date: 11/28/2015 CLINICAL DATA:  Posterior thoracic tenderness over the ribs. Evaluate for fracture. EXAM: LEFT RIBS AND CHEST - 3+ VIEW COMPARISON:  11/12/2015 FINDINGS: No fracture or other bone lesions are seen involving the ribs. There is no evidence of pneumothorax or pleural effusion. Both lungs are clear. Heart size and  mediastinal contours are within normal limits. IMPRESSION: Negative. Electronically Signed   By: Monte Fantasia M.D.   On: 11/28/2015 16:09   Ct Head Wo Contrast  Result Date: 12/14/2015 CLINICAL DATA:  Generalized weakness, headache and photosensitivity. Decreased appetite. Fall yesterday with loss of sensation in right leg, initial encounter. EXAM: CT HEAD WITHOUT CONTRAST TECHNIQUE: Contiguous axial images were obtained from the base of the skull through the vertex without intravenous contrast. COMPARISON:  None. FINDINGS: Brain: No evidence of an acute infarct, acute hemorrhage, mass lesion, mass effect or hydrocephalus. Small area of encephalomalacia in the left occipital lobe, stable. Atrophy. Periventricular low attenuation. Vascular: No hyperdense vessel or unexpected calcification. Skull: Normal. Negative for fracture or focal lesion. Sinuses/Orbits: No acute finding. Other: None. IMPRESSION: 1. No acute intracranial abnormality. 2. Atrophy and chronic microvascular white matter ischemic changes. 3. Small area of encephalomalacia in the left occipital lobe. Electronically Signed   By: Lorin Picket M.D.   On: 12/14/2015 10:31   Ct Chest Wo Contrast  Result Date: 12/14/2015 CLINICAL DATA:  Abdominal distention and pain. Shortness of breath. Decreased appetite. EXAM: CT CHEST, ABDOMEN AND PELVIS WITHOUT CONTRAST TECHNIQUE: Multidetector CT imaging of the chest, abdomen and pelvis was performed following the standard protocol without IV contrast. COMPARISON:  CT abdomen pelvis 12/01/2015 and CT chest 07/07/2013. FINDINGS: CT CHEST FINDINGS Cardiovascular: Atherosclerotic calcification of the arterial vasculature, including three-vessel involvement of the coronary arteries. Heart size normal. There is pericardial calcification, indicative of prior pericarditis. No pericardial effusion. Mediastinum/Nodes: No pathologically enlarged mediastinal or axillary lymph nodes. Hilar regions are difficult to  definitively evaluate without IV contrast. Esophagus is grossly unremarkable. Lungs/Pleura: Mild centrilobular emphysema. Rounded masslike consolidation in the medial aspect of the superior segment right lower lobe measures approximately 4.7 x 4.9 cm, is new from 07/07/2013, has internal ossific/calcific debris and obscures the extrapleural fat plane. Soft tissue extends around the adjacent vertebral body  into the left extrapleural space. Trace right pleural effusion. Lungs are otherwise clear. Musculoskeletal: Lucency and destruction of the T7 vertebral body is seen with surrounding soft tissue. There is lysis within the anterior aspect of the T6 vertebral body as well. Airway is unremarkable. CT ABDOMEN PELVIS FINDINGS Hepatobiliary: Liver and gallbladder are unremarkable. No biliary ductal dilatation. Pancreas: Negative. Spleen: Negative. Adrenals/Urinary Tract: Adrenal glands are unremarkable. Inferior aspects of both kidneys are obscured by streak artifact from spinal hardware. Low-attenuation lesions in the left kidney measure up to 2.0 cm and are difficult to further characterize without post-contrast imaging. Ureters are decompressed. There are scattered bladder diverticulum. Stomach/Bowel: Stomach, small bowel, appendix and colon are unremarkable. Vascular/Lymphatic: Atherosclerotic calcification of the arterial vasculature without abdominal aortic aneurysm. Postoperative changes are seen at the aortic bifurcation. No pathologically enlarged lymph nodes. Reproductive: Prostate is slightly enlarged and calcified. Other: No free fluid.  Mesenteries and peritoneum are unremarkable. Musculoskeletal: Postoperative changes in the lumbar spine. No additional worrisome lytic or sclerotic lesions. Bone graft harvest sites in the iliac wings. IMPRESSION: 1. Masslike consolidation in the right lower lobe with invasion of the extrapleural fat, worrisome for primary bronchogenic carcinoma. Lymphoma and sarcoma are  considered less likely. 2. Lytic destruction and compression of the T7 vertebral body with surrounding soft tissue, consistent with a pathologic fracture. Minimal involvement of the T6 vertebral body. 3. Trace right pleural effusion. 4. No evidence of primary malignancy or metastatic disease in the abdomen or pelvis. 5.  Aortic atherosclerosis (ICD10-170.0). Electronically Signed   By: Lorin Picket M.D.   On: 12/14/2015 14:21   Ct Guided Needle Placement  Result Date: 12/18/2015 INDICATION: 75 year old male with a history of right lower lobe/posterior mediastinal mass with destruction of the T7 vertebral body. He also has new onset paraplegia. CT-guided biopsy is warranted to obtain tissue diagnosis. EXAM: CT GUIDANCE NEEDLE PLACEMENT MEDICATIONS: None. ANESTHESIA/SEDATION: Moderate (conscious) sedation was employed during this procedure. A total of Versed 2 mg and Fentanyl 100 mcg was administered intravenously. Moderate Sedation Time: 14 minutes. The patient's level of consciousness and vital signs were monitored continuously by radiology nursing throughout the procedure under my direct supervision. FLUOROSCOPY TIME:  Fluoroscopy Time: 0 minutes 0 seconds (0 mGy). COMPLICATIONS: None immediate. PROCEDURE: Informed written consent was obtained from the patient after a thorough discussion of the procedural risks, benefits and alternatives. All questions were addressed. Maximal Sterile Barrier Technique was utilized including caps, mask, sterile gowns, sterile gloves, sterile drape, hand hygiene and skin antiseptic. A timeout was performed prior to the initiation of the procedure. A planning axial CT scan was performed. The infiltrative mass along the medial aspect of the right lower lobe directly invading the posterior mediastinum was successfully identified. A suitable skin entry site was selected and marked. The region was sterilely prepped and draped in the standard fashion using chlorhexidine skin prep.  Local anesthesia was attained by infiltration with 1% lidocaine. A small dermatotomy was made. Under intermittent CT guidance, a 17 gauge introducer needle was advanced into the margin of the soft tissue mass. Multiple 18 gauge core biopsies were then coaxially obtained using the bio Pince automated biopsy device. Biopsy specimens were placed in saline and delivered to pathology for further analysis. Additional 20 gauge needle aspirate was performed and inject into a small amount of sterile saline for cultures. Post biopsy axial CT imaging demonstrates no evidence of pneumothorax, hemorrhage or other complicating feature. IMPRESSION: Technically successful CT-guided core biopsy of right lower lobe/ posterior mediastinal  mass lesion. Signed, Criselda Peaches, MD [facility] Radiology Electronically Signed   By: Jacqulynn Cadet M.D.   On: 12/18/2015 12:58   Mr Thoracic Spine W Wo Contrast  Result Date: 12/16/2015 CLINICAL DATA:  Invasive thoracic mass. Bilateral lower extremity anesthesia. EXAM: MRI THORACIC SPINE WITHOUT AND WITH CONTRAST TECHNIQUE: Multiplanar and multiecho pulse sequences of the thoracic spine were obtained without and with intravenous contrast. CONTRAST:  65mL MULTIHANCE GADOBENATE DIMEGLUMINE 529 MG/ML IV SOLN COMPARISON:  Chest CT 12/14/2015 FINDINGS: There is loss of the normal T1 weighted signal within the T6 and T7 vertebral bodies with associated contrast enhancement. This is in continuity with a posterior right chest wall mass that is better characterized on the recent chest CT. On the current examination, this mass measures approximately 4.4 x 4.1 cm. There is a peripherally enhancing collection along the dorsal aspect of the spinal canal extending from the level of the T5-T6 disc to the T9 level, measuring up to 5 mm in thickness. This contributes to severe attenuation of the thecal sac and compression of the spinal cord. There is  increased T2 weighted signal within the spinal cord at the T7 and T8 levels suggesting compressive myelopathy. Additionally, there is compression deformity of the T7 body with approximately 50% height loss centrally and mild retropulsion. The other thoracic levels are unremarkable without spinal canal stenosis. IMPRESSION: 1. Abnormal signal and contrast enhancement of the T6 and T7 vertebral bodies with associated T7 compression fracture. This is likely secondary to direct extension of the process within the right lower lobe. While this may be extension of a pulmonary malignancy invading the vertebral column, an infectious process of the lung with an associated discitis-osteomyelitis is also a possibility. Given this constellation of findings, atypical infectious processes (such as tuberculosis) should be considered. 2. 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 3. 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 Critical Value/emergent results were called by telephone at the time of interpretation on 12/16/2015 at 6:53 pm to Dr. Raiford Noble , who verbally acknowledged these results. Electronically Signed   By: Ulyses Jarred M.D.   On: 12/16/2015 18:54   Dg Chest Port 1 View  Result Date: 12/14/2015 CLINICAL DATA:  75 year old male with a history of chest pain EXAM: PORTABLE CHEST 1 VIEW COMPARISON:  11/28/2015 FINDINGS: Double density in the right hilum, new from the comparison. No pleural effusion or pneumothorax. No confluent airspace disease. No displaced fracture. IMPRESSION: Double density in the right hilum, new from the comparison plain film in September. Given the rapid development, this may reflect a developing infection in the medial lung, however,  contrast-enhanced CT is recommended to rule out adenopathy or mass. Signed, Dulcy Fanny. Earleen Newport, DO [facility] Electronically Signed   By: Corrie Mckusick D.O.   On: 12/14/2015 09:28   Ct Renal Stone Study  Result Date: 12/02/2015 CLINICAL DATA:  Swelling to both legs and the abdomen. Pain in the back radiating to the left flank and lower abdomen. History of urinary tract infection. Hematuria. EXAM: CT ABDOMEN AND PELVIS WITHOUT CONTRAST TECHNIQUE: Multidetector CT imaging of the abdomen and pelvis was performed following the standard protocol without IV contrast. COMPARISON:  11/19/2015 FINDINGS: Lower chest: Calcified  granuloma in the right lung base. Mild pericardial calcification. Small esophageal hiatal hernia. Hepatobiliary: Noncontrast appearance is unremarkable. Pancreas: Noncontrast appearance is unremarkable. Spleen: Noncontrast appearance is unremarkable. Adrenals/Urinary Tract: No adrenal gland nodules. Scarring or atrophy of the kidneys, more prominent on the right. Small cyst in the anterior left kidney. Stones in the lower pole right kidney. No hydronephrosis or hydroureter. No ureteral stones or bladder stones. No bladder wall thickening. Stomach/Bowel: Stomach and small bowel are decompressed. Scattered stool and gas in the colon without abnormal distention. Scattered colonic diverticula. No evidence of diverticulitis. Appendix is normal. Vascular/Lymphatic: Aortic atherosclerosis. No enlarged abdominal or pelvic lymph nodes. Reproductive: Prostate gland is enlarged, measuring 5.1 cm diameter. Prostate calcifications. Other: No abdominal wall hernia or abnormality. No abdominopelvic ascites. Musculoskeletal: Degenerative changes in the spine and hips. Postoperative changes with posterior fixation from L4 to the sacrum. Persistent moderate anterior subluxation of L5 on the sacrum. No change in the appearance of the spine since previous study.  IMPRESSION: Nonobstructing stone in the right kidney. No hydronephrosis or hydroureter. Prostate gland is enlarged. No evidence of bowel obstruction or inflammation. Electronically Signed   By: Lucienne Capers M.D.   On: 12/02/2015 00:56    Wt Readings from Last 3 Encounters:  12/25/15 191 lb 12.8 oz (87 kg)  12/24/15 205 lb 4.8 oz (93.1 kg)  12/01/15 206 lb (93.4 kg)    EKG: ectopic atrial rhythm 64bpm, nonspecific T wave changes with TWI V2-V3, flattening in I, II, III, avL, avF, V5-V6  Physical Exam: Blood pressure 134/73, pulse 66, temperature 97.5 F (36.4 C), temperature source Oral, resp. rate 18, height 5\' 7"  (1.702 m), weight 191 lb 12.8 oz (87 kg), SpO2 100 %. Body mass index is 30.04 kg/m. General: Chronically ill appearing pale WM in no acute distress. Head: Normocephalic, atraumatic, sclera non-icteric, no xanthomas, nares are without discharge.  Neck: Negative for carotid bruits. JVD not elevated. Lungs: Clear bilaterally to auscultation without wheezes, rales, or rhonchi. Breathing is unlabored. Heart: RRR with S1 S2. No murmurs, rubs, or gallops appreciated. Abdomen: Soft, non-tender, non-distended with normoactive bowel sounds. No hepatomegaly. No rebound/guarding. No obvious abdominal masses. Msk: Diffuse weakness. Extremities: No clubbing or cyanosis. No edema.  Distal pedal pulses are 2+ and equal bilaterally. Neuro: Oriented to self, said it was 2014, October, 27th, [place].  Psych: Flat affect.     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 Has ABL anemia and also diagnosed with LLE DVT 12/25/15.  1. ABL anemia with suspected upper GIB - GI on board. Receiving 2 U  PRBC per IM. Do agree he should be transferred to medical side for more acute monitoring. Dyspnea may be due to this, will need to follow. EKG with nonspecific T wave changes.  2. Paroxysmal atrial fibrillation - EKG shows what appears to be an ectopic atrial rhythm. Although CHADSVASC is high at 6, he has evidence of active GIB thus we agree with recommendation to hold Eliquis for now. He fortunately did not require DCCV last admission. Would also recommend to d/c Plavix indefinitely. Continue diltiazem. Monitor on telemetry once he is readmitted.  3. LLE DVT - per IM. ?Whether there is a need for IVC filter. DVT is below the  knee.  4. Recently diagnosed right lung mass, T7 compression fracture - initially concerning for metastatic malignancy but last IM notes indicated concern for infection. Further per IM.  5. Mild confusion - patient seems flustered by the flurry of activity in his room. Inaccurate answers to place and date but close. Will defer further assessment to primary team. Per review of hospital records from recent admission, his confusion waxed and waned.  Signed, Charlie Pitter PA-C 12/25/2015, 1:14 PM Pager: [phone]

## 2015-12-25 NOTE — Progress Notes (Signed)
Report given for admission to 3w. Patient continues to report pain in abdomen. Continue with plan of care. Mliss Sax

## 2015-12-25 NOTE — Progress Notes (Signed)
*  PRELIMINARY RESULTS* Vascular Ultrasound Lower extremity venous duplex has been completed.  Preliminary findings: DVT noted in the right soleal veins and the left peroneal veins.   Gave results to Dr. Letta Pate.   Landry Mellow, RDMS, RVT  12/25/2015, 10:34 AM

## 2015-12-25 NOTE — Progress Notes (Signed)
Patient discharged to 3w13 at approximately 73 with all belongings. Patient noted with increase swelling of bilateral feet and legs and  RN on 3w notified. Marcus [name]

## 2015-12-25 NOTE — Progress Notes (Signed)
Patient noted with decreasing hemoglobin 7.4. Orders received to transfuse 2 units PRBC. Patient and wife educated on blood transfusions and signed consent form. Patient denies shortness of breath and reports "stomach feels like it is burning". Patient is tolerating 1 st transfusion at this time. Plan to transfer patient to acute setting for telemetry monitoring.Continue with plan of care. Mliss Sax

## 2015-12-25 NOTE — Progress Notes (Signed)
Suspected GI bleed with gastroenterology currently following. Patient had been on Eliquis and Plavix upon admission to rehabilitation services for atrial fibrillation and history of TIA. Will consult cardiology concerning anticoagulation and plan of care.

## 2015-12-25 NOTE — H&P (Signed)
History and Physical    Marcus [name] [id number] DOB: 11-13-1940 DOA: (Not on file)  PCP: Jeanmarie Hubert, MD Patient coming from: CIR  Chief Complaint: acute blood loss anemia  HPI: Marcus [name] is a 75 y.o. male being transferred back to [facility] from [place]. Patient's current complaints include intermittent pulling or burning sensation of his epigastric and right upper and left upper quadrants. Sometimes describes the pain as a rope pulling him tight sensation. Worse with certain movements and deep breathing. Sometimes comes on as a crampy sensation that happens spontaneously. Pain typically lasts a few seconds. Per review of patient's recent care on the third floor and discussion of staff there are no reported dark or loose bloody stools prior to being transferred to CIR. Staff on CIR note dark to black stools for the last 24 hours. Denies any other new symptoms such as fevers, cough, shortness of breath, rash, palpitations, headache, neck stiffness. Symptoms appear to be getting worse   ROS: As per HPI otherwise 10 point review of systems negative.   Ambulatory Status: bed bound  Past Medical History:  Diagnosis Date  . Anxiety state, unspecified   . 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 Carotid artery disease ([place])    a. s/p L CEA 2016.  Marland Kitchen Complication of anesthesia    " I woke up in the recovery room with tube in throat and panicked."  . 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 Essential hypertension e  . Herpes zoster without mention of complication   . History of hiatal hernia   . Hypertrophy of prostate with urinary obstruction and other lower urinary tract symptoms (LUTS)   . Insomnia, unspecified   . Junctional bradycardia    a. developed this with digoxin 12/2015 requiring cessation.  . Lumbago   . Lung mass    a. 12/2015: right lower lobe mass felt secondary to MSSA. Biopsy of right lung mass revealed inflammation/fibrosis favoring  inflammatory process but malignancy not entirely ruled out.  Marland Kitchen MSSA (methicillin susceptible Staphylococcus aureus) septicemia ([place]) 12/23/2015  . Other and unspecified hyperlipidemia   . 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 Paroxysmal atrial fibrillation ([place]) 12/14/2015   a. dx 12/2015, in/out on telemetry, digoxin stopped 2/2 junctional bradycardia.  . Pneumonia    bronchial  . Reflux esophagitis   . Seasonal allergies   . Stroke ([place])   . TIA (transient ischemic attack)   . Type II or unspecified type diabetes mellitus without mention of complication, not stated as uncontrolled   . Unspecified vitamin D deficiency   . Urethral stricture unspecified   . Urinary frequency     Past Surgical History:  Procedure Laterality Date  . CARDIAC CATHETERIZATION  1986   normal, Dr Lia Foyer  . ENDARTERECTOMY Left 06/08/2014   Procedure: LEFT CAROTID ARTERY ENDARTERECTOMY;  Surgeon: Rosetta Posner, MD;  Location: [facility];  Service: Vascular;  Laterality: Left;  . EYE SURGERY  2014   cataract  . EYE SURGERY  2013   Retina  . PATCH ANGIOPLASTY Left 06/08/2014   Procedure: WITH DACRON PATCH ANGIOPLASTY;  Surgeon: Rosetta Posner, MD;  Location: [facility];  Service: Vascular;  Laterality: Left;  . SPINAL FUSION  12/91,11/91   obtained bone from left lower leg  . stimulator 1991     spine  . stress thallium  1994    normal;  . stretching bladder  07/2007   Dr Risa Grill  .  TEE WITHOUT CARDIOVERSION N/A 12/20/2015   Procedure: TRANSESOPHAGEAL ECHOCARDIOGRAM (TEE);  Surgeon: Dorothy Spark, MD;  Location: [facility] ENDOSCOPY;  Service: Cardiovascular;  Laterality: N/A;    Social History   Social History  . Marital status: Married    Spouse name: N/A  . Number of children: N/A  . Years of education: N/A   Occupational History  . Not on file.   Social History Main Topics  . Smoking status: Former Smoker    Types: Cigarettes    Quit date: 01/01/1991  . Smokeless tobacco: Never Used      Comment: " Quit around 1986"  . Alcohol use No  . Drug use: No  . Sexual activity: Yes    Birth control/ protection: None   Other Topics Concern  . Not on file   Social History Narrative  . No narrative on file    Allergies  Allergen Reactions  . Codeine Other (See Comments)    constipation  . Demerol [Meperidine] Other (See Comments)    Drops blood pressure   . Contrast Media [Iodinated Diagnostic Agents] Nausea And Vomiting  . Gabapentin Other (See Comments)    Suicidal, depression  . Other Other (See Comments)    All narcotics make pt hallucinate    Family History  Problem Relation Age of Onset  . Diabetes Mother   . Cancer Mother   . Hypertension Father   . Stroke Father   . Alcohol abuse Brother     Prior to Admission medications   Medication Sig Start Date End Date Taking? Authorizing Provider  apixaban (ELIQUIS) 5 MG TABS tablet Take 1 tablet (5 mg total) by mouth 2 (two) times daily. 12/24/15   Venetia Maxon Rama, MD  atorvastatin (LIPITOR) 40 MG tablet TAKE ONE TABLET BY MOUTH ONCE DAILY FOR CHOLESTEROL Patient taking differently: TAKE ONE TABLET BY MOUTH ONCE DAILY AT BEDTIME FOR CHOLESTEROL 11/20/15   Estill Dooms, MD  Cholecalciferol (VITAMIN D3) 5000 units TABS Take 5,000 Units by mouth at bedtime.    Historical Provider, MD  clopidogrel (PLAVIX) 75 MG tablet TAKE 1 TABLET BY MOUTH DAILY 07/04/15   Rosetta Posner, MD  dexamethasone (DECADRON) 10 MG/ML injection Inject 1 mL (10 mg total) into the vein every 6 (six) hours. 12/24/15   Venetia Maxon Rama, MD  diltiazem (CARDIZEM CD) 180 MG 24 hr capsule Take 1 capsule (180 mg total) by mouth daily. 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 12/24/15   Venetia Maxon Rama, MD  HYDROcodone-acetaminophen (NORCO/VICODIN) 5-325 MG tablet Take 0.5-1 tablets by mouth every 6 (six) hours as needed for moderate pain.     Historical Provider, MD  LORazepam (ATIVAN) 0.5 MG tablet Take 0.5 mg by mouth See admin instructions. Take 1 tablet (0.5 mg) by mouth every morning, may also take 1 tablet 2 more times during the day as needed for anxiety 11/21/15   Historical Provider, MD  nicotine polacrilex (COMMIT) 2 MG lozenge Take 2 mg by mouth every 2 (two) hours.     Historical Provider, MD  ondansetron (ZOFRAN) 4 MG tablet  Take 1 tablet (4 mg total) by mouth every 8 (eight) hours as needed for nausea or vomiting. 12/02/15   Lauree Chandler, NP  polyethylene glycol (MIRALAX / GLYCOLAX) packet Take 17 g by mouth daily. 12/25/15   Christina P Rama, MD  senna-docusate (SENOKOT-S) 8.6-50 MG tablet Take 1 tablet by mouth 2 (two) times daily. 12/24/15   Christina P Rama, MD  timolol (BETIMOL) 0.5 % ophthalmic solution Place 1 drop into both eyes 2 (two) times daily.    Historical Provider, MD  vancomycin 1,250 mg in sodium chloride 0.9 % 250 mL Inject 1,250 mg into the vein every 12 (twelve) hours. 12/24/15   Venetia Maxon Rama, MD    Physical Exam: There were no vitals filed for this visit.   General:  Appears calm and comfortable Eyes:  PERRL, EOMI, normal lids, iris ENT: Dry MM, dentures Neck:  no LAD, masses or thyromegaly Cardiovascular: faint heart sounds. RRR, II/VI systolic murmur. trace LE edema.  Respiratory: CTA bilaterally, no w/r/r. Normal respiratory effort. Abdomen:  soft, mild TTP of upper abdomen R-L, NABS Skin:  no rash or induration seen on limited exam Musculoskeletal: Weak LE w/ minimal movement, UE grossly normal tone, no bony abnormality Psychiatric:  grossly normal mood and affect, speech fluent and  appropriate, AOx3 Neurologic:  CN 2-12 grossly intact, moves all extremities in coordinated fashion, sensation intact  Labs on Admission: I have personally reviewed following labs and imaging studies  CBC:  Recent Labs Lab 12/19/15 0356 12/21/15 0543 12/25/15 0511 12/25/15 0700  WBC 7.6 7.9 8.4 6.8  NEUTROABS 7.0  --  8.1*  --   HGB 10.4* 11.5* 7.7* 7.4*  HCT 32.5* 35.3* 23.8* 22.7*  MCV 92.6 92.4 93.7 93.4  PLT 359 400 302 0000000   Basic Metabolic Panel:  Recent Labs Lab 12/19/15 0356 12/21/15 0543 12/23/15 0738 12/25/15 0511  NA 139 135  --  140  K 4.4 4.7  --  4.8  CL 107 102  --  108  CO2 26 27  --  25  GLUCOSE 145* 138*  --  132*  BUN 33* 31*  --  55*  CREATININE 0.77 0.70 0.64 0.75  CALCIUM 8.2* 7.9*  --  7.2*  MG 2.3  --   --   --    GFR: CrCl cannot be calculated (Unknown ideal weight.). Liver Function Tests:  Recent Labs Lab 12/19/15 0356 12/25/15 0511  AST 69* 29  ALT 37 20  ALKPHOS 114 67  BILITOT 0.3 0.6  PROT 5.4* 4.3*  ALBUMIN 1.5* 1.7*   No results for input(s): LIPASE, AMYLASE in the last 168 hours. No results for input(s): AMMONIA in the last 168 hours. Coagulation Profile: No results for input(s): INR, PROTIME in the last 168 hours. Cardiac Enzymes: No results for input(s): CKTOTAL, CKMB, CKMBINDEX, TROPONINI in the last 168 hours. BNP (last 3 results) No results for input(s): PROBNP in the last 8760 hours. HbA1C: No results for input(s): HGBA1C in the last 72 hours. CBG:  Recent Labs Lab 12/24/15 1642 12/24/15 2111 12/24/15 2328 12/25/15 0631 12/25/15 1159  GLUCAP 124* 132* 147* 140* 135*   Lipid Profile: No results for input(s): CHOL, HDL, LDLCALC, TRIG, CHOLHDL, LDLDIRECT in the last 72 hours. Thyroid Function Tests: No results for input(s): TSH, T4TOTAL, FREET4, T3FREE, THYROIDAB in the last 72 hours. Anemia Panel: No results for input(s): VITAMINB12, FOLATE, FERRITIN, TIBC, IRON, RETICCTPCT in the last 72 hours. Urine  analysis:    Component Value Date/Time  COLORURINE YELLOW 12/14/2015 1622   APPEARANCEUR CLOUDY (A) 12/14/2015 1622   APPEARANCEUR Clear 12/20/2014 1041   LABSPEC 1.020 12/14/2015 1622   PHURINE 7.0 12/14/2015 1622   GLUCOSEU NEGATIVE 12/14/2015 1622   HGBUR MODERATE (A) 12/14/2015 1622   BILIRUBINUR NEGATIVE 12/14/2015 1622   BILIRUBINUR Negative 12/20/2014 1041   KETONESUR NEGATIVE 12/14/2015 1622   PROTEINUR 100 (A) 12/14/2015 1622   UROBILINOGEN 0.2 06/06/2014 1332   NITRITE POSITIVE (A) 12/14/2015 1622   LEUKOCYTESUR SMALL (A) 12/14/2015 1622   LEUKOCYTESUR 3+ (A) 12/20/2014 1041    Creatinine Clearance: CrCl cannot be calculated (Unknown ideal weight.).  Sepsis Labs: @LABRCNTIP (procalcitonin:4,lacticidven:4) ) Recent Results (from the past 240 hour(s))  Culture, blood (Routine X 2) w Reflex to ID Panel     Status: None   Collection Time: 12/17/15  7:29 PM  Result Value Ref Range Status   Specimen Description BLOOD LEFT HAND  Final   Special Requests BOTTLES DRAWN AEROBIC ONLY 5CC  Final   Culture NO GROWTH 5 DAYS  Final   Report Status 12/22/2015 FINAL  Final  Culture, blood (Routine X 2) w Reflex to ID Panel     Status: None   Collection Time: 12/17/15  7:45 PM  Result Value Ref Range Status   Specimen Description BLOOD LEFT ANTECUBITAL  Final   Special Requests BOTTLES DRAWN AEROBIC ONLY 5CC  Final   Culture NO GROWTH 5 DAYS  Final   Report Status 12/22/2015 FINAL  Final  Culture, expectorated sputum-assessment     Status: None   Collection Time: 12/18/15  9:44 AM  Result Value Ref Range Status   Specimen Description EXPECTORATED SPUTUM  Final   Special Requests NONE  Final   Sputum evaluation   Final    THIS SPECIMEN IS ACCEPTABLE. RESPIRATORY CULTURE REPORT TO FOLLOW.   Report Status 12/18/2015 FINAL  Final  Culture, respiratory (NON-Expectorated)     Status: None   Collection Time: 12/18/15  9:44 AM  Result Value Ref Range Status   Specimen Description  EXPECTORATED SPUTUM  Final   Special Requests NONE  Final   Gram Stain   Final    ABUNDANT WBC PRESENT, PREDOMINANTLY PMN FEW BUDDING YEAST SEEN RARE GRAM POSITIVE COCCI IN PAIRS RARE GRAM NEGATIVE COCCOBACILLI    Culture   Final    RARE CANDIDA ALBICANS RARE METHICILLIN RESISTANT STAPHYLOCOCCUS AUREUS    Report Status 12/21/2015 FINAL  Final   Organism ID, Bacteria METHICILLIN RESISTANT STAPHYLOCOCCUS AUREUS  Final      Susceptibility   Methicillin resistant staphylococcus aureus - MIC*    CIPROFLOXACIN <=0.5 SENSITIVE Sensitive     ERYTHROMYCIN <=0.25 SENSITIVE Sensitive     GENTAMICIN <=0.5 SENSITIVE Sensitive     OXACILLIN <=0.25 RESISTANT Resistant     TETRACYCLINE <=1 SENSITIVE Sensitive     VANCOMYCIN 1 SENSITIVE Sensitive     TRIMETH/SULFA <=10 SENSITIVE Sensitive     CLINDAMYCIN <=0.25 SENSITIVE Sensitive     RIFAMPIN <=0.5 SENSITIVE Sensitive     Inducible Clindamycin NEGATIVE Sensitive     * RARE METHICILLIN RESISTANT STAPHYLOCOCCUS AUREUS  Fungus Culture With Stain     Status: None (Preliminary result)   Collection Time: 12/18/15 11:48 AM  Result Value Ref Range Status   Fungus Stain Final report  Final    Comment: (NOTE) Performed At: [facility] [place], [place] [id number] Lindon Romp MD [id number]    Fungus (Mycology) Culture PENDING  Incomplete   Fungal Source ASPIRATE  Final    Comment:  RLL POSTERIOR MEDIASTINAL MASS AT T7   Acid Fast Smear (AFB)     Status: None   Collection Time: 12/18/15 11:48 AM  Result Value Ref Range Status   AFB Specimen Processing Concentration  Final   Acid Fast Smear Negative  Final    Comment: (NOTE) Performed At: [facility] [place], [place] [id number] Lindon Romp MD [id number]    Source (AFB) ASPIRATE  Final    Comment:  RLL POSTERIOR MEDIASTINAL MASS AT T7   Aerobic/Anaerobic Culture (surgical/deep wound)     Status: None   Collection Time: 12/18/15  11:48 AM  Result Value Ref Range Status   Specimen Description ASPIRATE  Final   Special Requests RLL POSTERIOR MEDIASTINAL MASS AT T7  Final   Gram Stain   Final    RARE WBC PRESENT, PREDOMINANTLY PMN NO ORGANISMS SEEN    Culture FEW STAPHYLOCOCCUS AUREUS NO ANAEROBES ISOLATED   Final   Report Status 12/23/2015 FINAL  Final   Organism ID, Bacteria STAPHYLOCOCCUS AUREUS  Final      Susceptibility   Staphylococcus aureus - MIC*    CIPROFLOXACIN <=0.5 SENSITIVE Sensitive     ERYTHROMYCIN <=0.25 SENSITIVE Sensitive     GENTAMICIN <=0.5 SENSITIVE Sensitive     OXACILLIN <=0.25 SENSITIVE Sensitive     TETRACYCLINE <=1 SENSITIVE Sensitive     VANCOMYCIN 1 SENSITIVE Sensitive     TRIMETH/SULFA <=10 SENSITIVE Sensitive     CLINDAMYCIN <=0.25 SENSITIVE Sensitive     RIFAMPIN <=0.5 SENSITIVE Sensitive     Inducible Clindamycin NEGATIVE Sensitive     * FEW STAPHYLOCOCCUS AUREUS  Fungus Culture Result     Status: None   Collection Time: 12/18/15 11:48 AM  Result Value Ref Range Status   Result 1 Comment  Final    Comment: (NOTE) KOH/Calcofluor preparation:  no fungus observed. Performed At: [facility] [place], [place] [id number] Lindon Romp MD [id number]      Radiological Exams on Admission: No results found.  EKG: Independently reviewed. 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 Currently 7.4. Normocytic. CIR staff report dark stools for 24 hrs. 3W staff from previous department report nml stools. Likely  secondary to upper GI bleed from initiation of Eliquis. Cannot r/o ulceration given recent acute stress and high dose steroids.  - 2 units PRBC per CIR team - f/u post transfusion labs - Hold Plavix and Eliquis (resume per GI and Cards recs)  GI bleed: likely upper GI bleed. GI following. Anticipate EGD on 12/26/15. H/o nml colonoscopy. 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 The patient underwent CT-guided core biopsy of right  lower lobe/posterior mediastinal mass lesion 12/18/15. No evidence of malignancy of final pathology report. Blood cultures done on admission positive for MSSA, with surveillance blood cultures negative so far. Antibiotics switched to vancomycin 12/22/15 after sputum cultures grew MRSA. PICC line Placed 12/21/15. TEE done 12/20/15, negative for endocarditis. CIR admitted pt on 10/24 but then DC on 10/25 due to GI bleed - see above.   - 2 weeks of vancomycin and a switch of his antibiotics back to Cefazolin to complete a 6 week course of antibiotics - Per ID recs.   - Vanc 10/21>>>  - Continue Decadron for myelopathy from compression per Neursurgical recs. - Wean if able as LE numbness and strength retutn.  - Repeat MRI - consider reconsult to Neurosurgery based on results.  - PT/OT  Urinary retention: foley in place. Likely from T7 compression fracture and infection - Continue foley. - outpt f/u w/ Urology - UA  Afib RVR/CAD: Currently rate controlled. Cards, Dr. Tamala Julian, consulted on day of re-admit due to significant cardiac history on ASA and plavix w/ GI bleed.  - f/u Cards recs regarding anticoagulation - current recommending  restarting Eliquis as monotherapy after GI bleed resolution.  - Tele - continue Dilt  Bilat LE DVT: LE duplex on 10/25 noted DVT in the right soleal veins and the left peroneal veins. Likely from prolonged hospitalization and being bedbound - Hold anticoagulation at this time due to GI bleed - no SCD - Consult vascular in AM for possible IVC filter.   Pain: secondary to T7 compression fracture, infection, GI bleed, and prolonged hospital stay being bed bound. - Fentanyl PRN - Intranasal calcitonin - Tylenol  Anxiety/Insomnia: - continue ativan - Trazodone QHS prn  Severe Protein calorie malnutrition: Albuimin1.7 and total protein 4.3. Poor appetite and oral intake since admission. Poor prognostic indicator for ability to heal - nutrition consult - Ensure TID between meals once taking PO - Pre-albumin  H/o TIA/CVA: no residual deficits. S/p carotid endarterectomy - DC plavix as above  Pressure ulcer to the sacrum Evaluated by wound care nurse. Foam dressings ordered. Pressure reduction mattress ordered. - Air overlay mattress  DM: technically pre-DM w/ A1c at 5.8% - SSI  HLD:  - continue statin    DVT prophylaxis: None  Code Status: full  Family Communication: wife  Disposition Plan: pending resolution of GI bleed and likely EGD - anticipate transfer back to CIR  Consults called: Cards, GI, (Reconsult neurosurgery pending MRI results)  Admission status: inpt    MERRELL, DAVID J MD [facility]  If 7PM-7AM, please contact night-coverage [email or web address] Password [facility]  12/25/2015, 5:34 PM

## 2015-12-25 NOTE — Progress Notes (Signed)
Patient complaining of abdominal pain and nausea. protonix IV given that was ordered but unable to give due to receiving blood transfusion via PICC line. Patient continues awaiting transfer to telemetry unit. Patient noted with elevated BP. Patient continues to refuse clear liquid diet with minimal fluid intake. Continue with plan of care.  Mliss Sax

## 2015-12-25 NOTE — Plan of Care (Signed)
Problem: SCI BOWEL ELIMINATION Goal: RH STG MANAGE BOWEL WITH ASSISTANCE STG Manage Bowel with max Assistance. Outcome: Not Progressing Total assist-incontinent

## 2015-12-25 NOTE — Progress Notes (Signed)
Morning labs with CBC of 7.7 repeated to confirm at 7.4. Prior hemoglobin 11.5 on 12/21/2015. Eliquis held this morning due to anemia. Order to guaiac stool. Plan to transfuse. Gastroenterology consulted for workup of anemia.

## 2015-12-25 NOTE — Progress Notes (Signed)
Occupational Therapy Note  Patient Details  Name: BLAKLEY LAVERDE MRN: [id number] Date of Birth: September 19, 1940  Attempted to complete OT evaluation. Pt currently on bedrest and receiving blood transfusion at time of arrival.  Formal OT assessment deferred until pt more medically appropriate. Extensive education with pt's wife regarding role of OT/PT, POC, CIR, SCI, OT/PT goals, and d/c planning. Discussion with pt's wife regarding pt's PLOF, home layout, and support at d/c. Attempted to educate with pt, however, pt voiced feeling overwhelmed by increased stimuli (nursing and PA in/out of room during this time) and requested to have therapist talk with wife outside of room.   Lewis, Sanjuanita Condrey C 12/25/2015, 1:39 PM

## 2015-12-26 ENCOUNTER — Inpatient Hospital Stay (HOSPITAL_COMMUNITY): Payer: Medicare Other | Admitting: Occupational Therapy

## 2015-12-26 ENCOUNTER — Encounter (HOSPITAL_COMMUNITY): Payer: Self-pay | Admitting: General Practice

## 2015-12-26 ENCOUNTER — Telehealth: Payer: Self-pay | Admitting: Radiation Oncology

## 2015-12-26 ENCOUNTER — Inpatient Hospital Stay (HOSPITAL_COMMUNITY): Payer: Medicare Other

## 2015-12-26 ENCOUNTER — Ambulatory Visit: Payer: Medicare Other

## 2015-12-26 DIAGNOSIS — K922 Gastrointestinal hemorrhage, unspecified: Secondary | ICD-10-CM

## 2015-12-26 DIAGNOSIS — G729 Myopathy, unspecified: Secondary | ICD-10-CM

## 2015-12-26 DIAGNOSIS — R339 Retention of urine, unspecified: Secondary | ICD-10-CM

## 2015-12-26 DIAGNOSIS — R29898 Other symptoms and signs involving the musculoskeletal system: Secondary | ICD-10-CM

## 2015-12-26 DIAGNOSIS — G8220 Paraplegia, unspecified: Secondary | ICD-10-CM

## 2015-12-26 DIAGNOSIS — L89159 Pressure ulcer of sacral region, unspecified stage: Secondary | ICD-10-CM

## 2015-12-26 DIAGNOSIS — I4891 Unspecified atrial fibrillation: Secondary | ICD-10-CM

## 2015-12-26 DIAGNOSIS — A4101 Sepsis due to Methicillin susceptible Staphylococcus aureus: Secondary | ICD-10-CM

## 2015-12-26 DIAGNOSIS — R2689 Other abnormalities of gait and mobility: Secondary | ICD-10-CM

## 2015-12-26 DIAGNOSIS — D62 Acute posthemorrhagic anemia: Secondary | ICD-10-CM

## 2015-12-26 DIAGNOSIS — S22069G Unspecified fracture of T7-T8 vertebra, subsequent encounter for fracture with delayed healing: Secondary | ICD-10-CM

## 2015-12-26 DIAGNOSIS — I10 Essential (primary) hypertension: Secondary | ICD-10-CM

## 2015-12-26 DIAGNOSIS — K921 Melena: Secondary | ICD-10-CM

## 2015-12-26 LAB — COMPREHENSIVE METABOLIC PANEL
ALBUMIN: 1.8 g/dL — AB (ref 3.5–5.0)
ALK PHOS: 60 U/L (ref 38–126)
ALT: 22 U/L (ref 17–63)
AST: 32 U/L (ref 15–41)
Anion gap: 4 — ABNORMAL LOW (ref 5–15)
BILIRUBIN TOTAL: 1 mg/dL (ref 0.3–1.2)
BUN: 63 mg/dL — AB (ref 6–20)
CALCIUM: 7.2 mg/dL — AB (ref 8.9–10.3)
CO2: 24 mmol/L (ref 22–32)
Chloride: 110 mmol/L (ref 101–111)
Creatinine, Ser: 0.84 mg/dL (ref 0.61–1.24)
GFR calc Af Amer: >60 mL/min (ref >60)
GFR calc non Af Amer: >60 mL/min (ref >60)
GLUCOSE: 139 mg/dL — AB (ref 65–99)
POTASSIUM: 4.5 mmol/L (ref 3.5–5.1)
Sodium: 138 mmol/L (ref 135–145)
TOTAL PROTEIN: 3.8 g/dL — AB (ref 6.5–8.1)

## 2015-12-26 LAB — URINALYSIS, ROUTINE W REFLEX MICROSCOPIC
Bilirubin Urine: NEGATIVE
Glucose, UA: NEGATIVE mg/dL
Ketones, ur: NEGATIVE mg/dL
LEUKOCYTES UA: NEGATIVE
NITRITE: NEGATIVE
PROTEIN: NEGATIVE mg/dL
Specific Gravity, Urine: 1.025 (ref 1.005–1.030)
pH: 6 (ref 5.0–8.0)

## 2015-12-26 LAB — CBC
HEMATOCRIT: 25.1 % — AB (ref 39.0–52.0)
HEMOGLOBIN: 8.7 g/dL — AB (ref 13.0–17.0)
MCH: 31 pg (ref 26.0–34.0)
MCHC: 34.7 g/dL (ref 30.0–36.0)
MCV: 89.3 fL (ref 78.0–100.0)
Platelets: 206 10*3/uL (ref 150–400)
RBC: 2.81 MIL/uL — AB (ref 4.22–5.81)
RDW: 16.9 % — ABNORMAL HIGH (ref 11.5–15.5)
WBC: 9.3 10*3/uL (ref 4.0–10.5)

## 2015-12-26 LAB — GLUCOSE, CAPILLARY
GLUCOSE-CAPILLARY: 131 mg/dL — AB (ref 65–99)
GLUCOSE-CAPILLARY: 204 mg/dL — AB (ref 65–99)
GLUCOSE-CAPILLARY: 151 mg/dL — AB (ref 65–99)
Glucose-Capillary: 166 mg/dL — ABNORMAL HIGH (ref 65–99)
Glucose-Capillary: 163 mg/dL — ABNORMAL HIGH (ref 65–99)

## 2015-12-26 LAB — HEMOGLOBIN AND HEMATOCRIT, BLOOD
HCT: 26.2 % — ABNORMAL LOW (ref 39.0–52.0)
Hemoglobin: 8.8 g/dL — ABNORMAL LOW (ref 13.0–17.0)

## 2015-12-26 LAB — URINE MICROSCOPIC-ADD ON

## 2015-12-26 MED ORDER — ONDANSETRON HCL 4 MG/2ML IJ SOLN
4.0000 mg | Freq: Four times a day (QID) | INTRAMUSCULAR | Status: DC | PRN
  Administered 2015-12-26 – 2016-01-09 (×8): 4 mg via INTRAVENOUS
  Filled 2015-12-26 (×10): qty 2

## 2015-12-26 MED ORDER — SODIUM CHLORIDE 0.9 % IV SOLN: INTRAVENOUS | Status: DC

## 2015-12-26 MED ORDER — PANTOPRAZOLE SODIUM 40 MG IV SOLR: 40.0000 mg | Freq: Two times a day (BID) | INTRAVENOUS | Status: DC

## 2015-12-26 MED ORDER — TRAZODONE HCL 50 MG PO TABS
50.0000 mg | ORAL_TABLET | Freq: Every evening | ORAL | Status: DC | PRN
  Administered 2015-12-26 – 2016-01-06 (×8): 50 mg via ORAL
  Filled 2015-12-26 (×8): qty 1

## 2015-12-26 MED ORDER — SODIUM CHLORIDE 0.9 % IV SOLN
80.0000 mg | Freq: Once | INTRAVENOUS | Status: AC
  Administered 2015-12-26: 80 mg via INTRAVENOUS
  Filled 2015-12-26: qty 80

## 2015-12-26 MED ORDER — LORAZEPAM 0.5 MG PO TABS
0.5000 mg | ORAL_TABLET | Freq: Every day | ORAL | Status: DC
  Administered 2015-12-26 – 2016-01-01 (×7): 0.5 mg via ORAL
  Filled 2015-12-26 (×8): qty 1

## 2015-12-26 MED ORDER — BOOST / RESOURCE BREEZE PO LIQD
1.0000 | Freq: Three times a day (TID) | ORAL | Status: DC
  Administered 2015-12-28 – 2015-12-31 (×3): 1 via ORAL

## 2015-12-26 MED ORDER — DILTIAZEM HCL ER COATED BEADS 180 MG PO CP24
180.0000 mg | ORAL_CAPSULE | Freq: Every day | ORAL | Status: DC
  Administered 2015-12-26 – 2016-01-08 (×12): 180 mg via ORAL
  Filled 2015-12-26 (×13): qty 1

## 2015-12-26 MED ORDER — LORAZEPAM 2 MG/ML IJ SOLN
1.0000 mg | Freq: Once | INTRAMUSCULAR | Status: AC | PRN
  Administered 2015-12-26: 1 mg via INTRAVENOUS
  Filled 2015-12-26: qty 1

## 2015-12-26 MED ORDER — DEXAMETHASONE SODIUM PHOSPHATE 10 MG/ML IJ SOLN
10.0000 mg | Freq: Two times a day (BID) | INTRAMUSCULAR | Status: DC
  Administered 2015-12-26 – 2015-12-28 (×4): 10 mg via INTRAVENOUS
  Filled 2015-12-26 (×4): qty 1

## 2015-12-26 MED ORDER — PANTOPRAZOLE SODIUM 40 MG IV SOLR
8.0000 mg/h | INTRAVENOUS | Status: AC
  Administered 2015-12-26 – 2015-12-28 (×5): 8 mg/h via INTRAVENOUS
  Filled 2015-12-26 (×15): qty 80

## 2015-12-26 MED ORDER — ENSURE ENLIVE PO LIQD: 237.0000 mL | Freq: Two times a day (BID) | ORAL | Status: DC | PRN

## 2015-12-26 MED ORDER — LORAZEPAM 0.5 MG PO TABS
0.5000 mg | ORAL_TABLET | Freq: Two times a day (BID) | ORAL | Status: DC | PRN
  Administered 2015-12-30 (×2): 0.5 mg via ORAL
  Filled 2015-12-26 (×2): qty 1

## 2015-12-26 MED ORDER — SODIUM CHLORIDE 0.9% FLUSH: 10.0000 mL | INTRAVENOUS | Status: DC | PRN

## 2015-12-26 NOTE — Progress Notes (Signed)
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 right handed male with history of urethral stricture, TIA on Plavix, carotid stenosis status post CEA, hypertension, diabetes mellitus, tremor.Per chart review patient lives with spouse. One level home. Wife works during the day. Patient works full-time as a used Barrister's clerk.  He presented 12/14/2015 with generalized weakness failure to thrive after recent fall.Patient found to be in atrial fibrillation with RVR.  Initial chest x-ray evaluation revealed community-acquired pneumonia as well as findings of right lower lobe mass, closed fracture of T7 vertebrae. CT of the chest showed rounded masslike consolidation in the medial aspect of the superior segment right lower lobe measuring 4.7 x 4.9 cm new from 07/07/2013. MRI thoracic spine showed abnormal segment and contrast enhancement of T6 and T7 vertebral bodies with associated T7 compression fracturewith associated discitis -osteomyelitis  most likely secondary to direct extension of the process within the right lower lobe. Cranial CT scan negative for acute changes. Follow-up neurosurgery Dr. Annette Stable with review of MRI thoracic spine no plan for surgical intervention at this time. CT-guided biopsy performed 12/18/2015 with results pending. Radiation oncology consulted in regards to findings of mass plan to begin simulation and radiation treatment.Infectious disease  consulted for [name] with osteomyelitis. Echocardiogram without any vegetation. Await plan for TEE. Currently remains on Cefazolin. Cardiology continues to follow for atrial fibrillation with RVR status post conversion normal sinus rhythm after being maintained on Cardizem drip. Plan anticoagulation once all invasive procedures are completed.   Review of Systems  Constitutional: Positive for malaise/fatigue. Negative for chills and fever.  HENT: Negative for hearing loss.   Eyes: Negative for blurred vision and double vision.  Respiratory: Positive for cough. Negative for shortness of breath.   Cardiovascular: Negative for chest pain, palpitations and leg swelling.  Gastrointestinal: Positive for constipation. Negative for nausea and vomiting.  Genitourinary: Positive for frequency and urgency.  Musculoskeletal: Positive for back pain and myalgias.  Skin: Negative for rash.  Neurological: Positive for weakness. Negative for seizures and headaches.  All other systems reviewed and are negative.      Past Medical History:  Diagnosis Date  . Anxiety state, unspecified   . Calculus of kidney   . 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 Complication of anesthesia    " I woke up in the recovery room with tube in throat and panicked."  . 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 Herpes zoster without mention of complication   . History of hiatal hernia   . Hypertrophy of prostate with urinary obstruction and other lower urinary tract symptoms (LUTS)   . Insomnia, unspecified   . Lumbago   . Other and unspecified hyperlipidemia   . Pneumonia    bronchial  . Reflux esophagitis   . Seasonal allergies   . Stroke ([place])   . TIA (transient ischemic attack)   . Type II or unspecified type diabetes mellitus without mention of complication, not stated as uncontrolled   .  Unspecified essential hypertension   . Unspecified vitamin D deficiency   . Urethral  stricture unspecified   . Urinary frequency         Past Surgical History:  Procedure Laterality Date  . CARDIAC CATHETERIZATION  1986   normal, Dr Lia Foyer  . ENDARTERECTOMY Left 06/08/2014   Procedure: LEFT CAROTID ARTERY ENDARTERECTOMY;  Surgeon: Rosetta Posner, MD;  Location: [facility];  Service: Vascular;  Laterality: Left;  . EYE SURGERY  2014   cataract  . EYE SURGERY  2013   Retina  . PATCH ANGIOPLASTY Left 06/08/2014   Procedure: WITH DACRON PATCH ANGIOPLASTY;  Surgeon: Rosetta Posner, MD;  Location: [facility];  Service: Vascular;  Laterality: Left;  . SPINAL FUSION  12/91,11/91   obtained bone from left lower leg  . stimulator 1991     spine  . stress thallium  1994    normal;  . stretching bladder  07/2007   Dr Risa Grill        Family History  Problem Relation Age of Onset  . Diabetes Mother   . Cancer Mother   . Hypertension Father   . Stroke Father   . Alcohol abuse Brother    Social History:  reports that he quit smoking about 24 years ago. His smoking use included Cigarettes. He has never used smokeless tobacco. He reports that he does not drink alcohol or use drugs. Allergies:       Allergies  Allergen Reactions  . Codeine Other (See Comments)    constipation  . Demerol [Meperidine] Other (See Comments)    Drops blood pressure   . Contrast Media [Iodinated Diagnostic Agents] Nausea And Vomiting  . Gabapentin Other (See Comments)    Suicidal, depression  . Other Other (See Comments)    All narcotics make pt hallucinate         Medications Prior to Admission  Medication Sig Dispense Refill  . atorvastatin (LIPITOR) 40 MG tablet TAKE ONE TABLET BY MOUTH ONCE DAILY FOR CHOLESTEROL (Patient taking differently: TAKE ONE TABLET BY MOUTH ONCE DAILY AT BEDTIME FOR CHOLESTEROL) 30 tablet 3  . Cholecalciferol (VITAMIN D3) 5000 units TABS Take 5,000 Units by mouth at bedtime.    . clopidogrel (PLAVIX) 75 MG tablet TAKE 1 TABLET BY MOUTH DAILY  90 tablet 3  . 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 HYDROcodone-acetaminophen (NORCO/VICODIN) 5-325 MG tablet Take 0.5-1 tablets by mouth every 6 (six) hours as needed for moderate pain.     Marland Kitchen LORazepam (ATIVAN) 0.5 MG tablet Take 0.5 mg by mouth See admin instructions. Take 1 tablet (0.5 mg) by mouth every morning, may also take 1 tablet 2 more times during the day as needed for anxiety  0  . nicotine polacrilex (COMMIT) 2 MG lozenge Take 2 mg by mouth every 2 (two) hours.     . ondansetron (ZOFRAN) 4 MG tablet Take 1 tablet (4 mg total) by mouth every 8 (eight) hours as needed for nausea or vomiting. 20 tablet 0  . timolol (BETIMOL) 0.5 % ophthalmic solution Place 1 drop into both eyes 2 (two) times daily.    Marland Kitchen trimethoprim (TRIMPEX) 100 MG tablet Take 100 mg by mouth daily.    Marland Kitchen acetaminophen (TYLENOL) 500 MG tablet Take 1,000 mg  by mouth every 6 (six) hours as needed for mild pain or moderate pain.    . methocarbamol (ROBAXIN) 500 MG tablet Take 1 tablet (500 mg total) by mouth every 6 (six) hours as needed for muscle spasms. (Patient not taking: Reported on 12/14/2015) 120 tablet 0  . traMADol (ULTRAM) 50 MG tablet Take 1 tablet (50 mg total) by mouth every 6 (six) hours as needed. (Patient not taking: Reported on 12/14/2015) 15 tablet 0    Home: [place] expects to be discharged to:: Private residence Living Arrangements: Spouse/significant other Available Help at Discharge: Family, Available 24 hours/day Type of Home: House Home Access: Level entry Home Layout: One level Bathroom Shower/Tub: Tub/shower unit, Architectural technologist: Handicapped height Home Equipment: Environmental consultant - 2 wheels  Functional History: Prior Function Level of  Independence: Independent Comments: works full time as a used Financial controller Status:  Mobility: Bed Mobility Overal bed mobility: +2 for physical assistance, Needs Assistance Bed Mobility: Rolling, Sidelying to Sit, Sit to Sidelying Rolling: +2 for physical assistance, Max assist Sidelying to sit: +2 for physical assistance, Max assist Sit to sidelying: +2 for physical assistance, Max assist General bed mobility comments: +2 assist to roll to L side and into sitting position on EOB. Pt needing manual assist and cues for proper technique and hand placement.  Transfers General transfer comment: not attempted secondary to safety      ADL: ADL Overall ADL's : Needs assistance/impaired Lower Body Dressing: Total assistance General ADL Comments: Total A to don footwear. Pt needing +2 assist for pt and therapist safety. Transfers not attempted this session. Pt with increased pain with pt movement.  Cognition: Cognition Overall Cognitive Status: Impaired/Different from baseline Orientation Level: Oriented to person, Disoriented to place, Disoriented to time, Disoriented to situation Cognition Arousal/Alertness: Awake/alert Behavior During Therapy: WFL for tasks assessed/performed Overall Cognitive Status: Impaired/Different from baseline Area of Impairment: Memory, Orientation Orientation Level: Time Memory: Decreased short-term memory General Comments: Cognitive deficits are likely due to sedatives and pain meds from bx earlier today.  Daughters report normally his cognition is fine.   Blood pressure (!) 141/78, pulse (!) 57, temperature 97.7 F (36.5 C), temperature source Oral, resp. rate 16, height 5' 7.5" (1.715 m), weight 97.1 kg (214 lb 1.6 oz), SpO2 97 %. Physical Exam  HENT:  Head: Normocephalic.  Eyes: EOM are normal.  Neck: Normal range of motion. Neck supple. No thyromegaly present.  Cardiovascular: Normal rate and regular rhythm.   Respiratory: Effort  normal and breath sounds normal. No respiratory distress.  GI: Soft. Bowel sounds are normal. He exhibits no distension.  Neurological: He is alert.  Oriented to person, place and time. He does show some mild delay in processing. Follows simple commands. T6 sensory level. Has minimal LT below level. I saw no voluntary muscle movement in the LE's. Toes up bilaterally. DTR's 3+ patella/achilles bilaterally. 1/4 resting extensor tone in LE's. UE strength 4 to 4+/5. UE sensation normal.  Psychiatric: He has a normal mood and affect. His behavior is normal.    Lab Results Last 24 Hours        Results for orders placed or performed during the hospital encounter of 12/14/15 (from the past 24 hour(s))  Culture, expectorated sputum-assessment     Status: None   Collection Time: 12/18/15  9:44 AM  Result Value Ref Range   Specimen Description EXPECTORATED SPUTUM    Special Requests NONE    Sputum evaluation  THIS SPECIMEN IS ACCEPTABLE. RESPIRATORY CULTURE REPORT TO FOLLOW.   Report Status 12/18/2015 FINAL   Culture, respiratory (NON-Expectorated)     Status: None (Preliminary result)   Collection Time: 12/18/15  9:44 AM  Result Value Ref Range   Specimen Description EXPECTORATED SPUTUM    Special Requests NONE    Gram Stain      ABUNDANT WBC PRESENT, PREDOMINANTLY PMN FEW BUDDING YEAST SEEN RARE GRAM POSITIVE COCCI IN PAIRS RARE GRAM NEGATIVE COCCOBACILLI   Culture PENDING    Report Status PENDING   Aerobic/Anaerobic Culture (surgical/deep wound)     Status: None (Preliminary result)   Collection Time: 12/18/15 11:48 AM  Result Value Ref Range   Specimen Description ASPIRATE    Special Requests RLL POSTERIOR MEDIASTINAL MASS AT T7    Gram Stain      RARE WBC PRESENT, PREDOMINANTLY PMN NO ORGANISMS SEEN   Culture PENDING    Report Status PENDING   Digoxin level     Status: None   Collection Time: 12/19/15  3:56 AM  Result Value Ref Range     Digoxin Level 0.9 0.8 - 2.0 ng/mL  CBC with Differential/Platelet     Status: Abnormal   Collection Time: 12/19/15  3:56 AM  Result Value Ref Range   WBC 7.6 4.0 - 10.5 K/uL   RBC 3.51 (L) 4.22 - 5.81 MIL/uL   Hemoglobin 10.4 (L) 13.0 - 17.0 g/dL   HCT 32.5 (L) 39.0 - 52.0 %   MCV 92.6 78.0 - 100.0 fL   MCH 29.6 26.0 - 34.0 pg   MCHC 32.0 30.0 - 36.0 g/dL   RDW 14.0 11.5 - 15.5 %   Platelets 359 150 - 400 K/uL   Neutrophils Relative % 92 %   Neutro Abs 7.0 1.7 - 7.7 K/uL   Lymphocytes Relative 6 %   Lymphs Abs 0.5 (L) 0.7 - 4.0 K/uL   Monocytes Relative 2 %   Monocytes Absolute 0.1 0.1 - 1.0 K/uL   Eosinophils Relative 0 %   Eosinophils Absolute 0.0 0.0 - 0.7 K/uL   Basophils Relative 0 %   Basophils Absolute 0.0 0.0 - 0.1 K/uL  Comprehensive metabolic panel     Status: Abnormal   Collection Time: 12/19/15  3:56 AM  Result Value Ref Range   Sodium 139 135 - 145 mmol/L   Potassium 4.4 3.5 - 5.1 mmol/L   Chloride 107 101 - 111 mmol/L   CO2 26 22 - 32 mmol/L   Glucose, Bld 145 (H) 65 - 99 mg/dL   BUN 33 (H) 6 - 20 mg/dL   Creatinine, Ser 0.77 0.61 - 1.24 mg/dL   Calcium 8.2 (L) 8.9 - 10.3 mg/dL   Total Protein 5.4 (L) 6.5 - 8.1 g/dL   Albumin 1.5 (L) 3.5 - 5.0 g/dL   AST 69 (H) 15 - 41 U/L   ALT 37 17 - 63 U/L   Alkaline Phosphatase 114 38 - 126 U/L   Total Bilirubin 0.3 0.3 - 1.2 mg/dL   GFR calc non Af Amer >60 >60 mL/min   GFR calc Af Amer >60 >60 mL/min   Anion gap 6 5 - 15  Magnesium     Status: None   Collection Time: 12/19/15  3:56 AM  Result Value Ref Range   Magnesium 2.3 1.7 - 2.4 mg/dL      Imaging Results (Last 48 hours)  Dg Chest 2 View  Result Date: 12/17/2015 CLINICAL DATA:  Shortness of breath, left-sided chest  pain EXAM: CHEST  2 VIEW COMPARISON:  CT chest of 12/14/2015, chest x-ray of 12/14/2015, and two-view chest x-ray of 11/12/2015 FINDINGS: There is abnormal opacity overlying the right hilum and  suprahilar region consistent with the soft tissue mass described on recent CT of the chest. In view of the rapid development since chest x-ray of 11/12/2015, an infectious or inflammatory process would be more likely than neoplasm. No pneumonia is seen and there is a small right pleural effusion present. The heart is within upper limits normal. Partial compression of T8 and the anterior inferior aspect of T7 is noted which could also either be infectious, inflammatory, or neoplastic in origin. IMPRESSION: 1. Abnormal opacity overlies the right hilum/suprahilar region consistent with the previously described soft tissue mass involving the posterior medial right lower lobe and thoracic vertebral column. Favor infectious or inflammatory process as noted above. 2. Small right pleural effusion. Electronically Signed   By: Ivar Drape M.D.   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 He also has new onset paraplegia. CT-guided biopsy is warranted to obtain tissue diagnosis. EXAM: CT GUIDANCE NEEDLE PLACEMENT MEDICATIONS: None. ANESTHESIA/SEDATION: Moderate (conscious) sedation was employed during this procedure. A total of Versed 2 mg and Fentanyl 100 mcg was administered intravenously. Moderate Sedation Time: 14 minutes. The patient's level of consciousness and vital signs were monitored continuously by radiology nursing throughout the procedure under my direct supervision. FLUOROSCOPY TIME:  Fluoroscopy Time: 0 minutes 0 seconds (0 mGy). COMPLICATIONS: None immediate. PROCEDURE: Informed written consent was obtained from the patient after a thorough discussion of the procedural risks, benefits and alternatives. All questions were addressed. Maximal Sterile Barrier Technique was utilized including caps, mask, sterile gowns, sterile gloves, sterile  drape, hand hygiene and skin antiseptic. A timeout was performed prior to the initiation of the procedure. A planning axial CT scan was performed. The infiltrative mass along the medial aspect of the right lower lobe directly invading the posterior mediastinum was successfully identified. A suitable skin entry site was selected and marked. The region was sterilely prepped and draped in the standard fashion using chlorhexidine skin prep. Local anesthesia was attained by infiltration with 1% lidocaine. A small dermatotomy was made. Under intermittent CT guidance, a 17 gauge introducer needle was advanced into the margin of the soft tissue mass. Multiple 18 gauge core biopsies were then coaxially obtained using the bio Pince automated biopsy device. Biopsy specimens were placed in saline and delivered to pathology for further analysis. Additional 20 gauge needle aspirate was performed and inject into a small amount of sterile saline for cultures. Post biopsy axial CT imaging demonstrates no evidence of pneumothorax, hemorrhage or other complicating feature. IMPRESSION: Technically successful CT-guided core biopsy of right lower lobe/ posterior mediastinal mass lesion. Signed, Criselda Peaches, MD [facility] Electronically Signed   By: Jacqulynn Cadet M.D.   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 Does the need for close, 24 hr/day medical supervision in concert with the patient's rehab needs make it unreasonable for this patient to be served in a less intensive setting? Yes 2. Co-Morbidities requiring supervision/potential complications: CAP, afib, pain 3. Due to bladder management, bowel management, safety, skin/wound care, disease management, medication administration, pain management and  patient education, does the  patient require 24 hr/day rehab nursing? Yes 4. Does the patient require coordinated care of a physician, rehab nurse, PT (1-2 hrs/day, 5 days/week) and OT (1-2 hrs/day, 5 days/week) to address physical and functional deficits in the context of the above medical diagnosis(es)? Yes Addressing deficits in the following areas: balance, endurance, locomotion, strength, transferring, bowel/bladder control, bathing, dressing, feeding, grooming, toileting, cognition and psychosocial support 5. Can the patient actively participate in an intensive therapy program of at least 3 hrs of therapy per day at least 5 days per week? Yes 6. The potential for patient to make measurable gains while on inpatient rehab is excellent 7. Anticipated functional outcomes upon discharge from inpatient rehab are supervision and min assist  with PT, supervision and min assist with OT, n/a with SLP. 8. Estimated rehab length of stay to reach the above functional goals is: 18-22 days 9. Does the patient have adequate social supports and living environment to accommodate these discharge functional goals? Yes 10. Anticipated D/C setting: Home 11. Anticipated post D/C treatments: [place] therapy 12. Overall Rehab/Functional Prognosis: excellent  RECOMMENDATIONS: This patient's condition is appropriate for continued rehabilitative care in the following setting: CIR Patient has agreed to participate in recommended program. Yes Note that insurance prior authorization may be required for reimbursement for recommended care.  Comment: Medical/onc work up ongoing. Pt was active and working full time prior to this hospitalization. Rehab Admissions Coordinator to follow up.  Thanks,  Marcus Staggers, MD, [facility]     12/19/2015    Revision History              Routing History

## 2015-12-26 NOTE — Progress Notes (Signed)
PROGRESS NOTE    Marcus [name]  [id number] DOB: 1940-10-09 DOA: 12/25/2015 PCP: Jeanmarie Hubert, MD  Brief Narrative:  Marcus [name] is a 75 y.o. 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 Patient's current complaints include intermittent pulling or burning sensation of his epigastric and right upper and left upper quadrants. Sometimes describes the pain as a rope pulling him tight sensation. Worse with certain movements and deep breathing. Sometimes comes on as a crampy sensation that happens spontaneously. Pain typically lasts a few seconds. Per review of patient's recent care on the third floor and discussion of staff there are no reported dark or loose bloody stools prior to being transferred to CIR. Staff on CIR note dark to black stools for the last 24 hours. Denies any other new symptoms such as fevers, cough, shortness of breath, rash, palpitations, headache, neck stiffness. Symptoms appear to be getting worse. EGD planned for Am.  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 Currently 7.4. Normocytic. CIR staff report dark stools for 24 hrs. 3W staff from previous department report nml stools. Likely secondary to upper GI bleed from initiation of Eliquis.  -Cannot r/o ulceration given recent acute stress and high dose steroids. Will attempt to wean Steroids. Decadron 10 mg IV q6h now 10 mg IV q12h.  - 2 units PRBC per CIR team -  f/u post transfusion labs - Hold Plavix and Eliquis (resume per GI and Cards recs)  GI bleed:  - Likely upper GI bleed. GI following. Anticipate EGD on 12/27/15. H/o nml colonoscopy. 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 The patient underwent CT-guided core biopsy of right lower lobe/posterior mediastinal mass lesion 12/18/15. No evidence of malignancy of final pathology report. Likely Inflammation and Fibrosis -Blood cultures done on admission positive for MSSA, with surveillance blood cultures negative so far. Antibiotics switched to vancomycin 12/22/15 after sputum cultures grew MRSA. PICC line Placed 12/21/15.  -TEE done 12/20/15, negative for endocarditis.  -CIR admitted pt on 10/24 but then DC on 10/25 due to GI bleed - see above.   - 2weeks of vancomycin and a switch of his antibiotics back to Cefazolin to complete a 6 week course of antibiotics - Per ID recs.  - Vanc 10/21 --> -Continue Decadron for myelopathy from compression per Neursurgical recs. - Weaning 10 mg as patient has concern for GI Bleed - Repeat MRI pending - consider reconsult to Neurosurgery based on results.  - PT/OT  Urinary retention: -Foley in place. Likely from T7 compression fracture and infection - Continue foley. -  outpt f/u w/ Urology - UA  Afib RVR/CAD:  -Currently rate controlled.  -Cards, Dr. Tamala Julian, consulted on day of re-admit due to significant cardiac history on ASA and plavix w/ GI bleed.  - F/u Cards recs regarding anticoagulation - current recommending restarting Eliquis as monotherapy after GI bleed  resolution.  - Tele - Continue Diltiazem 180 mg daily  Bilat LE DVT:  -LE duplex on 10/25 noted DVT in the right soleal veins and the left peroneal veins. Likely from prolonged hospitalization and being bedbound - Hold anticoagulation at this time due to GI bleed - no SCD - Discussed with IR Dr. Anselm Pancoast and he recommended waiting on full GI evaluation prior to IVC Filter placement.   Pain: secondary to T7 compression fracture, infection, GI bleed, and prolonged hospital stay being bed bound. - Fentanyl PRN - Intranasal calcitonin - Tylenol  Anxiety/Insomnia: - continue Ativan - Trazodone QHS prn  Severe Protein calorie malnutrition:  -Albuimin1.7 and total protein 4.3.  -Poor appetite and oral intake since admission.  -Poor prognostic indicator for ability to heal - Nutrition consult - Ensure TID between meals once taking PO - Pre-albumin  H/o TIA/CVA:  - No residual deficits. S/p carotid endarterectomy - DC plavix as above  Pressure ulcer to the sacrum -Evaluated by wound care nurse. Foam dressings ordered.  -Pressure reduction mattress ordered by admitting team. - Air overlay mattress  HLD:  - continue statin  DVT prophylaxis: None because of GI Bleed Risk and no SCD's because of DVT Code Status: Full Family Communication: Discussed plan of care with wife over the phone Disposition Plan: Inpatient Rehab  Consultants:   GI  Cardiology  IR by phone  Infectious Diseases  Procedures: EGD in AM  Antimicrobials: Vanc and Cefazolin after completion of Vanc  Subjective: Patient was seen and examined and in a tremendous amount of pain in Abdomen and stated he was worn out. Denied and CP or SOB. Stated he needed to rest. No other complaints or concerns.   Objective: Vitals:   12/25/15 2336 12/26/15 0500 12/26/15 0848  BP: 140/77 (!) 141/88 (!) 119/95  Pulse: (!) 59 70 62  Resp: 20 20 19   Temp: 97.6 F (36.4 C) 98 F (36.7 C) 97.8 F (36.6 C)    TempSrc: Oral Oral Oral  SpO2: 97% 98% 98%    Intake/Output Summary (Last 24 hours) at 12/26/15 1402 Last data filed at 12/25/15 2200  Gross per 24 hour  Intake              120 ml  Output                0 ml  Net              120 ml   There were no vitals filed for this visit.  Examination: Physical Exam:  Constitutional: Chronic Ill appearing 76 year old male who appears uncompfortable and in pain Eyes: Lids and conjunctivae normal, sclerae anicteric  ENMT: External Ears, Nose appear normal. Grossly normal hearing.  Neck: Appears normal, supple, no cervical masses, normal ROM, no appreciable thyromegaly, no JVD Respiratory: Diminished bilaterally, no wheezing, rales, rhonchi or crackles. Normal respiratory effort and patient is not tachypenic. No accessory muscle use.  Cardiovascular: RRR, no murmurs / rubs / gallops. S1 and S2 auscultated. No extremity edema. Abdomen: Soft, non-tender, non-distended. No masses palpated. No appreciable hepatosplenomegaly. Bowel sounds positive.  GU: Deferred. Musculoskeletal: No clubbing / cyanosis of digits/nails. No joint deformity upper and  lower extremities. No contractures. Decreased strength and muscle tone in LE with 0/5 in Strength.   Skin: No rashes, lesions, ulcers. No induration; Warm and dry.  Neurologic: CN 2-12 grossly intact with no focal deficits. Sensation decreased especially from Right Leg to Right Nipple line and Left Leg up until Thigh, Romberg sign cerebellar reflexes not assessed.  Psychiatric: Normal judgment and insight. Alert and oriented x 3. Depressed mood and flat affect.  Data Reviewed: I have personally reviewed following labs and imaging studies  CBC:  Recent Labs Lab 12/21/15 0543 12/25/15 0511 12/25/15 0700 12/25/15 2230 12/26/15 0400  WBC 7.9 8.4 6.8  --  9.3  NEUTROABS  --  8.1*  --   --   --   HGB 11.5* 7.7* 7.4* 9.1* 8.7*  HCT 35.3* 23.8* 22.7* 26.8* 25.1*  MCV 92.4 93.7 93.4  --  89.3  PLT 400 302  278  --  99991111   Basic Metabolic Panel:  Recent Labs Lab 12/21/15 0543 12/23/15 0738 12/25/15 0511 12/26/15 0400  NA 135  --  140 138  K 4.7  --  4.8 4.5  CL 102  --  108 110  CO2 27  --  25 24  GLUCOSE 138*  --  132* 139*  BUN 31*  --  55* 63*  CREATININE 0.70 0.64 0.75 0.84  CALCIUM 7.9*  --  7.2* 7.2*   GFR: Estimated Creatinine Clearance: 83.8 mL/min (by C-G formula based on SCr of 0.84 mg/dL). Liver Function Tests:  Recent Labs Lab 12/25/15 0511 12/26/15 0400  AST 29 32  ALT 20 22  ALKPHOS 67 60  BILITOT 0.6 1.0  PROT 4.3* 3.8*  ALBUMIN 1.7* 1.8*   No results for input(s): LIPASE, AMYLASE in the last 168 hours. No results for input(s): AMMONIA in the last 168 hours. Coagulation Profile: No results for input(s): INR, PROTIME in the last 168 hours. Cardiac Enzymes: No results for input(s): CKTOTAL, CKMB, CKMBINDEX, TROPONINI in the last 168 hours. BNP (last 3 results) No results for input(s): PROBNP in the last 8760 hours. HbA1C: No results for input(s): HGBA1C in the last 72 hours. CBG:  Recent Labs Lab 12/25/15 1159 12/25/15 1906 12/25/15 2345 12/26/15 0750 12/26/15 1135  GLUCAP 135* 137* 132* 131* 166*   Lipid Profile: No results for input(s): CHOL, HDL, LDLCALC, TRIG, CHOLHDL, LDLDIRECT in the last 72 hours. Thyroid Function Tests: No results for input(s): TSH, T4TOTAL, FREET4, T3FREE, THYROIDAB in the last 72 hours. Anemia Panel: No results for input(s): VITAMINB12, FOLATE, FERRITIN, TIBC, IRON, RETICCTPCT in the last 72 hours. Sepsis Labs: No results for input(s): PROCALCITON, LATICACIDVEN in the last 168 hours.  Recent Results (from the past 240 hour(s))  Culture, blood (Routine X 2) w Reflex to ID Panel     Status: None   Collection Time: 12/17/15  7:29 PM  Result Value Ref Range Status   Specimen Description BLOOD LEFT HAND  Final   Special Requests BOTTLES DRAWN AEROBIC ONLY 5CC  Final   Culture NO GROWTH 5 DAYS  Final   Report Status  12/22/2015 FINAL  Final  Culture, blood (Routine X 2) w Reflex to ID Panel     Status: None   Collection Time: 12/17/15  7:45 PM  Result Value Ref Range Status   Specimen Description BLOOD LEFT ANTECUBITAL  Final   Special Requests BOTTLES DRAWN AEROBIC ONLY 5CC  Final   Culture NO GROWTH 5 DAYS  Final   Report Status 12/22/2015 FINAL  Final  Culture, expectorated sputum-assessment     Status: None   Collection Time: 12/18/15  9:44 AM  Result Value Ref Range Status   Specimen Description EXPECTORATED SPUTUM  Final   Special Requests NONE  Final   Sputum evaluation   Final    THIS SPECIMEN IS ACCEPTABLE. RESPIRATORY CULTURE REPORT TO FOLLOW.   Report Status 12/18/2015 FINAL  Final  Culture, respiratory (NON-Expectorated)     Status: None   Collection Time: 12/18/15  9:44 AM  Result Value Ref Range Status   Specimen Description EXPECTORATED SPUTUM  Final   Special Requests NONE  Final   Gram Stain   Final    ABUNDANT WBC PRESENT, PREDOMINANTLY PMN FEW BUDDING YEAST SEEN RARE GRAM POSITIVE COCCI IN PAIRS RARE GRAM NEGATIVE COCCOBACILLI    Culture   Final    RARE CANDIDA ALBICANS RARE METHICILLIN RESISTANT STAPHYLOCOCCUS AUREUS    Report Status 12/21/2015 FINAL  Final   Organism ID, Bacteria METHICILLIN RESISTANT STAPHYLOCOCCUS AUREUS  Final      Susceptibility   Methicillin resistant staphylococcus aureus - MIC*    CIPROFLOXACIN <=0.5 SENSITIVE Sensitive     ERYTHROMYCIN <=0.25 SENSITIVE Sensitive     GENTAMICIN <=0.5 SENSITIVE Sensitive     OXACILLIN <=0.25 RESISTANT Resistant     TETRACYCLINE <=1 SENSITIVE Sensitive     VANCOMYCIN 1 SENSITIVE Sensitive     TRIMETH/SULFA <=10 SENSITIVE Sensitive     CLINDAMYCIN <=0.25 SENSITIVE Sensitive     RIFAMPIN <=0.5 SENSITIVE Sensitive     Inducible Clindamycin NEGATIVE Sensitive     * RARE METHICILLIN RESISTANT STAPHYLOCOCCUS AUREUS  Fungus Culture With Stain     Status: None (Preliminary result)   Collection Time: 12/18/15 11:48  AM  Result Value Ref Range Status   Fungus Stain Final report  Final    Comment: (NOTE) Performed At: [facility] [place], [place] [id number] Lindon Romp MD [id number]    Fungus (Mycology) Culture PENDING  Incomplete   Fungal Source ASPIRATE  Final    Comment:  RLL POSTERIOR MEDIASTINAL MASS AT T7   Acid Fast Smear (AFB)     Status: None   Collection Time: 12/18/15 11:48 AM  Result Value Ref Range Status   AFB Specimen Processing Concentration  Final   Acid Fast Smear Negative  Final    Comment: (NOTE) Performed At: [facility] [place], [place] [id number] Lindon Romp MD [id number]    Source (AFB) ASPIRATE  Final    Comment:  RLL POSTERIOR MEDIASTINAL MASS AT T7   Aerobic/Anaerobic Culture (surgical/deep wound)     Status: None   Collection Time: 12/18/15 11:48 AM  Result Value Ref Range Status   Specimen Description ASPIRATE  Final   Special Requests RLL POSTERIOR MEDIASTINAL MASS AT T7  Final   Gram Stain   Final    RARE WBC PRESENT, PREDOMINANTLY PMN NO ORGANISMS SEEN    Culture FEW STAPHYLOCOCCUS AUREUS NO ANAEROBES ISOLATED   Final   Report Status 12/23/2015 FINAL  Final   Organism ID, Bacteria STAPHYLOCOCCUS AUREUS  Final      Susceptibility   Staphylococcus aureus - MIC*    CIPROFLOXACIN <=0.5 SENSITIVE Sensitive     ERYTHROMYCIN <=0.25 SENSITIVE Sensitive     GENTAMICIN <=0.5 SENSITIVE Sensitive     OXACILLIN <=0.25 SENSITIVE Sensitive     TETRACYCLINE <=1 SENSITIVE Sensitive     VANCOMYCIN 1 SENSITIVE Sensitive     TRIMETH/SULFA <=10 SENSITIVE Sensitive  CLINDAMYCIN <=0.25 SENSITIVE Sensitive     RIFAMPIN <=0.5 SENSITIVE Sensitive     Inducible Clindamycin NEGATIVE Sensitive     * FEW STAPHYLOCOCCUS AUREUS  Fungus Culture Result     Status: None   Collection Time: 12/18/15 11:48 AM  Result Value Ref Range Status   Result 1 Comment  Final    Comment: (NOTE) KOH/Calcofluor preparation:  no  fungus observed. Performed At: [facility] [address] [place], [place] [id number] Lindon Romp MD [id number]     Radiology Studies: No results found.  Scheduled Meds: . calcitonin (salmon)  1 spray Alternating Nares Daily  . dexamethasone  10 mg Intravenous Q12H  . diltiazem  180 mg Oral Daily  . feeding supplement (ENSURE ENLIVE)  237 mL Oral BID BM  . insulin aspart  0-5 Units Subcutaneous QHS  . insulin aspart  0-9 Units Subcutaneous TID WC  . LORazepam  0.5 mg Oral Daily  . nicotine polacrilex  2 mg Oral Q2H  . [START ON 12/29/2015] pantoprazole  40 mg Intravenous Q12H  . sodium chloride flush  3 mL Intravenous Q12H  . timolol  1 drop Both Eyes BID   Continuous Infusions: . sodium chloride    . sodium chloride    . pantoprozole (PROTONIX) infusion 8 mg/hr (12/26/15 0625)    LOS: 1 day   Kerney Elbe, DO [facility] Pager [phone]  If 7PM-7AM, please contact night-coverage [email or web address] Password [email or web address] 12/26/2015, 2:02 PM

## 2015-12-26 NOTE — Progress Notes (Signed)
Rehab Admissions Coordinator Note:  Patient was screened by Retta Diones for appropriateness for an Inpatient Acute Rehab Consult. Patient well known to me from brief inpatient rehab stay 10/24 to 12/25/15.  I am hoping to readmit to inpatient rehab once patient becomes medically stable.  I will need insurance authorization which might take a couple days.  Call me for questions.  Retta Diones 12/26/2015, 11:53 AM  I can be reached at [phone].

## 2015-12-26 NOTE — Progress Notes (Signed)
Daily Rounding Note  12/26/2015, 9:59 AM  LOS: 1 day   SUBJECTIVE:   Chief complaint:   Nausea.  This started this AM.  Stools are black, loose.  Some abdominal discomfort    OBJECTIVE:         Vital signs in last 24 hours:    Temp:  [97.5 F (36.4 C)-98 F (36.7 C)] 97.8 F (36.6 C) (10/26 0848) Pulse Rate:  [58-70] 62 (10/26 0848) Resp:  [18-20] 19 (10/26 0848) BP: (119-174)/(72-95) 119/95 (10/26 0848) SpO2:  [97 %-100 %] 98 % (10/26 0848) Last BM Date: 12/25/15 There were no vitals filed for this visit. General: looks ill, comfortable.  alert   Heart: RRR.  NSR on monitor.   Chest: clear bil.  No cough.  Some dyspnea with speach Abdomen: soft, NT, BS hypoactive.    Extremities: paralysis in LE Neuro/Psych:  Appropriate.  Not confused.  Speech is slow, halting.    Intake/Output from previous day: 10/25 0701 - 10/26 0700 In: 120 [P.O.:120] Out: -   Intake/Output this shift: No intake/output data recorded.  Lab Results:  Recent Labs  12/25/15 0511 12/25/15 0700 12/25/15 2230 12/26/15 0400  WBC 8.4 6.8  --  9.3  HGB 7.7* 7.4* 9.1* 8.7*  HCT 23.8* 22.7* 26.8* 25.1*  PLT 302 278  --  206   BMET  Recent Labs  12/25/15 0511 12/26/15 0400  NA 140 138  K 4.8 4.5  CL 108 110  CO2 25 24  GLUCOSE 132* 139*  BUN 55* 63*  CREATININE 0.75 0.84  CALCIUM 7.2* 7.2*   LFT  Recent Labs  12/25/15 0511 12/26/15 0400  PROT 4.3* 3.8*  ALBUMIN 1.7* 1.8*  AST 29 32  ALT 20 22  ALKPHOS 67 60  BILITOT 0.6 1.0   PT/INR No results for input(s): LABPROT, INR in the last 72 hours. Hepatitis Panel No results for input(s): HEPBSAG, HCVAB, HEPAIGM, HEPBIGM in the last 72 hours.  Studies/Results: No results found.   Scheduled Meds: . calcitonin (salmon)  1 spray Alternating Nares Daily  . dexamethasone  10 mg Intravenous Q12H  . diltiazem  180 mg Oral Daily  . feeding supplement (ENSURE ENLIVE)  237  mL Oral BID BM  . insulin aspart  0-5 Units Subcutaneous QHS  . insulin aspart  0-9 Units Subcutaneous TID WC  . LORazepam  0.5 mg Oral Daily  . nicotine polacrilex  2 mg Oral Q2H  . [START ON 12/29/2015] pantoprazole  40 mg Intravenous Q12H  . sodium chloride flush  3 mL Intravenous Q12H  . timolol  1 drop Both Eyes BID   Continuous Infusions: . sodium chloride    . pantoprozole (PROTONIX) infusion 8 mg/hr (12/26/15 0625)   PRN Meds:.acetaminophen **OR** acetaminophen, fentaNYL (SUBLIMAZE) injection, LORazepam, methocarbamol (ROBAXIN)  IV, polyethylene glycol, promethazine **OR** promethazine **OR** promethazine, senna-docusate, traZODone   ASSESMENT:   *  Upper GI bleed in patient who is receiving both Eliquis, for new onset A. fib, and Plavix for history of TIA, cerebrovascular disease. Day 2 IV PPI drip.    *  ABL anemia.  Hgb improved post PRBC x 2.      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  Cause of fracture felt to be infectious , malignancy not entirely ruled out..  *  DVT left calf. Discovered on 10/25.  *  Right lung mass.  Has not been biopsied.   *  A fib, RVR.  Eliquis on hold.  Plavix also on hold.    *  Carotid artery atherosclerosis   *  Neurogenic bowel and bladder.    PLAN   *  EGD tentatively set for 10/27.  Continue clears.  Antinauseals.  PPI drip.      Azucena Freed  12/26/2015, 9:59 AM Pager: [phone]  I have discussed the case with the PA, and that is the plan I formulated. I personally interviewed and examined the patient.  He still has reported black stool, but hemoglobin stable after initial transfusion. He is on a PPI drip I will allow him to eat today, then nothing by mouth after midnight for planned upper endoscopy tomorrow. Check hemoglobin again tonight and in the morning and transfuse as needed.    Nelida Meuse III Pager [phone]  Mon-Fri 8a-5p [phone] after 5p, weekends,  holidays

## 2015-12-26 NOTE — Progress Notes (Signed)
Initial Nutrition Assessment  DOCUMENTATION CODES:   Obesity unspecified  INTERVENTION:  Provide Boost Breeze po TID, each supplement provides 250 kcal and 9 grams of protein.  Ensure Enlive po BID PRN, each supplement provides 350 kcal and 20 grams of protein.  Encourage adequate PO intake.   NUTRITION DIAGNOSIS:   Inadequate oral intake related to poor appetite (abdominal pain) as evidenced by per patient/family report.  GOAL:   Patient will meet greater than or equal to 90% of their needs  MONITOR:   PO intake, Supplement acceptance, Diet advancement, Labs, Weight trends, Skin, I & O's  REASON FOR ASSESSMENT:   Consult Assessment of nutrition requirement/status  ASSESSMENT:   75 y.o. male.  PMH DM. GERD. TIA.   Carotid stenosis, status post CEA. Anxiety. Pt with closed T7 fracture possibly lytic versus infectious. Pt  transferred [facility] from [facility]. Patient's current complaints include intermittent pulling or burning sensation of his epigastric and right upper and left upper quadrants. Staff on CIR note dark to black stools for the last 24 hours.   Pt is currently on a clear liquid diet. No meal completion recorded from this AM, however pt reports only consuming an Ensure shake. Pt does report a decreased appetite from abdominal pains which has been ongoing over the past ~10 days. Pt reports Ensure might be increasing his abdominal pains. Pt agreeable to [employer]. RD to modify orders. Pt reports eating well PTA with usual consumption of at least 2 meals a day. Usual body weight reported to be ~203 lbs. Question accuracy of most recent recorded weight.   Nutrition-Focused physical exam completed. Findings are no fat depletion, mild to moderate muscle depletion, and mild edema.   Labs and medications reviewed.   Diet Order:  Diet clear liquid Room service appropriate? Yes; Fluid consistency: Thin Diet NPO time specified  Skin:  Wound (see  comment) (Stage I to buttocks, stg 2 to sacrum)  Last BM:  10/25  Height:   Ht Readings from Last 1 Encounters:  12/24/15 5\' 7"  (1.702 m)    Weight:   Wt Readings from Last 1 Encounters:  12/25/15 191 lb 12.8 oz (87 kg)    Ideal Body Weight:  67.27 kg  BMI:  There is no height or weight on file to calculate BMI.  Estimated Nutritional Needs:   Kcal:  1900-2100  Protein:  85-105 grams  Fluid:  1.9 - 2.1 L/day  EDUCATION NEEDS:   No education needs identified at this time  Corrin Parker, MS, RD, LDN Pager # [phone] After hours/ weekend pager # [phone]

## 2015-12-26 NOTE — IPOC Note (Signed)
Overall Plan of Care [facility]) Patient Details Name: Marcus [name] MRN: [id number] DOB: 05/25/1940  Admitting Diagnosis: SCI W  Paraplegia  Hospital Problems: Active Problems:   PAF (paroxysmal atrial fibrillation) (HCC)   Paraplegia (HCC)   Left leg DVT (HCC)   Acute blood loss anemia   Melena     Functional Problem List: Nursing Bladder, Bowel, Medication Management, Pain, Skin Integrity  PT Balance, Endurance, Motor, Pain, Sensory, Skin Integrity  OT    SLP    TR         Basic ADL's: OT       Advanced  ADL's: OT       Transfers: PT Bed Mobility, Bed to Chair, Car, Furniture  OT       Locomotion: PT Wheelchair Mobility     Additional Impairments: OT    SLP        TR      Anticipated Outcomes Item Anticipated Outcome  Self Feeding    Swallowing      Basic self-care     Toileting      Bathroom Transfers    Bowel/Bladder  max assist  Transfers  Supervision  Locomotion  Mod I w/c level  Communication     Cognition     Pain  less than 3 out of 10  Safety/Judgment  min assist   Therapy Plan: PT Intensity: Minimum of 1-2 x/day ,45 to 90 minutes PT Frequency: 5 out of 7 days PT Duration Estimated Length of Stay: 3-4 weeks           Team Interventions: Nursing Interventions Patient/Family Education, Bladder Management, Bowel Management, Pain Management, Medication Management, Skin Care/Wound Management, Discharge Planning  PT interventions Balance/vestibular training, Cognitive remediation/compensation, Community reintegration, Discharge planning, Disease management/prevention, DME/adaptive equipment instruction, Functional mobility training, Neuromuscular re-education, Pain management, Patient/family education, Psychosocial support, Skin care/wound management, Splinting/orthotics, Therapeutic Activities, Therapeutic Exercise, UE/LE Strength taining/ROM, UE/LE Coordination activities, Wheelchair propulsion/positioning  OT Interventions    SLP  Interventions    TR Interventions    SW/CM Interventions Discharge Planning, Psychosocial Support, Patient/Family Education    Team Discharge Planning: Destination: PT-Home ,OT-   , SLP-  Projected Follow-up: PT-Home health PT, 24 hour supervision/assistance, OT-   , SLP-  Projected Equipment Needs: PT-Sliding board, Wheelchair (measurements), Wheelchair cushion (measurements), Other (comment), OT-  , SLP-  Equipment Details: PT-hospital bed, OT-  Patient/family involved in discharge planning: PT- Patient,  OT- , SLP-   MD ELOS: 16-20 days Medical Rehab Prognosis:  Fair Assessment: 75 y.o.right handed malewith history of urethral stricture, TIA on Plavix, carotid stenosis status post CEA, hypertension, diabetes mellitus, tremor.Per chart review, wife, and daughter patient lives with spouse. One level home. 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 He presented 12/14/2015 with generalized weakness failure to thrive after recent fall.Patient found to be in atrial fibrillation with RVR. Initial chest x-ray evaluation revealed community-acquired pneumonia as well as findings of right lower lobe mass, closed fracture of T7vertebrae. CT of the chest showed rounded masslike consolidation in the medial aspect of the superior segment right lower lobe measuring 4.7 x 4.9 cm new from 07/07/2013. 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 Follow-up neurosurgery Dr. Annette Stable with review of MRI thoracic spine no plan for surgical  intervention at this time. CT-guided biopsy performed 12/18/2015 with results suggesting infection. Radiation oncology consulted in regards to findings of mass plan to begin simulation and  radiation treatment.Infectious disease consulted for [name] with osteomyelitis and advised intravenous Vancomycin through 01/05/2016 then began cefazolin 2 g intravenous every 8 hours after vancomycin completed for a total treatment course of 6 weeks to be completed 01/29/2016 Dropped his hemoglobin during inpatient rehabilitation and developed DVTs, transfer to acute care on 12/25/2015     See Team Conference Notes for weekly updates to the plan of care

## 2015-12-26 NOTE — Progress Notes (Signed)
[facility]  New pt for [facility] this admission.  [facility] is following as inpatient for DC home on IV ABX.  [facility] will provide home infusion pharmacy services and partner with patients [facility] agency of choice upon DC to home.   If patient discharges after hours, please call [phone].   Raun Sierras 12/26/2015, 8:39 AM

## 2015-12-26 NOTE — Evaluation (Signed)
Occupational Therapy Evaluation Patient Details Name: Marcus [name] MRN: [id number] DOB: Nov 09, 1940 Today's Date: 12/26/2015    History of Present Illness 75 y.o. male being transferred back to acute care services from CIR. Patient's current complaints include intermittent pulling or burning sensation of his epigastric and right upper and left upper quadrants. Sometimes describes the pain as a rope pulling him tight sensation. Worse with certain movements and deep breathing.  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   Clinical Impression   Pt with decline in function and safety with ADLs and ADL mobility with decreased strength, balance and endurance. Pt very fatigued, lethargic and declined any EOB./OOB activity. Pt also with decreased HgB around 7.0. Pt previously on CIR and planning to return once medically stable    Follow Up Recommendations  CIR    Equipment Recommendations  Other (comment) (TBD at [facility])    Recommendations for Other Services Rehab consult     Precautions / Restrictions Precautions Precautions: Fall Precaution Comments: Paraplegia, multiple sacral and buttocks wounds; limit time OOB to 2 hours at a time Restrictions Weight Bearing Restrictions: No      Mobility Bed Mobility Overal bed mobility: Needs Assistance;+2 for physical assistance Bed Mobility: Rolling Rolling: Max assist         General bed mobility comments: Unable to progress to EOB due to pain and weakness.  Transfers                 General transfer comment: unable    Balance  Poor                                          ADL Overall ADL's : Needs assistance/impaired     Grooming: Moderate assistance   Upper Body Bathing: Moderate assistance   Lower Body Bathing: Total assistance   Upper Body Dressing : Moderate assistance   Lower Body  Dressing: Total assistance       Toileting- Clothing Manipulation and Hygiene: Total assistance         General ADL Comments: pt very weak and fatigued, HGb around 7.0, Pt declined EOB acitivity     Vision Vision Assessment?: No apparent visual deficits              Pertinent Vitals/Pain Pain Assessment: 0-10 Pain Score: 7  Pain Location: abdomen Pain Descriptors / Indicators: Sore;Cramping Pain Intervention(s): Limited activity within patient's tolerance;Monitored during session     Hand Dominance Right   Extremity/Trunk Assessment Upper Extremity Assessment Upper Extremity Assessment: Defer to OT evaluation RUE Deficits / Details: 3+/5 grossly, B grip and sensation intact LUE Deficits / Details: 3+/5 grossly, B grip and sensation intact   Lower Extremity Assessment Lower Extremity Assessment: RLE deficits/detail;LLE deficits/detail RLE Deficits / Details: no sensation below T7, some reflexive-type movement in foot/toes; 0/5 strength with atrophy noted ant tib RLE Sensation: decreased light touch;decreased proprioception LLE Deficits / Details: no sensation below T7, some reflexive-type movement in foot/toes; 0/5 strength with atrophy noted ant tib LLE Sensation: decreased light touch;decreased proprioception       Communication Communication Communication: No difficulties   Cognition Arousal/Alertness: Awake/alert Behavior During Therapy: Anxious;Restless Overall Cognitive Status: Impaired/Different from baseline Area of Impairment: Problem solving;Memory     Memory: Decreased short-term memory  Problem Solving: Difficulty sequencing;Requires verbal cues;Requires tactile cues;Slow processing     General Comments   Pt pleasant, family supportive                 Home Living Family/patient expects to be discharged to:: Private residence Living Arrangements: Spouse/significant other Available Help at Discharge: Family;Available 24  hours/day Type of Home: House Home Access: Level entry     Home Layout: One level     Bathroom Shower/Tub: Tub/shower unit;Curtain   Bathroom Toilet: Handicapped height     Home Equipment: Environmental consultant - 2 wheels      Lives With: Spouse    Prior Functioning/Environment Level of Independence: Independent        Comments: 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        OT Problem List: Decreased strength;Decreased knowledge of use of DME or AE;Decreased range of motion;Decreased coordination;Decreased activity tolerance;Decreased cognition;Impaired balance (sitting and/or standing);Decreased safety awareness   OT Treatment/Interventions: Self-care/ADL training;Therapeutic exercise;Neuromuscular education;Energy conservation;DME and/or AE instruction;Therapeutic activities;Balance training;Patient/family education    OT Goals(Current goals can be found in the care plan section) Acute Rehab OT Goals Patient Stated Goal: to go home OT Goal Formulation: With patient/family Time For Goal Achievement: 01/09/16 Potential to Achieve Goals: Fair ADL Goals Pt Will Perform Grooming: with min assist;sitting;with caregiver independent in assisting (sitting EOB) Pt Will Perform Upper Body Bathing: with min assist;with caregiver independent in assisting;sitting (sitting EOB) Pt Will Perform Lower Body Bathing: with max assist;with mod assist;with caregiver independent in assisting;sitting/lateral leans Pt Will Perform Upper Body Dressing: with min assist;sitting;with caregiver independent in assisting (sitting EOB) Additional ADL Goal #1: Pt will perform bed mobility/rolling with mod A for pericare/hygiene Additional ADL Goal #2: Pt will sit EOB x 5 -10 minutes for grooming and ADL tasks  OT Frequency: Min 2X/week   Barriers to D/C: Decreased caregiver support                        End of Session    Activity Tolerance: Patient limited by  fatigue;Patient limited by lethargy Patient left: in bed;with call bell/phone within reach;with family/visitor present   Time:  -    Charges:  OT General Charges $OT Visit: 1 Procedure OT Evaluation $OT Eval Moderate Complexity: 1 Procedure G-Codes:    [name] 12/26/2015, 2:13 PM

## 2015-12-26 NOTE — Discharge Summary (Signed)
Marcus [name], Marcus [name] NO.:  [id number]  MEDICAL RECORD NO.:  [id number]  LOCATION:  [id number]                        FACILITY:  [facility]  PHYSICIAN:  Meredith Staggers, M.D.DATE OF BIRTH:  Oct 13, 1940  DATE OF ADMISSION:  12/24/2015 DATE OF DISCHARGE:  12/25/2015                              DISCHARGE SUMMARY   DISCHARGE DIAGNOSES: 1. 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 2. Pain management. 3. Left peroneal vein DVT and right soleal vein DVT. 4. Suspect GI bleed. 5. MRSA, MSSA. 6. Neurogenic bowel and bladder. 7. Atrial fibrillation with rapid ventricular response. 8. Diabetes mellitus.  HISTORY OF PRESENT ILLNESS:  This is a 75 year old right-handed male with history of TIA, on Plavix, carotid stenosis with CEA, hypertension lives with his wife.  Presented December 14, 2015, with generalized weakness, failure to thrive after recent fall.  Found to be in atrial fibrillation with RVR.  Initial chest x-ray evaluation revealed community-acquired pneumonia as well as findings of right lower lobe mass.  Closed fracture of T7 vertebrae.  CT of the chest showed rounded mass-like consolidation in the medial aspect of superior segment, right lower lobe.  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  Cranial CT scan negative.  Neurosurgery consulted.  Review of MRI of thoracic spine, no surgical intervention.  CT guided biopsy performed December 18, 2015, with results suggesting infection.  Radiation Oncology has been consulted.  Infectious Disease followup for [name], osteomyelitis, maintained on vancomycin as well as Cefazolin. Echocardiogram without vegetation.  TEE  showed no endocarditis. Cardiology followup for atrial fibrillation, maintained on Cardizem with addition of Eliquis in addition to Plavix.  Physical and occupational therapy ongoing.  The patient was admitted for comprehensive rehab program.  PAST MEDICAL HISTORY:  See discharge diagnoses.  SOCIAL HISTORY:  He lives with spouse.  Functional status upon admission to rehab services was max assist, sit to side lying; max assist, rolling; total assist, ADLs.  PHYSICAL EXAMINATION:  VITAL SIGNS:  Blood pressure 136/75, pulse 56, temperature 97, respirations 17. GENERAL:  This was an alert male, he had some delay in processing, followed simple commands. LUNGS:  Decreased breath sounds.  Clear to auscultation. CARDIAC:  Regular rate and rhythm without murmur. ABDOMEN:  Soft, nontender.  Good bowel sounds. NEURO:  T6 sensory level.  REHABILITATION/HOSPITAL COURSE:  The patient was admitted to inpatient rehab services with therapies initiated on a 3-hour daily basis, consisting of physical therapy, occupational therapy, and rehabilitation nursing.  The following issues were addressed during the patient's rehabilitation stay.  Pertaining to Marcus [name]'s T6 spinal cord injury, paraplegia secondary to suspect infection, compression myelopathy, followup per Neurosurgery, no current surgical intervention.  The patient had been recently placed on Eliquis for atrial fibrillation, cardiac rate controlled.  This was discontinued December 25, 2015, due to decrease in hemoglobin to 7.4, also on Plavix was discontinued.  He remained on cefazolin and vancomycin for MSSA, MRSA,  followup per Infectious Disease.  Venous Doppler studies of lower extremities on October, 25, 2017, showed right soleal vein and left peroneal vein DVT. His Eliquis had been discontinued due to suspect GI bleed with Gastroenterology consulted.  Plan for transfusion due to anemia.  Noted elevated BUN of 55.  In light of these  medical findings, the patient was discharged back to [facility] for ongoing medical management. All medication changes made as per [facility]   ______________________________ Meredith Staggers, M.D.    DA/MEDQ  D:  12/25/2015  T:  12/26/2015  Job:  [id number]

## 2015-12-26 NOTE — Telephone Encounter (Signed)
I spoke with the patient's wife to confirm that we will sign off on his case but be available as needed moving forward.

## 2015-12-26 NOTE — Progress Notes (Signed)
Retta Diones, RN Rehab Admission Coordinator Signed Physical Medicine and Rehabilitation  PMR Pre-admission Date of Service: 12/24/2015 1:43 PM  Related encounter: ED to Hosp-Admission (Discharged) from 12/14/2015 in [facility]       _0 Hide copied text PMR Admission Coordinator Pre-Admission Assessment  Patient: Marcus [name] is an 75 y.o., male MRN: [id number] DOB: 1940/05/11 Height: 5' 7.5" (171.5 cm) Weight: 93.1 kg (205 lb 4.8 oz)                                                                                                                                                  Insurance Information HMO: Yes   PPO:       PCP:       IPA:       80/20:       OTHER:   PRIMARY: BCBS Medicare      Policy#: [id number]      Subscriber: [place] Name: Limmie Patricia      Phone#: [phone]     Fax#: [phone] Pre-Cert#:                               Employer:  [employer] Benefits:  Phone #: [phone]     Name: Milus Glazier. Date:  03/03/07     Deduct: $0      Out of Pocket Max: $4900 (met $454.83)      Life Max: unlimited CIR: $275 days 1-6      SNF:  $0 days 1-20; $164.50 days 21-100 Outpatient: medical necessity     Co-Pay: $40 Home Health: 100%      Co-Pay: none DME: 80%     Co-Pay: 20% Providers: in network  Medicaid Application Date:        Case Manager:   Disability Application Date:        Case Worker:    Emergency Contact Information        Contact Information    Name Relation Home Work [place] Spouse [phone]    Nudo,Colby Daughter [phone]       Current Medical History  Patient Admitting Diagnosis: T6 SCI with paraplegia, 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 One level home. 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 He presented 12/14/2015 with generalized weakness failure to thrive after recent fall.Patient found to be in atrial fibrillation with RVR. Initial chest x-ray evaluation revealed community-acquired pneumonia as well as findings of right lower lobe mass, closed fracture of  T7vertebrae. CT of the chest showed rounded masslike consolidation in the medial aspect of the superior segment right lower lobe measuring 4.7 x 4.9 cm new from 07/07/2013. 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 Follow-up neurosurgery Dr. Annette Stable with review of MRI thoracic spine no plan for surgical intervention at this time. CT-guided biopsy performed 12/18/2015 with results suggesting infection. Radiation oncology consulted in regards to findings of mass plan to begin simulation and radiation treatment.  Infectious disease consulted for [name] with osteomyelitis and advised intravenous Vancomycin through 01/05/2016 then began cefazolin 2 g intravenous every 8 hours after vancomycin completed for a total treatment course of 6 weeks to be completed 01/29/2016. However sputum cultures later showed MRSA with cefazolin discontinued andinitiation of vancomycin . Echocardiogram without any vegetation. TEE on 12/21/2015 showed no endocarditis.. Cardiology continues to follow for atrial fibrillation with RVR status post conversion normal sinus rhythm after being maintained on Cardizem driptransitioned to by mouth and the addition of Eliquisfor anticoagulation in addition to Plavix. Physical and  occupational therapy evaluations completed. Patient to be admitted for comprehensive inpatient rehabilitation program.   Past Medical History      Past Medical History:  Diagnosis Date  . Anxiety state, unspecified   . Atrial fibrillation with RVR ([place]) 12/14/2015  . Calculus of kidney   . 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 Complication of anesthesia    " I woke up in the recovery room with tube in throat and panicked."  . 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 Herpes zoster without mention of complication   . History of hiatal hernia   . Hypertrophy of prostate with urinary obstruction and other lower urinary tract symptoms (LUTS)   . Insomnia, unspecified   . Lumbago   . MSSA (methicillin susceptible Staphylococcus aureus) septicemia ([place]) 12/23/2015  . Other and unspecified hyperlipidemia   . Paralysis of both lower limbs ([place]) 12/24/2015  . Pneumonia    bronchial  . Reflux esophagitis   . Seasonal allergies   . Stroke ([place])   . TIA (transient ischemic attack)   . Type II or unspecified type diabetes mellitus without mention of complication, not stated as uncontrolled   . Unspecified essential hypertension   . Unspecified vitamin D deficiency   . Urethral stricture unspecified   . Urinary frequency     Family History  family history includes Alcohol abuse in his brother; Cancer in his mother; Diabetes in his mother; Hypertension in his father; Stroke in his father.  Prior Rehab/Hospitalizations: No previous rehab admissions.  Has the patient had major surgery during 100 days prior to admission? No  Current Medications   Current Facility-Administered Medications:  .  acetaminophen (TYLENOL) tablet 1,000 mg, 1,000 mg, Oral, Q6H PRN, Bertram Savin Sheikh, DO .  apixaban (ELIQUIS) tablet 5 mg, 5 mg, Oral, BID, Delane Ginger, RPH, 5 mg at 12/24/15 1101 .  atorvastatin (LIPITOR) tablet 40 mg, 40 mg, Oral, q1800, Venetia Maxon  Rama, MD, 40 mg at 12/23/15 1853 .  cholecalciferol (VITAMIN D) tablet 5,000 Units, 5,000 Units, Oral, QHS, Venetia Maxon Rama, MD, 5,000 Units at 12/23/15 2318 .  clopidogrel (PLAVIX) tablet 75 mg, 75 mg, Oral, Daily, Venetia Maxon Rama, MD, 75 mg at 12/24/15 1101 .  dexamethasone (  DECADRON) injection 10 mg, 10 mg, Intravenous, Q6H, Omair Latif Sheikh, DO, 10 mg at 12/24/15 1249 .  diltiazem (CARDIZEM CD) 24 hr capsule 180 mg, 180 mg, Oral, Daily, Skeet Latch, MD, 180 mg at 12/24/15 1102 .  DULoxetine (CYMBALTA) DR capsule 30 mg, 30 mg, Oral, Q supper, Venetia Maxon Rama, MD, 30 mg at 12/23/15 1852 .  guaiFENesin-dextromethorphan (ROBITUSSIN DM) 100-10 MG/5ML syrup 5 mL, 5 mL, Oral, Q4H PRN, Bertram Savin Sheikh, DO, 5 mL at 12/18/15 0504 .  HYDROcodone-acetaminophen (NORCO) 10-325 MG per tablet 1 tablet, 1 tablet, Oral, Q4H PRN, Kerney Elbe, DO, 1 tablet at 12/24/15 0943 .  insulin aspart (novoLOG) injection 0-9 Units, 0-9 Units, Subcutaneous, Q6H, Venetia Maxon Rama, MD, 1 Units at 12/24/15 [id number] .  ipratropium-albuterol (DUONEB) 0.5-2.5 (3) MG/3ML nebulizer solution 3 mL, 3 mL, Nebulization, Q2H PRN, [employer], DO .  LORazepam (ATIVAN) tablet 0.5 mg, 0.5 mg, Oral, Daily, Jake Church Masters, RPH, 0.5 mg at 12/24/15 1101 .  LORazepam (ATIVAN) tablet 0.5 mg, 0.5 mg, Oral, BID PRN, Jake Church Masters, RPH, 0.5 mg at 12/23/15 2319 .  morphine 2 MG/ML injection 2 mg, 2 mg, Intravenous, Q4H PRN, Bertram Savin Sheikh, DO, 2 mg at 12/24/15 1251 .  ondansetron (ZOFRAN) injection 4 mg, 4 mg, Intravenous, Q6H PRN, Lezlie Octave Black, NP .  polyethylene glycol (MIRALAX / GLYCOLAX) packet 17 g, 17 g, Oral, Daily, Lavina Hamman, MD, 17 g at 12/22/15 4268 .  senna-docusate (Senokot-S) tablet 1 tablet, 1 tablet, Oral, BID, Lavina Hamman, MD, 1 tablet at 12/23/15 2321 .  sodium chloride flush (NS) 0.9 % injection 10-40 mL, 10-40 mL, Intracatheter, PRN, Venetia Maxon Rama, MD, 10 mL at 12/23/15 2320 .  timolol  (TIMOPTIC) 0.5 % ophthalmic solution 1 drop, 1 drop, Both Eyes, BID, [facility], DO, 1 drop at 12/24/15 1103 .  vancomycin (VANCOCIN) 1,250 mg in sodium chloride 0.9 % 250 mL IVPB, 1,250 mg, Intravenous, Q12H, Delane Ginger, RPH, 1,250 mg at 12/24/15 1056  Patients Current Diet: Diet Heart Room service appropriate? Yes; Fluid consistency: Thin Diet - low sodium heart healthy  Precautions / Restrictions Precautions Precautions: Fall Precaution Comments: T6-7 paraplegia/paresis Restrictions Weight Bearing Restrictions: No   Has the patient had 2 or more falls or a fall with injury in the past year?Yes.  Had 1 fall 12 days ago with resultant injury.  Prior Activity Level Community (5-7x/wk): Went out daily.  Was driving.  Home Assistive Devices / Equipment Home Assistive Devices/Equipment: Eyeglasses Home Equipment: Walker - 2 wheels  Prior Device Use: Indicate devices/aids used by the patient prior to current illness, exacerbation or injury? None  Prior Functional Level Prior Function Level of Independence: Independent Comments: works full time as a used Musician Care: Did the patient need help bathing, dressing, using the toilet or eating?  Independent  Indoor Mobility: Did the patient need assistance with walking from room to room (with or without device)? Independent  Stairs: Did the patient need assistance with internal or external stairs (with or without device)? Independent  Functional Cognition: Did the patient need help planning regular tasks such as shopping or remembering to take medications? Independent  Current Functional Level Cognition Overall Cognitive Status: Impaired/Different from baseline Current Attention Level: Sustained, Selective Orientation Level: Oriented X4 General Comments: Pt with variable orientation.  He will answer incorrectly at times,but then able to back track and correct himself.  He was somewhat argumentative  with his wife  and daughter.  The more he argued with them, the more disconnected his conversation became - RN notified     Extremity Assessment (includes Sensation/Coordination) Upper Extremity Assessment: RUE deficits/detail, LUE deficits/detail, Generalized weakness RUE Deficits / Details: Mostly 4/5 throughout with painful overhead movements making overhead 4-/5 due to pain.  Grip equal bil, sensation intact.  LUE Deficits / Details: Mostly 4/5 throughout with painful overhead movements making overhead 4-/5 due to pain.  Grip equal bil, sensation intact.   Lower Extremity Assessment: Defer to PT evaluation RLE Deficits / Details: no sensation below T7 not even to painful stimuli, some reflexive-type movements, but otherwise 0/5 ankle, knee and hips bil LLE Deficits / Details: no sensation below T7 not even to painful stimuli, some reflexive-type movements, but otherwise 0/5 ankle, knee and hips bil   ADLs Overall ADL's : Needs assistance/impaired Lower Body Dressing: Total assistance General ADL Comments: Pt sat EOB for about 4 minutes with +1 mod A to periods of min guard using BUE positioned on bed to provide support.     Mobility Overal bed mobility: Needs Assistance, +2 for physical assistance Bed Mobility: Rolling, Sidelying to Sit, Sit to Sidelying Rolling: Max assist, +2 for physical assistance Sidelying to sit: Max assist, +2 for physical assistance, HOB elevated Sit to sidelying: Max assist, +2 for physical assistance General bed mobility comments: Able to initiate reaching for rail with UEs with step by step cues; assist with BLEs, bottom and trunk to get to EOB. Anxious.   Transfers Overall transfer level: Needs assistance Equipment used:  (slide board) Transfers: Lateral/Scoot Transfers  Lateral/Scoot Transfers: With slide board, From elevated surface, +2 physical assistance, Max assist General transfer comment: Declined slide board transfer today due to pain.   Ambulation /  Gait / Stairs / Scientist, clinical (histocompatibility and immunogenetics) / Balance Dynamic Sitting Balance Sitting balance - Comments: Fearful of falling today during activation of trunk musculature and balance exercises. Practiced lateral reaching to right and left x5 with max A as well as anterior lean to activate lower abdomen.  Balance Overall balance assessment: Needs assistance Sitting-balance support: Feet supported, Bilateral upper extremity supported Sitting balance-Leahy Scale: Poor Sitting balance - Comments: Fearful of falling today during activation of trunk musculature and balance exercises. Practiced lateral reaching to right and left x5 with max A as well as anterior lean to activate lower abdomen.  Postural control: Posterior lean   Special needs/care consideration BiPAP/CPAP No CPM No Continuous Drip IV No Dialysis No        Life Vest No Oxygen No Special Bed Yes, has an alternate air bed Trach Size No Wound Vac (area) No      Skin Has injury to coccyx and buttocks, wound oozing, scab on right knee.  Has compression stockings in place.                             Bowel mgmt: Last BM 12/23/15 Bladder mgmt: Urinary catheter Diabetic mgmt No Contact Precautions: yes, MRSA   Previous Home Environment Living Arrangements: Spouse/significant other Available Help at Discharge: Family, Available 24 hours/day Type of Home: House Home Layout: One level Home Access: Level entry Bathroom Shower/Tub: Tub/shower unit, Architectural technologist: Handicapped height Mertens: No  Discharge Living Setting Plans for Discharge Living Setting: Patient's home, House, Lives with (comment) (Lives with wife.) Type of Home at Discharge: House (Townhouse) Discharge Home Layout: One level Discharge Home Access:  Level entry Does the patient have any problems obtaining your medications?: No  Social/Family/Support Systems Patient Roles: Spouse, Parent (Has a wife and daughter.) Contact Information:  Jake Bathe - wife Anticipated Caregiver: Wife Anticipated Caregiver's Contact Information: Neoma Laming - wife - [phone] Ability/Limitations of Caregiver: Wife works days.  Can check on patient periodically. Caregiver Availability: Intermittent Discharge Plan Discussed with Primary Caregiver: Yes Is Caregiver In Agreement with Plan?: Yes Does Caregiver/Family have Issues with Lodging/Transportation while Pt is in Rehab?: No  Goals/Additional Needs Patient/Family Goal for Rehab: PT/OT supervision and min assist goals Expected length of stay: 18-22 days Cultural Considerations: None Dietary Needs: Heart diet, thin liquids Equipment Needs: TBD Pt/Family Agrees to Admission and willing to participate: Yes Program Orientation Provided & Reviewed with Pt/Caregiver Including Roles  & Responsibilities: Yes  Decrease burden of Care through IP rehab admission: N/A  Possible need for SNF placement upon discharge: Not planned, but possible need for SNF if family cannot manage at functional level at the time of discharge  Patient Condition: This patient's medical and functional status has changed since the consult dated: 12/19/15 in which the Rehabilitation Physician determined and documented that the patient's condition is appropriate for intensive rehabilitative care in an inpatient rehabilitation facility. See "History of Present Illness" (above) for medical update. Functional changes are:  Currently requiring max assist for bed mobility and total assist for ADLs. Patient's medical and functional status update has been discussed with the Rehabilitation physician and patient remains appropriate for inpatient rehabilitation. Will admit to inpatient rehab today.   Preadmission Screen Completed By:  Retta Diones, 12/24/2015 2:02 PM ______________________________________________________________________   Discussed status with Dr. Posey Pronto on 12/24/15 at 83 and received telephone approval  for admission today.  Admission Coordinator:  Retta Diones, time10/24/17/Date1402       Cosigned by: Ankit [name], MD at 12/24/2015 2:14 PM

## 2015-12-26 NOTE — Progress Notes (Signed)
I stopped by this morning to see the patient. He was awake and we discussed the final pathology from his perispinal mass that was biopsied and was consistent with osteomyelitis. He continues on IV antibiotics, and is working diligently with PT. He unfortunately has not regained any meaningful use of his lower extremities. He is quite fatigued and weak. I let him know that I would also contact his wife, but wanted to make sure he  Was informed that at this point we do not feel that there is any role for radiotherapy due to the infectious findings. We would be happy to see him again if there became a role for [place]. We appreciate the chance to meet this patient and will be available as needed.    Carola Rhine, PAC

## 2015-12-26 NOTE — Evaluation (Signed)
Physical Therapy Evaluation Patient Details Name: Marcus [name] MRN: [id number] DOB: Sep 04, 1940 Today's Date: 12/26/2015   History of Present Illness  75 y.o. male being transferred back to acute care services from CIR. Patient's current complaints include intermittent pulling or burning sensation of his epigastric and right upper and left upper quadrants. Sometimes describes the pain as a rope pulling him tight sensation. Worse with certain movements and deep breathing.  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  Clinical Impression  Pt admitted with above diagnosis. Pt currently with functional limitations due to the deficits listed below (see PT Problem List). On eval, pt required max assist for bed mobility. Unable to progress to EOB due to pain and fatigue. Pt currently NPO and scheduled to undergo EGD 12-27-15. Pt will benefit from skilled PT to increase their independence and safety with mobility to allow discharge to the venue listed below.       Follow Up Recommendations CIR    Equipment Recommendations  Wheelchair (measurements PT);Wheelchair cushion (measurements PT);3in1 (PT);Other (comment)    Recommendations for Other Services Rehab consult     Precautions / Restrictions Precautions Precautions: Fall Precaution Comments: Paraplegia, multiple sacral and buttocks wounds; limit time OOB to 2 hours at a time      Mobility  Bed Mobility Overal bed mobility: Needs Assistance;+2 for physical assistance Bed Mobility: Rolling Rolling: Max assist         General bed mobility comments: Unable to progress OOB due to pain and weakness.  Transfers                    Ambulation/Gait                Stairs            Wheelchair Mobility    Modified Rankin (Stroke Patients Only)       Balance                                             Pertinent Vitals/Pain Faces Pain Scale: Hurts whole lot Pain Location: abdomen Pain Descriptors / Indicators: Grimacing;Guarding;Cramping Pain Intervention(s): Limited activity within patient's tolerance;Monitored during session    [place] expects to be discharged to:: Private residence Living Arrangements: Spouse/significant other Available Help at Discharge: Family;Available 24 hours/day Type of Home: House Home Access: Level entry     Home Layout: One level Home Equipment: Walker - 2 wheels      Prior Function Level of Independence: Independent         Comments: 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     Hand Dominance   Dominant Hand: Right    Extremity/Trunk Assessment   Upper Extremity Assessment: Defer to OT evaluation           Lower Extremity Assessment: RLE deficits/detail;LLE deficits/detail RLE Deficits / Details: no sensation below T7, some reflexive-type movement in foot/toes; 0/5 strength with atrophy noted ant tib LLE Deficits / Details: no sensation below T7, some reflexive-type movement in foot/toes; 0/5 strength with atrophy noted ant tib     Communication   Communication: No difficulties (whisper level voice)  Cognition Arousal/Alertness: Awake/alert Behavior During Therapy: Anxious Overall Cognitive Status: Impaired/Different from baseline Area of Impairment: Problem  solving;Memory     Memory: Decreased short-term memory       Problem Solving: Difficulty sequencing;Requires verbal cues;Requires tactile cues;Slow processing      General Comments      Exercises     Assessment/Plan    PT Assessment Patient needs continued PT services  PT Problem List Decreased strength;Decreased activity tolerance;Decreased balance;Decreased mobility;Decreased range of motion;Decreased coordination;Decreased knowledge of use of DME;Decreased knowledge of  precautions;Cardiopulmonary status limiting activity;Pain;Impaired sensation;Impaired tone          PT Treatment Interventions DME instruction;Stair training;Functional mobility training;Therapeutic activities;Therapeutic exercise;Balance training;Patient/family education;Neuromuscular re-education;Wheelchair mobility training;Manual techniques    PT Goals (Current goals can be found in the Care Plan section)  Acute Rehab PT Goals Patient Stated Goal: to go home PT Goal Formulation: With patient Time For Goal Achievement: 01/09/16 Potential to Achieve Goals: Good    Frequency Min 5X/week   Barriers to discharge        Co-evaluation               End of Session   Activity Tolerance: Patient limited by fatigue;Patient limited by pain Patient left: in bed;with call bell/phone within reach Nurse Communication: Mobility status         Time: 1005-1019 PT Time Calculation (min) (ACUTE ONLY): 14 min   Charges:   PT Evaluation $PT Eval Moderate Complexity: 1 Procedure     PT G Codes:        Lorriane Shire 12/26/2015, 11:46 AM

## 2015-12-27 ENCOUNTER — Encounter (HOSPITAL_COMMUNITY): Payer: Self-pay | Admitting: *Deleted

## 2015-12-27 ENCOUNTER — Inpatient Hospital Stay (HOSPITAL_COMMUNITY): Payer: Medicare Other

## 2015-12-27 ENCOUNTER — Inpatient Hospital Stay (HOSPITAL_COMMUNITY): Payer: Medicare Other | Admitting: Anesthesiology

## 2015-12-27 ENCOUNTER — Ambulatory Visit: Payer: Medicare Other

## 2015-12-27 ENCOUNTER — Encounter (HOSPITAL_COMMUNITY)
Admission: AD | Disposition: A | Discharge status: Skilled Nursing Facility | Payer: Self-pay | Source: Ambulatory Visit | Attending: Family Medicine

## 2015-12-27 ENCOUNTER — Ambulatory Visit: Payer: Medicare Other | Admitting: Radiation Oncology

## 2015-12-27 DIAGNOSIS — K264 Chronic or unspecified duodenal ulcer with hemorrhage: Principal | ICD-10-CM

## 2015-12-27 HISTORY — PX: ESOPHAGOGASTRODUODENOSCOPY: SHX5428

## 2015-12-27 HISTORY — PX: IR GENERIC HISTORICAL: IMG1180011

## 2015-12-27 LAB — CBC WITH DIFFERENTIAL/PLATELET
BASOS PCT: 0 %
Basophils Absolute: 0 10*3/uL (ref 0.0–0.1)
EOS ABS: 0 10*3/uL (ref 0.0–0.7)
EOS PCT: 0 %
HCT: 22.4 % — ABNORMAL LOW (ref 39.0–52.0)
HEMOGLOBIN: 7.6 g/dL — AB (ref 13.0–17.0)
LYMPHS ABS: 0.2 10*3/uL — AB (ref 0.7–4.0)
Lymphocytes Relative: 3 %
MCH: 30.9 pg (ref 26.0–34.0)
MCHC: 33.9 g/dL (ref 30.0–36.0)
MCV: 91.1 fL (ref 78.0–100.0)
Monocytes Absolute: 0.2 10*3/uL (ref 0.1–1.0)
Monocytes Relative: 2 %
NEUTROS PCT: 96 %
Neutro Abs: 8.9 10*3/uL — ABNORMAL HIGH (ref 1.7–7.7)
PLATELETS: 175 10*3/uL (ref 150–400)
RBC: 2.46 MIL/uL — AB (ref 4.22–5.81)
RDW: 17 % — ABNORMAL HIGH (ref 11.5–15.5)
WBC: 9.3 10*3/uL (ref 4.0–10.5)

## 2015-12-27 LAB — COMPREHENSIVE METABOLIC PANEL
ALBUMIN: 1.9 g/dL — AB (ref 3.5–5.0)
ALK PHOS: 134 U/L — AB (ref 38–126)
ALT: 26 U/L (ref 17–63)
ANION GAP: 6 (ref 5–15)
AST: 27 U/L (ref 15–41)
BUN: 62 mg/dL — ABNORMAL HIGH (ref 6–20)
CHLORIDE: 112 mmol/L — AB (ref 101–111)
CO2: 23 mmol/L (ref 22–32)
Calcium: 7.5 mg/dL — ABNORMAL LOW (ref 8.9–10.3)
Creatinine, Ser: 0.9 mg/dL (ref 0.61–1.24)
GFR calc non Af Amer: >60 mL/min (ref >60)
GLUCOSE: 131 mg/dL — AB (ref 65–99)
Potassium: 4.6 mmol/L (ref 3.5–5.1)
SODIUM: 141 mmol/L (ref 135–145)
Total Bilirubin: 0.7 mg/dL (ref 0.3–1.2)
Total Protein: 3.8 g/dL — ABNORMAL LOW (ref 6.5–8.1)

## 2015-12-27 LAB — PHOSPHORUS: PHOSPHORUS: 3.6 mg/dL (ref 2.5–4.6)

## 2015-12-27 LAB — GLUCOSE, CAPILLARY
GLUCOSE-CAPILLARY: 100 mg/dL — AB (ref 65–99)
Glucose-Capillary: 124 mg/dL — ABNORMAL HIGH (ref 65–99)
Glucose-Capillary: 133 mg/dL — ABNORMAL HIGH (ref 65–99)

## 2015-12-27 LAB — PREPARE RBC (CROSSMATCH)

## 2015-12-27 LAB — MAGNESIUM: Magnesium: 2.6 mg/dL — ABNORMAL HIGH (ref 1.7–2.4)

## 2015-12-27 SURGERY — EGD (ESOPHAGOGASTRODUODENOSCOPY)
Anesthesia: Monitor Anesthesia Care

## 2015-12-27 MED ORDER — FENTANYL CITRATE (PF) 100 MCG/2ML IJ SOLN
INTRAMUSCULAR | Status: AC
  Filled 2015-12-27: qty 2

## 2015-12-27 MED ORDER — IOPAMIDOL (ISOVUE-300) INJECTION 61%
INTRAVENOUS | Status: AC
  Administered 2015-12-27: 100 mL
  Filled 2015-12-27: qty 100

## 2015-12-27 MED ORDER — EPINEPHRINE PF 1 MG/10ML IJ SOSY
PREFILLED_SYRINGE | INTRAMUSCULAR | Status: AC
  Filled 2015-12-27: qty 10

## 2015-12-27 MED ORDER — PROPOFOL 10 MG/ML IV BOLUS
INTRAVENOUS | Status: DC | PRN
  Administered 2015-12-27: 20 mg via INTRAVENOUS

## 2015-12-27 MED ORDER — LIDOCAINE HCL 1 % IJ SOLN
INTRAMUSCULAR | Status: AC
  Filled 2015-12-27: qty 20

## 2015-12-27 MED ORDER — IOPAMIDOL (ISOVUE-300) INJECTION 61%
INTRAVENOUS | Status: AC
  Administered 2015-12-27: 20 mL
  Filled 2015-12-27: qty 150

## 2015-12-27 MED ORDER — IPRATROPIUM-ALBUTEROL 0.5-2.5 (3) MG/3ML IN SOLN: 3.0000 mL | Freq: Four times a day (QID) | RESPIRATORY_TRACT | Status: DC

## 2015-12-27 MED ORDER — DIPHENHYDRAMINE HCL 50 MG/ML IJ SOLN
INTRAMUSCULAR | Status: AC
  Filled 2015-12-27: qty 1

## 2015-12-27 MED ORDER — DIPHENHYDRAMINE HCL 50 MG/ML IJ SOLN
50.0000 mg | Freq: Once | INTRAMUSCULAR | Status: AC
  Administered 2015-12-27: 50 mg via INTRAVENOUS

## 2015-12-27 MED ORDER — MIDAZOLAM HCL 2 MG/2ML IJ SOLN
INTRAMUSCULAR | Status: AC | PRN
  Administered 2015-12-27: 0.5 mg via INTRAVENOUS

## 2015-12-27 MED ORDER — EPINEPHRINE PF 1 MG/ML IJ SOLN
INTRAMUSCULAR | Status: DC | PRN
  Administered 2015-12-27: 5 mL

## 2015-12-27 MED ORDER — MIDAZOLAM HCL 2 MG/2ML IJ SOLN
INTRAMUSCULAR | Status: AC
  Filled 2015-12-27: qty 2

## 2015-12-27 MED ORDER — PROPOFOL 500 MG/50ML IV EMUL
INTRAVENOUS | Status: DC | PRN
  Administered 2015-12-27: 50 ug/kg/min via INTRAVENOUS

## 2015-12-27 MED ORDER — IPRATROPIUM-ALBUTEROL 0.5-2.5 (3) MG/3ML IN SOLN
3.0000 mL | Freq: Four times a day (QID) | RESPIRATORY_TRACT | Status: DC
  Administered 2015-12-27: 3 mL via RESPIRATORY_TRACT
  Filled 2015-12-27: qty 3

## 2015-12-27 MED ORDER — LACTATED RINGERS IV SOLN
INTRAVENOUS | Status: DC
  Administered 2015-12-27 (×2): via INTRAVENOUS

## 2015-12-27 MED ORDER — HYDROCORTISONE NA SUCCINATE PF 250 MG IJ SOLR
200.0000 mg | Freq: Once | INTRAMUSCULAR | Status: AC
  Administered 2015-12-27: 200 mg via INTRAVENOUS
  Filled 2015-12-27: qty 200

## 2015-12-27 MED ORDER — BUTAMBEN-TETRACAINE-BENZOCAINE 2-2-14 % EX AERO
INHALATION_SPRAY | CUTANEOUS | Status: DC | PRN
  Administered 2015-12-27: 2 via TOPICAL

## 2015-12-27 MED ORDER — FENTANYL CITRATE (PF) 100 MCG/2ML IJ SOLN
INTRAMUSCULAR | Status: AC | PRN
  Administered 2015-12-27: 25 ug via INTRAVENOUS

## 2015-12-27 MED ORDER — SODIUM CHLORIDE 0.9 % IV SOLN
Freq: Once | INTRAVENOUS | Status: AC
  Administered 2015-12-27: 09:00:00 via INTRAVENOUS

## 2015-12-27 MED ORDER — LIDOCAINE HCL 1 % IJ SOLN
INTRAMUSCULAR | Status: AC | PRN
  Administered 2015-12-27: 10 mL

## 2015-12-27 NOTE — H&P (View-Only) (Signed)
Daily Rounding Note  12/26/2015, 9:59 AM  LOS: 1 day   SUBJECTIVE:   Chief complaint:   Nausea.  This started this AM.  Stools are black, loose.  Some abdominal discomfort    OBJECTIVE:         Vital signs in last 24 hours:    Temp:  [97.5 F (36.4 C)-98 F (36.7 C)] 97.8 F (36.6 C) (10/26 0848) Pulse Rate:  [58-70] 62 (10/26 0848) Resp:  [18-20] 19 (10/26 0848) BP: (119-174)/(72-95) 119/95 (10/26 0848) SpO2:  [97 %-100 %] 98 % (10/26 0848) Last BM Date: 12/25/15 There were no vitals filed for this visit. General: looks ill, comfortable.  alert   Heart: RRR.  NSR on monitor.   Chest: clear bil.  No cough.  Some dyspnea with speach Abdomen: soft, NT, BS hypoactive.    Extremities: paralysis in LE Neuro/Psych:  Appropriate.  Not confused.  Speech is slow, halting.    Intake/Output from previous day: 10/25 0701 - 10/26 0700 In: 120 [P.O.:120] Out: -   Intake/Output this shift: No intake/output data recorded.  Lab Results:  Recent Labs  12/25/15 0511 12/25/15 0700 12/25/15 2230 12/26/15 0400  WBC 8.4 6.8  --  9.3  HGB 7.7* 7.4* 9.1* 8.7*  HCT 23.8* 22.7* 26.8* 25.1*  PLT 302 278  --  206   BMET  Recent Labs  12/25/15 0511 12/26/15 0400  NA 140 138  K 4.8 4.5  CL 108 110  CO2 25 24  GLUCOSE 132* 139*  BUN 55* 63*  CREATININE 0.75 0.84  CALCIUM 7.2* 7.2*   LFT  Recent Labs  12/25/15 0511 12/26/15 0400  PROT 4.3* 3.8*  ALBUMIN 1.7* 1.8*  AST 29 32  ALT 20 22  ALKPHOS 67 60  BILITOT 0.6 1.0   PT/INR No results for input(s): LABPROT, INR in the last 72 hours. Hepatitis Panel No results for input(s): HEPBSAG, HCVAB, HEPAIGM, HEPBIGM in the last 72 hours.  Studies/Results: No results found.   Scheduled Meds: . calcitonin (salmon)  1 spray Alternating Nares Daily  . dexamethasone  10 mg Intravenous Q12H  . diltiazem  180 mg Oral Daily  . feeding supplement (ENSURE ENLIVE)  237  mL Oral BID BM  . insulin aspart  0-5 Units Subcutaneous QHS  . insulin aspart  0-9 Units Subcutaneous TID WC  . LORazepam  0.5 mg Oral Daily  . nicotine polacrilex  2 mg Oral Q2H  . [START ON 12/29/2015] pantoprazole  40 mg Intravenous Q12H  . sodium chloride flush  3 mL Intravenous Q12H  . timolol  1 drop Both Eyes BID   Continuous Infusions: . sodium chloride    . pantoprozole (PROTONIX) infusion 8 mg/hr (12/26/15 0625)   PRN Meds:.acetaminophen **OR** acetaminophen, fentaNYL (SUBLIMAZE) injection, LORazepam, methocarbamol (ROBAXIN)  IV, polyethylene glycol, promethazine **OR** promethazine **OR** promethazine, senna-docusate, traZODone   ASSESMENT:   *  Upper GI bleed in patient who is receiving both Eliquis, for new onset A. fib, and Plavix for history of TIA, cerebrovascular disease. Day 2 IV PPI drip.    *  ABL anemia.  Hgb improved post PRBC x 2.      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  Cause of fracture felt to be infectious , malignancy not entirely ruled out..  *  DVT left calf. Discovered on 10/25.  *  Right lung mass.  Has not been biopsied.   *  A fib, RVR.  Eliquis on hold.  Plavix also on hold.    *  Carotid artery atherosclerosis   *  Neurogenic bowel and bladder.    PLAN   *  EGD tentatively set for 10/27.  Continue clears.  Antinauseals.  PPI drip.      Azucena Freed  12/26/2015, 9:59 AM Pager: [phone]  I have discussed the case with the PA, and that is the plan I formulated. I personally interviewed and examined the patient.  He still has reported black stool, but hemoglobin stable after initial transfusion. He is on a PPI drip I will allow him to eat today, then nothing by mouth after midnight for planned upper endoscopy tomorrow. Check hemoglobin again tonight and in the morning and transfuse as needed.    Nelida Meuse III Pager [phone]  Mon-Fri 8a-5p [phone] after 5p, weekends,  holidays

## 2015-12-27 NOTE — Consult Note (Signed)
[facility] Surgery Consult Note  Marcus [name] 08-Sep-1940  [id number].    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 Pain is described as burning and tight. Associated with dark, loose stools and anemia. Denies any other new symptoms including fever, CP, palpitations, hematochezia, or skin changes.   Patient received 2 units of blood ordered by CIR 10/25. GI performed upper endoscopy today significant for anterior duodenal bulb ulcer and a more distal, posterior-lateral ulcer towards the second portion of duodenum. The more distal ulcer and the source of significant bleeding could not be reached with the scope and IR as well as general surgery were asked to consult. Previously on Eloquis and Plavix, both have been held. Has also been on decadron for myelopathy - currently weaning. Has been on IV protonix for 48h.  Previous surgeries include spinal fusions (3), one with anterior approach. Denies PMH other abdominal surgeries.  ROS: All systems reviewed and otherwise negative except for as above  Family History  Problem Relation Age of Onset  . Diabetes Mother   . Cancer Mother   . Hypertension Father   . Stroke Father   . Alcohol abuse Brother     Past Medical History:  Diagnosis Date  . Anxiety state, unspecified   . 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 Carotid artery disease ([place])    a. s/p L CEA 2016.  Marland Kitchen Complication of anesthesia    " I woke up in the recovery room with tube in throat and panicked."  . 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 Essential hypertension e  . Herpes zoster without  mention of complication   . History of hiatal hernia   . Hypertrophy of prostate with urinary obstruction and other lower urinary tract symptoms (LUTS)   . Insomnia, unspecified   . Junctional bradycardia    a. developed this with digoxin 12/2015 requiring cessation.  . Lumbago   . Lung mass    a. 12/2015: right lower lobe mass felt secondary to MSSA. Biopsy of right lung mass revealed inflammation/fibrosis favoring inflammatory process but malignancy not entirely ruled out.  Marland Kitchen MSSA (methicillin susceptible Staphylococcus aureus) septicemia ([place]) 12/23/2015  . Other and unspecified hyperlipidemia   . Paralysis of both lower limbs ([place]) 12/24/2015  . Paroxysmal atrial fibrillation ([place]) 12/14/2015   a. dx 12/2015, in/out on telemetry, digoxin stopped 2/2 junctional bradycardia.  . Pneumonia    bronchial  . Reflux esophagitis   . Seasonal allergies   . Stroke ([place])   . TIA (transient ischemic attack)   . Type II or unspecified type diabetes mellitus without mention of complication, not stated as uncontrolled   . Unspecified vitamin D deficiency   . Urethral stricture unspecified   . Urinary frequency     Past Surgical History:  Procedure Laterality Date  . CARDIAC CATHETERIZATION  1986   normal, Dr Lia Foyer  . ENDARTERECTOMY Left 06/08/2014   Procedure: LEFT CAROTID ARTERY ENDARTERECTOMY;  Surgeon: Rosetta Posner, MD;  Location: [facility];  Service: Vascular;  Laterality: Left;  . EYE SURGERY  2014   cataract  . EYE SURGERY  2013   Retina  . PATCH ANGIOPLASTY Left 06/08/2014   Procedure: WITH DACRON PATCH ANGIOPLASTY;  Surgeon: Rosetta Posner, MD;  Location: [facility];  Service: Vascular;  Laterality: Left;  . SPINAL FUSION  12/91,11/91   obtained bone from left lower leg  . stimulator 1991     spine  . stress thallium  1994    normal;  . stretching bladder  07/2007   Dr Risa Grill  . TEE WITHOUT CARDIOVERSION N/A 12/20/2015   Procedure: TRANSESOPHAGEAL ECHOCARDIOGRAM (TEE);  Surgeon: Dorothy Spark, MD;  Location[facility] ENDOSCOPY;  Service: Cardiovascular;  Laterality: N/A;    Social History:  reports that he quit smoking about 25 years ago. His smoking use included Cigarettes. He has never used smokeless tobacco. He reports that he does not drink alcohol or use drugs.  Allergies:  Allergies  Allergen Reactions  . Codeine Other (See Comments)    constipation  . Demerol [Meperidine] Other (See Comments)    Drops blood pressure   . Contrast Media [Iodinated Diagnostic Agents] Nausea And Vomiting  . Gabapentin Other (See Comments)    Suicidal, depression  . Other Other (See Comments)    All narcotics make pt hallucinate    Medications Prior to Admission  Medication Sig Dispense Refill  . apixaban (ELIQUIS) 5 MG TABS tablet Take 1 tablet (5 mg total) by mouth 2 (two) times daily. 60 tablet   . atorvastatin (LIPITOR) 40 MG tablet TAKE ONE TABLET BY MOUTH ONCE DAILY FOR CHOLESTEROL (Patient taking differently: TAKE ONE TABLET BY MOUTH ONCE DAILY AT BEDTIME FOR CHOLESTEROL) 30 tablet 3  . Cholecalciferol (VITAMIN D3) 5000 units TABS Take 5,000 Units by mouth at bedtime.    . clopidogrel (PLAVIX) 75 MG tablet TAKE 1 TABLET BY MOUTH DAILY 90 tablet 3  . dexamethasone (DECADRON) 10 MG/ML injection Inject 1 mL (10 mg total) into the vein every 6 (six) hours. 1 mL 0  . diltiazem (CARDIZEM CD) 180 MG 24 hr capsule Take 1 capsule (180 mg total) by mouth daily.    . 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 guaiFENesin-dextromethorphan (ROBITUSSIN DM) 100-10 MG/5ML syrup Take 5 mLs by mouth every 4 (four) hours as needed for cough. 118 mL 0  . HYDROcodone-acetaminophen (NORCO/VICODIN) 5-325 MG tablet Take 0.5-1 tablets by mouth every 6 (six) hours as needed for moderate pain.      Marland Kitchen LORazepam (ATIVAN) 0.5 MG tablet Take 0.5 mg by mouth See admin instructions. Take 1 tablet (0.5 mg) by mouth every morning, may also take 1 tablet 2 more times during the day as needed for anxiety  0  . nicotine polacrilex (COMMIT) 2 MG lozenge Take 2 mg by mouth every 2 (two) hours.     . ondansetron (ZOFRAN) 4 MG tablet Take 1 tablet (4 mg total) by mouth every 8 (eight) hours as needed for nausea or vomiting. 20 tablet 0  . polyethylene glycol (MIRALAX / GLYCOLAX) packet Take 17 g by mouth daily. 14 each 0  . senna-docusate (SENOKOT-S) 8.6-50 MG tablet Take 1 tablet by mouth 2 (two) times daily.    . timolol (BETIMOL) 0.5 % ophthalmic solution Place 1 drop into both eyes 2 (two) times daily.    . vancomycin 1,250 mg in sodium chloride  0.9 % 250 mL Inject 1,250 mg into the vein every 12 (twelve) hours.      Blood pressure 134/78, pulse 69, temperature 97.8 F (36.6 C), temperature source Oral, resp. rate (!) 26, weight 207 lb 8 oz (94.1 kg), SpO2 94 %. Physical Exam: General: somnolent, ill-appearing white male who is laying in bed in NAD  HEENT: head is normocephalic, atraumatic. Mouth dry and pale Heart: regular, rate, and rhythm.  No obvious murmurs, gallops, or rubs noted.  Palpable pedal pulses bilaterally Lungs: non-labored, CTABL with diminished breath sounds at lung bases Abd: soft, epigastric tenderness with guarding, hypoactive BS MS: all 4 extremities are symmetrical;  Skin: warm and dry with no masses, lesions, or rashes Psych: appropriate affect. Neuro: A&Ox3, normal speech  Results for orders placed or performed during the hospital encounter of 12/25/15 (from the past 48 hour(s))  Glucose, capillary     Status: Abnormal   Collection Time: 12/25/15 11:45 PM  Result Value Ref Range   Glucose-Capillary 132 (H) 65 - 99 mg/dL  CBC     Status: Abnormal   Collection Time: 12/26/15  4:00 AM  Result Value Ref Range   WBC 9.3 4.0 - 10.5 K/uL   RBC 2.81 (L) 4.22 - 5.81 MIL/uL    Hemoglobin 8.7 (L) 13.0 - 17.0 g/dL   HCT 25.1 (L) 39.0 - 52.0 %   MCV 89.3 78.0 - 100.0 fL   MCH 31.0 26.0 - 34.0 pg   MCHC 34.7 30.0 - 36.0 g/dL   RDW 16.9 (H) 11.5 - 15.5 %   Platelets 206 150 - 400 K/uL  Comprehensive metabolic panel     Status: Abnormal   Collection Time: 12/26/15  4:00 AM  Result Value Ref Range   Sodium 138 135 - 145 mmol/L   Potassium 4.5 3.5 - 5.1 mmol/L   Chloride 110 101 - 111 mmol/L   CO2 24 22 - 32 mmol/L   Glucose, Bld 139 (H) 65 - 99 mg/dL   BUN 63 (H) 6 - 20 mg/dL   Creatinine, Ser 0.84 0.61 - 1.24 mg/dL   Calcium 7.2 (L) 8.9 - 10.3 mg/dL   Total Protein 3.8 (L) 6.5 - 8.1 g/dL   Albumin 1.8 (L) 3.5 - 5.0 g/dL   AST 32 15 - 41 U/L   ALT 22 17 - 63 U/L   Alkaline Phosphatase 60 38 - 126 U/L   Total Bilirubin 1.0 0.3 - 1.2 mg/dL   GFR calc non Af Amer >60 >60 mL/min   GFR calc Af Amer >60 >60 mL/min    Comment: (NOTE) The eGFR has been calculated using the CKD EPI equation. This calculation has not been validated in all clinical situations. eGFR's persistently <60 mL/min signify possible Chronic Kidney Disease.    Anion gap 4 (L) 5 - 15  Glucose, capillary     Status: Abnormal   Collection Time: 12/26/15  7:50 AM  Result Value Ref Range   Glucose-Capillary 131 (H) 65 - 99 mg/dL  Glucose, capillary     Status: Abnormal   Collection Time: 12/26/15 11:35 AM  Result Value Ref Range   Glucose-Capillary 166 (H) 65 - 99 mg/dL  Urinalysis, Routine w reflex microscopic (not at [facility])     Status: Abnormal   Collection Time: 12/26/15  3:15 PM  Result Value Ref Range   Color, Urine YELLOW YELLOW   APPearance CLEAR CLEAR   Specific Gravity, Urine 1.025 1.005 - 1.030   pH 6.0 5.0 - 8.0  Glucose, UA NEGATIVE NEGATIVE mg/dL   Hgb urine dipstick SMALL (A) NEGATIVE   Bilirubin Urine NEGATIVE NEGATIVE   Ketones, ur NEGATIVE NEGATIVE mg/dL   Protein, ur NEGATIVE NEGATIVE mg/dL   Nitrite NEGATIVE NEGATIVE   Leukocytes, UA NEGATIVE NEGATIVE  Urine  microscopic-add on     Status: Abnormal   Collection Time: 12/26/15  3:15 PM  Result Value Ref Range   Squamous Epithelial / LPF 0-5 (A) NONE SEEN   WBC, UA 0-5 0 - 5 WBC/hpf   RBC / HPF 0-5 0 - 5 RBC/hpf   Bacteria, UA FEW (A) NONE SEEN   Casts GRANULAR CAST (A) NEGATIVE   Urine-Other MUCOUS PRESENT   Glucose, capillary     Status: Abnormal   Collection Time: 12/26/15  4:32 PM  Result Value Ref Range   Glucose-Capillary 163 (H) 65 - 99 mg/dL  Hemoglobin and hematocrit, blood     Status: Abnormal   Collection Time: 12/26/15  5:30 PM  Result Value Ref Range   Hemoglobin 8.8 (L) 13.0 - 17.0 g/dL   HCT 26.2 (L) 39.0 - 52.0 %  Glucose, capillary     Status: Abnormal   Collection Time: 12/26/15  8:19 PM  Result Value Ref Range   Glucose-Capillary 204 (H) 65 - 99 mg/dL  Glucose, capillary     Status: Abnormal   Collection Time: 12/26/15 11:19 PM  Result Value Ref Range   Glucose-Capillary 151 (H) 65 - 99 mg/dL  CBC with Differential/Platelet     Status: Abnormal   Collection Time: 12/27/15  5:15 AM  Result Value Ref Range   WBC 9.3 4.0 - 10.5 K/uL   RBC 2.46 (L) 4.22 - 5.81 MIL/uL   Hemoglobin 7.6 (L) 13.0 - 17.0 g/dL   HCT 22.4 (L) 39.0 - 52.0 %   MCV 91.1 78.0 - 100.0 fL   MCH 30.9 26.0 - 34.0 pg   MCHC 33.9 30.0 - 36.0 g/dL   RDW 17.0 (H) 11.5 - 15.5 %   Platelets 175 150 - 400 K/uL   Neutrophils Relative % 96 %   Neutro Abs 8.9 (H) 1.7 - 7.7 K/uL   Lymphocytes Relative 3 %   Lymphs Abs 0.2 (L) 0.7 - 4.0 K/uL   Monocytes Relative 2 %   Monocytes Absolute 0.2 0.1 - 1.0 K/uL   Eosinophils Relative 0 %   Eosinophils Absolute 0.0 0.0 - 0.7 K/uL   Basophils Relative 0 %   Basophils Absolute 0.0 0.0 - 0.1 K/uL  Comprehensive metabolic panel     Status: Abnormal   Collection Time: 12/27/15  5:15 AM  Result Value Ref Range   Sodium 141 135 - 145 mmol/L   Potassium 4.6 3.5 - 5.1 mmol/L   Chloride 112 (H) 101 - 111 mmol/L   CO2 23 22 - 32 mmol/L   Glucose, Bld 131 (H) 65 -  99 mg/dL   BUN 62 (H) 6 - 20 mg/dL   Creatinine, Ser 0.90 0.61 - 1.24 mg/dL   Calcium 7.5 (L) 8.9 - 10.3 mg/dL   Total Protein 3.8 (L) 6.5 - 8.1 g/dL   Albumin 1.9 (L) 3.5 - 5.0 g/dL   AST 27 15 - 41 U/L   ALT 26 17 - 63 U/L   Alkaline Phosphatase 134 (H) 38 - 126 U/L   Total Bilirubin 0.7 0.3 - 1.2 mg/dL   GFR calc non Af Amer >60 >60 mL/min   GFR calc Af Amer >60 >60 mL/min    Comment: (  NOTE) The eGFR has been calculated using the CKD EPI equation. This calculation has not been validated in all clinical situations. eGFR's persistently <60 mL/min signify possible Chronic Kidney Disease.    Anion gap 6 5 - 15  Magnesium     Status: Abnormal   Collection Time: 12/27/15  5:15 AM  Result Value Ref Range   Magnesium 2.6 (H) 1.7 - 2.4 mg/dL  Phosphorus     Status: None   Collection Time: 12/27/15  5:15 AM  Result Value Ref Range   Phosphorus 3.6 2.5 - 4.6 mg/dL  Prepare RBC     Status: None   Collection Time: 12/27/15  7:02 AM  Result Value Ref Range   Order Confirmation ORDER PROCESSED BY BLOOD BANK   Glucose, capillary     Status: Abnormal   Collection Time: 12/27/15  7:26 AM  Result Value Ref Range   Glucose-Capillary 124 (H) 65 - 99 mg/dL   Mr Thoracic Spine Wo Contrast  Result Date: 12/26/2015 CLINICAL DATA:  Re- evaluation of known compression fracture, infection, abscess. EXAM: MRI THORACIC SPINE WITHOUT CONTRAST TECHNIQUE: Multiplanar, multisequence MR imaging of the thoracic spine was performed. No intravenous contrast was administered. COMPARISON:  The prior MRI from 12/16/2015. FINDINGS: Examination is somewhat limited as the patient was unable to tolerate the full length of the exam. No contrast was administered on this study. A Alignment: Alignment is stable with preservation of the normal thoracic kyphosis. No listhesis. Vertebrae: Again seen is abnormal T1 hypo intense signal intensity involving the T6 and T7 vertebral bodies with associated T2/STIR hyperintensity,  compatible with edema. Overall, changes are relatively similar as compared to prior study. Again, changes are contiguous with the right posterior chest wall lesion. The chest wall lesion appears overall decreased in size with decreased inflammatory changes as compared to prior MRI. This lesion measures approximately 3.2 x 2.6 cm on today's study, previously 4.4 x 4.1 cm. While no contrast was administered on this exam, the previously noted dorsal enhancing collection extending from T5 through T8-9 also appears decreased in size from previous exam, most evident on sagittal T2 weighted sequence (series 5, image 9). This collection measures up to 5 mm in maximal thickness at the level of T7-8. Associated spinal canal stenosis with mass effect on the thecal sac is improved, although remains moderate to severe at the T7 level (series 10, image 27). Thecal sac measures 8 mm in AP diameter at this level. Patchy T2 signal abnormality within the cord at T7 and T8 again seen, worrisome for myelopathy. Compression deformity involving the T7 vertebral body is relatively stable with associated approximately 50% height loss and 4 mm bony retropulsion. No other new finding within the thoracic spine. Degenerative disc bulge noted at T12-L1 without stenosis. Layering left pleural effusion noted. IMPRESSION: 1. Similar appearance of abnormal signal intensity involving the T6 and T7 vertebral bodies with associated T7 compression fracture, with direct extension into the right lower lobe pulmonary process. Overall, inflammatory changes within the right lower lobe are improved, with decreased size of right lower lobe lesion as compared to prior MRI from 12/16/2015. Given the interval improvement, changes suggest an improving infectious process. T7 compression fracture with 50% height loss and 4 mm bony retropulsion is stable. 2. Persistent but improved and decreased size of dorsal epidural collection, likely reflecting improved epidural  abscess. 3. Overall improvement in previously seen spinal canal stenosis, although there remains persistent moderate to severe stenosis at the level of T7. 4. Persistent abnormal  signal intensity within the thoracic spinal cord at T7-8, consistent with compressive myelopathy. 5. Layering left pleural effusion. Electronically Signed   By: Jeannine Boga M.D.   On: 12/26/2015 23:38   Assessment/Plan Upper GI bleed secondary to duodenal ulcers Melena Acute blood loss anemia - 10/25 2 units PRBC, Plavix and Eloquis held, PPI drip started - 10/27 Upper Endoscopy, Dr. Loletha Carrow; one, deep non-bleeding ulcer of anterior duodenal bulb, blood in duodenum with source unclear - not able to treat.  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  General surgery will follow.   Jill Alexanders, [facility] Surgery 12/27/2015, 1:55 PM Pager: [phone] Consults: [phone] Mon-Fri 7:00 am-4:30 pm Sat-Sun 7:00 am-11:30 am

## 2015-12-27 NOTE — Progress Notes (Signed)
PROGRESS NOTE    Marcus [name]  [id number] DOB: 11/03/1940 DOA: 12/25/2015 PCP: Jeanmarie Hubert, MD  Brief Narrative:  Marcus [name] is a 75 y.o. 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 Patient's current complaints include intermittent pulling or burning sensation of his epigastric and right upper and left upper quadrants. Sometimes describes the pain as a rope pulling him tight sensation. Worse with certain movements and deep breathing. Sometimes comes on as a crampy sensation that happens spontaneously. Pain typically lasts a few seconds. Per review of patient's recent care on the third floor and discussion of staff there are no reported dark or loose bloody stools prior to being transferred to CIR. Staff on CIR note dark to black stools for the last 24 hours. Denies any other new symptoms such as fevers, cough, shortness of breath, rash, palpitations, headache, neck stiffness. Symptoms appear to be getting worse. EGD done today and showed one non-bleeding ulcer with a no nonbleeding visible vessel as well as 2 other ulcers in the duodenal sweep and second portion but bleeding was coming from an area that was not visualized with the Endoscope. IR was consulted for Angiogram for possible embolization and General Surgery was also consulted as backup.   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 Currently 7.6. Normocytic. CIR staff report dark stools for 24 hrs. 3W staff from previous department report nml stools. Likely secondary to upper GI bleed from initiation of Eliquis.  -Cannot r/o ulceration given recent acute stress and high dose steroids. Will attempt to wean Steroids. Decadron 10 mg IV q6h now 10 mg IV q12h.  - 2 units PRBC per CIR team and 1 unit per GI team today - f/u post transfusion labs - Hold Plavix and Eliquis (resume per GI and Cards recs)  GI bleed:  - Likely upper GI bleed. GI following.  - EGD Done this AM and showed  non-bleeding ulcer with a no nonbleeding visible vessel as well as 2 other ulcers in the duodenal sweep and second portion but bleeding was coming from an area that was not visualized with the Endoscope. - IR was consulted for Angiogram for possible embolization however Negative three-vessel mesenteric arteriogram. No evidence of active extravasation or other focal lesion to suggest etiology of GI bleed.  -General Surgery was also consulted as backup incase Embolization could not be done.  - stop Plavix and Eliquis - IV protonix -GI Dr. Loletha Carrow feels like submucosal epinephrine temporarily slowed or stopped the bleeding, or perhaps the bleeding is below the level of detection on angiogram because it is mucosal bleeding. 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 The patient underwent CT-guided core biopsy of right lower lobe/posterior mediastinal mass lesion 12/18/15. No evidence of malignancy of final pathology report. Likely Inflammation and Fibrosis -Blood cultures done on  admission positive for MSSA, with surveillance blood cultures negative so far. Antibiotics switched to vancomycin 12/22/15 after sputum cultures grew MRSA. PICC line Placed 12/21/15.  -TEE done 12/20/15, negative for endocarditis.  -CIR admitted pt on 10/24 but then DC on 10/25 due to GI bleed - see above.   - 2weeks of vancomycin and a switch of his antibiotics back to Cefazolin to complete a 6 week course of antibiotics - Per ID recs.  - Vanc 10/21 --> -Continue Decadron for myelopathy from compression per Neursurgical recs. - Weaning 10 mg as patient has concern for GI Bleed - Repeat MRI showed Similar appearance of abnormal signal intensity involving the T6 and T7 vertebral bodies with associated T7 compression fracture, with direct extension into the right lower lobe pulmonary process.Overall, inflammatory changes within the right lower lobe areimproved, with decreased size of right lower lobe lesion as compared to prior MRI from 12/16/2015. Given the interval improvement, changes suggest an improving infectious process. T7 compression fracture with 50% height loss and 4 mm bony retropulsion is stable. 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 Overall improvement in previously seen spinal canal stenosis,a lthough there remains persistent moderate to severe stenosis at the level of T7. 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 Layering left pleural effusion. - PT/OT recommend transfer back to CIR once stable.   Urinary retention: -Foley in place. Likely from T7 compression fracture and infection - Continue foley. - outpt f/u w/ Urology - UA  Afib RVR/CAD:  -Currently rate controlled.  -Cards, Dr. Tamala Julian, consulted on day of re-admit due to significant cardiac history on ASA and plavix w/ GI bleed.  - F/u Cards recs regarding  anticoagulation - current recommending restarting Eliquis as monotherapy after GI bleed resolution.  - Tele - Continue Diltiazem 180 mg daily  Bilat LE DVT:  -LE duplex on 10/25 noted DVT in the right soleal veins and the left peroneal veins. Likely from prolonged hospitalization and being bedbound - Hold anticoagulation at this time due to GI bleed - no SCD - Discussed with IR Dr. Anselm Pancoast and he recommended waiting on full GI evaluation prior to IVC Filter placement.   Pain: secondary to T7 compression fracture, infection, GI bleed, and prolonged hospital stay being bed bound. - Fentanyl PRN - Intranasal calcitonin - Tylenol  Anxiety/Insomnia: - continue Ativan - Trazodone QHS prn  Severe Protein calorie malnutrition:  -Albuimin1.7 and total protein 4.3.  -Poor appetite and oral intake since admission.  -Poor prognostic indicator for ability to heal - Nutrition consult - Ensure TID between meals once taking PO - Pre-albumin  H/o TIA/CVA:  - No residual deficits. S/p carotid endarterectomy - DC plavix as above  Pressure ulcer to the sacrum -Evaluated by wound care nurse. Foam dressings ordered.  -Pressure reduction mattress ordered by admitting team. - Air overlay mattress  HLD:  - continue Statin  DVT prophylaxis: None because of GI Bleed Risk and no SCD's because of DVT Code Status: Full Family Communication: Discussed plan of care with wife at beside Disposition Plan: Inpatient Rehab  Consultants:   GI  Cardiology  IR by phone  Infectious Diseases  Procedures: EGD today.   Antimicrobials: Vanc and Cefazolin after completion of Vanc  Subjective: Patient was seen and examined in endoscopy suite after EGD and was breathing heavier and sounded congested. He was very fatigued and felt weaker. He had just come out of EGD and IR was coming to try to do embolization where bleeding was occurring. Stated he felt terrible and was in tremendous pain. No other  complaints or concerns.   Objective: Vitals:   12/27/15 1800 12/27/15 1815 12/27/15 1830 12/27/15 1850  BP: 124/84 (!) 116/95 126/79 112/83  Pulse:      Resp: (!) 21 (!) 23 (!) 27 (!) 23  Temp:      TempSrc:      SpO2:      Weight:        Intake/Output Summary (Last 24 hours) at 12/27/15 2010 Last data filed at 12/27/15 1900  Gross per 24 hour  Intake          1359.58 ml  Output               30 ml  Net          1329.58 ml   Filed Weights   12/27/15 0528  Weight: 94.1 kg (207 lb 8 oz)    Examination: Physical Exam:  Constitutional: Chronic Ill appearing 75 year old male who appears extremly uncompfortable and in pain Eyes: Lids and conjunctivae normal, sclerae anicteric  ENMT: External Ears, Nose appear normal. Grossly normal hearing.  Neck: Appears normal, supple, no cervical masses, normal ROM, no appreciable thyromegaly, no JVD Respiratory: Diminished bilaterally with wheezing and rhonchi. No appreciable crackles. Increased respiratory effort with mild tachypenia. No accessory muscle use.  Cardiovascular: RRR, no murmurs / rubs / gallops. S1 and S2 auscultated. No extremity edema. Abdomen: Soft, non-tender, non-distended. No masses palpated. No appreciable hepatosplenomegaly. Bowel sounds positive.  GU: Deferred. Musculoskeletal: No clubbing / cyanosis of digits/nails. No joint deformity upper and lower extremities. No contractures. Decreased strength and muscle tone in LE still with 0/5 in Strength.   Skin: No rashes, lesions, ulcers. No induration; Warm and dry.  Neurologic: CN 2-12 grossly intact with no focal deficits. Sensation decreased especially from Right Leg to Right Nipple line and Left Leg up until Thigh, Romberg sign cerebellar reflexes not assessed.  Psychiatric: Normal judgment and insight. Alert and oriented x 3. Depressed mood and flat affect.  Data Reviewed: I have personally reviewed following labs and imaging studies  CBC:  Recent Labs Lab  12/21/15 0543 12/25/15 0511 12/25/15 0700 12/25/15 2230 12/26/15 0400 12/26/15 1730 12/27/15 0515  WBC 7.9 8.4 6.8  --  9.3  --  9.3  NEUTROABS  --  8.1*  --   --   --   --  8.9*  HGB 11.5* 7.7* 7.4* 9.1* 8.7* 8.8* 7.6*  HCT 35.3* 23.8* 22.7* 26.8* 25.1* 26.2* 22.4*  MCV 92.4 93.7 93.4  --  89.3  --  91.1  PLT 400 302 278  --  206  --  0000000   Basic Metabolic Panel:  Recent Labs Lab 12/21/15 0543 12/23/15 0738 12/25/15 0511 12/26/15 0400 12/27/15 0515  NA 135  --  140 138 141  K 4.7  --  4.8 4.5 4.6  CL 102  --  108 110 112*  CO2 27  --  25 24 23   GLUCOSE 138*  --  132* 139* 131*  BUN 31*  --  55* 63* 62*  CREATININE 0.70 0.64  0.75 0.84 0.90  CALCIUM 7.9*  --  7.2* 7.2* 7.5*  MG  --   --   --   --  2.6*  PHOS  --   --   --   --  3.6   GFR: Estimated Creatinine Clearance: 81.1 mL/min (by C-G formula based on SCr of 0.9 mg/dL). Liver Function Tests:  Recent Labs Lab 12/25/15 0511 12/26/15 0400 12/27/15 0515  AST 29 32 27  ALT 20 22 26   ALKPHOS 67 60 134*  BILITOT 0.6 1.0 0.7  PROT 4.3* 3.8* 3.8*  ALBUMIN 1.7* 1.8* 1.9*   No results for input(s): LIPASE, AMYLASE in the last 168 hours. No results for input(s): AMMONIA in the last 168 hours. Coagulation Profile: No results for input(s): INR, PROTIME in the last 168 hours. Cardiac Enzymes: No results for input(s): CKTOTAL, CKMB, CKMBINDEX, TROPONINI in the last 168 hours. BNP (last 3 results) No results for input(s): PROBNP in the last 8760 hours. HbA1C: No results for input(s): HGBA1C in the last 72 hours. CBG:  Recent Labs Lab 12/26/15 1632 12/26/15 2019 12/26/15 2319 12/27/15 0726 12/27/15 1707  GLUCAP 163* 204* 151* 124* 100*   Lipid Profile: No results for input(s): CHOL, HDL, LDLCALC, TRIG, CHOLHDL, LDLDIRECT in the last 72 hours. Thyroid Function Tests: No results for input(s): TSH, T4TOTAL, FREET4, T3FREE, THYROIDAB in the last 72 hours. Anemia Panel: No results for input(s): VITAMINB12,  FOLATE, FERRITIN, TIBC, IRON, RETICCTPCT in the last 72 hours. Sepsis Labs: No results for input(s): PROCALCITON, LATICACIDVEN in the last 168 hours.  Recent Results (from the past 240 hour(s))  Culture, expectorated sputum-assessment     Status: None   Collection Time: 12/18/15  9:44 AM  Result Value Ref Range Status   Specimen Description EXPECTORATED SPUTUM  Final   Special Requests NONE  Final   Sputum evaluation   Final    THIS SPECIMEN IS ACCEPTABLE. RESPIRATORY CULTURE REPORT TO FOLLOW.   Report Status 12/18/2015 FINAL  Final  Culture, respiratory (NON-Expectorated)     Status: None   Collection Time: 12/18/15  9:44 AM  Result Value Ref Range Status   Specimen Description EXPECTORATED SPUTUM  Final   Special Requests NONE  Final   Gram Stain   Final    ABUNDANT WBC PRESENT, PREDOMINANTLY PMN FEW BUDDING YEAST SEEN RARE GRAM POSITIVE COCCI IN PAIRS RARE GRAM NEGATIVE COCCOBACILLI    Culture   Final    RARE CANDIDA ALBICANS RARE METHICILLIN RESISTANT STAPHYLOCOCCUS AUREUS    Report Status 12/21/2015 FINAL  Final   Organism ID, Bacteria METHICILLIN RESISTANT STAPHYLOCOCCUS AUREUS  Final      Susceptibility   Methicillin resistant staphylococcus aureus - MIC*    CIPROFLOXACIN <=0.5 SENSITIVE Sensitive     ERYTHROMYCIN <=0.25 SENSITIVE Sensitive     GENTAMICIN <=0.5 SENSITIVE Sensitive     OXACILLIN <=0.25 RESISTANT Resistant     TETRACYCLINE <=1 SENSITIVE Sensitive     VANCOMYCIN 1 SENSITIVE Sensitive     TRIMETH/SULFA <=10 SENSITIVE Sensitive     CLINDAMYCIN <=0.25 SENSITIVE Sensitive     RIFAMPIN <=0.5 SENSITIVE Sensitive     Inducible Clindamycin NEGATIVE Sensitive     * RARE METHICILLIN RESISTANT STAPHYLOCOCCUS AUREUS  Fungus Culture With Stain     Status: None (Preliminary result)   Collection Time: 12/18/15 11:48 AM  Result Value Ref Range Status   Fungus Stain Final report  Final    Comment: (NOTE) Performed At: [facility] [place], [place]  [id number] Lindon Romp MD [id number]    Fungus (Mycology) Culture PENDING  Incomplete   Fungal Source ASPIRATE  Final    Comment:  RLL POSTERIOR MEDIASTINAL MASS AT T7   Acid Fast Smear (AFB)     Status: None   Collection Time: 12/18/15 11:48 AM  Result Value Ref Range Status   AFB Specimen Processing Concentration  Final   Acid Fast Smear Negative  Final    Comment: (NOTE) Performed At: [facility] [place], [place] [id number] Lindon Romp MD [id number]    Source (AFB) ASPIRATE  Final    Comment:  RLL POSTERIOR MEDIASTINAL MASS AT T7   Aerobic/Anaerobic Culture (surgical/deep wound)     Status: None   Collection Time: 12/18/15 11:48 AM  Result Value Ref Range Status   Specimen Description ASPIRATE  Final   Special Requests RLL POSTERIOR MEDIASTINAL MASS AT T7  Final   Gram Stain   Final    RARE WBC PRESENT, PREDOMINANTLY PMN NO ORGANISMS SEEN    Culture FEW STAPHYLOCOCCUS AUREUS NO ANAEROBES ISOLATED   Final   Report Status 12/23/2015 FINAL  Final   Organism ID, Bacteria STAPHYLOCOCCUS AUREUS  Final      Susceptibility   Staphylococcus aureus - MIC*    CIPROFLOXACIN <=0.5 SENSITIVE Sensitive     ERYTHROMYCIN <=0.25 SENSITIVE Sensitive     GENTAMICIN <=0.5 SENSITIVE Sensitive     OXACILLIN <=0.25 SENSITIVE Sensitive     TETRACYCLINE <=1 SENSITIVE Sensitive     VANCOMYCIN 1 SENSITIVE Sensitive     TRIMETH/SULFA <=10 SENSITIVE Sensitive     CLINDAMYCIN <=0.25 SENSITIVE Sensitive     RIFAMPIN <=0.5 SENSITIVE Sensitive     Inducible Clindamycin NEGATIVE Sensitive     * FEW STAPHYLOCOCCUS AUREUS  Fungus Culture Result     Status: None   Collection Time: 12/18/15 11:48 AM  Result Value Ref Range Status   Result 1 Comment  Final    Comment: (NOTE) KOH/Calcofluor preparation:  no fungus observed. Performed At: [facility] [place], [place] [id number] Lindon Romp MD [id number]      Radiology Studies: Mr Thoracic Spine Wo Contrast  Result Date: 12/26/2015 CLINICAL DATA:  Re- evaluation of known compression fracture, infection, abscess. EXAM: MRI THORACIC SPINE WITHOUT CONTRAST TECHNIQUE: Multiplanar, multisequence MR imaging of the thoracic spine was performed. No intravenous contrast was administered. COMPARISON:  The prior MRI from 12/16/2015. FINDINGS: Examination is somewhat limited as the patient was unable to tolerate the full length of the exam. No contrast was administered on this study. A Alignment: Alignment is stable with preservation of the normal thoracic kyphosis. No listhesis. Vertebrae: Again seen is abnormal T1 hypo intense signal intensity involving the T6 and T7 vertebral bodies with associated T2/STIR hyperintensity, compatible with edema. Overall, changes are relatively similar as compared to prior study. Again, changes are contiguous with the right posterior chest wall lesion. The chest wall lesion appears overall decreased in size with decreased inflammatory changes as compared to prior MRI. This lesion measures approximately 3.2 x 2.6 cm on today's study, previously 4.4 x 4.1 cm. While no contrast was administered on this exam, the previously noted dorsal enhancing collection extending from T5 through T8-9 also appears decreased in size from previous exam, most evident on sagittal T2 weighted sequence (series 5, image 9). This collection measures up to 5 mm in maximal thickness at the level of T7-8. Associated spinal canal stenosis with mass effect on the thecal sac  is improved, although remains moderate to severe at the T7 level (series 10, image 27). Thecal sac measures 8 mm in AP diameter at this level. Patchy T2 signal abnormality within the cord at T7 and T8 again seen, worrisome for myelopathy. Compression deformity involving the T7 vertebral body is relatively stable with associated approximately 50% height loss and 4 mm bony retropulsion. No other new  finding within the thoracic spine. Degenerative disc bulge noted at T12-L1 without stenosis. Layering left pleural effusion noted. IMPRESSION: 1. Similar appearance of abnormal signal intensity involving the T6 and T7 vertebral bodies with associated T7 compression fracture, with direct extension into the right lower lobe pulmonary process. Overall, inflammatory changes within the right lower lobe are improved, with decreased size of right lower lobe lesion as compared to prior MRI from 12/16/2015. Given the interval improvement, changes suggest an improving infectious process. T7 compression fracture with 50% height loss and 4 mm bony retropulsion is stable. 2. 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 3. Overall improvement in previously seen spinal canal stenosis, although there remains persistent moderate to severe stenosis at the level of T7. 4. 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 5. Layering left pleural effusion. Electronically Signed   By: Jeannine Boga M.D.   On: 12/26/2015 23:38   Ir Angiogram Visceral Selective  Result Date: 12/27/2015 CLINICAL DATA:  GI bleed. Endoscopy suggests proximal jejunal source. Known duodenal ulcer, but no active bleeding seen on recent endoscopy. EXAM: SELECTIVE VISCERAL ARTERIOGRAPHY; IR ULTRASOUND GUIDANCE VASC ACCESS RIGHT ANESTHESIA/SEDATION: Intravenous Fentanyl and Versed were administered as conscious sedation during continuous monitoring of the patient's level of consciousness and physiological / cardiorespiratory status by the radiology RN, with a total moderate sedation time of 32 minutes. MEDICATIONS: Lidocaine 1% subcutaneous PROCEDURE: The procedure, risks (including but not limited to bleeding, infection, organ damage ), benefits, and alternatives were explained to the patient. Questions  regarding the procedure were encouraged and answered. The patient understands and consents to the procedure. Right femoral region prepped and draped in usual sterile fashion. Maximal barrier sterile technique was utilized including caps, mask, sterile gowns, sterile gloves, sterile drape, hand hygiene and skin antiseptic. The right common femoral artery was localized under ultrasound. Under real-time ultrasound guidance, the vessel was accessed with a 21-gauge micropuncture needle, exchanged over a 018 guidewire for a transitional dilator, through which a 035 guidewire was advanced. Over this, a 5 [place] vascular sheath was placed, through which a 5 [place] C2 catheter was advanced and used to selectively catheterize the celiac axis for selective arteriography in multiple projections. The C2 was then utilized to selectively catheterize the superior mesenteric artery for selective arteriography in multiple projections. The C2 was then exchanged for a RIM catheter, utilized to selectively catheterize the inferior mesenteric artery for selective arteriography in 2 projections. The catheter and sheath were removed and hemostasis achieved with the aid of the Exoseal device after confirmatory femoral arteriography. The patient tolerated the procedure well. COMPLICATIONS: None immediate FINDINGS: No evidence of active extravasation, early draining vein, AVM, or other lesion to suggest a site or etiology of the patient's GI bleeding. 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 Venous phase confirms patency of the IMV and portal venous system. IMPRESSION: 1. Negative three-vessel mesenteric arteriogram. No evidence of active extravasation or other focal lesion to suggest etiology of GI bleed. Electronically Signed  By: Lucrezia Europe M.D.   On: 12/27/2015 16:56   Ir Angiogram Visceral Selective  Result Date: 12/27/2015 CLINICAL DATA:  GI bleed. Endoscopy suggests proximal jejunal  source. Known duodenal ulcer, but no active bleeding seen on recent endoscopy. EXAM: SELECTIVE VISCERAL ARTERIOGRAPHY; IR ULTRASOUND GUIDANCE VASC ACCESS RIGHT ANESTHESIA/SEDATION: Intravenous Fentanyl and Versed were administered as conscious sedation during continuous monitoring of the patient's level of consciousness and physiological / cardiorespiratory status by the radiology RN, with a total moderate sedation time of 32 minutes. MEDICATIONS: Lidocaine 1% subcutaneous PROCEDURE: The procedure, risks (including but not limited to bleeding, infection, organ damage ), benefits, and alternatives were explained to the patient. Questions regarding the procedure were encouraged and answered. The patient understands and consents to the procedure. Right femoral region prepped and draped in usual sterile fashion. Maximal barrier sterile technique was utilized including caps, mask, sterile gowns, sterile gloves, sterile drape, hand hygiene and skin antiseptic. The right common femoral artery was localized under ultrasound. Under real-time ultrasound guidance, the vessel was accessed with a 21-gauge micropuncture needle, exchanged over a 018 guidewire for a transitional dilator, through which a 035 guidewire was advanced. Over this, a 5 [place] vascular sheath was placed, through which a 5 [place] C2 catheter was advanced and used to selectively catheterize the celiac axis for selective arteriography in multiple projections. The C2 was then utilized to selectively catheterize the superior mesenteric artery for selective arteriography in multiple projections. The C2 was then exchanged for a RIM catheter, utilized to selectively catheterize the inferior mesenteric artery for selective arteriography in 2 projections. The catheter and sheath were removed and hemostasis achieved with the aid of the Exoseal device after confirmatory femoral arteriography. The patient tolerated the procedure well. COMPLICATIONS: None immediate  FINDINGS: No evidence of active extravasation, early draining vein, AVM, or other lesion to suggest a site or etiology of the patient's GI bleeding. 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 Venous phase confirms patency of the IMV and portal venous system. IMPRESSION: 1. Negative three-vessel mesenteric arteriogram. No evidence of active extravasation or other focal lesion to suggest etiology of GI bleed. Electronically Signed   By: Lucrezia Europe M.D.   On: 12/27/2015 16:56   Ir Angiogram Visceral Selective  Result Date: 12/27/2015 CLINICAL DATA:  GI bleed. Endoscopy suggests proximal jejunal source. Known duodenal ulcer, but no active bleeding seen on recent endoscopy. EXAM: SELECTIVE VISCERAL ARTERIOGRAPHY; IR ULTRASOUND GUIDANCE VASC ACCESS RIGHT ANESTHESIA/SEDATION: Intravenous Fentanyl and Versed were administered as conscious sedation during continuous monitoring of the patient's level of consciousness and physiological / cardiorespiratory status by the radiology RN, with a total moderate sedation time of 32 minutes. MEDICATIONS: Lidocaine 1% subcutaneous PROCEDURE: The procedure, risks (including but not limited to bleeding, infection, organ damage ), benefits, and alternatives were explained to the patient. Questions regarding the procedure were encouraged and answered. The patient understands and consents to the procedure. Right femoral region prepped and draped in usual sterile fashion. Maximal barrier sterile technique was utilized including caps, mask, sterile gowns, sterile gloves, sterile drape, hand hygiene and skin antiseptic. The right common femoral artery was localized under ultrasound. Under real-time ultrasound guidance, the vessel was accessed with a 21-gauge micropuncture needle, exchanged over a 018 guidewire for a transitional dilator, through which a 035 guidewire was advanced. Over this, a 5 [place] vascular sheath was placed, through which a 5  [place] C2 catheter was advanced and used to selectively catheterize the celiac axis  for selective arteriography in multiple projections. The C2 was then utilized to selectively catheterize the superior mesenteric artery for selective arteriography in multiple projections. The C2 was then exchanged for a RIM catheter, utilized to selectively catheterize the inferior mesenteric artery for selective arteriography in 2 projections. The catheter and sheath were removed and hemostasis achieved with the aid of the Exoseal device after confirmatory femoral arteriography. The patient tolerated the procedure well. COMPLICATIONS: None immediate FINDINGS: No evidence of active extravasation, early draining vein, AVM, or other lesion to suggest a site or etiology of the patient's GI bleeding. 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 Venous phase confirms patency of the IMV and portal venous system. IMPRESSION: 1. Negative three-vessel mesenteric arteriogram. No evidence of active extravasation or other focal lesion to suggest etiology of GI bleed. Electronically Signed   By: Lucrezia Europe M.D.   On: 12/27/2015 16:56   Ir US Guide Vasc Access Right  Result Date: 12/27/2015 CLINICAL DATA:  GI bleed. Endoscopy suggests proximal jejunal source. Known duodenal ulcer, but no active bleeding seen on recent endoscopy. EXAM: SELECTIVE VISCERAL ARTERIOGRAPHY; IR ULTRASOUND GUIDANCE VASC ACCESS RIGHT ANESTHESIA/SEDATION: Intravenous Fentanyl and Versed were administered as conscious sedation during continuous monitoring of the patient's level of consciousness and physiological / cardiorespiratory status by the radiology RN, with a total moderate sedation time of 32 minutes. MEDICATIONS: Lidocaine 1% subcutaneous PROCEDURE: The procedure, risks (including but not limited to bleeding, infection, organ damage ), benefits, and alternatives were explained to the patient. Questions regarding the  procedure were encouraged and answered. The patient understands and consents to the procedure. Right femoral region prepped and draped in usual sterile fashion. Maximal barrier sterile technique was utilized including caps, mask, sterile gowns, sterile gloves, sterile drape, hand hygiene and skin antiseptic. The right common femoral artery was localized under ultrasound. Under real-time ultrasound guidance, the vessel was accessed with a 21-gauge micropuncture needle, exchanged over a 018 guidewire for a transitional dilator, through which a 035 guidewire was advanced. Over this, a 5 [place] vascular sheath was placed, through which a 5 [place] C2 catheter was advanced and used to selectively catheterize the celiac axis for selective arteriography in multiple projections. The C2 was then utilized to selectively catheterize the superior mesenteric artery for selective arteriography in multiple projections. The C2 was then exchanged for a RIM catheter, utilized to selectively catheterize the inferior mesenteric artery for selective arteriography in 2 projections. The catheter and sheath were removed and hemostasis achieved with the aid of the Exoseal device after confirmatory femoral arteriography. The patient tolerated the procedure well. COMPLICATIONS: None immediate FINDINGS: No evidence of active extravasation, early draining vein, AVM, or other lesion to suggest a site or etiology of the patient's GI bleeding. 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 Venous phase confirms patency of the IMV and portal venous system. IMPRESSION: 1. Negative three-vessel mesenteric arteriogram. No evidence of active extravasation or other focal lesion to suggest etiology of GI bleed. Electronically Signed   By: Lucrezia Europe M.D.   On: 12/27/2015 16:56   Dg Chest Port 1 View  Result Date: 12/27/2015 CLINICAL DATA:  Shortness of breath.  Post EGD for GI bleed. EXAM: PORTABLE CHEST 1 VIEW  COMPARISON:  12/17/2015 and CT chest 12/14/2015 FINDINGS: Interval placement of right-sided PICC line with tip over the SVC. Lungs are adequately inflated with moderate interval improvement in the airspace process over the medial superior segment  right lower lobe. No effusion. Cardiomediastinal silhouette is within normal. There is minimal calcified plaque over the aortic arch. Remainder of the exam is unchanged. IMPRESSION: Interval improvement in airspace process over the medial aspect superior segment right lower lobe likely resolving pneumonia. Right-sided PICC line with tip over the SVC. Aortic atherosclerosis. Electronically Signed   By: Marin Olp M.D.   On: 12/27/2015 14:02    Scheduled Meds: . calcitonin (salmon)  1 spray Alternating Nares Daily  . dexamethasone  10 mg Intravenous Q12H  . diltiazem  180 mg Oral Daily  . diphenhydrAMINE      . feeding supplement  1 Container Oral TID BM  . fentaNYL      . insulin aspart  0-5 Units Subcutaneous QHS  . insulin aspart  0-9 Units Subcutaneous TID WC  . ipratropium-albuterol  3 mL Nebulization Q6H  . lidocaine      . LORazepam  0.5 mg Oral Daily  . midazolam      . nicotine polacrilex  2 mg Oral Q2H  . [START ON 12/29/2015] pantoprazole  40 mg Intravenous Q12H  . sodium chloride flush  3 mL Intravenous Q12H  . timolol  1 drop Both Eyes BID   Continuous Infusions: . lactated ringers 10 mL/hr at 12/27/15 1026  . pantoprozole (PROTONIX) infusion 8 mg/hr (12/27/15 0428)    LOS: 2 days   Kerney Elbe, DO [facility] Pager [phone]  If 7PM-7AM, please contact night-coverage [email or web address] Password [email or web address] 12/27/2015, 8:10 PM

## 2015-12-27 NOTE — Anesthesia Preprocedure Evaluation (Signed)
Anesthesia Evaluation  Patient identified by MRN, date of birth, ID band Patient awake    Reviewed: Allergy & Precautions, NPO status , Patient's Chart, lab work & pertinent test results  Airway Mallampati: II  TM Distance: >3 FB Neck ROM: Full    Dental  (+) Teeth Intact, Dental Advisory Given   Pulmonary former smoker,     + decreased breath sounds      Cardiovascular hypertension,  Rhythm:Irregular Rate:Normal     Neuro/Psych    GI/Hepatic   Endo/Other  diabetes  Renal/GU      Musculoskeletal   Abdominal   Peds  Hematology   Anesthesia Other Findings   Reproductive/Obstetrics                             Anesthesia Physical Anesthesia Plan  ASA: III  Anesthesia Plan: MAC   Post-op Pain Management:    Induction: Intravenous  Airway Management Planned: Nasal Cannula and Natural Airway  Additional Equipment:   Intra-op Plan:   Post-operative Plan:   Informed Consent:   Dental advisory given  Plan Discussed with: CRNA and Anesthesiologist  Anesthesia Plan Comments:         Anesthesia Quick Evaluation

## 2015-12-27 NOTE — IPOC Note (Signed)
No IPOC completed due to early transfer back to acute care

## 2015-12-27 NOTE — Sedation Documentation (Signed)
Vital signs stable. 

## 2015-12-27 NOTE — Sedation Documentation (Signed)
Patient is resting comfortably. No complaints from pt at this time. Tolerating procedure well.

## 2015-12-27 NOTE — Progress Notes (Signed)
I reviewed the angiogram report.  No bleeding seen.  This may either be because the submucosal epinephrine temporarily slowed or stopped the bleeding, or perhaps the bleeding is below the level of detection on angiogram because it is mucosal bleeding.  If bleeding continues, he may yet require surgical management because it is not amenable to endoscopic control.

## 2015-12-27 NOTE — Progress Notes (Signed)
Patient ID: Marcus [name], male   DOB: 05-21-1940, 75 y.o.   MRN: [id number]    Referring Physician(s): Dr. 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  Please see that consult for full details regarding initial reason for admission.  He went to rehab earlier this week and he began having melanotic stools.  His hgb dropped from 11 to 7g.  He was transfused several units of blood.  He was on eliquis for a.fib but this was stopped.  He has continued to have some melena and was taken to the endo suite today for an Upper endo.  He was found to have an anterior duodenal bulb ulcer, but was not the source of bleeding.  There was refluxing of blood distal to this in the c-loop portion of the duodenum.  We have been asked to see the patient for an angiogram for possible embolization.  Allergies: Codeine; Demerol [meperidine]; Contrast media [iodinated diagnostic agents]; Gabapentin; and Other  Medications: Prior to Admission medications   Medication Sig Start Date End Date Taking? Authorizing Provider  apixaban (ELIQUIS) 5 MG TABS tablet Take 1 tablet (5 mg total) by mouth 2 (two) times daily. 12/24/15   Venetia Maxon Rama, MD  atorvastatin (LIPITOR) 40 MG tablet TAKE ONE TABLET BY MOUTH ONCE DAILY FOR CHOLESTEROL Patient taking differently: TAKE ONE TABLET BY MOUTH ONCE DAILY AT BEDTIME FOR CHOLESTEROL 11/20/15   Estill Dooms, MD  Cholecalciferol (VITAMIN D3) 5000 units TABS Take 5,000 Units by mouth at bedtime.    Historical Provider, MD  clopidogrel (PLAVIX) 75 MG tablet TAKE 1 TABLET BY MOUTH DAILY 07/04/15   Rosetta Posner, MD  dexamethasone (DECADRON) 10 MG/ML injection Inject 1 mL (10 mg total) into the vein every 6 (six) hours. 12/24/15   Venetia Maxon Rama, MD  diltiazem (CARDIZEM CD) 180 MG  24 hr capsule Take 1 capsule (180 mg total) by mouth daily. 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 12/24/15   Venetia Maxon Rama, MD  HYDROcodone-acetaminophen (NORCO/VICODIN) 5-325 MG tablet Take 0.5-1 tablets by mouth every 6 (six) hours as needed for moderate pain.     Historical Provider, MD  LORazepam (ATIVAN) 0.5 MG tablet Take 0.5 mg by mouth See admin instructions. Take 1 tablet (0.5 mg) by mouth every morning, may also take 1 tablet 2 more times during the day as needed for anxiety 11/21/15   Historical Provider, MD  nicotine polacrilex (COMMIT) 2 MG lozenge Take 2 mg by mouth every 2 (two) hours.     Historical Provider, MD  ondansetron (ZOFRAN) 4 MG tablet Take 1 tablet (4 mg total) by mouth every 8 (eight) hours as needed for nausea or vomiting. 12/02/15   Lauree Chandler, NP  polyethylene glycol (MIRALAX / GLYCOLAX) packet Take 17 g by mouth daily. 12/25/15   Christina P Rama, MD  senna-docusate (SENOKOT-S) 8.6-50 MG tablet Take 1 tablet by mouth 2 (  two) times daily. 12/24/15   Christina P Rama, MD  timolol (BETIMOL) 0.5 % ophthalmic solution Place 1 drop into both eyes 2 (two) times daily.    Historical Provider, MD  vancomycin 1,250 mg in sodium chloride 0.9 % 250 mL Inject 1,250 mg into the vein every 12 (twelve) hours. 12/24/15   Venetia Maxon Rama, MD    Vital Signs: BP (!) 147/66   Pulse 69   Temp 97.8 F (36.6 C) (Oral)   Resp (!) 21   Wt 207 lb 8 oz (94.1 kg)   SpO2 99%   BMI 32.50 kg/m   Physical Exam: Gen: ill-appearing while male  Heart: regular Lungs: diminished BS, but otherwise  clear.  Upper airway rattling noted Abd: soft, minimally tender, +BS  Imaging: Mr Thoracic Spine Wo Contrast  Result Date: 12/26/2015 CLINICAL DATA:  Re- evaluation of known compression fracture, infection, abscess. EXAM: MRI THORACIC SPINE WITHOUT CONTRAST TECHNIQUE: Multiplanar, multisequence MR imaging of the thoracic spine was performed. No intravenous contrast was administered. COMPARISON:  The prior MRI from 12/16/2015. FINDINGS: Examination is somewhat limited as the patient was unable to tolerate the full length of the exam. No contrast was administered on this study. A Alignment: Alignment is stable with preservation of the normal thoracic kyphosis. No listhesis. Vertebrae: Again seen is abnormal T1 hypo intense signal intensity involving the T6 and T7 vertebral bodies with associated T2/STIR hyperintensity, compatible with edema. Overall, changes are relatively similar as compared to prior study. Again, changes are contiguous with the right posterior chest wall lesion. The chest wall lesion appears overall decreased in size with decreased inflammatory changes as compared to prior MRI. This lesion measures approximately 3.2 x 2.6 cm on today's study, previously 4.4 x 4.1 cm. While no contrast was administered on this exam, the previously noted dorsal enhancing collection extending from T5 through T8-9 also appears decreased in size from previous exam, most evident on sagittal T2 weighted sequence (series 5, image 9). This collection measures up to 5 mm in maximal thickness at the level of T7-8. Associated spinal canal stenosis with mass effect on the thecal sac is improved, although remains moderate to severe at the T7 level (series 10, image 27). Thecal sac measures 8 mm in AP diameter at this level. Patchy T2 signal abnormality within the cord at T7 and T8 again seen, worrisome for myelopathy. Compression deformity involving the T7 vertebral body is relatively stable with associated approximately  50% height loss and 4 mm bony retropulsion. No other new finding within the thoracic spine. Degenerative disc bulge noted at T12-L1 without stenosis. Layering left pleural effusion noted. IMPRESSION: 1. Similar appearance of abnormal signal intensity involving the T6 and T7 vertebral bodies with associated T7 compression fracture, with direct extension into the right lower lobe pulmonary process. Overall, inflammatory changes within the right lower lobe are improved, with decreased size of right lower lobe lesion as compared to prior MRI from 12/16/2015. Given the interval improvement, changes suggest an improving infectious process. T7 compression fracture with 50% height loss and 4 mm bony retropulsion is stable. 2. 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 3. Overall improvement in previously seen spinal canal stenosis, although there remains persistent moderate to severe stenosis at the level of T7. 4. 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 5. Layering left pleural effusion. Electronically Signed   By: Jeannine Boga M.D.   On:  12/26/2015 23:38   Dg Chest Port 1 View  Result Date: 12/27/2015 CLINICAL DATA:  Shortness of breath.  Post EGD for GI bleed. EXAM: PORTABLE CHEST 1 VIEW COMPARISON:  12/17/2015 and CT chest 12/14/2015 FINDINGS: Interval placement of right-sided PICC line with tip over the SVC. Lungs are adequately inflated with moderate interval improvement in the airspace process over the medial superior segment right lower lobe. No effusion. Cardiomediastinal silhouette is within normal. There is minimal calcified plaque over the aortic arch. Remainder of the exam is unchanged. IMPRESSION: Interval improvement in airspace process over the medial aspect superior segment right lower lobe likely resolving pneumonia. Right-sided PICC line  with tip over the SVC. Aortic atherosclerosis. Electronically Signed   By: Marin Olp M.D.   On: 12/27/2015 14:02    Labs:  CBC:  Recent Labs  12/25/15 0511 12/25/15 0700 12/25/15 2230 12/26/15 0400 12/26/15 1730 12/27/15 0515  WBC 8.4 6.8  --  9.3  --  9.3  HGB 7.7* 7.4* 9.1* 8.7* 8.8* 7.6*  HCT 23.8* 22.7* 26.8* 25.1* 26.2* 22.4*  PLT 302 278  --  206  --  175    COAGS:  Recent Labs  12/14/15 1721  INR 1.04    BMP:  Recent Labs  12/21/15 0543 12/23/15 0738 12/25/15 0511 12/26/15 0400 12/27/15 0515  NA 135  --  140 138 141  K 4.7  --  4.8 4.5 4.6  CL 102  --  108 110 112*  CO2 27  --  25 24 23   GLUCOSE 138*  --  132* 139* 131*  BUN 31*  --  55* 63* 62*  CALCIUM 7.9*  --  7.2* 7.2* 7.5*  CREATININE 0.70 0.64 0.75 0.84 0.90  GFRNONAA >60 >60 >60 >60 >60  GFRAA >60 >60 >60 >60 >60    LIVER FUNCTION TESTS:  Recent Labs  12/19/15 0356 12/25/15 0511 12/26/15 0400 12/27/15 0515  BILITOT 0.3 0.6 1.0 0.7  AST 69* 29 32 27  ALT 37 20 22 26   ALKPHOS 114 67 60 134*  PROT 5.4* 4.3* 3.8* 3.8*  ALBUMIN 1.5* 1.7* 1.8* 1.9*    Assessment and Plan: 1. UGI bleed - we will plan to proceed with an angiogram for possible embolization if we are able to visualize any active bleeding.  I thoroughly discussed with the patient and his wife that sometimes if people are not bleeding fast enough or have stopped, then we can not see anything on angio and are therefore unable to perform an embolization.  They understand this. -he has a weak contrast allergy of nausea and emesis.  We will still proceed with emergency prophylaxis for contrast allergy of cortef and benadryl. -Risks and Benefits discussed with the patient including, but not limited to bleeding, infection, vascular injury or contrast induced renal failure. All of the patient's questions were answered, patient is agreeable to proceed. Consent signed and in chart.  Electronically Signed: Henreitta Cea 12/27/2015, 2:52 PM   I spent a total of 35 Minutes at the the patient's bedside AND on the patient's hospital floor or unit, greater than 50% of which was counseling/coordinating care for UGI bleed

## 2015-12-27 NOTE — Sedation Documentation (Signed)
Procedure finished. IR Tech placing 87F Exoseal to R groin.

## 2015-12-27 NOTE — Op Note (Signed)
[facility] Patient Name: Marcus [name] Procedure Date : 12/27/2015 MRN: [id number] Attending MD: Estill Cotta. Loletha Carrow , MD Date of Birth: 1941/01/20 CSN: [id number] Age: 75 Admit Type: Inpatient Procedure:                Upper GI endoscopy Indications:              Acute post hemorrhagic anemia, Melena Providers:                Mallie Mussel L. Loletha Carrow, MD, Elna Breslow, RN, Alfonso Patten,                            Technician, Gershon Crane, CRNA Referring MD:              Medicines:                Monitored Anesthesia Care Complications:            No immediate complications. Estimated Blood Loss:     Estimated blood loss was minimal. Procedure:                Pre-Anesthesia Assessment:                           - Prior to the procedure, a History and Physical                            was performed, and patient medications and                            allergies were reviewed. The patient's tolerance of                            previous anesthesia was also reviewed. The risks                            and benefits of the procedure and the sedation                            options and risks were discussed with the patient.                            All questions were answered, and informed consent                            was obtained. Prior Anticoagulants: The patient has                            taken Eliquis (apixaban), last dose was 2 days                            prior to procedure. ASA Grade Assessment: III - A                            patient with severe systemic disease. After  reviewing the risks and benefits, the patient was                            deemed in satisfactory condition to undergo the                            procedure.                           After obtaining informed consent, the endoscope was                            passed under direct vision. Throughout the                            procedure, the patient's  blood pressure, pulse, and                            oxygen saturations were monitored continuously. The                            [id number]) scope was introduced through the                            mouth, and advanced to the second part of duodenum.                            The upper GI endoscopy was performed with moderate                            difficulty due to excessive bleeding. The patient                            tolerated the procedure well. Scope In: Scope Out: Findings:      The esophagus was normal.      The stomach was normal.      The cardia and gastric fundus were normal on retroflexion.      One non-bleeding cratered duodenal ulcer with a tiny amount adherent red       blood and possible flat visible vessel was found in the duodenal bulb.       The lesion was 10 mm in largest dimension. However, this was not the       culprit for current bleeding.      Red blood was found in the entire duodenum. There were at least 2 other       shallow, linear clean-based ulcers in the duodenal sweep and second       portion. The bleeding was coming from an area in the postero-lateral       wall of the proximal duodenal sweep that could not be visualized with       the scope due to its position. Area was injected with 5 mL of a 1:10,000       solution of epinephrine for hemostasis, but that did not appear to       decrease the bleeding. The exact source could not be visualized any  better for additional endoscopic hemostatic therapy. Impression:               - Normal esophagus.                           - Normal stomach.                           - One non-bleeding duodenal ulcer with a                            nonbleeding visible vessel (Forrest Class IIa).                           - Blood in the entire examined duodenum.Source                            unclear, most likely another ulcer. Treatment not                            successful.                            - No specimens collected. Moderate Sedation:      MAC sedation used Recommendation:           - NPO.                           - Interventional radiology today for angiography                            and attempt at bleeding control.                           - Continue present medications. 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 All rights reserved. The codes documented in this report are preliminary and upon coder review may  be revised to meet current compliance requirements. Henry L. Loletha Carrow, MD 12/27/2015 12:37:29 PM This report has been signed electronically. Number of Addenda: 0

## 2015-12-27 NOTE — Sedation Documentation (Signed)
Patient is resting. Vitals stable. 

## 2015-12-27 NOTE — Transfer of Care (Signed)
Immediate Anesthesia Transfer of Care Note  Patient: Marcus [name]  Procedure(s) Performed: Procedure(s): ESOPHAGOGASTRODUODENOSCOPY (EGD) (N/A)  Patient Location: PACU  Anesthesia Type:MAC  Level of Consciousness: awake, alert , oriented and patient cooperative  Airway & Oxygen Therapy: Patient Spontanous Breathing and Patient connected to face mask oxygen  Post-op Assessment: Report given to RN and Post -op Vital signs reviewed and stable  Post vital signs: Reviewed and stable  Last Vitals:  Vitals:   12/27/15 1101 12/27/15 1236  BP: 134/74 (!) 146/77  Pulse: (!) 58 63  Resp: 17 (!) 26  Temp: 36.4 C 36.6 C    Last Pain:  Vitals:   12/27/15 1236  TempSrc: Oral  PainSc: 7       Patients Stated Pain Goal: 7 ([id number])  Complications: No apparent anesthesia complications

## 2015-12-27 NOTE — Interval H&P Note (Signed)
History and Physical Interval Note:  12/27/2015 11:26 AM  Marcus [name]  has presented today for surgery, with the diagnosis of gi bleed, melena, blood loss anemia.  on plavix and eliquis  The various methods of treatment have been discussed with the patient and family. After consideration of risks, benefits and other options for treatment, the patient has consented to  Procedure(s): ESOPHAGOGASTRODUODENOSCOPY (EGD) (N/A) as a surgical intervention .  The patient's history has been reviewed, patient examined, no change in status, stable for surgery.  I have reviewed the patient's chart and labs.  Questions were answered to the patient's satisfaction.   I gave him another unit of PRBCs this AM Nelida Meuse III

## 2015-12-27 NOTE — Sedation Documentation (Signed)
Patient is resting comfortably. No complaints at this time 

## 2015-12-28 ENCOUNTER — Other Ambulatory Visit: Payer: Self-pay | Admitting: Internal Medicine

## 2015-12-28 DIAGNOSIS — I824Z2 Acute embolism and thrombosis of unspecified deep veins of left distal lower extremity: Secondary | ICD-10-CM

## 2015-12-28 DIAGNOSIS — D72829 Elevated white blood cell count, unspecified: Secondary | ICD-10-CM

## 2015-12-28 LAB — COMPREHENSIVE METABOLIC PANEL
ALBUMIN: 1.9 g/dL — AB (ref 3.5–5.0)
ALT: 27 U/L (ref 17–63)
ANION GAP: 5 (ref 5–15)
AST: 28 U/L (ref 15–41)
Alkaline Phosphatase: 65 U/L (ref 38–126)
BUN: 49 mg/dL — ABNORMAL HIGH (ref 6–20)
CO2: 23 mmol/L (ref 22–32)
Calcium: 7.5 mg/dL — ABNORMAL LOW (ref 8.9–10.3)
Chloride: 113 mmol/L — ABNORMAL HIGH (ref 101–111)
Creatinine, Ser: 0.81 mg/dL (ref 0.61–1.24)
GFR calc non Af Amer: >60 mL/min (ref >60)
GLUCOSE: 132 mg/dL — AB (ref 65–99)
POTASSIUM: 4.5 mmol/L (ref 3.5–5.1)
SODIUM: 141 mmol/L (ref 135–145)
Total Bilirubin: 0.8 mg/dL (ref 0.3–1.2)
Total Protein: 4 g/dL — ABNORMAL LOW (ref 6.5–8.1)

## 2015-12-28 LAB — TYPE AND SCREEN
ABO/RH(D): O NEG
Antibody Screen: NEGATIVE
UNIT DIVISION: 0
UNIT DIVISION: 0
Unit division: 0

## 2015-12-28 LAB — CBC
HEMATOCRIT: 27.6 % — AB (ref 39.0–52.0)
HEMOGLOBIN: 9.3 g/dL — AB (ref 13.0–17.0)
MCH: 31.5 pg (ref 26.0–34.0)
MCHC: 33.7 g/dL (ref 30.0–36.0)
MCV: 93.6 fL (ref 78.0–100.0)
Platelets: 145 10*3/uL — ABNORMAL LOW (ref 150–400)
RBC: 2.95 MIL/uL — AB (ref 4.22–5.81)
RDW: 17.2 % — ABNORMAL HIGH (ref 11.5–15.5)
WBC: 12.2 10*3/uL — ABNORMAL HIGH (ref 4.0–10.5)

## 2015-12-28 LAB — PHOSPHORUS: PHOSPHORUS: 3.6 mg/dL (ref 2.5–4.6)

## 2015-12-28 LAB — GLUCOSE, CAPILLARY
GLUCOSE-CAPILLARY: 133 mg/dL — AB (ref 65–99)
GLUCOSE-CAPILLARY: 163 mg/dL — AB (ref 65–99)
GLUCOSE-CAPILLARY: 198 mg/dL — AB (ref 65–99)
Glucose-Capillary: 124 mg/dL — ABNORMAL HIGH (ref 65–99)

## 2015-12-28 LAB — HEMOGLOBIN AND HEMATOCRIT, BLOOD
HEMATOCRIT: 24.4 % — AB (ref 39.0–52.0)
HEMOGLOBIN: 8.2 g/dL — AB (ref 13.0–17.0)

## 2015-12-28 LAB — MAGNESIUM: MAGNESIUM: 2.6 mg/dL — AB (ref 1.7–2.4)

## 2015-12-28 MED ORDER — HYDROCODONE-ACETAMINOPHEN 5-325 MG PO TABS
1.0000 | ORAL_TABLET | Freq: Four times a day (QID) | ORAL | Status: DC | PRN
  Administered 2015-12-28 – 2015-12-29 (×3): 1 via ORAL
  Filled 2015-12-28 (×3): qty 1

## 2015-12-28 MED ORDER — IPRATROPIUM-ALBUTEROL 0.5-2.5 (3) MG/3ML IN SOLN
3.0000 mL | Freq: Three times a day (TID) | RESPIRATORY_TRACT | Status: DC
  Administered 2015-12-28 – 2016-01-06 (×26): 3 mL via RESPIRATORY_TRACT
  Filled 2015-12-28 (×28): qty 3

## 2015-12-28 MED ORDER — METHOCARBAMOL 500 MG PO TABS: 500.0000 mg | ORAL_TABLET | Freq: Three times a day (TID) | ORAL | Status: DC | PRN

## 2015-12-28 NOTE — Progress Notes (Signed)
Daily Rounding Note  12/28/2015, 9:44 AM  LOS: 3 days   SUBJECTIVE:   Chief complaint: melena  Intense RUQ abdominal pain earlier this AM.  Both pain and queasness relieved by [place] ice. Requesting chocolate ice cream.  Has not touched his scrambled eggs or bagel.  No breathing problems.  Asking who Dr Alfredia Ferguson is, complaining that he is waking pt up at all hours of night.   OBJECTIVE:         Vital signs in last 24 hours:    Temp:  [97.4 F (36.3 C)-97.9 F (36.6 C)] 97.7 F (36.5 C) (10/28 0733) Pulse Rate:  [58-75] 66 (10/28 0733) Resp:  [15-27] 22 (10/28 0735) BP: (103-153)/(66-104) 116/82 (10/28 0841) SpO2:  [96 %-100 %] 97 % (10/28 0922) Weight:  [94.7 kg (208 lb 12.8 oz)] 94.7 kg (208 lb 12.8 oz) (10/28 0453) Last BM Date: 12/27/15 Filed Weights   12/27/15 0528 12/28/15 0453  Weight: 94.1 kg (207 lb 8 oz) 94.7 kg (208 lb 12.8 oz)   General: not acutely ill.  Comfortable.    Heart: RRR Chest: clear. Abdomen: soft, NT.  ND.  BS present  Extremities: no CCE.   Neuro/Psych:  Odd affect, speech halting and not fluid. Tends to long, rambling conversation even if asked simple yes/no questions.  Paralysis in LE.  When asked the year, he answered with his birthdate.   Intake/Output from previous day: 10/27 0701 - 10/28 0700 In: 1045 [I.V.:750; Blood:295] Out: [id number] [Urine:1750; Blood:30]  Intake/Output this shift: No intake/output data recorded.  Lab Results:  Recent Labs  12/26/15 0400 12/26/15 1730 12/27/15 0515  WBC 9.3  --  9.3  HGB 8.7* 8.8* 7.6*  HCT 25.1* 26.2* 22.4*  PLT 206  --  175   BMET  Recent Labs  12/26/15 0400 12/27/15 0515 12/28/15 0510  NA 138 141 141  K 4.5 4.6 4.5  CL 110 112* 113*  CO2 24 23 23   GLUCOSE 139* 131* 132*  BUN 63* 62* 49*  CREATININE 0.84 0.90 0.81  CALCIUM 7.2* 7.5* 7.5*   LFT  Recent Labs  12/26/15 0400 12/27/15 0515 12/28/15 0510  PROT 3.8* 3.8*  4.0*  ALBUMIN 1.8* 1.9* 1.9*  AST 32 27 28  ALT 22 26 27   ALKPHOS 60 134* 65  BILITOT 1.0 0.7 0.8   PT/INR No results for input(s): LABPROT, INR in the last 72 hours. Hepatitis Panel No results for input(s): HEPBSAG, HCVAB, HEPAIGM, HEPBIGM in the last 72 hours.  Studies/Results: Mr Thoracic Spine Wo Contrast  Result Date: 12/26/2015 CLINICAL DATA:  Re- evaluation of known compression fracture, infection, abscess. EXAM: MRI THORACIC SPINE WITHOUT CONTRAST TECHNIQUE: Multiplanar, multisequence MR imaging of the thoracic spine was performed. No intravenous contrast was administered. COMPARISON:  The prior MRI from 12/16/2015. FINDINGS: Examination is somewhat limited as the patient was unable to tolerate the full length of the exam. No contrast was administered on this study. A Alignment: Alignment is stable with preservation of the normal thoracic kyphosis. No listhesis. Vertebrae: Again seen is abnormal T1 hypo intense signal intensity involving the T6 and T7 vertebral bodies with associated T2/STIR hyperintensity, compatible with edema. Overall, changes are relatively similar as compared to prior study. Again, changes are contiguous with the right posterior chest wall lesion. The chest wall lesion appears overall decreased in size with decreased inflammatory changes as compared to prior MRI. This lesion measures approximately 3.2 x 2.6 cm on today's study,  previously 4.4 x 4.1 cm. While no contrast was administered on this exam, the previously noted dorsal enhancing collection extending from T5 through T8-9 also appears decreased in size from previous exam, most evident on sagittal T2 weighted sequence (series 5, image 9). This collection measures up to 5 mm in maximal thickness at the level of T7-8. Associated spinal canal stenosis with mass effect on the thecal sac is improved, although remains moderate to severe at the T7 level (series 10, image 27). Thecal sac measures 8 mm in AP diameter at  this level. Patchy T2 signal abnormality within the cord at T7 and T8 again seen, worrisome for myelopathy. Compression deformity involving the T7 vertebral body is relatively stable with associated approximately 50% height loss and 4 mm bony retropulsion. No other new finding within the thoracic spine. Degenerative disc bulge noted at T12-L1 without stenosis. Layering left pleural effusion noted. IMPRESSION: 1. Similar appearance of abnormal signal intensity involving the T6 and T7 vertebral bodies with associated T7 compression fracture, with direct extension into the right lower lobe pulmonary process. Overall, inflammatory changes within the right lower lobe are improved, with decreased size of right lower lobe lesion as compared to prior MRI from 12/16/2015. Given the interval improvement, changes suggest an improving infectious process. T7 compression fracture with 50% height loss and 4 mm bony retropulsion is stable. 2. 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 3. Overall improvement in previously seen spinal canal stenosis, although there remains persistent moderate to severe stenosis at the level of T7. 4. 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 5. Layering left pleural effusion. Electronically Signed   By: Jeannine Boga M.D.   On: 12/26/2015 23:38   Ir Angiogram Visceral Selective  Result Date: 12/27/2015 CLINICAL DATA:  GI bleed. Endoscopy suggests proximal jejunal source. Known duodenal ulcer, but no active bleeding seen on recent endoscopy. EXAM: SELECTIVE VISCERAL ARTERIOGRAPHY; IR ULTRASOUND GUIDANCE VASC ACCESS RIGHT ANESTHESIA/SEDATION: Intravenous Fentanyl and Versed were administered as conscious sedation during continuous monitoring of the patient's level of consciousness and physiological / cardiorespiratory status by the radiology  RN, with a total moderate sedation time of 32 minutes. MEDICATIONS: Lidocaine 1% subcutaneous PROCEDURE: The procedure, risks (including but not limited to bleeding, infection, organ damage ), benefits, and alternatives were explained to the patient. Questions regarding the procedure were encouraged and answered. The patient understands and consents to the procedure. Right femoral region prepped and draped in usual sterile fashion. Maximal barrier sterile technique was utilized including caps, mask, sterile gowns, sterile gloves, sterile drape, hand hygiene and skin antiseptic. The right common femoral artery was localized under ultrasound. Under real-time ultrasound guidance, the vessel was accessed with a 21-gauge micropuncture needle, exchanged over a 018 guidewire for a transitional dilator, through which a 035 guidewire was advanced. Over this, a 5 [place] vascular sheath was placed, through which a 5 [place] C2 catheter was advanced and used to selectively catheterize the celiac axis for selective arteriography in multiple projections. The C2 was then utilized to selectively catheterize the superior mesenteric artery for selective arteriography in multiple projections. The C2 was then exchanged for a RIM catheter, utilized to selectively catheterize the inferior mesenteric artery for selective arteriography in 2 projections. The catheter and sheath were removed and hemostasis achieved with the aid of the Exoseal device after confirmatory femoral arteriography. The patient tolerated the procedure well. COMPLICATIONS: None immediate FINDINGS: No evidence  of active extravasation, early draining vein, AVM, or other lesion to suggest a site or etiology of the patient's GI bleeding. 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 Venous phase confirms patency of the IMV and portal venous system. IMPRESSION: 1. Negative three-vessel mesenteric arteriogram. No evidence of active  extravasation or other focal lesion to suggest etiology of GI bleed. Electronically Signed   By: Lucrezia Europe M.D.   On: 12/27/2015 16:56   Ir Angiogram Visceral Selective  Result Date: 12/27/2015 CLINICAL DATA:  GI bleed. Endoscopy suggests proximal jejunal source. Known duodenal ulcer, but no active bleeding seen on recent endoscopy. EXAM: SELECTIVE VISCERAL ARTERIOGRAPHY; IR ULTRASOUND GUIDANCE VASC ACCESS RIGHT ANESTHESIA/SEDATION: Intravenous Fentanyl and Versed were administered as conscious sedation during continuous monitoring of the patient's level of consciousness and physiological / cardiorespiratory status by the radiology RN, with a total moderate sedation time of 32 minutes. MEDICATIONS: Lidocaine 1% subcutaneous PROCEDURE: The procedure, risks (including but not limited to bleeding, infection, organ damage ), benefits, and alternatives were explained to the patient. Questions regarding the procedure were encouraged and answered. The patient understands and consents to the procedure. Right femoral region prepped and draped in usual sterile fashion. Maximal barrier sterile technique was utilized including caps, mask, sterile gowns, sterile gloves, sterile drape, hand hygiene and skin antiseptic. The right common femoral artery was localized under ultrasound. Under real-time ultrasound guidance, the vessel was accessed with a 21-gauge micropuncture needle, exchanged over a 018 guidewire for a transitional dilator, through which a 035 guidewire was advanced. Over this, a 5 [place] vascular sheath was placed, through which a 5 [place] C2 catheter was advanced and used to selectively catheterize the celiac axis for selective arteriography in multiple projections. The C2 was then utilized to selectively catheterize the superior mesenteric artery for selective arteriography in multiple projections. The C2 was then exchanged for a RIM catheter, utilized to selectively catheterize the inferior mesenteric  artery for selective arteriography in 2 projections. The catheter and sheath were removed and hemostasis achieved with the aid of the Exoseal device after confirmatory femoral arteriography. The patient tolerated the procedure well. COMPLICATIONS: None immediate FINDINGS: No evidence of active extravasation, early draining vein, AVM, or other lesion to suggest a site or etiology of the patient's GI bleeding. 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 Venous phase confirms patency of the IMV and portal venous system. IMPRESSION: 1. Negative three-vessel mesenteric arteriogram. No evidence of active extravasation or other focal lesion to suggest etiology of GI bleed. Electronically Signed   By: Lucrezia Europe M.D.   On: 12/27/2015 16:56   Ir Angiogram Visceral Selective  Result Date: 12/27/2015 CLINICAL DATA:  GI bleed. Endoscopy suggests proximal jejunal source. Known duodenal ulcer, but no active bleeding seen on recent endoscopy. EXAM: SELECTIVE VISCERAL ARTERIOGRAPHY; IR ULTRASOUND GUIDANCE VASC ACCESS RIGHT ANESTHESIA/SEDATION: Intravenous Fentanyl and Versed were administered as conscious sedation during continuous monitoring of the patient's level of consciousness and physiological / cardiorespiratory status by the radiology RN, with a total moderate sedation time of 32 minutes. MEDICATIONS: Lidocaine 1% subcutaneous PROCEDURE: The procedure, risks (including but not limited to bleeding, infection, organ damage ), benefits, and alternatives were explained to the patient. Questions regarding the procedure were encouraged and answered. The patient understands and consents to the procedure. Right femoral region prepped and draped in usual sterile fashion. Maximal barrier sterile technique was utilized including caps, mask, sterile gowns, sterile gloves, sterile drape,  hand hygiene and skin antiseptic. The right common femoral artery was localized under ultrasound. Under  real-time ultrasound guidance, the vessel was accessed with a 21-gauge micropuncture needle, exchanged over a 018 guidewire for a transitional dilator, through which a 035 guidewire was advanced. Over this, a 5 [place] vascular sheath was placed, through which a 5 [place] C2 catheter was advanced and used to selectively catheterize the celiac axis for selective arteriography in multiple projections. The C2 was then utilized to selectively catheterize the superior mesenteric artery for selective arteriography in multiple projections. The C2 was then exchanged for a RIM catheter, utilized to selectively catheterize the inferior mesenteric artery for selective arteriography in 2 projections. The catheter and sheath were removed and hemostasis achieved with the aid of the Exoseal device after confirmatory femoral arteriography. The patient tolerated the procedure well. COMPLICATIONS: None immediate FINDINGS: No evidence of active extravasation, early draining vein, AVM, or other lesion to suggest a site or etiology of the patient's GI bleeding. 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 Venous phase confirms patency of the IMV and portal venous system. IMPRESSION: 1. Negative three-vessel mesenteric arteriogram. No evidence of active extravasation or other focal lesion to suggest etiology of GI bleed. Electronically Signed   By: Lucrezia Europe M.D.   On: 12/27/2015 16:56   Ir US Guide Vasc Access Right  Result Date: 12/27/2015 CLINICAL DATA:  GI bleed. Endoscopy suggests proximal jejunal source. Known duodenal ulcer, but no active bleeding seen on recent endoscopy. EXAM: SELECTIVE VISCERAL ARTERIOGRAPHY; IR ULTRASOUND GUIDANCE VASC ACCESS RIGHT ANESTHESIA/SEDATION: Intravenous Fentanyl and Versed were administered as conscious sedation during continuous monitoring of the patient's level of consciousness and physiological / cardiorespiratory status by the radiology RN, with a total  moderate sedation time of 32 minutes. MEDICATIONS: Lidocaine 1% subcutaneous PROCEDURE: The procedure, risks (including but not limited to bleeding, infection, organ damage ), benefits, and alternatives were explained to the patient. Questions regarding the procedure were encouraged and answered. The patient understands and consents to the procedure. Right femoral region prepped and draped in usual sterile fashion. Maximal barrier sterile technique was utilized including caps, mask, sterile gowns, sterile gloves, sterile drape, hand hygiene and skin antiseptic. The right common femoral artery was localized under ultrasound. Under real-time ultrasound guidance, the vessel was accessed with a 21-gauge micropuncture needle, exchanged over a 018 guidewire for a transitional dilator, through which a 035 guidewire was advanced. Over this, a 5 [place] vascular sheath was placed, through which a 5 [place] C2 catheter was advanced and used to selectively catheterize the celiac axis for selective arteriography in multiple projections. The C2 was then utilized to selectively catheterize the superior mesenteric artery for selective arteriography in multiple projections. The C2 was then exchanged for a RIM catheter, utilized to selectively catheterize the inferior mesenteric artery for selective arteriography in 2 projections. The catheter and sheath were removed and hemostasis achieved with the aid of the Exoseal device after confirmatory femoral arteriography. The patient tolerated the procedure well. COMPLICATIONS: None immediate FINDINGS: No evidence of active extravasation, early draining vein, AVM, or other lesion to suggest a site or etiology of the patient's GI bleeding. 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 Venous phase confirms patency of the IMV and portal venous system. IMPRESSION: 1. Negative three-vessel mesenteric arteriogram. No evidence of active extravasation or other  focal lesion to suggest etiology of GI bleed. Electronically Signed   By: Lucrezia Europe  M.D.   On: 12/27/2015 16:56   Dg Chest Port 1 View  Result Date: 12/27/2015 CLINICAL DATA:  Shortness of breath.  Post EGD for GI bleed. EXAM: PORTABLE CHEST 1 VIEW COMPARISON:  12/17/2015 and CT chest 12/14/2015 FINDINGS: Interval placement of right-sided PICC line with tip over the SVC. Lungs are adequately inflated with moderate interval improvement in the airspace process over the medial superior segment right lower lobe. No effusion. Cardiomediastinal silhouette is within normal. There is minimal calcified plaque over the aortic arch. Remainder of the exam is unchanged. IMPRESSION: Interval improvement in airspace process over the medial aspect superior segment right lower lobe likely resolving pneumonia. Right-sided PICC line with tip over the SVC. Aortic atherosclerosis. Electronically Signed   By: Marin Olp M.D.   On: 12/27/2015 14:02   Scheduled Meds: . calcitonin (salmon)  1 spray Alternating Nares Daily  . dexamethasone  10 mg Intravenous Q12H  . diltiazem  180 mg Oral Daily  . feeding supplement  1 Container Oral TID BM  . insulin aspart  0-5 Units Subcutaneous QHS  . insulin aspart  0-9 Units Subcutaneous TID WC  . ipratropium-albuterol  3 mL Nebulization TID  . LORazepam  0.5 mg Oral Daily  . nicotine polacrilex  2 mg Oral Q2H  . [START ON 12/29/2015] pantoprazole  40 mg Intravenous Q12H  . sodium chloride flush  3 mL Intravenous Q12H  . timolol  1 drop Both Eyes BID   Continuous Infusions: . lactated ringers 10 mL/hr at 12/27/15 1026  . pantoprozole (PROTONIX) infusion 8 mg/hr (12/28/15 0529)   PRN Meds:.acetaminophen **OR** acetaminophen, feeding supplement (ENSURE ENLIVE), fentaNYL (SUBLIMAZE) injection, LORazepam, methocarbamol (ROBAXIN)  IV, ondansetron (ZOFRAN) IV, polyethylene glycol, promethazine **OR** promethazine **OR** promethazine, senna-docusate, sodium chloride flush,  traZODone  ASSESMENT:   *  Upper GI bleed in patient who is receiving both Eliquis, for new onset A. fib, and Plavix for history of TIA, cerebrovascular disease. 10/27 EGD: duodenal bulb ulcer with adherent clot.  Non-bleeding duodenal ulcers as well.  Blood throughout entire examined duodenum; source of this seemed to be in proximal duodenal sweep.   Unable to locate exact bleeding lesion, the area was injected with epinephrine but bleeding did not cease.   10/27 angiogram: negative for active bleeding or source of bleeding.  On PPI drip day 2-3. 72 hours of drip finishes 10/29 at 0600.     *  ABL anemia.  *  S/p PRBC x 3.  Last on 10/27 at 11 AM.   *  Paraplegia, due to T 7 compression fracture.  Cause of fracture felt to be infectious , malignancy not entirely ruled out.  Weaning off Decadron.   *  DVT bil LE 10/25.  Eliquis, Plavix on hold due to GI bleed.   *  Right lung mass.  *  A fib.  Eliquis, Plavix on hold   PLAN   *  ? Run PPI drip beyond the 72 hours vs convert to BID IV Protonix then?    *  CBC in AM. Hgb/crit this evening.     Azucena Freed  12/28/2015, 9:44 AM Pager: [phone]  I have discussed the case with the PA, and that is the plan I formulated. I personally interviewed and examined the patient.  Appears that his upper GI bleeding is either significantly slowed or perhaps stopped altogether, with a hemoglobin of over 9 after transfusion yesterday. I see that IR was unable to localize a source of bleeding. The patient  has minimal RUQ tenderness today, the source of which is unclear. He has had some vague burning upper abdominal pain for the last month or so according to his wife during the initial consult. Also, the injected submucosal epinephrine yesterday can potentially cause this.  Nursing reports no further melena overnight or this morning.  He has a large cratered anterior wall duodenal bulb ulcer with stigmata of recent bleeding. However, the  bleeding source yesterday appeared to be on the opposite wall of that, along the posterior lateral wall of the duodenal sweep. The fresh blood could be seen and would not clear with repeated lavage, but the actual lesion itself could not be visualized due to its location. I suspect there is either another ulcer there are perhaps an AVM.  It is now been about 72 hours since his last dose of oral anticoagulation. If the bleeding consistently seems to have stopped, and I think he cannot be on [facility] for now, not until these bleeding sources have had time to heal. He will stay on 72 hours of a Protonix drip, and then tomorrow change to twice daily oral dosing. For the present time, we will need to except the risk of that being on anticoagulated with A. fib, however an IVC filter should be considered because of his recent DVT.  A serum H. pylori antibody was ordered for tomorrow morning. I discussed this plan when I saw the surgical PA earlier today, and I will try to update the patient's wife later today or by tomorrow, as she was not yet at his bedside today.    Nelida Meuse III Pager [phone]  Mon-Fri 8a-5p [phone] after 5p, weekends, holidays

## 2015-12-28 NOTE — Progress Notes (Signed)
Pt SPO2 dropped in the mid 80's overnight awhile asleep. Applied [place] at 2 LPM to maintain SAT above 92.  Pt had another large black stool this AM.

## 2015-12-28 NOTE — Progress Notes (Signed)
PROGRESS NOTE    Marcus [name]  [id number] DOB: March 04, 1940 DOA: 12/25/2015 PCP: Jeanmarie Hubert, MD  Brief Narrative:  Marcus [name] is a 75 y.o. 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 Patient's current complaints include intermittent pulling or burning sensation of his epigastric and right upper and left upper quadrants. Sometimes describes the pain as a rope pulling him tight sensation. Worse with certain movements and deep breathing. Sometimes comes on as a crampy sensation that happens spontaneously. Pain typically lasts a few seconds. Per review of patient's recent care on the third floor and discussion of staff there are no reported dark or loose bloody stools prior to being transferred to CIR. Staff on CIR note dark to black stools for the last 24 hours. Denies any other new symptoms such as fevers, cough, shortness of breath, rash, palpitations, headache, neck stiffness. Symptoms appear to be getting worse. EGD done today and showed one non-bleeding ulcer with a no nonbleeding visible vessel as well as 2 other ulcers in the duodenal sweep and second portion but bleeding was coming from an area that was not visualized with the Endoscope. IR was consulted for Angiogram for possible embolization and General Surgery was also consulted as backup. Per nurse patient had another large black stool this AM but Hb/Hct improved after transfusion yesterday and is trending down again.   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 Currently 7.6. Normocytic. CIR staff report dark stools for 24 hrs. 3W staff from previous department report nml stools. Likely secondary to upper GI bleed from initiation of Eliquis.  -Cannot r/o ulceration given recent acute stress and high dose steroids. Will attempt to wean Steroids. Decadron 10 mg IV q6h now 10 mg IV q12h.  -2 units PRBC per CIR team and 1 unit per GI team yesterday -Hold Plavix and Eliquis  -Hb/Hct improved to 9.3/27.6 and then dropped to 8.2/24.4 -Repeat CBC in AM  GI Bleed:  - Likely upper GI bleed. GI following.  - EGD Done yesterday and showed  non-bleeding ulcer with a no nonbleeding visible vessel as well as 2 other ulcers in the duodenal sweep and second portion but bleeding was coming from an area that was not visualized with the Endoscope. - IR was consulted for Angiogram for possible embolization however Negative three-vessel mesenteric arteriogram. No evidence of active extravasation or other focal lesion to suggest etiology of GI bleed.  -General Surgery was also consulted as backup incase Embolization could not be done.  - Stop Plavix and Eliquis - IV Protonix gtt for 72 hours total (D/C'd tomorrow) and then will switch to Protonix 40 mg BID -GI Dr. Loletha Carrow feels like submucosal epinephrine temporarily slowed or stopped the bleeding, or perhaps the bleeding is below the level of detection on angiogram because it is mucosal bleeding. - If significant GI ulceration may need to  consider DC Decadron; In the process of currently Weaning -Continue to Monitor for Bleeding and if continues may require Surgical Intervention however no acute surgical intervention recommended at this time.  -GI Recommends Holding Oral Anticoagulant for now.   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 The patient underwent CT-guided core biopsy of right lower lobe/posterior mediastinal mass lesion 12/18/15. No evidence of malignancy of final pathology report. Likely Inflammation and Fibrosis -Blood cultures done on admission positive for MSSA, with surveillance blood cultures negative so far. Antibiotics switched to vancomycin 12/22/15 after sputum cultures grew MRSA. PICC line Placed 12/21/15.  -TEE done 12/20/15, negative for endocarditis.  -CIR admitted pt on 10/24 but then DC on 10/25 due to GI bleed - see above.   - 2weeks of vancomycin and a switch of his antibiotics back to Cefazolin to complete a 6 week course of antibiotics - Per ID recs.  - Vanc 10/21 --> -Continue Decadron for myelopathy from compression per Neursurgical recs. - Weaning 10 mg as patient has concern for GI Bleed - Repeat MRI showed Similar appearance of abnormal signal intensity involving the T6 and T7 vertebral bodies with associated T7 compression fracture, with direct extension into the right lower lobe pulmonary process.Overall, inflammatory changes within the right lower lobe areimproved, with decreased size of right lower lobe lesion as compared to prior MRI from 12/16/2015. Given the interval improvement, changes suggest an improving infectious process. T7 compression fracture with 50% height loss and 4 mm bony retropulsion is stable. 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 Overall improvement in previously seen spinal canal stenosis,a lthough there remains persistent moderate to severe stenosis at the level of T7. 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 Layering left pleural effusion. -Pain control with IV Fentanyl and started  Norco 5/325 mg q6hprn for Pain -PT/OT recommend transfer back to CIR once stable.   Leukocytosis -WBC went from 9.3 -> 12.2 -Likely reactive from stress to yesterday's EGD and IR procedure -Repeat CBC in AM  Urinary retention: -Foley in place. Likely from T7 compression fracture and infection - Continue foley. - outpt f/u w/ Urology - UA  Afib RVR/CAD:  -Currently rate controlled.  -Cards, Dr. Tamala Julian, consulted on day of re-admit due to significant cardiac history on ASA and plavix w/ GI bleed.  - F/u Cards recs regarding anticoagulation - current recommending restarting Eliquis as monotherapy after GI bleed resolution.  - Continue with Telemtry - Continue Diltiazem 180 mg daily - GI recommends Against Anticoagulation with NOAC for now, not until the bleeding sources have had time to heal  Bilat LE DVT:  -LE duplex on 10/25 noted DVT in the right soleal veins and the left peroneal veins. Likely from prolonged hospitalization and being bedbound - Hold anticoagulation at this time due to GI bleed - no SCD - Discussed with IR Dr. Anselm Pancoast and he recommended waiting on full GI evaluation prior to IVC Filter placement.  - GI states IVC filter should be considered given his recent DVT  Pain: secondary to T7 compression fracture, infection, GI bleed, and prolonged hospital stay being bed bound. - Fentanyl PRN - Started Norco 5/325 mg - Intranasal calcitonin - Tylenol  Anxiety/Insomnia: - continue Ativan - Trazodone QHS prn  Severe Protein calorie malnutrition:  -  Albuimin1.7 and total protein 4.3.  - Poor appetite and oral intake since admission.  - Poor prognostic indicator for ability to heal - Nutrition consult - Ensure TID between meals once taking PO - Pre-albumin  H/o TIA/CVA:  - No residual deficits. S/p carotid endarterectomy - DC plavix as above  Pressure Ulcer to the Sacrum - Evaluated by wound care nurse. Foam dressings ordered.  - Pressure reduction  mattress ordered by admitting team. - Air overlay mattress  HLD:  - continue Statin  DVT prophylaxis: None because of GI Bleed Risk and no SCD's because of DVT Code Status: Full Family Communication: Discussed plan of care with wife at beside Disposition Plan: Inpatient Rehab  Consultants:   GI  Cardiology  Interventional Radiology  Infectious Diseases  General Surgery  Procedures: EGD today.   Antimicrobials: Vanc and Cefazolin after completion of Vanc  Subjective: Patient was seen and examined today however initially he told me to "stay Away" because he was afraid of more tests. When asked about him bleeding he denied it and said he was "just fine." Denied any complaints to me this AM but per overnight nurse had another large Black bowel movement.   Objective: Vitals:   12/28/15 0922 12/28/15 1133 12/28/15 1325 12/28/15 1602  BP:  117/69  (!) 108/59  Pulse:  (!) 58  (!) 57  Resp:  (!) 22  17  Temp:  97.6 F (36.4 C)  98.7 F (37.1 C)  TempSrc:  Oral  Axillary  SpO2: 97% 95% 100% 100%  Weight:        Intake/Output Summary (Last 24 hours) at 12/28/15 1851 Last data filed at 12/28/15 1800  Gross per 24 hour  Intake             1740 ml  Output             2150 ml  Net             -410 ml   Filed Weights   12/27/15 0528 12/28/15 0453  Weight: 94.1 kg (207 lb 8 oz) 94.7 kg (208 lb 12.8 oz)    Examination: Physical Exam:  Constitutional: Chronic Ill appearing 75 year old male who appears extremly uncompfortable and in pain Eyes: Lids and conjunctivae normal, sclerae anicteric  ENMT: External Ears, Nose appear normal. Grossly normal hearing.  Neck: Appears normal, supple, no cervical masses, normal ROM, no appreciable thyromegaly, no JVD Respiratory: Diminished bilaterally with wheezing and rhonchi. No appreciable crackles. Increased respiratory effort with mild tachypenia. No accessory muscle use.  Cardiovascular: RRR, no murmurs / rubs / gallops. S1 and S2  auscultated. 1+ Lower extremity edema. Abdomen: Soft, mildly tender to palpation, non-distended. No masses palpated. No appreciable hepatosplenomegaly. Bowel sounds positive.  GU: Deferred. Musculoskeletal: No clubbing / cyanosis of digits/nails. No joint deformity upper and lower extremities. No contractures. Decreased strength and muscle tone in LE still with 0/5 in Strength.   Skin: No rashes, lesions, ulcers. No induration; Warm and dry.  Neurologic: CN 2-12 grossly intact with no focal deficits. Sensation decreased especially from Right Leg to Right Nipple line and Left Leg up until Thigh, Romberg sign cerebellar reflexes not assessed.  Psychiatric: Normal judgment and insight. Alert and oriented x 3. Depressed mood and flat affect.   Data Reviewed: I have personally reviewed following labs and imaging studies  CBC:  Recent Labs Lab 12/25/15 0511 12/25/15 0700  12/26/15 0400 12/26/15 1730 12/27/15 0515 12/28/15 0828 12/28/15 1742  WBC 8.4 6.8  --  9.3  --  9.3 12.2*  --   NEUTROABS 8.1*  --   --   --   --  8.9*  --   --   HGB 7.7* 7.4*  < > 8.7* 8.8* 7.6* 9.3* 8.2*  HCT 23.8* 22.7*  < > 25.1* 26.2* 22.4* 27.6* 24.4*  MCV 93.7 93.4  --  89.3  --  91.1 93.6  --   PLT 302 278  --  206  --  175 145*  --   < > = values in this interval not displayed. Basic Metabolic Panel:  Recent Labs Lab 12/23/15 0738 12/25/15 0511 12/26/15 0400 12/27/15 0515 12/28/15 0510  NA  --  140 138 141 141  K  --  4.8 4.5 4.6 4.5  CL  --  108 110 112* 113*  CO2  --  25 24 23 23   GLUCOSE  --  132* 139* 131* 132*  BUN  --  55* 63* 62* 49*  CREATININE 0.64 0.75 0.84 0.90 0.81  CALCIUM  --  7.2* 7.2* 7.5* 7.5*  MG  --   --   --  2.6* 2.6*  PHOS  --   --   --  3.6 3.6   GFR: Estimated Creatinine Clearance: 90.4 mL/min (by C-G formula based on SCr of 0.81 mg/dL). Liver Function Tests:  Recent Labs Lab 12/25/15 0511 12/26/15 0400 12/27/15 0515 12/28/15 0510  AST 29 32 27 28  ALT 20 22 26  27   ALKPHOS 67 60 134* 65  BILITOT 0.6 1.0 0.7 0.8  PROT 4.3* 3.8* 3.8* 4.0*  ALBUMIN 1.7* 1.8* 1.9* 1.9*   No results for input(s): LIPASE, AMYLASE in the last 168 hours. No results for input(s): AMMONIA in the last 168 hours. Coagulation Profile: No results for input(s): INR, PROTIME in the last 168 hours. Cardiac Enzymes: No results for input(s): CKTOTAL, CKMB, CKMBINDEX, TROPONINI in the last 168 hours. BNP (last 3 results) No results for input(s): PROBNP in the last 8760 hours. HbA1C: No results for input(s): HGBA1C in the last 72 hours. CBG:  Recent Labs Lab 12/27/15 1707 12/27/15 2138 12/28/15 0733 12/28/15 1132 12/28/15 1600  GLUCAP 100* 133* 124* 163* 198*   Lipid Profile: No results for input(s): CHOL, HDL, LDLCALC, TRIG, CHOLHDL, LDLDIRECT in the last 72 hours. Thyroid Function Tests: No results for input(s): TSH, T4TOTAL, FREET4, T3FREE, THYROIDAB in the last 72 hours. Anemia Panel: No results for input(s): VITAMINB12, FOLATE, FERRITIN, TIBC, IRON, RETICCTPCT in the last 72 hours. Sepsis Labs: No results for input(s): PROCALCITON, LATICACIDVEN in the last 168 hours.  No results found for this or any previous visit (from the past 240 hour(s)).  Radiology Studies: Mr Thoracic Spine Wo Contrast  Result Date: 12/26/2015 CLINICAL DATA:  Re- evaluation of known compression fracture, infection, abscess. EXAM: MRI THORACIC SPINE WITHOUT CONTRAST TECHNIQUE: Multiplanar, multisequence MR imaging of the thoracic spine was performed. No intravenous contrast was administered. COMPARISON:  The prior MRI from 12/16/2015. FINDINGS: Examination is somewhat limited as the patient was unable to tolerate the full length of the exam. No contrast was administered on this study. A Alignment: Alignment is stable with preservation of the normal thoracic kyphosis. No listhesis. Vertebrae: Again seen is abnormal T1 hypo intense signal intensity involving the T6 and T7 vertebral bodies with  associated T2/STIR hyperintensity, compatible with edema. Overall, changes are relatively similar as compared to prior study. Again, changes are contiguous with the right posterior chest wall lesion. The chest wall lesion appears  overall decreased in size with decreased inflammatory changes as compared to prior MRI. This lesion measures approximately 3.2 x 2.6 cm on today's study, previously 4.4 x 4.1 cm. While no contrast was administered on this exam, the previously noted dorsal enhancing collection extending from T5 through T8-9 also appears decreased in size from previous exam, most evident on sagittal T2 weighted sequence (series 5, image 9). This collection measures up to 5 mm in maximal thickness at the level of T7-8. Associated spinal canal stenosis with mass effect on the thecal sac is improved, although remains moderate to severe at the T7 level (series 10, image 27). Thecal sac measures 8 mm in AP diameter at this level. Patchy T2 signal abnormality within the cord at T7 and T8 again seen, worrisome for myelopathy. Compression deformity involving the T7 vertebral body is relatively stable with associated approximately 50% height loss and 4 mm bony retropulsion. No other new finding within the thoracic spine. Degenerative disc bulge noted at T12-L1 without stenosis. Layering left pleural effusion noted. IMPRESSION: 1. Similar appearance of abnormal signal intensity involving the T6 and T7 vertebral bodies with associated T7 compression fracture, with direct extension into the right lower lobe pulmonary process. Overall, inflammatory changes within the right lower lobe are improved, with decreased size of right lower lobe lesion as compared to prior MRI from 12/16/2015. Given the interval improvement, changes suggest an improving infectious process. T7 compression fracture with 50% height loss and 4 mm bony retropulsion is stable. 2. Persistent but improved and decreased size of dorsal epidural collection,  likely reflecting improved epidural abscess. 3. Overall improvement in previously seen spinal canal stenosis, although there remains persistent moderate to severe stenosis at the level of T7. 4. 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 5. Layering left pleural effusion. Electronically Signed   By: Jeannine Boga M.D.   On: 12/26/2015 23:38   Ir Angiogram Visceral Selective  Result Date: 12/27/2015 CLINICAL DATA:  GI bleed. Endoscopy suggests proximal jejunal source. Known duodenal ulcer, but no active bleeding seen on recent endoscopy. EXAM: SELECTIVE VISCERAL ARTERIOGRAPHY; IR ULTRASOUND GUIDANCE VASC ACCESS RIGHT ANESTHESIA/SEDATION: Intravenous Fentanyl and Versed were administered as conscious sedation during continuous monitoring of the patient's level of consciousness and physiological / cardiorespiratory status by the radiology RN, with a total moderate sedation time of 32 minutes. MEDICATIONS: Lidocaine 1% subcutaneous PROCEDURE: The procedure, risks (including but not limited to bleeding, infection, organ damage ), benefits, and alternatives were explained to the patient. Questions regarding the procedure were encouraged and answered. The patient understands and consents to the procedure. Right femoral region prepped and draped in usual sterile fashion. Maximal barrier sterile technique was utilized including caps, mask, sterile gowns, sterile gloves, sterile drape, hand hygiene and skin antiseptic. The right common femoral artery was localized under ultrasound. Under real-time ultrasound guidance, the vessel was accessed with a 21-gauge micropuncture needle, exchanged over a 018 guidewire for a transitional dilator, through which a 035 guidewire was advanced. Over this, a 5 [place] vascular sheath was placed, through which a 5 [place] C2 catheter was advanced and used to selectively catheterize the celiac axis for  selective arteriography in multiple projections. The C2 was then utilized to selectively catheterize the superior mesenteric artery for selective arteriography in multiple projections. The C2 was then exchanged for a RIM catheter, utilized to selectively catheterize the inferior mesenteric artery for selective arteriography in 2 projections. The catheter and sheath were removed and hemostasis  achieved with the aid of the Exoseal device after confirmatory femoral arteriography. The patient tolerated the procedure well. COMPLICATIONS: None immediate FINDINGS: No evidence of active extravasation, early draining vein, AVM, or other lesion to suggest a site or etiology of the patient's GI bleeding. 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 Venous phase confirms patency of the IMV and portal venous system. IMPRESSION: 1. Negative three-vessel mesenteric arteriogram. No evidence of active extravasation or other focal lesion to suggest etiology of GI bleed. Electronically Signed   By: Lucrezia Europe M.D.   On: 12/27/2015 16:56   Ir Angiogram Visceral Selective  Result Date: 12/27/2015 CLINICAL DATA:  GI bleed. Endoscopy suggests proximal jejunal source. Known duodenal ulcer, but no active bleeding seen on recent endoscopy. EXAM: SELECTIVE VISCERAL ARTERIOGRAPHY; IR ULTRASOUND GUIDANCE VASC ACCESS RIGHT ANESTHESIA/SEDATION: Intravenous Fentanyl and Versed were administered as conscious sedation during continuous monitoring of the patient's level of consciousness and physiological / cardiorespiratory status by the radiology RN, with a total moderate sedation time of 32 minutes. MEDICATIONS: Lidocaine 1% subcutaneous PROCEDURE: The procedure, risks (including but not limited to bleeding, infection, organ damage ), benefits, and alternatives were explained to the patient. Questions regarding the procedure were encouraged and answered. The patient understands and consents to the  procedure. Right femoral region prepped and draped in usual sterile fashion. Maximal barrier sterile technique was utilized including caps, mask, sterile gowns, sterile gloves, sterile drape, hand hygiene and skin antiseptic. The right common femoral artery was localized under ultrasound. Under real-time ultrasound guidance, the vessel was accessed with a 21-gauge micropuncture needle, exchanged over a 018 guidewire for a transitional dilator, through which a 035 guidewire was advanced. Over this, a 5 [place] vascular sheath was placed, through which a 5 [place] C2 catheter was advanced and used to selectively catheterize the celiac axis for selective arteriography in multiple projections. The C2 was then utilized to selectively catheterize the superior mesenteric artery for selective arteriography in multiple projections. The C2 was then exchanged for a RIM catheter, utilized to selectively catheterize the inferior mesenteric artery for selective arteriography in 2 projections. The catheter and sheath were removed and hemostasis achieved with the aid of the Exoseal device after confirmatory femoral arteriography. The patient tolerated the procedure well. COMPLICATIONS: None immediate FINDINGS: No evidence of active extravasation, early draining vein, AVM, or other lesion to suggest a site or etiology of the patient's GI bleeding. 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 Venous phase confirms patency of the IMV and portal venous system. IMPRESSION: 1. Negative three-vessel mesenteric arteriogram. No evidence of active extravasation or other focal lesion to suggest etiology of GI bleed. Electronically Signed   By: Lucrezia Europe M.D.   On: 12/27/2015 16:56   Ir Angiogram Visceral Selective  Result Date: 12/27/2015 CLINICAL DATA:  GI bleed. Endoscopy suggests proximal jejunal source. Known duodenal ulcer, but no active bleeding seen on recent endoscopy. EXAM: SELECTIVE  VISCERAL ARTERIOGRAPHY; IR ULTRASOUND GUIDANCE VASC ACCESS RIGHT ANESTHESIA/SEDATION: Intravenous Fentanyl and Versed were administered as conscious sedation during continuous monitoring of the patient's level of consciousness and physiological / cardiorespiratory status by the radiology RN, with a total moderate sedation time of 32 minutes. MEDICATIONS: Lidocaine 1% subcutaneous PROCEDURE: The procedure, risks (including but not limited to bleeding, infection, organ damage ), benefits, and alternatives were explained to the patient. Questions regarding the procedure were encouraged and answered. The patient understands and consents to the procedure. Right  femoral region prepped and draped in usual sterile fashion. Maximal barrier sterile technique was utilized including caps, mask, sterile gowns, sterile gloves, sterile drape, hand hygiene and skin antiseptic. The right common femoral artery was localized under ultrasound. Under real-time ultrasound guidance, the vessel was accessed with a 21-gauge micropuncture needle, exchanged over a 018 guidewire for a transitional dilator, through which a 035 guidewire was advanced. Over this, a 5 [place] vascular sheath was placed, through which a 5 [place] C2 catheter was advanced and used to selectively catheterize the celiac axis for selective arteriography in multiple projections. The C2 was then utilized to selectively catheterize the superior mesenteric artery for selective arteriography in multiple projections. The C2 was then exchanged for a RIM catheter, utilized to selectively catheterize the inferior mesenteric artery for selective arteriography in 2 projections. The catheter and sheath were removed and hemostasis achieved with the aid of the Exoseal device after confirmatory femoral arteriography. The patient tolerated the procedure well. COMPLICATIONS: None immediate FINDINGS: No evidence of active extravasation, early draining vein, AVM, or other lesion to suggest  a site or etiology of the patient's GI bleeding. 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 Venous phase confirms patency of the IMV and portal venous system. IMPRESSION: 1. Negative three-vessel mesenteric arteriogram. No evidence of active extravasation or other focal lesion to suggest etiology of GI bleed. Electronically Signed   By: Lucrezia Europe M.D.   On: 12/27/2015 16:56   Ir US Guide Vasc Access Right  Result Date: 12/27/2015 CLINICAL DATA:  GI bleed. Endoscopy suggests proximal jejunal source. Known duodenal ulcer, but no active bleeding seen on recent endoscopy. EXAM: SELECTIVE VISCERAL ARTERIOGRAPHY; IR ULTRASOUND GUIDANCE VASC ACCESS RIGHT ANESTHESIA/SEDATION: Intravenous Fentanyl and Versed were administered as conscious sedation during continuous monitoring of the patient's level of consciousness and physiological / cardiorespiratory status by the radiology RN, with a total moderate sedation time of 32 minutes. MEDICATIONS: Lidocaine 1% subcutaneous PROCEDURE: The procedure, risks (including but not limited to bleeding, infection, organ damage ), benefits, and alternatives were explained to the patient. Questions regarding the procedure were encouraged and answered. The patient understands and consents to the procedure. Right femoral region prepped and draped in usual sterile fashion. Maximal barrier sterile technique was utilized including caps, mask, sterile gowns, sterile gloves, sterile drape, hand hygiene and skin antiseptic. The right common femoral artery was localized under ultrasound. Under real-time ultrasound guidance, the vessel was accessed with a 21-gauge micropuncture needle, exchanged over a 018 guidewire for a transitional dilator, through which a 035 guidewire was advanced. Over this, a 5 [place] vascular sheath was placed, through which a 5 [place] C2 catheter was advanced and used to selectively catheterize the celiac axis for selective  arteriography in multiple projections. The C2 was then utilized to selectively catheterize the superior mesenteric artery for selective arteriography in multiple projections. The C2 was then exchanged for a RIM catheter, utilized to selectively catheterize the inferior mesenteric artery for selective arteriography in 2 projections. The catheter and sheath were removed and hemostasis achieved with the aid of the Exoseal device after confirmatory femoral arteriography. The patient tolerated the procedure well. COMPLICATIONS: None immediate FINDINGS: No evidence of active extravasation, early draining vein, AVM, or other lesion to suggest a site or etiology of the patient's GI bleeding. 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 Venous phase confirms patency of the IMV and portal venous system. IMPRESSION: 1. Negative three-vessel mesenteric arteriogram.  No evidence of active extravasation or other focal lesion to suggest etiology of GI bleed. Electronically Signed   By: Lucrezia Europe M.D.   On: 12/27/2015 16:56   Dg Chest Port 1 View  Result Date: 12/27/2015 CLINICAL DATA:  Shortness of breath.  Post EGD for GI bleed. EXAM: PORTABLE CHEST 1 VIEW COMPARISON:  12/17/2015 and CT chest 12/14/2015 FINDINGS: Interval placement of right-sided PICC line with tip over the SVC. Lungs are adequately inflated with moderate interval improvement in the airspace process over the medial superior segment right lower lobe. No effusion. Cardiomediastinal silhouette is within normal. There is minimal calcified plaque over the aortic arch. Remainder of the exam is unchanged. IMPRESSION: Interval improvement in airspace process over the medial aspect superior segment right lower lobe likely resolving pneumonia. Right-sided PICC line with tip over the SVC. Aortic atherosclerosis. Electronically Signed   By: Marin Olp M.D.   On: 12/27/2015 14:02    Scheduled Meds: . calcitonin (salmon)  1  spray Alternating Nares Daily  . dexamethasone  10 mg Intravenous Q12H  . diltiazem  180 mg Oral Daily  . feeding supplement  1 Container Oral TID BM  . insulin aspart  0-5 Units Subcutaneous QHS  . insulin aspart  0-9 Units Subcutaneous TID WC  . ipratropium-albuterol  3 mL Nebulization TID  . LORazepam  0.5 mg Oral Daily  . nicotine polacrilex  2 mg Oral Q2H  . [START ON 12/29/2015] pantoprazole  40 mg Intravenous Q12H  . sodium chloride flush  3 mL Intravenous Q12H  . timolol  1 drop Both Eyes BID   Continuous Infusions: . lactated ringers 10 mL/hr at 12/27/15 1026  . pantoprozole (PROTONIX) infusion 8 mg/hr (12/28/15 1543)    LOS: 3 days   Kerney Elbe, DO [facility] Pager [phone]  If 7PM-7AM, please contact night-coverage [email or web address] Password [facility] 12/28/2015, 6:51 PM

## 2015-12-28 NOTE — Progress Notes (Signed)
[facility] Progress Note  1 Day Post-Op  Subjective: O2 sats dropped to 80's overnight, currently sats are 100% ORA. Per nurse, patient had dark stool last night/early this morning. Today patient c/o 10/10 pain over upper abdomen, worse in epigastric region. Denies fever, nausea, vomiting, hematemesis, CP, SOB  Objective: Vital signs in last 24 hours: Temp:  [97.4 F (36.3 C)-97.9 F (36.6 C)] 97.7 F (36.5 C) (10/28 0733) Pulse Rate:  [55-75] 66 (10/28 0733) Resp:  [15-27] 20 (10/28 0733) BP: (103-153)/(66-104) 116/82 (10/28 0736) SpO2:  [96 %-100 %] 96 % (10/28 0733) Weight:  [208 lb 12.8 oz (94.7 kg)] 208 lb 12.8 oz (94.7 kg) (10/28 0453) Last BM Date: 12/27/15  Intake/Output from previous day: 10/27 0701 - 10/28 0700 In: 1045 [I.V.:750; Blood:295] Out: [id number] [Urine:1750; Blood:30] Intake/Output this shift: No intake/output data recorded.  PE: Gen:  Alert, NAD, pleasant and cooperative Card:  RRR, no M/G/R heard Pulm:  CTA, decreased breath sounds in bilateral bases Abd: Soft, TTP epigastrium, ND, +BS, no guarding or peritonitis  Ext:  BL LE edema  Lab Results:   Recent Labs  12/26/15 0400 12/26/15 1730 12/27/15 0515  WBC 9.3  --  9.3  HGB 8.7* 8.8* 7.6*  HCT 25.1* 26.2* 22.4*  PLT 206  --  175   BMET  Recent Labs  12/27/15 0515 12/28/15 0510  NA 141 141  K 4.6 4.5  CL 112* 113*  CO2 23 23  GLUCOSE 131* 132*  BUN 62* 49*  CREATININE 0.90 0.81  CALCIUM 7.5* 7.5*   PT/INR No results for input(s): LABPROT, INR in the last 72 hours. CMP     Component Value Date/Time   NA 141 12/28/2015 0510   NA 139 06/21/2015 0829   K 4.5 12/28/2015 0510   CL 113 (H) 12/28/2015 0510   CO2 23 12/28/2015 0510   GLUCOSE 132 (H) 12/28/2015 0510   BUN 49 (H) 12/28/2015 0510   BUN 20 06/21/2015 0829   CREATININE 0.81 12/28/2015 0510   CREATININE 1.22 (H) 09/16/2015 0840   CALCIUM 7.5 (L) 12/28/2015 0510   PROT 4.0 (L) 12/28/2015 0510   PROT 6.6  06/21/2015 0829   ALBUMIN 1.9 (L) 12/28/2015 0510   ALBUMIN 3.9 06/21/2015 0829   AST 28 12/28/2015 0510   ALT 27 12/28/2015 0510   ALKPHOS 65 12/28/2015 0510   BILITOT 0.8 12/28/2015 0510   BILITOT 0.3 06/21/2015 0829   GFRNONAA >60 12/28/2015 0510   GFRNONAA 59 (L) 09/16/2015 0840   GFRAA >60 12/28/2015 0510   GFRAA 68 09/16/2015 0840   Lipase     Component Value Date/Time   LIPASE 19 12/01/2015 1820       Studies/Results: Mr Thoracic Spine Wo Contrast  Result Date: 12/26/2015 CLINICAL DATA:  Re- evaluation of known compression fracture, infection, abscess. EXAM: MRI THORACIC SPINE WITHOUT CONTRAST TECHNIQUE: Multiplanar, multisequence MR imaging of the thoracic spine was performed. No intravenous contrast was administered. COMPARISON:  The prior MRI from 12/16/2015. FINDINGS: Examination is somewhat limited as the patient was unable to tolerate the full length of the exam. No contrast was administered on this study. A Alignment: Alignment is stable with preservation of the normal thoracic kyphosis. No listhesis. Vertebrae: Again seen is abnormal T1 hypo intense signal intensity involving the T6 and T7 vertebral bodies with associated T2/STIR hyperintensity, compatible with edema. Overall, changes are relatively similar as compared to prior study. Again, changes are contiguous with the right posterior chest wall lesion. The chest  wall lesion appears overall decreased in size with decreased inflammatory changes as compared to prior MRI. This lesion measures approximately 3.2 x 2.6 cm on today's study, previously 4.4 x 4.1 cm. While no contrast was administered on this exam, the previously noted dorsal enhancing collection extending from T5 through T8-9 also appears decreased in size from previous exam, most evident on sagittal T2 weighted sequence (series 5, image 9). This collection measures up to 5 mm in maximal thickness at the level of T7-8. Associated spinal canal stenosis with mass  effect on the thecal sac is improved, although remains moderate to severe at the T7 level (series 10, image 27). Thecal sac measures 8 mm in AP diameter at this level. Patchy T2 signal abnormality within the cord at T7 and T8 again seen, worrisome for myelopathy. Compression deformity involving the T7 vertebral body is relatively stable with associated approximately 50% height loss and 4 mm bony retropulsion. No other new finding within the thoracic spine. Degenerative disc bulge noted at T12-L1 without stenosis. Layering left pleural effusion noted. IMPRESSION: 1. Similar appearance of abnormal signal intensity involving the T6 and T7 vertebral bodies with associated T7 compression fracture, with direct extension into the right lower lobe pulmonary process. Overall, inflammatory changes within the right lower lobe are improved, with decreased size of right lower lobe lesion as compared to prior MRI from 12/16/2015. Given the interval improvement, changes suggest an improving infectious process. T7 compression fracture with 50% height loss and 4 mm bony retropulsion is stable. 2. 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 3. Overall improvement in previously seen spinal canal stenosis, although there remains persistent moderate to severe stenosis at the level of T7. 4. 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 5. Layering left pleural effusion. Electronically Signed   By: Jeannine Boga M.D.   On: 12/26/2015 23:38   Ir Angiogram Visceral Selective  Result Date: 12/27/2015 CLINICAL DATA:  GI bleed. Endoscopy suggests proximal jejunal source. Known duodenal ulcer, but no active bleeding seen on recent endoscopy. EXAM: SELECTIVE VISCERAL ARTERIOGRAPHY; IR ULTRASOUND GUIDANCE VASC ACCESS RIGHT ANESTHESIA/SEDATION: Intravenous Fentanyl and Versed were  administered as conscious sedation during continuous monitoring of the patient's level of consciousness and physiological / cardiorespiratory status by the radiology RN, with a total moderate sedation time of 32 minutes. MEDICATIONS: Lidocaine 1% subcutaneous PROCEDURE: The procedure, risks (including but not limited to bleeding, infection, organ damage ), benefits, and alternatives were explained to the patient. Questions regarding the procedure were encouraged and answered. The patient understands and consents to the procedure. Right femoral region prepped and draped in usual sterile fashion. Maximal barrier sterile technique was utilized including caps, mask, sterile gowns, sterile gloves, sterile drape, hand hygiene and skin antiseptic. The right common femoral artery was localized under ultrasound. Under real-time ultrasound guidance, the vessel was accessed with a 21-gauge micropuncture needle, exchanged over a 018 guidewire for a transitional dilator, through which a 035 guidewire was advanced. Over this, a 5 [place] vascular sheath was placed, through which a 5 [place] C2 catheter was advanced and used to selectively catheterize the celiac axis for selective arteriography in multiple projections. The C2 was then utilized to selectively catheterize the superior mesenteric artery for selective arteriography in multiple projections. The C2 was then exchanged for a RIM catheter, utilized to selectively catheterize the inferior mesenteric artery for selective arteriography in 2 projections. The catheter and sheath were  removed and hemostasis achieved with the aid of the Exoseal device after confirmatory femoral arteriography. The patient tolerated the procedure well. COMPLICATIONS: None immediate FINDINGS: No evidence of active extravasation, early draining vein, AVM, or other lesion to suggest a site or etiology of the patient's GI bleeding. Accessory right hepatic arterial supply from the SMA is noted, an  anatomic variant. Venous phase confirms patency of the IMV and portal venous system. IMPRESSION: 1. Negative three-vessel mesenteric arteriogram. No evidence of active extravasation or other focal lesion to suggest etiology of GI bleed. Electronically Signed   By: Lucrezia Europe M.D.   On: 12/27/2015 16:56   Ir Angiogram Visceral Selective  Result Date: 12/27/2015 CLINICAL DATA:  GI bleed. Endoscopy suggests proximal jejunal source. Known duodenal ulcer, but no active bleeding seen on recent endoscopy. EXAM: SELECTIVE VISCERAL ARTERIOGRAPHY; IR ULTRASOUND GUIDANCE VASC ACCESS RIGHT ANESTHESIA/SEDATION: Intravenous Fentanyl and Versed were administered as conscious sedation during continuous monitoring of the patient's level of consciousness and physiological / cardiorespiratory status by the radiology RN, with a total moderate sedation time of 32 minutes. MEDICATIONS: Lidocaine 1% subcutaneous PROCEDURE: The procedure, risks (including but not limited to bleeding, infection, organ damage ), benefits, and alternatives were explained to the patient. Questions regarding the procedure were encouraged and answered. The patient understands and consents to the procedure. Right femoral region prepped and draped in usual sterile fashion. Maximal barrier sterile technique was utilized including caps, mask, sterile gowns, sterile gloves, sterile drape, hand hygiene and skin antiseptic. The right common femoral artery was localized under ultrasound. Under real-time ultrasound guidance, the vessel was accessed with a 21-gauge micropuncture needle, exchanged over a 018 guidewire for a transitional dilator, through which a 035 guidewire was advanced. Over this, a 5 [place] vascular sheath was placed, through which a 5 [place] C2 catheter was advanced and used to selectively catheterize the celiac axis for selective arteriography in multiple projections. The C2 was then utilized to selectively catheterize the superior mesenteric  artery for selective arteriography in multiple projections. The C2 was then exchanged for a RIM catheter, utilized to selectively catheterize the inferior mesenteric artery for selective arteriography in 2 projections. The catheter and sheath were removed and hemostasis achieved with the aid of the Exoseal device after confirmatory femoral arteriography. The patient tolerated the procedure well. COMPLICATIONS: None immediate FINDINGS: No evidence of active extravasation, early draining vein, AVM, or other lesion to suggest a site or etiology of the patient's GI bleeding. 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 Venous phase confirms patency of the IMV and portal venous system. IMPRESSION: 1. Negative three-vessel mesenteric arteriogram. No evidence of active extravasation or other focal lesion to suggest etiology of GI bleed. Electronically Signed   By: Lucrezia Europe M.D.   On: 12/27/2015 16:56   Ir Angiogram Visceral Selective  Result Date: 12/27/2015 CLINICAL DATA:  GI bleed. Endoscopy suggests proximal jejunal source. Known duodenal ulcer, but no active bleeding seen on recent endoscopy. EXAM: SELECTIVE VISCERAL ARTERIOGRAPHY; IR ULTRASOUND GUIDANCE VASC ACCESS RIGHT ANESTHESIA/SEDATION: Intravenous Fentanyl and Versed were administered as conscious sedation during continuous monitoring of the patient's level of consciousness and physiological / cardiorespiratory status by the radiology RN, with a total moderate sedation time of 32 minutes. MEDICATIONS: Lidocaine 1% subcutaneous PROCEDURE: The procedure, risks (including but not limited to bleeding, infection, organ damage ), benefits, and alternatives were explained to the patient. Questions regarding the procedure were encouraged and answered. The patient understands and consents to  the procedure. Right femoral region prepped and draped in usual sterile fashion. Maximal barrier sterile technique was utilized  including caps, mask, sterile gowns, sterile gloves, sterile drape, hand hygiene and skin antiseptic. The right common femoral artery was localized under ultrasound. Under real-time ultrasound guidance, the vessel was accessed with a 21-gauge micropuncture needle, exchanged over a 018 guidewire for a transitional dilator, through which a 035 guidewire was advanced. Over this, a 5 [place] vascular sheath was placed, through which a 5 [place] C2 catheter was advanced and used to selectively catheterize the celiac axis for selective arteriography in multiple projections. The C2 was then utilized to selectively catheterize the superior mesenteric artery for selective arteriography in multiple projections. The C2 was then exchanged for a RIM catheter, utilized to selectively catheterize the inferior mesenteric artery for selective arteriography in 2 projections. The catheter and sheath were removed and hemostasis achieved with the aid of the Exoseal device after confirmatory femoral arteriography. The patient tolerated the procedure well. COMPLICATIONS: None immediate FINDINGS: No evidence of active extravasation, early draining vein, AVM, or other lesion to suggest a site or etiology of the patient's GI bleeding. 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 Venous phase confirms patency of the IMV and portal venous system. IMPRESSION: 1. Negative three-vessel mesenteric arteriogram. No evidence of active extravasation or other focal lesion to suggest etiology of GI bleed. Electronically Signed   By: Lucrezia Europe M.D.   On: 12/27/2015 16:56   Ir US Guide Vasc Access Right  Result Date: 12/27/2015 CLINICAL DATA:  GI bleed. Endoscopy suggests proximal jejunal source. Known duodenal ulcer, but no active bleeding seen on recent endoscopy. EXAM: SELECTIVE VISCERAL ARTERIOGRAPHY; IR ULTRASOUND GUIDANCE VASC ACCESS RIGHT ANESTHESIA/SEDATION: Intravenous Fentanyl and Versed were  administered as conscious sedation during continuous monitoring of the patient's level of consciousness and physiological / cardiorespiratory status by the radiology RN, with a total moderate sedation time of 32 minutes. MEDICATIONS: Lidocaine 1% subcutaneous PROCEDURE: The procedure, risks (including but not limited to bleeding, infection, organ damage ), benefits, and alternatives were explained to the patient. Questions regarding the procedure were encouraged and answered. The patient understands and consents to the procedure. Right femoral region prepped and draped in usual sterile fashion. Maximal barrier sterile technique was utilized including caps, mask, sterile gowns, sterile gloves, sterile drape, hand hygiene and skin antiseptic. The right common femoral artery was localized under ultrasound. Under real-time ultrasound guidance, the vessel was accessed with a 21-gauge micropuncture needle, exchanged over a 018 guidewire for a transitional dilator, through which a 035 guidewire was advanced. Over this, a 5 [place] vascular sheath was placed, through which a 5 [place] C2 catheter was advanced and used to selectively catheterize the celiac axis for selective arteriography in multiple projections. The C2 was then utilized to selectively catheterize the superior mesenteric artery for selective arteriography in multiple projections. The C2 was then exchanged for a RIM catheter, utilized to selectively catheterize the inferior mesenteric artery for selective arteriography in 2 projections. The catheter and sheath were removed and hemostasis achieved with the aid of the Exoseal device after confirmatory femoral arteriography. The patient tolerated the procedure well. COMPLICATIONS: None immediate FINDINGS: No evidence of active extravasation, early draining vein, AVM, or other lesion to suggest a site or etiology of the patient's GI bleeding. Accessory right hepatic arterial supply from the SMA is noted, an  anatomic variant. Venous phase confirms patency of the IMV and portal venous system. IMPRESSION: 1.  Negative three-vessel mesenteric arteriogram. No evidence of active extravasation or other focal lesion to suggest etiology of GI bleed. Electronically Signed   By: Lucrezia Europe M.D.   On: 12/27/2015 16:56   Dg Chest Port 1 View  Result Date: 12/27/2015 CLINICAL DATA:  Shortness of breath.  Post EGD for GI bleed. EXAM: PORTABLE CHEST 1 VIEW COMPARISON:  12/17/2015 and CT chest 12/14/2015 FINDINGS: Interval placement of right-sided PICC line with tip over the SVC. Lungs are adequately inflated with moderate interval improvement in the airspace process over the medial superior segment right lower lobe. No effusion. Cardiomediastinal silhouette is within normal. There is minimal calcified plaque over the aortic arch. Remainder of the exam is unchanged. IMPRESSION: Interval improvement in airspace process over the medial aspect superior segment right lower lobe likely resolving pneumonia. Right-sided PICC line with tip over the SVC. Aortic atherosclerosis. Electronically Signed   By: Marin Olp M.D.   On: 12/27/2015 14:02   Anti-infectives: Anti-infectives    Start     Dose/Rate Route Frequency Ordered Stop   12/25/15 1700  vancomycin (VANCOCIN) 1,250 mg in sodium chloride 0.9 % 250 mL IVPB  Status:  Discontinued     1,250 mg 166.7 mL/hr over 90 Minutes Intravenous Every 12 hours 12/25/15 1654 12/25/15 1931     Assessment/Plan Upper GI bleed secondary to duodenal ulcers Melena Acute blood loss anemia - 10/25 2 units PRBC, Plavix and Eloquis held, PPI drip started - 10/27 1 unit PRBC, Upper Endoscopy, Dr. Loletha Carrow; one, deep non-bleeding ulcer of anterior duodenal bulb, blood in duodenum with source unclear - not able to treat.  - 10/27 IR performed angiogram - no bleeding seen.  - hqb 7.6, hct 22.4 yesterday - repeat CBC pending  FEN: currently on heart healthy/carb modified diet GI proph: protonix  drip VTE: anticoag held due to GIB, SCD's held due to BL LE DVT   Plan: follow CBC's and stool. No active bleeding as of yesterday evening. No acute surgical intervention recommended at this time.   LOS: 3 days    [facility] 12/28/2015, 7:54 AM Pager: [phone] Consults: [phone] Mon-Fri 7:00 am-4:30 pm Sat-Sun 7:00 am-11:30 am

## 2015-12-28 NOTE — Anesthesia Postprocedure Evaluation (Signed)
Anesthesia Post Note  Patient: Marcus [name]  Procedure(s) Performed: Procedure(s) (LRB): ESOPHAGOGASTRODUODENOSCOPY (EGD) (N/A)  Patient location during evaluation: Endoscopy Anesthesia Type: MAC Level of consciousness: awake, awake and alert and oriented Pain management: pain level controlled Vital Signs Assessment: post-procedure vital signs reviewed and stable Respiratory status: spontaneous breathing, nonlabored ventilation and respiratory function stable Cardiovascular status: blood pressure returned to baseline Anesthetic complications: no    Last Vitals:  Vitals:   12/28/15 0736 12/28/15 0841  BP: 116/82 116/82  Pulse:    Resp:    Temp:      Last Pain:  Vitals:   12/28/15 0904  TempSrc:   PainSc: 5                  Kaprice Kage COKER

## 2015-12-28 NOTE — Progress Notes (Signed)
Referring Physician(s): Danis,H  Supervising Physician: Marybelle Killings  Patient Status:  [facility] - In-pt  Chief Complaint:  UGI bleed, abdominal pain  Subjective: Pt with cont intermittent abd/epigastric pain; no evidence of further sig bleeding ; denies n/v  Allergies: Codeine; Demerol [meperidine]; Contrast media [iodinated diagnostic agents]; Gabapentin; and Other  Medications: Prior to Admission medications   Medication Sig Start Date End Date Taking? Authorizing Provider  apixaban (ELIQUIS) 5 MG TABS tablet Take 1 tablet (5 mg total) by mouth 2 (two) times daily. 12/24/15   Venetia Maxon Rama, MD  atorvastatin (LIPITOR) 40 MG tablet TAKE ONE TABLET BY MOUTH ONCE DAILY FOR CHOLESTEROL Patient taking differently: TAKE ONE TABLET BY MOUTH ONCE DAILY AT BEDTIME FOR CHOLESTEROL 11/20/15   Estill Dooms, MD  Cholecalciferol (VITAMIN D3) 5000 units TABS Take 5,000 Units by mouth at bedtime.    Historical Provider, MD  clopidogrel (PLAVIX) 75 MG tablet TAKE 1 TABLET BY MOUTH DAILY 07/04/15   Rosetta Posner, MD  dexamethasone (DECADRON) 10 MG/ML injection Inject 1 mL (10 mg total) into the vein every 6 (six) hours. 12/24/15   Venetia Maxon Rama, MD  diltiazem (CARDIZEM CD) 180 MG 24 hr capsule Take 1 capsule (180 mg total) by mouth daily. 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 12/24/15   Venetia Maxon Rama, MD  HYDROcodone-acetaminophen (NORCO/VICODIN) 5-325 MG tablet Take 0.5-1 tablets by mouth every 6 (six) hours as needed for moderate pain.     Historical Provider, MD  LORazepam (ATIVAN) 0.5 MG tablet Take 0.5 mg  by mouth See admin instructions. Take 1 tablet (0.5 mg) by mouth every morning, may also take 1 tablet 2 more times during the day as needed for anxiety 11/21/15   Historical Provider, MD  nicotine polacrilex (COMMIT) 2 MG lozenge Take 2 mg by mouth every 2 (two) hours.     Historical Provider, MD  ondansetron (ZOFRAN) 4 MG tablet Take 1 tablet (4 mg total) by mouth every 8 (eight) hours as needed for nausea or vomiting. 12/02/15   Lauree Chandler, NP  polyethylene glycol (MIRALAX / GLYCOLAX) packet Take 17 g by mouth daily. 12/25/15   Christina P Rama, MD  senna-docusate (SENOKOT-S) 8.6-50 MG tablet Take 1 tablet by mouth 2 (two) times daily. 12/24/15   Christina P Rama, MD  timolol (BETIMOL) 0.5 % ophthalmic solution Place 1 drop into both eyes 2 (two) times daily.    Historical Provider, MD  vancomycin 1,250 mg in sodium chloride 0.9 % 250 mL Inject 1,250 mg into the vein every 12 (twelve) hours. 12/24/15   Venetia Maxon Rama, MD     Vital Signs: BP 116/82   Pulse 66   Temp 97.7 F (36.5 C) (Axillary)   Resp (!) 22   Wt 208 lb 12.8 oz (94.7 kg)   SpO2 97%   BMI 32.70 kg/m   Physical Exam abd soft,mild- mod epigastric/RUQ discomfort; puncture site rt CFA soft,mildly tender, no hematoma; intact distal pulses  Imaging: Mr Thoracic Spine Wo Contrast  Result Date: 12/26/2015 CLINICAL DATA:  Re- evaluation of known compression  fracture, infection, abscess. EXAM: MRI THORACIC SPINE WITHOUT CONTRAST TECHNIQUE: Multiplanar, multisequence MR imaging of the thoracic spine was performed. No intravenous contrast was administered. COMPARISON:  The prior MRI from 12/16/2015. FINDINGS: Examination is somewhat limited as the patient was unable to tolerate the full length of the exam. No contrast was administered on this study. A Alignment: Alignment is stable with preservation of the normal thoracic kyphosis. No listhesis. Vertebrae: Again seen is abnormal T1 hypo intense signal intensity involving the T6  and T7 vertebral bodies with associated T2/STIR hyperintensity, compatible with edema. Overall, changes are relatively similar as compared to prior study. Again, changes are contiguous with the right posterior chest wall lesion. The chest wall lesion appears overall decreased in size with decreased inflammatory changes as compared to prior MRI. This lesion measures approximately 3.2 x 2.6 cm on today's study, previously 4.4 x 4.1 cm. While no contrast was administered on this exam, the previously noted dorsal enhancing collection extending from T5 through T8-9 also appears decreased in size from previous exam, most evident on sagittal T2 weighted sequence (series 5, image 9). This collection measures up to 5 mm in maximal thickness at the level of T7-8. Associated spinal canal stenosis with mass effect on the thecal sac is improved, although remains moderate to severe at the T7 level (series 10, image 27). Thecal sac measures 8 mm in AP diameter at this level. Patchy T2 signal abnormality within the cord at T7 and T8 again seen, worrisome for myelopathy. Compression deformity involving the T7 vertebral body is relatively stable with associated approximately 50% height loss and 4 mm bony retropulsion. No other new finding within the thoracic spine. Degenerative disc bulge noted at T12-L1 without stenosis. Layering left pleural effusion noted. IMPRESSION: 1. Similar appearance of abnormal signal intensity involving the T6 and T7 vertebral bodies with associated T7 compression fracture, with direct extension into the right lower lobe pulmonary process. Overall, inflammatory changes within the right lower lobe are improved, with decreased size of right lower lobe lesion as compared to prior MRI from 12/16/2015. Given the interval improvement, changes suggest an improving infectious process. T7 compression fracture with 50% height loss and 4 mm bony retropulsion is stable. 2. 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 3. Overall improvement in previously seen spinal canal stenosis, although there remains persistent moderate to severe stenosis at the level of T7. 4. 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 5. Layering left pleural effusion. Electronically Signed   By: Jeannine Boga M.D.   On: 12/26/2015 23:38   Ir Angiogram Visceral Selective  Result Date: 12/27/2015 CLINICAL DATA:  GI bleed. Endoscopy suggests proximal jejunal source. Known duodenal ulcer, but no active bleeding seen on recent endoscopy. EXAM: SELECTIVE VISCERAL ARTERIOGRAPHY; IR ULTRASOUND GUIDANCE VASC ACCESS RIGHT ANESTHESIA/SEDATION: Intravenous Fentanyl and Versed were administered as conscious sedation during continuous monitoring of the patient's level of consciousness and physiological / cardiorespiratory status by the radiology RN, with a total moderate sedation time of 32 minutes. MEDICATIONS: Lidocaine 1% subcutaneous PROCEDURE: The procedure, risks (including but not limited to bleeding, infection, organ damage ), benefits, and alternatives were explained to the patient. Questions regarding the procedure were encouraged and answered. The patient understands and consents to the procedure. Right femoral region prepped and draped in usual sterile fashion. Maximal barrier sterile technique was utilized including caps, mask, sterile gowns, sterile gloves, sterile drape, hand hygiene and  skin antiseptic. The right common femoral artery was localized under ultrasound. Under real-time ultrasound guidance, the vessel was accessed with a 21-gauge micropuncture needle, exchanged over a 018 guidewire for a transitional dilator, through which a 035 guidewire was advanced. Over this, a 5 [place] vascular sheath was placed, through which a 5 [place] C2 catheter was advanced and used to selectively catheterize  the celiac axis for selective arteriography in multiple projections. The C2 was then utilized to selectively catheterize the superior mesenteric artery for selective arteriography in multiple projections. The C2 was then exchanged for a RIM catheter, utilized to selectively catheterize the inferior mesenteric artery for selective arteriography in 2 projections. The catheter and sheath were removed and hemostasis achieved with the aid of the Exoseal device after confirmatory femoral arteriography. The patient tolerated the procedure well. COMPLICATIONS: None immediate FINDINGS: No evidence of active extravasation, early draining vein, AVM, or other lesion to suggest a site or etiology of the patient's GI bleeding. 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 Venous phase confirms patency of the IMV and portal venous system. IMPRESSION: 1. Negative three-vessel mesenteric arteriogram. No evidence of active extravasation or other focal lesion to suggest etiology of GI bleed. Electronically Signed   By: Lucrezia Europe M.D.   On: 12/27/2015 16:56   Ir Angiogram Visceral Selective  Result Date: 12/27/2015 CLINICAL DATA:  GI bleed. Endoscopy suggests proximal jejunal source. Known duodenal ulcer, but no active bleeding seen on recent endoscopy. EXAM: SELECTIVE VISCERAL ARTERIOGRAPHY; IR ULTRASOUND GUIDANCE VASC ACCESS RIGHT ANESTHESIA/SEDATION: Intravenous Fentanyl and Versed were administered as conscious sedation during continuous monitoring of the patient's level of consciousness and physiological / cardiorespiratory status by the radiology RN, with a total moderate sedation time of 32 minutes. MEDICATIONS: Lidocaine 1% subcutaneous PROCEDURE: The procedure, risks (including but not limited to bleeding, infection, organ damage ), benefits, and alternatives were explained to the patient. Questions regarding the procedure were encouraged and answered. The patient understands and  consents to the procedure. Right femoral region prepped and draped in usual sterile fashion. Maximal barrier sterile technique was utilized including caps, mask, sterile gowns, sterile gloves, sterile drape, hand hygiene and skin antiseptic. The right common femoral artery was localized under ultrasound. Under real-time ultrasound guidance, the vessel was accessed with a 21-gauge micropuncture needle, exchanged over a 018 guidewire for a transitional dilator, through which a 035 guidewire was advanced. Over this, a 5 [place] vascular sheath was placed, through which a 5 [place] C2 catheter was advanced and used to selectively catheterize the celiac axis for selective arteriography in multiple projections. The C2 was then utilized to selectively catheterize the superior mesenteric artery for selective arteriography in multiple projections. The C2 was then exchanged for a RIM catheter, utilized to selectively catheterize the inferior mesenteric artery for selective arteriography in 2 projections. The catheter and sheath were removed and hemostasis achieved with the aid of the Exoseal device after confirmatory femoral arteriography. The patient tolerated the procedure well. COMPLICATIONS: None immediate FINDINGS: No evidence of active extravasation, early draining vein, AVM, or other lesion to suggest a site or etiology of the patient's GI bleeding. 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 Venous phase confirms patency of the IMV and portal venous system. IMPRESSION: 1. Negative three-vessel mesenteric arteriogram. No evidence of active extravasation or other focal lesion to suggest etiology of GI bleed. Electronically Signed   By: Lucrezia Europe M.D.   On: 12/27/2015 16:56  Ir Angiogram Visceral Selective  Result Date: 12/27/2015 CLINICAL DATA:  GI bleed. Endoscopy suggests proximal jejunal source. Known duodenal ulcer, but no active bleeding seen on recent endoscopy. EXAM:  SELECTIVE VISCERAL ARTERIOGRAPHY; IR ULTRASOUND GUIDANCE VASC ACCESS RIGHT ANESTHESIA/SEDATION: Intravenous Fentanyl and Versed were administered as conscious sedation during continuous monitoring of the patient's level of consciousness and physiological / cardiorespiratory status by the radiology RN, with a total moderate sedation time of 32 minutes. MEDICATIONS: Lidocaine 1% subcutaneous PROCEDURE: The procedure, risks (including but not limited to bleeding, infection, organ damage ), benefits, and alternatives were explained to the patient. Questions regarding the procedure were encouraged and answered. The patient understands and consents to the procedure. Right femoral region prepped and draped in usual sterile fashion. Maximal barrier sterile technique was utilized including caps, mask, sterile gowns, sterile gloves, sterile drape, hand hygiene and skin antiseptic. The right common femoral artery was localized under ultrasound. Under real-time ultrasound guidance, the vessel was accessed with a 21-gauge micropuncture needle, exchanged over a 018 guidewire for a transitional dilator, through which a 035 guidewire was advanced. Over this, a 5 [place] vascular sheath was placed, through which a 5 [place] C2 catheter was advanced and used to selectively catheterize the celiac axis for selective arteriography in multiple projections. The C2 was then utilized to selectively catheterize the superior mesenteric artery for selective arteriography in multiple projections. The C2 was then exchanged for a RIM catheter, utilized to selectively catheterize the inferior mesenteric artery for selective arteriography in 2 projections. The catheter and sheath were removed and hemostasis achieved with the aid of the Exoseal device after confirmatory femoral arteriography. The patient tolerated the procedure well. COMPLICATIONS: None immediate FINDINGS: No evidence of active extravasation, early draining vein, AVM, or other lesion  to suggest a site or etiology of the patient's GI bleeding. 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 Venous phase confirms patency of the IMV and portal venous system. IMPRESSION: 1. Negative three-vessel mesenteric arteriogram. No evidence of active extravasation or other focal lesion to suggest etiology of GI bleed. Electronically Signed   By: Lucrezia Europe M.D.   On: 12/27/2015 16:56   Ir US Guide Vasc Access Right  Result Date: 12/27/2015 CLINICAL DATA:  GI bleed. Endoscopy suggests proximal jejunal source. Known duodenal ulcer, but no active bleeding seen on recent endoscopy. EXAM: SELECTIVE VISCERAL ARTERIOGRAPHY; IR ULTRASOUND GUIDANCE VASC ACCESS RIGHT ANESTHESIA/SEDATION: Intravenous Fentanyl and Versed were administered as conscious sedation during continuous monitoring of the patient's level of consciousness and physiological / cardiorespiratory status by the radiology RN, with a total moderate sedation time of 32 minutes. MEDICATIONS: Lidocaine 1% subcutaneous PROCEDURE: The procedure, risks (including but not limited to bleeding, infection, organ damage ), benefits, and alternatives were explained to the patient. Questions regarding the procedure were encouraged and answered. The patient understands and consents to the procedure. Right femoral region prepped and draped in usual sterile fashion. Maximal barrier sterile technique was utilized including caps, mask, sterile gowns, sterile gloves, sterile drape, hand hygiene and skin antiseptic. The right common femoral artery was localized under ultrasound. Under real-time ultrasound guidance, the vessel was accessed with a 21-gauge micropuncture needle, exchanged over a 018 guidewire for a transitional dilator, through which a 035 guidewire was advanced. Over this, a 5 [place] vascular sheath was placed, through which a 5 [place] C2 catheter was advanced and used to selectively catheterize the celiac axis for  selective arteriography in multiple projections. The C2 was then  utilized to selectively catheterize the superior mesenteric artery for selective arteriography in multiple projections. The C2 was then exchanged for a RIM catheter, utilized to selectively catheterize the inferior mesenteric artery for selective arteriography in 2 projections. The catheter and sheath were removed and hemostasis achieved with the aid of the Exoseal device after confirmatory femoral arteriography. The patient tolerated the procedure well. COMPLICATIONS: None immediate FINDINGS: No evidence of active extravasation, early draining vein, AVM, or other lesion to suggest a site or etiology of the patient's GI bleeding. 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 Venous phase confirms patency of the IMV and portal venous system. IMPRESSION: 1. Negative three-vessel mesenteric arteriogram. No evidence of active extravasation or other focal lesion to suggest etiology of GI bleed. Electronically Signed   By: Lucrezia Europe M.D.   On: 12/27/2015 16:56   Dg Chest Port 1 View  Result Date: 12/27/2015 CLINICAL DATA:  Shortness of breath.  Post EGD for GI bleed. EXAM: PORTABLE CHEST 1 VIEW COMPARISON:  12/17/2015 and CT chest 12/14/2015 FINDINGS: Interval placement of right-sided PICC line with tip over the SVC. Lungs are adequately inflated with moderate interval improvement in the airspace process over the medial superior segment right lower lobe. No effusion. Cardiomediastinal silhouette is within normal. There is minimal calcified plaque over the aortic arch. Remainder of the exam is unchanged. IMPRESSION: Interval improvement in airspace process over the medial aspect superior segment right lower lobe likely resolving pneumonia. Right-sided PICC line with tip over the SVC. Aortic atherosclerosis. Electronically Signed   By: Marin Olp M.D.   On: 12/27/2015 14:02    Labs:  CBC:  Recent Labs   12/25/15 0700  12/26/15 0400 12/26/15 1730 12/27/15 0515 12/28/15 0828  WBC 6.8  --  9.3  --  9.3 12.2*  HGB 7.4*  < > 8.7* 8.8* 7.6* 9.3*  HCT 22.7*  < > 25.1* 26.2* 22.4* 27.6*  PLT 278  --  206  --  175 145*  < > = values in this interval not displayed.  COAGS:  Recent Labs  12/14/15 1721  INR 1.04    BMP:  Recent Labs  12/25/15 0511 12/26/15 0400 12/27/15 0515 12/28/15 0510  NA 140 138 141 141  K 4.8 4.5 4.6 4.5  CL 108 110 112* 113*  CO2 25 24 23 23   GLUCOSE 132* 139* 131* 132*  BUN 55* 63* 62* 49*  CALCIUM 7.2* 7.2* 7.5* 7.5*  CREATININE 0.75 0.84 0.90 0.81  GFRNONAA >60 >60 >60 >60  GFRAA >60 >60 >60 >60    LIVER FUNCTION TESTS:  Recent Labs  12/25/15 0511 12/26/15 0400 12/27/15 0515 12/28/15 0510  BILITOT 0.6 1.0 0.7 0.8  AST 29 32 27 28  ALT 20 22 26 27   ALKPHOS 67 60 134* 65  PROT 4.3* 3.8* 3.8* 4.0*  ALBUMIN 1.7* 1.8* 1.9* 1.9*    Assessment and Plan: UGI bleed, s/p neg mesenteric angio 10/27; plans as per GI/CCS/IM; latest HGB 9.3, creat ok   Electronically Signed: D. Rowe Robert 12/28/2015, 11:32 AM   I spent a total of 15 minutes at the the patient's bedside AND on the patient's hospital floor or unit, greater than 50% of which was counseling/coordinating care for mesenteric arteriogram    Patient ID: Marcus [name], male   DOB: 19-Aug-1940, 75 y.o.   MRN: [id number]

## 2015-12-29 ENCOUNTER — Encounter (HOSPITAL_COMMUNITY): Payer: Self-pay | Admitting: Gastroenterology

## 2015-12-29 LAB — COMPREHENSIVE METABOLIC PANEL
ALT: 23 U/L (ref 17–63)
ANION GAP: 4 — AB (ref 5–15)
AST: 27 U/L (ref 15–41)
Albumin: 1.7 g/dL — ABNORMAL LOW (ref 3.5–5.0)
Alkaline Phosphatase: 56 U/L (ref 38–126)
BILIRUBIN TOTAL: 0.8 mg/dL (ref 0.3–1.2)
BUN: 41 mg/dL — ABNORMAL HIGH (ref 6–20)
CHLORIDE: 112 mmol/L — AB (ref 101–111)
CO2: 23 mmol/L (ref 22–32)
Calcium: 7.3 mg/dL — ABNORMAL LOW (ref 8.9–10.3)
Creatinine, Ser: 0.67 mg/dL (ref 0.61–1.24)
Glucose, Bld: 127 mg/dL — ABNORMAL HIGH (ref 65–99)
POTASSIUM: 4.4 mmol/L (ref 3.5–5.1)
Sodium: 139 mmol/L (ref 135–145)
TOTAL PROTEIN: 3.8 g/dL — AB (ref 6.5–8.1)

## 2015-12-29 LAB — CBC
HCT: 22.3 % — ABNORMAL LOW (ref 39.0–52.0)
HEMOGLOBIN: 7.5 g/dL — AB (ref 13.0–17.0)
MCH: 31 pg (ref 26.0–34.0)
MCHC: 33.6 g/dL (ref 30.0–36.0)
MCV: 92.1 fL (ref 78.0–100.0)
PLATELETS: 114 10*3/uL — AB (ref 150–400)
RBC: 2.42 MIL/uL — AB (ref 4.22–5.81)
RDW: 16.8 % — ABNORMAL HIGH (ref 11.5–15.5)
WBC: 11.3 10*3/uL — ABNORMAL HIGH (ref 4.0–10.5)

## 2015-12-29 LAB — PHOSPHORUS: PHOSPHORUS: 2.8 mg/dL (ref 2.5–4.6)

## 2015-12-29 LAB — GLUCOSE, CAPILLARY
GLUCOSE-CAPILLARY: 133 mg/dL — AB (ref 65–99)
GLUCOSE-CAPILLARY: 128 mg/dL — AB (ref 65–99)
Glucose-Capillary: 121 mg/dL — ABNORMAL HIGH (ref 65–99)
Glucose-Capillary: 111 mg/dL — ABNORMAL HIGH (ref 65–99)

## 2015-12-29 LAB — PREPARE RBC (CROSSMATCH)

## 2015-12-29 LAB — PROTIME-INR
INR: 1.16
PROTHROMBIN TIME: 14.9 s (ref 11.4–15.2)

## 2015-12-29 LAB — HEMOGLOBIN AND HEMATOCRIT, BLOOD
HCT: 25.4 % — ABNORMAL LOW (ref 39.0–52.0)
Hemoglobin: 8.5 g/dL — ABNORMAL LOW (ref 13.0–17.0)

## 2015-12-29 LAB — MAGNESIUM: MAGNESIUM: 2.4 mg/dL (ref 1.7–2.4)

## 2015-12-29 MED ORDER — FUROSEMIDE 10 MG/ML IJ SOLN
40.0000 mg | Freq: Once | INTRAMUSCULAR | Status: AC
  Administered 2015-12-29: 40 mg via INTRAVENOUS
  Filled 2015-12-29: qty 4

## 2015-12-29 MED ORDER — DEXAMETHASONE SODIUM PHOSPHATE 10 MG/ML IJ SOLN: 5.0000 mg | Freq: Two times a day (BID) | INTRAMUSCULAR | Status: DC

## 2015-12-29 MED ORDER — SODIUM CHLORIDE 0.9 % IV SOLN
8.0000 mg/h | INTRAVENOUS | Status: DC
  Administered 2015-12-29 – 2015-12-30 (×3): 8 mg/h via INTRAVENOUS
  Filled 2015-12-29 (×9): qty 80

## 2015-12-29 MED ORDER — PANTOPRAZOLE SODIUM 40 MG PO TBEC
40.0000 mg | DELAYED_RELEASE_TABLET | Freq: Two times a day (BID) | ORAL | Status: DC
  Administered 2015-12-29: 40 mg via ORAL
  Filled 2015-12-29: qty 1

## 2015-12-29 MED ORDER — DEXAMETHASONE SODIUM PHOSPHATE 4 MG/ML IJ SOLN
8.0000 mg | Freq: Two times a day (BID) | INTRAMUSCULAR | Status: DC
  Administered 2015-12-29 – 2015-12-30 (×4): 8 mg via INTRAVENOUS
  Filled 2015-12-29 (×5): qty 2

## 2015-12-29 MED ORDER — KCL IN DEXTROSE-NACL 20-5-0.45 MEQ/L-%-% IV SOLN
INTRAVENOUS | Status: AC
  Administered 2015-12-29: 13:00:00 via INTRAVENOUS
  Filled 2015-12-29 (×2): qty 1000

## 2015-12-29 MED ORDER — SODIUM CHLORIDE 0.9 % IV SOLN: Freq: Once | INTRAVENOUS | Status: DC

## 2015-12-29 NOTE — Progress Notes (Signed)
[facility] Progress Note  2 Days Post-Op  Subjective: Abdominal pain not improved from yesterday, now with severe nausea that started yesterday afternoon. Denies hematemesis. + melena this AM. Patient is "exhausted".   Objective: Vital signs in last 24 hours: Temp:  [97.6 F (36.4 C)-98.7 F (37.1 C)] 97.8 F (36.6 C) (10/29 0723) Pulse Rate:  [57-66] 66 (10/29 0723) Resp:  [16-30] 24 (10/29 0723) BP: (108-127)/(59-90) 116/63 (10/29 0723) SpO2:  [95 %-100 %] 100 % (10/29 0723) Weight:  [208 lb 3.2 oz (94.4 kg)] 208 lb 3.2 oz (94.4 kg) (10/29 0712) Last BM Date: 12/28/15  Intake/Output from previous day: 10/28 0701 - 10/29 0700 In: 1434.2 [P.O.:840; I.V.:594.2] Out: 400 [Urine:400] Intake/Output this shift: No intake/output data recorded.  PE: Gen:  Alert, NAD, pleasant Card:  Irregularly irregular Abd: Soft, TTP upper abdomen without peritonitis,, +BS, no peritonitis  Heme + dark/green BM Ext:  BL edema  Lab Results:   Recent Labs  12/27/15 0515 12/28/15 0828 12/28/15 1742  WBC 9.3 12.2*  --   HGB 7.6* 9.3* 8.2*  HCT 22.4* 27.6* 24.4*  PLT 175 145*  --    BMET  Recent Labs  12/28/15 0510 12/29/15 0453  NA 141 139  K 4.5 4.4  CL 113* 112*  CO2 23 23  GLUCOSE 132* 127*  BUN 49* 41*  CREATININE 0.81 0.67  CALCIUM 7.5* 7.3*   PT/INR No results for input(s): LABPROT, INR in the last 72 hours. CMP     Component Value Date/Time   NA 139 12/29/2015 0453   NA 139 06/21/2015 0829   K 4.4 12/29/2015 0453   CL 112 (H) 12/29/2015 0453   CO2 23 12/29/2015 0453   GLUCOSE 127 (H) 12/29/2015 0453   BUN 41 (H) 12/29/2015 0453   BUN 20 06/21/2015 0829   CREATININE 0.67 12/29/2015 0453   CREATININE 1.22 (H) 09/16/2015 0840   CALCIUM 7.3 (L) 12/29/2015 0453   PROT 3.8 (L) 12/29/2015 0453   PROT 6.6 06/21/2015 0829   ALBUMIN 1.7 (L) 12/29/2015 0453   ALBUMIN 3.9 06/21/2015 0829   AST 27 12/29/2015 0453   ALT 23 12/29/2015 0453   ALKPHOS 56  12/29/2015 0453   BILITOT 0.8 12/29/2015 0453   BILITOT 0.3 06/21/2015 0829   GFRNONAA >60 12/29/2015 0453   GFRNONAA 59 (L) 09/16/2015 0840   GFRAA >60 12/29/2015 0453   GFRAA 68 09/16/2015 0840   Lipase     Component Value Date/Time   LIPASE 19 12/01/2015 1820       Studies/Results: Ir Angiogram Visceral Selective  Result Date: 12/27/2015 CLINICAL DATA:  GI bleed. Endoscopy suggests proximal jejunal source. Known duodenal ulcer, but no active bleeding seen on recent endoscopy. EXAM: SELECTIVE VISCERAL ARTERIOGRAPHY; IR ULTRASOUND GUIDANCE VASC ACCESS RIGHT ANESTHESIA/SEDATION: Intravenous Fentanyl and Versed were administered as conscious sedation during continuous monitoring of the patient's level of consciousness and physiological / cardiorespiratory status by the radiology RN, with a total moderate sedation time of 32 minutes. MEDICATIONS: Lidocaine 1% subcutaneous PROCEDURE: The procedure, risks (including but not limited to bleeding, infection, organ damage ), benefits, and alternatives were explained to the patient. Questions regarding the procedure were encouraged and answered. The patient understands and consents to the procedure. Right femoral region prepped and draped in usual sterile fashion. Maximal barrier sterile technique was utilized including caps, mask, sterile gowns, sterile gloves, sterile drape, hand hygiene and skin antiseptic. The right common femoral artery was localized under ultrasound. Under real-time ultrasound guidance, the  vessel was accessed with a 21-gauge micropuncture needle, exchanged over a 018 guidewire for a transitional dilator, through which a 035 guidewire was advanced. Over this, a 5 [place] vascular sheath was placed, through which a 5 [place] C2 catheter was advanced and used to selectively catheterize the celiac axis for selective arteriography in multiple projections. The C2 was then utilized to selectively catheterize the superior mesenteric artery  for selective arteriography in multiple projections. The C2 was then exchanged for a RIM catheter, utilized to selectively catheterize the inferior mesenteric artery for selective arteriography in 2 projections. The catheter and sheath were removed and hemostasis achieved with the aid of the Exoseal device after confirmatory femoral arteriography. The patient tolerated the procedure well. COMPLICATIONS: None immediate FINDINGS: No evidence of active extravasation, early draining vein, AVM, or other lesion to suggest a site or etiology of the patient's GI bleeding. 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 Venous phase confirms patency of the IMV and portal venous system. IMPRESSION: 1. Negative three-vessel mesenteric arteriogram. No evidence of active extravasation or other focal lesion to suggest etiology of GI bleed. Electronically Signed   By: Lucrezia Europe M.D.   On: 12/27/2015 16:56   Ir Angiogram Visceral Selective  Result Date: 12/27/2015 CLINICAL DATA:  GI bleed. Endoscopy suggests proximal jejunal source. Known duodenal ulcer, but no active bleeding seen on recent endoscopy. EXAM: SELECTIVE VISCERAL ARTERIOGRAPHY; IR ULTRASOUND GUIDANCE VASC ACCESS RIGHT ANESTHESIA/SEDATION: Intravenous Fentanyl and Versed were administered as conscious sedation during continuous monitoring of the patient's level of consciousness and physiological / cardiorespiratory status by the radiology RN, with a total moderate sedation time of 32 minutes. MEDICATIONS: Lidocaine 1% subcutaneous PROCEDURE: The procedure, risks (including but not limited to bleeding, infection, organ damage ), benefits, and alternatives were explained to the patient. Questions regarding the procedure were encouraged and answered. The patient understands and consents to the procedure. Right femoral region prepped and draped in usual sterile fashion. Maximal barrier sterile technique was utilized including caps,  mask, sterile gowns, sterile gloves, sterile drape, hand hygiene and skin antiseptic. The right common femoral artery was localized under ultrasound. Under real-time ultrasound guidance, the vessel was accessed with a 21-gauge micropuncture needle, exchanged over a 018 guidewire for a transitional dilator, through which a 035 guidewire was advanced. Over this, a 5 [place] vascular sheath was placed, through which a 5 [place] C2 catheter was advanced and used to selectively catheterize the celiac axis for selective arteriography in multiple projections. The C2 was then utilized to selectively catheterize the superior mesenteric artery for selective arteriography in multiple projections. The C2 was then exchanged for a RIM catheter, utilized to selectively catheterize the inferior mesenteric artery for selective arteriography in 2 projections. The catheter and sheath were removed and hemostasis achieved with the aid of the Exoseal device after confirmatory femoral arteriography. The patient tolerated the procedure well. COMPLICATIONS: None immediate FINDINGS: No evidence of active extravasation, early draining vein, AVM, or other lesion to suggest a site or etiology of the patient's GI bleeding. 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 Venous phase confirms patency of the IMV and portal venous system. IMPRESSION: 1. Negative three-vessel mesenteric arteriogram. No evidence of active extravasation or other focal lesion to suggest etiology of GI bleed. Electronically Signed   By: Lucrezia Europe M.D.   On: 12/27/2015 16:56   Ir Angiogram Visceral Selective  Result Date: 12/27/2015 CLINICAL DATA:  GI bleed. Endoscopy  suggests proximal jejunal source. Known duodenal ulcer, but no active bleeding seen on recent endoscopy. EXAM: SELECTIVE VISCERAL ARTERIOGRAPHY; IR ULTRASOUND GUIDANCE VASC ACCESS RIGHT ANESTHESIA/SEDATION: Intravenous Fentanyl and Versed were administered as  conscious sedation during continuous monitoring of the patient's level of consciousness and physiological / cardiorespiratory status by the radiology RN, with a total moderate sedation time of 32 minutes. MEDICATIONS: Lidocaine 1% subcutaneous PROCEDURE: The procedure, risks (including but not limited to bleeding, infection, organ damage ), benefits, and alternatives were explained to the patient. Questions regarding the procedure were encouraged and answered. The patient understands and consents to the procedure. Right femoral region prepped and draped in usual sterile fashion. Maximal barrier sterile technique was utilized including caps, mask, sterile gowns, sterile gloves, sterile drape, hand hygiene and skin antiseptic. The right common femoral artery was localized under ultrasound. Under real-time ultrasound guidance, the vessel was accessed with a 21-gauge micropuncture needle, exchanged over a 018 guidewire for a transitional dilator, through which a 035 guidewire was advanced. Over this, a 5 [place] vascular sheath was placed, through which a 5 [place] C2 catheter was advanced and used to selectively catheterize the celiac axis for selective arteriography in multiple projections. The C2 was then utilized to selectively catheterize the superior mesenteric artery for selective arteriography in multiple projections. The C2 was then exchanged for a RIM catheter, utilized to selectively catheterize the inferior mesenteric artery for selective arteriography in 2 projections. The catheter and sheath were removed and hemostasis achieved with the aid of the Exoseal device after confirmatory femoral arteriography. The patient tolerated the procedure well. COMPLICATIONS: None immediate FINDINGS: No evidence of active extravasation, early draining vein, AVM, or other lesion to suggest a site or etiology of the patient's GI bleeding. 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  Venous phase confirms patency of the IMV and portal venous system. IMPRESSION: 1. Negative three-vessel mesenteric arteriogram. No evidence of active extravasation or other focal lesion to suggest etiology of GI bleed. Electronically Signed   By: Lucrezia Europe M.D.   On: 12/27/2015 16:56   Ir US Guide Vasc Access Right  Result Date: 12/27/2015 CLINICAL DATA:  GI bleed. Endoscopy suggests proximal jejunal source. Known duodenal ulcer, but no active bleeding seen on recent endoscopy. EXAM: SELECTIVE VISCERAL ARTERIOGRAPHY; IR ULTRASOUND GUIDANCE VASC ACCESS RIGHT ANESTHESIA/SEDATION: Intravenous Fentanyl and Versed were administered as conscious sedation during continuous monitoring of the patient's level of consciousness and physiological / cardiorespiratory status by the radiology RN, with a total moderate sedation time of 32 minutes. MEDICATIONS: Lidocaine 1% subcutaneous PROCEDURE: The procedure, risks (including but not limited to bleeding, infection, organ damage ), benefits, and alternatives were explained to the patient. Questions regarding the procedure were encouraged and answered. The patient understands and consents to the procedure. Right femoral region prepped and draped in usual sterile fashion. Maximal barrier sterile technique was utilized including caps, mask, sterile gowns, sterile gloves, sterile drape, hand hygiene and skin antiseptic. The right common femoral artery was localized under ultrasound. Under real-time ultrasound guidance, the vessel was accessed with a 21-gauge micropuncture needle, exchanged over a 018 guidewire for a transitional dilator, through which a 035 guidewire was advanced. Over this, a 5 [place] vascular sheath was placed, through which a 5 [place] C2 catheter was advanced and used to selectively catheterize the celiac axis for selective arteriography in multiple projections. The C2 was then utilized to selectively catheterize the superior mesenteric artery for selective  arteriography in multiple projections.  The C2 was then exchanged for a RIM catheter, utilized to selectively catheterize the inferior mesenteric artery for selective arteriography in 2 projections. The catheter and sheath were removed and hemostasis achieved with the aid of the Exoseal device after confirmatory femoral arteriography. The patient tolerated the procedure well. COMPLICATIONS: None immediate FINDINGS: No evidence of active extravasation, early draining vein, AVM, or other lesion to suggest a site or etiology of the patient's GI bleeding. 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 Venous phase confirms patency of the IMV and portal venous system. IMPRESSION: 1. Negative three-vessel mesenteric arteriogram. No evidence of active extravasation or other focal lesion to suggest etiology of GI bleed. Electronically Signed   By: Lucrezia Europe M.D.   On: 12/27/2015 16:56   Dg Chest Port 1 View  Result Date: 12/27/2015 CLINICAL DATA:  Shortness of breath.  Post EGD for GI bleed. EXAM: PORTABLE CHEST 1 VIEW COMPARISON:  12/17/2015 and CT chest 12/14/2015 FINDINGS: Interval placement of right-sided PICC line with tip over the SVC. Lungs are adequately inflated with moderate interval improvement in the airspace process over the medial superior segment right lower lobe. No effusion. Cardiomediastinal silhouette is within normal. There is minimal calcified plaque over the aortic arch. Remainder of the exam is unchanged. IMPRESSION: Interval improvement in airspace process over the medial aspect superior segment right lower lobe likely resolving pneumonia. Right-sided PICC line with tip over the SVC. Aortic atherosclerosis. Electronically Signed   By: Marin Olp M.D.   On: 12/27/2015 14:02    Anti-infectives: Anti-infectives    Start     Dose/Rate Route Frequency Ordered Stop   12/25/15 1700  vancomycin (VANCOCIN) 1,250 mg in sodium chloride 0.9 % 250 mL IVPB   Status:  Discontinued     1,250 mg 166.7 mL/hr over 90 Minutes Intravenous Every 12 hours 12/25/15 1654 12/25/15 1931     Assessment/Plan Upper GI bleed Melena Acute blood loss anemia - 10/25 2 units PRBC, Plavix and Eloquis held, PPI drip started - 10/27 1 unit PRBC, Upper Endoscopy, Dr. Loletha Carrow; one, deep non-bleeding ulcer of anterior duodenal bulb, blood in duodenum with source unclear - not able to treat.  - 10/27 IR performed mesenteric angiogram - no bleeding seen.  - Hgb now falling again 9.3 >> 8.2 >> 7.5 over 24h. Repeat pending. - melena  FEN: currently on heart healthy/carb modified diet GI proph: protonix drip VTE: anticoag held due to GIB, SCD's held due to BL LE DVT   Plan: persistent 10/10 epigastric pain, nausea, melena, and anemia. Repeat CBC pending. Will discuss surgical plan with MD. If his bleeding persists he will need surgical interventions, as upper endoscopy and IR angiogram were both unsuccessful.    LOS: 4 days    [facility] 12/29/2015, 8:03 AM Pager: [phone] Consults: [phone] Mon-Fri 7:00 am-4:30 pm Sat-Sun 7:00 am-11:30 am

## 2015-12-29 NOTE — Progress Notes (Signed)
Daily Rounding Note  12/29/2015, 8:37 AM  LOS: 4 days   SUBJECTIVE:   Chief complaint:blood loss anemia  still having pain in RUQ, nausea this AM but no emesis.   Stool this AM is greenish black and strongly FOBT +      OBJECTIVE:         Vital signs in last 24 hours:    Temp:  [97.6 F (36.4 C)-98.7 F (37.1 C)] 97.8 F (36.6 C) (10/29 0723) Pulse Rate:  [57-66] 66 (10/29 0723) Resp:  [16-30] 24 (10/29 0723) BP: (108-127)/(59-90) 116/63 (10/29 0723) SpO2:  [95 %-100 %] 100 % (10/29 0723) Weight:  [94.4 kg (208 lb 3.2 oz)] 94.4 kg (208 lb 3.2 oz) (10/29 0712) Last BM Date: 12/28/15 Filed Weights   12/27/15 0528 12/28/15 0453 12/29/15 [id number]  Weight: 94.1 kg (207 lb 8 oz) 94.7 kg (208 lb 12.8 oz) 94.4 kg (208 lb 3.2 oz)   General: currently getting cleaned up by staff   Heart: RRR Chest: no SOB.  Tight, mucoid cough Abdomen: not examined  Extremities: swelling in arms and legs Neuro/Psych:  Alert, calm.    Intake/Output from previous day: 10/28 0701 - 10/29 0700 In: 1434.2 [P.O.:840; I.V.:594.2] Out: 400 [Urine:400]  Intake/Output this shift: No intake/output data recorded.  Lab Results:  Recent Labs  12/27/15 0515 12/28/15 0828 12/28/15 1742 12/29/15 0805  WBC 9.3 12.2*  --  11.3*  HGB 7.6* 9.3* 8.2* 7.5*  HCT 22.4* 27.6* 24.4* 22.3*  PLT 175 145*  --  PENDING   BMET  Recent Labs  12/27/15 0515 12/28/15 0510 12/29/15 0453  NA 141 141 139  K 4.6 4.5 4.4  CL 112* 113* 112*  CO2 23 23 23   GLUCOSE 131* 132* 127*  BUN 62* 49* 41*  CREATININE 0.90 0.81 0.67  CALCIUM 7.5* 7.5* 7.3*   LFT  Recent Labs  12/27/15 0515 12/28/15 0510 12/29/15 0453  PROT 3.8* 4.0* 3.8*  ALBUMIN 1.9* 1.9* 1.7*  AST 27 28 27   ALT 26 27 23   ALKPHOS 134* 65 56  BILITOT 0.7 0.8 0.8    Studies/Results:  Ir Angiogram Visceral Selective Ir US Guide Vasc Access Right  Result Date: 12/27/2015 CLINICAL  DATA:  GI bleed. Endoscopy suggests proximal jejunal source. Known duodenal ulcer, but no active bleeding seen on recent endoscopy. EXAM: SELECTIVE VISCERAL ARTERIOGRAPHY; IR ULTRASOUND GUIDANCE VASC ACCESS RIGHT ANESTHESIA/SEDATION: Intravenous Fentanyl and Versed were administered as conscious sedation during continuous monitoring of the patient's level of consciousness and physiological / cardiorespiratory status by the radiology RN, with a total moderate sedation time of 32 minutes. MEDICATIONS: Lidocaine 1% subcutaneous PROCEDURE: The procedure, risks (including but not limited to bleeding, infection, organ damage ), benefits, and alternatives were explained to the patient. Questions regarding the procedure were encouraged and answered. The patient understands and consents to the procedure. Right femoral region prepped and draped in usual sterile fashion. Maximal barrier sterile technique was utilized including caps, mask, sterile gowns, sterile gloves, sterile drape, hand hygiene and skin antiseptic. The right common femoral artery was localized under ultrasound. Under real-time ultrasound guidance, the vessel was accessed with a 21-gauge micropuncture needle, exchanged over a 018 guidewire for a transitional dilator, through which a 035 guidewire was advanced. Over this, a 5 [place] vascular sheath was placed, through which a 5 [place] C2 catheter was advanced and used to selectively catheterize the celiac axis for selective arteriography in multiple projections. The C2 was  then utilized to selectively catheterize the superior mesenteric artery for selective arteriography in multiple projections. The C2 was then exchanged for a RIM catheter, utilized to selectively catheterize the inferior mesenteric artery for selective arteriography in 2 projections. The catheter and sheath were removed and hemostasis achieved with the aid of the Exoseal device after confirmatory femoral arteriography. The patient tolerated  the procedure well. COMPLICATIONS: None immediate FINDINGS: No evidence of active extravasation, early draining vein, AVM, or other lesion to suggest a site or etiology of the patient's GI bleeding. 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 Venous phase confirms patency of the IMV and portal venous system. IMPRESSION: 1. Negative three-vessel mesenteric arteriogram. No evidence of active extravasation or other focal lesion to suggest etiology of GI bleed. Electronically Signed   By: Lucrezia Europe M.D.   On: 12/27/2015 16:56   Dg Chest Port 1 View  Result Date: 12/27/2015 CLINICAL DATA:  Shortness of breath.  Post EGD for GI bleed. EXAM: PORTABLE CHEST 1 VIEW COMPARISON:  12/17/2015 and CT chest 12/14/2015 FINDINGS: Interval placement of right-sided PICC line with tip over the SVC. Lungs are adequately inflated with moderate interval improvement in the airspace process over the medial superior segment right lower lobe. No effusion. Cardiomediastinal silhouette is within normal. There is minimal calcified plaque over the aortic arch. Remainder of the exam is unchanged. IMPRESSION: Interval improvement in airspace process over the medial aspect superior segment right lower lobe likely resolving pneumonia. Right-sided PICC line with tip over the SVC. Aortic atherosclerosis. Electronically Signed   By: Marin Olp M.D.   On: 12/27/2015 14:02   Scheduled Meds: . calcitonin (salmon)  1 spray Alternating Nares Daily  . dexamethasone  8 mg Intravenous Q12H  . diltiazem  180 mg Oral Daily  . feeding supplement  1 Container Oral TID BM  . insulin aspart  0-5 Units Subcutaneous QHS  . insulin aspart  0-9 Units Subcutaneous TID WC  . ipratropium-albuterol  3 mL Nebulization TID  . LORazepam  0.5 mg Oral Daily  . nicotine polacrilex  2 mg Oral Q2H  . pantoprazole  40 mg Oral BID  . sodium chloride flush  3 mL Intravenous Q12H  . timolol  1 drop Both Eyes BID    Continuous Infusions: . lactated ringers 10 mL/hr at 12/27/15 1026   PRN Meds:.acetaminophen **OR** acetaminophen, feeding supplement (ENSURE ENLIVE), fentaNYL (SUBLIMAZE) injection, HYDROcodone-acetaminophen, LORazepam, ondansetron (ZOFRAN) IV, polyethylene glycol, promethazine **OR** promethazine **OR** promethazine, senna-docusate, sodium chloride flush, traZODone   ASSESMENT:    * Upper GI bleed in setting Eliquis, Plavix.  10/27 EGD: duodenal bulb ulcer with adherent clot.  Non-bleeding duodenal ulcers as well.  Blood throughout entire examined duodenum; source of this seemed to be in proximal duodenal sweep across from the bulbar ulcer.   Unable to locate exact bleeding lesion (? If another ulcer vs AVM), the area was injected with epinephrine but bleeding did not cease.   10/27 angiogram: negative for active bleeding or source of bleeding.   72 hours Protonix drip finished 10/29 at 0600, now on BID PO Protonix.  Strongly FOBT + stools, RUQ abd pain persist.  Nausea.     * ABL anemia.  S/p PRBC x 3.  Last on 10/27 at 11 AM.  Hgb down close to 2 gm in last 24 hours.     * Paraplegia, due to T 7 compression fracture. Cause of fracture felt to be infectious , malignancy not  entirely ruled out.  Weaning off Decadron.   * A fib, DVT bil LE 10/25 (DVT despite Eliquis).  Eliquis, Plavix on hold due to GI bleed.   * Right lung mass.   PLAN   *  PO BID Protonix.  Hold Plavix, Eliquis.   *  Await serum H Pylori Ab results.   *  Await surgical opinion.   *  Hgb/crit, coags early this evening, CBC in AM.      Azucena Freed  12/29/2015, 8:37 AM Pager: [phone]  I have discussed the case with the PA, and that is the plan I formulated. I personally interviewed and examined the patient.  He appears to have ongoing bleeding and I believe he needs surgical intervention.  The location of the bleeding source in the duodenal sweep made it inaccessible to endoscopic control.   It was not found on angiography. Although the surgical service seems insistent on a repeat EGD, I do not suspect that it would be any more successful than the last one.  I have ordered a unit of PRBCs. I will make him NPO now to keep all options open.    Nelida Meuse III Pager [phone]  Mon-Fri 8a-5p [phone] after 5p, weekends, holidays

## 2015-12-29 NOTE — Progress Notes (Signed)
PROGRESS NOTE    Marcus [name]  [id number] DOB: Jan 12, 1941 DOA: 12/25/2015 PCP: Jeanmarie Hubert, MD  Brief Narrative:  Marcus [name] is a 75 y.o. 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 Patient's current complaints include intermittent pulling or burning sensation of his epigastric and right upper and left upper quadrants. Sometimes describes the pain as a rope pulling him tight sensation. Worse with certain movements and deep breathing. Sometimes comes on as a crampy sensation that happens spontaneously. Pain typically lasts a few seconds. Per review of patient's recent care on the third floor and discussion of staff there are no reported dark or loose bloody stools prior to being transferred to CIR. Staff on CIR note dark to black stools for the last 24 hours. Denies any other new symptoms such as fevers, cough, shortness of breath, rash, palpitations, headache, neck stiffness. Symptoms appear to be getting worse. EGD done today and showed one non-bleeding ulcer with a no nonbleeding visible vessel as well as 2 other ulcers in the duodenal sweep and second portion but bleeding was coming from an area that was not visualized with the Endoscope. IR was consulted for Angiogram for possible embolization and General Surgery was also consulted as backup. Per nurse patient had another large black stool this AM but Hb/Hct improved after transfusion yesterday and is trending down again and patient required another unit of blood today.   Assessment & Plan:   Active Problems:   Essential hypertension   Cerebrovascular accident (CVA) due to stenosis of left carotid artery (HCC)   Atrial fibrillation with RVR (HCC)   Urinary retention   Lower extremity weakness   T7 vertebral fracture (HCC)   Pressure injury of skin   MRSA infection   MSSA (methicillin susceptible Staphylococcus aureus) septicemia  (HCC)   Paraplegia (HCC)   Acute blood loss anemia   Melena   Gastrointestinal hemorrhage associated with duodenal ulcer   Myopathy   Other abnormalities of gait and mobility   Leukocytosis  Symptomatic anemia likley 2/2 to Duodenal Ulcer Bleed:  -Baseline 11. Currently 7.6. Normocytic. CIR staff report dark stools for 24 hrs. 3W staff from previous department report nml stools. Likely secondary to upper GI bleed in the Duodenem from initiation of Eliquis.  -Cannot r/o ulceration given recent acute stress and high dose steroids. Will attempt to wean Steroids. Decadron 10 mg IV q6h now 10 mg IV q12h.  -2 units PRBC per CIR team and 1 unit per GI team prior to Endoscopy -Hold Plavix and Eliquis  -Hb/Hct improved to 9.3/27.6 and then dropped to 7.5/22.3 today -GI transfused another Unit of Blood today; GI feels Surgical Intervention maybe warranted However Surgery may want repeat Endoscopy first -NPO by Gastroenterology -REPEAT H/H this EVENING -Repeat CBC in AM  GI Bleed 2/2 to Duodenal Ulcer Bleed:  - Likely upper GI bleed. GI following.  - EGD Done yesterday and showed  non-bleeding ulcer with a no nonbleeding visible vessel as well as 2 other ulcers in the duodenal sweep and second portion but bleeding was coming from an area that was not visualized with the Endoscope. - IR was consulted for Angiogram for possible embolization however Negative three-vessel mesenteric arteriogram. No evidence of active extravasation or other focal lesion to suggest etiology of GI bleed.  -General Surgery was also consulted as backup incase Embolization could not be done.  - Stop Plavix and Eliquis - IV Protonix  gtt for 72 hours total (D/C'd tomorrow) and then will switch to Protonix 40 mg BID -GI Dr. Loletha Carrow feels like submucosal epinephrine temporarily slowed or stopped the bleeding, or perhaps the bleeding is below the level of detection on angiogram because it is mucosal bleeding. - If significant GI  ulceration may need to consider DC Decadron; In the process of currently Weaning - Now down to 8 mg q12h -GI Recommends Holding Oral Anticoagulant for now.  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 The patient underwent CT-guided core biopsy of right lower lobe/posterior mediastinal mass lesion 12/18/15. No evidence of malignancy of final pathology report. Likely Inflammation and Fibrosis -Blood cultures done on admission positive for MSSA, with surveillance blood cultures negative so far. Antibiotics switched to vancomycin 12/22/15 after sputum cultures grew MRSA. PICC line Placed 12/21/15.  -TEE done 12/20/15, negative for endocarditis.  -CIR admitted pt on 10/24 but then DC on 10/25 due to GI bleed - see above.   - 2weeks of vancomycin and a switch of his antibiotics back to Cefazolin to complete a 6 week course of antibiotics - Per ID recs.  - Vanc 10/21 --> -Continue Decadron for myelopathy from compression per Neursurgical recs. - Weaning 10 mg as patient has concern for GI Bleed - Repeat MRI showed Similar appearance of abnormal signal intensity involving the T6 and T7 vertebral bodies with associated T7 compression fracture, with direct extension into the right lower lobe pulmonary process.Overall, inflammatory changes within the right lower lobe areimproved, with decreased size of right lower lobe lesion as compared to prior MRI from 12/16/2015. Given the interval improvement, changes suggest an improving infectious process. T7 compression fracture with 50% height loss and 4 mm bony retropulsion is stable. 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  Overall improvement in previously seen spinal canal stenosis,a lthough there remains persistent moderate to severe stenosis at the level of T7. 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 Layering left pleural effusion. -Pain control with IV Fentanyl and started Norco 5/325 mg q6hprn for Pain -PT/OT recommend transfer back to CIR once stable.   Leukocytosis -WBC went from 9.3 -> 12.2 -> 11.3 -Likely reactive from stress to yesterday's EGD and IR procedure -Repeat CBC in AM  Urinary retention: -Foley in place. Likely from T7 compression fracture and infection - Continue foley. - outpt f/u w/ Urology - UA  Afib RVR/CAD:  -Currently rate controlled.  -Cards, Dr. Tamala Julian, consulted on day of re-admit due to significant cardiac history on ASA and plavix w/ GI bleed.  - F/u Cards recs regarding anticoagulation - currently recommending restarting Eliquis as monotherapy after GI bleed resolution.  - Continue with Telemtry - Continue Diltiazem 180 mg daily - GI recommends Against Anticoagulation with NOAC for now, not until the bleeding sources have had time to heal  Bilat LE DVT:  -LE duplex on 10/25 noted DVT in the right soleal veins and the left peroneal veins. Likely from prolonged hospitalization and being bedbound - Hold anticoagulation at this time due to GI bleed - no SCD - Discussed with IR Dr. Anselm Pancoast and he recommended waiting on full GI evaluation prior to IVC Filter placement.  - GI  states IVC filter should be considered given his recent DVT -Will Pursue IVC Filter after GI Bleeding resolves.  Pain: secondary to T7 compression fracture, infection, GI bleed, and prolonged hospital stay being bed bound. - Fentanyl PRN - Started Norco 5/325 mg - Intranasal calcitonin - Tylenol  Anxiety/Insomnia: - continue Ativan - Trazodone QHS prn  Severe Protein calorie malnutrition:  - Albuimin1.7 and total  protein 4.3.  - Poor appetite and oral intake since admission.  - Poor prognostic indicator for ability to heal - Nutrition consult - Ensure TID between meals once taking PO - Pre-albumin  H/o TIA/CVA:  - No residual deficits. S/p carotid endarterectomy - DC plavix as above  Pressure Ulcer to the Sacrum - Evaluated by wound care nurse. Foam dressings ordered.  - Pressure reduction mattress ordered by admitting team. - Air overlay mattress  HLD:  - continue Statin  DVT prophylaxis: None because of GI Bleed Risk and no SCD's because of DVT Code Status: Full Family Communication: Discussed plan of care with wife at beside Disposition Plan: Inpatient Rehab when stable  Consultants:   GI  Cardiology  Interventional Radiology  Infectious Diseases  General Surgery  Procedures: EGD;  Antimicrobials: Vanc and Cefazolin after completion of Vanc  Subjective: Patient was seen and examined today and he was feeling weak but better. Still had bloody bowel movements but stated pain was better controlled. No CP or lightheadedness. Discussed plan of care with wife and receiving another unit of blood. Updated wife on plan of care.    Objective: Vitals:   12/29/15 1515 12/29/15 1825 12/29/15 1905 12/29/15 1954  BP: 113/90 122/83 127/64 115/65  Pulse: (!) 54 61 (!) 57 (!) 58  Resp: 19 (!) 21 17 (!) 22  Temp: 98.2 F (36.8 C) 98 F (36.7 C) 97.8 F (36.6 C) 98.1 F (36.7 C)  TempSrc:  Oral Oral Oral  SpO2: 100% 98% 100% 100%  Weight:        Intake/Output Summary (Last 24 hours) at 12/29/15 1959 Last data filed at 12/29/15 1900  Gross per 24 hour  Intake          1477.92 ml  Output             1450 ml  Net            27.92 ml   Filed Weights   12/27/15 0528 12/28/15 0453 12/29/15 [id number]  Weight: 94.1 kg (207 lb 8 oz) 94.7 kg (208 lb 12.8 oz) 94.4 kg (208 lb 3.2 oz)    Examination: Physical Exam:  Constitutional: Chronic Ill appearing 75 year old male who appears  extremly uncompfortable and in pain Eyes: Lids and conjunctivae normal, sclerae anicteric  ENMT: External Ears, Nose appear normal. Grossly normal hearing.  Neck: Appears normal, supple, no cervical masses, normal ROM, no appreciable thyromegaly, no JVD Respiratory: Diminished bilaterally with wheezing and rhonchi. No appreciable crackles. Increased respiratory effort with mild tachypenia. No accessory muscle use.  Cardiovascular: RRR, no murmurs / rubs / gallops. S1 and S2 auscultated. 2+ Lower extremity edema. Abdomen: Soft, mildly tender to palpation, non-distended. No masses palpated. No appreciable hepatosplenomegaly. Bowel sounds positive.  GU: Deferred. Musculoskeletal: No clubbing / cyanosis of digits/nails. No joint deformity upper and lower extremities. No contractures. Decreased strength and muscle tone in LE still with 0/5 in Strength.   Skin: No rashes, lesions, ulcers. No induration; Warm and dry.  Neurologic: CN 2-12 grossly intact with no focal deficits. Sensation decreased especially from Right Leg  to Right Nipple line and Left Leg up until Thigh, Romberg sign cerebellar reflexes not assessed.  Psychiatric: Normal judgment and insight. Alert and oriented x 3. Depressed mood and flat affect.   Data Reviewed: I have personally reviewed following labs and imaging studies  CBC:  Recent Labs Lab 12/25/15 0511 12/25/15 0700  12/26/15 0400 12/26/15 1730 12/27/15 0515 12/28/15 0828 12/28/15 1742 12/29/15 0805  WBC 8.4 6.8  --  9.3  --  9.3 12.2*  --  11.3*  NEUTROABS 8.1*  --   --   --   --  8.9*  --   --   --   HGB 7.7* 7.4*  < > 8.7* 8.8* 7.6* 9.3* 8.2* 7.5*  HCT 23.8* 22.7*  < > 25.1* 26.2* 22.4* 27.6* 24.4* 22.3*  MCV 93.7 93.4  --  89.3  --  91.1 93.6  --  92.1  PLT 302 278  --  206  --  175 145*  --  114*  < > = values in this interval not displayed. Basic Metabolic Panel:  Recent Labs Lab 12/25/15 0511 12/26/15 0400 12/27/15 0515 12/28/15 0510 12/29/15 0453    NA 140 138 141 141 139  K 4.8 4.5 4.6 4.5 4.4  CL 108 110 112* 113* 112*  CO2 25 24 23 23 23   GLUCOSE 132* 139* 131* 132* 127*  BUN 55* 63* 62* 49* 41*  CREATININE 0.75 0.84 0.90 0.81 0.67  CALCIUM 7.2* 7.2* 7.5* 7.5* 7.3*  MG  --   --  2.6* 2.6* 2.4  PHOS  --   --  3.6 3.6 2.8   GFR: Estimated Creatinine Clearance: 91.4 mL/min (by C-G formula based on SCr of 0.67 mg/dL). Liver Function Tests:  Recent Labs Lab 12/25/15 0511 12/26/15 0400 12/27/15 0515 12/28/15 0510 12/29/15 0453  AST 29 32 27 28 27   ALT 20 22 26 27 23   ALKPHOS 67 60 134* 65 56  BILITOT 0.6 1.0 0.7 0.8 0.8  PROT 4.3* 3.8* 3.8* 4.0* 3.8*  ALBUMIN 1.7* 1.8* 1.9* 1.9* 1.7*   No results for input(s): LIPASE, AMYLASE in the last 168 hours. No results for input(s): AMMONIA in the last 168 hours. Coagulation Profile: No results for input(s): INR, PROTIME in the last 168 hours. Cardiac Enzymes: No results for input(s): CKTOTAL, CKMB, CKMBINDEX, TROPONINI in the last 168 hours. BNP (last 3 results) No results for input(s): PROBNP in the last 8760 hours. HbA1C: No results for input(s): HGBA1C in the last 72 hours. CBG:  Recent Labs Lab 12/28/15 1600 12/28/15 2334 12/29/15 0722 12/29/15 1122 12/29/15 1614  GLUCAP 198* 133* 133* 121* 128*   Lipid Profile: No results for input(s): CHOL, HDL, LDLCALC, TRIG, CHOLHDL, LDLDIRECT in the last 72 hours. Thyroid Function Tests: No results for input(s): TSH, T4TOTAL, FREET4, T3FREE, THYROIDAB in the last 72 hours. Anemia Panel: No results for input(s): VITAMINB12, FOLATE, FERRITIN, TIBC, IRON, RETICCTPCT in the last 72 hours. Sepsis Labs: No results for input(s): PROCALCITON, LATICACIDVEN in the last 168 hours.  No results found for this or any previous visit (from the past 240 hour(s)).  Radiology Studies: No results found.  Scheduled Meds: . sodium chloride   Intravenous Once  . calcitonin (salmon)  1 spray Alternating Nares Daily  . dexamethasone  8 mg  Intravenous Q12H  . diltiazem  180 mg Oral Daily  . feeding supplement  1 Container Oral TID BM  . furosemide  40 mg Intravenous Once  . insulin aspart  0-5 Units  Subcutaneous QHS  . insulin aspart  0-9 Units Subcutaneous TID WC  . ipratropium-albuterol  3 mL Nebulization TID  . LORazepam  0.5 mg Oral Daily  . nicotine polacrilex  2 mg Oral Q2H  . sodium chloride flush  3 mL Intravenous Q12H  . timolol  1 drop Both Eyes BID   Continuous Infusions: . dextrose 5 % and 0.45 % NaCl with KCl 20 mEq/L 20 mL/hr at 12/29/15 1900  . lactated ringers Stopped (12/29/15 0700)  . pantoprozole (PROTONIX) infusion 8 mg/hr (12/29/15 1509)    LOS: 4 days   Kerney Elbe, DO [facility] Pager [phone]  If 7PM-7AM, please contact night-coverage [email or web address] Password [email or web address] 12/29/2015, 7:59 PM

## 2015-12-29 NOTE — Progress Notes (Signed)
Upon assessment, patient has increased swelling in bilateral hands and lower extremities +2. Patient c/o mild shob. Received 1 unit of blood today. With a total of 4 blood transfusions since admission 10/25. Crackles in lower bases. No cough or sputum production. Pt c/o right side abdominal pain. Paged NP Schorr. Explained to her the assessment findings. Ordered 1x dose of Lasix 40 mg IV. Creatinine 0.67. Foley in place. Will continue to monitor patient.

## 2015-12-30 ENCOUNTER — Ambulatory Visit: Payer: Medicare Other

## 2015-12-30 DIAGNOSIS — L89152 Pressure ulcer of sacral region, stage 2: Secondary | ICD-10-CM

## 2015-12-30 LAB — TYPE AND SCREEN
ABO/RH(D): O NEG
Antibody Screen: NEGATIVE
Unit division: 0

## 2015-12-30 LAB — CBC
HCT: 24.3 % — ABNORMAL LOW (ref 39.0–52.0)
Hemoglobin: 8.2 g/dL — ABNORMAL LOW (ref 13.0–17.0)
MCH: 30.8 pg (ref 26.0–34.0)
MCHC: 33.7 g/dL (ref 30.0–36.0)
MCV: 91.4 fL (ref 78.0–100.0)
PLATELETS: 94 10*3/uL — AB (ref 150–400)
RBC: 2.66 MIL/uL — ABNORMAL LOW (ref 4.22–5.81)
RDW: 17 % — AB (ref 11.5–15.5)
WBC: 14.3 10*3/uL — AB (ref 4.0–10.5)

## 2015-12-30 LAB — COMPREHENSIVE METABOLIC PANEL
ALT: 21 U/L (ref 17–63)
AST: 26 U/L (ref 15–41)
Albumin: 1.6 g/dL — ABNORMAL LOW (ref 3.5–5.0)
Alkaline Phosphatase: 53 U/L (ref 38–126)
Anion gap: 4 — ABNORMAL LOW (ref 5–15)
BUN: 31 mg/dL — ABNORMAL HIGH (ref 6–20)
CHLORIDE: 110 mmol/L (ref 101–111)
CO2: 24 mmol/L (ref 22–32)
Calcium: 7.2 mg/dL — ABNORMAL LOW (ref 8.9–10.3)
Creatinine, Ser: 0.69 mg/dL (ref 0.61–1.24)
Glucose, Bld: 116 mg/dL — ABNORMAL HIGH (ref 65–99)
POTASSIUM: 4.2 mmol/L (ref 3.5–5.1)
SODIUM: 138 mmol/L (ref 135–145)
Total Bilirubin: 0.8 mg/dL (ref 0.3–1.2)
Total Protein: 3.8 g/dL — ABNORMAL LOW (ref 6.5–8.1)

## 2015-12-30 LAB — MAGNESIUM: Magnesium: 2.1 mg/dL (ref 1.7–2.4)

## 2015-12-30 LAB — GLUCOSE, CAPILLARY
GLUCOSE-CAPILLARY: 141 mg/dL — AB (ref 65–99)
Glucose-Capillary: 113 mg/dL — ABNORMAL HIGH (ref 65–99)
Glucose-Capillary: 240 mg/dL — ABNORMAL HIGH (ref 65–99)
Glucose-Capillary: 120 mg/dL — ABNORMAL HIGH (ref 65–99)

## 2015-12-30 LAB — H PYLORI, IGM, IGG, IGA AB
H. PYLOGI, IGA ABS: 15.9 U — AB (ref 0.0–8.9)
H. Pylogi, Igm Abs: <9 units (ref 0.0–8.9)

## 2015-12-30 LAB — PHOSPHORUS: Phosphorus: 2.9 mg/dL (ref 2.5–4.6)

## 2015-12-30 MED ORDER — COLLAGENASE 250 UNIT/GM EX OINT
TOPICAL_OINTMENT | Freq: Every day | CUTANEOUS | Status: DC
  Administered 2015-12-30 – 2016-01-03 (×4): via TOPICAL
  Administered 2016-01-05: 1 via TOPICAL
  Administered 2016-01-06: 10:00:00 via TOPICAL
  Filled 2015-12-30 (×2): qty 30

## 2015-12-30 MED ORDER — HYDROCODONE-ACETAMINOPHEN 7.5-325 MG PO TABS
1.0000 | ORAL_TABLET | Freq: Four times a day (QID) | ORAL | Status: DC | PRN
  Administered 2015-12-31 – 2016-01-04 (×11): 1 via ORAL
  Filled 2015-12-30 (×12): qty 1

## 2015-12-30 MED ORDER — FENTANYL CITRATE (PF) 100 MCG/2ML IJ SOLN
12.5000 ug | INTRAMUSCULAR | Status: DC | PRN
  Administered 2015-12-30 – 2016-01-04 (×18): 50 ug via INTRAVENOUS
  Administered 2016-01-05: 25 ug via INTRAVENOUS
  Administered 2016-01-05 – 2016-01-06 (×6): 50 ug via INTRAVENOUS
  Filled 2015-12-30 (×25): qty 2

## 2015-12-30 NOTE — Care Management Important Message (Signed)
Important Message  Patient Details  Name: AJDIN LALLO MRN: [id number] Date of Birth: 10-30-1940   Medicare Important Message Given:  Yes    Bethena Roys, RN 12/30/2015, 12:49 PM

## 2015-12-30 NOTE — Progress Notes (Signed)
PROGRESS NOTE    Marcus [name]  [id number] DOB: [birth year] DOA: 12/25/2015 PCP: Jeanmarie Hubert, MD  Brief Narrative:  Marcus [name] is a 75 y.o. 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 Patient's current complaints include intermittent pulling or burning sensation of his epigastric and right upper and left upper quadrants. Sometimes describes the pain as a rope pulling him tight sensation. Worse with certain movements and deep breathing. Sometimes comes on as a crampy sensation that happens spontaneously. Pain typically lasts a few seconds. Per review of patient's recent care on the third floor and discussion of staff there are no reported dark or loose bloody stools prior to being transferred to CIR. Staff on CIR note dark to black stools for the last 24 hours. Denies any other new symptoms such as fevers, cough, shortness of breath, rash, palpitations, headache, neck stiffness. Symptoms appear to be getting worse. EGD done today and showed one non-bleeding ulcer with a no nonbleeding visible vessel as well as 2 other ulcers in the duodenal sweep and second portion but bleeding was coming from an area that was not visualized with the Endoscope. IR was consulted for Angiogram for possible embolization and General Surgery was also consulted as backup. Patient was transfused another unit of blood yesterday and Hb/Hct has been stable today and will repeat in Pm. Will likely go for Repeat EGD at some point.   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  Normocytic. CIR staff report dark stools for 24 hrs. 3W staff from previous department report nml stools. Likely secondary to upper GI bleed in the Duodenem from initiation of Eliquis.  -Ulceration given recent acute stress, anticoagulation and and high dose steroids. Will attempt to wean Steroids. Now down to Decadron 8 mg q12h -2 units PRBC per CIR team and 1 unit per GI team prior to Endoscopy -Hold Plavix and Eliquis  -Hb/Hct improved to 9.3/27.6 and then dropped to 7.5/22.3 yesterday; Repeat today showed 8.5/25.4 -> Now 8.2/24.3 -GI transfused another Unit of Blood yestreday; s/p 4 units total transfused -Gastroenterology to possibly repeat EGD when anesthesia assistance available and may not happen until 01/01/2016. May not pursue if patient's bleeding continues to resolve. -Per Surgery Continue Supportive Care and Medical Management with IV PPI, Holding Anticoagulation; Surgical Intervention not indicated at this time.  -REPEAT H/H this EVENING -Repeat CBC in AM  GI Bleed 2/2 to Duodenal Ulcer Bleed:  - EGD Done and showed  non-bleeding ulcer with a no nonbleeding visible vessel as well as 2 other ulcers in the duodenal sweep and second portion but bleeding was coming from an area that was not visualized with the Endoscope. - IR was consulted for Angiogram for possible embolization however Negative three-vessel mesenteric arteriogram. No evidence of active extravasation or other focal lesion to suggest etiology  of GI bleed.  -General Surgery was also consulted as backup incase Embolization could not be done.  - Stopped Plavix and Eliquis; GI Recommends Holding Oral Anticoagulant for now.  - IV Protonix back on for another 72 hours -GI Dr. Loletha Carrow  feels like submucosal epinephrine temporarily slowed or stopped the bleeding, or perhaps the bleeding is below the level of detection on angiogram because it is mucosal bleeding. 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 The patient underwent CT-guided core biopsy of right lower lobe/posterior mediastinal mass lesion 12/18/15. No evidence of malignancy of final pathology report. Likely Inflammation and Fibrosis -Blood cultures done on admission positive for MSSA, with surveillance blood cultures negative so far. Antibiotics switched to vancomycin 12/22/15 after sputum cultures grew MRSA. PICC line Placed 12/21/15.  -TEE done 12/20/15, negative for endocarditis.  -CIR admitted pt on 10/24 but then DC on 10/25 due to GI bleed - see above.   - 2weeks of vancomycin and a switch of his antibiotics back to Cefazolin to complete a 6 week course of antibiotics - Per ID recs.  - Vanc 10/21 --> -Continue Decadron for myelopathy from compression per Neursurgical recs. - Weaning 10 mg q6h to 8 mg q12h now as patient has concern for GI Bleed - Repeat MRI showed Similar appearance of abnormal signal intensity involving the T6 and T7 vertebral bodies with associated T7 compression fracture, with direct extension into the right lower lobe pulmonary process.Overall, inflammatory changes within the right lower lobe areimproved, with decreased size of right lower lobe lesion as compared to prior MRI from 12/16/2015. Given the interval improvement, changes suggest an improving  infectious process. T7 compression fracture with 50% height loss and 4 mm bony retropulsion is stable. 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 Overall improvement in previously seen spinal canal stenosis,a lthough there remains persistent moderate to severe stenosis at the level of T7. 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 Layering left pleural effusion. -Pain control with IV Fentanyl and started Norco 5/325 mg q6hprn for Pain -PT/OT recommend transfer back to CIR once stable.   Leukocytosis: -WBC went from 9.3 -> 12.2 -> 11.3 -> 14.3 -Likely reactive from stress to yesterday's EGD and IR procedure; Possibly from Demarginalization from Long Term Steroid Use; Patient Afebrile and on Abx -Repeat CBC in AM  Urinary retention: -Foley in place. Likely from T7 compression fracture and Infection -Continue Foley. -Outpt f/u w/ Urology  Afib RVR/CAD:  -Currently rate controlled. And in NSR today -Cards, Dr. Tamala Julian, Consulted on day of re-admit due to significant cardiac history on ASA and plavix w/ GI bleed.  - F/u Cards recs regarding anticoagulation - currently recommending restarting Eliquis as monotherapy after GI bleed resolution.  - Continue with Telemetry - Continue Diltiazem 180 mg daily - GI recommends Against Anticoagulation with NOAC for now, not until the bleeding sources have had time to heal  Bilat LE DVT:  -LE duplex on 10/25 noted DVT in the right  soleal veins and the left peroneal veins. Likely from prolonged hospitalization and being bedbound -Hold anticoagulation at this time due to GI bleed -no SCD -Discussed with IR Dr. Anselm Pancoast  12/27/15 and he recommended waiting on full GI evaluation prior to IVC Filter placement.  -GI states IVC filter should be considered given his recent DVT -Will Pursue IVC Filter after GI Bleeding  resolves.  Pain: secondary to T7 compression fracture, infection, GI bleed, and prolonged hospital stay being bed bound. -Fentanyl PRN -Started Norco 5/325 mg -Intranasal calcitonin -Tylenol  Anxiety/Insomnia: -Continue Ativan -Trazodone QHS prn  Severe Protein calorie malnutrition:  - Albuimin1.7 and total protein 4.3.  - Poor appetite and oral intake since admission.  - Poor prognostic indicator for ability to heal - Nutrition consult - Ensure TID between meals once taking PO - Pre-albumin  H/o TIA/CVA:  - No residual deficits. S/p carotid endarterectomy - DC plavix as above  Pressure Ulcer to the Sacrum; Stage 2 (poA) -Appreciate Wound Care Nurse Recc's -Evaluated by wound care nurse. Foam dressings ordered and Santyl for Sacrum wound -Pressure reduction mattress ordered by admitting team. -Air overlay mattress; C/w Turning  HLD:  -C/w Statin  DVT prophylaxis: None because of GI Bleed Risk and no SCD's because of DVT Code Status: Full Family Communication: Discussed plan of care with wife at beside and patient's cousin.  Disposition Plan: Inpatient Rehab when stable  Consultants:   GI  Cardiology  Interventional Radiology  Infectious Diseases  General Surgery  Procedures: EGD; Mesenteric Angiogram  Antimicrobials: Vanc and Cefazolin after completion of Vanc  Subjective: Patient was seen and examined today and he stated he has felt the best he has felt in a while. Had a dark bowel movement last night and stated he didn't sleep well. No CP or lightheadedness. Discussed plan of care with wife and patient to possibly undergo a repeat EGD pending his status.   Objective: Vitals:   12/30/15 0031 12/30/15 0642 12/30/15 0754 12/30/15 0832  BP: (!) 100/55 110/61  112/71  Pulse: (!) 56 64  67  Resp: 19 (!) 22    Temp: 97.7 F (36.5 C) 98.3 F (36.8 C)  97.8 F (36.6 C)  TempSrc: Oral Oral  Oral  SpO2: 99% 99% 100% 100%  Weight:  99.7 kg (219 lb 12.8  oz)      Intake/Output Summary (Last 24 hours) at 12/30/15 1142 Last data filed at 12/30/15 0658  Gross per 24 hour  Intake          1676.75 ml  Output             3275 ml  Net         -1598.25 ml   Filed Weights   12/28/15 0453 12/29/15 0712 12/30/15 0642  Weight: 94.7 kg (208 lb 12.8 oz) 94.4 kg (208 lb 3.2 oz) 99.7 kg (219 lb 12.8 oz)   Examination: Physical Exam:  Constitutional: Chronic Ill appearing 75 year old male who appears calm today Eyes: Lids and conjunctivae normal, sclerae anicteric  ENMT: External Ears, Nose appear normal. Grossly normal hearing.  Neck: Appears normal, supple, no cervical masses, normal ROM, no appreciable thyromegaly, no JVD Respiratory: Diminished bilaterally but no wheezing or rhonchi. No appreciable crackles. Normal respiratory effort with no tachypenia. No accessory muscle use.  Cardiovascular: RRR, no murmurs / rubs / gallops. S1 and S2 auscultated. 2+ Lower extremity edema. Abdomen: Soft, non-tender, non-distended. No masses palpated. No appreciable hepatosplenomegaly. Bowel sounds positive.  GU: Deferred. Musculoskeletal:  No clubbing / cyanosis of digits/nails. No joint deformity upper and lower extremities. No contractures. Decreased strength and muscle tone in LE still with 0/5 in Strength. States he feels when you touch "sometimes."    Skin: No rashes, lesions, ulcers. No induration; Warm and dry.  Neurologic: CN 2-12 grossly intact with no focal deficits. Sensation decreased especially from Right Leg to Right Nipple line and Left Leg up until Thigh, Romberg sign cerebellar reflexes not assessed.  Psychiatric: Normal judgment and insight. Alert and oriented x 3. Pleasant and cheery mood and and normal affect.   Data Reviewed: I have personally reviewed following labs and imaging studies  CBC:  Recent Labs Lab 12/25/15 0511  12/26/15 0400  12/27/15 0515 12/28/15 0828 12/28/15 1742 12/29/15 0805 12/29/15 2030 12/30/15 0456  WBC 8.4   < > 9.3  --  9.3 12.2*  --  11.3*  --  14.3*  NEUTROABS 8.1*  --   --   --  8.9*  --   --   --   --   --   HGB 7.7*  < > 8.7*  < > 7.6* 9.3* 8.2* 7.5* 8.5* 8.2*  HCT 23.8*  < > 25.1*  < > 22.4* 27.6* 24.4* 22.3* 25.4* 24.3*  MCV 93.7  < > 89.3  --  91.1 93.6  --  92.1  --  91.4  PLT 302  < > 206  --  175 145*  --  114*  --  94*  < > = values in this interval not displayed. Basic Metabolic Panel:  Recent Labs Lab 12/26/15 0400 12/27/15 0515 12/28/15 0510 12/29/15 0453 12/30/15 0456  NA 138 141 141 139 138  K 4.5 4.6 4.5 4.4 4.2  CL 110 112* 113* 112* 110  CO2 24 23 23 23 24   GLUCOSE 139* 131* 132* 127* 116*  BUN 63* 62* 49* 41* 31*  CREATININE 0.84 0.90 0.81 0.67 0.69  CALCIUM 7.2* 7.5* 7.5* 7.3* 7.2*  MG  --  2.6* 2.6* 2.4 2.1  PHOS  --  3.6 3.6 2.8 2.9   GFR: Estimated Creatinine Clearance: 93.9 mL/min (by C-G formula based on SCr of 0.69 mg/dL). Liver Function Tests:  Recent Labs Lab 12/26/15 0400 12/27/15 0515 12/28/15 0510 12/29/15 0453 12/30/15 0456  AST 32 27 28 27 26   ALT 22 26 27 23 21   ALKPHOS 60 134* 65 56 53  BILITOT 1.0 0.7 0.8 0.8 0.8  PROT 3.8* 3.8* 4.0* 3.8* 3.8*  ALBUMIN 1.8* 1.9* 1.9* 1.7* 1.6*   No results for input(s): LIPASE, AMYLASE in the last 168 hours. No results for input(s): AMMONIA in the last 168 hours. Coagulation Profile:  Recent Labs Lab 12/29/15 2030  INR 1.16   Cardiac Enzymes: No results for input(s): CKTOTAL, CKMB, CKMBINDEX, TROPONINI in the last 168 hours. BNP (last 3 results) No results for input(s): PROBNP in the last 8760 hours. HbA1C: No results for input(s): HGBA1C in the last 72 hours. CBG:  Recent Labs Lab 12/29/15 1122 12/29/15 1614 12/29/15 2117 12/30/15 0756 12/30/15 1120  GLUCAP 121* 128* 111* 113* 141*   Lipid Profile: No results for input(s): CHOL, HDL, LDLCALC, TRIG, CHOLHDL, LDLDIRECT in the last 72 hours. Thyroid Function Tests: No results for input(s): TSH, T4TOTAL, FREET4, T3FREE,  THYROIDAB in the last 72 hours. Anemia Panel: No results for input(s): VITAMINB12, FOLATE, FERRITIN, TIBC, IRON, RETICCTPCT in the last 72 hours. Sepsis Labs: No results for input(s): PROCALCITON, LATICACIDVEN in the last 168 hours.  No  results found for this or any previous visit (from the past 240 hour(s)).  Radiology Studies: No results found.  Scheduled Meds: . sodium chloride   Intravenous Once  . calcitonin (salmon)  1 spray Alternating Nares Daily  . collagenase   Topical Daily  . dexamethasone  8 mg Intravenous Q12H  . diltiazem  180 mg Oral Daily  . feeding supplement  1 Container Oral TID BM  . insulin aspart  0-5 Units Subcutaneous QHS  . insulin aspart  0-9 Units Subcutaneous TID WC  . ipratropium-albuterol  3 mL Nebulization TID  . LORazepam  0.5 mg Oral Daily  . nicotine polacrilex  2 mg Oral Q2H  . sodium chloride flush  3 mL Intravenous Q12H  . timolol  1 drop Both Eyes BID   Continuous Infusions: . lactated ringers Stopped (12/29/15 0700)  . pantoprozole (PROTONIX) infusion 8 mg/hr (12/30/15 1136)    LOS: 5 days   Kerney Elbe, DO [facility] Pager [phone]  If 7PM-7AM, please contact night-coverage [email or web address] Password [email or web address] 12/30/2015, 11:42 AM

## 2015-12-30 NOTE — Progress Notes (Signed)
CSW spoke with pt and pt dtr about possible SNF back up for CIR.  Pt very clear that he wants to go back to CIR or will return home with help from family- states that his wife is very supportive and already working on making the house more accessible for him for when he eventually returns home.  CSW informed RNCM that pt would want home services if CIR cannot readmit  CSW signing off  Jorge Ny, [place] Social Worker [phone]

## 2015-12-30 NOTE — Progress Notes (Addendum)
Daily Rounding Note  12/30/2015, 9:21 AM  LOS: 5 days   SUBJECTIVE:   Chief complaint: abdominal pain, nausea.   Pain much improved.  No bleeding overnight.  Stool last night still dark, burgundy vs black, tarry. Says he feels great  OBJECTIVE:         Vital signs in last 24 hours:    Temp:  [97.7 F (36.5 C)-98.3 F (36.8 C)] 97.8 F (36.6 C) (10/30 0832) Pulse Rate:  [54-67] 67 (10/30 0832) Resp:  [17-22] 22 (10/30 0642) BP: (100-127)/(55-90) 112/71 (10/30 0832) SpO2:  [94 %-100 %] 100 % (10/30 0832) Weight:  [99.7 kg (219 lb 12.8 oz)] 99.7 kg (219 lb 12.8 oz) (10/30 0642) Last BM Date: 12/29/15 Filed Weights   12/28/15 0453 12/29/15 0712 12/30/15 [id number]  Weight: 94.7 kg (208 lb 12.8 oz) 94.4 kg (208 lb 3.2 oz) 99.7 kg (219 lb 12.8 oz)   General: in good spirits, looks better   Heart:  Irreg, irreg Chest: diminished but clear, no coughing like he did in past few days Abdomen: soft, NT, ND.  BS hypoactive  Extremities: some non-pitting edema in UE and LE Neuro/Psych:  Oriented x 3.  Joking.  Less agitated but still animated.   Intake/Output from previous day: 10/29 0701 - 10/30 0700 In: 1676.8 [P.O.:80; I.V.:1266.8; Blood:330] Out: [id number] [Urine:3275]  Intake/Output this shift: No intake/output data recorded.  Lab Results:  Recent Labs  12/28/15 0828  12/29/15 0805 12/29/15 2030 12/30/15 0456  WBC 12.2*  --  11.3*  --  14.3*  HGB 9.3*  < > 7.5* 8.5* 8.2*  HCT 27.6*  < > 22.3* 25.4* 24.3*  PLT 145*  --  114*  --  94*  < > = values in this interval not displayed. BMET  Recent Labs  12/28/15 0510 12/29/15 0453 12/30/15 0456  NA 141 139 138  K 4.5 4.4 4.2  CL 113* 112* 110  CO2 23 23 24   GLUCOSE 132* 127* 116*  BUN 49* 41* 31*  CREATININE 0.81 0.67 0.69  CALCIUM 7.5* 7.3* 7.2*   LFT  Recent Labs  12/28/15 0510 12/29/15 0453 12/30/15 0456  PROT 4.0* 3.8* 3.8*  ALBUMIN 1.9* 1.7* 1.6*    AST 28 27 26   ALT 27 23 21   ALKPHOS 65 56 53  BILITOT 0.8 0.8 0.8   PT/INR  Recent Labs  12/29/15 2030  LABPROT 14.9  INR 1.16    Scheduled Meds: . sodium chloride   Intravenous Once  . calcitonin (salmon)  1 spray Alternating Nares Daily  . dexamethasone  8 mg Intravenous Q12H  . diltiazem  180 mg Oral Daily  . feeding supplement  1 Container Oral TID BM  . insulin aspart  0-5 Units Subcutaneous QHS  . insulin aspart  0-9 Units Subcutaneous TID WC  . ipratropium-albuterol  3 mL Nebulization TID  . LORazepam  0.5 mg Oral Daily  . nicotine polacrilex  2 mg Oral Q2H  . sodium chloride flush  3 mL Intravenous Q12H  . timolol  1 drop Both Eyes BID   Continuous Infusions: . lactated ringers Stopped (12/29/15 0700)  . pantoprozole (PROTONIX) infusion 8 mg/hr (12/30/15 0300)   PRN Meds:.acetaminophen **OR** acetaminophen, feeding supplement (ENSURE ENLIVE), fentaNYL (SUBLIMAZE) injection, HYDROcodone-acetaminophen, LORazepam, ondansetron (ZOFRAN) IV, polyethylene glycol, promethazine **OR** promethazine **OR** promethazine, senna-docusate, sodium chloride flush, traZODone   ASSESMENT:    * Upper GI bleed in setting Eliquis, Plavix.  10/27 EGD:  duodenal bulb ulcer with adherent clot. Non-bleeding duodenal ulcers as well. Blood throughout entire examined duodenum; source of this seemed to be in proximal duodenal sweep across from the bulbar ulcer. Unable to locate exact bleeding lesion (? if another ulcer vs AVM), the area was injected with epinephrine but bleeding did not cease.  10/27 angiogram: negative for active bleeding or source of bleeding.   72 hours Protonix drip finished 10/29 at 0600 but continued.   Still with dark stools but pain, nausea resolved.   * ABL anemia. S/p PRBC x 4 (2 while in rehab). Last on 10/29 with appropriate 1 gm response.  Hgb down close to 2 gm in last 24 hours.     *  Thrombocytopenia.   * Paraplegia, due to T 7compression  fracture. Cause of fracture felt to be infectious , Weaning off Decadron.   * A fib, DVT bil LE 10/25 (DVT despite Eliquis). Eliquis, Plavix on hold due to GI bleed.   * Right lung mass.  10/18 Ct guided lung biopsy: Inflammatory process.     PLAN   *  Set up for repeat EGD when anesthesia assistance available.  This may not happen until 11/1.  Dr Fuller Plan may not pursue if pt's bleeding continues to show resolution.    Azucena Freed  12/30/2015, 9:21 AM Pager: [phone]     Attending physician's note   I have taken an interval history, reviewed the chart and examined the patient. I agree with the Advanced Practitioner's note, impression and recommendations. UGI bleed from duodenum with known duodenal ulcers however areas of duodenum were obscured by blood at EGD. Tentatively plan for repeat EGD on 11/1 when anesthesia support in endoscopy unit is available. If bleeding has clearly stopped will not repeat the EGD. Continue IV pantoprazole infusion for now. Trend CBC.   Lucio Edward, MD [facility] [phone] Mon-Fri 8a-5p [phone] after 5p, weekends, holidays

## 2015-12-30 NOTE — Care Management Note (Addendum)
Case Management Note  Patient Details  Name: Marcus [name] MRN: [id number] Date of Birth: [birth year]  Subjective/Objective:  Pt previously here for Afib RVR, Closed Fx T7 and RLL Mass. Pt was d/c to CIR. Pt now a readmit from CIR due to GI Bleed. GI and Surgery following.                   Action/Plan: CM will continue to monitor for disposition needs.   Expected Discharge Date:                  Expected Discharge Plan:  IP Rehab Facility  In-House Referral:  Clinical Social Work  Discharge planning Services  CM Consult  Post Acute Care Choice:    Choice offered to:     DME Arranged:    DME Agency:     HH Arranged:    [place] Agency:     Status of Service:  In process, will continue to follow  If discussed at Long Length of Stay Meetings, dates discussed:  12-31-15, 01-02-16, 01-07-16  Additional Comments: 01-07-16 Jacqlyn Krauss, RN,BSN [phone] IVC Filter was planned for 01-07-16. Pt is refusing IVC Filter @ this time. Palliative Care Meeting with family this am. Pt is now DNR-full comfort care. CM will continue to monitor for plan of care.    1034 01-03-16 Jacqlyn Krauss, RN,BSN [phone] CM continues to follow patient. Psych was consulted due to increased confusion. Palliative Care to consult as well. CM will continue to monitor for disposition needs.  Bethena Roys, RN 12/30/2015, 3:30 PM

## 2015-12-30 NOTE — Progress Notes (Signed)
Physical Therapy Treatment Patient Details Name: Marcus [name] MRN: [id number] DOB: 1941/01/01 Today's Date: 12/30/2015    History of Present Illness 75 y.o. male being transferred back to acute care services from CIR with epigastric and RUQ pain.  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 Also found to have GIB, but unable to locate source with EGD.  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    PT Comments    Patient progressing slowly due to pain, but able to tolerate up at EOB for functional task.  Feel continued skilled PT indicated to progress to CIR level rehab for interdisciplinary rehab and caregiver education prior to d/c home.   Follow Up Recommendations  CIR     Equipment Recommendations  Wheelchair (measurements PT);Wheelchair cushion (measurements PT);3in1 (PT);Other (comment)    Recommendations for Other Services Rehab consult     Precautions / Restrictions Precautions Precautions: Fall Precaution Comments: Paraplegia, multiple sacral and buttocks wounds; limit time OOB to 2 hours at a time Restrictions Weight Bearing Restrictions: No    Mobility  Bed Mobility Overal bed mobility: Needs Assistance;+2 for physical assistance Bed Mobility: Rolling Rolling: Max assist Sidelying to sit: Max assist;+2 for physical assistance;HOB elevated     Sit to sidelying: Max assist;+2 for physical assistance General bed mobility comments: kept HOB elevated at pt request due to increased pain with lowered HOB, pt with difficulty pushing up on rail due to bed positioning, assist for legs and lower trunk to sidelying and to bring legs off bed and increased time with +2 assist to lift upper trunk to sit.  To side with assist for legs and trunk and positioned in sidelying per pt request.  Transfers                 General transfer comment: unable due to pain sitting EOB  Ambulation/Gait                  Stairs            Wheelchair Mobility    Modified Rankin (Stroke Patients Only)       Balance Overall balance assessment: Needs assistance Sitting-balance support: Feet supported;Single extremity supported Sitting balance-Leahy Scale: Zero Sitting balance - Comments: needed support from behind sitting EOB due to pain, needed stabilziation during reaching task at EOB for self feeding                            Cognition Arousal/Alertness: Awake/alert Behavior During Therapy: Anxious   Area of Impairment: Attention;Problem solving   Current Attention Level: Selective         Problem Solving: Difficulty sequencing;Requires verbal cues;Requires tactile cues      Exercises Other Exercises Other Exercises: stretching bilateral LE's for hamstrings, gluts, hip IR's and adductors, and for PF's 2-3 reps hold 10-20 seconds each leg    General Comments        Pertinent Vitals/Pain Faces Pain Scale: Hurts whole lot Pain Location: shoulders and back with mobility Pain Descriptors / Indicators: Aching;Discomfort;Grimacing;Guarding Pain Intervention(s): Limited activity within patient's tolerance;Monitored during session;Repositioned;Premedicated before session    Home Living                      Prior Function            PT Goals (current goals can now be found in the care plan section)  Progress towards PT goals: Progressing toward goals    Frequency    Min 5X/week      PT Plan Current plan remains appropriate    Co-evaluation             End of Session   Activity Tolerance: Patient limited by pain;Patient limited by fatigue Patient left: in bed;with call bell/phone within reach     Time: 0942-1022 PT Time Calculation (min) (ACUTE ONLY): 40 min  Charges:  $Therapeutic Exercise: 8-22 mins $Therapeutic Activity: 23-37 mins                    G Codes:      Reginia Naas 01/14/2016, 11:49 AM  Magda Kiel,  [place] 01-14-2016

## 2015-12-30 NOTE — Consult Note (Addendum)
[facility] Nurse wound follow-up consult note Sacrum and bilat buttocks re-assessment.  Wound type: Inner gluteal fold with previous deep tissue injury has evolved into 80% stage 2 pressure injury; 20% unstageable; located over sacrum area, .5X.5cm, yellow slough. 2X1X.1cm entire area; 80% pink and dry Bilat buttocks surrounding the gluteal fold with previous dark purple-reddish deep tissue injury areas have evolved at this time into multiple patchy areas of stage 2 pressure injuries; red and moist, small amt pink drainage; entire affected area is10X10cm.  Pressure Ulcer POA: Yes Dressing procedure/placement/frequency: Discussed plan of care with patient and importance of turning off the affected area as much as possible. Foam dressing remains in place to protect and absorb drainage, add Santyl for sacrum wound. Air mattress in place reduce pressure to the locations, and pressure redistribution cushion at the bedside when OOB to chair. Pt should limit time in chair with therapy to an hour or less to reduce prolonged pressure over the affected areas.  Please re-consult if further assistance is needed. Thank-you,  Julien Girt MSN, [place], [place], [place], [place]

## 2015-12-30 NOTE — Progress Notes (Signed)
MD made aware foley placed by urology, RN to leave foley in place per MD and MD will make arrangements to follow up with urology outpatient.

## 2015-12-30 NOTE — Progress Notes (Addendum)
[facility] Progress Note  3 Days Post-Op  Subjective: No emesis, minimal pain this morning. No hematochezia or melena overnight. Received one unit of PRBC yesterday- hemoglobin responded appropriately and is stable this morning. Vitals are stable.   Objective: Vital signs in last 24 hours: Temp:  [97.7 F (36.5 C)-98.3 F (36.8 C)] 97.8 F (36.6 C) (10/30 0832) Pulse Rate:  [54-67] 67 (10/30 0832) Resp:  [17-22] 22 (10/30 0642) BP: (100-127)/(55-90) 112/71 (10/30 0832) SpO2:  [94 %-100 %] 100 % (10/30 0832) Weight:  [99.7 kg (219 lb 12.8 oz)] 99.7 kg (219 lb 12.8 oz) (10/30 [id number]) Last BM Date: 12/29/15  Intake/Output from previous day: 10/29 0701 - 10/30 0700 In: 1676.8 [P.O.:80; I.V.:1266.8; Blood:330] Out: [id number] [Urine:3275] Intake/Output this shift: No intake/output data recorded.  PE: Gen:  Alert, NAD, pleasant Card:  Irregularly irregular Abd: Soft, mild upper abdominal tenderness, no peritonitis    Lab Results:   Recent Labs  12/29/15 0805 12/29/15 2030 12/30/15 0456  WBC 11.3*  --  14.3*  HGB 7.5* 8.5* 8.2*  HCT 22.3* 25.4* 24.3*  PLT 114*  --  94*   BMET  Recent Labs  12/29/15 0453 12/30/15 0456  NA 139 138  K 4.4 4.2  CL 112* 110  CO2 23 24  GLUCOSE 127* 116*  BUN 41* 31*  CREATININE 0.67 0.69  CALCIUM 7.3* 7.2*   PT/INR  Recent Labs  12/29/15 2030  LABPROT 14.9  INR 1.16   CMP     Component Value Date/Time   NA 138 12/30/2015 0456   NA 139 06/21/2015 0829   K 4.2 12/30/2015 0456   CL 110 12/30/2015 0456   CO2 24 12/30/2015 0456   GLUCOSE 116 (H) 12/30/2015 0456   BUN 31 (H) 12/30/2015 0456   BUN 20 06/21/2015 0829   CREATININE 0.69 12/30/2015 0456   CREATININE 1.22 (H) 09/16/2015 0840   CALCIUM 7.2 (L) 12/30/2015 0456   PROT 3.8 (L) 12/30/2015 0456   PROT 6.6 06/21/2015 0829   ALBUMIN 1.6 (L) 12/30/2015 0456   ALBUMIN 3.9 06/21/2015 0829   AST 26 12/30/2015 0456   ALT 21 12/30/2015 0456   ALKPHOS 53  12/30/2015 0456   BILITOT 0.8 12/30/2015 0456   BILITOT 0.3 06/21/2015 0829   GFRNONAA >60 12/30/2015 0456   GFRNONAA 59 (L) 09/16/2015 0840   GFRAA >60 12/30/2015 0456   GFRAA 68 09/16/2015 0840   Lipase     Component Value Date/Time   LIPASE 19 12/01/2015 1820       Studies/Results: No results found.  Anti-infectives: Anti-infectives    Start     Dose/Rate Route Frequency Ordered Stop   12/25/15 1700  vancomycin (VANCOCIN) 1,250 mg in sodium chloride 0.9 % 250 mL IVPB  Status:  Discontinued     1,250 mg 166.7 mL/hr over 90 Minutes Intravenous Every 12 hours 12/25/15 1654 12/25/15 1931     Assessment/Plan Upper GI bleed, Melena Acute blood loss anemia - 10/25 2 units PRBC, Plavix and Eloquis held, PPI drip started - 10/27 1 unit PRBC, Upper Endoscopy, Dr. Loletha Carrow; one, deep non-bleeding ulcer of anterior duodenal bulb, blood in duodenum with source unclear - not able to treat.  - 10/27 IR performed mesenteric angiogram - no bleeding seen.  - 10/29 1u PRBC, Hgb this morning 8.2 (8.5 last night, 7.5 before transfusion yesterday)  FEN: NPO GI proph: protonix drip VTE: anticoag held due to GIB, SCD's held due to BL LE DVT. Consider IVC filter.  Plan: Continue supportive care and medical mgmt with IV PPI, hold anticoagulants, serial CBC. Surgery is not indicated at this time.    LOS: 5 days    [facility] 12/30/2015, 9:10 AM

## 2015-12-31 ENCOUNTER — Ambulatory Visit: Payer: Medicare Other

## 2015-12-31 DIAGNOSIS — R0602 Shortness of breath: Secondary | ICD-10-CM

## 2015-12-31 LAB — GLUCOSE, CAPILLARY
Glucose-Capillary: 131 mg/dL — ABNORMAL HIGH (ref 65–99)
Glucose-Capillary: 97 mg/dL (ref 65–99)
Glucose-Capillary: 135 mg/dL — ABNORMAL HIGH (ref 65–99)

## 2015-12-31 LAB — COMPREHENSIVE METABOLIC PANEL
ALBUMIN: 1.5 g/dL — AB (ref 3.5–5.0)
ALT: 26 U/L (ref 17–63)
AST: 32 U/L (ref 15–41)
Alkaline Phosphatase: 59 U/L (ref 38–126)
Anion gap: 4 — ABNORMAL LOW (ref 5–15)
BILIRUBIN TOTAL: 0.5 mg/dL (ref 0.3–1.2)
BUN: 27 mg/dL — AB (ref 6–20)
CHLORIDE: 110 mmol/L (ref 101–111)
CO2: 24 mmol/L (ref 22–32)
CREATININE: 0.63 mg/dL (ref 0.61–1.24)
Calcium: 7.3 mg/dL — ABNORMAL LOW (ref 8.9–10.3)
GFR calc Af Amer: >60 mL/min (ref >60)
GFR calc non Af Amer: >60 mL/min (ref >60)
GLUCOSE: 114 mg/dL — AB (ref 65–99)
POTASSIUM: 4.3 mmol/L (ref 3.5–5.1)
Sodium: 138 mmol/L (ref 135–145)
Total Protein: 3.7 g/dL — ABNORMAL LOW (ref 6.5–8.1)

## 2015-12-31 LAB — CBC
HEMATOCRIT: 24 % — AB (ref 39.0–52.0)
Hemoglobin: 8.1 g/dL — ABNORMAL LOW (ref 13.0–17.0)
MCH: 31 pg (ref 26.0–34.0)
MCHC: 33.8 g/dL (ref 30.0–36.0)
MCV: 92 fL (ref 78.0–100.0)
PLATELETS: 78 10*3/uL — AB (ref 150–400)
RBC: 2.61 MIL/uL — AB (ref 4.22–5.81)
RDW: 17.6 % — AB (ref 11.5–15.5)
WBC: 13.1 10*3/uL — AB (ref 4.0–10.5)

## 2015-12-31 MED ORDER — DEXAMETHASONE SODIUM PHOSPHATE 4 MG/ML IJ SOLN
4.0000 mg | Freq: Two times a day (BID) | INTRAMUSCULAR | Status: DC
  Administered 2015-12-31 – 2016-01-03 (×7): 4 mg via INTRAVENOUS
  Filled 2015-12-31 (×7): qty 1

## 2015-12-31 MED ORDER — SODIUM CHLORIDE 0.9 % IV SOLN
INTRAVENOUS | Status: DC
  Administered 2015-12-31: 11:00:00 via INTRAVENOUS

## 2015-12-31 MED ORDER — ALUM & MAG HYDROXIDE-SIMETH 200-200-20 MG/5ML PO SUSP
30.0000 mL | ORAL | Status: DC | PRN
  Administered 2015-12-31 – 2016-01-10 (×5): 30 mL via ORAL
  Filled 2015-12-31 (×6): qty 30

## 2015-12-31 MED ORDER — PANTOPRAZOLE SODIUM 40 MG PO TBEC
40.0000 mg | DELAYED_RELEASE_TABLET | Freq: Two times a day (BID) | ORAL | Status: DC
  Administered 2015-12-31 – 2016-01-09 (×17): 40 mg via ORAL
  Filled 2015-12-31 (×18): qty 1

## 2015-12-31 NOTE — Progress Notes (Signed)
Daily Rounding Note  12/31/2015, 8:41 AM  LOS: 6 days   SUBJECTIVE:   Chief complaint:   Mild abd pain, now in LUQ.  Appetite poor, stool yesterday 0700, still black.  Feels better.  OBJECTIVE:         Vital signs in last 24 hours:    Temp:  [97.7 F (36.5 C)-98.3 F (36.8 C)] 97.9 F (36.6 C) (10/31 0348) Pulse Rate:  [57-73] 62 (10/31 0721) Resp:  [18-23] 23 (10/31 0721) BP: (115-119)/(63-73) 115/63 (10/31 0348) SpO2:  [93 %-100 %] 99 % (10/31 0721) Weight:  [100.7 kg (222 lb 0.1 oz)] 100.7 kg (222 lb 0.1 oz) (10/31 0500) Last BM Date: 12/29/15 Filed Weights   12/29/15 0712 12/30/15 0642 12/31/15 0500  Weight: 94.4 kg (208 lb 3.2 oz) 99.7 kg (219 lb 12.8 oz) 100.7 kg (222 lb 0.1 oz)   General: pleasant, alert   Heart: irreg, irreg, rate controlled.  Chest: clear bil.  No cough or dyspnea Abdomen: soft, BS hypoactive, NT, ND  Extremities: edema in LE. Neuro/Psych:  unnable to move legs, no sensation in feet.  No tremor.   Intake/Output from previous day: 10/30 0701 - 10/31 0700 In: -  Out: 600 [Urine:600]  Intake/Output this shift: No intake/output data recorded.  Lab Results:  Recent Labs  12/29/15 0805 12/29/15 2030 12/30/15 0456 12/31/15 0354  WBC 11.3*  --  14.3* 13.1*  HGB 7.5* 8.5* 8.2* 8.1*  HCT 22.3* 25.4* 24.3* 24.0*  PLT 114*  --  94* 78*   BMET  Recent Labs  12/29/15 0453 12/30/15 0456  NA 139 138  K 4.4 4.2  CL 112* 110  CO2 23 24  GLUCOSE 127* 116*  BUN 41* 31*  CREATININE 0.67 0.69  CALCIUM 7.3* 7.2*   LFT  Recent Labs  12/29/15 0453 12/30/15 0456  PROT 3.8* 3.8*  ALBUMIN 1.7* 1.6*  AST 27 26  ALT 23 21  ALKPHOS 56 53  BILITOT 0.8 0.8   PT/INR  Recent Labs  12/29/15 2030  LABPROT 14.9  INR 1.16   Hepatitis Panel No results for input(s): HEPBSAG, HCVAB, HEPAIGM, HEPBIGM in the last 72 hours.  Studies/Results: No results found.  Scheduled Meds: .  sodium chloride   Intravenous Once  . calcitonin (salmon)  1 spray Alternating Nares Daily  . collagenase   Topical Daily  . dexamethasone  4 mg Intravenous Q12H  . diltiazem  180 mg Oral Daily  . feeding supplement  1 Container Oral TID BM  . insulin aspart  0-5 Units Subcutaneous QHS  . insulin aspart  0-9 Units Subcutaneous TID WC  . ipratropium-albuterol  3 mL Nebulization TID  . LORazepam  0.5 mg Oral Daily  . nicotine polacrilex  2 mg Oral Q2H  . sodium chloride flush  3 mL Intravenous Q12H  . timolol  1 drop Both Eyes BID   Continuous Infusions: . sodium chloride    . lactated ringers Stopped (12/29/15 0700)  . pantoprozole (PROTONIX) infusion 8 mg/hr (12/30/15 2329)   PRN Meds:.acetaminophen **OR** acetaminophen, feeding supplement (ENSURE ENLIVE), fentaNYL (SUBLIMAZE) injection, HYDROcodone-acetaminophen, LORazepam, ondansetron (ZOFRAN) IV, polyethylene glycol, promethazine **OR** promethazine **OR** promethazine, senna-docusate, sodium chloride flush, traZODone   ASSESMENT:   * Upper GI bleed in setting Eliquis,Plavix.  10/27 EGD: duodenal bulb ulcer with adherent clot. Non-bleeding duodenal ulcers as well. Blood throughout entire examined duodenum; source of this seemed to be in proximal duodenal sweep across from the  bulbar ulcer. Unable to locate exact bleeding lesion (? if another ulcer vs AVM), the area was injected with epinephrine but bleeding did not cease.  10/27 angiogram: negative for active bleeding or source of bleeding.  72 hours Protonixdrip finished10/29 at 0600 but continued.   Still with dark stools but pain, nausea resolved.   * ABL anemia. S/p PRBC x 4 (2 while in rehab). Last on 10/29 with appropriate 1 gm response.  Downward trending Hgb.   *  Thrombocytopenia.   *  Protein cal malnutrition. Anorexia.  On resource supplement tid.   * T7 fracture, paraplegia. Cause of fracture felt to be infectious , weaning Decadron.   * A  fib, DVT bil LE 10/25 (DVT despite Eliquis). Eliquis, Plavix on hold due to GI bleed.   * Right lung mass.  10/18 Ct guided lung biopsy: Inflammatory process.      PLAN   *  ? EGD repeat, timing per Dr Fuller Plan.   *  Switch from PPI drip to BID po.  CBC in AM   Azucena Freed  12/31/2015, 8:41 AM Pager: [phone]     Attending physician's note   I have taken an interval history, reviewed the chart and examined the patient. I agree with the Advanced Practitioner's note, impression and recommendations. Advised patient to proceed with repeat EGD tomorrow at 1100 given limited duodenal visualization and question regarding safety/timing of restarting anticoagulation. Pt agreeable but reluctant and wants his wife to have input into EGD decision.   Lucio Edward, MD [facility] [phone] Mon-Fri 8a-5p [phone] after 5p, weekends, holidays

## 2015-12-31 NOTE — Progress Notes (Signed)
PT Cancellation Note  Patient Details Name: Marcus [name] MRN: [id number] DOB: [birth year]   Cancelled Treatment:    Reason Eval/Treat Not Completed: Fatigue/lethargy limiting ability to participate; patient reports thought he was "going to have the day off today."  States just found comfortable spot and doesn't want to move today.  Encouraged participation since when he goes to CIR will have therapy every day.  Patient tearful and seeking some control of his condition.  Will attempt to see tomorrow.   Reginia Naas 12/31/2015, 1:12 PM  Magda Kiel, [place] 12/31/2015

## 2015-12-31 NOTE — Progress Notes (Signed)
SLP Cancellation Note  Patient Details Name: Marcus [name] MRN: [id number] DOB: 1940/06/02   Cancelled treatment:       Reason Eval/Treat Not Completed: Patient declined stating he is having no difficulty swallowing. MD present asked SLP to hold off. Please re-order if needed.  Ezekiel Slocumb, Student SLP  Shela Leff 12/31/2015, 9:43 AM

## 2015-12-31 NOTE — Progress Notes (Signed)
PROGRESS NOTE    Marcus [name]  [id number] DOB: 1940-11-06 DOA: 12/25/2015 PCP: Jeanmarie Hubert, MD  Brief Narrative:  Marcus [name] is a 75 y.o. 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 Patient's current complaints include intermittent pulling or burning sensation of his epigastric and right upper and left upper quadrants. Sometimes describes the pain as a rope pulling him tight sensation. Worse with certain movements and deep breathing. Sometimes comes on as a crampy sensation that happens spontaneously. Pain typically lasts a few seconds. Per review of patient's recent care on the third floor and discussion of staff there are no reported dark or loose bloody stools prior to being transferred to CIR. Staff on CIR note dark to black stools for the last 24 hours. Denies any other new symptoms such as fevers, cough, shortness of breath, rash, palpitations, headache, neck stiffness. Symptoms appear to be getting worse. EGD done today and showed one non-bleeding ulcer with a no nonbleeding visible vessel as well as 2 other ulcers in the duodenal sweep and second portion but bleeding was coming from an area that was not visualized with the Endoscope. IR was consulted for Angiogram for possible embolization and General Surgery was also consulted as backup. Patient was transfused another unit of blood yesterday and Hb/Hct has been stable today and will repeat in Pm. Will likely go for Repeat EGD tomorrow.   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 Normocytic. Likely secondary to upper GI bleed in the Duodenem from initiation of Eliquis.  -Ulceration given recent acute stress, anticoagulation and and high dose steroids. Will attempt to wean Steroids. Now down to Decadron 4 mg q12h -2 units PRBC per CIR team and 1 unit per GI team prior to Endoscopy -Hold Plavix and Eliquis  -Hb/Hct went from 8.2/24.3 ->8.1/24.0  -s/p 4 units total transfused -Gastroenterology to repeat EGD 01/01/2016.  -Per Surgery Continue Supportive Care and Medical Management with IV PPI, Holding Anticoagulation; Surgical Intervention not indicated at this time and Surgery Signed off the case for now.   -Repeat CBC in Am; NPO at midnight  GI Bleed 2/2 to Duodenal Ulcer Bleed:  - EGD Done and showed  non-bleeding ulcer with a no nonbleeding visible vessel as well as 2 other ulcers in the duodenal sweep and second portion but bleeding was coming from an area that was not visualized with the Endoscope. **REPEAT EGD IN AM - IR was consulted for Angiogram for possible embolization however Negative three-vessel mesenteric arteriogram. No evidence of active extravasation or other focal lesion to suggest etiology of GI bleed.  -General Surgery was also consulted as backup incase Embolization could not be done.  - Stopped Plavix and Eliquis; GI Recommends Holding Oral Anticoagulant for now.  - IV Protonix D/C'd as patient is on Protonix 40 mg po  BID -GI Dr. Loletha Carrow feels like submucosal epinephrine temporarily slowed or stopped the bleeding, or perhaps the bleeding is below the level of detection on angiogram because it is mucosal bleeding. 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 The patient underwent CT-guided core biopsy of right lower lobe/posterior mediastinal mass lesion 12/18/15. No evidence of malignancy of final pathology report. Likely Inflammation and Fibrosis -Blood cultures done on admission positive for MSSA, with surveillance blood cultures negative so far. Antibiotics switched to vancomycin 12/22/15 after sputum cultures grew MRSA. PICC line Placed 12/21/15.  -TEE done 12/20/15, negative for endocarditis.  -CIR admitted pt on 10/24 but then DC on 10/25 due to GI bleed - see above.   - 2weeks of vancomycin and a switch of his antibiotics back to Cefazolin to complete a 6 week course of antibiotics - Per ID recs.  - Vanc 10/21 --> -Continue Decadron for myelopathy from compression per Neursurgical recs. - Weaning 10 mg q6h to 8 mg q12h now as patient has concern for GI Bleed - Repeat MRI showed Similar appearance of abnormal signal intensity involving the T6 and T7 vertebral bodies with associated T7 compression fracture, with direct extension into the right lower lobe pulmonary process.Overall, inflammatory changes within the right lower lobe areimproved, with decreased size of right lower lobe lesion as compared to prior MRI from 12/16/2015. Given the interval improvement, changes suggest an improving infectious process. T7 compression fracture with 50% height loss and 4 mm bony retropulsion is stable. 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 Overall improvement in previously seen spinal canal stenosis,a lthough there  remains persistent moderate to severe stenosis at the level of T7. 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 Layering left pleural effusion. -Pain control with IV Fentanyl and started Norco 5/325 mg q6hprn for Pain -PT/OT recommend transfer back to CIR once stable.   Leukocytosis: -WBC went from 9.3 -> 12.2 -> 11.3 -> 14.3 -> 13.1 -Likely reactive from stress to yesterday's EGD and IR procedure; Possibly from Demarginalization from Long Term Steroid Use; Patient Afebrile and on Abx -Repeat CBC in AM  Urinary retention: -Foley in place. Likely from T7 compression fracture and Infection -Continue Foley. -Outpt f/u w/ Urology  Afib RVR/CAD:  -Currently rate controlled. And in NSR today -Cards, Dr. Tamala Julian, Consulted on day of re-admit due to significant cardiac history on ASA and plavix w/ GI bleed.  - F/u Cards recs regarding anticoagulation - currently recommending restarting Eliquis as monotherapy after GI bleed resolution.  - Continue with Telemetry - Continue Diltiazem 180 mg daily - GI recommends Against Anticoagulation with NOAC for now, not until the bleeding sources have had time to heal  Bilat LE DVT:  -LE duplex on 10/25 noted DVT in the right soleal veins and the left peroneal veins. Likely from prolonged hospitalization and being bedbound -Hold anticoagulation at this time due to GI bleed -no SCD -Discussed with IR Dr. Anselm Pancoast  12/27/15 and he recommended waiting on full GI evaluation prior to IVC Filter placement.  -  GI states IVC filter should be considered given his recent DVT -Will Pursue IVC Filter after GI Bleeding resolves and not able to be placed on Anticoagulation  Pain: secondary to T7 compression fracture, infection, GI bleed, and prolonged hospital stay being bed bound. -Fentanyl PRN; Increased the dose yesterday.  -Norco 5/325 mg -Intranasal  calcitonin -Tylenol  Anxiety/Insomnia: -Continue Ativan -Trazodone QHS prn  Severe Protein calorie malnutrition:  - Poor appetite and oral intake since admission.  - Poor prognostic indicator for ability to heal - Nutrition consult - Ensure TID between meals once taking PO  H/o TIA/CVA:  - No residual deficits. S/p carotid endarterectomy - DC plavix as above  Pressure Ulcer to the Sacrum; Stage 2 (poA) -Appreciate Wound Care Nurse Recc's -Evaluated by wound care nurse. Foam dressings ordered and Santyl for Sacrum wound -Pressure reduction mattress ordered by admitting team. -Air overlay mattress; C/w Turning  HLD:  -C/w Statin  DVT prophylaxis: None because of GI Bleed Risk and no SCD's because of DVT Code Status: Full Family Communication: Discussed plan of care patient. No family at bedside. Disposition Plan: Inpatient Rehab when stable  Consultants:   GI  Cardiology  Interventional Radiology  Infectious Diseases  General Surgery  Procedures: EGD; Mesenteric Angiogram; Repeat EGD in AM  Antimicrobials: Vanc and Cefazolin after completion of Vanc  Subjective: Patient was seen and examined today and yesterday he started coughing on his liquids so a Speech therapy eval was initiated but patient this AM refused swallow eval and stated he "just wanted to rest." Denied any complaints today and stated he felt fine and just wanted to rest. No CP/SOB/N/V or Lightheadedness. No dizziness. Repeat EGD in Am.  Objective: Vitals:   12/31/15 0721 12/31/15 1412 12/31/15 1500 12/31/15 1814  BP:      Pulse: 62 75  70  Resp: (!) 23 (!) 21  20  Temp:   97.7 F (36.5 C)   TempSrc:   Oral   SpO2: 99% 100%  100%  Weight:        Intake/Output Summary (Last 24 hours) at 12/31/15 1948 Last data filed at 12/31/15 1500  Gross per 24 hour  Intake            87.33 ml  Output                0 ml  Net            87.33 ml   Filed Weights   12/29/15 0712 12/30/15 0642  12/31/15 0500  Weight: 94.4 kg (208 lb 3.2 oz) 99.7 kg (219 lb 12.8 oz) 100.7 kg (222 lb 0.1 oz)   Examination: Physical Exam:  Constitutional: Chronic Ill appearing 75 year old male who appears calm today Eyes: Lids and conjunctivae normal, sclerae anicteric  ENMT: External Ears, Nose appear normal. Grossly normal hearing.  Neck: Appears normal, supple, no cervical masses, normal ROM, no appreciable thyromegaly, no JVD Respiratory: Diminished bilaterally but no wheezing or rhonchi. No appreciable crackles. Normal respiratory effort with no tachypenia. No accessory muscle use.  Cardiovascular: RRR, no murmurs / rubs / gallops. S1 and S2 auscultated. 2+ Lower extremity edema. Abdomen: Soft, non-tender, non-distended. No masses palpated. No appreciable hepatosplenomegaly. Bowel sounds positive.  GU: Deferred. Musculoskeletal: No clubbing / cyanosis of digits/nails. No joint deformity upper and lower extremities. No contractures. Decreased strength and muscle tone in LE still with 0/5 in Strength. States he feels when you touch "sometimes."    Skin: No rashes, lesions. No  induration; Warm and dry. Stage 2 sacral decubitus ulcer.  Neurologic: CN 2-12 grossly intact with no focal deficits. Sensation decreased especially from Right Leg to Right Nipple line and Left Leg up until Thigh, Romberg sign cerebellar reflexes not assessed.  Psychiatric: Normal judgment and insight. Alert and oriented x 3. Depressed and flat affect.   Data Reviewed: I have personally reviewed following labs and imaging studies  CBC:  Recent Labs Lab 12/25/15 0511  12/27/15 0515 12/28/15 0828 12/28/15 1742 12/29/15 0805 12/29/15 2030 12/30/15 0456 12/31/15 0354  WBC 8.4  < > 9.3 12.2*  --  11.3*  --  14.3* 13.1*  NEUTROABS 8.1*  --  8.9*  --   --   --   --   --   --   HGB 7.7*  < > 7.6* 9.3* 8.2* 7.5* 8.5* 8.2* 8.1*  HCT 23.8*  < > 22.4* 27.6* 24.4* 22.3* 25.4* 24.3* 24.0*  MCV 93.7  < > 91.1 93.6  --  92.1  --   91.4 92.0  PLT 302  < > 175 145*  --  114*  --  94* 78*  < > = values in this interval not displayed. Basic Metabolic Panel:  Recent Labs Lab 12/27/15 0515 12/28/15 0510 12/29/15 0453 12/30/15 0456 12/31/15 0815  NA 141 141 139 138 138  K 4.6 4.5 4.4 4.2 4.3  CL 112* 113* 112* 110 110  CO2 23 23 23 24 24   GLUCOSE 131* 132* 127* 116* 114*  BUN 62* 49* 41* 31* 27*  CREATININE 0.90 0.81 0.67 0.69 0.63  CALCIUM 7.5* 7.5* 7.3* 7.2* 7.3*  MG 2.6* 2.6* 2.4 2.1  --   PHOS 3.6 3.6 2.8 2.9  --    GFR: Estimated Creatinine Clearance: 94.3 mL/min (by C-G formula based on SCr of 0.63 mg/dL). Liver Function Tests:  Recent Labs Lab 12/27/15 0515 12/28/15 0510 12/29/15 0453 12/30/15 0456 12/31/15 0815  AST 27 28 27 26  32  ALT 26 27 23 21 26   ALKPHOS 134* 65 56 53 59  BILITOT 0.7 0.8 0.8 0.8 0.5  PROT 3.8* 4.0* 3.8* 3.8* 3.7*  ALBUMIN 1.9* 1.9* 1.7* 1.6* 1.5*   No results for input(s): LIPASE, AMYLASE in the last 168 hours. No results for input(s): AMMONIA in the last 168 hours. Coagulation Profile:  Recent Labs Lab 12/29/15 2030  INR 1.16   Cardiac Enzymes: No results for input(s): CKTOTAL, CKMB, CKMBINDEX, TROPONINI in the last 168 hours. BNP (last 3 results) No results for input(s): PROBNP in the last 8760 hours. HbA1C: No results for input(s): HGBA1C in the last 72 hours. CBG:  Recent Labs Lab 12/30/15 1120 12/30/15 1628 12/30/15 2121 12/31/15 0739 12/31/15 1144  GLUCAP 141* 240* 120* 131* 97   Lipid Profile: No results for input(s): CHOL, HDL, LDLCALC, TRIG, CHOLHDL, LDLDIRECT in the last 72 hours. Thyroid Function Tests: No results for input(s): TSH, T4TOTAL, FREET4, T3FREE, THYROIDAB in the last 72 hours. Anemia Panel: No results for input(s): VITAMINB12, FOLATE, FERRITIN, TIBC, IRON, RETICCTPCT in the last 72 hours. Sepsis Labs: No results for input(s): PROCALCITON, LATICACIDVEN in the last 168 hours.  No results found for this or any previous visit  (from the past 240 hour(s)).  Radiology Studies: No results found.  Scheduled Meds: . sodium chloride   Intravenous Once  . calcitonin (salmon)  1 spray Alternating Nares Daily  . collagenase   Topical Daily  . dexamethasone  4 mg Intravenous Q12H  . diltiazem  180 mg Oral  Daily  . feeding supplement  1 Container Oral TID BM  . insulin aspart  0-5 Units Subcutaneous QHS  . insulin aspart  0-9 Units Subcutaneous TID WC  . ipratropium-albuterol  3 mL Nebulization TID  . LORazepam  0.5 mg Oral Daily  . nicotine polacrilex  2 mg Oral Q2H  . pantoprazole  40 mg Oral BID  . sodium chloride flush  3 mL Intravenous Q12H  . timolol  1 drop Both Eyes BID   Continuous Infusions: . sodium chloride 20 mL/hr at 12/31/15 1038  . lactated ringers Stopped (12/29/15 0700)    LOS: 6 days   Kerney Elbe, DO [facility] Pager [phone]  If 7PM-7AM, please contact night-coverage [email or web address] Password [email or web address] 12/31/2015, 7:48 PM

## 2015-12-31 NOTE — Progress Notes (Signed)
[facility] Progress Note  4 Days Post-Op  Subjective: Abdominal pain decreasing, rated as a 3/10 at rest and worse with movement and eating too much. Denies fever, chills, nausea and vomiting. Had one dark stool yesterday, no dark/black BMs reported today.   Hemoglobin is stable today - 8.1 from 8.2   Objective: Vital signs in last 24 hours: Temp:  [97.7 F (36.5 C)-98.3 F (36.8 C)] 97.9 F (36.6 C) (10/31 0348) Pulse Rate:  [57-73] 62 (10/31 0721) Resp:  [18-23] 23 (10/31 0721) BP: (115-119)/(63-73) 115/63 (10/31 0348) SpO2:  [93 %-100 %] 99 % (10/31 0721) Weight:  [222 lb 0.1 oz (100.7 kg)] 222 lb 0.1 oz (100.7 kg) (10/31 0500) Last BM Date: 12/29/15  Intake/Output from previous day: 10/30 0701 - 10/31 0700 In: -  Out: 600 [Urine:600] Intake/Output this shift: No intake/output data recorded.  PE: Gen:  Alert, NAD, pleasant and cooperative Card:  Irregularly irregular  Abd: Soft, mild upper abdominal/epigastric tenderness, no peritonitis   Lab Results:   Recent Labs  12/30/15 0456 12/31/15 0354  WBC 14.3* 13.1*  HGB 8.2* 8.1*  HCT 24.3* 24.0*  PLT 94* 78*   BMET  Recent Labs  12/30/15 0456 12/31/15 0815  NA 138 138  K 4.2 4.3  CL 110 110  CO2 24 24  GLUCOSE 116* 114*  BUN 31* 27*  CREATININE 0.69 0.63  CALCIUM 7.2* 7.3*   PT/INR  Recent Labs  12/29/15 2030  LABPROT 14.9  INR 1.16   CMP     Component Value Date/Time   NA 138 12/31/2015 0815   NA 139 06/21/2015 0829   K 4.3 12/31/2015 0815   CL 110 12/31/2015 0815   CO2 24 12/31/2015 0815   GLUCOSE 114 (H) 12/31/2015 0815   BUN 27 (H) 12/31/2015 0815   BUN 20 06/21/2015 0829   CREATININE 0.63 12/31/2015 0815   CREATININE 1.22 (H) 09/16/2015 0840   CALCIUM 7.3 (L) 12/31/2015 0815   PROT 3.7 (L) 12/31/2015 0815   PROT 6.6 06/21/2015 0829   ALBUMIN 1.5 (L) 12/31/2015 0815   ALBUMIN 3.9 06/21/2015 0829   AST 32 12/31/2015 0815   ALT 26 12/31/2015 0815   ALKPHOS 59  12/31/2015 0815   BILITOT 0.5 12/31/2015 0815   BILITOT 0.3 06/21/2015 0829   GFRNONAA >60 12/31/2015 0815   GFRNONAA 59 (L) 09/16/2015 0840   GFRAA >60 12/31/2015 0815   GFRAA 68 09/16/2015 0840   Lipase     Component Value Date/Time   LIPASE 19 12/01/2015 1820   Anti-infectives: Anti-infectives    Start     Dose/Rate Route Frequency Ordered Stop   12/25/15 1700  vancomycin (VANCOCIN) 1,250 mg in sodium chloride 0.9 % 250 mL IVPB  Status:  Discontinued     1,250 mg 166.7 mL/hr over 90 Minutes Intravenous Every 12 hours 12/25/15 1654 12/25/15 1931       Assessment/Plan Upper GI bleed, Melena Acute blood loss anemia - 10/25 2 units PRBC, Plavix and Eloquis held, PPI drip started - 10/27 1 unit PRBC, Upper Endoscopy, Dr. Loletha Carrow; one, deep non-bleeding ulcer of anterior duodenal bulb, blood in duodenum with source unclear - not able to treat.  - 10/27 IR performed mesenteric angiogram - no bleeding seen.  - 10/29 1u PRBC, Hgb this morning 8.2 (8.5 last night, 7.5 before transfusion yesterday) - hgb stable this morning: 8.1, 8.2 yesterday   FEN: Heart healthy  GI proph: protonix drip VTE: anticoag held due to GIB, SCD's held  due to BL LE DVT. Consider IVC filter.   Plan: resolving UGI bleed. Pain improving, hgb stable, melena decreasing, VSS. Continue medical management. General surgery will sign off for now but will be available as needed should bleeding recur.    LOS: 6 days    [facility] 12/31/2015, 9:03 AM Pager: [phone] Consults: [phone] Mon-Fri 7:00 am-4:30 pm Sat-Sun 7:00 am-11:30 am

## 2016-01-01 ENCOUNTER — Ambulatory Visit: Payer: Medicare Other

## 2016-01-01 ENCOUNTER — Encounter (HOSPITAL_COMMUNITY)
Admission: AD | Disposition: A | Discharge status: Skilled Nursing Facility | Payer: Self-pay | Source: Ambulatory Visit | Attending: Family Medicine

## 2016-01-01 ENCOUNTER — Inpatient Hospital Stay (HOSPITAL_COMMUNITY): Payer: Medicare Other | Admitting: Certified Registered Nurse Anesthetist

## 2016-01-01 ENCOUNTER — Encounter (HOSPITAL_COMMUNITY): Payer: Self-pay | Admitting: *Deleted

## 2016-01-01 HISTORY — PX: ESOPHAGOGASTRODUODENOSCOPY: SHX5428

## 2016-01-01 LAB — CBC
HCT: 24 % — ABNORMAL LOW (ref 39.0–52.0)
HEMOGLOBIN: 8 g/dL — AB (ref 13.0–17.0)
MCH: 31.1 pg (ref 26.0–34.0)
MCHC: 33.3 g/dL (ref 30.0–36.0)
MCV: 93.4 fL (ref 78.0–100.0)
Platelets: 71 10*3/uL — ABNORMAL LOW (ref 150–400)
RBC: 2.57 MIL/uL — AB (ref 4.22–5.81)
RDW: 17.8 % — ABNORMAL HIGH (ref 11.5–15.5)
WBC: 10.7 10*3/uL — ABNORMAL HIGH (ref 4.0–10.5)

## 2016-01-01 LAB — GLUCOSE, CAPILLARY
Glucose-Capillary: 142 mg/dL — ABNORMAL HIGH (ref 65–99)
Glucose-Capillary: 115 mg/dL — ABNORMAL HIGH (ref 65–99)
Glucose-Capillary: 93 mg/dL (ref 65–99)
Glucose-Capillary: 79 mg/dL (ref 65–99)
Glucose-Capillary: 112 mg/dL — ABNORMAL HIGH (ref 65–99)

## 2016-01-01 SURGERY — EGD (ESOPHAGOGASTRODUODENOSCOPY)
Anesthesia: Monitor Anesthesia Care

## 2016-01-01 MED ORDER — PREGABALIN 75 MG PO CAPS
75.0000 mg | ORAL_CAPSULE | Freq: Two times a day (BID) | ORAL | Status: DC
  Administered 2016-01-01: 75 mg via ORAL
  Filled 2016-01-01 (×2): qty 1

## 2016-01-01 MED ORDER — PROPOFOL 10 MG/ML IV BOLUS
INTRAVENOUS | Status: DC | PRN
  Administered 2016-01-01 (×2): 20 mg via INTRAVENOUS

## 2016-01-01 MED ORDER — VANCOMYCIN HCL 10 G IV SOLR
1250.0000 mg | Freq: Two times a day (BID) | INTRAVENOUS | Status: DC
  Administered 2016-01-01 – 2016-01-04 (×7): 1250 mg via INTRAVENOUS
  Filled 2016-01-01 (×9): qty 1250

## 2016-01-01 MED ORDER — SODIUM CHLORIDE 0.9 % IV SOLN
INTRAVENOUS | Status: DC | PRN
  Administered 2016-01-01: 10:00:00 via INTRAVENOUS

## 2016-01-01 MED ORDER — PROPOFOL 500 MG/50ML IV EMUL
INTRAVENOUS | Status: DC | PRN
  Administered 2016-01-01: 75 ug/kg/min via INTRAVENOUS

## 2016-01-01 MED ORDER — FLUCONAZOLE 100 MG PO TABS
100.0000 mg | ORAL_TABLET | Freq: Every day | ORAL | Status: DC
  Administered 2016-01-01: 100 mg via ORAL
  Filled 2016-01-01 (×2): qty 1

## 2016-01-01 NOTE — Progress Notes (Signed)
Pharmacy Antibiotic Note  Marcus [name] is a 75 y.o. male admitted on 12/25/2015 with discitis, MSSA abscess/bacteremia, diagnosed 12/14/15. Antibiotics were switched to vancomycin on 10/22 with sputum cx growing MRSA. Plan per ID was to continue vancomycin x 2 weeks (originally ending 11/5) and switch to cefazolin to complete a 6-wk course (originally to be completed 01/29/16) - but has not had vancomycin since 10/25 for unclear reasons. TEE on 10/22 negative for endocarditis. Pharmacy has been consulted for vancomycin dosing. Patient was therapeutic on 1250mg  IV q12h on 10/24 prior to inadvertent d/c. Renal function stable, good UOP. Afebrile, wbc down to 10.7. Noted paraplegia.  Plan: Resume vancomycin 1250mg  IV q12h Monitor clinical progress, c/s, renal function VT@SS  F/u ID plans    Weight: 223 lb 6.4 oz (101.3 kg)  Temp (24hrs), Avg:98 F (36.7 C), Min:97.8 F (36.6 C), Max:98.4 F (36.9 C)   Recent Labs Lab 12/27/15 0515 12/28/15 0510 12/28/15 [id number] 12/29/15 0453 12/29/15 0805 12/30/15 0456 12/31/15 0354 12/31/15 0815 01/01/16 0403  WBC 9.3  --  12.2*  --  11.3* 14.3* 13.1*  --  10.7*  CREATININE 0.90 0.81  --  0.67  --  0.69  --  0.63  --     Estimated Creatinine Clearance: 94.7 mL/min (by C-G formula based on SCr of 0.63 mg/dL).    Allergies  Allergen Reactions  . Codeine Other (See Comments)    constipation  . Demerol [Meperidine] Other (See Comments)    Drops blood pressure   . Contrast Media [Iodinated Diagnostic Agents] Nausea And Vomiting  . Gabapentin Other (See Comments)    Suicidal, depression  . Other Other (See Comments)    All narcotics make pt hallucinate    Antimicrobials this admission:  10/14 CTX >> 10/15 10/14 Azithromycin >> 10/15 10/15 Cefazolin >> 10/22 10/22 Vancomycin >>off 10/25 per MD; 11/1>>  Dose adjustments this admission:  10/24 VT = 16 - con't vanc 1250 q 12 hrs.  Microbiology results:  10/14 BCx: BCID +MSSA 10/14  UCx: mssa  10/15 MRSA PCR: neg 10/17 BCx x 2: Neg 10/18 Sputum Cx: MRSA  10/18 Mediastinal mass cx> MSSA   Elicia Lamp, PharmD, BCPS Clinical Pharmacist 01/01/2016 9:46 PM

## 2016-01-01 NOTE — Anesthesia Preprocedure Evaluation (Signed)
Anesthesia Evaluation  Patient identified by MRN, date of birth, ID band Patient awake    Reviewed: Allergy & Precautions, NPO status , Patient's Chart, lab work & pertinent test results  Airway Mallampati: I  TM Distance: >3 FB Neck ROM: Full    Dental   Pulmonary former smoker,    Pulmonary exam normal        Cardiovascular hypertension, Normal cardiovascular exam     Neuro/Psych Anxiety CVA    GI/Hepatic   Endo/Other  diabetes, Type 2, Oral Hypoglycemic Agents  Renal/GU      Musculoskeletal   Abdominal   Peds  Hematology   Anesthesia Other Findings   Reproductive/Obstetrics                             Anesthesia Physical Anesthesia Plan  ASA: III  Anesthesia Plan: MAC   Post-op Pain Management:    Induction: Intravenous  Airway Management Planned: Simple Face Mask  Additional Equipment:   Intra-op Plan:   Post-operative Plan:   Informed Consent: I have reviewed the patients History and Physical, chart, labs and discussed the procedure including the risks, benefits and alternatives for the proposed anesthesia with the patient or authorized representative who has indicated his/her understanding and acceptance.     Plan Discussed with: CRNA and Surgeon  Anesthesia Plan Comments:         Anesthesia Quick Evaluation

## 2016-01-01 NOTE — Progress Notes (Signed)
OT Cancellation Note  Patient Details Name: Marcus [name] MRN: [id number] DOB: 1940/12/31   Cancelled Treatment:    Reason Eval/Treat Not Completed: Fatigue/lethargy limiting ability to participate. Will check re attempt tomorrow  [name] 01/01/2016, 12:35 PM

## 2016-01-01 NOTE — Anesthesia Postprocedure Evaluation (Signed)
Anesthesia Post Note  Patient: DALYN BURCZYK  Procedure(s) Performed: Procedure(s) (LRB): ESOPHAGOGASTRODUODENOSCOPY (EGD) (N/A)  Patient location during evaluation: PACU Anesthesia Type: MAC Level of consciousness: awake and alert Pain management: pain level controlled Vital Signs Assessment: post-procedure vital signs reviewed and stable Respiratory status: spontaneous breathing, nonlabored ventilation, respiratory function stable and patient connected to nasal cannula oxygen Cardiovascular status: stable and blood pressure returned to baseline Anesthetic complications: no    Last Vitals:  Vitals:   01/01/16 1125 01/01/16 1150  BP: (!) 154/75 135/76  Pulse: 61 61  Resp: 15 18  Temp:  36.7 C    Last Pain:  Vitals:   01/01/16 1150  TempSrc: Oral  PainSc:                  Fergie Sherbert DAVID

## 2016-01-01 NOTE — Progress Notes (Signed)
PROGRESS NOTE    Marcus [name]  [id number] DOB: 04-17-1940 DOA: 12/25/2015 PCP: Jeanmarie Hubert, MD   Brief Narrative: Marcus [name] is a 75 y.o. 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   Assessment & Plan:   Active Problems:   Essential hypertension   Cerebrovascular accident (CVA) due to stenosis of left carotid artery (HCC)   Atrial fibrillation with RVR (HCC)   Urinary retention   Lower extremity weakness   T7 vertebral fracture (HCC)   Pressure injury of skin   MRSA infection   MSSA (methicillin susceptible Staphylococcus aureus) septicemia (HCC)   Paraplegia (HCC)   Acute blood loss anemia   Melena   Gastrointestinal hemorrhage associated with duodenal ulcer   Myopathy   Other abnormalities of gait and mobility   Leukocytosis   Symptomatic anemia Likely secondary to GI bleed secondary upper GI pathology. Suspect steroids have maintained a persistent ulcer which, with the addition of Eliquis, has been bleeding. S/p 4u PRBC -continue to hold plavix and Eliquis -daily CBC -Gastroenterology recommendations  GI bleed Initial EGD significant for non-bleeding ulcers. Embolization was not performed by IR secondary to negative mesenteric arteriogram. -plavix stopped -continue protonix -continue to wean decadron in setting of significant GI ulceration -monitor CBC  Paraplegia secondary to T7 compression fracture Discitis Abscess secondary to MSSA with bacteremia Patient started on antibiotics prior to admission. Patient has not had vancomycin since 10/25. Unsure of what happened. -continue 2 weeks of vancomycin with switch to cafazolin to complete 6 weeks of antibiotics per ID recommendations. Restart vancomycin -continue fentanyl and Norco prn -will add Lyrica 75mg  bid  Leukocytosis Likely reactive secondary to stress from recent procedures. Patient  afebrile  Urinary retention -continue foley -outpatient follow-up with Urology  Atrial fibrillation with RVR Currently rate controlled. -cardiology recommendations -continue diltiazem -anticoagulation needs to be figured out by discharge  Bilateral LE DVT Consideration for IVC filter. Discussed with IR. 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 No chest pain or dyspnea.  Objective: Vitals:   01/01/16 1125 01/01/16 1150 01/01/16 1357 01/01/16 1610  BP: (!) 154/75 135/76  126/77  Pulse: 61 61  62  Resp: 15 18  18   Temp:  98.1 F (36.7 C)  97.8 F (36.6 C)  TempSrc:  Oral  Axillary  SpO2: 97% 97% 98% 98%  Weight:        Intake/Output Summary (Last 24 hours) at 01/01/16 1643 Last data filed at 01/01/16 1403  Gross per 24 hour  Intake              640 ml  Output             1400 ml  Net             -760 ml  Filed Weights   12/30/15 0642 12/31/15 0500 01/01/16 0623  Weight: 99.7 kg (219 lb 12.8 oz) 100.7 kg (222 lb 0.1 oz) 101.3 kg (223 lb 6.4 oz)    Examination:  General exam: Appears calm and comfortable  Respiratory system: Clear and diminished to auscultation. Respiratory effort normal. Cardiovascular system: S1 & S2  heard, RRR. No murmurs, rubs, gallops or clicks. Gastrointestinal system: Abdomen is nondistended, soft and nontender. Normal bowel sounds heard. Central nervous system: Alert and oriented. CN intact. No sensation of feet and ankles bilaterally with 0/5 LE strength Extremities: No calf tenderness Skin: No cyanosis. No rashes Psychiatry: Judgement and insight appear normal. Mood & affect appropriate.     Data Reviewed: I have personally reviewed following labs and imaging studies  CBC:  Recent Labs Lab 12/27/15 0515 12/28/15 0828  12/29/15 0805 12/29/15 2030 12/30/15 0456 12/31/15 0354 01/01/16 0403  WBC 9.3 12.2*  --  11.3*  --  14.3* 13.1* 10.7*  NEUTROABS 8.9*  --   --   --   --   --   --   --   HGB 7.6* 9.3*  < > 7.5* 8.5* 8.2* 8.1* 8.0*  HCT 22.4* 27.6*  < > 22.3* 25.4* 24.3* 24.0* 24.0*  MCV 91.1 93.6  --  92.1  --  91.4 92.0 93.4  PLT 175 145*  --  114*  --  94* 78* 71*  < > = values in this interval not displayed. Basic Metabolic Panel:  Recent Labs Lab 12/27/15 0515 12/28/15 0510 12/29/15 0453 12/30/15 0456 12/31/15 0815  NA 141 141 139 138 138  K 4.6 4.5 4.4 4.2 4.3  CL 112* 113* 112* 110 110  CO2 23 23 23 24 24   GLUCOSE 131* 132* 127* 116* 114*  BUN 62* 49* 41* 31* 27*  CREATININE 0.90 0.81 0.67 0.69 0.63  CALCIUM 7.5* 7.5* 7.3* 7.2* 7.3*  MG 2.6* 2.6* 2.4 2.1  --   PHOS 3.6 3.6 2.8 2.9  --    GFR: Estimated Creatinine Clearance: 94.7 mL/min (by C-G formula based on SCr of 0.63 mg/dL). Liver Function Tests:  Recent Labs Lab 12/27/15 0515 12/28/15 0510 12/29/15 0453 12/30/15 0456 12/31/15 0815  AST 27 28 27 26  32  ALT 26 27 23 21 26   ALKPHOS 134* 65 56 53 59  BILITOT 0.7 0.8 0.8 0.8 0.5  PROT 3.8* 4.0* 3.8* 3.8* 3.7*  ALBUMIN 1.9* 1.9* 1.7* 1.6* 1.5*   No results for input(s): LIPASE, AMYLASE in the last 168 hours. No results for input(s): AMMONIA in the last 168 hours. Coagulation Profile:  Recent Labs Lab 12/29/15 2030  INR 1.16    Cardiac Enzymes: No results for input(s): CKTOTAL, CKMB, CKMBINDEX, TROPONINI in the last 168 hours. BNP (last 3 results) No results for input(s): PROBNP in the last 8760 hours. HbA1C: No results for input(s): HGBA1C in the last 72 hours. CBG:  Recent Labs Lab 12/31/15 1643 12/31/15 2040 01/01/16 0729 01/01/16 1150 01/01/16 1611  GLUCAP 142* 135* 115* 93 79   Lipid Profile: No results for input(s): CHOL, HDL, LDLCALC, TRIG, CHOLHDL, LDLDIRECT in the last 72 hours. Thyroid Function Tests: No results for input(s): TSH, T4TOTAL, FREET4, T3FREE, THYROIDAB in the last 72 hours. Anemia Panel: No results for input(s): VITAMINB12, FOLATE, FERRITIN, TIBC, IRON, RETICCTPCT in the last 72 hours. Sepsis Labs: No results for input(s): PROCALCITON, LATICACIDVEN in the last 168 hours.  No results found for this or any previous visit (from the past 240 hour(s)).  Radiology Studies: No results found.      Scheduled Meds: . sodium chloride   Intravenous Once  . calcitonin (salmon)  1 spray Alternating Nares Daily  . collagenase   Topical Daily  . dexamethasone  4 mg Intravenous Q12H  . diltiazem  180 mg Oral Daily  . feeding supplement  1 Container Oral TID BM  . fluconazole  100 mg Oral Daily  . insulin aspart  0-5 Units Subcutaneous QHS  . insulin aspart  0-9 Units Subcutaneous TID WC  . ipratropium-albuterol  3 mL Nebulization TID  . LORazepam  0.5 mg Oral Daily  . nicotine polacrilex  2 mg Oral Q2H  . pantoprazole  40 mg Oral BID  . pregabalin  75 mg Oral BID  . sodium chloride flush  3 mL Intravenous Q12H  . timolol  1 drop Both Eyes BID   Continuous Infusions: . lactated ringers Stopped (12/29/15 0700)     LOS: 7 days     Cordelia Poche [facility] 01/01/2016, 4:43 PM Pager: [phone]  If 7PM-7AM, please contact night-coverage [email or web address] Password [facility] 01/01/2016, 4:43 PM

## 2016-01-01 NOTE — Op Note (Signed)
[facility] Patient Name: Marcus [name] Procedure Date : 01/01/2016 MRN: [id number] Attending MD: Ladene Artist , MD Date of Birth: [birth year] CSN: [id number] Age: 75 Admit Type: Inpatient Procedure:                Upper GI endoscopy Indications:              Melena, Duodenal ulcer with bleed Providers:                Pricilla Riffle. Fuller Plan, MD, Cleda Daub, RN, William Dalton, Technician, Shelton Silvas, CRNA Referring MD:             [facility] Medicines:                Monitored Anesthesia Care Complications:            No immediate complications. Estimated Blood Loss:     Estimated blood loss: none. Procedure:                Pre-Anesthesia Assessment:                           - Prior to the procedure, a History and Physical                            was performed, and patient medications and                            allergies were reviewed. The patient's tolerance of                            previous anesthesia was also reviewed. The risks                            and benefits of the procedure and the sedation                            options and risks were discussed with the patient.                            All questions were answered, and informed consent                            was obtained. Prior Anticoagulants: The patient has                            taken Eliquis (apixaban) and Plavix, last doses                            were 7 days prior to procedure. ASA Grade                            Assessment: III - A patient with severe systemic  disease. After reviewing the risks and benefits,                            the patient was deemed in satisfactory condition to                            undergo the procedure.                           After obtaining informed consent, the endoscope was                            passed under direct vision. Throughout the     procedure, the patient's blood pressure, pulse, and                            oxygen saturations were monitored continuously. The                            [id number]) scope was introduced through the                            mouth, and advanced to the second part of duodenum.                            The upper GI endoscopy was accomplished without                            difficulty. The patient tolerated the procedure                            well. Scope In: Scope Out: Findings:      Diffuse candidiasis was found in the entire esophagus.      The exam of the esophagus was otherwise normal.      The entire examined stomach was normal.      One non-bleeding cratered duodenal ulcer with a flat pigmented spot       (Forrest Class IIc) was found in the duodenal bulb. The lesion was 7 mm       in largest dimension.      Two non-bleeding cratered and linear duodenal ulcers with no stigmata of       bleeding were found in the second portion of the duodenum. The largest       lesion was 5 mm in largest dimension. The cratered ulcer was located on       an edematous fold and was likely bleeding at prior EGD at obscured by       blood. Impression:               - Monilial esophagitis.                           - Normal stomach.                           - One non-bleeding duodenal ulcer with a flat  pigmented spot (Forrest Class IIc).                           - Multiple non-bleeding duodenal ulcers with no                            stigmata of bleeding.                           - No specimens collected. Moderate Sedation:      none Recommendation:           - Return patient to hospital ward for ongoing care.                           - Diflucan 100 mg for 10 days                           - No aspirin, ibuprofen, naproxen, or other                            non-steroidal anti-inflammatory drugs long term                            except EC  ASA 81 mg which is ok to resume in 2 days                            if clinically indicated.                           - PPI bid for 1 month and then PPI daily long term                           - At higher risk for rebleeding with Eliquis and                            Plavix however could restart in 2 days if                            clinically indicated and very closely observe for                            rebleed                           - Resume previous diet.                           - Outpatient GI follow up with Dr. Wilfrid Lund as                            needed. GI signing off. Procedure Code(s):        --- Professional ---  [id number], Esophagogastroduodenoscopy, flexible,                            transoral; diagnostic, including collection of                            specimen(s) by brushing or washing, when performed                            (separate procedure) Diagnosis Code(s):        --- Professional ---                           B37.81, Candidal esophagitis                           K26.9, Duodenal ulcer, unspecified as acute or                            chronic, without hemorrhage or perforation                           K92.1, Melena (includes Hematochezia) CPT copyright 2016 [employer]. All rights reserved. The codes documented in this report are preliminary and upon coder review may  be revised to meet current compliance requirements. Ladene Artist, MD 01/01/2016 11:27:37 AM This report has been signed electronically. Number of Addenda: 0

## 2016-01-01 NOTE — Anesthesia Procedure Notes (Signed)
Procedure Name: MAC Performed by: Candis Shine Pre-anesthesia Checklist: Patient identified, Emergency Drugs available, Suction available, Patient being monitored and Timeout performed Patient Re-evaluated:Patient Re-evaluated prior to inductionOxygen Delivery Method: Nasal cannula Intubation Type: IV induction Placement Confirmation: positive ETCO2 Dental Injury: Teeth and Oropharynx as per pre-operative assessment

## 2016-01-01 NOTE — Progress Notes (Signed)
OT Cancellation Note  Patient Details Name: Marcus [name] MRN: [id number] DOB: 1941/02/24   Cancelled Treatment:    Reason Eval/Treat Not Completed: Patient at procedure or test/ unavailable. Will check back later as able  [name] 01/01/2016, 10:20 AM

## 2016-01-01 NOTE — Progress Notes (Addendum)
PT Cancellation Note  Patient Details Name: Marcus [name] MRN: [id number] DOB: November 08, 1940   Cancelled Treatment:    Reason Eval/Treat Not Completed: Patient at procedure or test/unavailable, pt preparing to go down to procedure, will check back in PM as time allows.    Taylor, [place] 01/01/2016, 9:36 AM

## 2016-01-01 NOTE — Transfer of Care (Signed)
Immediate Anesthesia Transfer of Care Note  Patient: Marcus [name]  Procedure(s) Performed: Procedure(s): ESOPHAGOGASTRODUODENOSCOPY (EGD) (N/A)  Patient Location: Endoscopy Unit  Anesthesia Type:MAC  Level of Consciousness: awake, alert  and oriented  Airway & Oxygen Therapy: Patient Spontanous Breathing and Patient connected to nasal cannula oxygen  Post-op Assessment: Report given to RN and Post -op Vital signs reviewed and stable  Post vital signs: Reviewed  Last Vitals:  Vitals:   01/01/16 0730 01/01/16 1003  BP: 113/67 126/62  Pulse: 67 (!) 57  Resp: 17 15  Temp: 36.6 C 36.6 C    Last Pain:  Vitals:   01/01/16 1003  TempSrc: Oral  PainSc:       Patients Stated Pain Goal: 0 ([id number])  Complications: No apparent anesthesia complications

## 2016-01-02 ENCOUNTER — Ambulatory Visit: Payer: Medicare Other

## 2016-01-02 ENCOUNTER — Encounter (HOSPITAL_COMMUNITY): Payer: Self-pay | Admitting: Gastroenterology

## 2016-01-02 DIAGNOSIS — R52 Pain, unspecified: Secondary | ICD-10-CM | POA: Diagnosis present

## 2016-01-02 DIAGNOSIS — Z79899 Other long term (current) drug therapy: Secondary | ICD-10-CM

## 2016-01-02 DIAGNOSIS — F05 Delirium due to known physiological condition: Secondary | ICD-10-CM | POA: Diagnosis present

## 2016-01-02 DIAGNOSIS — A4902 Methicillin resistant Staphylococcus aureus infection, unspecified site: Secondary | ICD-10-CM

## 2016-01-02 DIAGNOSIS — Z833 Family history of diabetes mellitus: Secondary | ICD-10-CM

## 2016-01-02 DIAGNOSIS — Z87891 Personal history of nicotine dependence: Secondary | ICD-10-CM

## 2016-01-02 DIAGNOSIS — Z823 Family history of stroke: Secondary | ICD-10-CM

## 2016-01-02 DIAGNOSIS — Z808 Family history of malignant neoplasm of other organs or systems: Secondary | ICD-10-CM

## 2016-01-02 DIAGNOSIS — Z8249 Family history of ischemic heart disease and other diseases of the circulatory system: Secondary | ICD-10-CM

## 2016-01-02 DIAGNOSIS — Z888 Allergy status to other drugs, medicaments and biological substances status: Secondary | ICD-10-CM

## 2016-01-02 LAB — CBC
HEMATOCRIT: 23.7 % — AB (ref 39.0–52.0)
HEMOGLOBIN: 7.8 g/dL — AB (ref 13.0–17.0)
MCH: 30.8 pg (ref 26.0–34.0)
MCHC: 32.9 g/dL (ref 30.0–36.0)
MCV: 93.7 fL (ref 78.0–100.0)
Platelets: 69 10*3/uL — ABNORMAL LOW (ref 150–400)
RBC: 2.53 MIL/uL — ABNORMAL LOW (ref 4.22–5.81)
RDW: 17.6 % — AB (ref 11.5–15.5)
WBC: 6.7 10*3/uL (ref 4.0–10.5)

## 2016-01-02 LAB — BASIC METABOLIC PANEL
Anion gap: 4 — ABNORMAL LOW (ref 5–15)
BUN: 21 mg/dL — ABNORMAL HIGH (ref 6–20)
CALCIUM: 7.1 mg/dL — AB (ref 8.9–10.3)
CHLORIDE: 109 mmol/L (ref 101–111)
CO2: 25 mmol/L (ref 22–32)
CREATININE: 0.51 mg/dL — AB (ref 0.61–1.24)
GFR calc non Af Amer: >60 mL/min (ref >60)
GLUCOSE: 111 mg/dL — AB (ref 65–99)
Potassium: 4.2 mmol/L (ref 3.5–5.1)
Sodium: 138 mmol/L (ref 135–145)

## 2016-01-02 LAB — GLUCOSE, CAPILLARY
GLUCOSE-CAPILLARY: 94 mg/dL (ref 65–99)
GLUCOSE-CAPILLARY: 106 mg/dL — AB (ref 65–99)
Glucose-Capillary: 98 mg/dL (ref 65–99)
Glucose-Capillary: 104 mg/dL — ABNORMAL HIGH (ref 65–99)

## 2016-01-02 MED ORDER — PRO-STAT SUGAR FREE PO LIQD
30.0000 mL | Freq: Three times a day (TID) | ORAL | Status: DC
  Administered 2016-01-02 – 2016-01-09 (×12): 30 mL via ORAL
  Filled 2016-01-02 (×13): qty 30

## 2016-01-02 MED ORDER — LORAZEPAM 2 MG/ML IJ SOLN: 0.5000 mg | Freq: Two times a day (BID) | INTRAMUSCULAR | Status: DC | PRN

## 2016-01-02 MED ORDER — ADULT MULTIVITAMIN W/MINERALS CH
1.0000 | ORAL_TABLET | Freq: Every day | ORAL | Status: DC
  Administered 2016-01-02 – 2016-01-06 (×4): 1 via ORAL
  Filled 2016-01-02 (×4): qty 1

## 2016-01-02 MED ORDER — LORAZEPAM 2 MG/ML IJ SOLN
0.5000 mg | Freq: Every day | INTRAMUSCULAR | Status: DC
  Administered 2016-01-02: 0.5 mg via INTRAVENOUS
  Filled 2016-01-02: qty 1

## 2016-01-02 MED ORDER — NICOTINE POLACRILEX 2 MG MT LOZG
2.0000 mg | LOZENGE | OROMUCOSAL | Status: DC
  Filled 2016-01-02 (×14): qty 1

## 2016-01-02 MED ORDER — MAGIC MOUTHWASH
10.0000 mL | Freq: Four times a day (QID) | ORAL | Status: DC | PRN
  Filled 2016-01-02: qty 10

## 2016-01-02 MED ORDER — FLUCONAZOLE IN SODIUM CHLORIDE 100-0.9 MG/50ML-% IV SOLN
100.0000 mg | INTRAVENOUS | Status: DC
  Administered 2016-01-02: 100 mg via INTRAVENOUS
  Filled 2016-01-02 (×3): qty 50

## 2016-01-02 NOTE — Progress Notes (Signed)
PT Cancellation Note  Patient Details Name: Marcus [name] MRN: [id number] DOB: [birth year]   Cancelled Treatment:    Reason Eval/Treat Not Completed: Other (comment) Pt's wife present and he is very agitated this morning. Pt's RN requested that therapy hold for today and she stated that Ativan has been ordered for pt.   Marguarite Arbour A Marnie Fazzino 01/02/2016, 11:55 AM Wray Kearns, PT, DPT [phone]

## 2016-01-02 NOTE — Consult Note (Signed)
[facility] Face-to-Face Psychiatry Consult  (Capacity)  Reason for Consult:  Acute confusion  Referring Physician:  Hospitalist group  Patient Identification: Marcus [name] MRN:  [id number] Principal Diagnosis: Acute confusional state Diagnosis:   Patient Active Problem List   Diagnosis Date Noted  . Inadequate pain control [R52] 01/02/2016    Priority: High  . Acute confusional state [F05] 01/02/2016    Priority: High  . Leukocytosis [D72.829]   . Myopathy [G72.9]   . Other abnormalities of gait and mobility [R26.89]   . Left leg DVT ([place]) [I82.402] 12/25/2015  . Acute blood loss anemia [D62] 12/25/2015  . Gastrointestinal hemorrhage associated with duodenal ulcer [K26.4] 12/25/2015  . Melena [K92.1]   . 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 Neurogenic bowel [K59.2]   . Neurogenic bladder [N31.9]   . Hyperlipidemia [E78.5]   . 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 MRSA infection [A49.02] 12/23/2015  . MSSA (methicillin susceptible Staphylococcus aureus) septicemia ([place]) [A41.01] 12/23/2015  . Pressure injury of skin [L89.90] 12/19/2015  . Closed fracture of seventh thoracic vertebra ([place]) [S22.069A]   . T7 vertebral fracture ([place]) [S22.069A]   . Decreased sensation [R20.8]   . Atrial fibrillation with RVR ([place]) [I48.91] 12/14/2015  . Fall [W19.XXXA] 12/14/2015  . Community acquired pneumonia of left lung [J18.9] 12/14/2015  . Compression fracture of vertebra ([place]) [M48.50XA] 12/14/2015  . Urinary retention [R33.9] 12/14/2015  . PAF (paroxysmal atrial fibrillation) ([place]) [I48.0] 12/14/2015  . Lung mass [R91.8] 12/14/2015  . Lower extremity weakness [R29.898] 12/14/2015  . SOB (shortness of breath) [R06.02] 11/12/2015  . Chest pain [R07.9] 11/12/2015  . S/P carotid endarterectomy [Z98.890] 04/10/2015  . 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 Glaucoma  [H40.9] 03/20/2015  . Diabetes ([place]) [E11.9] 10/10/2014  . HLD (hyperlipidemia) [E78.5] 10/10/2014  . 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 Essential hypertension [I10]   . Carotid stenosis [I65.29]   . TIA (transient ischemic attack) [G45.9] 06/01/2014  . Malaise and fatigue [R53.81, R53.83] 11/08/2013  . Lumbago [M54.5]   . Benign essential tremor [G25.0]   . Vitamin D deficiency [E55.9]     Total Time spent with patient: 40 minutes  Subjective:   Marcus [name] is a 75 y.o. male patient admitted with reports of acute confusion and concern about ability to make medical decisions. Pt seen and chart reviewed. Pt is alert/oriented x4, calm, cooperative, and appropriate to situation. Pt denies suicidal/homicidal ideation and psychosis and does not appear to be responding to internal stimuli. During the assessment, pt did present with intermittent confusion although was primarily lucid and answered all orientation questions correctly. 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 The nurse gave the pt some Fentanyl during our assessment for pain 9/10 which he reports is uncontrolled with his current injuries. Pt was then confused and somewhat tangential although clearly more comfortable. There is some concern about adequate pain management vs. Decreased level of consciousness and acute confusion. Pain management consult ordered with recommendation to discontinue Ativan at this time. This pt does possess the mental ability to consent or decline medical treatments. His alteration in awareness seems to be directly linked to narcotic administration and careful management via pain management consult is advised.  HPI:  Modified from hospitalist HPI: "Marcus [name] is  A 40M with hypertension, diabetes, TIA on clopidogrel, s/p CEA, and GERD here with several  days of worsening malaise/fatigue and a recent fall who then developed Afib with RVR in the ED. He subsequently converted to NSR following uptitration of a diltiazem gtt and additional bolus dosing of metoprolol but is now back in atrial flutter.  Hospitalization also complicated by closed fracture of 7 thoracic vertebrae, R lower lobe lung mass, urinary retention and [name]."  Today, on 01/02/16, pt seen for psychiatry evaluation as above. Hospitalist group would like insight into pt's mental ability to make medical decisions as well as consultation for depression. See evaluation above. Pt has been agitated today per staff notes and now has scheduled ativan???  Past Psychiatric History: mild depression  Risk to Self: Is patient at risk for suicide?: No Risk to Others:   Prior Inpatient Therapy:   Prior Outpatient Therapy:    Past Medical History:  Past Medical History:  Diagnosis Date  . Anxiety state, unspecified   . 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 Carotid artery disease ([place])    a. s/p L CEA 2016.  Marland Kitchen Complication of anesthesia    " I woke up in the recovery room with tube in throat and panicked."  . 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 Essential hypertension e  . Herpes zoster without mention of complication   . History of hiatal hernia   . Hypertrophy of prostate with urinary obstruction and other lower urinary tract symptoms (LUTS)   . Insomnia, unspecified   . Junctional bradycardia    a. developed this with digoxin 12/2015 requiring cessation.  . Lumbago   . Lung mass    a. 12/2015: right lower lobe mass felt secondary to MSSA. Biopsy of right lung mass revealed inflammation/fibrosis favoring inflammatory process but malignancy not entirely ruled out.  Marland Kitchen MSSA (methicillin susceptible Staphylococcus aureus) septicemia ([place]) 12/23/2015  . Other and unspecified hyperlipidemia   . 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 Paroxysmal atrial  fibrillation ([place]) 12/14/2015   a. dx 12/2015, in/out on telemetry, digoxin stopped 2/2 junctional bradycardia.  . Pneumonia    bronchial  . Reflux esophagitis   . Seasonal allergies   . Stroke ([place])   . TIA (transient ischemic attack)   . Type II or unspecified type diabetes mellitus without mention of complication, not stated as uncontrolled   . Unspecified vitamin D deficiency   . Urethral stricture unspecified   . Urinary frequency     Past Surgical History:  Procedure Laterality Date  . CARDIAC CATHETERIZATION  1986   normal, Dr Lia Foyer  . ENDARTERECTOMY Left 06/08/2014   Procedure: LEFT CAROTID ARTERY ENDARTERECTOMY;  Surgeon: Rosetta Posner, MD;  Location: [place];  Service: Vascular;  Laterality: Left;  . ESOPHAGOGASTRODUODENOSCOPY N/A 12/27/2015   Procedure: ESOPHAGOGASTRODUODENOSCOPY (EGD);  Surgeon: Doran Stabler, MD;  Location: [facility] ENDOSCOPY;  Service: Endoscopy;  Laterality: N/A;  . ESOPHAGOGASTRODUODENOSCOPY N/A 01/01/2016   Procedure: ESOPHAGOGASTRODUODENOSCOPY (EGD);  Surgeon: Ladene Artist, MD;  Location: [facility] ENDOSCOPY;  Service: Endoscopy;  Laterality: N/A;  . EYE SURGERY  2014   cataract  . EYE SURGERY  2013   Retina  . IR GENERIC HISTORICAL  12/27/2015   IR ANGIOGRAM VISCERAL SELECTIVE 12/27/2015 Arne Cleveland, MD [facility]  . IR GENERIC HISTORICAL  12/27/2015   IR US GUIDE VASC ACCESS RIGHT 12/27/2015 Arne Cleveland,  MD [facility]  . IR GENERIC HISTORICAL  12/27/2015   IR ANGIOGRAM VISCERAL SELECTIVE 12/27/2015 Arne Cleveland, MD [facility]  . IR GENERIC HISTORICAL  12/27/2015   IR ANGIOGRAM VISCERAL SELECTIVE 12/27/2015 Arne Cleveland, MD [facility]  . PATCH ANGIOPLASTY Left 06/08/2014   Procedure: WITH DACRON PATCH ANGIOPLASTY;  Surgeon: Rosetta Posner, MD;  Location: [place];  Service: Vascular;  Laterality: Left;  . SPINAL FUSION  12/91,11/91   obtained bone from left lower leg  . stimulator 1991     spine  . stress thallium  1994    normal;  .  stretching bladder  07/2007   Dr Risa Grill  . TEE WITHOUT CARDIOVERSION N/A 12/20/2015   Procedure: TRANSESOPHAGEAL ECHOCARDIOGRAM (TEE);  Surgeon: Dorothy Spark, MD;  Location: [facility] ENDOSCOPY;  Service: Cardiovascular;  Laterality: N/A;   Family History:  Family History  Problem Relation Age of Onset  . Diabetes Mother   . Cancer Mother   . Hypertension Father   . Stroke Father   . Alcohol abuse Brother    Family Psychiatric  History: denies Social History:  History  Alcohol Use No     History  Drug Use No    Social History   Social History  . Marital status: Married    Spouse name: N/A  . Number of children: N/A  . Years of education: N/A   Social History Main Topics  . Smoking status: Former Smoker    Types: Cigarettes    Quit date: 01/01/1991  . Smokeless tobacco: Never Used     Comment: " Quit around 1986"  . Alcohol use No  . Drug use: No  . Sexual activity: Yes    Birth control/ protection: None   Other Topics Concern  . None   Social History Narrative  . None   Additional Social History:    Allergies:   Allergies  Allergen Reactions  . Codeine Other (See Comments)    constipation  . Demerol [Meperidine] Other (See Comments)    Drops blood pressure   . Contrast Media [Iodinated Diagnostic Agents] Nausea And Vomiting  . Gabapentin Other (See Comments)    Suicidal, depression  . Other Other (See Comments)    All narcotics make pt hallucinate    Labs:  Results for orders placed or performed during the hospital encounter of 12/25/15 (from the past 48 hour(s))  Glucose, capillary     Status: Abnormal   Collection Time: 12/31/15  4:43 PM  Result Value Ref Range   Glucose-Capillary 142 (H) 65 - 99 mg/dL  Glucose, capillary     Status: Abnormal   Collection Time: 12/31/15  8:40 PM  Result Value Ref Range   Glucose-Capillary 135 (H) 65 - 99 mg/dL  CBC     Status: Abnormal   Collection Time: 01/01/16  4:03 AM  Result Value Ref Range   WBC 10.7  (H) 4.0 - 10.5 K/uL   RBC 2.57 (L) 4.22 - 5.81 MIL/uL   Hemoglobin 8.0 (L) 13.0 - 17.0 g/dL   HCT 24.0 (L) 39.0 - 52.0 %   MCV 93.4 78.0 - 100.0 fL   MCH 31.1 26.0 - 34.0 pg   MCHC 33.3 30.0 - 36.0 g/dL   RDW 17.8 (H) 11.5 - 15.5 %   Platelets 71 (L) 150 - 400 K/uL    Comment: CONSISTENT WITH PREVIOUS RESULT  Glucose, capillary     Status: Abnormal   Collection Time: 01/01/16  7:29 AM  Result Value  Ref Range   Glucose-Capillary 115 (H) 65 - 99 mg/dL  Glucose, capillary     Status: None   Collection Time: 01/01/16 11:50 AM  Result Value Ref Range   Glucose-Capillary 93 65 - 99 mg/dL  Glucose, capillary     Status: None   Collection Time: 01/01/16  4:11 PM  Result Value Ref Range   Glucose-Capillary 79 65 - 99 mg/dL  Glucose, capillary     Status: Abnormal   Collection Time: 01/01/16  7:47 PM  Result Value Ref Range   Glucose-Capillary 112 (H) 65 - 99 mg/dL  CBC     Status: Abnormal   Collection Time: 01/02/16  4:50 AM  Result Value Ref Range   WBC 6.7 4.0 - 10.5 K/uL   RBC 2.53 (L) 4.22 - 5.81 MIL/uL   Hemoglobin 7.8 (L) 13.0 - 17.0 g/dL   HCT 23.7 (L) 39.0 - 52.0 %   MCV 93.7 78.0 - 100.0 fL   MCH 30.8 26.0 - 34.0 pg   MCHC 32.9 30.0 - 36.0 g/dL   RDW 17.6 (H) 11.5 - 15.5 %   Platelets 69 (L) 150 - 400 K/uL    Comment: CONSISTENT WITH PREVIOUS RESULT  Basic metabolic panel     Status: Abnormal   Collection Time: 01/02/16  4:50 AM  Result Value Ref Range   Sodium 138 135 - 145 mmol/L   Potassium 4.2 3.5 - 5.1 mmol/L   Chloride 109 101 - 111 mmol/L   CO2 25 22 - 32 mmol/L   Glucose, Bld 111 (H) 65 - 99 mg/dL   BUN 21 (H) 6 - 20 mg/dL   Creatinine, Ser 0.51 (L) 0.61 - 1.24 mg/dL   Calcium 7.1 (L) 8.9 - 10.3 mg/dL   GFR calc non Af Amer >60 >60 mL/min   GFR calc Af Amer >60 >60 mL/min    Comment: (NOTE) The eGFR has been calculated using the CKD EPI equation. This calculation has not been validated in all clinical situations. eGFR's persistently <60 mL/min signify  possible Chronic Kidney Disease.    Anion gap 4 (L) 5 - 15  Glucose, capillary     Status: None   Collection Time: 01/02/16  7:21 AM  Result Value Ref Range   Glucose-Capillary 98 65 - 99 mg/dL  Glucose, capillary     Status: None   Collection Time: 01/02/16 11:16 AM  Result Value Ref Range   Glucose-Capillary 94 65 - 99 mg/dL    Current Facility-Administered Medications  Medication Dose Route Frequency Provider Last Rate Last Dose  . 0.9 %  sodium chloride infusion   Intravenous Once Nelida Meuse III, MD      . acetaminophen (TYLENOL) tablet 1,000 mg  1,000 mg Oral Q6H PRN Waldemar Dickens, MD       Or  . acetaminophen (TYLENOL) suppository 650 mg  650 mg Rectal Q6H PRN Waldemar Dickens, MD      . alum & mag hydroxide-simeth (MAALOX/MYLANTA) 200-200-20 MG/5ML suspension 30 mL  30 mL Oral PRN Bertram Savin Sheikh, DO   30 mL at 12/31/15 1350  . calcitonin (salmon) (MIACALCIN/FORTICAL) nasal spray 1 spray  1 spray Alternating Nares Daily Waldemar Dickens, MD   1 spray at 12/28/15 [id number]  . collagenase (SANTYL) ointment   Topical Daily [place], DO      . dexamethasone (DECADRON) injection 4 mg  4 mg Intravenous Q12H Bertram Savin Sheikh, DO   4 mg at 01/02/16 0808  .  diltiazem (CARDIZEM CD) 24 hr capsule 180 mg  180 mg Oral Daily [facility], DO   180 mg at 01/01/16 1208  . feeding supplement (BOOST / RESOURCE BREEZE) liquid 1 Container  1 Container Oral TID BM Kerney Elbe, DO   1 Container at 12/31/15 1400  . feeding supplement (ENSURE ENLIVE) (ENSURE ENLIVE) liquid 237 mL  237 mL Oral BID PRN Bertram Savin Sheikh, DO      . fentaNYL (SUBLIMAZE) injection 12.5-50 mcg  12.5-50 mcg Intravenous Q2H PRN Bertram Savin Sheikh, DO   50 mcg at 01/02/16 0857  . fluconazole (DIFLUCAN) IVPB 100 mg  100 mg Intravenous Q24H Mariel Aloe, MD      . HYDROcodone-acetaminophen [facility]) 7.5-325 MG per tablet 1 tablet  1 tablet Oral Q6H PRN Kerney Elbe, DO   1 tablet at 01/01/16 2244   . insulin aspart (novoLOG) injection 0-5 Units  0-5 Units Subcutaneous QHS Waldemar Dickens, MD      . insulin aspart (novoLOG) injection 0-9 Units  0-9 Units Subcutaneous TID WC Waldemar Dickens, MD   1 Units at 12/31/15 1700  . ipratropium-albuterol (DUONEB) 0.5-2.5 (3) MG/3ML nebulizer solution 3 mL  3 mL Nebulization TID Bertram Savin Sheikh, DO   3 mL at 01/01/16 2024  . lactated ringers infusion   Intravenous Continuous Nelida Meuse III, MD   Stopped at 12/29/15 0700  . LORazepam (ATIVAN) injection 0.5 mg  0.5 mg Intravenous Daily Mariel Aloe, MD   0.5 mg at 01/02/16 1022  . LORazepam (ATIVAN) injection 0.5 mg  0.5 mg Intravenous BID PRN Mariel Aloe, MD      . nicotine polacrilex (COMMIT) lozenge 2 mg  2 mg Oral Q2H while awake Mariel Aloe, MD      . ondansetron [facility]) injection 4 mg  4 mg Intravenous Q6H PRN Vena Rua, PA-C   4 mg at 12/29/15 1435  . pantoprazole (PROTONIX) EC tablet 40 mg  40 mg Oral BID Vena Rua, PA-C   40 mg at 01/01/16 2239  . polyethylene glycol (MIRALAX / GLYCOLAX) packet 17 g  17 g Oral Daily PRN Waldemar Dickens, MD      . pregabalin (LYRICA) capsule 75 mg  75 mg Oral BID Mariel Aloe, MD   75 mg at 01/01/16 2240  . promethazine (PHENERGAN) tablet 25 mg  25 mg Oral Q6H PRN Waldemar Dickens, MD       Or  . promethazine (PHENERGAN) injection 12.5 mg  12.5 mg Intravenous Q6H PRN Waldemar Dickens, MD   12.5 mg at 12/26/15 9702   Or  . promethazine (PHENERGAN) suppository 25 mg  25 mg Rectal Q6H PRN Waldemar Dickens, MD      . senna-docusate (Senokot-S) tablet 1 tablet  1 tablet Oral BID PRN Waldemar Dickens, MD      . sodium chloride flush (NS) 0.9 % injection 10-40 mL  10-40 mL Intracatheter PRN Bertram Savin Sheikh, DO      . sodium chloride flush (NS) 0.9 % injection 3 mL  3 mL Intravenous Q12H Waldemar Dickens, MD   3 mL at 01/01/16 2242  . timolol (TIMOPTIC) 0.5 % ophthalmic solution 1 drop  1 drop Both Eyes BID Waldemar Dickens, MD   1 drop at  01/01/16 2240  . traZODone (DESYREL) tablet 50 mg  50 mg Oral QHS PRN,MR X 1 Omair Latif Sheikh, DO   50 mg at  01/01/16 2240  . vancomycin (VANCOCIN) 1,250 mg in sodium chloride 0.9 % 250 mL IVPB  1,250 mg Intravenous Q12H Romona Curls, RPH   1,250 mg at 01/02/16 1022    Musculoskeletal: Strength & Muscle Tone: decreased Gait & Station: in bed Patient leans: N/A  Psychiatric Specialty Exam: Physical Exam  Review of Systems  Psychiatric/Behavioral: Negative for hallucinations, substance abuse and suicidal ideas. The patient is nervous/anxious and has insomnia.   All other systems reviewed and are negative.   Blood pressure 133/78, pulse 67, temperature 97.9 F (36.6 C), temperature source Oral, resp. rate 13, weight 101.4 kg (223 lb 9.6 oz), SpO2 98 %.Body mass index is 35.02 kg/m.  General Appearance: Casual and Fairly Groomed  Eye Contact:  Good  Speech:  Clear and Coherent and Normal Rate  Volume:  Normal  Mood:  Anxious and in pain  Affect:  Appropriate and Congruent  Thought Process:  Coherent, Goal Directed, Linear and Descriptions of Associations: Intact  Orientation:  Full (Time, Place, and Person)  Thought Content:  Pain management, loss of function  Suicidal Thoughts:  No  Homicidal Thoughts:  No  Memory:  Immediate;   Fair Recent;   Fair Remote;   Fair  Judgement:  Fair  Insight:  Fair  Psychomotor Activity:  Decreased  Concentration:  Concentration: Fair and Attention Span: Fair  Recall:  [employer] of Knowledge:  Fair  Language:  Fair  Akathisia:  No  Handed:    AIMS (if indicated):     Assets:  Communication Skills Desire for Improvement Resilience Social Support Talents/Skills  ADL's:  Intact  Cognition:  WNL  Sleep:      Treatment Plan Summary: Acute confusional state secondary to narcotic pain med administration for uncontrolled pain  Disposition: No evidence of imminent risk to self or others at present.   Supportive therapy provided about  ongoing stressors.  -Discontinue Ativan -Pain management consult -This pt does possess the mental ability to consent or decline medical treatments.   Benjamine Mola,  01/02/2016 12:03 PM

## 2016-01-02 NOTE — Progress Notes (Signed)
OT Cancellation Note  Patient Details Name: Marcus [name] MRN: [id number] DOB: 1940/10/08   Cancelled Treatment:    Reason Eval/Treat Not Completed: Other (comment). Pt's wife present and he was is very agitated this morning. Pt's RN requested that therapy hold for today and she stated that Ativan has been ordered for pt  [name] 01/02/2016, 10:01 AM

## 2016-01-02 NOTE — Progress Notes (Signed)
Patient followed for IV antibiotics by ID.  He has a plan to be on vancomycin for 2 weeks and then cefazolin for 6 weeks after that.  Will start the vancomycin through November 15th per the previous plan and then can transition to cefazolin 2 grams every 8 hours for 6 weeks from November 16th to December 27th.   Scharlene Gloss, MD

## 2016-01-02 NOTE — Progress Notes (Signed)
Pt refused all p.o. Medications and stated that he was ready to die, he could see the light, and that this is a happy death for him. Pt is alert and oriented times 4.   Dr. Lonny Prude notified regarding pts status, vss.  I called pt wife to come and see him.  Will continue to closely monitor.

## 2016-01-02 NOTE — Progress Notes (Signed)
[facility]  [facility] is following Marcus [name] with the [facility] team to support home IV ABX upon DC if needed.  [facility] will continue to follow pt until DC to ensure home infusion needs are met as ordered.   If patient discharges after hours, please call [phone].   Yutaka Sierras 01/02/2016, 5:53 AM

## 2016-01-02 NOTE — Progress Notes (Signed)
PROGRESS NOTE    Marcus [name]  [id number] DOB: 1940-05-17 DOA: 12/25/2015 PCP: Jeanmarie Hubert, MD   Brief Narrative: Marcus [name] is a 74 y.o. 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   Assessment & Plan:   Active Problems:   Essential hypertension   Cerebrovascular accident (CVA) due to stenosis of left carotid artery (HCC)   Atrial fibrillation with RVR (HCC)   Urinary retention   Lower extremity weakness   T7 vertebral fracture (HCC)   Pressure injury of skin   MRSA infection   MSSA (methicillin susceptible Staphylococcus aureus) septicemia (HCC)   Paraplegia (HCC)   Acute blood loss anemia   Melena   Gastrointestinal hemorrhage associated with duodenal ulcer   Myopathy   Other abnormalities of gait and mobility   Leukocytosis   Symptomatic anemia Likely secondary to GI bleed secondary upper GI pathology. Suspect steroids have maintained a persistent ulcer which, with the addition of Eliquis, has been bleeding. S/p 4u PRBC. EGD on 11/1 significant for nonbleeding ulcer. -continue to hold plavix and Eliquis for 48 hours after EGD. Restart Eliquis 11/3 -daily CBC -Gastroenterology recommendations: signed off. Outpatient follow-up  ?Confusion Unsure if patient is confused or if he's lucid about his desires. Patient appears to have capacity. Very complicated. -Psych eval for capacity and likely depression/delirium -discontinue Lyrica as this was a new medication started yesterday  GI bleed Initial EGD significant for non-bleeding ulcers. Embolization was not performed by IR secondary to negative mesenteric arteriogram. 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 Patient has not had vancomycin since 10/25. Unsure of what happened. -continue 2 weeks of vancomycin with switch to cafazolin to complete 6 weeks of antibiotics per ID recommendations. Restart vancomycin -new ID recommendations: Vancomycin until 11/15, then Cefazolin 2g q8 hours from 11/16 to 12/27 -continue fentanyl and Norco prn -will add Lyrica 75mg  bid  Leukocytosis Likely reactive secondary to stress from recent procedures. Patient afebrile  Urinary retention -continue foley -outpatient follow-up with Urology  Atrial fibrillation with RVR Currently rate controlled -cardiology recommendations -continue diltiazem -anticoagulation needs to be figured out by discharge  Bilateral LE DVT Consideration for IVC filter. Discussed with IR. 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 He states he has had a great life and that God has given him a great life.   Objective: Vitals:   01/02/16 0745 01/02/16 0824 01/02/16 1259 01/02/16 1308  BP: 115/69 133/78  98/60  Pulse: 67  61 66  Resp: 13  15   Temp: 98 F (36.7 C) 97.9 F (36.6 C) 97.4 F (36.3 C) 97.9 F (36.6 C)  TempSrc: Oral Oral Oral Oral  SpO2: 98%  99% 100%  Weight:        Intake/Output Summary (Last 24 hours) at 01/02/16 1358 Last data filed at 01/02/16 0815  Gross per 24 hour  Intake              490 ml  Output              900 ml  Net             -410 ml   Filed Weights   12/31/15 0500 01/01/16 0623 01/02/16 0430  Weight: 100.7 kg (222 lb 0.1 oz) 101.3 kg (223 lb 6.4 oz) 101.4 kg (223 lb 9.6 oz)    Examination:  General exam: Appears calm and comfortable  Respiratory system: Clear and diminished to auscultation. Respiratory effort normal. Cardiovascular system: S1 & S2 heard, RRR. No murmurs, rubs, gallops or clicks. Gastrointestinal system: Abdomen is nondistended, soft and nontender. Normal bowel sounds heard. Central nervous system: Alert and oriented. CN intact. No sensation of feet and ankles bilaterally with 0/5 LE strength Extremities: No calf tenderness Skin: No cyanosis. No rashes Psychiatry: Judgement and insight appear normal. Mood & affect appropriate.     Data Reviewed: I have personally reviewed following labs and imaging studies  CBC:  Recent Labs Lab 12/27/15 0515  12/29/15 0805 12/29/15 2030 12/30/15 0456 12/31/15 0354 01/01/16 0403 01/02/16 0450  WBC 9.3  < > 11.3*  --  14.3* 13.1* 10.7* 6.7  NEUTROABS 8.9*  --   --   --   --   --   --   --   HGB 7.6*  < > 7.5* 8.5* 8.2* 8.1* 8.0* 7.8*  HCT 22.4*  < > 22.3* 25.4* 24.3* 24.0* 24.0* 23.7*  MCV 91.1  < > 92.1  --  91.4 92.0 93.4 93.7  PLT 175  < > 114*  --  94* 78* 71* 69*  < > = values in this interval not displayed. Basic Metabolic Panel:  Recent Labs Lab 12/27/15 0515 12/28/15 0510 12/29/15 0453 12/30/15 0456 12/31/15 0815 01/02/16 0450  NA 141 141 139 138 138 138  K 4.6 4.5 4.4 4.2 4.3 4.2  CL 112* 113* 112* 110 110 109  CO2 23 23 23 24 24 25   GLUCOSE 131* 132* 127* 116* 114* 111*  BUN 62*  49* 41* 31* 27* 21*  CREATININE 0.90 0.81 0.67 0.69 0.63 0.51*  CALCIUM 7.5* 7.5* 7.3* 7.2* 7.3* 7.1*  MG 2.6* 2.6* 2.4 2.1  --   --   PHOS 3.6 3.6 2.8 2.9  --   --    GFR: Estimated Creatinine Clearance: 94.7 mL/min (by C-G formula based on SCr of 0.51 mg/dL (L)). Liver Function Tests:  Recent Labs Lab 12/27/15 0515 12/28/15 0510 12/29/15 0453 12/30/15 0456 12/31/15 0815  AST 27 28 27 26  32  ALT 26 27 23 21 26   ALKPHOS 134* 65 56 53 59  BILITOT 0.7 0.8 0.8 0.8 0.5  PROT 3.8* 4.0* 3.8* 3.8* 3.7*  ALBUMIN 1.9* 1.9* 1.7* 1.6* 1.5*   No results for input(s): LIPASE, AMYLASE in the last 168 hours. No results for input(s): AMMONIA in the last 168 hours. Coagulation Profile:  Recent Labs Lab 12/29/15 2030  INR 1.16   Cardiac Enzymes: No results for input(s): CKTOTAL, CKMB, CKMBINDEX, TROPONINI in the last  168 hours. BNP (last 3 results) No results for input(s): PROBNP in the last 8760 hours. HbA1C: No results for input(s): HGBA1C in the last 72 hours. CBG:  Recent Labs Lab 01/01/16 1150 01/01/16 1611 01/01/16 1947 01/02/16 0721 01/02/16 1116  GLUCAP 93 79 112* 98 94   Lipid Profile: No results for input(s): CHOL, HDL, LDLCALC, TRIG, CHOLHDL, LDLDIRECT in the last 72 hours. Thyroid Function Tests: No results for input(s): TSH, T4TOTAL, FREET4, T3FREE, THYROIDAB in the last 72 hours. Anemia Panel: No results for input(s): VITAMINB12, FOLATE, FERRITIN, TIBC, IRON, RETICCTPCT in the last 72 hours. Sepsis Labs: No results for input(s): PROCALCITON, LATICACIDVEN in the last 168 hours.  No results found for this or any previous visit (from the past 240 hour(s)).       Radiology Studies: No results found.      Scheduled Meds: . sodium chloride   Intravenous Once  . calcitonin (salmon)  1 spray Alternating Nares Daily  . collagenase   Topical Daily  . dexamethasone  4 mg Intravenous Q12H  . diltiazem  180 mg Oral Daily  . feeding supplement  1 Container  Oral TID BM  . fluconazole (DIFLUCAN) IV  100 mg Intravenous Q24H  . insulin aspart  0-5 Units Subcutaneous QHS  . insulin aspart  0-9 Units Subcutaneous TID WC  . ipratropium-albuterol  3 mL Nebulization TID  . LORazepam  0.5 mg Intravenous Daily  . nicotine polacrilex  2 mg Oral Q2H while awake  . pantoprazole  40 mg Oral BID  . pregabalin  75 mg Oral BID  . sodium chloride flush  3 mL Intravenous Q12H  . timolol  1 drop Both Eyes BID  . vancomycin  1,250 mg Intravenous Q12H   Continuous Infusions: . lactated ringers Stopped (12/29/15 0700)     LOS: 8 days     Cordelia Poche [facility] 01/02/2016, 1:58 PM Pager: [phone]  If 7PM-7AM, please contact night-coverage [email or web address] Password [email or web address] 01/02/2016, 1:58 PM

## 2016-01-02 NOTE — Progress Notes (Signed)
Nutrition Follow-up  DOCUMENTATION CODES:   Obesity unspecified  INTERVENTION:   -D/c Boost Breeze po TID, each supplement provides 250 kcal and 9 grams of protein -30 ml Prostat TID, each supplement provides 100 kcals and 15 grams protein -MVI daily -Kozy Shack pudding TID with meals  NUTRITION DIAGNOSIS:   Inadequate oral intake related to poor appetite (abdominal pain) as evidenced by per patient/family report.  Ongoing  GOAL:   Patient will meet greater than or equal to 90% of their needs  Unmet  MONITOR:   PO intake, Supplement acceptance, Diet advancement, Labs, Weight trends, Skin, I & O's  REASON FOR ASSESSMENT:   Consult Assessment of nutrition requirement/status  ASSESSMENT:   75 y.o. male.  PMH DM. GERD. TIA.   Carotid stenosis, status post CEA. Anxiety. Pt with closed T7 fracture possibly lytic versus infectious. Pt  transferred [facility]. Patient's current complaints include intermittent pulling or burning sensation of his epigastric and right upper and left upper quadrants. Staff on CIR note dark to black stools for the last 24 hours.   Pt unavailable at time of visit. Per RN notes, pt has been refusing most cares, including PO meds.   Reviewed CWOCN note from 12/30/15; pt with stage II pressure injuries to inner gluteal folds and bilateral buttocks and unstageable pressure injury to sacrum.   Intake is very minimal. Documented PO intake 20-75%, averaging around 25% of meals. Pt has been refusing Boost Breeze supplements. Pt with increased nutrition needs due to multiple wounds.   Labs reviewed: CBGS: 79-112.   Diet Order:  Diet Heart Room service appropriate? Yes; Fluid consistency: Thin  Skin:  Wound (see comment) (UN sacrum, st II gluteal folds and bilateral buttocs)  Last BM:  01/02/16  Height:   Ht Readings from Last 1 Encounters:  12/24/15 5\' 7"  (1.702 m)    Weight:   Wt Readings from Last 1 Encounters:   01/02/16 223 lb 9.6 oz (101.4 kg)    Ideal Body Weight:  67.27 kg  BMI:  Body mass index is 35.02 kg/m.  Estimated Nutritional Needs:   Kcal:  1900-2100  Protein:  85-105 grams  Fluid:  1.9 - 2.1 L/day  EDUCATION NEEDS:   No education needs identified at this time  Tyesha Joffe A. Jimmye Norman, RD, LDN, CDE Pager: [phone] After hours Pager: [phone]

## 2016-01-03 ENCOUNTER — Ambulatory Visit: Payer: Medicare Other

## 2016-01-03 LAB — CBC
HEMATOCRIT: 21.8 % — AB (ref 39.0–52.0)
HEMATOCRIT: 25.1 % — AB (ref 39.0–52.0)
HEMOGLOBIN: 7.2 g/dL — AB (ref 13.0–17.0)
HEMOGLOBIN: 8.1 g/dL — AB (ref 13.0–17.0)
MCH: 31 pg (ref 26.0–34.0)
MCH: 30.5 pg (ref 26.0–34.0)
MCHC: 33 g/dL (ref 30.0–36.0)
MCHC: 32.3 g/dL (ref 30.0–36.0)
MCV: 94 fL (ref 78.0–100.0)
MCV: 94.4 fL (ref 78.0–100.0)
PLATELETS: 62 10*3/uL — AB (ref 150–400)
Platelets: 56 10*3/uL — ABNORMAL LOW (ref 150–400)
RBC: 2.32 MIL/uL — AB (ref 4.22–5.81)
RBC: 2.66 MIL/uL — AB (ref 4.22–5.81)
RDW: 17.7 % — ABNORMAL HIGH (ref 11.5–15.5)
RDW: 17.7 % — AB (ref 11.5–15.5)
WBC: 3.2 10*3/uL — AB (ref 4.0–10.5)
WBC: 3.9 10*3/uL — AB (ref 4.0–10.5)

## 2016-01-03 LAB — GLUCOSE, CAPILLARY
GLUCOSE-CAPILLARY: 123 mg/dL — AB (ref 65–99)
GLUCOSE-CAPILLARY: 114 mg/dL — AB (ref 65–99)
Glucose-Capillary: 160 mg/dL — ABNORMAL HIGH (ref 65–99)
Glucose-Capillary: 149 mg/dL — ABNORMAL HIGH (ref 65–99)

## 2016-01-03 MED ORDER — NICOTINE POLACRILEX 2 MG MT LOZG
2.0000 mg | LOZENGE | OROMUCOSAL | Status: DC | PRN
  Filled 2016-01-03: qty 1

## 2016-01-03 MED ORDER — FLUCONAZOLE 100 MG PO TABS
100.0000 mg | ORAL_TABLET | Freq: Every day | ORAL | Status: DC
  Administered 2016-01-03 – 2016-01-08 (×5): 100 mg via ORAL
  Filled 2016-01-03 (×8): qty 1

## 2016-01-03 MED ORDER — APIXABAN 5 MG PO TABS: 5.0000 mg | ORAL_TABLET | Freq: Two times a day (BID) | ORAL | Status: DC

## 2016-01-03 MED ORDER — APIXABAN 5 MG PO TABS
10.0000 mg | ORAL_TABLET | Freq: Two times a day (BID) | ORAL | Status: DC
  Administered 2016-01-03 – 2016-01-04 (×2): 10 mg via ORAL
  Filled 2016-01-03 (×2): qty 2

## 2016-01-03 MED ORDER — DEXAMETHASONE SODIUM PHOSPHATE 4 MG/ML IJ SOLN
4.0000 mg | INTRAMUSCULAR | Status: DC
  Administered 2016-01-04 – 2016-01-07 (×4): 4 mg via INTRAVENOUS
  Filled 2016-01-03 (×4): qty 1

## 2016-01-03 MED ORDER — LORAZEPAM 0.5 MG PO TABS
0.2500 mg | ORAL_TABLET | Freq: Once | ORAL | Status: AC
  Administered 2016-01-03: 0.25 mg via ORAL
  Filled 2016-01-03: qty 1

## 2016-01-03 MED ORDER — LORAZEPAM 0.5 MG PO TABS
0.5000 mg | ORAL_TABLET | Freq: Every day | ORAL | Status: DC | PRN
  Administered 2016-01-03 – 2016-01-06 (×3): 0.5 mg via ORAL
  Filled 2016-01-03 (×3): qty 1

## 2016-01-03 NOTE — Progress Notes (Signed)
Pt extremely agitated and emotional this morning.  Pt stated that he is seeing God and the white light coming for him.  Dr. Lonny Prude notified because pt ativan was Dc'd yesterday.  Will continue to closely monitor.

## 2016-01-03 NOTE — Progress Notes (Signed)
Physical Therapy Treatment Patient Details Name: Marcus [name] MRN: [id number] DOB: May 02, 1940 Today's Date: 01/03/2016    History of Present Illness 75 y.o. male being transferred back to acute care services from CIR with epigastric and RUQ pain.  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 Also found to have GIB, but unable to locate source with EGD.  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    PT Comments    Patient progressing slowly, but more motivated this session with wife present.  Continues with pain limiting treatment and tolerance to upright.  Feel planned pain management will be helpful in overall rehab course.  Patient still appropriate for CIR level rehab at d/c.  Follow Up Recommendations  CIR     Equipment Recommendations  Wheelchair (measurements PT);Wheelchair cushion (measurements PT);3in1 (PT);Other (comment)    Recommendations for Other Services       Precautions / Restrictions Precautions Precautions: Fall Precaution Comments: Paraplegia, multiple sacral and buttocks wounds; limit time OOB to 2 hours at a time    Mobility  Bed Mobility Overal bed mobility: Needs Assistance   Rolling: Mod assist Sidelying to sit: +2 for physical assistance;Max assist     Sit to sidelying: +2 for physical assistance;Max assist General bed mobility comments: able to assist some with rail pushing with hands on side of bed, and lowering trunk to elbow, but limited by pain  Transfers                 General transfer comment: declined OOB  Ambulation/Gait                 Stairs            Wheelchair Mobility    Modified Rankin (Stroke Patients Only)       Balance Overall balance assessment: Needs assistance Sitting-balance support: Bilateral upper extremity supported;Feet supported Sitting balance-Leahy Scale: Zero Sitting balance - Comments: leans back into support due to  pain, worked at [employer] about 8 minutes with cues for head movement for moving ant/post and for utilizing reach for lateral weight shifts; pt perfomed with assist and cues, but requests to stop due to pain                            Cognition Arousal/Alertness: Awake/alert Behavior During Therapy: WFL for tasks assessed/performed Overall Cognitive Status: Impaired/Different from baseline Area of Impairment: Attention;Problem solving   Current Attention Level: Selective Memory: Decreased short-term memory         General Comments: alert and oriented, difficulty with maintaining focus due to distraction of pain.  Has desire to walk in three weeks for Thanksgiving celebration at house at [facility], yet wants [place] to take it slow with him due to apin    Exercises Other Exercises Other Exercises: demonstrated to wife heel cord stretches and she performed x 1  Other Exercises: seated cervical AROM bilateral x 5 reps each    General Comments General comments (skin integrity, edema, etc.): Continues with reflexive movment at feet with touch/stretch      Pertinent Vitals/Pain Faces Pain Scale: Hurts whole lot Pain Location: shoulders and back with mobility Pain Descriptors / Indicators: Grimacing;Aching Pain Intervention(s): Premedicated before session;Monitored during session;Limited activity within patient's tolerance    Home Living  Prior Function            PT Goals (current goals can now be found in the care plan section) Progress towards PT goals: Progressing toward goals    Frequency    Min 3X/week      PT Plan Current plan remains appropriate;Frequency needs to be updated    Co-evaluation             End of Session   Activity Tolerance: Patient limited by fatigue;Patient limited by pain Patient left: in bed;with call bell/phone within reach;with family/visitor present     Time: 1315-1350 PT Time Calculation (min)  (ACUTE ONLY): 35 min  Charges:  $Therapeutic Activity: 23-37 mins                    G CodesReginia Naas 01-22-2016, 6:07 PM  Magda Kiel, [place] 2016/01/22

## 2016-01-03 NOTE — Progress Notes (Addendum)
ANTICOAGULATION CONSULT NOTE - Initial Consult  Pharmacy Consult for apixaban Indication: atrial fibrillation, bilateral DVT  Allergies  Allergen Reactions  . Codeine Other (See Comments)    constipation  . Demerol [Meperidine] Other (See Comments)    Drops blood pressure   . Contrast Media [Iodinated Diagnostic Agents] Nausea And Vomiting  . Gabapentin Other (See Comments)    Suicidal, depression  . Other Other (See Comments)    All narcotics make pt hallucinate    Patient Measurements: Weight: 226 lb 14.4 oz (102.9 kg)  Vital Signs: Temp: 98.6 F (37 C) (11/03 1105) Temp Source: Oral (11/03 1105) BP: 105/64 (11/03 1105) Pulse Rate: 62 (11/03 1105)  Labs:  Recent Labs  01/02/16 0450 01/03/16 0443 01/03/16 1351  HGB 7.8* 7.2* 8.1*  HCT 23.7* 21.8* 25.1*  PLT 69* 56* 62*  CREATININE 0.51*  --   --     Estimated Creatinine Clearance: 95.4 mL/min (by C-G formula based on SCr of 0.51 mg/dL (L)).   Medical History: Past Medical History:  Diagnosis Date  . Anxiety state, unspecified   . 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 Carotid artery disease ([place])    a. s/p L CEA 2016.  Marland Kitchen Complication of anesthesia    " I woke up in the recovery room with tube in throat and panicked."  . 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 Essential hypertension e  . Herpes zoster without mention of complication   . History of hiatal hernia   . Hypertrophy of prostate with urinary obstruction and other lower urinary tract symptoms (LUTS)   . Insomnia, unspecified   . Junctional bradycardia    a. developed this with digoxin 12/2015 requiring cessation.  . Lumbago   . Lung mass    a. 12/2015: right lower lobe mass felt secondary to MSSA. Biopsy of right lung mass revealed inflammation/fibrosis favoring inflammatory process but malignancy not entirely ruled out.  Marland Kitchen MSSA (methicillin susceptible Staphylococcus aureus) septicemia ([place]) 12/23/2015  . Other and unspecified  hyperlipidemia   . 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 Paroxysmal atrial fibrillation ([place]) 12/14/2015   a. dx 12/2015, in/out on telemetry, digoxin stopped 2/2 junctional bradycardia.  . Pneumonia    bronchial  . Reflux esophagitis   . Seasonal allergies   . Stroke ([place])   . TIA (transient ischemic attack)   . Type II or unspecified type diabetes mellitus without mention of complication, not stated as uncontrolled   . Unspecified vitamin D deficiency   . Urethral stricture unspecified   . Urinary frequency     Assessment: 74 yom on apixaban 5mg  BID PTA for afib and bilateral DVT (10/25 - no anticoagulation resumed at that time due to GIB - was on 5mg  BID for afib). Non-bleeding ulcers this admit, GIB hx noted, considering for IVC filter at some point. Pharmacy consulted to resume apixaban. Not previously on anticoagulation this admit. Discussed with MD - aware of low Hg and plt but ok with resuming anticoagulation at treatment dose for DVT with CBC now stable. Will montior closely. S/p 4u PRBC - Hg up 8.1, plt slightly up to 62, no overt bleeding reported and GI signed off.  Goal of Therapy:  Stroke prevention, DVT treatment/prevention Monitor platelets by anticoagulation protocol: Yes   Plan:  Apixaban 10mg  BID x 7 days; then 5mg  PO BID Follow CBC closely Monitor for s/sx bleeding   Elicia Lamp, PharmD, BCPS Clinical Pharmacist 01/03/2016 3:20 PM

## 2016-01-03 NOTE — Progress Notes (Signed)
PROGRESS NOTE    Marcus [name]  [id number] DOB: December 18, 1940 DOA: 12/25/2015 PCP: Jeanmarie Hubert, MD   Brief Narrative: Marcus [name] is a 75 y.o. 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   Assessment & Plan:   Principal Problem:   Acute confusional state Active Problems:   Essential hypertension   Cerebrovascular accident (CVA) due to stenosis of left carotid artery (HCC)   Atrial fibrillation with RVR (HCC)   Urinary retention   Lower extremity weakness   T7 vertebral fracture (HCC)   Pressure injury of skin   MRSA infection   MSSA (methicillin susceptible Staphylococcus aureus) septicemia (HCC)   Paraplegia (HCC)   Acute blood loss anemia   Melena   Gastrointestinal hemorrhage associated with duodenal ulcer   Myopathy   Other abnormalities of gait and mobility   Leukocytosis   Inadequate pain control   Symptomatic anemia Likely secondary to GI bleed secondary upper GI pathology. Suspect steroids have maintained a persistent ulcer which, with the addition of Eliquis, has been bleeding. S/p 4u PRBC. EGD on 11/1 significant for nonbleeding ulcer. - plan to restart Eliquis today if hemoglobin stable. -daily CBC -Gastroenterology recommendations: signed off. Outpatient follow-up  ?Confusion Seems improved today. Concern for polypharmacy. Psychiatry recommended pain management consult. Ativan discontinued last night. Patient has capacity -will restart ativan as prn daily only -follow-up pain management recs  GI bleed Initial EGD significant for non-bleeding ulcers. Embolization was not performed by IR secondary to negative mesenteric arteriogram. Hemoglobin dropping. No evidence of bleeding. -plavix stopped -continue protonix -continue to wean decadron in setting of significant GI ulceration -Recheck CBC this afternoon  Esophageal candidiasis Seen on  EGD -continue diflucan for 10 day course -magic mouthwash  Paraplegia secondary to T7 compression fracture Discitis Abscess secondary to MSSA with bacteremia Patient started on antibiotics prior to admission. Patient has not had vancomycin since 10/25. Unsure of what happened. -continue 2 weeks of vancomycin with switch to cafazolin to complete 6 weeks of antibiotics per ID recommendations. Restart vancomycin -new ID recommendations: Vancomycin until 11/15, then Cefazolin 2g q8 hours from 11/16 to 12/27 -continue fentanyl and Norco prn -will consider re-adding Lyrica at some point -palliative care  Leukocytosis Likely reactive secondary to stress from recent procedures. Patient afebrile. Resolved.  Thrombocytopenia Patient not on heparin or lovenox. Trending down. No bleeding. -recheck this afternoon  Urinary retention -continue foley -outpatient follow-up with Urology  Atrial fibrillation with RVR Currently rate controlled -cardiology recommendations -continue diltiazem -anticoagulation needs to be figured out by discharge  Bilateral LE DVT Consideration for IVC filter. Discussed with IR. GI recommending restarting anticoagulation.  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 Otherwise, no other  concerns.   Objective: Vitals:   01/03/16 0439 01/03/16 0731 01/03/16 0903 01/03/16 1105  BP: 112/68 109/73  105/64  Pulse: 70 77  62  Resp: 16 20  15   Temp: 97.7 F (36.5 C) 97.7 F (36.5 C)  98.6 F (37 C)  TempSrc: Oral Oral  Oral  SpO2: 98% 97% 97% 100%  Weight: 102.9 kg (226 lb 14.4 oz)       Intake/Output Summary (Last 24 hours) at 01/03/16 1259 Last data filed at 01/03/16 0447  Gross per 24 hour  Intake              790 ml  Output             1250 ml  Net             -460 ml   Filed Weights   01/01/16 0623 01/02/16 0430 01/03/16 0439  Weight: 101.3 kg (223 lb 6.4 oz) 101.4 kg (223 lb 9.6 oz) 102.9 kg (226 lb 14.4 oz)    Examination:  General exam: Appears calm and comfortable  Respiratory system: Clear and diminished to auscultation. Respiratory effort normal. Cardiovascular system: S1 & S2 heard, RRR. No murmurs, rubs, gallops or clicks. Gastrointestinal system: Abdomen is nondistended, soft and nontender. Normal bowel sounds heard. Central nervous system: Alert and oriented. CN intact. No sensation of feet and ankles bilaterally with 0/5 LE strength Extremities: No calf tenderness Skin: No cyanosis. No rashes Psychiatry: Judgement and insight appear normal. Mood & affect appropriate.     Data Reviewed: I have personally reviewed following labs and imaging studies  CBC:  Recent Labs Lab 12/30/15 0456 12/31/15 0354 01/01/16 0403 01/02/16 0450 01/03/16 0443  WBC 14.3* 13.1* 10.7* 6.7 3.2*  HGB 8.2* 8.1* 8.0* 7.8* 7.2*  HCT 24.3* 24.0* 24.0* 23.7* 21.8*  MCV 91.4 92.0 93.4 93.7 94.0  PLT 94* 78* 71* 69* 56*   Basic Metabolic Panel:  Recent Labs Lab 12/28/15 0510 12/29/15 0453 12/30/15 0456 12/31/15 0815 01/02/16 0450  NA 141 139 138 138 138  K 4.5 4.4 4.2 4.3 4.2  CL 113* 112* 110 110 109  CO2 23 23 24 24 25   GLUCOSE 132* 127* 116* 114* 111*  BUN 49* 41* 31* 27* 21*  CREATININE 0.81 0.67 0.69 0.63 0.51*  CALCIUM 7.5* 7.3* 7.2* 7.3* 7.1*    MG 2.6* 2.4 2.1  --   --   PHOS 3.6 2.8 2.9  --   --    GFR: Estimated Creatinine Clearance: 95.4 mL/min (by C-G formula based on SCr of 0.51 mg/dL (L)). Liver Function Tests:  Recent Labs Lab 12/28/15 0510 12/29/15 0453 12/30/15 0456 12/31/15 0815  AST 28 27 26  32  ALT 27 23 21 26   ALKPHOS 65 56 53 59  BILITOT 0.8 0.8 0.8 0.5  PROT 4.0* 3.8* 3.8* 3.7*  ALBUMIN 1.9* 1.7* 1.6* 1.5*   No results for input(s): LIPASE, AMYLASE in the last 168 hours. No results for input(s): AMMONIA in the last 168 hours. Coagulation Profile:  Recent Labs Lab 12/29/15 2030  INR 1.16   Cardiac Enzymes: No results for input(s): CKTOTAL, CKMB, CKMBINDEX, TROPONINI in the last 168 hours. BNP (last 3 results) No results for input(s): PROBNP in the last 8760 hours. HbA1C: No results for input(s): HGBA1C in the last 72 hours. CBG:  Recent Labs Lab 01/02/16 1116 01/02/16 1706 01/02/16 2117 01/03/16 0729 01/03/16 1102  GLUCAP 94 106* 104* 123* 114*   Lipid Profile: No results for input(s): CHOL, HDL, LDLCALC, TRIG, CHOLHDL, LDLDIRECT in the last 72 hours. Thyroid Function Tests: No results for input(s): TSH, T4TOTAL, FREET4, T3FREE, THYROIDAB in the last 72  hours. Anemia Panel: No results for input(s): VITAMINB12, FOLATE, FERRITIN, TIBC, IRON, RETICCTPCT in the last 72 hours. Sepsis Labs: No results for input(s): PROCALCITON, LATICACIDVEN in the last 168 hours.  No results found for this or any previous visit (from the past 240 hour(s)).       Radiology Studies: No results found.      Scheduled Meds: . sodium chloride   Intravenous Once  . calcitonin (salmon)  1 spray Alternating Nares Daily  . collagenase   Topical Daily  . [START ON 01/04/2016] dexamethasone  4 mg Intravenous Q24H  . diltiazem  180 mg Oral Daily  . feeding supplement (PRO-STAT SUGAR FREE 64)  30 mL Oral TID BM  . fluconazole  100 mg Oral Daily  . insulin aspart  0-5 Units Subcutaneous QHS  . insulin  aspart  0-9 Units Subcutaneous TID WC  . ipratropium-albuterol  3 mL Nebulization TID  . multivitamin with minerals  1 tablet Oral Daily  . pantoprazole  40 mg Oral BID  . sodium chloride flush  3 mL Intravenous Q12H  . timolol  1 drop Both Eyes BID  . vancomycin  1,250 mg Intravenous Q12H   Continuous Infusions: . lactated ringers Stopped (12/29/15 0700)     LOS: 9 days     Cordelia Poche [facility] 01/03/2016, 12:59 PM Pager: [phone]  If 7PM-7AM, please contact night-coverage [email or web address] Password [email or web address] 01/03/2016, 12:59 PM

## 2016-01-04 DIAGNOSIS — Z515 Encounter for palliative care: Secondary | ICD-10-CM

## 2016-01-04 DIAGNOSIS — M4854XA Collapsed vertebra, not elsewhere classified, thoracic region, initial encounter for fracture: Secondary | ICD-10-CM

## 2016-01-04 DIAGNOSIS — M4854XG Collapsed vertebra, not elsewhere classified, thoracic region, subsequent encounter for fracture with delayed healing: Secondary | ICD-10-CM

## 2016-01-04 LAB — BASIC METABOLIC PANEL
ANION GAP: 5 (ref 5–15)
BUN: 22 mg/dL — ABNORMAL HIGH (ref 6–20)
CALCIUM: 7.4 mg/dL — AB (ref 8.9–10.3)
CO2: 24 mmol/L (ref 22–32)
CREATININE: 0.5 mg/dL — AB (ref 0.61–1.24)
Chloride: 111 mmol/L (ref 101–111)
Glucose, Bld: 109 mg/dL — ABNORMAL HIGH (ref 65–99)
Potassium: 3.8 mmol/L (ref 3.5–5.1)
SODIUM: 140 mmol/L (ref 135–145)

## 2016-01-04 LAB — CBC WITH DIFFERENTIAL/PLATELET
BASOS PCT: 0 %
Basophils Absolute: 0 10*3/uL (ref 0.0–0.1)
EOS ABS: 0 10*3/uL (ref 0.0–0.7)
EOS PCT: 0 %
HCT: 19.6 % — ABNORMAL LOW (ref 39.0–52.0)
Hemoglobin: 6.4 g/dL — CL (ref 13.0–17.0)
LYMPHS ABS: 0.1 10*3/uL — AB (ref 0.7–4.0)
Lymphocytes Relative: 5 %
MCH: 30.8 pg (ref 26.0–34.0)
MCHC: 32.7 g/dL (ref 30.0–36.0)
MCV: 94.2 fL (ref 78.0–100.0)
MONO ABS: 0.1 10*3/uL (ref 0.1–1.0)
Monocytes Relative: 3 %
NEUTROS PCT: 92 %
Neutro Abs: 1.6 10*3/uL — ABNORMAL LOW (ref 1.7–7.7)
PLATELETS: 46 10*3/uL — AB (ref 150–400)
RBC: 2.08 MIL/uL — AB (ref 4.22–5.81)
RDW: 17.7 % — AB (ref 11.5–15.5)
WBC: 1.8 10*3/uL — AB (ref 4.0–10.5)

## 2016-01-04 LAB — GLUCOSE, CAPILLARY
GLUCOSE-CAPILLARY: 130 mg/dL — AB (ref 65–99)
GLUCOSE-CAPILLARY: 95 mg/dL (ref 65–99)
Glucose-Capillary: 98 mg/dL (ref 65–99)
Glucose-Capillary: 90 mg/dL (ref 65–99)

## 2016-01-04 LAB — CBC
HCT: 23.5 % — ABNORMAL LOW (ref 39.0–52.0)
HEMATOCRIT: 21.6 % — AB (ref 39.0–52.0)
HEMOGLOBIN: 7.6 g/dL — AB (ref 13.0–17.0)
HEMOGLOBIN: 7 g/dL — AB (ref 13.0–17.0)
MCH: 30.4 pg (ref 26.0–34.0)
MCH: 30.6 pg (ref 26.0–34.0)
MCHC: 32.3 g/dL (ref 30.0–36.0)
MCHC: 32.4 g/dL (ref 30.0–36.0)
MCV: 94 fL (ref 78.0–100.0)
MCV: 94.3 fL (ref 78.0–100.0)
PLATELETS: 54 10*3/uL — AB (ref 150–400)
Platelets: 47 10*3/uL — ABNORMAL LOW (ref 150–400)
RBC: 2.5 MIL/uL — AB (ref 4.22–5.81)
RBC: 2.29 MIL/uL — AB (ref 4.22–5.81)
RDW: 17.7 % — ABNORMAL HIGH (ref 11.5–15.5)
RDW: 17.6 % — ABNORMAL HIGH (ref 11.5–15.5)
WBC: 2.1 10*3/uL — ABNORMAL LOW (ref 4.0–10.5)
WBC: 1.9 10*3/uL — AB (ref 4.0–10.5)

## 2016-01-04 LAB — IRON AND TIBC
IRON: 46 ug/dL (ref 45–182)
Saturation Ratios: 30 % (ref 17.9–39.5)
TIBC: 155 ug/dL — AB (ref 250–450)
UIBC: 109 ug/dL

## 2016-01-04 LAB — TSH: TSH: 2.037 u[IU]/mL (ref 0.350–4.500)

## 2016-01-04 LAB — FERRITIN: FERRITIN: 1279 ng/mL — AB (ref 24–336)

## 2016-01-04 LAB — RETICULOCYTES
RBC.: 2.44 MIL/uL — ABNORMAL LOW (ref 4.22–5.81)
RETIC CT PCT: 3.3 % — AB (ref 0.4–3.1)
Retic Count, Absolute: 80.5 10*3/uL (ref 19.0–186.0)

## 2016-01-04 LAB — PREPARE RBC (CROSSMATCH)

## 2016-01-04 LAB — AMMONIA: AMMONIA: 22 umol/L (ref 9–35)

## 2016-01-04 LAB — VANCOMYCIN, TROUGH: VANCOMYCIN TR: 18 ug/mL (ref 15–20)

## 2016-01-04 MED ORDER — SODIUM CHLORIDE 0.9 % IV SOLN: Freq: Once | INTRAVENOUS | Status: DC

## 2016-01-04 MED ORDER — OLANZAPINE 2.5 MG PO TABS
2.5000 mg | ORAL_TABLET | ORAL | Status: AC
  Administered 2016-01-04: 2.5 mg via ORAL
  Filled 2016-01-04: qty 1

## 2016-01-04 MED ORDER — OLANZAPINE 2.5 MG PO TABS
2.5000 mg | ORAL_TABLET | Freq: Every day | ORAL | Status: DC
  Administered 2016-01-04: 2.5 mg via ORAL
  Filled 2016-01-04: qty 1

## 2016-01-04 NOTE — Progress Notes (Signed)
PROGRESS NOTE    Marcus [name]  [id number] DOB: [birth year] DOA: 12/25/2015 PCP: Jeanmarie Hubert, MD   Brief Narrative: Marcus [name] is a 75 y.o. 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   Assessment & Plan:   Principal Problem:   Acute confusional state Active Problems:   Essential hypertension   Cerebrovascular accident (CVA) due to stenosis of left carotid artery (HCC)   Atrial fibrillation with RVR (HCC)   Urinary retention   Lower extremity weakness   T7 vertebral fracture (HCC)   Pressure injury of skin   MRSA infection   MSSA (methicillin susceptible Staphylococcus aureus) septicemia (HCC)   Paraplegia (HCC)   Acute blood loss anemia   Melena   Gastrointestinal hemorrhage associated with duodenal ulcer   Myopathy   Other abnormalities of gait and mobility   Leukocytosis   Inadequate pain control   Compression fracture of thoracic spine, non-traumatic (HCC)   Palliative care encounter   Symptomatic anemia Likely secondary to GI bleed secondary upper GI pathology. Suspect steroids have maintained a persistent ulcer which, with the addition of Eliquis, has been bleeding. S/p 4u PRBC. EGD on 11/1 significant for nonbleeding ulcer. - plan to restart Eliquis today if hemoglobin stable. -daily CBC -Gastroenterology recommendations: signed off. Outpatient follow-up  ?Confusion Worsened today. Does not remember me. Afebrile. Possibly metabolic vs neurologic vs pain mediated. -ativan prn daily -follow-up palliative care recommendations for pain management -ammonia, TSH, CT head  GI bleed Initial EGD significant for non-bleeding ulcers. Embolization was not performed by IR secondary to negative mesenteric arteriogram. Hemoglobin dropping. No evidence of bleeding. -plavix stopped -continue protonix -continue to wean decadron in setting of significant GI  ulceration  Esophageal candidiasis Seen on EGD -continue diflucan for 10 day course -magic mouthwash  Paraplegia secondary to T7 compression fracture Discitis Abscess secondary to MSSA with bacteremia Patient started on antibiotics prior to admission. Patient has not had vancomycin since 10/25. Unsure of what happened. -continue 2 weeks of vancomycin with switch to cafazolin to complete 6 weeks of antibiotics per ID recommendations. Restart vancomycin -new ID recommendations: Vancomycin until 11/15, then Cefazolin 2g q8 hours from 11/16 to 12/27 -continue fentanyl and Norco prn -will consider re-adding Lyrica at some point -palliative care  Leukocytosis Likely reactive secondary to stress from recent procedures. Patient afebrile. Resolved.  Thrombocytopenia Patient not on heparin or lovenox. Stable. Not bleeding. -repeat CBC -path review  Leukopenia -add-on CBC differential -path review -may consult hematology pending differential  Urinary retention -continue foley -call urology to evaluate for this since has not been able to see outpatient  Atrial fibrillation with RVR Currently rate controlled -cardiology recommendations: signed off -continue diltiazem -Eliquis for anticoagulation  Bilateral LE DVT Eliquis restarted  Anxiety Insomnia -continue ativan prn daily -continue trazodone -Zyprexa started per palliative care recommendations  Severe protein calorie malnutrition -nutrition recommendations -Ensure  History of TIA/CVA -plavix dc'd secondary to GI bleed  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   Objective: Vitals:   01/04/16 0015 01/04/16 0455 01/04/16 0912 01/04/16 0937  BP: (!) 113/96 (!) 142/83  (!) 155/96  Pulse: 76     Resp: (!) 21 18  (!) 24  Temp: 97.8 F (36.6 C) 98.8 F (37.1 C)  97.6 F (36.4 C)  TempSrc: Oral Oral  Oral  SpO2: 99% 98% 99% 99%  Weight:  102.3 kg (225 lb 9.6 oz)      Intake/Output Summary (Last 24 hours) at 01/04/16 1409 Last data filed at 01/04/16 0919  Gross per 24 hour  Intake              120 ml  Output             1400 ml  Net            -1280 ml   Filed Weights   01/02/16 0430 01/03/16 0439 01/04/16 0455  Weight: 101.4 kg (223 lb 9.6 oz) 102.9 kg (226 lb 14.4 oz) 102.3 kg (225 lb 9.6 oz)    Examination:  General exam: Appears calm and comfortable  Respiratory system: Clear and diminished to auscultation. Respiratory effort normal. Cardiovascular system: S1 & S2 heard, RRR. No murmurs, rubs, gallops or clicks. Gastrointestinal system: Abdomen is nondistended, soft and nontender. Normal bowel sounds heard. Central nervous system: Alert and oriented. CN intact. Minimal sensation in bilateral lower extremities. 0/5 LE strength bilaterally Extremities: No calf tenderness Skin: No cyanosis. No rashes Psychiatry: Judgement and insight appear normal. Mood & affect appropriate.     Data Reviewed: I have personally reviewed following labs and imaging studies  CBC:  Recent Labs Lab 01/01/16 0403 01/02/16 0450 01/03/16 0443 01/03/16 1351 01/04/16 0915  WBC 10.7* 6.7 3.2* 3.9* 2.1*  HGB 8.0* 7.8* 7.2* 8.1* 7.6*  HCT 24.0* 23.7* 21.8* 25.1* 23.5*  MCV 93.4 93.7 94.0 94.4 94.0  PLT 71* 69* 56* 62* 54*   Basic Metabolic Panel:  Recent Labs Lab 12/29/15 0453 12/30/15 0456 12/31/15 0815  01/02/16 0450 01/04/16 0915  NA 139 138 138 138 140  K 4.4 4.2 4.3 4.2 3.8  CL 112* 110 110 109 111  CO2 23 24 24 25 24   GLUCOSE 127* 116* 114* 111* 109*  BUN 41* 31* 27* 21* 22*  CREATININE 0.67 0.69 0.63 0.51* 0.50*  CALCIUM 7.3* 7.2* 7.3* 7.1* 7.4*  MG 2.4 2.1  --   --   --   PHOS 2.8 2.9  --   --   --    GFR: Estimated Creatinine Clearance: 95.2 mL/min (by C-G formula based on SCr of 0.5 mg/dL (L)). Liver Function Tests:  Recent Labs Lab 12/29/15 0453 12/30/15 0456 12/31/15 0815  AST 27 26 32  ALT 23 21 26   ALKPHOS 56 53 59  BILITOT 0.8 0.8 0.5  PROT 3.8* 3.8* 3.7*  ALBUMIN 1.7* 1.6* 1.5*   No results for input(s): LIPASE, AMYLASE in the last 168 hours.  Recent Labs Lab 01/04/16 0915  AMMONIA 22   Coagulation Profile:  Recent Labs Lab 12/29/15 2030  INR 1.16   Cardiac Enzymes: No results for input(s): CKTOTAL, CKMB, CKMBINDEX, TROPONINI in the last 168 hours. BNP (last 3 results) No results for input(s): PROBNP in the last 8760 hours. HbA1C: No results for input(s): HGBA1C in the last 72 hours. CBG:  Recent Labs Lab 01/03/16 1102 01/03/16 1610 01/03/16 2119 01/04/16 0740 01/04/16 1113  GLUCAP 114* 160* 149* 130* 98  Lipid Profile: No results for input(s): CHOL, HDL, LDLCALC, TRIG, CHOLHDL, LDLDIRECT in the last 72 hours. Thyroid Function Tests:  Recent Labs  01/04/16 0915  TSH 2.037   Anemia Panel: No results for input(s): VITAMINB12, FOLATE, FERRITIN, TIBC, IRON, RETICCTPCT in the last 72 hours. Sepsis Labs: No results for input(s): PROCALCITON, LATICACIDVEN in the last 168 hours.  No results found for this or any previous visit (from the past 240 hour(s)).       Radiology Studies: No results found.      Scheduled Meds: . sodium chloride   Intravenous Once  . apixaban  10 mg Oral BID   Followed by  . [START ON 01/10/2016] apixaban  5 mg Oral BID  . calcitonin (salmon)  1 spray Alternating Nares Daily  . collagenase    Topical Daily  . dexamethasone  4 mg Intravenous Q24H  . diltiazem  180 mg Oral Daily  . feeding supplement (PRO-STAT SUGAR FREE 64)  30 mL Oral TID BM  . fluconazole  100 mg Oral Daily  . insulin aspart  0-5 Units Subcutaneous QHS  . insulin aspart  0-9 Units Subcutaneous TID WC  . ipratropium-albuterol  3 mL Nebulization TID  . multivitamin with minerals  1 tablet Oral Daily  . OLANZapine  2.5 mg Oral QHS  . pantoprazole  40 mg Oral BID  . sodium chloride flush  3 mL Intravenous Q12H  . timolol  1 drop Both Eyes BID  . vancomycin  1,250 mg Intravenous Q12H   Continuous Infusions: . lactated ringers Stopped (12/29/15 0700)     LOS: 10 days     Cordelia Poche [facility] 01/04/2016, 2:09 PM Pager: [phone]  If 7PM-7AM, please contact night-coverage [email or web address] Password [email or web address] 01/04/2016, 2:09 PM

## 2016-01-04 NOTE — Progress Notes (Signed)
Paged for critical hemoglobin of 6.4. Reviewed CBC and WBC down to 1.8 and platelets continuing to drop (46). Add on iron panel and differential. Dr. Marin Olp consulted and will see patient. Discontinued Eliquis. 2u PRBC.

## 2016-01-04 NOTE — Progress Notes (Signed)
CRITICAL VALUE ALERT  Critical value received:  Hgb: 6.4  Date of notification:  01/04/2016   Time of notification:  [id number]  Critical value read back:Yes.    Nurse who received alert:  Clint Lipps RN  MD notified (1st page):  Dr. Lonny Prude  Time of first page:  [phone]  MD notified (2nd page):  Time of second page:  Responding MD:  Dr. Lonny Prude  Time MD responded:  [phone]

## 2016-01-04 NOTE — Consult Note (Signed)
Consultation Note Date: 01/04/2016   Patient Name: Marcus [name]  DOB: [birth year]  MRN: [id number]  Age / Sex: 75 y.o., male  PCP: Estill Dooms, MD Referring Physician: Mariel Aloe, MD  Reason for Consultation: Establishing goals of care, Non pain symptom management, Pain control and Psychosocial/spiritual support  HPI/Patient Profile: 75 y.o. male  with past medical history of Atrial fibrillation, hypertension, diabetes, CVA, GI bleed admitted on 12/25/2015 with generalized weakness, failure to thrive, after a recent fall. He was initially admitted to the hospital on 12/14/2015 then was transferred to inpatient rehabilitation, and now is readmitted to the hospitalist service on 12/25/2015. His initial chest x-ray on 12/14/2015 revealed community-acquired pneumonia as well as findings of a right lower lobe mass, closed fracture of T7 vertebrae. Patient's biopsy of right lower lobe mass revealed inflammatory changes. 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 He is being treated with antibiotics. Despite supportive therapy, antibiotics extensive workup his mood has remained very labile, ongoing reports of pain, as well as inability to move his lower extremities.. He has been seen by psychiatry and felt to have capacity.  Additionally he has thrombocytopenia, platelets 62, and an albumin level of 1.5  Clinical Assessment and Goals of Care: When I entered the room patient is crying, tells me immediately that "I'm dying" let me go". He also tells me he has cancer. I did tell him that he has had a biopsy of the lung mass which initially did not show that he had cancer and attempted to allay his fears and calm him. His mood is very labile. His thought processes are disorganized  NEXT OF KIN his wife Christhian Prey    SUMMARY OF RECOMMENDATIONS   This is  a very complex case as patient appears to be very sensitive to medications but he is complaining of pain and seems to be in quite a bit of distress not only physical pain but psychological as well as spiritual pain. Start Zyprexa 2.5 mg at bedtime and a now dose, given the severity of his distress. I did staff this with Dr. Teryl Lucy and he was in agreement. Patient has resumed Ativan 0.5 daily as needed. I did speak to his wife on the phone and she verbalized that he was on this at home and in agreement with continuing this at a low dose We are planning to meet 01/05/2016 at 9:00 to further review his clinical status, history, as well as goals of care relative to patient and family Code Status/Advance Care Planning:  Full code    Symptom Management:   Pain: Patient would benefit from a pain management specialist particularly someone capable of offering interventional pain management. We'll continue with fentanyl IV as needed for now. His wife shares that gabapentin as well as Lyrica for neuropathic pain cause severe interactions  Mood lability: Trial of Zyprexa 2.5 mg at bedtime. Hopefully patient once his thinking is organized we can get a better feel for his clinical needs specifically his  pain level  Anxiety: Continue Ativan 0.5 mg daily as needed  Palliative Prophylaxis:   Delirium Protocol, Frequent Pain Assessment and Turn Reposition  Additional Recommendations (Limitations, Scope, Preferences):  Full Scope Treatment  Psycho-social/Spiritual:   Desire for further Chaplaincy support:no Prognosis:   Unable to determine  Discharge Planning: To Be Determined      Primary Diagnoses: Present on Admission: . T7 vertebral fracture ([place]) . Urinary retention . Pressure injury of skin . Paraplegia ([place]) . MSSA (methicillin susceptible Staphylococcus aureus) septicemia ([place]) . MRSA infection . Melena . Lower extremity weakness . Essential hypertension . Cerebrovascular accident  (CVA) due to stenosis of left carotid artery ([place]) . Acute blood loss anemia . Atrial fibrillation with RVR ([place]) . Inadequate pain control . Acute confusional state   I have reviewed the medical record, interviewed the patient and family, and examined the patient. The following aspects are pertinent.  Past Medical History:  Diagnosis Date  . Anxiety state, unspecified   . 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 Carotid artery disease ([place])    a. s/p L CEA 2016.  Marland Kitchen Complication of anesthesia    " I woke up in the recovery room with tube in throat and panicked."  . 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 Essential hypertension e  . Herpes zoster without mention of complication   . History of hiatal hernia   . Hypertrophy of prostate with urinary obstruction and other lower urinary tract symptoms (LUTS)   . Insomnia, unspecified   . Junctional bradycardia    a. developed this with digoxin 12/2015 requiring cessation.  . Lumbago   . Lung mass    a. 12/2015: right lower lobe mass felt secondary to MSSA. Biopsy of right lung mass revealed inflammation/fibrosis favoring inflammatory process but malignancy not entirely ruled out.  Marland Kitchen MSSA (methicillin susceptible Staphylococcus aureus) septicemia ([place]) 12/23/2015  . Other and unspecified hyperlipidemia   . 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 Paroxysmal atrial fibrillation ([place]) 12/14/2015   a. dx 12/2015, in/out on telemetry, digoxin stopped 2/2 junctional bradycardia.  . Pneumonia    bronchial  . Reflux esophagitis   . Seasonal allergies   . Stroke ([place])   . TIA (transient ischemic attack)   . Type II or unspecified type diabetes mellitus without mention of complication, not stated as uncontrolled   . Unspecified vitamin D deficiency   . Urethral stricture unspecified   . Urinary frequency    Social History   Social History  . Marital status: Married    Spouse name: N/A  . Number of children: N/A  .  Years of education: N/A   Social History Main Topics  . Smoking status: Former Smoker    Types: Cigarettes    Quit date: 01/01/1991  . Smokeless tobacco: Never Used     Comment: " Quit around 1986"  . Alcohol use No  . Drug use: No  . Sexual activity: Yes    Birth control/ protection: None   Other Topics Concern  . None   Social History Narrative  . None   Family History  Problem Relation Age of Onset  . Diabetes Mother   . Cancer Mother   . Hypertension Father   . Stroke Father   . Alcohol abuse Brother    Scheduled Meds: . sodium chloride   Intravenous Once  . apixaban  10 mg Oral BID   Followed by  . [START ON 01/10/2016] apixaban  5 mg Oral BID  . calcitonin (salmon)  1 spray Alternating Nares Daily  . collagenase   Topical Daily  . dexamethasone  4 mg Intravenous Q24H  . diltiazem  180 mg Oral Daily  . feeding supplement (PRO-STAT SUGAR FREE 64)  30 mL Oral TID BM  . fluconazole  100 mg Oral Daily  . insulin aspart  0-5 Units Subcutaneous QHS  . insulin aspart  0-9 Units Subcutaneous TID WC  . ipratropium-albuterol  3 mL Nebulization TID  . multivitamin with minerals  1 tablet Oral Daily  . OLANZapine  2.5 mg Oral QHS  . pantoprazole  40 mg Oral BID  . sodium chloride flush  3 mL Intravenous Q12H  . timolol  1 drop Both Eyes BID  . vancomycin  1,250 mg Intravenous Q12H   Continuous Infusions: . lactated ringers Stopped (12/29/15 0700)   PRN Meds:.acetaminophen **OR** acetaminophen, alum & mag hydroxide-simeth, feeding supplement (ENSURE ENLIVE), fentaNYL (SUBLIMAZE) injection, HYDROcodone-acetaminophen, LORazepam, magic mouthwash, nicotine polacrilex, ondansetron (ZOFRAN) IV, polyethylene glycol, promethazine **OR** promethazine **OR** promethazine, senna-docusate, sodium chloride flush, traZODone Medications Prior to Admission:  Prior to Admission medications   Medication Sig Start Date End Date Taking? Authorizing Provider  apixaban (ELIQUIS) 5 MG TABS  tablet Take 1 tablet (5 mg total) by mouth 2 (two) times daily. 12/24/15   Venetia Maxon Rama, MD  atorvastatin (LIPITOR) 40 MG tablet TAKE ONE TABLET BY MOUTH ONCE DAILY FOR CHOLESTEROL Patient taking differently: TAKE ONE TABLET BY MOUTH ONCE DAILY AT BEDTIME FOR CHOLESTEROL 11/20/15   Estill Dooms, MD  Cholecalciferol (VITAMIN D3) 5000 units TABS Take 5,000 Units by mouth at bedtime.    Historical Provider, MD  clopidogrel (PLAVIX) 75 MG tablet TAKE 1 TABLET BY MOUTH DAILY 07/04/15   Rosetta Posner, MD  dexamethasone (DECADRON) 10 MG/ML injection Inject 1 mL (10 mg total) into the vein every 6 (six) hours. 12/24/15   Venetia Maxon Rama, MD  diltiazem (CARDIZEM CD) 180 MG 24 hr capsule Take 1 capsule (180 mg total) by mouth daily. 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 12/24/15   Venetia Maxon Rama, MD  HYDROcodone-acetaminophen (NORCO/VICODIN) 5-325 MG tablet Take 0.5-1 tablets by mouth every 6 (six) hours as needed for moderate pain.     Historical Provider, MD  LORazepam (ATIVAN) 0.5 MG tablet TAKE 1 TABLET BY MOUTH 3 TIMES A DAY AS NEEDED 12/30/15   Estill Dooms, MD  nicotine polacrilex (COMMIT) 2 MG lozenge Take 2 mg by mouth every 2 (two) hours.     Historical Provider, MD  ondansetron (ZOFRAN) 4 MG tablet Take 1 tablet (4 mg total) by mouth every 8 (eight) hours as needed for nausea or vomiting. 12/02/15   Lauree Chandler, NP  polyethylene glycol (MIRALAX / GLYCOLAX) packet Take 17 g by mouth daily. 12/25/15   Christina P Rama, MD  senna-docusate (SENOKOT-S) 8.6-50 MG tablet Take 1 tablet by mouth 2 (two) times  daily. 12/24/15   Christina P Rama, MD  timolol (BETIMOL) 0.5 % ophthalmic solution Place 1 drop into both eyes 2 (two) times daily.  Historical Provider, MD  vancomycin 1,250 mg in sodium chloride 0.9 % 250 mL Inject 1,250 mg into the vein every 12 (twelve) hours. 12/24/15   Venetia Maxon Rama, MD   Allergies  Allergen Reactions  . Codeine Other (See Comments)    constipation  . Demerol [Meperidine] Other (See Comments)    Drops blood pressure   . Contrast Media [Iodinated Diagnostic Agents] Nausea And Vomiting  . Gabapentin Other (See Comments)    Suicidal, depression  . Other Other (See Comments)    All narcotics make pt hallucinate   Review of Systems  Unable to perform ROS: Mental status change    Physical Exam  Constitutional: He appears well-developed.  HENT:  Head: Normocephalic and atraumatic.  Cardiovascular: Normal rate.   Pulmonary/Chest: Effort normal.  Musculoskeletal:  New paraplegia  Neurological: He is alert.  Skin: Skin is warm and dry.  Psychiatric:  Mood labile, crying Thought processes disorganized Tells me he believes he is dying, and that he is suffering from pain and not being allowed dignity; he then alternates between crying, "please let me die"  Nursing note and vitals reviewed.   Vital Signs: BP (!) 155/96 (BP Location: Left Arm)   Pulse 76   Temp 97.6 F (36.4 C) (Oral)   Resp (!) 24   Wt 102.3 kg (225 lb 9.6 oz)   SpO2 99%   BMI 35.33 kg/m  Pain Assessment: 0-10 POSS *See Group Information*: S-Acceptable,Sleep, easy to arouse Pain Score: 9  (left side abdomen/ flank)   SpO2: SpO2: 99 % O2 Device:SpO2: 99 % O2 Flow Rate: .O2 Flow Rate (L/min): 1 L/min  IO: Intake/output summary:  Intake/Output Summary (Last 24 hours) at 01/04/16 1031 Last data filed at 01/04/16 0919  Gross per 24 hour  Intake              240 ml  Output             1400 ml  Net            -1160 ml    LBM: Last BM Date: 01/03/16 Baseline Weight: Weight: 94.1  kg (207 lb 8 oz) Most recent weight: Weight: 102.3 kg (225 lb 9.6 oz)     Palliative Assessment/Data:   Flowsheet Rows   Flowsheet Row Most Recent Value  Intake Tab  Referral Department  Hospitalist  Unit at Time of Referral  Med/Surg Unit  Palliative Care Primary Diagnosis  Sepsis/Infectious Disease  Date Notified  01/03/16  Palliative Care Type  New Palliative care  Reason for referral  Pain, Clarify Goals of Care, Psychosocial or Spiritual support, Non-pain Symptom  Date of Admission  12/14/15  Date first seen by Palliative Care  01/04/16  # of days Palliative referral response time  1 Day(s)  # of days IP prior to Palliative referral  20  Clinical Assessment  Palliative Performance Scale Score  30%  Pain Max last 24 hours  Not able to report  Pain Min Last 24 hours  Not able to report  Dyspnea Max Last 24 Hours  Not able to report  Dyspnea Min Last 24 hours  Not able to report  Nausea Max Last 24 Hours  Not able to report  Nausea Min Last 24 Hours  Not able to report  Anxiety Max Last 24 Hours  Not able to report  Other Max Last 24 Hours  Not able to report  Psychosocial & Spiritual Assessment  Palliative Care Outcomes  Palliative Care follow-up planned  Yes, Facility      Time In: 0900 Time Out: 1015 Time Total: 75 min Greater than 50%  of this time was spent counseling and coordinating care related to the above assessment and plan. Staffed with Dr. Teryl Lucy  Signed by: Dory Horn, NP   Please contact Palliative Medicine Team phone at [phone] for questions and concerns.  For individual provider: See Shea Evans

## 2016-01-04 NOTE — Progress Notes (Signed)
Pharmacy Antibiotic Note- Follow-up  Plan: Continue vancomycin 1250mg  IV q12h Monitor clinical progress, c/s, renal function Vancomycin trough as needed Continue fluconazole 100 mg daily - per MD   Assessment:  Marcus [name] is a 75 y.o. male admitted on 12/25/2015 with discitis, MSSA abscess/bacteremia, diagnosed 12/14/15. Antibiotics were switched to vancomycin on 10/22 with sputum cx growing MRSA. Plan per ID was to continue vancomycin x 2 weeks (originally ending 11/5) and switch to cefazolin to complete a 6-wk course (originally to be completed 01/29/16) - but had not had vancomycin since 10/25 for unclear reasons.   Pharmacy has been consulted for vancomycin dosing. Patient therapeutic on 1250mg  IV q12h with vancomycin trough today = 18 mcg/mL. Renal function stable and UOP marginal at 0.4 mL/kg/hr. Afebrile, wbc down to 2.1. Noted paraplegia.  Now day 3 of new course (vancomycin through November 15th  then transition to cefazolin 2 grams every 8 hours for 6 weeks from November 16th to December 27th).   Weight: 225 lb 9.6 oz (102.3 kg)  Temp (24hrs), Avg:98.3 F (36.8 C), Min:97.6 F (36.4 C), Max:99.2 F (37.3 C)   Recent Labs Lab 12/29/15 0453  12/30/15 0456  12/31/15 0815 01/01/16 0403 01/02/16 0450 01/03/16 0443 01/03/16 1351 01/04/16 0915  WBC  --   < > 14.3*  < >  --  10.7* 6.7 3.2* 3.9* 2.1*  CREATININE 0.67  --  0.69  --  0.63  --  0.51*  --   --  0.50*  VANCOTROUGH  --   --   --   --   --   --   --   --   --  18  < > = values in this interval not displayed.  Estimated Creatinine Clearance: 95.2 mL/min (by C-G formula based on SCr of 0.5 mg/dL (L)).    Allergies  Allergen Reactions  . Codeine Other (See Comments)    constipation  . Demerol [Meperidine] Other (See Comments)    Drops blood pressure   . Contrast Media [Iodinated Diagnostic Agents] Nausea And Vomiting  . Gabapentin Other (See Comments)    Suicidal, depression  . Other Other (See Comments)    All narcotics make pt hallucinate    Antimicrobials this admission:  10/14 CTX >> 10/15 10/14 Azithromycin >> 10/15 10/15 Cefazolin >> 10/22 10/22 Vancomycin >>off 10/25 per MD; 11/1>> 11/1 fluconazole x10 days >>   Dose adjustments this admission:  10/24 VT = 16 - con't vanc 1250 q12hr 11/4 VT = 18 - con't vanc at 1250 mg q12hr  Microbiology results:  10/14 BCx: BCID +MSSA 10/14 UCx: mssa  10/15 MRSA PCR: neg 10/17 BCx x 2: Neg 10/18 Sputum Cx: MRSA  10/18 Mediastinal mass cx> MSSA  Argie Ramming, PharmD Pharmacy Resident  Pager [phone] 01/04/16 11:22 AM.

## 2016-01-05 DIAGNOSIS — I82409 Acute embolism and thrombosis of unspecified deep veins of unspecified lower extremity: Secondary | ICD-10-CM

## 2016-01-05 DIAGNOSIS — A412 Sepsis due to unspecified staphylococcus: Secondary | ICD-10-CM

## 2016-01-05 DIAGNOSIS — R918 Other nonspecific abnormal finding of lung field: Secondary | ICD-10-CM

## 2016-01-05 LAB — CBC
HEMATOCRIT: 25.3 % — AB (ref 39.0–52.0)
Hemoglobin: 8.4 g/dL — ABNORMAL LOW (ref 13.0–17.0)
MCH: 30.1 pg (ref 26.0–34.0)
MCHC: 33.2 g/dL (ref 30.0–36.0)
MCV: 90.7 fL (ref 78.0–100.0)
Platelets: 35 10*3/uL — ABNORMAL LOW (ref 150–400)
RBC: 2.79 MIL/uL — AB (ref 4.22–5.81)
RDW: 17.7 % — ABNORMAL HIGH (ref 11.5–15.5)
WBC: 2 10*3/uL — AB (ref 4.0–10.5)

## 2016-01-05 LAB — GLUCOSE, CAPILLARY
GLUCOSE-CAPILLARY: 72 mg/dL (ref 65–99)
GLUCOSE-CAPILLARY: 161 mg/dL — AB (ref 65–99)
Glucose-Capillary: 72 mg/dL (ref 65–99)
Glucose-Capillary: 135 mg/dL — ABNORMAL HIGH (ref 65–99)

## 2016-01-05 LAB — SAVE SMEAR

## 2016-01-05 LAB — CK: Total CK: 84 U/L (ref 49–397)

## 2016-01-05 MED ORDER — POLYETHYLENE GLYCOL 3350 17 G PO PACK
17.0000 g | PACK | Freq: Two times a day (BID) | ORAL | Status: DC
  Administered 2016-01-05: 17 g via ORAL
  Filled 2016-01-05 (×2): qty 1

## 2016-01-05 MED ORDER — ALTEPLASE 2 MG IJ SOLR
2.0000 mg | Freq: Once | INTRAMUSCULAR | Status: AC
  Administered 2016-01-05: 2 mg
  Filled 2016-01-05: qty 2

## 2016-01-05 MED ORDER — SODIUM CHLORIDE 0.9 % IV SOLN
6.0000 mg/kg | INTRAVENOUS | Status: DC
  Administered 2016-01-05: 579.5 mg via INTRAVENOUS
  Filled 2016-01-05 (×2): qty 11.59

## 2016-01-05 MED ORDER — OLANZAPINE 5 MG PO TABS
5.0000 mg | ORAL_TABLET | Freq: Every day | ORAL | Status: DC
  Administered 2016-01-05 – 2016-01-09 (×5): 5 mg via ORAL
  Filled 2016-01-05 (×6): qty 1

## 2016-01-05 MED ORDER — FENTANYL 50 MCG/HR TD PT72
50.0000 ug | MEDICATED_PATCH | TRANSDERMAL | Status: DC
  Administered 2016-01-05: 50 ug via TRANSDERMAL
  Filled 2016-01-05: qty 1

## 2016-01-05 NOTE — Progress Notes (Addendum)
PROGRESS NOTE    [name]  [id number] DOB: 1940-06-23 DOA: 12/25/2015 PCP: Jeanmarie Hubert, MD   Brief Narrative: Marcus [name] is a 75 y.o. 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   Assessment & Plan:   Principal Problem:   Acute confusional state Active Problems:   Essential hypertension   Cerebrovascular accident (CVA) due to stenosis of left carotid artery ([facility])   Atrial fibrillation with RVR ([facility])   Urinary retention   Lower extremity weakness   T7 vertebral fracture ([facility])   Pressure injury of skin   MRSA infection   MSSA (methicillin susceptible Staphylococcus aureus) septicemia ([facility])   Paraplegia ([facility])   Acute blood loss anemia   Melena   Gastrointestinal hemorrhage associated with duodenal ulcer   Myopathy   Other abnormalities of gait and mobility   Leukocytosis   Inadequate pain control   Compression fracture of thoracic spine, non-traumatic ([facility])   Palliative care encounter   Symptomatic anemia Likely secondary to GI bleed secondary upper GI pathology. Suspect steroids have maintained a persistent ulcer which, with the addition of Eliquis, has been bleeding. S/p 4u PRBC. EGD on 11/1 significant for nonbleeding ulcer. Dropped again (7) and patient given 1u PRBC on 11/4 with improvement to 8.4 -daily CBC -Gastroenterology recommendations: signed off. Outpatient follow-up  ?Confusion Patient back to baseline from when I have seen him. Still concerning for change in baseline from previous. Refused CT scan -ativan prn daily -follow-up palliative care recommendations for pain management: fentanyl patch  GI bleed Initial EGD significant for non-bleeding ulcers. Embolization was not performed by IR secondary to negative mesenteric arteriogram. Hemoglobin dropping. No evidence of bleeding. -plavix stopped -continue protonix -continue to wean  decadron in setting of significant GI ulceration  Esophageal candidiasis Seen on EGD -continue diflucan for 10 day course -magic mouthwash  Paraplegia secondary to T7 compression fracture Discitis Abscess secondary to MSSA with bacteremia Patient started on antibiotics prior to admission. Patient has not had vancomycin since 10/25. Unsure of what happened. -continue 2 weeks of vancomycin with switch to cafazolin to complete 6 weeks of antibiotics per ID recommendations. Restart vancomycin -new ID recommendations: Vancomycin until 11/15, then Cefazolin 2g q8 hours from 11/16 to 12/27 -transition to fentanyl patch -will consider re-adding Lyrica at some point -vancomycin switched to Daptomycin per  Heme/onc  Leukocytosis Likely reactive secondary to stress from recent procedures. Patient afebrile. Resolved.  Thrombocytopenia Patient not on heparin or lovenox. Stable. Not bleeding. -hematology recommendations -path review  Leukopenia Differential with normal ANC -path review  Urinary retention -Urology recommendations: follow-up with urologist in 1-2 weeks for foley change  Atrial fibrillation with RVR Currently rate controlled -cardiology recommendations: signed off -continue diltiazem -Eliquis for anticoagulation discontinued  Bilateral LE DVT Eliquis restarted  Anxiety Likely depression Insomnia -continue ativan prn daily -continue trazodone -Zyprexa started per palliative care recommendations  Severe protein calorie malnutrition -nutrition recommendations -Ensure  History of TIA/CVA -plavix dc'd secondary to GI bleed  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 Acute DVT with continuing anemia Code Status: Full code Family Communication: None at bedside Disposition Plan: Discharge to inpatient rehab when medically ready   Consultants:    GI  Cardiology  Interventional Radiology  Infectious Diseases  General Surgery  Hematology  Procedures:  EGD  Mesenteric angiogram  EGD (01/01/2016)  Antimicrobials:  Vancomycin  Cefazolin  Daptomycin   Subjective: Patient states his pain is improved today. No other complaints.  Objective: Vitals:   01/04/16 2210 01/05/16 0005 01/05/16 0400 01/05/16 0847  BP:  126/68 123/81   Pulse:  68 63   Resp:  16 17   Temp: 97.9 F (36.6 C) 97.8 F (36.6 C) 97.8 F (36.6 C)   TempSrc: Oral     SpO2:  99% 97% 98%  Weight:   96.6 kg (213 lb)     Intake/Output Summary (Last 24 hours) at 01/05/16 1210 Last data filed at 01/05/16 0400  Gross per 24 hour  Intake              405 ml  Output             1950 ml  Net            -1545 ml   Filed Weights   01/03/16 0439 01/04/16 0455 01/05/16 0400  Weight: 102.9 kg (226 lb 14.4 oz) 102.3 kg (225 lb 9.6 oz) 96.6 kg (213 lb)    Examination:  General exam: Appears calm and comfortable  Respiratory system: Clear and diminished to auscultation. Respiratory effort normal. Cardiovascular system: S1 & S2 heard, RRR. No murmurs, rubs, gallops or clicks. Gastrointestinal system: Abdomen is nondistended, soft and nontender. Normal bowel sounds heard. Central nervous system: Alert and oriented. CN intact. Minimal sensation in bilateral lower extremities. 0/5 LE strength bilaterally Extremities: No calf tenderness. 2+ edema in bilateral legs Skin: No cyanosis. No rashes Psychiatry: Judgement and insight appear normal. Mood & affect depressed. Frequent bouts of crying    Data Reviewed: I have personally reviewed following labs and imaging studies  CBC:  Recent Labs Lab 01/03/16 1351 01/04/16 0915 01/04/16 1457 01/04/16 1757 01/05/16 0716  WBC 3.9* 2.1* 1.8* 1.9* 2.0*  NEUTROABS  --   --  1.6*  --   --   HGB 8.1* 7.6* 6.4* 7.0* 8.4*  HCT 25.1* 23.5* 19.6* 21.6* 25.3*  MCV 94.4 94.0 94.2 94.3 90.7  PLT 62* 54* 46*  47* 35*   Basic Metabolic Panel:  Recent Labs Lab 12/30/15 0456 12/31/15 0815 01/02/16 0450 01/04/16 0915  NA 138 138 138 140  K 4.2 4.3 4.2 3.8  CL 110 110 109 111  CO2 24 24 25 24   GLUCOSE 116* 114* 111* 109*  BUN 31* 27* 21* 22*  CREATININE 0.69 0.63 0.51* 0.50*  CALCIUM 7.2* 7.3* 7.1* 7.4*  MG 2.1  --   --   --   PHOS 2.9  --   --   --    GFR: Estimated Creatinine Clearance: 92.4 mL/min (by C-G formula based on SCr of 0.5 mg/dL (L)). Liver Function Tests:  Recent Labs Lab 12/30/15 0456 12/31/15 0815  AST 26 32  ALT 21 26  ALKPHOS 53 59  BILITOT 0.8 0.5  PROT 3.8* 3.7*  ALBUMIN 1.6* 1.5*   No results for input(s): LIPASE, AMYLASE in the last 168 hours.  Recent Labs Lab 01/04/16 0915  AMMONIA 22   Coagulation Profile:  Recent Labs Lab 12/29/15 2030  INR 1.16   Cardiac Enzymes: No results for input(s): CKTOTAL, CKMB, CKMBINDEX, TROPONINI in the last 168 hours. BNP (last 3 results) No results for input(s): PROBNP in the last 8760 hours. HbA1C: No results for input(s): HGBA1C in the last 72 hours. CBG:  Recent Labs Lab 01/04/16 1113 01/04/16 1611 01/04/16 2154 01/05/16 0745 01/05/16 1146  GLUCAP 98 95 90 72 72  Lipid Profile: No results for input(s): CHOL, HDL, LDLCALC, TRIG, CHOLHDL, LDLDIRECT in the last 72 hours. Thyroid Function Tests:  Recent Labs  01/04/16 0915  TSH 2.037   Anemia Panel:  Recent Labs  01/04/16 1507 01/04/16 1615  FERRITIN  --  1,279*  TIBC  --  155*  IRON  --  46  RETICCTPCT 3.3*  --    Sepsis Labs: No results for input(s): PROCALCITON, LATICACIDVEN in the last 168 hours.  No results found for this or any previous visit (from the past 240 hour(s)).       Radiology Studies: No results found.      Scheduled Meds: . sodium chloride   Intravenous Once  . sodium chloride   Intravenous Once  . calcitonin (salmon)  1 spray Alternating Nares Daily  . collagenase   Topical Daily  . DAPTOmycin  (CUBICIN)  IV  6 mg/kg Intravenous Q24H  . dexamethasone  4 mg Intravenous Q24H  . diltiazem  180 mg Oral Daily  . feeding supplement (PRO-STAT SUGAR FREE 64)  30 mL Oral TID BM  . fentaNYL  50 mcg Transdermal Q72H  . fluconazole  100 mg Oral Daily  . insulin aspart  0-5 Units Subcutaneous QHS  . insulin aspart  0-9 Units Subcutaneous TID WC  . ipratropium-albuterol  3 mL Nebulization TID  . multivitamin with minerals  1 tablet Oral Daily  . OLANZapine  5 mg Oral QHS  . pantoprazole  40 mg Oral BID  . sodium chloride flush  3 mL Intravenous Q12H  . timolol  1 drop Both Eyes BID   Continuous Infusions: . lactated ringers Stopped (12/29/15 0700)     LOS: 11 days     Cordelia Poche [facility] 01/05/2016, 12:10 PM Pager: [phone]  If 7PM-7AM, please contact night-coverage [email or web address] Password [email or web address] 01/05/2016, 12:10 PM

## 2016-01-05 NOTE — Progress Notes (Addendum)
Daily Progress Note   Patient Name: Marcus [name]       Date: 01/05/2016 DOB: June 11, 1940  Age: 75 y.o. MRN#: [id number] Attending Physician: Mariel Aloe, MD Primary Care Physician: Jeanmarie Hubert, MD Admit Date: 12/25/2015  Reason for Consultation/Follow-up: Establishing goals of care, Non pain symptom management, Pain control and Psychosocial/spiritual support  Subjective: Started Zyprexa 2.5 mg at bedtime on 01/04/2016. In addition to his nighttime dose he did receive a now order in the afternoon on 01/04/2016. Per his nurse he did rest for several hours. There is no undue sedation noted this morning. His nurse reports that he slept well last night. He still is not eating. His mood remains very anxious tearful labile. His thought processes are disorganized, flight of ideas are present. He continues to verbalize severe pain but at this point he is unable to describe the pain located I did meet with his wife Neoma Laming today for goals of care discussion and review of clinical data  Length of Stay: 11  Current Medications: Scheduled Meds:  . sodium chloride   Intravenous Once  . sodium chloride   Intravenous Once  . calcitonin (salmon)  1 spray Alternating Nares Daily  . collagenase   Topical Daily  . DAPTOmycin (CUBICIN)  IV  6 mg/kg Intravenous Q24H  . dexamethasone  4 mg Intravenous Q24H  . diltiazem  180 mg Oral Daily  . feeding supplement (PRO-STAT SUGAR FREE 64)  30 mL Oral TID BM  . fentaNYL  50 mcg Transdermal Q72H  . fluconazole  100 mg Oral Daily  . insulin aspart  0-5 Units Subcutaneous QHS  . insulin aspart  0-9 Units Subcutaneous TID WC  . ipratropium-albuterol  3 mL Nebulization TID  . multivitamin with minerals  1 tablet Oral Daily  . OLANZapine  5 mg Oral QHS  .  pantoprazole  40 mg Oral BID  . polyethylene glycol  17 g Oral BID  . sodium chloride flush  3 mL Intravenous Q12H  . timolol  1 drop Both Eyes BID    Continuous Infusions: . lactated ringers Stopped (12/29/15 0700)    PRN Meds: acetaminophen **OR** acetaminophen, alum & mag hydroxide-simeth, feeding supplement (ENSURE ENLIVE), fentaNYL (SUBLIMAZE) injection, LORazepam, magic mouthwash, nicotine polacrilex, ondansetron (ZOFRAN) IV, promethazine **OR** promethazine **OR** promethazine, senna-docusate, sodium chloride flush, traZODone  Physical Exam  Constitutional: He appears well-developed and well-nourished.  HENT:  Head: Normocephalic and atraumatic.  Cardiovascular: Normal rate and regular rhythm.   Pulmonary/Chest: Effort normal.  Abdominal: Soft.  Genitourinary:  Genitourinary Comments: Foley catheter  Musculoskeletal:  Normal range of motion to his upper extremities Is moving his left foot  Neurological: He is alert.  Oriented to self, recognizes his family  Skin: Skin is warm and dry.  Psychiatric:  Patient is anxious and agitated, thought processes are disorganized He is crying jumping from topic to topic. Unable to console him  Nursing note and vitals reviewed.           Vital Signs: BP 123/81   Pulse 63   Temp 97.8 F (36.6 C)   Resp 17   Ht 5\' 7"  (1.702 m)   Wt 96.6 kg (213 lb)   SpO2 92%   BMI 33.36 kg/m  SpO2: SpO2: 92 % O2 Device: O2 Device: Not Delivered O2 Flow Rate: O2 Flow Rate (L/min): 1 L/min  Intake/output summary:  Intake/Output Summary (Last 24 hours) at 01/05/16 1517 Last data filed at 01/05/16 0400  Gross per 24 hour  Intake              405 ml  Output             1950 ml  Net            -1545 ml   LBM: Last BM Date: 01/03/16 Baseline Weight: Weight: 94.1 kg (207 lb 8 oz) Most recent weight: Weight: 96.6 kg (213 lb)       Palliative Assessment/Data:    Flowsheet Rows   Flowsheet Row Most Recent Value  Intake Tab  Referral  Department  Hospitalist  Unit at Time of Referral  Med/Surg Unit  Palliative Care Primary Diagnosis  Sepsis/Infectious Disease  Date Notified  01/03/16  Palliative Care Type  New Palliative care  Reason for referral  Pain, Clarify Goals of Care, Psychosocial or Spiritual support, Non-pain Symptom  Date of Admission  12/14/15  Date first seen by Palliative Care  01/04/16  # of days Palliative referral response time  1 Day(s)  # of days IP prior to Palliative referral  20  Clinical Assessment  Palliative Performance Scale Score  30%  Pain Max last 24 hours  Not able to report  Pain Min Last 24 hours  Not able to report  Dyspnea Max Last 24 Hours  Not able to report  Dyspnea Min Last 24 hours  Not able to report  Nausea Max Last 24 Hours  Not able to report  Nausea Min Last 24 Hours  Not able to report  Anxiety Max Last 24 Hours  Not able to report  Other Max Last 24 Hours  Not able to report  Psychosocial & Spiritual Assessment  Palliative Care Outcomes  Palliative Care follow-up planned  Yes, Facility      Patient Active Problem List   Diagnosis Date Noted  . Compression fracture of thoracic spine, non-traumatic ([place])   . Palliative care encounter   . Inadequate pain control 01/02/2016  . Acute confusional state 01/02/2016  . Leukocytosis   . Myopathy   . Other abnormalities of gait and mobility   . Left leg DVT ([place]) 12/25/2015  . Acute blood loss anemia 12/25/2015  . Gastrointestinal hemorrhage associated with duodenal ulcer 12/25/2015  . Melena   . 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 Paraplegia ([place]) 12/24/2015  . Neurogenic  bowel   . Neurogenic bladder   . Hyperlipidemia   . 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 MRSA infection 12/23/2015  . MSSA (methicillin susceptible Staphylococcus aureus) septicemia ([place]) 12/23/2015  . Pressure injury of skin 12/19/2015  . Closed fracture of seventh thoracic vertebra ([place])   . T7 vertebral fracture ([place])   . Decreased  sensation   . Atrial fibrillation with RVR ([place]) 12/14/2015  . Fall 12/14/2015  . Community acquired pneumonia of left lung 12/14/2015  . Compression fracture of vertebra ([place]) 12/14/2015  . Urinary retention 12/14/2015  . PAF (paroxysmal atrial fibrillation) ([place]) 12/14/2015  . Lung mass 12/14/2015  . Lower extremity weakness 12/14/2015  . SOB (shortness of breath) 11/12/2015  . Chest pain 11/12/2015  . S/P carotid endarterectomy 04/10/2015  . 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 Glaucoma 03/20/2015  . Diabetes ([place]) 10/10/2014  . HLD (hyperlipidemia) 10/10/2014  . 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 Essential hypertension   . Carotid stenosis   . TIA (transient ischemic attack) 06/01/2014  . Malaise and fatigue 11/08/2013  . Lumbago   . Benign essential tremor   . Vitamin D deficiency     Palliative Care Assessment & Plan   Patient Profile: 75 y.o. male  with past medical history of Atrial fibrillation, hypertension, diabetes, CVA, GI bleed admitted on 12/25/2015 with generalized weakness, failure to thrive, after a recent fall. He was initially admitted to the hospital on 12/14/2015 then was transferred to inpatient rehabilitation, and now is readmitted to the hospitalist service on 12/25/2015. His initial chest x-ray on 12/14/2015 revealed community-acquired pneumonia as well as findings of a right lower lobe mass, closed fracture of T7 vertebrae. Patient's biopsy of right lower lobe mass revealed inflammatory changes. 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 He is being treated with antibiotics. Despite supportive therapy, antibiotics extensive workup his mood has remained very labile, ongoing reports of pain, as well as inability to move his lower extremities.. He has been seen by psychiatry and felt to have capacity.  Additionally  he has thrombocytopenia, platelets 62, and an albumin level of 1.5 On 11 07/02/2015 he has developed pancytopenia, white blood cell count 1.9; hemoglobin 7.0 after being transfused 1 unit (was 6.4 on 01/04/2016), platelets 47. Oncology consult placed to Dr. Burney Gauze who did see patient on 01/05/2016   Recommendations/Plan:  Spoke to wife at length regarding goals of care specifically CODE STATUS, definition of terms of full code, DO NOT RESUSCITATE as well as clinical data, pending oncology consult  Wife understands terms of full code versus DO NOT RESUSCITATE and wishes her husband to remain a full code. She does understand that she may have to make a decision if he is unable to wean off of the ventilator on his own to terminally extubate him. If the worse case scenario were to happen and he did require intubation and could not extubate on his own  she would not want a trach or a PEG  Pain: We'll start a fentanyl transdermal patch at 50 g every 72 hours and fentanyl IV 50 g every 2 hours as needed for breakthrough pain we'll discontinue the Norco  Acute confusional state/delirium: We'll uptitrate Zyprexa to 5 mg daily at bedtime  Anxiety: Continue Ativan 0.5 mg daily as needed. If other medications are tolerated may consider increasing this as patient is extremely anxious  and overwhelmed  Goals of Care and Additional Recommendations:  Limitations on Scope of Treatment: No Tracheostomy  Code Status:    Code Status Orders        Start     Ordered   12/25/15 1649  Full code  Continuous     12/25/15 1654    Code Status History    Date Active Date Inactive Code Status Order ID Comments User Context   12/25/2015  4:54 PM 12/25/2015 10:00 PM Full Code [id number]  Waldemar Dickens, MD Inpatient   12/24/2015  6:15 PM 12/24/2015  6:15 PM Full Code [id number]  Cathlyn Parsons, PA-C Inpatient   12/24/2015  6:15 PM 12/25/2015  4:54 PM Full Code [id number]  Cathlyn Parsons, PA-C  Inpatient   12/14/2015  3:36 PM 12/24/2015  6:03 PM Full Code [id number]  Radene Gunning, NP ED   06/08/2014  2:27 PM 06/10/2014 12:47 PM Full Code [id number]  Alvia Grove, PA-C Inpatient   06/01/2014  4:00 PM 06/03/2014  7:26 PM Full Code [id number]  Samella Parr, NP Inpatient    Advance Directive Documentation   Flowsheet Row Most Recent Value  Type of Advance Directive  Living will  Pre-existing out of facility DNR order (yellow form or pink MOST form)  No data  "MOST" Form in Place?  No data       Prognosis:   Unable to determine  Discharge Planning:  To Be Determined  Care plan was discussed with Dr. Lonny Prude and Dr Marin Olp  Thank you for allowing the Palliative Medicine Team to assist in the care of this patient.   Time In: 0900 Time Out: 1000 Total Time 60 min Prolonged Time Billed  no       Greater than 50%  of this time was spent counseling and coordinating care related to the above assessment and plan.  Dory Horn, NP  Please contact Palliative Medicine Team phone at [phone] for questions and concerns.

## 2016-01-05 NOTE — Progress Notes (Signed)
4 Days Post-Op  Subjective: I was asked to revisit Marcus [name] for his history of urethral stricture disease and difficult foley placement.  He is doing well with the current catheter which was placed on 10/14.   He has had no hematuria or catheter complications.  He has had no neurologic recovery from his T7 fracture.  ROS:  Review of Systems  Constitutional: Negative for chills and fever.  Genitourinary: Negative.   Neurological:       No change in paralysis.     Anti-infectives: Anti-infectives    Start     Dose/Rate Route Frequency Ordered Stop   01/03/16 1000  fluconazole (DIFLUCAN) tablet 100 mg     100 mg Oral Daily 01/03/16 0911     01/02/16 1000  fluconazole (DIFLUCAN) IVPB 100 mg  Status:  Discontinued     100 mg 50 mL/hr over 60 Minutes Intravenous Every 24 hours 01/02/16 0959 01/03/16 0911   01/01/16 2200  vancomycin (VANCOCIN) 1,250 mg in sodium chloride 0.9 % 250 mL IVPB     1,250 mg 166.7 mL/hr over 90 Minutes Intravenous Every 12 hours 01/01/16 2147     01/01/16 1230  fluconazole (DIFLUCAN) tablet 100 mg  Status:  Discontinued     100 mg Oral Daily 01/01/16 1124 01/02/16 0959   12/25/15 1700  vancomycin (VANCOCIN) 1,250 mg in sodium chloride 0.9 % 250 mL IVPB  Status:  Discontinued     1,250 mg 166.7 mL/hr over 90 Minutes Intravenous Every 12 hours 12/25/15 1654 12/25/15 1931      Current Facility-Administered Medications  Medication Dose Route Frequency Provider Last Rate Last Dose  . 0.9 %  sodium chloride infusion   Intravenous Once Nelida Meuse III, MD      . 0.9 %  sodium chloride infusion   Intravenous Once Mariel Aloe, MD      . acetaminophen (TYLENOL) tablet 1,000 mg  1,000 mg Oral Q6H PRN Waldemar Dickens, MD       Or  . acetaminophen (TYLENOL) suppository 650 mg  650 mg Rectal Q6H PRN Waldemar Dickens, MD      . alum & mag hydroxide-simeth (MAALOX/MYLANTA) 200-200-20 MG/5ML suspension 30 mL  30 mL Oral PRN Bertram Savin Sheikh, DO   30 mL at 12/31/15  1350  . calcitonin (salmon) (MIACALCIN/FORTICAL) nasal spray 1 spray  1 spray Alternating Nares Daily Waldemar Dickens, MD   1 spray at 12/28/15 [phone]  . collagenase (SANTYL) ointment   Topical Daily [place], DO      . dexamethasone (DECADRON) injection 4 mg  4 mg Intravenous Q24H Mariel Aloe, MD   4 mg at 01/04/16 1023  . diltiazem (CARDIZEM CD) 24 hr capsule 180 mg  180 mg Oral Daily [place], DO   180 mg at 01/04/16 1024  . feeding supplement (ENSURE ENLIVE) (ENSURE ENLIVE) liquid 237 mL  237 mL Oral BID PRN [place], DO      . feeding supplement (PRO-STAT SUGAR FREE 64) liquid 30 mL  30 mL Oral TID BM Mariel Aloe, MD   30 mL at 01/04/16 2040  . fentaNYL (SUBLIMAZE) injection 12.5-50 mcg  12.5-50 mcg Intravenous Q2H PRN Bertram Savin Sheikh, DO   50 mcg at 01/05/16 0102  . fluconazole (DIFLUCAN) tablet 100 mg  100 mg Oral Daily Mariel Aloe, MD   100 mg at 01/04/16 1024  . HYDROcodone-acetaminophen (NORCO) 7.5-325 MG per tablet 1 tablet  1  tablet Oral Q6H PRN Kerney Elbe, DO   1 tablet at 01/04/16 2354  . insulin aspart (novoLOG) injection 0-5 Units  0-5 Units Subcutaneous QHS Waldemar Dickens, MD      . insulin aspart (novoLOG) injection 0-9 Units  0-9 Units Subcutaneous TID WC Waldemar Dickens, MD   1 Units at 01/04/16 [phone]  . ipratropium-albuterol (DUONEB) 0.5-2.5 (3) MG/3ML nebulizer solution 3 mL  3 mL Nebulization TID Bertram Savin Sheikh, DO   3 mL at 01/04/16 2004  . lactated ringers infusion   Intravenous Continuous Nelida Meuse III, MD   Stopped at 12/29/15 0700  . LORazepam (ATIVAN) tablet 0.5 mg  0.5 mg Oral Daily PRN Mariel Aloe, MD   0.5 mg at 01/03/16 0953  . magic mouthwash  10 mL Oral QID PRN Mariel Aloe, MD      . multivitamin with minerals tablet 1 tablet  1 tablet Oral Daily Mariel Aloe, MD   1 tablet at 01/04/16 1024  . nicotine polacrilex (COMMIT) lozenge 2 mg  2 mg Oral PRN Mariel Aloe, MD      . OLANZapine [facility])  tablet 2.5 mg  2.5 mg Oral QHS Dory Horn, NP   2.5 mg at 01/04/16 2208  . ondansetron (ZOFRAN) injection 4 mg  4 mg Intravenous Q6H PRN Vena Rua, PA-C   4 mg at 01/04/16 2039  . pantoprazole (PROTONIX) EC tablet 40 mg  40 mg Oral BID Vena Rua, PA-C   40 mg at 01/04/16 2208  . polyethylene glycol (MIRALAX / GLYCOLAX) packet 17 g  17 g Oral Daily PRN Waldemar Dickens, MD   17 g at 01/04/16 0000  . promethazine (PHENERGAN) tablet 25 mg  25 mg Oral Q6H PRN Waldemar Dickens, MD       Or  . promethazine (PHENERGAN) injection 12.5 mg  12.5 mg Intravenous Q6H PRN Waldemar Dickens, MD   12.5 mg at 12/26/15 [id number]   Or  . promethazine (PHENERGAN) suppository 25 mg  25 mg Rectal Q6H PRN Waldemar Dickens, MD      . senna-docusate (Senokot-S) tablet 1 tablet  1 tablet Oral BID PRN Waldemar Dickens, MD   1 tablet at 01/04/16 0211  . sodium chloride flush (NS) 0.9 % injection 10-40 mL  10-40 mL Intracatheter PRN Bertram Savin Sheikh, DO      . sodium chloride flush (NS) 0.9 % injection 3 mL  3 mL Intravenous Q12H Waldemar Dickens, MD   3 mL at 01/04/16 2200  . timolol (TIMOPTIC) 0.5 % ophthalmic solution 1 drop  1 drop Both Eyes BID Waldemar Dickens, MD   1 drop at 01/04/16 2208  . traZODone (DESYREL) tablet 50 mg  50 mg Oral QHS PRN,MR X 1 Omair Latif Sheikh, DO   50 mg at 01/02/16 2207  . vancomycin (VANCOCIN) 1,250 mg in sodium chloride 0.9 % 250 mL IVPB  1,250 mg Intravenous Q12H Romona Curls, RPH   1,250 mg at 01/04/16 2208     Objective: Vital signs in last 24 hours: Temp:  [97.6 F (36.4 C)-98.4 F (36.9 C)] 97.8 F (36.6 C) (11/05 0400) Pulse Rate:  [63-77] 63 (11/05 0400) Resp:  [16-24] 17 (11/05 0400) BP: (108-155)/(68-96) 123/81 (11/05 0400) SpO2:  [95 %-100 %] 97 % (11/05 0400) Weight:  [96.6 kg (213 lb)] 96.6 kg (213 lb) (11/05 0400)  Intake/Output from previous day: 11/04 0701 - 11/05 0700 In:  405 [I.V.:40; Blood:365] Out: 2350 [Urine:2350] Intake/Output this shift: No  intake/output data recorded.   Physical Exam  Constitutional:  WD, WN paraplegic in NAD.  Genitourinary:  Genitourinary Comments: Foley in good position with no meatal erosion and normal scrotum and contents.   Neurological:  Paraplegic.   Vitals reviewed.   Lab Results:   Recent Labs  01/04/16 1457 01/04/16 1757  WBC 1.8* 1.9*  HGB 6.4* 7.0*  HCT 19.6* 21.6*  PLT 46* 47*   BMET  Recent Labs  01/04/16 0915  NA 140  K 3.8  CL 111  CO2 24  GLUCOSE 109*  BUN 22*  CREATININE 0.50*  CALCIUM 7.4*   PT/INR No results for input(s): LABPROT, INR in the last 72 hours. ABG No results for input(s): PHART, HCO3 in the last 72 hours.  Invalid input(s): PCO2, PO2  Studies/Results: No results found.   Assessment and Plan: Urinary retention from spinal shock with no change in neurologic status. History of urethral stricture with a false passage and difficult foley placement.  The foley will need to be changed in 1-2 weeks.  I will notify his primary urologist about this.         LOS: 11 days    Marcus [name] 01/05/2016 [id number] ID: Phebe Colla, male   DOB: 1940/12/29, 75 y.o.   MRN: [id number]

## 2016-01-05 NOTE — Consult Note (Addendum)
Referral MD  Reason for Referral: pancytopenia, RLL lung mass, T7 compression fx, MSRA/MSSA in blood  No chief complaint on file. : I do not want anything else done to me.  HPI: Marcus [name] is a 75 year old white male. He has an incredibly complicated history. I will not restate all this. Basically, he has atrial fibrillation, diabetes, hypertension.  He has been hospitalized for several weeks. He has been in the inpatient rehabilitation unit. 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 Blood cultures have shown staph which is both MRSA and methicillin Sensitive staph aureus.  He has had biopsies. Biopsy of a right lower lobe lung mass. This was seen on CT scan. This came back inflammatory.  His blood counts have been dropping. He has had thrombus in his legs. He has a clot in the right soleal vein and left popliteal vein. He was placed on anticoagulation. He has had GI bleeding. He has had an upper GI which showed some ulcers.  A filter has not been placed in the IVC for his blood clots in the leg. I he definitely needs this done.  His platelet was dropped. They were pretty much normal back on 1025. At that point, his white cell count was 9.3. Hemoglobin 8.7 and platelet count 206,000.  He is placed on some steroids. This is for inflammation. On the 28th, his white cell count 12.2. Hemoglobin 9.3. Platelet 145,000.   I'm not sure when the vancomycin was started.  Marcus Haw, MD  Marcus [name]  On November 4, his white count 1.8. Hemoglobin 6.4 platelet count 46,000. His corrected reticking site count was probably 1. Today, his hemoglobin is 8.4. Platelet count 35,000. White cell count 2000.  He is confused. He is angry. He says he started having test done and not knowing what is wrong.  He has had no obvious rashes. He can move his toes a little bit.  Of course he has been on numerous medications.  We are asked to see him because people are very concerned AST has  cancer.  He has some pain issues. I recommended a fentanyl patch. This I would be a good idea. He is not taking his medications by mouth.  I asked he and his family to pray. They eagerly wanted to pray. With very good prayer session.  I'll try and find the blood smear. One had to be done on November 4.  It is hard to say how much she is really eating.              Past Medical History:  Diagnosis Date  . Anxiety state, unspecified   . 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 Carotid artery disease ([place])    a. s/p L CEA 2016.  Marland Kitchen Complication of anesthesia    " I woke up in the recovery room with tube in throat and panicked."  . 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 Essential hypertension e  . Herpes zoster without mention of complication   . History of hiatal hernia   . Hypertrophy of prostate with urinary obstruction and other lower urinary tract symptoms (LUTS)   . Insomnia, unspecified   . Junctional bradycardia    a. developed this with digoxin 12/2015 requiring cessation.  . Lumbago   . Lung mass    a. 12/2015: right lower lobe mass felt secondary to MSSA. Biopsy of right lung mass revealed inflammation/fibrosis favoring inflammatory process but malignancy not entirely ruled out.  Marland Kitchen  MSSA (methicillin susceptible Staphylococcus aureus) septicemia ([place]) 12/23/2015  . Other and unspecified hyperlipidemia   . 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 Paroxysmal atrial fibrillation ([place]) 12/14/2015   a. dx 12/2015, in/out on telemetry, digoxin stopped 2/2 junctional bradycardia.  . Pneumonia    bronchial  . Reflux esophagitis   . Seasonal allergies   . Stroke ([place])   . TIA (transient ischemic attack)   . Type II or unspecified type diabetes mellitus without mention of complication, not stated as uncontrolled   . Unspecified vitamin D deficiency   . Urethral stricture unspecified   . Urinary frequency   :  Past Surgical History:  Procedure  Laterality Date  . CARDIAC CATHETERIZATION  1986   normal, Dr Lia Foyer  . ENDARTERECTOMY Left 06/08/2014   Procedure: LEFT CAROTID ARTERY ENDARTERECTOMY;  Surgeon: Rosetta Posner, MD;  Location: [facility];  Service: Vascular;  Laterality: Left;  . ESOPHAGOGASTRODUODENOSCOPY N/A 12/27/2015   Procedure: ESOPHAGOGASTRODUODENOSCOPY (EGD);  Surgeon: Doran Stabler, MD;  Location[facility] ENDOSCOPY;  Service: Endoscopy;  Laterality: N/A;  . ESOPHAGOGASTRODUODENOSCOPY N/A 01/01/2016   Procedure: ESOPHAGOGASTRODUODENOSCOPY (EGD);  Surgeon: Ladene Artist, MD;  Location: [facility] ENDOSCOPY;  Service: Endoscopy;  Laterality: N/A;  . EYE SURGERY  2014   cataract  . EYE SURGERY  2013   Retina  . IR GENERIC HISTORICAL  12/27/2015   IR ANGIOGRAM VISCERAL SELECTIVE 12/27/2015 Arne Cleveland, MD [facility]  . IR GENERIC HISTORICAL  12/27/2015   IR US GUIDE VASC ACCESS RIGHT 12/27/2015 Arne Cleveland, MD [facility]  . IR GENERIC HISTORICAL  12/27/2015   IR ANGIOGRAM VISCERAL SELECTIVE 12/27/2015 Arne Cleveland, MD [facility]  . IR GENERIC HISTORICAL  12/27/2015   IR ANGIOGRAM VISCERAL SELECTIVE 12/27/2015 Arne Cleveland, MD [facility]  . PATCH ANGIOPLASTY Left 06/08/2014   Procedure: WITH DACRON PATCH ANGIOPLASTY;  Surgeon: Rosetta Posner, MD;  Location: [facility];  Service: Vascular;  Laterality: Left;  . SPINAL FUSION  12/91,11/91   obtained bone from left lower leg  . stimulator 1991     spine  . stress thallium  1994    normal;  . stretching bladder  07/2007   Dr Risa Grill  . TEE WITHOUT CARDIOVERSION N/A 12/20/2015   Procedure: TRANSESOPHAGEAL ECHOCARDIOGRAM (TEE);  Surgeon: Dorothy Spark, MD;  Location: [facility] ENDOSCOPY;  Service: Cardiovascular;  Laterality: N/A;  :   Current Facility-Administered Medications:  .  0.9 %  sodium chloride infusion, , Intravenous, Once, Nelida Meuse III, MD .  0.9 %  sodium chloride infusion, , Intravenous, Once, Mariel Aloe, MD .  acetaminophen (TYLENOL) tablet 1,000  mg, 1,000 mg, Oral, Q6H PRN **OR** acetaminophen (TYLENOL) suppository 650 mg, 650 mg, Rectal, Q6H PRN, Waldemar Dickens, MD .  alum & mag hydroxide-simeth (MAALOX/MYLANTA) 200-200-20 MG/5ML suspension 30 mL, 30 mL, Oral, PRN, Bertram Savin Sheikh, DO, 30 mL at 12/31/15 1350 .  calcitonin (salmon) (MIACALCIN/FORTICAL) nasal spray 1 spray, 1 spray, Alternating Nares, Daily, Waldemar Dickens, MD, 1 spray at 12/28/15 [phone] .  collagenase (SANTYL) ointment, , Topical, Daily, [employer], DO .  DAPTOmycin (CUBICIN) 579.5 mg in sodium chloride 0.9 % IVPB, 6 mg/kg, Intravenous, Q24H, Volanda Napoleon, MD .  dexamethasone (DECADRON) injection 4 mg, 4 mg, Intravenous, Q24H, Mariel Aloe, MD, 4 mg at 01/05/16 0926 .  diltiazem (CARDIZEM CD) 24 hr capsule 180 mg, 180 mg, Oral, Daily, [employer], DO, 180 mg at 01/05/16 1007 .  feeding supplement (ENSURE ENLIVE) (ENSURE ENLIVE) liquid 237 mL, 237 mL, Oral, BID PRN, Venus, DO .  feeding supplement (PRO-STAT SUGAR FREE 64) liquid 30 mL, 30 mL, Oral, TID BM, Mariel Aloe, MD, 30 mL at 01/04/16 2040 .  fentaNYL (DURAGESIC - dosed mcg/hr) 50 mcg, 50 mcg, Transdermal, Q72H, Dory Horn, NP, 50 mcg at 01/05/16 1031 .  fentaNYL (SUBLIMAZE) injection 12.5-50 mcg, 12.5-50 mcg, Intravenous, Q2H PRN, Bertram Savin Sheikh, DO, 50 mcg at 01/05/16 0726 .  fluconazole (DIFLUCAN) tablet 100 mg, 100 mg, Oral, Daily, Mariel Aloe, MD, 100 mg at 01/05/16 1007 .  insulin aspart (novoLOG) injection 0-5 Units, 0-5 Units, Subcutaneous, QHS, Waldemar Dickens, MD .  insulin aspart (novoLOG) injection 0-9 Units, 0-9 Units, Subcutaneous, TID WC, Waldemar Dickens, MD, 1 Units at 01/04/16 [phone] .  ipratropium-albuterol (DUONEB) 0.5-2.5 (3) MG/3ML nebulizer solution 3 mL, 3 mL, Nebulization, TID, Omair Latif Sheikh, DO, 3 mL at 01/05/16 0847 .  lactated ringers infusion, , Intravenous, Continuous, Nelida Meuse III, MD, Stopped at 12/29/15 0700 .  LORazepam  (ATIVAN) tablet 0.5 mg, 0.5 mg, Oral, Daily PRN, Mariel Aloe, MD, 0.5 mg at 01/05/16 1007 .  magic mouthwash, 10 mL, Oral, QID PRN, Mariel Aloe, MD .  multivitamin with minerals tablet 1 tablet, 1 tablet, Oral, Daily, Mariel Aloe, MD, 1 tablet at 01/04/16 1024 .  nicotine polacrilex (COMMIT) lozenge 2 mg, 2 mg, Oral, PRN, Mariel Aloe, MD .  OLANZapine [facility]) tablet 5 mg, 5 mg, Oral, QHS, Dory Horn, NP .  ondansetron [facility]) injection 4 mg, 4 mg, Intravenous, Q6H PRN, Vena Rua, PA-C, 4 mg at 01/05/16 [id number] .  pantoprazole (PROTONIX) EC tablet 40 mg, 40 mg, Oral, BID, Charlynne Cousins [place]ribbin, PA-C, 40 mg at 01/05/16 1007 .  polyethylene glycol (MIRALAX / GLYCOLAX) packet 17 g, 17 g, Oral, Daily PRN, Waldemar Dickens, MD, 17 g at 01/04/16 0000 .  promethazine (PHENERGAN) tablet 25 mg, 25 mg, Oral, Q6H PRN **OR** promethazine (PHENERGAN) injection 12.5 mg, 12.5 mg, Intravenous, Q6H PRN, 12.5 mg at 12/26/15 0529 **OR** promethazine (PHENERGAN) suppository 25 mg, 25 mg, Rectal, Q6H PRN, Waldemar Dickens, MD .  senna-docusate (Senokot-S) tablet 1 tablet, 1 tablet, Oral, BID PRN, Waldemar Dickens, MD, 1 tablet at 01/04/16 0211 .  sodium chloride flush (NS) 0.9 % injection 10-40 mL, 10-40 mL, Intracatheter, PRN, Omair Latif Sheikh, DO .  sodium chloride flush (NS) 0.9 % injection 3 mL, 3 mL, Intravenous, Q12H, Waldemar Dickens, MD, 3 mL at 01/04/16 2200 .  timolol (TIMOPTIC) 0.5 % ophthalmic solution 1 drop, 1 drop, Both Eyes, BID, Waldemar Dickens, MD, 1 drop at 01/05/16 1008 .  traZODone (DESYREL) tablet 50 mg, 50 mg, Oral, QHS PRN,MR X 1, Omair Latif Sheikh, DO, 50 mg at 01/02/16 2207 .  vancomycin (VANCOCIN) 1,250 mg in sodium chloride 0.9 % 250 mL IVPB, 1,250 mg, Intravenous, Q12H, Romona Curls, RPH, 1,250 mg at 01/05/16 [id number]:  . sodium chloride   Intravenous Once  . sodium chloride   Intravenous Once  . calcitonin (salmon)  1 spray Alternating Nares Daily  . collagenase   Topical  Daily  . DAPTOmycin (CUBICIN)  IV  6 mg/kg Intravenous Q24H  . dexamethasone  4 mg Intravenous Q24H  . diltiazem  180 mg Oral Daily  . feeding supplement (PRO-STAT SUGAR FREE 64)  30 mL Oral TID BM  . fentaNYL  50 mcg  Transdermal Q72H  . fluconazole  100 mg Oral Daily  . insulin aspart  0-5 Units Subcutaneous QHS  . insulin aspart  0-9 Units Subcutaneous TID WC  . ipratropium-albuterol  3 mL Nebulization TID  . multivitamin with minerals  1 tablet Oral Daily  . OLANZapine  5 mg Oral QHS  . pantoprazole  40 mg Oral BID  . sodium chloride flush  3 mL Intravenous Q12H  . timolol  1 drop Both Eyes BID  . vancomycin  1,250 mg Intravenous Q12H  :  Allergies  Allergen Reactions  . Codeine Other (See Comments)    constipation  . Demerol [Meperidine] Other (See Comments)    Drops blood pressure   . Contrast Media [Iodinated Diagnostic Agents] Nausea And Vomiting  . Gabapentin Other (See Comments)    Suicidal, depression  . Other Other (See Comments)    All narcotics make pt hallucinate  :  Family History  Problem Relation Age of Onset  . Diabetes Mother   . Cancer Mother   . Hypertension Father   . Stroke Father   . Alcohol abuse Brother   :  Social History   Social History  . Marital status: Married    Spouse name: N/A  . Number of children: N/A  . Years of education: N/A   Occupational History  . Not on file.   Social History Main Topics  . Smoking status: Former Smoker    Types: Cigarettes    Quit date: 01/01/1991  . Smokeless tobacco: Never Used     Comment: " Quit around 1986"  . Alcohol use No  . Drug use: No  . Sexual activity: Yes    Birth control/ protection: None   Other Topics Concern  . Not on file   Social History Narrative  . No narrative on file  :  Pertinent items are noted in HPI.  Exam: Patient Vitals for the past 24 hrs:  BP Temp Temp src Pulse Resp SpO2 Weight  01/05/16 0847 - - - - - 98 % -  01/05/16 0400 123/81 97.8 F (36.6 C) -  63 17 97 % 213 lb (96.6 kg)  01/05/16 0005 126/68 97.8 F (36.6 C) - 68 16 99 % -  01/04/16 2210 - 97.9 F (36.6 C) Oral - - - -  01/04/16 2005 - 97.9 F (36.6 C) - - - - -  01/04/16 1918 - 98 F (36.7 C) Oral - - - -  01/04/16 1851 108/74 98.4 F (36.9 C) Oral 77 16 95 % -  01/04/16 1542 - - - - - 100 % -    As above    Recent Labs  01/04/16 1757 01/05/16 0716  WBC 1.9* 2.0*  HGB 7.0* 8.4*  HCT 21.6* 25.3*  PLT 47* 35*    Recent Labs  01/04/16 0915  NA 140  K 3.8  CL 111  CO2 24  GLUCOSE 109*  BUN 22*  CREATININE 0.50*  CALCIUM 7.4*    Blood smear review:  Pending  Pathology: See the pathology reports     Assessment and Plan: Mr. Royes is a 75 year old white male with multiple medical problems. We are asked to him because of the pancytopenia. He had a blood clot in his legs. He has had significant risk for pulmonary emboli.  I probably would get a filter placed. I don't see a problem with this being done. In my view, he is not a candidate for anticoagulation.  I think vancomycin  is probably the source for his leukopenia and thrombocytopenia. This does happen. There are certainly other options that can be tried to help with the Staphylococcus in his blood. I think Cubicin would be reasonable. We'll have to watch his CK and his liver tests.  It is very difficult to know whether or not he has an underlying malignancy. I would think that given where the lung masses, a bronchoscopy would be reasonable. However, I think pulmonology would have a hard time doing this given his platelet count.  I much of a bone scan could help. A PET scan can certainly help. Unfortunately, this cannot be done as inpatient.  I had to leave that since his blood counts were normal about 2 weeks ago, that they should normalize again.  It is really difficult to be able to identify a malignancy. There is certainly that concern. He is a former smoker. From what is in the record, he has not  smoked for about 30 years. I'll think he has any kind of occupational exposures.  I will have to try to find a blood smear area and I know this can help [place] out.  I spoke to he and his family. They are all very nice. Again I appreciate and understand his frustration. His heart when he is able to walk.  I just would not think that this is a malignant compression fracture. I would think the MRI which showed that. I think to be interesting to repeat an MRI and see if is any change in this compression fracture.  A biopsy of this mass would be reasonable. Again, his blood counts really would not allow this to happen.  We will try to focus on his blood counts right now. I don't think that we have to give him Neupogen. With his differential yesterday, he had 92% neutrophils.  I spent about an hour with him this morning.

## 2016-01-05 NOTE — Progress Notes (Signed)
Patient refused to go to CT as ordered.

## 2016-01-06 ENCOUNTER — Ambulatory Visit: Payer: Medicare Other

## 2016-01-06 ENCOUNTER — Inpatient Hospital Stay (HOSPITAL_COMMUNITY): Payer: Medicare Other

## 2016-01-06 DIAGNOSIS — R52 Pain, unspecified: Secondary | ICD-10-CM

## 2016-01-06 DIAGNOSIS — D61818 Other pancytopenia: Secondary | ICD-10-CM

## 2016-01-06 LAB — CBC
HCT: 23.1 % — ABNORMAL LOW (ref 39.0–52.0)
HEMOGLOBIN: 7.7 g/dL — AB (ref 13.0–17.0)
MCH: 30.2 pg (ref 26.0–34.0)
MCHC: 33.3 g/dL (ref 30.0–36.0)
MCV: 90.6 fL (ref 78.0–100.0)
Platelets: 33 10*3/uL — ABNORMAL LOW (ref 150–400)
RBC: 2.55 MIL/uL — ABNORMAL LOW (ref 4.22–5.81)
RDW: 17.1 % — AB (ref 11.5–15.5)
WBC: 1.8 10*3/uL — AB (ref 4.0–10.5)

## 2016-01-06 LAB — GLUCOSE, CAPILLARY
GLUCOSE-CAPILLARY: 79 mg/dL (ref 65–99)
GLUCOSE-CAPILLARY: 127 mg/dL — AB (ref 65–99)
Glucose-Capillary: 89 mg/dL (ref 65–99)

## 2016-01-06 LAB — PATHOLOGIST SMEAR REVIEW

## 2016-01-06 LAB — LACTATE DEHYDROGENASE: LDH: 252 U/L — AB (ref 98–192)

## 2016-01-06 MED ORDER — IPRATROPIUM-ALBUTEROL 0.5-2.5 (3) MG/3ML IN SOLN
3.0000 mL | Freq: Four times a day (QID) | RESPIRATORY_TRACT | Status: DC | PRN
  Administered 2016-01-08: 3 mL via RESPIRATORY_TRACT
  Filled 2016-01-06: qty 3

## 2016-01-06 MED ORDER — PREDNISONE 20 MG PO TABS
50.0000 mg | ORAL_TABLET | Freq: Four times a day (QID) | ORAL | Status: DC
  Administered 2016-01-06 – 2016-01-07 (×2): 50 mg via ORAL
  Filled 2016-01-06 (×2): qty 2

## 2016-01-06 MED ORDER — DIPHENHYDRAMINE HCL 25 MG PO CAPS
50.0000 mg | ORAL_CAPSULE | Freq: Once | ORAL | Status: DC
  Filled 2016-01-06: qty 2

## 2016-01-06 MED ORDER — SODIUM CHLORIDE 0.9 % IV SOLN
600.0000 mg | Freq: Two times a day (BID) | INTRAVENOUS | Status: DC
  Administered 2016-01-06 – 2016-01-09 (×6): 600 mg via INTRAVENOUS
  Filled 2016-01-06 (×7): qty 600

## 2016-01-06 MED ORDER — FENTANYL CITRATE (PF) 100 MCG/2ML IJ SOLN
25.0000 ug | INTRAMUSCULAR | Status: DC | PRN
Start: 2016-01-06 — End: 2016-01-07
  Administered 2016-01-06 – 2016-01-07 (×7): 75 ug via INTRAVENOUS
  Filled 2016-01-06 (×8): qty 2

## 2016-01-06 MED ORDER — LORAZEPAM 0.5 MG PO TABS: 0.5000 mg | ORAL_TABLET | Freq: Two times a day (BID) | ORAL | Status: DC | PRN

## 2016-01-06 NOTE — Progress Notes (Signed)
Physical Therapy Treatment Patient Details Name: Marcus [name] MRN: [id number] DOB: 1940/08/17 Today's Date: 01/06/2016    History of Present Illness 75 y.o. male being transferred back to acute care services from CIR with epigastric and RUQ pain.  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 Also found to have GIB, but unable to locate source with EGD.  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    PT Comments    Pt's daughter present during session and attempts to motivate pt.  Pt needs encouragement, but then agrees to participate.  PT and rehab tech began removing pillows from under pt's arms and only able to remove pillows from R side and pt indicates too painful when attempting to remove pillows from L side.  RN had premedicated with Fentanyl just prior to PT arrival.  Attempted to encourage pt to try sitting EOB, but pt repeatedly declined and was only able to tolerate PT elevating HOB to 53 degrees for a few seconds before requesting PT lower HOB due to pain.  Will continue to follow.    Follow Up Recommendations  CIR     Equipment Recommendations  Wheelchair (measurements PT);Wheelchair cushion (measurements PT);3in1 (PT);Other (comment)    Recommendations for Other Services       Precautions / Restrictions Precautions Precautions: Fall Precaution Comments: Paraplegia, multiple sacral and buttocks wounds; limit time OOB to 2 hours at a time Restrictions Weight Bearing Restrictions: No    Mobility  Bed Mobility Overal bed mobility: Needs Assistance;+2 for physical assistance             General bed mobility comments: Attempted to have pt participate in bed mobility and trying to come to sitting, however pt indicates too painful with simply removing pillows under arms.  Instead attempted to elevate HOB to facilitate more upright positioning, but pt only able to tolerate HOB at 53 degrees for a few seconds  before requesting PT to lower HOB.  pt continues to comment about being afraid of falling despite being in bed.    Transfers                    Ambulation/Gait                 Stairs            Wheelchair Mobility    Modified Rankin (Stroke Patients Only)       Balance Overall balance assessment: Needs assistance Sitting-balance support: Bilateral upper extremity supported;Feet supported Sitting balance-Leahy Scale: Zero                              Cognition Arousal/Alertness: Awake/alert (Drowsy) Behavior During Therapy: WFL for tasks assessed/performed Overall Cognitive Status: Impaired/Different from baseline Area of Impairment: Attention;Memory;Following commands;Safety/judgement;Awareness;Problem solving;Orientation Orientation Level: Disoriented to;Time Current Attention Level: Selective Memory: Decreased short-term memory Following Commands: Follows one step commands inconsistently;Follows one step commands with increased time Safety/Judgement: Decreased awareness of safety;Decreased awareness of deficits Awareness: Intellectual Problem Solving: Slow processing;Decreased initiation;Difficulty sequencing;Requires verbal cues;Requires tactile cues General Comments: pt at times is on task, but difficult to keep him on task.  pt often begins to mouth words rather than audibly speaking and needs cues to speak to PT.      Exercises      General Comments        Pertinent Vitals/Pain Pain Assessment: Faces Faces Pain  Scale: Hurts whole lot Pain Location: pt grimaces and seems almost tearful, indicating pain in R side. Pain Descriptors / Indicators: Grimacing;Guarding;Crying Pain Intervention(s): Monitored during session;Premedicated before session;Repositioned    Home Living                      Prior Function            PT Goals (current goals can now be found in the care plan section) Acute Rehab PT Goals Patient  Stated Goal: to go home PT Goal Formulation: With patient Time For Goal Achievement: 01/09/16 Potential to Achieve Goals: Good Progress towards PT goals: Not progressing toward goals - comment (pt with pain and fatigue)    Frequency    Min 3X/week      PT Plan Current plan remains appropriate    Co-evaluation             End of Session   Activity Tolerance: Patient limited by pain;Patient limited by fatigue Patient left: in bed;with call bell/phone within reach;with family/visitor present     Time: 1133-1203 PT Time Calculation (min) (ACUTE ONLY): 30 min  Charges:  $Therapeutic Activity: 23-37 mins                    G CodesCatarina Hartshorn, [place] 01/06/2016, 1:07 PM

## 2016-01-06 NOTE — Progress Notes (Signed)
Rehab admissions - I spoke with attending MD who says patient is not medically ready for inpatient rehab.  I will continue to follow for progress.  Call me for questions.  CK:[phone]

## 2016-01-06 NOTE — Progress Notes (Addendum)
Daily Progress Note   Patient Name: Marcus [name]       Date: 01/06/2016 DOB: [birth year]  Age: 75 y.o. MRN#: [id number] Attending Physician: Mariel Aloe, MD Primary Care Physician: Jeanmarie Hubert, MD Admit Date: 12/25/2015  Reason for Consultation/Follow-up: Establishing goals of care, Non pain symptom management, Pain control and Psychosocial/spiritual support  Subjective: I met today with Ms. Cubit (very briefly as she was leaving for work). Also met with daughter, Loma Sousa, at bedside. Mr. Karge agitation and delirium seems much improved and wife says he was much clearer last night. He continues to have severe pain and I believe his insomnia will improve with better pain control. Will increase Fentanyl IV prn and will wait another 24 hours before considering increase in Fentanyl patch. All questions/concerns addressed. Main goal is symptom management currently as we await recommendations from heme/onc and other service lines.   Length of Stay: 12  Current Medications: Scheduled Meds:  . sodium chloride   Intravenous Once  . sodium chloride   Intravenous Once  . calcitonin (salmon)  1 spray Alternating Nares Daily  . collagenase   Topical Daily  . DAPTOmycin (CUBICIN)  IV  6 mg/kg Intravenous Q24H  . dexamethasone  4 mg Intravenous Q24H  . diltiazem  180 mg Oral Daily  . feeding supplement (PRO-STAT SUGAR FREE 64)  30 mL Oral TID BM  . fentaNYL  50 mcg Transdermal Q72H  . fluconazole  100 mg Oral Daily  . insulin aspart  0-5 Units Subcutaneous QHS  . insulin aspart  0-9 Units Subcutaneous TID WC  . multivitamin with minerals  1 tablet Oral Daily  . OLANZapine  5 mg Oral QHS  . pantoprazole  40 mg Oral BID  . polyethylene glycol  17 g Oral BID  . sodium chloride flush  3 mL  Intravenous Q12H  . timolol  1 drop Both Eyes BID    Continuous Infusions: . lactated ringers Stopped (12/29/15 0700)    PRN Meds: acetaminophen **OR** acetaminophen, alum & mag hydroxide-simeth, feeding supplement (ENSURE ENLIVE), fentaNYL (SUBLIMAZE) injection, ipratropium-albuterol, LORazepam, magic mouthwash, nicotine polacrilex, ondansetron (ZOFRAN) IV, promethazine **OR** promethazine **OR** promethazine, senna-docusate, sodium chloride flush, traZODone  Physical Exam  Constitutional: He appears well-developed and well-nourished.  HENT:  Head: Normocephalic and atraumatic.  Cardiovascular: Normal rate and regular rhythm.  Pulmonary/Chest: Effort normal. No accessory muscle usage. No tachypnea. No respiratory distress.  Abdominal: Soft. Normal appearance.  Genitourinary:  Genitourinary Comments: Foley catheter  Musculoskeletal:  Normal range of motion to his upper extremities Is moving his left foot  Neurological: He is alert.  Oriented to self, recognizes his family, oriented more to situation but not entirely  Skin: Skin is warm and dry.  Psychiatric:  Patient is anxious and agitated, thought processes are disorganized He is crying jumping from topic to topic. Unable to console him  Nursing note and vitals reviewed.           Vital Signs: BP 107/68   Pulse 67   Temp 98 F (36.7 C) (Oral)   Resp 19   Ht _0  (1.702 m)   Wt 97.4 kg (214 lb 11.2 oz)   SpO2 92%   BMI 33.63 kg/m  SpO2: SpO2: 92 % O2 Device: O2 Device: Not Delivered O2 Flow Rate: O2 Flow Rate (L/min): 1 L/min  Intake/output summary:   Intake/Output Summary (Last 24 hours) at 01/06/16 1017 Last data filed at 01/06/16 0900  Gross per 24 hour  Intake           351.59 ml  Output             3327 ml  Net         -2975.41 ml   LBM: Last BM Date: 01/03/16 Baseline Weight: Weight: 94.1 kg (207 lb 8 oz) Most recent weight: Weight: 97.4 kg (214 lb 11.2 oz)       Palliative Assessment/Data: PPS:  20%   Flowsheet Rows   Flowsheet Row Most Recent Value  Intake Tab  Referral Department  Hospitalist  Unit at Time of Referral  Med/Surg Unit  Palliative Care Primary Diagnosis  Sepsis/Infectious Disease  Date Notified  01/03/16  Palliative Care Type  New Palliative care  Reason for referral  Pain, Clarify Goals of Care, Psychosocial or Spiritual support, Non-pain Symptom  Date of Admission  12/14/15  Date first seen by Palliative Care  01/04/16  # of days Palliative referral response time  1 Day(s)  # of days IP prior to Palliative referral  20  Clinical Assessment  Palliative Performance Scale Score  30%  Pain Max last 24 hours  Not able to report  Pain Min Last 24 hours  Not able to report  Dyspnea Max Last 24 Hours  Not able to report  Dyspnea Min Last 24 hours  Not able to report  Nausea Max Last 24 Hours  Not able to report  Nausea Min Last 24 Hours  Not able to report  Anxiety Max Last 24 Hours  Not able to report  Other Max Last 24 Hours  Not able to report  Psychosocial & Spiritual Assessment  Palliative Care Outcomes  Palliative Care follow-up planned  Yes, Facility      Patient Active Problem List   Diagnosis Date Noted  . Compression fracture of thoracic spine, non-traumatic ([place])   . Palliative care encounter   . Inadequate pain control 01/02/2016  . Acute confusional state 01/02/2016  . Leukocytosis   . Myopathy   . Other abnormalities of gait and mobility   . Left leg DVT ([place]) 12/25/2015  . Acute blood loss anemia 12/25/2015  . Gastrointestinal hemorrhage associated with duodenal ulcer 12/25/2015  . Melena   . 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 Paraplegia ([place]) 12/24/2015  . Neurogenic bowel   . Neurogenic bladder   .  Hyperlipidemia   . 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 MRSA infection 12/23/2015  . MSSA (methicillin susceptible Staphylococcus aureus) septicemia ([place]) 12/23/2015  . Pressure injury of skin 12/19/2015  . Closed fracture  of seventh thoracic vertebra ([place])   . T7 vertebral fracture ([place])   . Decreased sensation   . Atrial fibrillation with RVR ([place]) 12/14/2015  . Fall 12/14/2015  . Community acquired pneumonia of left lung 12/14/2015  . Compression fracture of vertebra ([place]) 12/14/2015  . Urinary retention 12/14/2015  . PAF (paroxysmal atrial fibrillation) ([place]) 12/14/2015  . Lung mass 12/14/2015  . Lower extremity weakness 12/14/2015  . SOB (shortness of breath) 11/12/2015  . Chest pain 11/12/2015  . S/P carotid endarterectomy 04/10/2015  . 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 Glaucoma 03/20/2015  . Diabetes ([place]) 10/10/2014  . HLD (hyperlipidemia) 10/10/2014  . 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 Essential hypertension   . Carotid stenosis   . TIA (transient ischemic attack) 06/01/2014  . Malaise and fatigue 11/08/2013  . Lumbago   . Benign essential tremor   . Vitamin D deficiency     Palliative Care Assessment & Plan   Patient Profile: 75 y.o. male  with past medical history of Atrial fibrillation, hypertension, diabetes, CVA, GI bleed admitted on 12/25/2015 with generalized weakness, failure to thrive, after a recent fall. He was initially admitted to the hospital on 12/14/2015 then was transferred to inpatient rehabilitation, and now is readmitted to the hospitalist service on 12/25/2015. His initial chest x-ray on 12/14/2015 revealed community-acquired pneumonia as well as findings of a right lower lobe mass, closed fracture of T7 vertebrae. Patient's biopsy of right lower lobe mass revealed inflammatory changes. 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 He is being treated with antibiotics. Despite supportive therapy, antibiotics extensive workup his mood has remained very labile, ongoing reports of pain, as well as inability to move his lower  extremities.. He has been seen by psychiatry and felt to have capacity.  Additionally he has thrombocytopenia, platelets 62, and an albumin level of 1.5 On 11 07/02/2015 he has developed pancytopenia, white blood cell count 1.9; hemoglobin 7.0 after being transfused 1 unit (was 6.4 on 01/04/2016), platelets 47. Oncology consult placed to Dr. Burney Gauze who did see patient on 01/05/2016   Recommendations/Plan:  Previous discussion by my colleague with family: "Wife understands terms of full code versus DO NOT RESUSCITATE and wishes her husband to remain a full code. She does understand that she may have to make a decision if he is unable to wean off of the ventilator on his own to terminally extubate him. If the worse case scenario were to happen and he did require intubation and could not extubate on his own  she would not want a trach or a PEG."  Pain: Continue fentanyl transdermal patch at 50 g every 72 hours and fentanyl IV increased to 75 g every 2 hours as needed for breakthrough pain. Anticipate we will need to increase fentanyl patch but given his sensitivity to medications will give it another 24 hours.   Acute confusional state/delirium: Continue Zyprexa 5 mg daily at bedtime. Still confused but much more appropriate - not yelling or agitated.   Anxiety: Continue Ativan 0.5 mg daily as needed.  Goals of Care and Additional Recommendations:  Limitations on Scope of Treatment: No Tracheostomy  Code Status:  Code Status Orders        Start     Ordered   12/25/15 1649  Full code  Continuous     12/25/15 1654    Code Status History    Date Active Date Inactive Code Status Order ID Comments User Context   12/25/2015  4:54 PM 12/25/2015 10:00 PM Full Code [id number]  Waldemar Dickens, MD Inpatient   12/24/2015  6:15 PM 12/24/2015  6:15 PM Full Code [id number]  Cathlyn Parsons, PA-C Inpatient   12/24/2015  6:15 PM 12/25/2015  4:54 PM Full Code [id number]  Cathlyn Parsons,  PA-C Inpatient   12/14/2015  3:36 PM 12/24/2015  6:03 PM Full Code [id number]  Radene Gunning, NP ED   06/08/2014  2:27 PM 06/10/2014 12:47 PM Full Code [id number]  Alvia Grove, PA-C Inpatient   06/01/2014  4:00 PM 06/03/2014  7:26 PM Full Code [id number]  Samella Parr, NP Inpatient    Advance Directive Documentation   Flowsheet Row Most Recent Value  Type of Advance Directive  Living will  Pre-existing out of facility DNR order (yellow form or pink MOST form)  No data  "MOST" Form in Place?  No data       Prognosis:   Unable to determine - difficult to say but very worried with his decline and the complications he has had since initial admission.   Discharge Planning:  To Be Determined   Thank you for allowing the Palliative Medicine Team to assist in the care of this patient.   Time In: 0915 Time Out: 1015 Total Time 60 min Prolonged Time Billed  no      Greater than 50%  of this time was spent counseling and coordinating care related to the above assessment and plan.  Vinie Sill, NP Palliative Medicine Team Pager # [phone] (M-F 8a-5p) Team Phone # [phone] (Nights/Weekends)  Please contact Palliative Medicine Team phone at [phone] for questions and concerns.

## 2016-01-06 NOTE — Progress Notes (Addendum)
PROGRESS NOTE    Marcus [name]  [id number] DOB: 07/30/1940 DOA: 12/25/2015 PCP: Jeanmarie Hubert, MD   Brief Narrative: Marcus [name] is a 75 y.o. 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   Assessment & Plan:   Principal Problem:   Acute confusional state Active Problems:   Essential hypertension   Cerebrovascular accident (CVA) due to stenosis of left carotid artery ([facility])   Atrial fibrillation with RVR ([facility])   Urinary retention   Lower extremity weakness   T7 vertebral fracture ([facility])   Pressure injury of skin   MRSA infection   MSSA (methicillin susceptible Staphylococcus aureus) septicemia ([facility])   Paraplegia ([facility])   Acute blood loss anemia   Melena   Gastrointestinal hemorrhage associated with duodenal ulcer   Myopathy   Other abnormalities of gait and mobility   Leukocytosis   Inadequate pain control   Compression fracture of thoracic spine, non-traumatic ([facility])   Palliative care encounter   Uncontrolled pain   Symptomatic anemia Likely secondary to GI bleed secondary upper GI pathology. Suspect steroids have maintained a persistent ulcer which, with the addition of Eliquis, has been bleeding. S/p 5u PRBC since admission. EGD on 11/1 significant for nonbleeding ulcer.  -daily CBC -Gastroenterology recommendations: signed off. Outpatient follow-up  ?Confusion Patient back to baseline from when I have seen him. Pain is a significant factor with regard to his mental status. -ativan prn daily -follow-up palliative care recommendations for pain management: fentanyl patch and increase fentanyl prn  GI bleed Initial EGD significant for non-bleeding ulcers. Embolization was not performed by IR secondary to negative mesenteric arteriogram. Hemoglobin dropping. No evidence of bleeding. 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 Patient has not had vancomycin since 10/25. Unsure of what happened. -continue 2 weeks of vancomycin with switch to cafazolin to complete 6 weeks of antibiotics per ID recommendations. Restart vancomycin -new ID recommendations: Vancomycin until 11/15, then Cefazolin 2g q8 hours from 11/16 to 12/27. Vancomycin switched to Daptomycin by Heme/Onc. Will switch to Ceftaroline for MRSA pneumonia. -transition to fentanyl patch -increase fentanyl prn per palliative care -will consider re-adding Lyrica at some point  Pancytopenia Patient not on heparin or lovenox. Stable. Not bleeding. Differential with normal ANC. ?iatrogenic secondary to vancomycin -hematology recommendations: discontinue vancomycin and start daptomycin  -path review -FOBT  Urinary retention -Urology recommendations: follow-up with urologist in 1-2 weeks for foley change (from 01/05/2016)  Atrial fibrillation with RVR Currently rate controlled -cardiology recommendations: signed off -continue diltiazem -Eliquis for anticoagulation discontinued  Bilateral LE DVT Eliquis held -IR for IVC filter  Anxiety Likely depression Insomnia -continue ativan prn daily -continue trazodone -Zyprexa started per palliative care recommendations  Severe protein calorie malnutrition -nutrition recommendations -Ensure  History of TIA/CVA -plavix dc'd secondary to GI bleed  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 Patient afebrile. Resolved.   DVT prophylaxis: None. Acute DVT with continuing  anemia Code Status: Full code Family Communication: Discussed with wife over the phone Disposition Plan: Discharge to inpatient rehab when medically ready (call one day  before planned discharge)   Consultants:   GI  Cardiology  Interventional Radiology  Infectious Diseases  General Surgery  Hematology  Procedures:  EGD  Mesenteric angiogram  EGD (01/01/2016)  Antimicrobials:  Vancomycin (10/25; 11/1>>11/04)  Daptomycin (11/5>>11/6)  Diflucan (11/1>>11/10  Ceftaroline (11/6>>  Cefazolin (11/16>>12/27   Subjective: Patient reports persistent pain on left upper abdomen  Objective: Vitals:   01/06/16 0819 01/06/16 0836 01/06/16 1018 01/06/16 1135  BP:   103/71   Pulse:      Resp:      Temp: 98 F (36.7 C)   98.6 F (37 C)  TempSrc: Oral   Oral  SpO2:  92%    Weight:      Height:        Intake/Output Summary (Last 24 hours) at 01/06/16 1258 Last data filed at 01/06/16 0900  Gross per 24 hour  Intake           351.59 ml  Output             3327 ml  Net         -2975.41 ml   Filed Weights   01/04/16 0455 01/05/16 0400 01/06/16 0612  Weight: 102.3 kg (225 lb 9.6 oz) 96.6 kg (213 lb) 97.4 kg (214 lb 11.2 oz)    Examination:  General exam: Appears calm and comfortable  Respiratory system: Clear and diminished to auscultation. Respiratory effort normal. Cardiovascular system: S1 & S2 heard, RRR. No murmurs, rubs, gallops or clicks. Gastrointestinal system: Abdomen is nondistended, soft and mildly tender in LUQ. Normal bowel sounds heard. Central nervous system: Alert and oriented. CN intact. Minimal sensation in bilateral lower extremities. 0/5 LE strength bilaterally Extremities: No calf tenderness. 2+ edema in bilateral legs Skin: No cyanosis. No rashes Psychiatry: Judgement and insight appear normal. Mood & affect depressed. Had one episode of crying. Patient is labile    Data Reviewed: I have personally reviewed following labs and imaging  studies  CBC:  Recent Labs Lab 01/04/16 0915 01/04/16 1457 01/04/16 1757 01/05/16 0716 01/06/16 0425  WBC 2.1* 1.8* 1.9* 2.0* 1.8*  NEUTROABS  --  1.6*  --   --   --   HGB 7.6* 6.4* 7.0* 8.4* 7.7*  HCT 23.5* 19.6* 21.6* 25.3* 23.1*  MCV 94.0 94.2 94.3 90.7 90.6  PLT 54* 46* 47* 35* 33*   Basic Metabolic Panel:  Recent Labs Lab 12/31/15 0815 01/02/16 0450 01/04/16 0915  NA 138 138 140  K 4.3 4.2 3.8  CL 110 109 111  CO2 24 25 24   GLUCOSE 114* 111* 109*  BUN 27* 21* 22*  CREATININE 0.63 0.51* 0.50*  CALCIUM 7.3* 7.1* 7.4*   GFR: Estimated Creatinine Clearance: 92.8 mL/min (by C-G formula based on SCr of 0.5 mg/dL (L)). Liver Function Tests:  Recent Labs Lab 12/31/15 0815  AST 32  ALT 26  ALKPHOS 59  BILITOT 0.5  PROT 3.7*  ALBUMIN 1.5*   No results for input(s): LIPASE, AMYLASE in the last 168 hours.  Recent Labs Lab 01/04/16 0915  AMMONIA 22   Coagulation Profile: No results for input(s): INR, PROTIME in the last 168 hours. Cardiac Enzymes:  Recent Labs Lab 01/05/16 1845  CKTOTAL 84   BNP (last 3 results) No results for input(s): PROBNP in the last 8760 hours. HbA1C: No results for input(s): HGBA1C in the last 72 hours. CBG:  Recent Labs Lab 01/05/16 1146 01/05/16 1647 01/05/16 2210 01/06/16 0734 01/06/16 1146  GLUCAP 72 135* 161* 79 89  Lipid Profile: No results for input(s): CHOL, HDL, LDLCALC, TRIG, CHOLHDL, LDLDIRECT in the last 72 hours. Thyroid Function Tests:  Recent Labs  01/04/16 0915  TSH 2.037   Anemia Panel:  Recent Labs  01/04/16 1507 01/04/16 1615  FERRITIN  --  1,279*  TIBC  --  155*  IRON  --  46  RETICCTPCT 3.3*  --    Sepsis Labs: No results for input(s): PROCALCITON, LATICACIDVEN in the last 168 hours.  No results found for this or any previous visit (from the past 240 hour(s)).       Radiology Studies: No results found.      Scheduled Meds: . sodium chloride   Intravenous Once  .  sodium chloride   Intravenous Once  . calcitonin ([place])  1 spray Alternating Nares Daily  . collagenase   Topical Daily  . DAPTOmycin (CUBICIN)  IV  6 mg/kg Intravenous Q24H  . dexamethasone  4 mg Intravenous Q24H  . diltiazem  180 mg Oral Daily  . feeding supplement (PRO-STAT SUGAR FREE 64)  30 mL Oral TID BM  . fentaNYL  50 mcg Transdermal Q72H  . fluconazole  100 mg Oral Daily  . insulin aspart  0-5 Units Subcutaneous QHS  . insulin aspart  0-9 Units Subcutaneous TID WC  . multivitamin with minerals  1 tablet Oral Daily  . OLANZapine  5 mg Oral QHS  . pantoprazole  40 mg Oral BID  . polyethylene glycol  17 g Oral BID  . sodium chloride flush  3 mL Intravenous Q12H  . timolol  1 drop Both Eyes BID   Continuous Infusions: . lactated ringers Stopped (12/29/15 0700)     LOS: 12 days     Cordelia Poche [facility] 01/06/2016, 12:58 PM Pager: [phone]  If 7PM-7AM, please contact night-coverage [email or web address] Password [email or web address] 01/06/2016, 12:58 PM

## 2016-01-06 NOTE — Consult Note (Signed)
Chief Complaint: Patient was seen in consultation today for retrievable inferior vena cava filter placement at the request of Dr Georgena Spurling  Referring Physician(s): Dr Georgena Spurling Dr Burney Gauze  Supervising Physician: Corrie Mckusick  Patient Status: [facility] - In-pt  History of Present Illness: Marcus [name] is a 75 y.o. male   Hx CVA Hx bacteremia PCM  Paraplegia T7 fracture Osteomyelitis LLE DVT: Doppler 10/25 - Findings consistent with acute deep vein thrombosis involving the   right soleal vein and left peroneal vein. Cannot anticoagulate secondary GI Bleed Pt high risk = paraplegia  Request for retrievable IVC filter placement Discussed with Dr Earleen Newport He approves procedure  Pt allergic to contrast Will premedicate for 11/7 am  Past Medical History:  Diagnosis Date  . Anxiety state, unspecified   . 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 s/p L CEA 2016.  Marland Kitchen Complication of anesthesia    " I woke up in the recovery room with tube in throat and panicked."  . 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 Essential hypertension e  . Herpes zoster without mention of complication   . History of hiatal hernia   . Hypertrophy of prostate with urinary obstruction and other lower urinary tract symptoms (LUTS)   . Insomnia, unspecified   . Junctional bradycardia    a. developed this with digoxin 12/2015 requiring cessation.  . Lumbago   . Lung mass    a. 12/2015: right lower lobe mass felt secondary to MSSA. Biopsy of right lung mass revealed inflammation/fibrosis favoring inflammatory process but malignancy not entirely ruled out.  Marland Kitchen MSSA (methicillin susceptible Staphylococcus aureus) septicemia ([place]) 12/23/2015  . Other and unspecified hyperlipidemia   . 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 Paroxysmal atrial fibrillation ([place]) 12/14/2015   a. dx 12/2015, in/out on telemetry, digoxin stopped 2/2 junctional  bradycardia.  . Pneumonia    bronchial  . Reflux esophagitis   . Seasonal allergies   . Stroke ([place])   . TIA (transient ischemic attack)   . Type II or unspecified type diabetes mellitus without mention of complication, not stated as uncontrolled   . Unspecified vitamin D deficiency   . Urethral stricture unspecified   . Urinary frequency     Past Surgical History:  Procedure Laterality Date  . CARDIAC CATHETERIZATION  1986   normal, Dr Lia Foyer  . ENDARTERECTOMY Left 06/08/2014   Procedure: LEFT CAROTID ARTERY ENDARTERECTOMY;  Surgeon: Rosetta Posner, MD;  Location: [place];  Service: Vascular;  Laterality: Left;  . ESOPHAGOGASTRODUODENOSCOPY N/A 12/27/2015   Procedure: ESOPHAGOGASTRODUODENOSCOPY (EGD);  Surgeon: Doran Stabler, MD;  Location: [facility] ENDOSCOPY;  Service: Endoscopy;  Laterality: N/A;  . ESOPHAGOGASTRODUODENOSCOPY N/A 01/01/2016   Procedure: ESOPHAGOGASTRODUODENOSCOPY (EGD);  Surgeon: Ladene Artist, MD;  Location: [facility] ENDOSCOPY;  Service: Endoscopy;  Laterality: N/A;  . EYE SURGERY  2014   cataract  . EYE SURGERY  2013   Retina  . IR GENERIC HISTORICAL  12/27/2015   IR ANGIOGRAM VISCERAL SELECTIVE 12/27/2015 Arne Cleveland, MD [facility]  . IR GENERIC HISTORICAL  12/27/2015   IR US GUIDE VASC ACCESS RIGHT 12/27/2015 Arne Cleveland, MD [facility]  . IR GENERIC HISTORICAL  12/27/2015   IR ANGIOGRAM VISCERAL SELECTIVE 12/27/2015 Arne Cleveland, MD [facility]  . IR GENERIC HISTORICAL  12/27/2015   IR ANGIOGRAM VISCERAL SELECTIVE 12/27/2015 Arne Cleveland, MD [facility]  . PATCH  ANGIOPLASTY Left 06/08/2014   Procedure: WITH DACRON PATCH ANGIOPLASTY;  Surgeon: Rosetta Posner, MD;  Location: [place];  Service: Vascular;  Laterality: Left;  . SPINAL FUSION  12/91,11/91   obtained bone from left lower leg  . stimulator 1991     spine  . stress thallium  1994    normal;  . stretching bladder  07/2007   Dr Risa Grill  . TEE WITHOUT CARDIOVERSION N/A 12/20/2015   Procedure:  TRANSESOPHAGEAL ECHOCARDIOGRAM (TEE);  Surgeon: Dorothy Spark, MD;  Location: [facility] ENDOSCOPY;  Service: Cardiovascular;  Laterality: N/A;    Allergies: Codeine; Demerol [meperidine]; Contrast media [iodinated diagnostic agents]; Gabapentin; and Other  Medications: Prior to Admission medications   Medication Sig Start Date End Date Taking? Authorizing Provider  apixaban (ELIQUIS) 5 MG TABS tablet Take 1 tablet (5 mg total) by mouth 2 (two) times daily. 12/24/15   Venetia Maxon Rama, MD  atorvastatin (LIPITOR) 40 MG tablet TAKE ONE TABLET BY MOUTH ONCE DAILY FOR CHOLESTEROL Patient taking differently: TAKE ONE TABLET BY MOUTH ONCE DAILY AT BEDTIME FOR CHOLESTEROL 11/20/15   Estill Dooms, MD  Cholecalciferol (VITAMIN D3) 5000 units TABS Take 5,000 Units by mouth at bedtime.    Historical Provider, MD  clopidogrel (PLAVIX) 75 MG tablet TAKE 1 TABLET BY MOUTH DAILY 07/04/15   Rosetta Posner, MD  dexamethasone (DECADRON) 10 MG/ML injection Inject 1 mL (10 mg total) into the vein every 6 (six) hours. 12/24/15   Venetia Maxon Rama, MD  diltiazem (CARDIZEM CD) 180 MG 24 hr capsule Take 1 capsule (180 mg total) by mouth daily. 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 12/24/15   Venetia Maxon Rama, MD  HYDROcodone-acetaminophen (NORCO/VICODIN) 5-325 MG tablet Take 0.5-1 tablets by mouth every 6 (six) hours as needed for moderate pain.     Historical Provider, MD  LORazepam (ATIVAN) 0.5 MG tablet TAKE 1 TABLET BY MOUTH 3 TIMES A DAY AS NEEDED 12/30/15   Estill Dooms, MD  nicotine polacrilex (COMMIT) 2 MG lozenge Take 2 mg by mouth  every 2 (two) hours.     Historical Provider, MD  ondansetron (ZOFRAN) 4 MG tablet Take 1 tablet (4 mg total) by mouth every 8 (eight) hours as needed for nausea or vomiting. 12/02/15   Lauree Chandler, NP  polyethylene glycol (MIRALAX / GLYCOLAX) packet Take 17 g by mouth daily. 12/25/15   Christina P Rama, MD  senna-docusate (SENOKOT-S) 8.6-50 MG tablet Take 1 tablet by mouth 2 (two) times daily. 12/24/15   Christina P Rama, MD  timolol (BETIMOL) 0.5 % ophthalmic solution Place 1 drop into both eyes 2 (two) times daily.    Historical Provider, MD  vancomycin 1,250 mg in sodium chloride 0.9 % 250 mL Inject 1,250 mg into the vein every 12 (twelve) hours. 12/24/15   Venetia Maxon Rama, MD     Family History  Problem Relation Age of Onset  . Diabetes Mother   . Cancer Mother   . Hypertension Father   . Stroke Father   . Alcohol abuse Brother     Social History  Social History  . Marital status: Married    Spouse name: N/A  . Number of children: N/A  . Years of education: N/A   Social History Main Topics  . Smoking status: Former Smoker    Types: Cigarettes    Quit date: 01/01/1991  . Smokeless tobacco: Never Used     Comment: " Quit around 1986"  . Alcohol use No  . Drug use: No  . Sexual activity: Yes    Birth control/ protection: None   Other Topics Concern  . None   Social History Narrative  . None     Review of Systems: A 12 point ROS discussed and pertinent positives are indicated in the HPI above.  All other systems are negative.  Review of Systems  Constitutional: Positive for activity change, appetite change and fatigue. Negative for fever.  Respiratory: Negative for cough and shortness of breath.   Musculoskeletal: Positive for back pain and gait problem.  Neurological: Positive for weakness.  Psychiatric/Behavioral: Negative for behavioral problems and confusion.    Vital Signs: BP 104/68   Pulse 68   Temp 98.6 F (37 C) (Oral)   Resp 19   Ht 5\' 7"   (1.702 m)   Wt 214 lb 11.2 oz (97.4 kg)   SpO2 96%   BMI 33.63 kg/m   Physical Exam  Cardiovascular: Normal rate and regular rhythm.   Pulmonary/Chest: Effort normal and breath sounds normal.  Abdominal: Soft. Bowel sounds are normal.  Musculoskeletal:  paraplegia  Neurological: He is alert.  Skin: Skin is warm.  Psychiatric: He has a normal mood and affect. His behavior is normal. Judgment and thought content normal.  Nursing note and vitals reviewed.   Mallampati Score:  MD Evaluation Airway: WNL Heart: WNL Abdomen: WNL Chest/ Lungs: WNL ASA  Classification: 3 Mallampati/Airway Score: One  Imaging: Ct Abdomen Pelvis Wo Contrast  Result Date: 12/14/2015 CLINICAL DATA:  Abdominal distention and pain. Shortness of breath. Decreased appetite. EXAM: CT CHEST, ABDOMEN AND PELVIS WITHOUT CONTRAST TECHNIQUE: Multidetector CT imaging of the chest, abdomen and pelvis was performed following the standard protocol without IV contrast. COMPARISON:  CT abdomen pelvis 12/01/2015 and CT chest 07/07/2013. FINDINGS: CT CHEST FINDINGS Cardiovascular: Atherosclerotic calcification of the arterial vasculature, including three-vessel involvement of the coronary arteries. Heart size normal. There is pericardial calcification, indicative of prior pericarditis. No pericardial effusion. Mediastinum/Nodes: No pathologically enlarged mediastinal or axillary lymph nodes. Hilar regions are difficult to definitively evaluate without IV contrast. Esophagus is grossly unremarkable. Lungs/Pleura: Mild centrilobular emphysema. Rounded masslike consolidation in the medial aspect of the superior segment right lower lobe measures approximately 4.7 x 4.9 cm, is new from 07/07/2013, has internal ossific/calcific debris and obscures the extrapleural fat plane. Soft tissue extends around the adjacent vertebral body into the left extrapleural space. Trace right pleural effusion. Lungs are otherwise clear. Musculoskeletal:  Lucency and destruction of the T7 vertebral body is seen with surrounding soft tissue. There is lysis within the anterior aspect of the T6 vertebral body as well. Airway is unremarkable. CT ABDOMEN PELVIS FINDINGS Hepatobiliary: Liver and gallbladder are unremarkable. No biliary ductal dilatation. Pancreas: Negative. Spleen: Negative. Adrenals/Urinary Tract: Adrenal glands are unremarkable. Inferior aspects of both kidneys are obscured by streak artifact from spinal hardware. Low-attenuation lesions in the left kidney measure up to 2.0 cm and are difficult to further characterize without post-contrast imaging. Ureters are decompressed. There are scattered bladder diverticulum. Stomach/Bowel: Stomach, small bowel, appendix and colon are unremarkable. Vascular/Lymphatic: Atherosclerotic calcification of the  arterial vasculature without abdominal aortic aneurysm. Postoperative changes are seen at the aortic bifurcation. No pathologically enlarged lymph nodes. Reproductive: Prostate is slightly enlarged and calcified. Other: No free fluid.  Mesenteries and peritoneum are unremarkable. Musculoskeletal: Postoperative changes in the lumbar spine. No additional worrisome lytic or sclerotic lesions. Bone graft harvest sites in the iliac wings. IMPRESSION: 1. Masslike consolidation in the right lower lobe with invasion of the extrapleural fat, worrisome for primary bronchogenic carcinoma. Lymphoma and sarcoma are considered less likely. 2. Lytic destruction and compression of the T7 vertebral body with surrounding soft tissue, consistent with a pathologic fracture. Minimal involvement of the T6 vertebral body. 3. Trace right pleural effusion. 4. No evidence of primary malignancy or metastatic disease in the abdomen or pelvis. 5.  Aortic atherosclerosis (ICD10-170.0). Electronically Signed   By: Lorin Picket M.D.   On: 12/14/2015 14:21   Dg Chest 2 View  Result Date: 12/17/2015 CLINICAL DATA:  Shortness of breath,  left-sided chest pain EXAM: CHEST  2 VIEW COMPARISON:  CT chest of 12/14/2015, chest x-ray of 12/14/2015, and two-view chest x-ray of 11/12/2015 FINDINGS: There is abnormal opacity overlying the right hilum and suprahilar region consistent with the soft tissue mass described on recent CT of the chest. In view of the rapid development since chest x-ray of 11/12/2015, an infectious or inflammatory process would be more likely than neoplasm. No pneumonia is seen and there is a small right pleural effusion present. The heart is within upper limits normal. Partial compression of T8 and the anterior inferior aspect of T7 is noted which could also either be infectious, inflammatory, or neoplastic in origin. IMPRESSION: 1. Abnormal opacity overlies the right hilum/suprahilar region consistent with the previously described soft tissue mass involving the posterior medial right lower lobe and thoracic vertebral column. Favor infectious or inflammatory process as noted above. 2. Small right pleural effusion. Electronically Signed   By: Ivar Drape M.D.   On: 12/17/2015 11:57   Ct Head Wo Contrast  Result Date: 12/14/2015 CLINICAL DATA:  Generalized weakness, headache and photosensitivity. Decreased appetite. Fall yesterday with loss of sensation in right leg, initial encounter. EXAM: CT HEAD WITHOUT CONTRAST TECHNIQUE: Contiguous axial images were obtained from the base of the skull through the vertex without intravenous contrast. COMPARISON:  None. FINDINGS: Brain: No evidence of an acute infarct, acute hemorrhage, mass lesion, mass effect or hydrocephalus. Small area of encephalomalacia in the left occipital lobe, stable. Atrophy. Periventricular low attenuation. Vascular: No hyperdense vessel or unexpected calcification. Skull: Normal. Negative for fracture or focal lesion. Sinuses/Orbits: No acute finding. Other: None. IMPRESSION: 1. No acute intracranial abnormality. 2. Atrophy and chronic microvascular white matter  ischemic changes. 3. Small area of encephalomalacia in the left occipital lobe. Electronically Signed   By: Lorin Picket M.D.   On: 12/14/2015 10:31   Ct Chest Wo Contrast  Result Date: 12/14/2015 CLINICAL DATA:  Abdominal distention and pain. Shortness of breath. Decreased appetite. EXAM: CT CHEST, ABDOMEN AND PELVIS WITHOUT CONTRAST TECHNIQUE: Multidetector CT imaging of the chest, abdomen and pelvis was performed following the standard protocol without IV contrast. COMPARISON:  CT abdomen pelvis 12/01/2015 and CT chest 07/07/2013. FINDINGS: CT CHEST FINDINGS Cardiovascular: Atherosclerotic calcification of the arterial vasculature, including three-vessel involvement of the coronary arteries. Heart size normal. There is pericardial calcification, indicative of prior pericarditis. No pericardial effusion. Mediastinum/Nodes: No pathologically enlarged mediastinal or axillary lymph nodes. Hilar regions are difficult to definitively evaluate without IV contrast. Esophagus is grossly unremarkable. Lungs/Pleura: Mild  centrilobular emphysema. Rounded masslike consolidation in the medial aspect of the superior segment right lower lobe measures approximately 4.7 x 4.9 cm, is new from 07/07/2013, has internal ossific/calcific debris and obscures the extrapleural fat plane. Soft tissue extends around the adjacent vertebral body into the left extrapleural space. Trace right pleural effusion. Lungs are otherwise clear. Musculoskeletal: Lucency and destruction of the T7 vertebral body is seen with surrounding soft tissue. There is lysis within the anterior aspect of the T6 vertebral body as well. Airway is unremarkable. CT ABDOMEN PELVIS FINDINGS Hepatobiliary: Liver and gallbladder are unremarkable. No biliary ductal dilatation. Pancreas: Negative. Spleen: Negative. Adrenals/Urinary Tract: Adrenal glands are unremarkable. Inferior aspects of both kidneys are obscured by streak artifact from spinal hardware.  Low-attenuation lesions in the left kidney measure up to 2.0 cm and are difficult to further characterize without post-contrast imaging. Ureters are decompressed. There are scattered bladder diverticulum. Stomach/Bowel: Stomach, small bowel, appendix and colon are unremarkable. Vascular/Lymphatic: Atherosclerotic calcification of the arterial vasculature without abdominal aortic aneurysm. Postoperative changes are seen at the aortic bifurcation. No pathologically enlarged lymph nodes. Reproductive: Prostate is slightly enlarged and calcified. Other: No free fluid.  Mesenteries and peritoneum are unremarkable. Musculoskeletal: Postoperative changes in the lumbar spine. No additional worrisome lytic or sclerotic lesions. Bone graft harvest sites in the iliac wings. IMPRESSION: 1. Masslike consolidation in the right lower lobe with invasion of the extrapleural fat, worrisome for primary bronchogenic carcinoma. Lymphoma and sarcoma are considered less likely. 2. Lytic destruction and compression of the T7 vertebral body with surrounding soft tissue, consistent with a pathologic fracture. Minimal involvement of the T6 vertebral body. 3. Trace right pleural effusion. 4. No evidence of primary malignancy or metastatic disease in the abdomen or pelvis. 5.  Aortic atherosclerosis (ICD10-170.0). Electronically Signed   By: Lorin Picket M.D.   On: 12/14/2015 14:21   Ct Guided Needle Placement  Result Date: 12/18/2015 INDICATION: 75 year old male with a history of right lower lobe/posterior mediastinal mass with destruction of the T7 vertebral body. He also has new onset paraplegia. CT-guided biopsy is warranted to obtain tissue diagnosis. EXAM: CT GUIDANCE NEEDLE PLACEMENT MEDICATIONS: None. ANESTHESIA/SEDATION: Moderate (conscious) sedation was employed during this procedure. A total of Versed 2 mg and Fentanyl 100 mcg was administered intravenously. Moderate Sedation Time: 14 minutes. The patient's level of  consciousness and vital signs were monitored continuously by radiology nursing throughout the procedure under my direct supervision. FLUOROSCOPY TIME:  Fluoroscopy Time: 0 minutes 0 seconds (0 mGy). COMPLICATIONS: None immediate. PROCEDURE: Informed written consent was obtained from the patient after a thorough discussion of the procedural risks, benefits and alternatives. All questions were addressed. Maximal Sterile Barrier Technique was utilized including caps, mask, sterile gowns, sterile gloves, sterile drape, hand hygiene and skin antiseptic. A timeout was performed prior to the initiation of the procedure. A planning axial CT scan was performed. The infiltrative mass along the medial aspect of the right lower lobe directly invading the posterior mediastinum was successfully identified. A suitable skin entry site was selected and marked. The region was sterilely prepped and draped in the standard fashion using chlorhexidine skin prep. Local anesthesia was attained by infiltration with 1% lidocaine. A small dermatotomy was made. Under intermittent CT guidance, a 17 gauge introducer needle was advanced into the margin of the soft tissue mass. Multiple 18 gauge core biopsies were then coaxially obtained using the bio Pince automated biopsy device. Biopsy specimens were placed in saline and delivered to pathology for further analysis. Additional  20 gauge needle aspirate was performed and inject into a small amount of sterile saline for cultures. Post biopsy axial CT imaging demonstrates no evidence of pneumothorax, hemorrhage or other complicating feature. IMPRESSION: Technically successful CT-guided core biopsy of right lower lobe/ posterior mediastinal mass lesion. Signed, Criselda Peaches, MD [facility] Radiology Electronically Signed   By: Jacqulynn Cadet M.D.   On: 12/18/2015 12:58   Mr Thoracic Spine Wo Contrast  Result Date: 12/26/2015 CLINICAL  DATA:  Re- evaluation of known compression fracture, infection, abscess. EXAM: MRI THORACIC SPINE WITHOUT CONTRAST TECHNIQUE: Multiplanar, multisequence MR imaging of the thoracic spine was performed. No intravenous contrast was administered. COMPARISON:  The prior MRI from 12/16/2015. FINDINGS: Examination is somewhat limited as the patient was unable to tolerate the full length of the exam. No contrast was administered on this study. A Alignment: Alignment is stable with preservation of the normal thoracic kyphosis. No listhesis. Vertebrae: Again seen is abnormal T1 hypo intense signal intensity involving the T6 and T7 vertebral bodies with associated T2/STIR hyperintensity, compatible with edema. Overall, changes are relatively similar as compared to prior study. Again, changes are contiguous with the right posterior chest wall lesion. The chest wall lesion appears overall decreased in size with decreased inflammatory changes as compared to prior MRI. This lesion measures approximately 3.2 x 2.6 cm on today's study, previously 4.4 x 4.1 cm. While no contrast was administered on this exam, the previously noted dorsal enhancing collection extending from T5 through T8-9 also appears decreased in size from previous exam, most evident on sagittal T2 weighted sequence (series 5, image 9). This collection measures up to 5 mm in maximal thickness at the level of T7-8. Associated spinal canal stenosis with mass effect on the thecal sac is improved, although remains moderate to severe at the T7 level (series 10, image 27). Thecal sac measures 8 mm in AP diameter at this level. Patchy T2 signal abnormality within the cord at T7 and T8 again seen, worrisome for myelopathy. Compression deformity involving the T7 vertebral body is relatively stable with associated approximately 50% height loss and 4 mm bony retropulsion. No other new finding within the thoracic spine. Degenerative disc bulge noted at T12-L1 without stenosis.  Layering left pleural effusion noted. IMPRESSION: 1. Similar appearance of abnormal signal intensity involving the T6 and T7 vertebral bodies with associated T7 compression fracture, with direct extension into the right lower lobe pulmonary process. Overall, inflammatory changes within the right lower lobe are improved, with decreased size of right lower lobe lesion as compared to prior MRI from 12/16/2015. Given the interval improvement, changes suggest an improving infectious process. T7 compression fracture with 50% height loss and 4 mm bony retropulsion is stable. 2. 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 3. Overall improvement in previously seen spinal canal stenosis, although there remains persistent moderate to severe stenosis at the level of T7. 4. 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 5. Layering left pleural effusion. Electronically Signed   By: Jeannine Boga M.D.   On: 12/26/2015 23:38   Mr Thoracic Spine W Wo Contrast  Result Date: 12/16/2015 CLINICAL DATA:  Invasive thoracic mass. Bilateral lower extremity anesthesia. EXAM: MRI THORACIC SPINE WITHOUT AND WITH CONTRAST TECHNIQUE: Multiplanar and multiecho pulse sequences of the thoracic spine were obtained without and with intravenous contrast. CONTRAST:  70mL MULTIHANCE GADOBENATE DIMEGLUMINE 529 MG/ML IV SOLN  COMPARISON:  Chest CT 12/14/2015 FINDINGS: There is loss of the normal T1 weighted signal within the T6 and T7 vertebral bodies with associated contrast enhancement. This is in continuity with a posterior right chest wall mass that is better characterized on the recent chest CT. On the current examination, this mass measures approximately 4.4 x 4.1 cm. There is a peripherally enhancing collection along the dorsal aspect of the spinal canal extending from the level of the T5-T6  disc to the T9 level, measuring up to 5 mm in thickness. This contributes to severe attenuation of the thecal sac and compression of the spinal cord. There is increased T2 weighted signal within the spinal cord at the T7 and T8 levels suggesting compressive myelopathy. Additionally, there is compression deformity of the T7 body with approximately 50% height loss centrally and mild retropulsion. The other thoracic levels are unremarkable without spinal canal stenosis. IMPRESSION: 1. Abnormal signal and contrast enhancement of the T6 and T7 vertebral bodies with associated T7 compression fracture. This is likely secondary to direct extension of the process within the right lower lobe. While this may be extension of a pulmonary malignancy invading the vertebral column, an infectious process of the lung with an associated discitis-osteomyelitis is also a possibility. Given this constellation of findings, atypical infectious processes (such as tuberculosis) should be considered. 2. 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 3. 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 Critical Value/emergent results were called by telephone at the time of interpretation on 12/16/2015 at 6:53 pm to Dr. Raiford Noble , who verbally acknowledged these results. Electronically Signed   By: Ulyses Jarred M.D.   On: 12/16/2015 18:54   Ir Angiogram Visceral Selective  Result Date: 12/27/2015 CLINICAL DATA:  GI bleed. Endoscopy suggests proximal jejunal source. Known duodenal ulcer, but no active bleeding seen on recent endoscopy. EXAM: SELECTIVE VISCERAL ARTERIOGRAPHY; IR ULTRASOUND GUIDANCE VASC ACCESS RIGHT ANESTHESIA/SEDATION: Intravenous Fentanyl and Versed were administered as  conscious sedation during continuous monitoring of the patient's level of consciousness and physiological / cardiorespiratory status by the radiology RN, with a total moderate sedation time of 32 minutes. MEDICATIONS: Lidocaine 1% subcutaneous PROCEDURE: The procedure, risks (including but not limited to bleeding, infection, organ damage ), benefits, and alternatives were explained to the patient. Questions regarding the procedure were encouraged and answered. The patient understands and consents to the procedure. Right femoral region prepped and draped in usual sterile fashion. Maximal barrier sterile technique was utilized including caps, mask, sterile gowns, sterile gloves, sterile drape, hand hygiene and skin antiseptic. The right common femoral artery was localized under ultrasound. Under real-time ultrasound guidance, the vessel was accessed with a 21-gauge micropuncture needle, exchanged over a 018 guidewire for a transitional dilator, through which a 035 guidewire was advanced. Over this, a 5 [place] vascular sheath was placed, through which a 5 [place] C2 catheter was advanced and used to selectively catheterize the celiac axis for selective arteriography in multiple projections. The C2 was then utilized to selectively catheterize the superior mesenteric artery for selective arteriography in multiple projections. The C2 was then exchanged for a RIM catheter, utilized to selectively catheterize the inferior mesenteric artery for selective arteriography in 2 projections. The catheter and sheath were removed and hemostasis achieved with the aid of  the Exoseal device after confirmatory femoral arteriography. The patient tolerated the procedure well. COMPLICATIONS: None immediate FINDINGS: No evidence of active extravasation, early draining vein, AVM, or other lesion to suggest a site or etiology of the patient's GI bleeding. 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  Venous phase confirms patency of the IMV and portal venous system. IMPRESSION: 1. Negative three-vessel mesenteric arteriogram. No evidence of active extravasation or other focal lesion to suggest etiology of GI bleed. Electronically Signed   By: Lucrezia Europe M.D.   On: 12/27/2015 16:56   Ir Angiogram Visceral Selective  Result Date: 12/27/2015 CLINICAL DATA:  GI bleed. Endoscopy suggests proximal jejunal source. Known duodenal ulcer, but no active bleeding seen on recent endoscopy. EXAM: SELECTIVE VISCERAL ARTERIOGRAPHY; IR ULTRASOUND GUIDANCE VASC ACCESS RIGHT ANESTHESIA/SEDATION: Intravenous Fentanyl and Versed were administered as conscious sedation during continuous monitoring of the patient's level of consciousness and physiological / cardiorespiratory status by the radiology RN, with a total moderate sedation time of 32 minutes. MEDICATIONS: Lidocaine 1% subcutaneous PROCEDURE: The procedure, risks (including but not limited to bleeding, infection, organ damage ), benefits, and alternatives were explained to the patient. Questions regarding the procedure were encouraged and answered. The patient understands and consents to the procedure. Right femoral region prepped and draped in usual sterile fashion. Maximal barrier sterile technique was utilized including caps, mask, sterile gowns, sterile gloves, sterile drape, hand hygiene and skin antiseptic. The right common femoral artery was localized under ultrasound. Under real-time ultrasound guidance, the vessel was accessed with a 21-gauge micropuncture needle, exchanged over a 018 guidewire for a transitional dilator, through which a 035 guidewire was advanced. Over this, a 5 [place] vascular sheath was placed, through which a 5 [place] C2 catheter was advanced and used to selectively catheterize the celiac axis for selective arteriography in multiple projections. The C2 was then utilized to selectively catheterize the superior mesenteric artery for selective  arteriography in multiple projections. The C2 was then exchanged for a RIM catheter, utilized to selectively catheterize the inferior mesenteric artery for selective arteriography in 2 projections. The catheter and sheath were removed and hemostasis achieved with the aid of the Exoseal device after confirmatory femoral arteriography. The patient tolerated the procedure well. COMPLICATIONS: None immediate FINDINGS: No evidence of active extravasation, early draining vein, AVM, or other lesion to suggest a site or etiology of the patient's GI bleeding. 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 Venous phase confirms patency of the IMV and portal venous system. IMPRESSION: 1. Negative three-vessel mesenteric arteriogram. No evidence of active extravasation or other focal lesion to suggest etiology of GI bleed. Electronically Signed   By: Lucrezia Europe M.D.   On: 12/27/2015 16:56   Ir Angiogram Visceral Selective  Result Date: 12/27/2015 CLINICAL DATA:  GI bleed. Endoscopy suggests proximal jejunal source. Known duodenal ulcer, but no active bleeding seen on recent endoscopy. EXAM: SELECTIVE VISCERAL ARTERIOGRAPHY; IR ULTRASOUND GUIDANCE VASC ACCESS RIGHT ANESTHESIA/SEDATION: Intravenous Fentanyl and Versed were administered as conscious sedation during continuous monitoring of the patient's level of consciousness and physiological / cardiorespiratory status by the radiology RN, with a total moderate sedation time of 32 minutes. MEDICATIONS: Lidocaine 1% subcutaneous PROCEDURE: The procedure, risks (including but not limited to bleeding, infection, organ damage ), benefits, and alternatives were explained to the patient. Questions regarding the procedure were encouraged and answered. The patient understands and consents to the procedure. Right femoral region prepped and draped  in usual sterile fashion. Maximal barrier sterile technique was utilized including caps, mask, sterile  gowns, sterile gloves, sterile drape, hand hygiene and skin antiseptic. The right common femoral artery was localized under ultrasound. Under real-time ultrasound guidance, the vessel was accessed with a 21-gauge micropuncture needle, exchanged over a 018 guidewire for a transitional dilator, through which a 035 guidewire was advanced. Over this, a 5 [place] vascular sheath was placed, through which a 5 [place] C2 catheter was advanced and used to selectively catheterize the celiac axis for selective arteriography in multiple projections. The C2 was then utilized to selectively catheterize the superior mesenteric artery for selective arteriography in multiple projections. The C2 was then exchanged for a RIM catheter, utilized to selectively catheterize the inferior mesenteric artery for selective arteriography in 2 projections. The catheter and sheath were removed and hemostasis achieved with the aid of the Exoseal device after confirmatory femoral arteriography. The patient tolerated the procedure well. COMPLICATIONS: None immediate FINDINGS: No evidence of active extravasation, early draining vein, AVM, or other lesion to suggest a site or etiology of the patient's GI bleeding. 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 Venous phase confirms patency of the IMV and portal venous system. IMPRESSION: 1. Negative three-vessel mesenteric arteriogram. No evidence of active extravasation or other focal lesion to suggest etiology of GI bleed. Electronically Signed   By: Lucrezia Europe M.D.   On: 12/27/2015 16:56   Ir US Guide Vasc Access Right  Result Date: 12/27/2015 CLINICAL DATA:  GI bleed. Endoscopy suggests proximal jejunal source. Known duodenal ulcer, but no active bleeding seen on recent endoscopy. EXAM: SELECTIVE VISCERAL ARTERIOGRAPHY; IR ULTRASOUND GUIDANCE VASC ACCESS RIGHT ANESTHESIA/SEDATION: Intravenous Fentanyl and Versed were administered as conscious sedation  during continuous monitoring of the patient's level of consciousness and physiological / cardiorespiratory status by the radiology RN, with a total moderate sedation time of 32 minutes. MEDICATIONS: Lidocaine 1% subcutaneous PROCEDURE: The procedure, risks (including but not limited to bleeding, infection, organ damage ), benefits, and alternatives were explained to the patient. Questions regarding the procedure were encouraged and answered. The patient understands and consents to the procedure. Right femoral region prepped and draped in usual sterile fashion. Maximal barrier sterile technique was utilized including caps, mask, sterile gowns, sterile gloves, sterile drape, hand hygiene and skin antiseptic. The right common femoral artery was localized under ultrasound. Under real-time ultrasound guidance, the vessel was accessed with a 21-gauge micropuncture needle, exchanged over a 018 guidewire for a transitional dilator, through which a 035 guidewire was advanced. Over this, a 5 [place] vascular sheath was placed, through which a 5 [place] C2 catheter was advanced and used to selectively catheterize the celiac axis for selective arteriography in multiple projections. The C2 was then utilized to selectively catheterize the superior mesenteric artery for selective arteriography in multiple projections. The C2 was then exchanged for a RIM catheter, utilized to selectively catheterize the inferior mesenteric artery for selective arteriography in 2 projections. The catheter and sheath were removed and hemostasis achieved with the aid of the Exoseal device after confirmatory femoral arteriography. The patient tolerated the procedure well. COMPLICATIONS: None immediate FINDINGS: No evidence of active extravasation, early draining vein, AVM, or other lesion to suggest a site or etiology of the patient's GI bleeding. 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 Venous phase confirms  patency of the IMV and portal venous system. IMPRESSION: 1. Negative three-vessel mesenteric arteriogram. No evidence of active  extravasation or other focal lesion to suggest etiology of GI bleed. Electronically Signed   By: Lucrezia Europe M.D.   On: 12/27/2015 16:56   Dg Chest Port 1 View  Result Date: 12/27/2015 CLINICAL DATA:  Shortness of breath.  Post EGD for GI bleed. EXAM: PORTABLE CHEST 1 VIEW COMPARISON:  12/17/2015 and CT chest 12/14/2015 FINDINGS: Interval placement of right-sided PICC line with tip over the SVC. Lungs are adequately inflated with moderate interval improvement in the airspace process over the medial superior segment right lower lobe. No effusion. Cardiomediastinal silhouette is within normal. There is minimal calcified plaque over the aortic arch. Remainder of the exam is unchanged. IMPRESSION: Interval improvement in airspace process over the medial aspect superior segment right lower lobe likely resolving pneumonia. Right-sided PICC line with tip over the SVC. Aortic atherosclerosis. Electronically Signed   By: Marin Olp M.D.   On: 12/27/2015 14:02   Dg Chest Port 1 View  Result Date: 12/14/2015 CLINICAL DATA:  75 year old male with a history of chest pain EXAM: PORTABLE CHEST 1 VIEW COMPARISON:  11/28/2015 FINDINGS: Double density in the right hilum, new from the comparison. No pleural effusion or pneumothorax. No confluent airspace disease. No displaced fracture. IMPRESSION: Double density in the right hilum, new from the comparison plain film in September. Given the rapid development, this may reflect a developing infection in the medial lung, however, contrast-enhanced CT is recommended to rule out adenopathy or mass. Signed, Dulcy Fanny. Earleen Newport, DO [facility] Electronically Signed   By: Corrie Mckusick D.O.   On: 12/14/2015 09:28    Labs:  CBC:  Recent Labs  01/04/16 1457 01/04/16 1757 01/05/16 0716  01/06/16 0425  WBC 1.8* 1.9* 2.0* 1.8*  HGB 6.4* 7.0* 8.4* 7.7*  HCT 19.6* 21.6* 25.3* 23.1*  PLT 46* 47* 35* 33*    COAGS:  Recent Labs  12/14/15 1721 12/29/15 2030  INR 1.04 1.16    BMP:  Recent Labs  12/30/15 0456 12/31/15 0815 01/02/16 0450 01/04/16 0915  NA 138 138 138 140  K 4.2 4.3 4.2 3.8  CL 110 110 109 111  CO2 24 24 25 24   GLUCOSE 116* 114* 111* 109*  BUN 31* 27* 21* 22*  CALCIUM 7.2* 7.3* 7.1* 7.4*  CREATININE 0.69 0.63 0.51* 0.50*  GFRNONAA >60 >60 >60 >60  GFRAA >60 >60 >60 >60    LIVER FUNCTION TESTS:  Recent Labs  12/28/15 0510 12/29/15 0453 12/30/15 0456 12/31/15 0815  BILITOT 0.8 0.8 0.8 0.5  AST 28 27 26  32  ALT 27 23 21 26   ALKPHOS 65 56 53 59  PROT 4.0* 3.8* 3.8* 3.7*  ALBUMIN 1.9* 1.7* 1.6* 1.5*    TUMOR MARKERS: No results for input(s): AFPTM, CEA, CA199, CHROMGRNA in the last 8760 hours.  Assessment and Plan:  Paraplegia T7 fx--+ osteomyelitis New DVT LLE Cannot anticoagulate secondary GI Bleed High risk with paraplegia Scheduled for retrievable IVC filter Risks and Benefits discussed with the patient including, but not limited to bleeding, infection, contrast induced renal failure, filter fracture or migration which can lead to emergency surgery or even death, strut penetration with damage or irritation to adjacent structures and caval thrombosis. All of the patient's questions were answered, patient is agreeable to proceed. Consent signed and in chart.  Allergic to contrast: premedicated for 1000 am  plt 33 today; check labs   Thank you for this interesting consult.  I greatly enjoyed meeting Marcus [name] and look  forward to participating in their care.  A copy of this report was sent to the requesting provider on this date.  Electronically Signed: Monia Sabal A 01/06/2016, 3:02 PM   I spent a total of 40 Minutes    in face to face in clinical consultation, greater than 50% of which was  counseling/coordinating care for retrievable inferior vena cava filter placement

## 2016-01-07 ENCOUNTER — Inpatient Hospital Stay (HOSPITAL_COMMUNITY): Payer: Medicare Other

## 2016-01-07 ENCOUNTER — Ambulatory Visit: Payer: Medicare Other

## 2016-01-07 ENCOUNTER — Encounter: Payer: Self-pay | Admitting: Internal Medicine

## 2016-01-07 DIAGNOSIS — R1012 Left upper quadrant pain: Secondary | ICD-10-CM

## 2016-01-07 DIAGNOSIS — D696 Thrombocytopenia, unspecified: Secondary | ICD-10-CM

## 2016-01-07 DIAGNOSIS — D72819 Decreased white blood cell count, unspecified: Secondary | ICD-10-CM

## 2016-01-07 DIAGNOSIS — D61818 Other pancytopenia: Secondary | ICD-10-CM

## 2016-01-07 DIAGNOSIS — Z66 Do not resuscitate: Secondary | ICD-10-CM

## 2016-01-07 LAB — BASIC METABOLIC PANEL
Anion gap: 6 (ref 5–15)
BUN: 23 mg/dL — AB (ref 6–20)
CALCIUM: 7 mg/dL — AB (ref 8.9–10.3)
CHLORIDE: 109 mmol/L (ref 101–111)
CO2: 22 mmol/L (ref 22–32)
CREATININE: 0.53 mg/dL — AB (ref 0.61–1.24)
GFR calc non Af Amer: >60 mL/min (ref >60)
GLUCOSE: 116 mg/dL — AB (ref 65–99)
Potassium: 3.6 mmol/L (ref 3.5–5.1)
Sodium: 137 mmol/L (ref 135–145)

## 2016-01-07 LAB — CBC
HCT: 20.5 % — ABNORMAL LOW (ref 39.0–52.0)
Hemoglobin: 6.9 g/dL — CL (ref 13.0–17.0)
MCH: 30.4 pg (ref 26.0–34.0)
MCHC: 33.7 g/dL (ref 30.0–36.0)
MCV: 90.3 fL (ref 78.0–100.0)
PLATELETS: 25 10*3/uL — AB (ref 150–400)
RBC: 2.27 MIL/uL — AB (ref 4.22–5.81)
RDW: 16.7 % — AB (ref 11.5–15.5)
WBC: 0.8 10*3/uL — CL (ref 4.0–10.5)

## 2016-01-07 LAB — HEMOGLOBIN AND HEMATOCRIT, BLOOD
HCT: 24.9 % — ABNORMAL LOW (ref 39.0–52.0)
Hemoglobin: 8.5 g/dL — ABNORMAL LOW (ref 13.0–17.0)

## 2016-01-07 LAB — PROTIME-INR
INR: 1.08
PROTHROMBIN TIME: 14.1 s (ref 11.4–15.2)

## 2016-01-07 LAB — RETICULOCYTES
RBC.: 3.36 MIL/uL — AB (ref 4.22–5.81)
RETIC COUNT ABSOLUTE: 16.8 10*3/uL — AB (ref 19.0–186.0)
RETIC CT PCT: 0.5 % (ref 0.4–3.1)

## 2016-01-07 LAB — GLUCOSE, CAPILLARY
GLUCOSE-CAPILLARY: 141 mg/dL — AB (ref 65–99)
GLUCOSE-CAPILLARY: 126 mg/dL — AB (ref 65–99)

## 2016-01-07 LAB — PREPARE RBC (CROSSMATCH)

## 2016-01-07 LAB — HAPTOGLOBIN: Haptoglobin: 389 mg/dL — ABNORMAL HIGH (ref 34–200)

## 2016-01-07 MED ORDER — IOPAMIDOL (ISOVUE-300) INJECTION 61%
INTRAVENOUS | Status: AC
  Filled 2016-01-07: qty 100

## 2016-01-07 MED ORDER — LORAZEPAM 0.5 MG PO TABS
0.5000 mg | ORAL_TABLET | Freq: Three times a day (TID) | ORAL | Status: DC | PRN
  Administered 2016-01-09 – 2016-01-10 (×2): 0.5 mg via ORAL
  Filled 2016-01-07 (×2): qty 1

## 2016-01-07 MED ORDER — SENNOSIDES-DOCUSATE SODIUM 8.6-50 MG PO TABS
1.0000 | ORAL_TABLET | Freq: Every day | ORAL | Status: DC
  Administered 2016-01-07 – 2016-01-09 (×3): 1 via ORAL
  Filled 2016-01-07 (×3): qty 1

## 2016-01-07 MED ORDER — LORAZEPAM 0.5 MG PO TABS: 0.5000 mg | ORAL_TABLET | Freq: Two times a day (BID) | ORAL | Status: DC | PRN

## 2016-01-07 MED ORDER — SODIUM CHLORIDE 0.9 % IV SOLN
Freq: Once | INTRAVENOUS | Status: AC
  Administered 2016-01-07: 05:00:00 via INTRAVENOUS

## 2016-01-07 MED ORDER — LIDOCAINE HCL 1 % IJ SOLN
INTRAMUSCULAR | Status: AC
  Filled 2016-01-07: qty 20

## 2016-01-07 MED ORDER — FENTANYL CITRATE (PF) 100 MCG/2ML IJ SOLN
50.0000 ug | INTRAMUSCULAR | Status: DC | PRN
Start: 2016-01-07 — End: 2016-01-09
  Administered 2016-01-07 (×5): 100 ug via INTRAVENOUS
  Administered 2016-01-07: 50 ug via INTRAVENOUS
  Administered 2016-01-08 – 2016-01-09 (×6): 100 ug via INTRAVENOUS
  Administered 2016-01-09: 50 ug via INTRAVENOUS
  Administered 2016-01-09: 100 ug via INTRAVENOUS
  Filled 2016-01-07 (×16): qty 2

## 2016-01-07 MED ORDER — FENTANYL 25 MCG/HR TD PT72
75.0000 ug | MEDICATED_PATCH | TRANSDERMAL | Status: DC
  Administered 2016-01-07 – 2016-01-10 (×2): 75 ug via TRANSDERMAL
  Filled 2016-01-07 (×2): qty 1

## 2016-01-07 MED ORDER — DEXAMETHASONE SODIUM PHOSPHATE 4 MG/ML IJ SOLN
2.0000 mg | INTRAMUSCULAR | Status: DC
  Administered 2016-01-08 – 2016-01-10 (×3): 2 mg via INTRAVENOUS
  Filled 2016-01-07 (×3): qty 1

## 2016-01-07 MED ORDER — FENTANYL CITRATE (PF) 100 MCG/2ML IJ SOLN
100.0000 ug | Freq: Every day | INTRAMUSCULAR | Status: DC
  Administered 2016-01-07 – 2016-01-09 (×3): 100 ug via INTRAVENOUS
  Filled 2016-01-07 (×3): qty 2

## 2016-01-07 MED ORDER — FILGRASTIM 480 MCG/1.6ML IJ SOLN
480.0000 ug | Freq: Every day | INTRAMUSCULAR | Status: DC
  Administered 2016-01-08: 480 ug via SUBCUTANEOUS
  Filled 2016-01-07 (×3): qty 1.6

## 2016-01-07 MED ORDER — LORAZEPAM 0.5 MG PO TABS
0.5000 mg | ORAL_TABLET | Freq: Every day | ORAL | Status: DC
  Administered 2016-01-07 – 2016-01-09 (×3): 0.5 mg via ORAL
  Filled 2016-01-07 (×3): qty 1

## 2016-01-07 NOTE — Progress Notes (Addendum)
Unfortunately, his blood counts continued to worsen. It is as if his bone marrow has decreased its functioning. His white cell count is 0.8. His platelet was 25,000. Hemoglobin is 6.9.  I looked at his blood smear to the microscope. I do not see anything that looked like marrow infiltration. He had a rare nucleated red blood cell. He had some rare teardrop cells. He may have some slight rouleaux formation. His white blood cells appeared normal. He had no hypersegmented polys. I saw no immature myeloid cells. There are no atypical lymphocytes. Platelets had good granulation.  He really needs a bone marrow biopsy but he'll not agree to this. I talked to him this morning regarding this.  I think we can at least use some Neupogen on him.  I don't think he is hemolyzing at all.  We did iron studies on him on Sunday. His iron saturation was 30%. Serum iron was 46. He has a normal LDH. His electrolytes have not been all that bad. His thyroid function looked okay with a TSH of 2.0.  He is on an incredible high dose of prednisone. He gets 3 doses.  I don't see anything with his labs that looked suspicious. I'm sure the Zyprexa can cause bone marrow suppression. However, it looks like his blood counts were dropping  before this.  This is quite puzzling area and I am truly worried regarding this. Again, I just don't think that he would agree to a bone marrow test. It is hard to say how competent he is. He does have some periods of lucidity.  There is nothing on his physical exam that is suspicious. His blood pressure is 106/51. His pulses 59. He is afebrile.  Blood cultures done yesterday.  This is a tough situation. We are very limited as to what we can do invasively.  Again I'll put him on some Neupogen the CPK is white cell count up a little better.  Thankfully, he had no obvious bleeding. His hemoglobin has dropped so this might be marrow suppression. I want to check a reticulocyte count on  him.  We will follow along.  Lattie Haw, MD  Psalm 139:14

## 2016-01-07 NOTE — Clinical Social Work Note (Signed)
CSW notes that the patient and family do not want to pursue SNF placement. Please re-consult for placement needs if the patient and family change their minds regarding this. CSW notes that CIR is following for possible return to [facility].  Liz Beach MSW, [place], [place], [id number]

## 2016-01-07 NOTE — Progress Notes (Signed)
Responded to page. Pt in rm. At nurse's suggestion, I joined family in consultation rm with palliative nurse before seeing pt: wife of 49 yrs, and 2 daughters. One had just arrived, and the other (Education officer, museum) and wife/mom had been around hospital for many recent days. Pt's current problems began in late August, and he's been in this time since mid-Oct. Wife was articulate about it's being very difficult to accept that he is now going, is tired and now wants to go, is even looking forward to going -- esp. b/c she didn't believe at several points that different issues arose that any by itself should have been enough to leave him in his current situation. The family was glad I'd joined their discussion, and was frank at the outset (that I heard) about their questioning whether they should continue trying to force pt to do things -- esp. since he'd recently started refusing treatments. The daughter who'd just arrived said that's what would keep happening, and they shouldn't force him. Her mom, crying, understood, was beginning to accept that, but it was still very difficult. The other daughter recounted how he was reporting seeing things like angels and Jesus, and, after a while when he seemed to be speaking rationally about it, they'd stopped saying he was delusional to say such things and started entertaining the notion that such things were happening for him. At this point, the wife asked me to go talk to pt. When I did, his nurse was treating him and joined our spiritual/ emotional support discussion/prayer. Pt seemed at peace and cheerful when I entered, and later reiterated to Jesus during the prayer that pt just wanted to serve him. Pt began telling me what he was seeing traveling through the sky, and then that he was waiting for Loma Sousa (the daughter who'd just arrived). I told him she was here, and withdrew to let her know he wanted to see her. In the consultation room, the wife and 2 daughters were now  more in consensus with pt, that this is how it was for him, he was ready to move on, and, hard as it was (wife was still crying), they'd have to let him go. I left them then with palliative nurse to answer another call. Chaplain available for follow-up.   01/07/16 1000  Clinical Encounter Type  Visited With Patient;Family;Health care provider  Visit Type Initial;Psychological support;Spiritual support;Social support;Other (Comment) (palliative)  Referral From Nurse  Spiritual Encounters  Spiritual Needs Prayer;Emotional;Grief support  Stress Factors  Patient Stress Factors Health changes  Family Stress Factors Exhausted;Family relationships;Health changes;Loss;Loss of control;Major life changes

## 2016-01-07 NOTE — Consult Note (Addendum)
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  Pt states he does not want his wounds assessed or the dressing changed and "he would like to have comfort care goals for this location, and realizes this wound will probably decline, but he does not care about that; I want everyone to leave it alone." Will discontinue Santyl and use foam dressing to the site; this will be in alignment with comfort care goals. Pt has air mattress on the bed for pressure reduction and increased comfort related to limited mobility. Physician at the beside during this conversation. Please re-consult if further assistance is needed.  Thank-you,  Julien Girt MSN, [place], [place], [place], [place]

## 2016-01-07 NOTE — Progress Notes (Addendum)
Daily Progress Note   Patient Name: Marcus [name]       Date: 01/07/2016 DOB: June 02, 1940  Age: 75 y.o. MRN#: [id number] Attending Physician: Mariel Aloe, MD Primary Care Physician: Jeanmarie Hubert, MD Admit Date: 12/25/2015  Reason for Consultation/Follow-up: Establishing goals of care, Non pain symptom management, Pain control and Psychosocial/spiritual support  Subjective: Long and difficult conversation today first with [name] wife and 2 daughters at bedside. Wife is distraught as he is refusing further intervention and telling them that he is ready to die. Family discussing options and 1 daughter and myself reinforce that [name] is in his right mind and understands the consequences of his decisions - we have no other choice but to respect his wishes. Family struggling with this fact as they do not want him to die but wife is able to acknowledge that they also do not want him to suffer. Chaplain joins [place] during this discussion and support appreciated.   We discussed further with [name] who is clear in his wishes. He is tired and tired of being in pain and suffering. I reassure him that we will not continue to put him through tests and sticks and interventions and will liberalize his pain medication in order to respect his wishes and focusing on comfort care. He responds with tears and exclaiming "yes - there is a God." He performs brief life review and expresses that he wishes his family to take care of each other and that he is happy and blessed. He says he has no regrets. He is very spiritual and finds peace in knowing that he will be better off in heaven. As I was leaving family to spend time together he was expressing his wishes for his funeral.   I will continue to follow to  ensure comfort and support especially for his family. We will continue to discuss the benefit vs risk of continuing labs, blood transfusion, antibiotics but will continue these for now.   Length of Stay: 13  Current Medications: Scheduled Meds:  . calcitonin (salmon)  1 spray Alternating Nares Daily  . ceFTAROline (TEFLARO) IV  600 mg Intravenous Q12H  . [START ON 01/08/2016] dexamethasone  2 mg Intravenous Q24H  . diltiazem  180 mg Oral Daily  . feeding supplement (PRO-STAT SUGAR FREE 64)  30  mL Oral TID BM  . fentaNYL  75 mcg Transdermal Q72H  . filgrastim  480 mcg Subcutaneous Daily  . fluconazole  100 mg Oral Daily  . OLANZapine  5 mg Oral QHS  . pantoprazole  40 mg Oral BID  . senna-docusate  1 tablet Oral QHS  . sodium chloride flush  3 mL Intravenous Q12H  . timolol  1 drop Both Eyes BID    Continuous Infusions: . lactated ringers Stopped (12/29/15 0700)    PRN Meds: acetaminophen **OR** acetaminophen, alum & mag hydroxide-simeth, feeding supplement (ENSURE ENLIVE), fentaNYL (SUBLIMAZE) injection, ipratropium-albuterol, LORazepam, magic mouthwash, nicotine polacrilex, ondansetron (ZOFRAN) IV, promethazine **OR** promethazine **OR** promethazine, sodium chloride flush, traZODone  Physical Exam  Constitutional: He is oriented to person, place, and time. He appears well-developed and well-nourished.  HENT:  Head: Normocephalic and atraumatic.  Cardiovascular: Normal rate and regular rhythm.   Pulmonary/Chest: Effort normal. No accessory muscle usage. No tachypnea. No respiratory distress.  Abdominal: Soft. Normal appearance.  Genitourinary:  Genitourinary Comments: Foley catheter  Neurological: He is alert and oriented to person, place, and time.  Skin: Skin is warm and dry.  Psychiatric: His mood appears not anxious. He is not agitated. He is attentive.  Nursing note and vitals reviewed.           Vital Signs: BP (!) 106/51   Pulse (!) 59   Temp 98.2 F (36.8 C)  (Oral)   Resp 16   Ht 5\' 7"  (1.702 m)   Wt 97.4 kg (214 lb 11.2 oz)   SpO2 99%   BMI 33.63 kg/m  SpO2: SpO2: 99 % O2 Device: O2 Device: Not Delivered O2 Flow Rate: O2 Flow Rate (L/min): 1 L/min  Intake/output summary:   Intake/Output Summary (Last 24 hours) at 01/07/16 1246 Last data filed at 01/07/16 0800  Gross per 24 hour  Intake              784 ml  Output             1500 ml  Net             -716 ml   LBM: Last BM Date: 01/06/16 Baseline Weight: Weight: 94.1 kg (207 lb 8 oz) Most recent weight: Weight: 97.4 kg (214 lb 11.2 oz)       Palliative Assessment/Data: PPS: 20%   Flowsheet Rows   Flowsheet Row Most Recent Value  Intake Tab  Referral Department  Hospitalist  Unit at Time of Referral  Med/Surg Unit  Palliative Care Primary Diagnosis  Sepsis/Infectious Disease  Date Notified  01/03/16  Palliative Care Type  New Palliative care  Reason for referral  Pain, Clarify Goals of Care, Psychosocial or Spiritual support, Non-pain Symptom  Date of Admission  12/14/15  Date first seen by Palliative Care  01/04/16  # of days Palliative referral response time  1 Day(s)  # of days IP prior to Palliative referral  20  Clinical Assessment  Palliative Performance Scale Score  30%  Pain Max last 24 hours  Not able to report  Pain Min Last 24 hours  Not able to report  Dyspnea Max Last 24 Hours  Not able to report  Dyspnea Min Last 24 hours  Not able to report  Nausea Max Last 24 Hours  Not able to report  Nausea Min Last 24 Hours  Not able to report  Anxiety Max Last 24 Hours  Not able to report  Other Max Last 24 Hours  Not  able to report  Psychosocial & Spiritual Assessment  Palliative Care Outcomes  Palliative Care follow-up planned  Yes, Facility      Patient Active Problem List   Diagnosis Date Noted  . Pancytopenia ([place]) 01/06/2016  . Uncontrolled pain   . Compression fracture of thoracic spine, non-traumatic ([place])   . Palliative care encounter   .  Inadequate pain control 01/02/2016  . Acute confusional state 01/02/2016  . Leukocytosis   . Myopathy   . Other abnormalities of gait and mobility   . Left leg DVT ([place]) 12/25/2015  . Acute blood loss anemia 12/25/2015  . Gastrointestinal hemorrhage associated with duodenal ulcer 12/25/2015  . Melena   . 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 Paraplegia ([place]) 12/24/2015  . Neurogenic bowel   . Neurogenic bladder   . Hyperlipidemia   . 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 MRSA infection 12/23/2015  . MSSA (methicillin susceptible Staphylococcus aureus) septicemia ([place]) 12/23/2015  . Pressure injury of skin 12/19/2015  . Closed fracture of seventh thoracic vertebra ([place])   . T7 vertebral fracture ([place])   . Decreased sensation   . Atrial fibrillation with RVR ([place]) 12/14/2015  . Fall 12/14/2015  . Community acquired pneumonia of left lung 12/14/2015  . Compression fracture of vertebra ([place]) 12/14/2015  . Urinary retention 12/14/2015  . PAF (paroxysmal atrial fibrillation) ([place]) 12/14/2015  . Lung mass 12/14/2015  . Lower extremity weakness 12/14/2015  . SOB (shortness of breath) 11/12/2015  . Chest pain 11/12/2015  . S/P carotid endarterectomy 04/10/2015  . 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 Glaucoma 03/20/2015  . Diabetes ([place]) 10/10/2014  . HLD (hyperlipidemia) 10/10/2014  . Cerebral infarction due to embolism of left carotid artery ([place])   . Essential hypertension   . Carotid stenosis   . TIA (transient ischemic attack) 06/01/2014  . Malaise and fatigue 11/08/2013  . Lumbago   . Benign essential tremor   . Vitamin D deficiency     Palliative Care Assessment & Plan   Patient Profile: 75 y.o. male  with past medical history of Atrial fibrillation, hypertension, diabetes, CVA, GI bleed admitted on 12/25/2015 with generalized weakness, failure to thrive, after a recent fall. He was initially admitted to the  hospital on 12/14/2015 then was transferred to inpatient rehabilitation, and now is readmitted to the hospitalist service on 12/25/2015. His initial chest x-ray on 12/14/2015 revealed community-acquired pneumonia as well as findings of a right lower lobe mass, closed fracture of T7 vertebrae. Patient's biopsy of right lower lobe mass revealed inflammatory changes. 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 He is being treated with antibiotics. Despite supportive therapy, antibiotics extensive workup his mood has remained very labile, ongoing reports of pain, as well as inability to move his lower extremities.. He has been seen by psychiatry and felt to have capacity.  Additionally he has thrombocytopenia, platelets 62, and an albumin level of 1.5 On 11 07/02/2015 he has developed pancytopenia, white blood cell count 1.9; hemoglobin 7.0 after being transfused 1 unit (was 6.4 on 01/04/2016), platelets 47. Oncology consult placed to Dr. Burney Gauze who did see patient on 01/05/2016. Labs continue to worsen with unknown etiology.    Recommendations/Plan:  Comfort care  Pain: Fentanyl transdermal patch increase to 75 g every 72 hours and fentanyl IV increased to 50-100 g every 1 hour as needed for breakthrough pain.    Acute  confusional state/delirium: Resolved.   Anxiety: Continue Ativan 0.5 mg TID as needed.  Insomnia: Says trazodone does not help at all. Says that Ativan helps and pain is main reason for keeping him awake. Plan to receive dose of Ativan and fentanyl tonight at bedtime to try and help with sleep.   Goals of Care and Additional Recommendations:  Limitations on Scope of Treatment: Comfort care.   Code Status: DNR  Prognosis:   Could be days to weeks and at risk for bleeding with thrombocytopenia as well as clotting s/t known DVTs but unable to anticoagulate. Severe pain with movement and unable to leave hospital  at this time but will reassess daily.   Discharge Planning:  To Be Determined   Thank you for allowing the Palliative Medicine Team to assist in the care of this patient.   Time In: 0930 Time Out: 1100 Total Time 90 min Prolonged Time Billed  yes       Greater than 50%  of this time was spent counseling and coordinating care related to the above assessment and plan.  Vinie Sill, NP Palliative Medicine Team Pager # [phone] (M-F 8a-5p) Team Phone # [phone] (Nights/Weekends)  Please contact Palliative Medicine Team phone at [phone] for questions and concerns.

## 2016-01-07 NOTE — Progress Notes (Signed)
Palliative:  Mr. Yerton is now comfort care. I have discussed with Dr. Lonny Prude and Dr. Marin Olp. Will continue to address benefit vs risk of continuing blood transfusion, antibiotics, and labs but will continue these for now.   Vinie Sill, NP Palliative Medicine Team Pager # [phone] (M-F 8a-5p) Team Phone # [phone] (Nights/Weekends)

## 2016-01-07 NOTE — Progress Notes (Signed)
Nutrition Brief Note  Chart reviewed. Pt now transitioning to comfort care.  No further nutrition interventions warranted at this time.  Please re-consult as needed.    Stevenson Windmiller, RD, LDN, CNSC Pager [phone] After Hours Pager [phone]    

## 2016-01-07 NOTE — Progress Notes (Addendum)
PROGRESS NOTE    Marcus [name]  QHU:[id number] DOB: 04/21/1940 DOA: 12/25/2015 PCP: Jeanmarie Hubert, MD   Brief Narrative: Marcus [name] is a 75 y.o. 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   Assessment & Plan:   Principal Problem:   Acute confusional state Active Problems:   Essential hypertension   Cerebrovascular accident (CVA) due to stenosis of left carotid artery ([facility])   Atrial fibrillation with RVR (H[facility])   Urinary retention   Lower extremity weakness   T7 vertebral fracture ([facility])   Pressure injury of skin   MRSA infection   MSSA (methicillin susceptible Staphylococcus aureus) septicemia ([facility])   Paraplegia (H[facility])   Acute blood loss anemia   Melena   Gastrointestinal hemorrhage associated with duodenal ulcer   Myopathy   Other abnormalities of gait and mobility   Leukocytosis   Inadequate pain control   Compression fracture of thoracic spine, non-traumatic ([facility])   Palliative care encounter   Uncontrolled pain   Pancytopenia ([place])  Pancytopenia Patient not on heparin or lovenox. Stable. Not bleeding. Differential with normal ANC. ?iatrogenic secondary to vancomycin but has not improved since stopping. Cell lines continuing to decline. -hematology recommendations: bone marrow biopsy, however, patient is refusing. Started Neupogen -FOBT -hepatitis panel, HIV -retics, differential  Goals of care Made comfort care by palliative care today. -continue antibiotics for now -continue labs for now -continue Neupogen for now   Symptomatic anemia GI bleed Likely secondary to GI bleed secondary upper GI pathology. Suspect steroids have maintained a persistent ulcer which, with the addition of Eliquis, has been bleeding. S/p 6u PRBC since admission. EGD on 11/1 significant for nonbleeding ulcer. Last transfusion 11/7 -daily CBC -continue protonix -Gastroenterology  recommendations: signed off. Outpatient follow-up although now made comfort care. -wean decadron: down to 35m daily  ?Confusion Patient back to baseline from when I have seen him. Pain is a significant factor with regard to his mental status. Refused CT head scan previously. 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 Patient had not had vancomycin from 10/26 to 10/31. Unsure of what happened. ID recommendations: Vancomycin until 11/15, then Cefazolin 2g q8 hours from 11/16 to 12/27. Vancomycin switched to Daptomycin by Heme/Onc and then switched to Ceftaroline for MRSA pneumonia. -fentanyl patch -fentanyl IV prn per palliative care -Patient made comfort care, but will keep recommendations for now in case things change  Urinary retention -Urology recommendations: follow-up with urologist in 1-2 weeks for foley change (from 01/05/2016) if patient is discharged home and desires follow-up outpatient.  Atrial fibrillation with RVR Currently rate controlled. 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 Patient  afebrile. Resolved.   DVT prophylaxis: None. Acute DVT with pancytopenia anemia Code Status: DNR, Comfort care Family Communication:  Discussed with wife and daughter Disposition Plan: Patient made comfort care. Transfer to [facility].    Consultants:   GI  Cardiology  Interventional Radiology  Infectious Diseases  General Surgery  Hematology  Procedures:  EGD  Mesenteric angiogram  EGD (01/01/2016)  Antimicrobials:  Vancomycin (10/25; 11/1>>11/04)  Daptomycin (11/5>>11/6)  Diflucan (11/1>>11/10  Ceftaroline (11/6>>  Cefazolin (11/16>>12/27   Subjective: Patient states his pain is better controlled. He states that he is okay dying and just wants to be comfortable and not in pain. He does not want anymore diagnostic studies or procedures.  Objective: Vitals:   01/07/16 0039 01/07/16 0453 01/07/16 0518 01/07/16 0618  BP: 98/60 93/61 (!) 83/49 (!) 106/51  Pulse: 74  60 (!) 59  Resp: (!) 22  16 16   Temp: 97.8 F (36.6 C) 97.6 F (36.4 C) 97.5 F (36.4 C) 98.2 F (36.8 C)  TempSrc:  Oral Oral Oral  SpO2: 99%  98% 99%  Weight:      Height:        Intake/Output Summary (Last 24 hours) at 01/07/16 1103 Last data filed at 01/07/16 0800  Gross per 24 hour  Intake              784 ml  Output             1500 ml  Net             -716 ml   Filed Weights   01/04/16 0455 01/05/16 0400 01/06/16 0612  Weight: 102.3 kg (225 lb 9.6 oz) 96.6 kg (213 lb) 97.4 kg (214 lb 11.2 oz)    Examination:  General exam: Appears calm and comfortable  Respiratory system: Clear and diminished to auscultation. Respiratory effort normal. Cardiovascular system: S1 & S2 heard, RRR. No murmurs, rubs, gallops or clicks. Gastrointestinal system: Abdomen is nondistended, soft and mildly tender in LUQ. Normal bowel sounds heard. Central nervous system: Alert and oriented. CN intact. Minimal sensation in bilateral lower extremities. 0/5 LE strength bilaterally Extremities: No  calf tenderness. 2+ edema in bilateral legs Skin: No cyanosis. No rashes Psychiatry: Judgement and insight appear normal. Mood & affect depressed.    Data Reviewed: I have personally reviewed following labs and imaging studies  CBC:  Recent Labs Lab 01/04/16 1457 01/04/16 1757 01/05/16 0716 01/06/16 0425 01/07/16 0319  WBC 1.8* 1.9* 2.0* 1.8* 0.8*  NEUTROABS 1.6*  --   --   --   --   HGB 6.4* 7.0* 8.4* 7.7* 6.9*  HCT 19.6* 21.6* 25.3* 23.1* 20.5*  MCV 94.2 94.3 90.7 90.6 90.3  PLT 46* 47* 35* 33* 25*   Basic Metabolic Panel:  Recent Labs Lab 01/02/16 0450 01/04/16 0915 01/07/16 0319  NA 138 140 137  K 4.2 3.8 3.6  CL 109 111 109  CO2 25 24 22   GLUCOSE 111* 109* 116*  BUN 21* 22* 23*  CREATININE 0.51* 0.50* 0.53*  CALCIUM 7.1* 7.4* 7.0*   GFR: Estimated Creatinine Clearance: 92.8 mL/min (by C-G formula based on SCr of 0.53 mg/dL (L)). Liver Function Tests: No results for input(s): AST, ALT, ALKPHOS, BILITOT, PROT, ALBUMIN in the last 168 hours. No results for input(s): LIPASE, AMYLASE in the last 168 hours.  Recent Labs Lab 01/04/16 0915  AMMONIA 22   Coagulation Profile:  Recent Labs Lab 01/07/16 0319  INR 1.08   Cardiac Enzymes:  Recent Labs Lab 01/05/16 1845  CKTOTAL 84   BNP (last 3 results) No results for input(s): PROBNP in the  last 8760 hours. HbA1C: No results for input(s): HGBA1C in the last 72 hours. CBG:  Recent Labs Lab 01/05/16 2210 01/06/16 0734 01/06/16 1146 01/06/16 1640 01/07/16 0815  GLUCAP 161* 79 89 127* 141*   Lipid Profile: No results for input(s): CHOL, HDL, LDLCALC, TRIG, CHOLHDL, LDLDIRECT in the last 72 hours. Thyroid Function Tests: No results for input(s): TSH, T4TOTAL, FREET4, T3FREE, THYROIDAB in the last 72 hours. Anemia Panel:  Recent Labs  01/04/16 1507 01/04/16 1615  FERRITIN  --  1,279*  TIBC  --  155*  IRON  --  46  RETICCTPCT 3.3*  --    Sepsis Labs: No results for input(s):  PROCALCITON, LATICACIDVEN in the last 168 hours.  Recent Results (from the past 240 hour(s))  Culture, blood (routine x 2)     Status: None (Preliminary result)   Collection Time: 01/06/16  4:50 PM  Result Value Ref Range Status   Specimen Description BLOOD LEFT HAND  Final   Special Requests IN PEDIATRIC BOTTLE 3CC  Final   Culture PENDING  Incomplete   Report Status PENDING  Incomplete         Radiology Studies: US Abdomen Limited  Result Date: 01/06/2016 CLINICAL DATA:  Left upper quadrant pain EXAM: LIMITED ABDOMINAL ULTRASOUND COMPARISON:  CT 12/14/2015 FINDINGS: Limited ultrasound of left upper quadrant performed for evaluation of the spleen. The spleen measures 4.9 x 9.8 x 4.5 cm with a volume of 111.6 cubic cm. Incidentally noted is a large left-sided pleural effusion. IMPRESSION: 1. The spleen is within normal limits 2. Large left-sided pleural effusion Electronically Signed   By: Donavan Foil M.D.   On: 01/06/2016 16:47        Scheduled Meds: . calcitonin (salmon)  1 spray Alternating Nares Daily  . ceFTAROline (TEFLARO) IV  600 mg Intravenous Q12H  . dexamethasone  4 mg Intravenous Q24H  . diltiazem  180 mg Oral Daily  . feeding supplement (PRO-STAT SUGAR FREE 64)  30 mL Oral TID BM  . fentaNYL  75 mcg Transdermal Q72H  . filgrastim  480 mcg Subcutaneous Daily  . fluconazole  100 mg Oral Daily  . OLANZapine  5 mg Oral QHS  . pantoprazole  40 mg Oral BID  . senna-docusate  1 tablet Oral QHS  . sodium chloride flush  3 mL Intravenous Q12H  . timolol  1 drop Both Eyes BID   Continuous Infusions: . lactated ringers Stopped (12/29/15 0700)     LOS: 13 days     Cordelia Poche [facility] 01/07/2016, 11:03 AM Pager: [phone]  If 7PM-7AM, please contact night-coverage [email or web address] Password [email or web address] 01/07/2016, 11:03 AM

## 2016-01-08 ENCOUNTER — Inpatient Hospital Stay (HOSPITAL_COMMUNITY): Payer: Medicare Other

## 2016-01-08 DIAGNOSIS — D696 Thrombocytopenia, unspecified: Secondary | ICD-10-CM

## 2016-01-08 DIAGNOSIS — F05 Delirium due to known physiological condition: Secondary | ICD-10-CM

## 2016-01-08 DIAGNOSIS — M4854XS Collapsed vertebra, not elsewhere classified, thoracic region, sequela of fracture: Secondary | ICD-10-CM

## 2016-01-08 DIAGNOSIS — I63232 Cerebral infarction due to unspecified occlusion or stenosis of left carotid arteries: Secondary | ICD-10-CM

## 2016-01-08 DIAGNOSIS — Z66 Do not resuscitate: Secondary | ICD-10-CM

## 2016-01-08 LAB — HEPATITIS PANEL, ACUTE
HCV Ab: <0.1 s/co ratio (ref 0.0–0.9)
HEP B C IGM: NEGATIVE
Hep A IgM: NEGATIVE
Hepatitis B Surface Ag: NEGATIVE

## 2016-01-08 LAB — CBC
HCT: 21.8 % — ABNORMAL LOW (ref 39.0–52.0)
HCT: 19.2 % — ABNORMAL LOW (ref 39.0–52.0)
HEMOGLOBIN: 6.5 g/dL — AB (ref 13.0–17.0)
Hemoglobin: 7.4 g/dL — ABNORMAL LOW (ref 13.0–17.0)
MCH: 30.3 pg (ref 26.0–34.0)
MCH: 29.7 pg (ref 26.0–34.0)
MCHC: 33.9 g/dL (ref 30.0–36.0)
MCHC: 32.8 g/dL (ref 30.0–36.0)
MCV: 89.3 fL (ref 78.0–100.0)
MCV: 90.6 fL (ref 78.0–100.0)
PLATELETS: 24 10*3/uL — AB (ref 150–400)
Platelets: 22 10*3/uL — CL (ref 150–400)
RBC: 2.44 MIL/uL — AB (ref 4.22–5.81)
RBC: 2.12 MIL/uL — AB (ref 4.22–5.81)
RDW: 16.5 % — AB (ref 11.5–15.5)
RDW: 16.4 % — ABNORMAL HIGH (ref 11.5–15.5)
WBC: 1.1 10*3/uL — AB (ref 4.0–10.5)
WBC: 1.9 10*3/uL — AB (ref 4.0–10.5)

## 2016-01-08 LAB — TYPE AND SCREEN
ABO/RH(D): O NEG
Antibody Screen: NEGATIVE
UNIT DIVISION: 0
Unit division: 0

## 2016-01-08 LAB — HIV ANTIBODY (ROUTINE TESTING W REFLEX): HIV Screen 4th Generation wRfx: NONREACTIVE

## 2016-01-08 MED ORDER — SODIUM CHLORIDE 0.9 % IV SOLN
Freq: Once | INTRAVENOUS | Status: AC
  Administered 2016-01-09: 04:00:00 via INTRAVENOUS

## 2016-01-08 NOTE — Progress Notes (Signed)
[facility]                                                                              Patient Demographics  Dani Danis, is a 75 y.o. male, DOB - 1940/11/23, FVC:[id number]  Admit date - 12/25/2015   Admitting Physician Waldemar Dickens, MD  Outpatient Primary MD for the patient is Jeanmarie Hubert, MD  Outpatient specialists:   LOS - 14  days    No chief complaint on file.      Brief summary   JAKHAI FANT is a 75 y.o. 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   Assessment & Plan    Principal Problem: Pancytopenia Patient not on heparin or lovenox. Stable. Not bleeding. Differential with normal ANC. ?iatrogenic secondary to vancomycin but has not improved since stopping. Cell lines continuing to decline. -hematology recommendations: bone marrow biopsy, however, patient is refusing. Started Neupogen -FOBT -hepatitis panel, HIV -retics, differential  Goals of care Made comfort care by palliative care however continue antibiotics, labs, neupogen for now. Patient is still considering all his options given his pain is considerably improving today.   Symptomatic anemia GI bleed Likely secondary to GI bleed secondary upper GI pathology. Suspect steroids have maintained a persistent ulcer which, with the addition of Eliquis, has been bleeding. S/p 6u PRBC since admission. EGD on 11/1 significant for nonbleeding ulcer. 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 Patient had not had  vancomycin from 10/26 to 10/31. Unsure of what happened. ID recommendations: Vancomycin until 11/15, then Cefazolin 2g q8 hours from 11/16 to 12/27. Vancomycin switched to Daptomycin by Heme/Onc and then switched to Ceftaroline for MRSA pneumonia. -fentanyl patch -fentanyl IV prn per palliative care -Patient made comfort care by palliative medicine however given his pain has improved with new pain medication regimen, patient is considering all his options.  Urinary retention -Urology recommendations: follow-up with urologist in 1-2 weeks for foley change (from 01/05/2016) if patient is discharged home and desires follow-up outpatient.  Atrial fibrillation with RVR Currently rate controlled. 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 Patient afebrile. Resolved.  Code Status: dnr  DVT Prophylaxis:   SCD's Family Communication: Discussed in detail with the patient, all imaging results, lab results explained to the patient, wife, 2 daughters. Also separately discussed with patient's daughter CLoma Sousa  Disposition Plan: Unclear  Time Spent in minutes  25 minutes  consultants:   GI  Cardiology  Interventional Radiology  Infectious Diseases  General Surgery  Hematology  Procedures:  EGD  Mesenteric angiogram  EGD (01/01/2016)  Antimicrobials:   Vancomycin (10/25; 11/1>>11/04)  Daptomycin  (11/5>>11/6)  Diflucan (11/1>>11/10  Ceftaroline (11/6>>  Cefazolin (11/16>>12/27   Medications  Scheduled Meds: . calcitonin (salmon)  1 spray Alternating Nares Daily  . ceFTAROline (TEFLARO) IV  600 mg Intravenous Q12H  . dexamethasone  2 mg Intravenous Q24H  . diltiazem  180 mg Oral Daily  . feeding supplement (PRO-STAT SUGAR FREE 64)  30 mL Oral TID BM  . fentaNYL  75 mcg Transdermal Q72H  . LORazepam  0.5 mg Oral QHS   And  . fentaNYL (SUBLIMAZE) injection  100 mcg Intravenous QHS  . filgrastim  480 mcg Subcutaneous Daily  . fluconazole  100 mg Oral Daily  . OLANZapine  5 mg Oral QHS  . pantoprazole  40 mg Oral BID  . senna-docusate  1 tablet Oral QHS  . sodium chloride flush  3 mL Intravenous Q12H  . timolol  1 drop Both Eyes BID   Continuous Infusions: . lactated ringers Stopped (12/29/15 0700)   PRN Meds:.acetaminophen **OR** acetaminophen, alum & mag hydroxide-simeth, feeding supplement (ENSURE ENLIVE), fentaNYL (SUBLIMAZE) injection, ipratropium-albuterol, LORazepam, magic mouthwash, nicotine polacrilex, ondansetron (ZOFRAN) IV, promethazine **OR** promethazine **OR** promethazine, sodium chloride flush   Antibiotics   Anti-infectives    Start     Dose/Rate Route Frequency Ordered Stop   01/06/16 1500  ceftaroline (TEFLARO) 600 mg in sodium chloride 0.9 % 250 mL IVPB     600 mg 250 mL/hr over 60 Minutes Intravenous Every 12 hours 01/06/16 1349     01/05/16 1115  DAPTOmycin (CUBICIN) 579.5 mg in sodium chloride 0.9 % IVPB  Status:  Discontinued     6 mg/kg  96.6 kg 223.2 mL/hr over 30 Minutes Intravenous Every 24 hours 01/05/16 1038 01/06/16 1349   01/03/16 1000  fluconazole (DIFLUCAN) tablet 100 mg     100 mg Oral Daily 01/03/16 0911 01/10/16 2359   01/02/16 1000  fluconazole (DIFLUCAN) IVPB 100 mg  Status:  Discontinued     100 mg 50 mL/hr over 60 Minutes Intravenous Every 24 hours 01/02/16 0959 01/03/16 0911   01/01/16 2200  vancomycin (VANCOCIN) 1,250  mg in sodium chloride 0.9 % 250 mL IVPB  Status:  Discontinued     1,250 mg 166.7 mL/hr over 90 Minutes Intravenous Every 12 hours 01/01/16 2147 01/05/16 1051   01/01/16 1230  fluconazole (DIFLUCAN) tablet 100 mg  Status:  Discontinued     100 mg Oral Daily 01/01/16 1124 01/02/16 0959   12/25/15 1700  vancomycin (VANCOCIN) 1,250 mg in sodium chloride 0.9 % 250 mL IVPB  Status:  Discontinued     1,250 mg 166.7 mL/hr over 90 Minutes Intravenous Every 12 hours 12/25/15 1654 12/25/15 1931        Subjective:   Augustin Bun was seen and examined today.  Pain is better controlled, slept well last night, talking a lot today. He is still considering all his options and wants to be a good citizen. His family is at his bedside.   Objective:   Vitals:   01/07/16 0618 01/07/16 1506 01/07/16 2043 01/08/16 0542  BP: (!) 106/51 (!) 145/115 111/70 112/68  Pulse: (!) 59 72 85 72  Resp: 16 16 19 19   Temp: 98.2 F (36.8 C) 98.1 F (36.7  C) 98.1 F (36.7 C) 98.1 F (36.7 C)  TempSrc: Oral Oral Oral Oral  SpO2: 99% 98% 98% 98%  Weight:      Height:        Intake/Output Summary (Last 24 hours) at 01/08/16 1240 Last data filed at 01/08/16 0551  Gross per 24 hour  Intake             1070 ml  Output             1450 ml  Net             -380 ml     Wt Readings from Last 3 Encounters:  01/06/16 97.4 kg (214 lb 11.2 oz)  12/25/15 87 kg (191 lb 12.8 oz)  12/24/15 93.1 kg (205 lb 4.8 oz)     Exam  General: Alert and oriented x 3, NAD  HEENT:  PERRLA, EOMI, Anicteric Sclera, mucous membranes moist.   Neck: Supple, no JVD, no masses  Cardiovascular: S1 S2 auscultated, no rubs, murmurs or gallops. Regular rate and rhythm.  Respiratory: Clear to auscultation bilaterally, no wheezing, rales or rhonchi  Gastrointestinal: Soft, mild TTP in LUQ, nondistended, + bowel sounds  Ext: no cyanosis clubbing or 1+ edema  Neuro: AAOx3, Cr N's II- XII. Strength 5/5 upper and lower extremities  bilaterally  Skin: No rashes  Psych: appears anxious and depressed, alert and oriented x3    Data Reviewed:  I have personally reviewed following labs and imaging studies  Micro Results Recent Results (from the past 240 hour(s))  Culture, blood (routine x 2)     Status: None (Preliminary result)   Collection Time: 01/06/16  4:47 PM  Result Value Ref Range Status   Specimen Description BLOOD LEFT ANTECUBITAL  Final   Special Requests IN PEDIATRIC BOTTLE 3CC  Final   Culture NO GROWTH 2 DAYS  Final   Report Status PENDING  Incomplete  Culture, blood (routine x 2)     Status: None (Preliminary result)   Collection Time: 01/06/16  4:50 PM  Result Value Ref Range Status   Specimen Description BLOOD LEFT HAND  Final   Special Requests IN PEDIATRIC BOTTLE 3CC  Final   Culture NO GROWTH 2 DAYS  Final   Report Status PENDING  Incomplete    Radiology Reports Ct Abdomen Pelvis Wo Contrast  Result Date: 12/14/2015 CLINICAL DATA:  Abdominal distention and pain. Shortness of breath. Decreased appetite. EXAM: CT CHEST, ABDOMEN AND PELVIS WITHOUT CONTRAST TECHNIQUE: Multidetector CT imaging of the chest, abdomen and pelvis was performed following the standard protocol without IV contrast. COMPARISON:  CT abdomen pelvis 12/01/2015 and CT chest 07/07/2013. FINDINGS: CT CHEST FINDINGS Cardiovascular: Atherosclerotic calcification of the arterial vasculature, including three-vessel involvement of the coronary arteries. Heart size normal. There is pericardial calcification, indicative of prior pericarditis. No pericardial effusion. Mediastinum/Nodes: No pathologically enlarged mediastinal or axillary lymph nodes. Hilar regions are difficult to definitively evaluate without IV contrast. Esophagus is grossly unremarkable. Lungs/Pleura: Mild centrilobular emphysema. Rounded masslike consolidation in the medial aspect of the superior segment right lower lobe measures approximately 4.7 x 4.9 cm, is new from  07/07/2013, has internal ossific/calcific debris and obscures the extrapleural fat plane. Soft tissue extends around the adjacent vertebral body into the left extrapleural space. Trace right pleural effusion. Lungs are otherwise clear. Musculoskeletal: Lucency and destruction of the T7 vertebral body is seen with surrounding soft tissue. There is lysis within the anterior aspect of the T6 vertebral body as well. Airway  is unremarkable. CT ABDOMEN PELVIS FINDINGS Hepatobiliary: Liver and gallbladder are unremarkable. No biliary ductal dilatation. Pancreas: Negative. Spleen: Negative. Adrenals/Urinary Tract: Adrenal glands are unremarkable. Inferior aspects of both kidneys are obscured by streak artifact from spinal hardware. Low-attenuation lesions in the left kidney measure up to 2.0 cm and are difficult to further characterize without post-contrast imaging. Ureters are decompressed. There are scattered bladder diverticulum. Stomach/Bowel: Stomach, small bowel, appendix and colon are unremarkable. Vascular/Lymphatic: Atherosclerotic calcification of the arterial vasculature without abdominal aortic aneurysm. Postoperative changes are seen at the aortic bifurcation. No pathologically enlarged lymph nodes. Reproductive: Prostate is slightly enlarged and calcified. Other: No free fluid.  Mesenteries and peritoneum are unremarkable. Musculoskeletal: Postoperative changes in the lumbar spine. No additional worrisome lytic or sclerotic lesions. Bone graft harvest sites in the iliac wings. IMPRESSION: 1. Masslike consolidation in the right lower lobe with invasion of the extrapleural fat, worrisome for primary bronchogenic carcinoma. Lymphoma and sarcoma are considered less likely. 2. Lytic destruction and compression of the T7 vertebral body with surrounding soft tissue, consistent with a pathologic fracture. Minimal involvement of the T6 vertebral body. 3. Trace right pleural effusion. 4. No evidence of primary  malignancy or metastatic disease in the abdomen or pelvis. 5.  Aortic atherosclerosis (ICD10-170.0). Electronically Signed   By: Lorin Picket M.D.   On: 12/14/2015 14:21   Dg Chest 2 View  Result Date: 12/17/2015 CLINICAL DATA:  Shortness of breath, left-sided chest pain EXAM: CHEST  2 VIEW COMPARISON:  CT chest of 12/14/2015, chest x-ray of 12/14/2015, and two-view chest x-ray of 11/12/2015 FINDINGS: There is abnormal opacity overlying the right hilum and suprahilar region consistent with the soft tissue mass described on recent CT of the chest. In view of the rapid development since chest x-ray of 11/12/2015, an infectious or inflammatory process would be more likely than neoplasm. No pneumonia is seen and there is a small right pleural effusion present. The heart is within upper limits normal. Partial compression of T8 and the anterior inferior aspect of T7 is noted which could also either be infectious, inflammatory, or neoplastic in origin. IMPRESSION: 1. Abnormal opacity overlies the right hilum/suprahilar region consistent with the previously described soft tissue mass involving the posterior medial right lower lobe and thoracic vertebral column. Favor infectious or inflammatory process as noted above. 2. Small right pleural effusion. Electronically Signed   By: Ivar Drape M.D.   On: 12/17/2015 11:57   Ct Head Wo Contrast  Result Date: 12/14/2015 CLINICAL DATA:  Generalized weakness, headache and photosensitivity. Decreased appetite. Fall yesterday with loss of sensation in right leg, initial encounter. EXAM: CT HEAD WITHOUT CONTRAST TECHNIQUE: Contiguous axial images were obtained from the base of the skull through the vertex without intravenous contrast. COMPARISON:  None. FINDINGS: Brain: No evidence of an acute infarct, acute hemorrhage, mass lesion, mass effect or hydrocephalus. Small area of encephalomalacia in the left occipital lobe, stable. Atrophy. Periventricular low attenuation.  Vascular: No hyperdense vessel or unexpected calcification. Skull: Normal. Negative for fracture or focal lesion. Sinuses/Orbits: No acute finding. Other: None. IMPRESSION: 1. No acute intracranial abnormality. 2. Atrophy and chronic microvascular white matter ischemic changes. 3. Small area of encephalomalacia in the left occipital lobe. Electronically Signed   By: Lorin Picket M.D.   On: 12/14/2015 10:31   Ct Chest Wo Contrast  Result Date: 12/14/2015 CLINICAL DATA:  Abdominal distention and pain. Shortness of breath. Decreased appetite. EXAM: CT CHEST, ABDOMEN AND PELVIS WITHOUT CONTRAST TECHNIQUE: Multidetector CT imaging of  the chest, abdomen and pelvis was performed following the standard protocol without IV contrast. COMPARISON:  CT abdomen pelvis 12/01/2015 and CT chest 07/07/2013. FINDINGS: CT CHEST FINDINGS Cardiovascular: Atherosclerotic calcification of the arterial vasculature, including three-vessel involvement of the coronary arteries. Heart size normal. There is pericardial calcification, indicative of prior pericarditis. No pericardial effusion. Mediastinum/Nodes: No pathologically enlarged mediastinal or axillary lymph nodes. Hilar regions are difficult to definitively evaluate without IV contrast. Esophagus is grossly unremarkable. Lungs/Pleura: Mild centrilobular emphysema. Rounded masslike consolidation in the medial aspect of the superior segment right lower lobe measures approximately 4.7 x 4.9 cm, is new from 07/07/2013, has internal ossific/calcific debris and obscures the extrapleural fat plane. Soft tissue extends around the adjacent vertebral body into the left extrapleural space. Trace right pleural effusion. Lungs are otherwise clear. Musculoskeletal: Lucency and destruction of the T7 vertebral body is seen with surrounding soft tissue. There is lysis within the anterior aspect of the T6 vertebral body as well. Airway is unremarkable. CT ABDOMEN PELVIS FINDINGS Hepatobiliary:  Liver and gallbladder are unremarkable. No biliary ductal dilatation. Pancreas: Negative. Spleen: Negative. Adrenals/Urinary Tract: Adrenal glands are unremarkable. Inferior aspects of both kidneys are obscured by streak artifact from spinal hardware. Low-attenuation lesions in the left kidney measure up to 2.0 cm and are difficult to further characterize without post-contrast imaging. Ureters are decompressed. There are scattered bladder diverticulum. Stomach/Bowel: Stomach, small bowel, appendix and colon are unremarkable. Vascular/Lymphatic: Atherosclerotic calcification of the arterial vasculature without abdominal aortic aneurysm. Postoperative changes are seen at the aortic bifurcation. No pathologically enlarged lymph nodes. Reproductive: Prostate is slightly enlarged and calcified. Other: No free fluid.  Mesenteries and peritoneum are unremarkable. Musculoskeletal: Postoperative changes in the lumbar spine. No additional worrisome lytic or sclerotic lesions. Bone graft harvest sites in the iliac wings. IMPRESSION: 1. Masslike consolidation in the right lower lobe with invasion of the extrapleural fat, worrisome for primary bronchogenic carcinoma. Lymphoma and sarcoma are considered less likely. 2. Lytic destruction and compression of the T7 vertebral body with surrounding soft tissue, consistent with a pathologic fracture. Minimal involvement of the T6 vertebral body. 3. Trace right pleural effusion. 4. No evidence of primary malignancy or metastatic disease in the abdomen or pelvis. 5.  Aortic atherosclerosis (ICD10-170.0). Electronically Signed   By: Lorin Picket M.D.   On: 12/14/2015 14:21   Ct Guided Needle Placement  Result Date: 12/18/2015 INDICATION: 75 year old male with a history of right lower lobe/posterior mediastinal mass with destruction of the T7 vertebral body. He also has new onset paraplegia. CT-guided biopsy is warranted to obtain tissue diagnosis. EXAM: CT GUIDANCE NEEDLE  PLACEMENT MEDICATIONS: None. ANESTHESIA/SEDATION: Moderate (conscious) sedation was employed during this procedure. A total of Versed 2 mg and Fentanyl 100 mcg was administered intravenously. Moderate Sedation Time: 14 minutes. The patient's level of consciousness and vital signs were monitored continuously by radiology nursing throughout the procedure under my direct supervision. FLUOROSCOPY TIME:  Fluoroscopy Time: 0 minutes 0 seconds (0 mGy). COMPLICATIONS: None immediate. PROCEDURE: Informed written consent was obtained from the patient after a thorough discussion of the procedural risks, benefits and alternatives. All questions were addressed. Maximal Sterile Barrier Technique was utilized including caps, mask, sterile gowns, sterile gloves, sterile drape, hand hygiene and skin antiseptic. A timeout was performed prior to the initiation of the procedure. A planning axial CT scan was performed. The infiltrative mass along the medial aspect of the right lower lobe directly invading the posterior mediastinum was successfully identified. A suitable skin entry site was  selected and marked. The region was sterilely prepped and draped in the standard fashion using chlorhexidine skin prep. Local anesthesia was attained by infiltration with 1% lidocaine. A small dermatotomy was made. Under intermittent CT guidance, a 17 gauge introducer needle was advanced into the margin of the soft tissue mass. Multiple 18 gauge core biopsies were then coaxially obtained using the bio Pince automated biopsy device. Biopsy specimens were placed in saline and delivered to pathology for further analysis. Additional 20 gauge needle aspirate was performed and inject into a small amount of sterile saline for cultures. Post biopsy axial CT imaging demonstrates no evidence of pneumothorax, hemorrhage or other complicating feature. IMPRESSION: Technically successful CT-guided core biopsy of right lower lobe/ posterior mediastinal mass lesion.  Signed, Criselda Peaches, MD [facility] Radiology Electronically Signed   By: Jacqulynn Cadet M.D.   On: 12/18/2015 12:58   Mr Thoracic Spine Wo Contrast  Result Date: 12/26/2015 CLINICAL DATA:  Re- evaluation of known compression fracture, infection, abscess. EXAM: MRI THORACIC SPINE WITHOUT CONTRAST TECHNIQUE: Multiplanar, multisequence MR imaging of the thoracic spine was performed. No intravenous contrast was administered. COMPARISON:  The prior MRI from 12/16/2015. FINDINGS: Examination is somewhat limited as the patient was unable to tolerate the full length of the exam. No contrast was administered on this study. A Alignment: Alignment is stable with preservation of the normal thoracic kyphosis. No listhesis. Vertebrae: Again seen is abnormal T1 hypo intense signal intensity involving the T6 and T7 vertebral bodies with associated T2/STIR hyperintensity, compatible with edema. Overall, changes are relatively similar as compared to prior study. Again, changes are contiguous with the right posterior chest wall lesion. The chest wall lesion appears overall decreased in size with decreased inflammatory changes as compared to prior MRI. This lesion measures approximately 3.2 x 2.6 cm on today's study, previously 4.4 x 4.1 cm. While no contrast was administered on this exam, the previously noted dorsal enhancing collection extending from T5 through T8-9 also appears decreased in size from previous exam, most evident on sagittal T2 weighted sequence (series 5, image 9). This collection measures up to 5 mm in maximal thickness at the level of T7-8. Associated spinal canal stenosis with mass effect on the thecal sac is improved, although remains moderate to severe at the T7 level (series 10, image 27). Thecal sac measures 8 mm in AP diameter at this level. Patchy T2 signal abnormality within the cord at T7 and T8 again seen, worrisome for myelopathy.  Compression deformity involving the T7 vertebral body is relatively stable with associated approximately 50% height loss and 4 mm bony retropulsion. No other new finding within the thoracic spine. Degenerative disc bulge noted at T12-L1 without stenosis. Layering left pleural effusion noted. IMPRESSION: 1. Similar appearance of abnormal signal intensity involving the T6 and T7 vertebral bodies with associated T7 compression fracture, with direct extension into the right lower lobe pulmonary process. Overall, inflammatory changes within the right lower lobe are improved, with decreased size of right lower lobe lesion as compared to prior MRI from 12/16/2015. Given the interval improvement, changes suggest an improving infectious process. T7 compression fracture with 50% height loss and 4 mm bony retropulsion is stable. 2. 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 3. Overall improvement in previously seen spinal canal stenosis, although there remains persistent moderate to severe stenosis at the level of T7. 4. Persistent abnormal signal intensity within the thoracic spinal cord at  T7-8, consistent with compressive myelopathy. 5. Layering left pleural effusion. Electronically Signed   By: Jeannine Boga M.D.   On: 12/26/2015 23:38   Mr Thoracic Spine W Wo Contrast  Result Date: 12/16/2015 CLINICAL DATA:  Invasive thoracic mass. Bilateral lower extremity anesthesia. EXAM: MRI THORACIC SPINE WITHOUT AND WITH CONTRAST TECHNIQUE: Multiplanar and multiecho pulse sequences of the thoracic spine were obtained without and with intravenous contrast. CONTRAST:  11m MULTIHANCE GADOBENATE DIMEGLUMINE 529 MG/ML IV SOLN COMPARISON:  Chest CT 12/14/2015 FINDINGS: There is loss of the normal T1 weighted signal within the T6 and T7 vertebral bodies with associated contrast enhancement. This is in continuity with a posterior right chest wall mass  that is better characterized on the recent chest CT. On the current examination, this mass measures approximately 4.4 x 4.1 cm. There is a peripherally enhancing collection along the dorsal aspect of the spinal canal extending from the level of the T5-T6 disc to the T9 level, measuring up to 5 mm in thickness. This contributes to severe attenuation of the thecal sac and compression of the spinal cord. There is increased T2 weighted signal within the spinal cord at the T7 and T8 levels suggesting compressive myelopathy. Additionally, there is compression deformity of the T7 body with approximately 50% height loss centrally and mild retropulsion. The other thoracic levels are unremarkable without spinal canal stenosis. IMPRESSION: 1. Abnormal signal and contrast enhancement of the T6 and T7 vertebral bodies with associated T7 compression fracture. This is likely secondary to direct extension of the process within the right lower lobe. While this may be extension of a pulmonary malignancy invading the vertebral column, an infectious process of the lung with an associated discitis-osteomyelitis is also a possibility. Given this constellation of findings, atypical infectious processes (such as tuberculosis) should be considered. 2. 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 3. 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 Critical Value/emergent results were called by telephone at the time of interpretation on 12/16/2015 at 6:53 pm to Dr. ORaiford Noble, who verbally acknowledged these results. Electronically Signed   By: KUlyses JarredM.D.   On: 12/16/2015 18:54   Ir Angiogram Visceral Selective  Result Date: 12/27/2015 CLINICAL DATA:  GI bleed. Endoscopy suggests  proximal jejunal source. Known duodenal ulcer, but no active bleeding seen on recent endoscopy. EXAM: SELECTIVE VISCERAL ARTERIOGRAPHY; IR ULTRASOUND GUIDANCE VASC ACCESS RIGHT ANESTHESIA/SEDATION: Intravenous Fentanyl and Versed were administered as conscious sedation during continuous monitoring of the patient's level of consciousness and physiological / cardiorespiratory status by the radiology RN, with a total moderate sedation time of 32 minutes. MEDICATIONS: Lidocaine 1% subcutaneous PROCEDURE: The procedure, risks (including but not limited to bleeding, infection, organ damage ), benefits, and alternatives were explained to the patient. Questions regarding the procedure were encouraged and answered. The patient understands and consents to the procedure. Right femoral region prepped and draped in usual sterile fashion. Maximal barrier sterile technique was utilized including caps, mask, sterile gowns, sterile gloves, sterile drape, hand hygiene and skin antiseptic. The right common femoral artery was localized under ultrasound. Under real-time ultrasound guidance, the vessel was accessed with a 21-gauge micropuncture needle, exchanged over a 018 guidewire for a transitional dilator, through which a 035 guidewire was advanced. Over this, a 5 [place]  vascular sheath was placed, through which a 5 [place] C2 catheter was advanced and used to selectively catheterize the celiac axis for selective arteriography in multiple projections. The C2 was then utilized to selectively catheterize the superior mesenteric artery for selective arteriography in multiple projections. The C2 was then exchanged for a RIM catheter, utilized to selectively catheterize the inferior mesenteric artery for selective arteriography in 2 projections. The catheter and sheath were removed and hemostasis achieved with the aid of the Exoseal device after confirmatory femoral arteriography. The patient tolerated the procedure well. COMPLICATIONS:  None immediate FINDINGS: No evidence of active extravasation, early draining vein, AVM, or other lesion to suggest a site or etiology of the patient's GI bleeding. 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 Venous phase confirms patency of the IMV and portal venous system. IMPRESSION: 1. Negative three-vessel mesenteric arteriogram. No evidence of active extravasation or other focal lesion to suggest etiology of GI bleed. Electronically Signed   By: Lucrezia Europe M.D.   On: 12/27/2015 16:56   Ir Angiogram Visceral Selective  Result Date: 12/27/2015 CLINICAL DATA:  GI bleed. Endoscopy suggests proximal jejunal source. Known duodenal ulcer, but no active bleeding seen on recent endoscopy. EXAM: SELECTIVE VISCERAL ARTERIOGRAPHY; IR ULTRASOUND GUIDANCE VASC ACCESS RIGHT ANESTHESIA/SEDATION: Intravenous Fentanyl and Versed were administered as conscious sedation during continuous monitoring of the patient's level of consciousness and physiological / cardiorespiratory status by the radiology RN, with a total moderate sedation time of 32 minutes. MEDICATIONS: Lidocaine 1% subcutaneous PROCEDURE: The procedure, risks (including but not limited to bleeding, infection, organ damage ), benefits, and alternatives were explained to the patient. Questions regarding the procedure were encouraged and answered. The patient understands and consents to the procedure. Right femoral region prepped and draped in usual sterile fashion. Maximal barrier sterile technique was utilized including caps, mask, sterile gowns, sterile gloves, sterile drape, hand hygiene and skin antiseptic. The right common femoral artery was localized under ultrasound. Under real-time ultrasound guidance, the vessel was accessed with a 21-gauge micropuncture needle, exchanged over a 018 guidewire for a transitional dilator, through which a 035 guidewire was advanced. Over this, a 5 [place] vascular sheath was placed,  through which a 5 [place] C2 catheter was advanced and used to selectively catheterize the celiac axis for selective arteriography in multiple projections. The C2 was then utilized to selectively catheterize the superior mesenteric artery for selective arteriography in multiple projections. The C2 was then exchanged for a RIM catheter, utilized to selectively catheterize the inferior mesenteric artery for selective arteriography in 2 projections. The catheter and sheath were removed and hemostasis achieved with the aid of the Exoseal device after confirmatory femoral arteriography. The patient tolerated the procedure well. COMPLICATIONS: None immediate FINDINGS: No evidence of active extravasation, early draining vein, AVM, or other lesion to suggest a site or etiology of the patient's GI bleeding. 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 Venous phase confirms patency of the IMV and portal venous system. IMPRESSION: 1. Negative three-vessel mesenteric arteriogram. No evidence of active extravasation or other focal lesion to suggest etiology of GI bleed. Electronically Signed   By: Lucrezia Europe M.D.   On: 12/27/2015 16:56   Ir Angiogram Visceral Selective  Result Date: 12/27/2015 CLINICAL DATA:  GI bleed. Endoscopy suggests proximal jejunal source. Known duodenal ulcer, but no active bleeding seen on recent endoscopy. EXAM: SELECTIVE VISCERAL ARTERIOGRAPHY; IR ULTRASOUND GUIDANCE VASC ACCESS RIGHT ANESTHESIA/SEDATION: Intravenous Fentanyl and  Versed were administered as conscious sedation during continuous monitoring of the patient's level of consciousness and physiological / cardiorespiratory status by the radiology RN, with a total moderate sedation time of 32 minutes. MEDICATIONS: Lidocaine 1% subcutaneous PROCEDURE: The procedure, risks (including but not limited to bleeding, infection, organ damage ), benefits, and alternatives were explained to the patient. Questions  regarding the procedure were encouraged and answered. The patient understands and consents to the procedure. Right femoral region prepped and draped in usual sterile fashion. Maximal barrier sterile technique was utilized including caps, mask, sterile gowns, sterile gloves, sterile drape, hand hygiene and skin antiseptic. The right common femoral artery was localized under ultrasound. Under real-time ultrasound guidance, the vessel was accessed with a 21-gauge micropuncture needle, exchanged over a 018 guidewire for a transitional dilator, through which a 035 guidewire was advanced. Over this, a 5 [place] vascular sheath was placed, through which a 5 [place] C2 catheter was advanced and used to selectively catheterize the celiac axis for selective arteriography in multiple projections. The C2 was then utilized to selectively catheterize the superior mesenteric artery for selective arteriography in multiple projections. The C2 was then exchanged for a RIM catheter, utilized to selectively catheterize the inferior mesenteric artery for selective arteriography in 2 projections. The catheter and sheath were removed and hemostasis achieved with the aid of the Exoseal device after confirmatory femoral arteriography. The patient tolerated the procedure well. COMPLICATIONS: None immediate FINDINGS: No evidence of active extravasation, early draining vein, AVM, or other lesion to suggest a site or etiology of the patient's GI bleeding. 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 Venous phase confirms patency of the IMV and portal venous system. IMPRESSION: 1. Negative three-vessel mesenteric arteriogram. No evidence of active extravasation or other focal lesion to suggest etiology of GI bleed. Electronically Signed   By: Lucrezia Europe M.D.   On: 12/27/2015 16:56   US Abdomen Limited  Result Date: 01/06/2016 CLINICAL DATA:  Left upper quadrant pain EXAM: LIMITED ABDOMINAL ULTRASOUND  COMPARISON:  CT 12/14/2015 FINDINGS: Limited ultrasound of left upper quadrant performed for evaluation of the spleen. The spleen measures 4.9 x 9.8 x 4.5 cm with a volume of 111.6 cubic cm. Incidentally noted is a large left-sided pleural effusion. IMPRESSION: 1. The spleen is within normal limits 2. Large left-sided pleural effusion Electronically Signed   By: Donavan Foil M.D.   On: 01/06/2016 16:47   Ir US Guide Vasc Access Right  Result Date: 12/27/2015 CLINICAL DATA:  GI bleed. Endoscopy suggests proximal jejunal source. Known duodenal ulcer, but no active bleeding seen on recent endoscopy. EXAM: SELECTIVE VISCERAL ARTERIOGRAPHY; IR ULTRASOUND GUIDANCE VASC ACCESS RIGHT ANESTHESIA/SEDATION: Intravenous Fentanyl and Versed were administered as conscious sedation during continuous monitoring of the patient's level of consciousness and physiological / cardiorespiratory status by the radiology RN, with a total moderate sedation time of 32 minutes. MEDICATIONS: Lidocaine 1% subcutaneous PROCEDURE: The procedure, risks (including but not limited to bleeding, infection, organ damage ), benefits, and alternatives were explained to the patient. Questions regarding the procedure were encouraged and answered. The patient understands and consents to the procedure. Right femoral region prepped and draped in usual sterile fashion. Maximal barrier sterile technique was utilized including caps, mask, sterile gowns, sterile gloves, sterile drape, hand hygiene and skin antiseptic. The right common femoral artery was localized under ultrasound. Under real-time ultrasound guidance, the vessel was accessed with a 21-gauge micropuncture needle, exchanged over a 018 guidewire for a transitional  dilator, through which a 035 guidewire was advanced. Over this, a 5 [place] vascular sheath was placed, through which a 5 [place] C2 catheter was advanced and used to selectively catheterize the celiac axis for selective arteriography in  multiple projections. The C2 was then utilized to selectively catheterize the superior mesenteric artery for selective arteriography in multiple projections. The C2 was then exchanged for a RIM catheter, utilized to selectively catheterize the inferior mesenteric artery for selective arteriography in 2 projections. The catheter and sheath were removed and hemostasis achieved with the aid of the Exoseal device after confirmatory femoral arteriography. The patient tolerated the procedure well. COMPLICATIONS: None immediate FINDINGS: No evidence of active extravasation, early draining vein, AVM, or other lesion to suggest a site or etiology of the patient's GI bleeding. 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 Venous phase confirms patency of the IMV and portal venous system. IMPRESSION: 1. Negative three-vessel mesenteric arteriogram. No evidence of active extravasation or other focal lesion to suggest etiology of GI bleed. Electronically Signed   By: Lucrezia Europe M.D.   On: 12/27/2015 16:56   Dg Chest Port 1 View  Result Date: 01/08/2016 CLINICAL DATA:  Shortness of breath, back pain. EXAM: PORTABLE CHEST 1 VIEW COMPARISON:  Portable chest x-ray of December 27, 2015 FINDINGS: The right lung is well-expanded and clear. On the left the retrocardiac region is dense. There is pleural fluid layering posteriorly and laterally. The heart and pulmonary vascularity are normal. The mediastinum is normal in width. There is calcification in the wall of the aortic arch. The PICC line tip projects just above the cavoatrial junction. The bony thorax is unremarkable where visualized. IMPRESSION: Moderate-sized pleural effusion on the left layering posteriorly. Left lower lobe atelectasis or pneumonia. Mild cardiomegaly without pulmonary vascular congestion. Aortic atherosclerosis. Electronically Signed   By: David  Martinique M.D.   On: 01/08/2016 12:16   Dg Chest Port 1 View  Result Date:  12/27/2015 CLINICAL DATA:  Shortness of breath.  Post EGD for GI bleed. EXAM: PORTABLE CHEST 1 VIEW COMPARISON:  12/17/2015 and CT chest 12/14/2015 FINDINGS: Interval placement of right-sided PICC line with tip over the SVC. Lungs are adequately inflated with moderate interval improvement in the airspace process over the medial superior segment right lower lobe. No effusion. Cardiomediastinal silhouette is within normal. There is minimal calcified plaque over the aortic arch. Remainder of the exam is unchanged. IMPRESSION: Interval improvement in airspace process over the medial aspect superior segment right lower lobe likely resolving pneumonia. Right-sided PICC line with tip over the SVC. Aortic atherosclerosis. Electronically Signed   By: Marin Olp M.D.   On: 12/27/2015 14:02   Dg Chest Port 1 View  Result Date: 12/14/2015 CLINICAL DATA:  75 year old male with a history of chest pain EXAM: PORTABLE CHEST 1 VIEW COMPARISON:  11/28/2015 FINDINGS: Double density in the right hilum, new from the comparison. No pleural effusion or pneumothorax. No confluent airspace disease. No displaced fracture. IMPRESSION: Double density in the right hilum, new from the comparison plain film in September. Given the rapid development, this may reflect a developing infection in the medial lung, however, contrast-enhanced CT is recommended to rule out adenopathy or mass. Signed, Dulcy Fanny. Earleen Newport, DO [facility] Electronically Signed   By: Corrie Mckusick D.O.   On: 12/14/2015 09:28    Lab Data:  CBC:  Recent Labs Lab 01/04/16 1457 01/04/16 1757 01/05/16 1497 01/06/16 0425 01/07/16 0263  01/07/16 0930 01/08/16 1100  WBC 1.8* 1.9* 2.0* 1.8* 0.8*  --  1.1*  NEUTROABS 1.6*  --   --   --   --   --   --   HGB 6.4* 7.0* 8.4* 7.7* 6.9* 8.5* 7.4*  HCT 19.6* 21.6* 25.3* 23.1* 20.5* 24.9* 21.8*  MCV 94.2 94.3 90.7 90.6 90.3  --  89.3  PLT 46* 47* 35* 33* 25*   --  PENDING   Basic Metabolic Panel:  Recent Labs Lab 01/02/16 0450 01/04/16 0915 01/07/16 0319  NA 138 140 137  K 4.2 3.8 3.6  CL 109 111 109  CO2 25 24 22   GLUCOSE 111* 109* 116*  BUN 21* 22* 23*  CREATININE 0.51* 0.50* 0.53*  CALCIUM 7.1* 7.4* 7.0*   GFR: Estimated Creatinine Clearance: 92.8 mL/min (by C-G formula based on SCr of 0.53 mg/dL (L)). Liver Function Tests: No results for input(s): AST, ALT, ALKPHOS, BILITOT, PROT, ALBUMIN in the last 168 hours. No results for input(s): LIPASE, AMYLASE in the last 168 hours.  Recent Labs Lab 01/04/16 0915  AMMONIA 22   Coagulation Profile:  Recent Labs Lab 01/07/16 0319  INR 1.08   Cardiac Enzymes:  Recent Labs Lab 01/05/16 1845  CKTOTAL 84   BNP (last 3 results) No results for input(s): PROBNP in the last 8760 hours. HbA1C: No results for input(s): HGBA1C in the last 72 hours. CBG:  Recent Labs Lab 01/06/16 0734 01/06/16 1146 01/06/16 1640 01/07/16 0815 01/07/16 1122  GLUCAP 79 89 127* 141* 126*   Lipid Profile: No results for input(s): CHOL, HDL, LDLCALC, TRIG, CHOLHDL, LDLDIRECT in the last 72 hours. Thyroid Function Tests: No results for input(s): TSH, T4TOTAL, FREET4, T3FREE, THYROIDAB in the last 72 hours. Anemia Panel:  Recent Labs  01/07/16 1435  RETICCTPCT 0.5   Urine analysis:    Component Value Date/Time   COLORURINE YELLOW 12/26/2015 1515   APPEARANCEUR CLEAR 12/26/2015 1515   APPEARANCEUR Clear 12/20/2014 1041   LABSPEC 1.025 12/26/2015 1515   PHURINE 6.0 12/26/2015 1515   GLUCOSEU NEGATIVE 12/26/2015 1515   HGBUR SMALL (A) 12/26/2015 1515   BILIRUBINUR NEGATIVE 12/26/2015 1515   BILIRUBINUR Negative 12/20/2014 [place] 12/26/2015 1515   PROTEINUR NEGATIVE 12/26/2015 1515   UROBILINOGEN 0.2 06/06/2014 1332   NITRITE NEGATIVE 12/26/2015 1515   LEUKOCYTESUR NEGATIVE 12/26/2015 1515   LEUKOCYTESUR 3+ (A) 12/20/2014 1041     RAI,RIPUDEEP M.D. [facility] 01/08/2016, 12:40 PM  Pager: [phone] Between 7am to 7pm - call Pager - [phone]  After 7pm go to [email or web address] - password [email or web address]  Call night coverage person covering after 7pm

## 2016-01-08 NOTE — Progress Notes (Signed)
Daily Progress Note   Patient Name: Marcus [name]       Date: 01/08/2016 DOB: October 27, 1940  Age: 75 y.o. MRN#: [id number] Attending Physician: Mendel Corning, MD Primary Care Physician: Jeanmarie Hubert, MD Admit Date: 12/25/2015  Reason for Consultation/Follow-up: Establishing goals of care, Non pain symptom management, Pain control and Psychosocial/spiritual support  Subjective: Marcus [name] appears more relaxed and pain is much improved today. He is requesting follow up labs and PCXR now in hopes of "seeing where I am at." I have ordered CBC and PCXR and will discuss further with patient and family later so we can figure out how to move forward. He does continue to have slight moments of confusion but overall very understanding and clear about his wishes this far.   I did follow up this afternoon although Marcus [name] was in a deep sleep. Reviewed lab trends with family and updated on pleural effusion (recommend to watch clinically for respiratory changes). Family says that he has expressed more hope today to them and they wonder if he is more willing to pursue diagnostics and interventions. We discussed the main issues with IVC filter as well as bone marrow biopsy. At family's request I will discuss with IR if these two procedures can be done with conscious sedation at the same time. Will also discuss with Dr. Marin Olp if Marcus [name] is interested in pursuing now.   I will have to follow up tomorrow. For now we will continue antibiotics, blood transfusion, labs, supportive care until he wishes are more clear.   Length of Stay: 14  Current Medications: Scheduled Meds:  . calcitonin (salmon)  1 spray Alternating Nares Daily  . ceFTAROline (TEFLARO) IV  600 mg Intravenous Q12H  . dexamethasone  2 mg  Intravenous Q24H  . diltiazem  180 mg Oral Daily  . feeding supplement (PRO-STAT SUGAR FREE 64)  30 mL Oral TID BM  . fentaNYL  75 mcg Transdermal Q72H  . LORazepam  0.5 mg Oral QHS   And  . fentaNYL (SUBLIMAZE) injection  100 mcg Intravenous QHS  . filgrastim  480 mcg Subcutaneous Daily  . fluconazole  100 mg Oral Daily  . OLANZapine  5 mg Oral QHS  . pantoprazole  40 mg Oral BID  . senna-docusate  1 tablet Oral QHS  . sodium  chloride flush  3 mL Intravenous Q12H  . timolol  1 drop Both Eyes BID    Continuous Infusions: . lactated ringers Stopped (12/29/15 0700)    PRN Meds: acetaminophen **OR** acetaminophen, alum & mag hydroxide-simeth, feeding supplement (ENSURE ENLIVE), fentaNYL (SUBLIMAZE) injection, ipratropium-albuterol, LORazepam, magic mouthwash, nicotine polacrilex, ondansetron (ZOFRAN) IV, promethazine **OR** promethazine **OR** promethazine, sodium chloride flush  Physical Exam  Constitutional: He is oriented to person, place, and time. He appears well-developed and well-nourished.  HENT:  Head: Normocephalic and atraumatic.  Cardiovascular: Normal rate and regular rhythm.   Pulmonary/Chest: Effort normal. No accessory muscle usage. No tachypnea. No respiratory distress.  Abdominal: Soft. Normal appearance.  Genitourinary:  Genitourinary Comments: Foley catheter  Neurological: He is alert and oriented to person, place, and time.  Skin: Skin is warm and dry.  Psychiatric: His mood appears not anxious. He is not agitated. He is attentive.  Nursing note and vitals reviewed.           Vital Signs: BP (!) 100/52 (BP Location: Right Arm)   Pulse 67   Temp 98.3 F (36.8 C) (Oral)   Resp 19   Ht 5' 7"  (1.702 m)   Wt 97.4 kg (214 lb 11.2 oz)   SpO2 98%   BMI 33.63 kg/m  SpO2: SpO2: 98 % O2 Device: O2 Device: Not Delivered O2 Flow Rate: O2 Flow Rate (L/min): 1 L/min  Intake/output summary:   Intake/Output Summary (Last 24 hours) at 01/08/16 1327 Last data  filed at 01/08/16 0551  Gross per 24 hour  Intake             1070 ml  Output             1450 ml  Net             -380 ml   LBM: Last BM Date: 01/07/16 Baseline Weight: Weight: 94.1 kg (207 lb 8 oz) Most recent weight: Weight: 97.4 kg (214 lb 11.2 oz)       Palliative Assessment/Data: PPS: 20%   Flowsheet Rows   Flowsheet Row Most Recent Value  Intake Tab  Referral Department  Hospitalist  Unit at Time of Referral  Med/Surg Unit  Palliative Care Primary Diagnosis  Sepsis/Infectious Disease  Date Notified  01/03/16  Palliative Care Type  New Palliative care  Reason for referral  Pain, Clarify Goals of Care, Psychosocial or Spiritual support, Non-pain Symptom  Date of Admission  12/14/15  Date first seen by Palliative Care  01/04/16  # of days Palliative referral response time  1 Day(s)  # of days IP prior to Palliative referral  20  Clinical Assessment  Palliative Performance Scale Score  30%  Pain Max last 24 hours  Not able to report  Pain Min Last 24 hours  Not able to report  Dyspnea Max Last 24 Hours  Not able to report  Dyspnea Min Last 24 hours  Not able to report  Nausea Max Last 24 Hours  Not able to report  Nausea Min Last 24 Hours  Not able to report  Anxiety Max Last 24 Hours  Not able to report  Other Max Last 24 Hours  Not able to report  Psychosocial & Spiritual Assessment  Palliative Care Outcomes  Palliative Care follow-up planned  Yes, Facility      Patient Active Problem List   Diagnosis Date Noted  . DNR (do not resuscitate)   . Pancytopenia ([place]) 01/06/2016  . Uncontrolled pain   . Compression  fracture of thoracic spine, non-traumatic ([place])   . Palliative care encounter   . Inadequate pain control 01/02/2016  . Acute confusional state 01/02/2016  . Leukocytosis   . Myopathy   . Other abnormalities of gait and mobility   . Left leg DVT ([place]) 12/25/2015  . Acute blood loss anemia 12/25/2015  . Gastrointestinal hemorrhage associated with  duodenal ulcer 12/25/2015  . Melena   . 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 Neurogenic bowel   . Neurogenic bladder   . Hyperlipidemia   . 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 MRSA infection 12/23/2015  . MSSA (methicillin susceptible Staphylococcus aureus) septicemia ([place]) 12/23/2015  . Pressure injury of skin 12/19/2015  . Closed fracture of seventh thoracic vertebra ([place])   . T7 vertebral fracture ([place])   . Decreased sensation   . Atrial fibrillation with RVR ([place]) 12/14/2015  . Fall 12/14/2015  . Community acquired pneumonia of left lung 12/14/2015  . Compression fracture of vertebra ([place]) 12/14/2015  . Urinary retention 12/14/2015  . PAF (paroxysmal atrial fibrillation) ([place]) 12/14/2015  . Lung mass 12/14/2015  . Lower extremity weakness 12/14/2015  . SOB (shortness of breath) 11/12/2015  . Chest pain 11/12/2015  . S/P carotid endarterectomy 04/10/2015  . 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 Glaucoma 03/20/2015  . Diabetes ([place]) 10/10/2014  . HLD (hyperlipidemia) 10/10/2014  . 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 Essential hypertension   . Carotid stenosis   . TIA (transient ischemic attack) 06/01/2014  . Malaise and fatigue 11/08/2013  . Lumbago   . Benign essential tremor   . Vitamin D deficiency     Palliative Care Assessment & Plan   Patient Profile: 75 y.o. male  with past medical history of Atrial fibrillation, hypertension, diabetes, CVA, GI bleed admitted on 12/25/2015 with generalized weakness, failure to thrive, after a recent fall. He was initially admitted to the hospital on 12/14/2015 then was transferred to inpatient rehabilitation, and now is readmitted to the hospitalist service on 12/25/2015. His initial chest x-ray on 12/14/2015 revealed community-acquired pneumonia as well as findings of a right lower lobe mass, closed fracture  of T7 vertebrae. Patient's biopsy of right lower lobe mass revealed inflammatory changes. 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 He is being treated with antibiotics. Despite supportive therapy, antibiotics extensive workup his mood has remained very labile, ongoing reports of pain, as well as inability to move his lower extremities.. He has been seen by psychiatry and felt to have capacity.  Additionally he has thrombocytopenia, platelets 62, and an albumin level of 1.5 On 11 07/02/2015 he has developed pancytopenia, white blood cell count 1.9; hemoglobin 7.0 after being transfused 1 unit (was 6.4 on 01/04/2016), platelets 47. Oncology consult placed to Dr. Burney Gauze who did see patient on 01/05/2016. Labs continue to worsen with unknown etiology.    Recommendations/Plan:  Comfort focused care although he is questioning full comfort care at this time.   Pain: Fentanyl transdermal patch at 75 g every 72 hours and fentanyl IV increased to 50-100 g every 1 hour as needed for breakthrough pain.    Acute confusional state/delirium: Much improved.   Anxiety: Continue Ativan 0.5 mg TID as needed.  Insomnia: Says trazodone does not help at all. Says that Ativan helps and pain is main reason for keeping him awake.  Plan to receive dose of Ativan and fentanyl tonight at bedtime to try and help with sleep.   Goals of Care and Additional Recommendations:  Limitations on Scope of Treatment: Comfort care.   Code Status: DNR  Prognosis:   Could be days to weeks and at risk for bleeding with thrombocytopenia as well as clotting s/t known DVTs but unable to anticoagulate.   Discharge Planning:  To Be Determined   Thank you for allowing the Palliative Medicine Team to assist in the care of this patient.   Time In/Out: 1015-1045, 1330-1400 Total Time 16mn Prolonged Time Billed  no       Greater than 50%  of this time was  spent counseling and coordinating care related to the above assessment and plan.  AVinie Sill NP Palliative Medicine Team Pager # [phone](M-F 8a-5p) Team Phone # [phone](Nights/Weekends)  Please contact Palliative Medicine Team phone at [phone] questions and concerns.

## 2016-01-09 ENCOUNTER — Inpatient Hospital Stay (HOSPITAL_COMMUNITY): Payer: Medicare Other

## 2016-01-09 DIAGNOSIS — L0291 Cutaneous abscess, unspecified: Secondary | ICD-10-CM

## 2016-01-09 DIAGNOSIS — J90 Pleural effusion, not elsewhere classified: Secondary | ICD-10-CM

## 2016-01-09 DIAGNOSIS — Z515 Encounter for palliative care: Secondary | ICD-10-CM

## 2016-01-09 LAB — COMPREHENSIVE METABOLIC PANEL
ALBUMIN: 1.3 g/dL — AB (ref 3.5–5.0)
ALK PHOS: 116 U/L (ref 38–126)
ALT: 113 U/L — AB (ref 17–63)
AST: 47 U/L — AB (ref 15–41)
Anion gap: 3 — ABNORMAL LOW (ref 5–15)
BILIRUBIN TOTAL: 0.5 mg/dL (ref 0.3–1.2)
BUN: 25 mg/dL — AB (ref 6–20)
CALCIUM: 6.8 mg/dL — AB (ref 8.9–10.3)
CO2: 24 mmol/L (ref 22–32)
CREATININE: 0.62 mg/dL (ref 0.61–1.24)
Chloride: 111 mmol/L (ref 101–111)
GFR calc Af Amer: >60 mL/min (ref >60)
GLUCOSE: 92 mg/dL (ref 65–99)
Potassium: 3.7 mmol/L (ref 3.5–5.1)
Sodium: 138 mmol/L (ref 135–145)
TOTAL PROTEIN: 3.4 g/dL — AB (ref 6.5–8.1)

## 2016-01-09 LAB — CBC
HCT: 22.3 % — ABNORMAL LOW (ref 39.0–52.0)
HEMOGLOBIN: 7.4 g/dL — AB (ref 13.0–17.0)
MCH: 29.7 pg (ref 26.0–34.0)
MCHC: 33.2 g/dL (ref 30.0–36.0)
MCV: 89.6 fL (ref 78.0–100.0)
PLATELETS: 20 10*3/uL — AB (ref 150–400)
RBC: 2.49 MIL/uL — AB (ref 4.22–5.81)
RDW: 15.4 % (ref 11.5–15.5)
WBC: 2.3 10*3/uL — AB (ref 4.0–10.5)

## 2016-01-09 LAB — GRAM STAIN

## 2016-01-09 LAB — PREPARE RBC (CROSSMATCH)

## 2016-01-09 LAB — PROTEIN, BODY FLUID

## 2016-01-09 LAB — PREALBUMIN: PREALBUMIN: 12.7 mg/dL — AB (ref 18–38)

## 2016-01-09 LAB — LACTATE DEHYDROGENASE, PLEURAL OR PERITONEAL FLUID: LD, Fluid: 52 U/L — ABNORMAL HIGH (ref 3–23)

## 2016-01-09 MED ORDER — FENTANYL CITRATE (PF) 100 MCG/2ML IJ SOLN
100.0000 ug | INTRAMUSCULAR | Status: DC | PRN
  Administered 2016-01-09 – 2016-01-10 (×10): 100 ug via INTRAVENOUS
  Filled 2016-01-09 (×9): qty 2

## 2016-01-09 MED ORDER — LIDOCAINE HCL (PF) 1 % IJ SOLN
INTRAMUSCULAR | Status: AC
  Filled 2016-01-09: qty 10

## 2016-01-09 NOTE — Progress Notes (Signed)
[facility]                                                                              Patient Demographics  Marcus [name], is a 75 y.o. male, DOB - [birth year], [id number]  Admit date - 12/25/2015   Admitting Physician Waldemar Dickens, MD  Outpatient Primary MD for the patient is Jeanmarie Hubert, MD  Outpatient specialists:   LOS - 15  days    No chief complaint on file.      Brief summary   Marcus [name] is a 76 y.o. 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   Assessment & Plan    Principal Problem: Pancytopenia Reviewed Dr. Antonieta Pert note and recommendations, at this point very little bone marrow functioning with respect to erythropoiesis Discussed jointly with palliative medicine today and family. Discontinued labs, antibiotics and no further blood transfusions at this point. Patient transitioned to full comfort care with wife's request. Discussed about residential hospice, patient's wife open to the idea of [employer]. Social work consult placed for residential hospice.  Pleural effusion large left  - Ultrasound-guided Thoracentesis for comfort care today    Symptomatic anemia GI bleed Likely secondary to GI bleed secondary upper GI pathology. Suspect steroids have maintained a persistent ulcer which, with the addition of Eliquis, has been bleeding. S/p 6u PRBC since admission. EGD on 11/1 significant for nonbleeding ulcer. Last transfusion 11/7 - At this point discontinued labs and no further blood transfusions, transitioned to full comfort care  Esophageal candidiasis Seen on EGD. 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  -Patient made comfort care by  palliative medicine, now focus on pain control and pain management regimen.  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 Patient afebrile. Resolved.  Code Status: dnr  DVT Prophylaxis:   SCD's Family Communication: Discussed in detail with the patient's wife and daughter Loma Sousa.   Disposition Plan: Residential hospice when bed available  Time Spent in minutes  25 minutes  consultants:   GI  Cardiology  Interventional Radiology  Infectious Diseases  General Surgery  Hematology  Procedures:  EGD  Mesenteric angiogram  EGD (01/01/2016)  Antimicrobials:   Vancomycin (10/25; 11/1>>11/04)  Daptomycin (11/5>>11/6)  Diflucan (11/1>>11/10  Ceftaroline (11/6>>  Cefazolin (11/16>>12/27   Medications  Scheduled Meds: . dexamethasone  2 mg Intravenous Q24H  . diltiazem  180 mg Oral Daily  . feeding supplement (PRO-STAT SUGAR FREE 64)  30 mL Oral TID BM  . fentaNYL  75 mcg Transdermal Q72H  . LORazepam  0.5 mg  Oral QHS   And  . fentaNYL (SUBLIMAZE) injection  100 mcg Intravenous QHS  . fluconazole  100 mg Oral Daily  . lidocaine (PF)      . OLANZapine  5 mg Oral QHS  . pantoprazole  40 mg Oral BID  . senna-docusate  1 tablet Oral QHS  . sodium chloride flush  3 mL Intravenous Q12H  . timolol  1 drop Both Eyes BID   Continuous Infusions: . lactated ringers Stopped (12/29/15 0700)   PRN Meds:.acetaminophen **OR** acetaminophen, alum & mag hydroxide-simeth, feeding supplement (ENSURE ENLIVE), fentaNYL  (SUBLIMAZE) injection, ipratropium-albuterol, LORazepam, magic mouthwash, nicotine polacrilex, ondansetron (ZOFRAN) IV, promethazine **OR** promethazine **OR** promethazine, sodium chloride flush   Antibiotics   Anti-infectives    Start     Dose/Rate Route Frequency Ordered Stop   01/06/16 1500  ceftaroline (TEFLARO) 600 mg in sodium chloride 0.9 % 250 mL IVPB  Status:  Discontinued     600 mg 250 mL/hr over 60 Minutes Intravenous Every 12 hours 01/06/16 1349 01/09/16 1035   01/05/16 1115  DAPTOmycin (CUBICIN) 579.5 mg in sodium chloride 0.9 % IVPB  Status:  Discontinued     6 mg/kg  96.6 kg 223.2 mL/hr over 30 Minutes Intravenous Every 24 hours 01/05/16 1038 01/06/16 1349   01/03/16 1000  fluconazole (DIFLUCAN) tablet 100 mg     100 mg Oral Daily 01/03/16 0911 01/10/16 2359   01/02/16 1000  fluconazole (DIFLUCAN) IVPB 100 mg  Status:  Discontinued     100 mg 50 mL/hr over 60 Minutes Intravenous Every 24 hours 01/02/16 0959 01/03/16 0911   01/01/16 2200  vancomycin (VANCOCIN) 1,250 mg in sodium chloride 0.9 % 250 mL IVPB  Status:  Discontinued     1,250 mg 166.7 mL/hr over 90 Minutes Intravenous Every 12 hours 01/01/16 2147 01/05/16 1051   01/01/16 1230  fluconazole (DIFLUCAN) tablet 100 mg  Status:  Discontinued     100 mg Oral Daily 01/01/16 1124 01/02/16 0959   12/25/15 1700  vancomycin (VANCOCIN) 1,250 mg in sodium chloride 0.9 % 250 mL IVPB  Status:  Discontinued     1,250 mg 166.7 mL/hr over 90 Minutes Intravenous Every 12 hours 12/25/15 1654 12/25/15 1931        Subjective:   Marcus [name] was seen and examined today.  Complaining of pain on the left lower lung otherwise in good spirits. Family at the bedside.    Objective:   Vitals:   01/09/16 0600 01/09/16 0719 01/09/16 1155 01/09/16 1220  BP: (!) 88/48 (!) 99/51 101/68 (!) 79/53  Pulse:  63    Resp:  18    Temp:  98.1 F (36.7 C)    TempSrc:  Oral    SpO2:  96%    Weight:      Height:         Intake/Output Summary (Last 24 hours) at 01/09/16 1253 Last data filed at 01/09/16 0719  Gross per 24 hour  Intake             1208 ml  Output              900 ml  Net              308 ml     Wt Readings from Last 3 Encounters:  01/06/16 97.4 kg (214 lb 11.2 oz)  12/25/15 87 kg (191 lb 12.8 oz)  12/24/15 93.1 kg (205 lb 4.8 oz)     Exam  General: Alert  and oriented x 3, NAD  HEENT:    Neck: Supple, no JVD  Cardiovascular: S1 S2 auscultated, no rubs, murmurs or gallops. Regular rate and rhythm.  Respiratory:Decreased breath sounds at the bases   Gastrointestinal: Soft, mild TTP in LUQ, nondistended, + bowel sounds  Ext: no cyanosis clubbing, 2+ edema  Neuro:no new deficits   Skin: No rashes  Psych: appears anxious and depressed, alert and oriented x3    Data Reviewed:  I have personally reviewed following labs and imaging studies  Micro Results Recent Results (from the past 240 hour(s))  Culture, blood (routine x 2)     Status: None (Preliminary result)   Collection Time: 01/06/16  4:47 PM  Result Value Ref Range Status   Specimen Description BLOOD LEFT ANTECUBITAL  Final   Special Requests IN PEDIATRIC BOTTLE 3CC  Final   Culture NO GROWTH 2 DAYS  Final   Report Status PENDING  Incomplete  Culture, blood (routine x 2)     Status: None (Preliminary result)   Collection Time: 01/06/16  4:50 PM  Result Value Ref Range Status   Specimen Description BLOOD LEFT HAND  Final   Special Requests IN PEDIATRIC BOTTLE 3CC  Final   Culture NO GROWTH 2 DAYS  Final   Report Status PENDING  Incomplete    Radiology Reports Ct Abdomen Pelvis Wo Contrast  Result Date: 12/14/2015 CLINICAL DATA:  Abdominal distention and pain. Shortness of breath. Decreased appetite. EXAM: CT CHEST, ABDOMEN AND PELVIS WITHOUT CONTRAST TECHNIQUE: Multidetector CT imaging of the chest, abdomen and pelvis was performed following the standard protocol without IV contrast. COMPARISON:  CT  abdomen pelvis 12/01/2015 and CT chest 07/07/2013. FINDINGS: CT CHEST FINDINGS Cardiovascular: Atherosclerotic calcification of the arterial vasculature, including three-vessel involvement of the coronary arteries. Heart size normal. There is pericardial calcification, indicative of prior pericarditis. No pericardial effusion. Mediastinum/Nodes: No pathologically enlarged mediastinal or axillary lymph nodes. Hilar regions are difficult to definitively evaluate without IV contrast. Esophagus is grossly unremarkable. Lungs/Pleura: Mild centrilobular emphysema. Rounded masslike consolidation in the medial aspect of the superior segment right lower lobe measures approximately 4.7 x 4.9 cm, is new from 07/07/2013, has internal ossific/calcific debris and obscures the extrapleural fat plane. Soft tissue extends around the adjacent vertebral body into the left extrapleural space. Trace right pleural effusion. Lungs are otherwise clear. Musculoskeletal: Lucency and destruction of the T7 vertebral body is seen with surrounding soft tissue. There is lysis within the anterior aspect of the T6 vertebral body as well. Airway is unremarkable. CT ABDOMEN PELVIS FINDINGS Hepatobiliary: Liver and gallbladder are unremarkable. No biliary ductal dilatation. Pancreas: Negative. Spleen: Negative. Adrenals/Urinary Tract: Adrenal glands are unremarkable. Inferior aspects of both kidneys are obscured by streak artifact from spinal hardware. Low-attenuation lesions in the left kidney measure up to 2.0 cm and are difficult to further characterize without post-contrast imaging. Ureters are decompressed. There are scattered bladder diverticulum. Stomach/Bowel: Stomach, small bowel, appendix and colon are unremarkable. Vascular/Lymphatic: Atherosclerotic calcification of the arterial vasculature without abdominal aortic aneurysm. Postoperative changes are seen at the aortic bifurcation. No pathologically enlarged lymph nodes. Reproductive:  Prostate is slightly enlarged and calcified. Other: No free fluid.  Mesenteries and peritoneum are unremarkable. Musculoskeletal: Postoperative changes in the lumbar spine. No additional worrisome lytic or sclerotic lesions. Bone graft harvest sites in the iliac wings. IMPRESSION: 1. Masslike consolidation in the right lower lobe with invasion of the extrapleural fat, worrisome for primary bronchogenic carcinoma. Lymphoma and sarcoma are considered less likely. 2.  Lytic destruction and compression of the T7 vertebral body with surrounding soft tissue, consistent with a pathologic fracture. Minimal involvement of the T6 vertebral body. 3. Trace right pleural effusion. 4. No evidence of primary malignancy or metastatic disease in the abdomen or pelvis. 5.  Aortic atherosclerosis (ICD10-170.0). Electronically Signed   By: Lorin Picket M.D.   On: 12/14/2015 14:21   Dg Chest 1 View  Result Date: 01/09/2016 CLINICAL DATA:  Status post thoracentesis EXAM: CHEST 1 VIEW COMPARISON:  Chest x-ray of January 08, 2016 FINDINGS: The patient has undergone left-sided thoracentesis. The volume of pleural fluid on the left has decreased. There is left basilar atelectasis or infiltrate. There is no pneumothorax. The right lung is clear. The heart is mildly enlarged but stable. The pulmonary vascularity is not engorged. The PICC line tip projects over the midportion of the SVC. IMPRESSION: No postprocedure complication following left-sided thoracentesis. Persistent left basilar atelectasis or pneumonia. Electronically Signed   By: David  Martinique M.D.   On: 01/09/2016 12:38   Dg Chest 2 View  Result Date: 12/17/2015 CLINICAL DATA:  Shortness of breath, left-sided chest pain EXAM: CHEST  2 VIEW COMPARISON:  CT chest of 12/14/2015, chest x-ray of 12/14/2015, and two-view chest x-ray of 11/12/2015 FINDINGS: There is abnormal opacity overlying the right hilum and suprahilar region consistent with the soft tissue mass described on  recent CT of the chest. In view of the rapid development since chest x-ray of 11/12/2015, an infectious or inflammatory process would be more likely than neoplasm. No pneumonia is seen and there is a small right pleural effusion present. The heart is within upper limits normal. Partial compression of T8 and the anterior inferior aspect of T7 is noted which could also either be infectious, inflammatory, or neoplastic in origin. IMPRESSION: 1. Abnormal opacity overlies the right hilum/suprahilar region consistent with the previously described soft tissue mass involving the posterior medial right lower lobe and thoracic vertebral column. Favor infectious or inflammatory process as noted above. 2. Small right pleural effusion. Electronically Signed   By: Ivar Drape M.D.   On: 12/17/2015 11:57   Ct Head Wo Contrast  Result Date: 12/14/2015 CLINICAL DATA:  Generalized weakness, headache and photosensitivity. Decreased appetite. Fall yesterday with loss of sensation in right leg, initial encounter. EXAM: CT HEAD WITHOUT CONTRAST TECHNIQUE: Contiguous axial images were obtained from the base of the skull through the vertex without intravenous contrast. COMPARISON:  None. FINDINGS: Brain: No evidence of an acute infarct, acute hemorrhage, mass lesion, mass effect or hydrocephalus. Small area of encephalomalacia in the left occipital lobe, stable. Atrophy. Periventricular low attenuation. Vascular: No hyperdense vessel or unexpected calcification. Skull: Normal. Negative for fracture or focal lesion. Sinuses/Orbits: No acute finding. Other: None. IMPRESSION: 1. No acute intracranial abnormality. 2. Atrophy and chronic microvascular white matter ischemic changes. 3. Small area of encephalomalacia in the left occipital lobe. Electronically Signed   By: Lorin Picket M.D.   On: 12/14/2015 10:31   Ct Chest Wo Contrast  Result Date: 12/14/2015 CLINICAL DATA:  Abdominal distention and pain. Shortness of breath.  Decreased appetite. EXAM: CT CHEST, ABDOMEN AND PELVIS WITHOUT CONTRAST TECHNIQUE: Multidetector CT imaging of the chest, abdomen and pelvis was performed following the standard protocol without IV contrast. COMPARISON:  CT abdomen pelvis 12/01/2015 and CT chest 07/07/2013. FINDINGS: CT CHEST FINDINGS Cardiovascular: Atherosclerotic calcification of the arterial vasculature, including three-vessel involvement of the coronary arteries. Heart size normal. There is pericardial calcification, indicative of prior pericarditis. No pericardial  effusion. Mediastinum/Nodes: No pathologically enlarged mediastinal or axillary lymph nodes. Hilar regions are difficult to definitively evaluate without IV contrast. Esophagus is grossly unremarkable. Lungs/Pleura: Mild centrilobular emphysema. Rounded masslike consolidation in the medial aspect of the superior segment right lower lobe measures approximately 4.7 x 4.9 cm, is new from 07/07/2013, has internal ossific/calcific debris and obscures the extrapleural fat plane. Soft tissue extends around the adjacent vertebral body into the left extrapleural space. Trace right pleural effusion. Lungs are otherwise clear. Musculoskeletal: Lucency and destruction of the T7 vertebral body is seen with surrounding soft tissue. There is lysis within the anterior aspect of the T6 vertebral body as well. Airway is unremarkable. CT ABDOMEN PELVIS FINDINGS Hepatobiliary: Liver and gallbladder are unremarkable. No biliary ductal dilatation. Pancreas: Negative. Spleen: Negative. Adrenals/Urinary Tract: Adrenal glands are unremarkable. Inferior aspects of both kidneys are obscured by streak artifact from spinal hardware. Low-attenuation lesions in the left kidney measure up to 2.0 cm and are difficult to further characterize without post-contrast imaging. Ureters are decompressed. There are scattered bladder diverticulum. Stomach/Bowel: Stomach, small bowel, appendix and colon are unremarkable.  Vascular/Lymphatic: Atherosclerotic calcification of the arterial vasculature without abdominal aortic aneurysm. Postoperative changes are seen at the aortic bifurcation. No pathologically enlarged lymph nodes. Reproductive: Prostate is slightly enlarged and calcified. Other: No free fluid.  Mesenteries and peritoneum are unremarkable. Musculoskeletal: Postoperative changes in the lumbar spine. No additional worrisome lytic or sclerotic lesions. Bone graft harvest sites in the iliac wings. IMPRESSION: 1. Masslike consolidation in the right lower lobe with invasion of the extrapleural fat, worrisome for primary bronchogenic carcinoma. Lymphoma and sarcoma are considered less likely. 2. Lytic destruction and compression of the T7 vertebral body with surrounding soft tissue, consistent with a pathologic fracture. Minimal involvement of the T6 vertebral body. 3. Trace right pleural effusion. 4. No evidence of primary malignancy or metastatic disease in the abdomen or pelvis. 5.  Aortic atherosclerosis (ICD10-170.0). Electronically Signed   By: Lorin Picket M.D.   On: 12/14/2015 14:21   Ct Guided Needle Placement  Result Date: 12/18/2015 INDICATION: 75 year old male with a history of right lower lobe/posterior mediastinal mass with destruction of the T7 vertebral body. He also has new onset paraplegia. CT-guided biopsy is warranted to obtain tissue diagnosis. EXAM: CT GUIDANCE NEEDLE PLACEMENT MEDICATIONS: None. ANESTHESIA/SEDATION: Moderate (conscious) sedation was employed during this procedure. A total of Versed 2 mg and Fentanyl 100 mcg was administered intravenously. Moderate Sedation Time: 14 minutes. The patient's level of consciousness and vital signs were monitored continuously by radiology nursing throughout the procedure under my direct supervision. FLUOROSCOPY TIME:  Fluoroscopy Time: 0 minutes 0 seconds (0 mGy). COMPLICATIONS: None immediate. PROCEDURE: Informed written consent was obtained from the  patient after a thorough discussion of the procedural risks, benefits and alternatives. All questions were addressed. Maximal Sterile Barrier Technique was utilized including caps, mask, sterile gowns, sterile gloves, sterile drape, hand hygiene and skin antiseptic. A timeout was performed prior to the initiation of the procedure. A planning axial CT scan was performed. The infiltrative mass along the medial aspect of the right lower lobe directly invading the posterior mediastinum was successfully identified. A suitable skin entry site was selected and marked. The region was sterilely prepped and draped in the standard fashion using chlorhexidine skin prep. Local anesthesia was attained by infiltration with 1% lidocaine. A small dermatotomy was made. Under intermittent CT guidance, a 17 gauge introducer needle was advanced into the margin of the soft tissue mass. Multiple 18 gauge core  biopsies were then coaxially obtained using the bio Pince automated biopsy device. Biopsy specimens were placed in saline and delivered to pathology for further analysis. Additional 20 gauge needle aspirate was performed and inject into a small amount of sterile saline for cultures. Post biopsy axial CT imaging demonstrates no evidence of pneumothorax, hemorrhage or other complicating feature. IMPRESSION: Technically successful CT-guided core biopsy of right lower lobe/ posterior mediastinal mass lesion. Signed, Criselda Peaches, MD [facility] Electronically Signed   By: Jacqulynn Cadet M.D.   On: 12/18/2015 12:58   Mr Thoracic Spine Wo Contrast  Result Date: 12/26/2015 CLINICAL DATA:  Re- evaluation of known compression fracture, infection, abscess. EXAM: MRI THORACIC SPINE WITHOUT CONTRAST TECHNIQUE: Multiplanar, multisequence MR imaging of the thoracic spine was performed. No intravenous contrast was administered. COMPARISON:  The prior MRI from 12/16/2015.  FINDINGS: Examination is somewhat limited as the patient was unable to tolerate the full length of the exam. No contrast was administered on this study. A Alignment: Alignment is stable with preservation of the normal thoracic kyphosis. No listhesis. Vertebrae: Again seen is abnormal T1 hypo intense signal intensity involving the T6 and T7 vertebral bodies with associated T2/STIR hyperintensity, compatible with edema. Overall, changes are relatively similar as compared to prior study. Again, changes are contiguous with the right posterior chest wall lesion. The chest wall lesion appears overall decreased in size with decreased inflammatory changes as compared to prior MRI. This lesion measures approximately 3.2 x 2.6 cm on today's study, previously 4.4 x 4.1 cm. While no contrast was administered on this exam, the previously noted dorsal enhancing collection extending from T5 through T8-9 also appears decreased in size from previous exam, most evident on sagittal T2 weighted sequence (series 5, image 9). This collection measures up to 5 mm in maximal thickness at the level of T7-8. Associated spinal canal stenosis with mass effect on the thecal sac is improved, although remains moderate to severe at the T7 level (series 10, image 27). Thecal sac measures 8 mm in AP diameter at this level. Patchy T2 signal abnormality within the cord at T7 and T8 again seen, worrisome for myelopathy. Compression deformity involving the T7 vertebral body is relatively stable with associated approximately 50% height loss and 4 mm bony retropulsion. No other new finding within the thoracic spine. Degenerative disc bulge noted at T12-L1 without stenosis. Layering left pleural effusion noted. IMPRESSION: 1. Similar appearance of abnormal signal intensity involving the T6 and T7 vertebral bodies with associated T7 compression fracture, with direct extension into the right lower lobe pulmonary process. Overall, inflammatory changes within  the right lower lobe are improved, with decreased size of right lower lobe lesion as compared to prior MRI from 12/16/2015. Given the interval improvement, changes suggest an improving infectious process. T7 compression fracture with 50% height loss and 4 mm bony retropulsion is stable. 2. 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 3. Overall improvement in previously seen spinal canal stenosis, although there remains persistent moderate to severe stenosis at the level of T7. 4. 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 5. Layering left pleural effusion. Electronically Signed   By: Jeannine Boga M.D.   On: 12/26/2015 23:38   Mr Thoracic Spine W Wo Contrast  Result Date: 12/16/2015 CLINICAL DATA:  Invasive thoracic mass. Bilateral lower extremity anesthesia. EXAM: MRI THORACIC SPINE WITHOUT AND WITH CONTRAST TECHNIQUE:  Multiplanar and multiecho pulse sequences of the thoracic spine were obtained without and with intravenous contrast. CONTRAST:  22mL MULTIHANCE GADOBENATE DIMEGLUMINE 529 MG/ML IV SOLN COMPARISON:  Chest CT 12/14/2015 FINDINGS: There is loss of the normal T1 weighted signal within the T6 and T7 vertebral bodies with associated contrast enhancement. This is in continuity with a posterior right chest wall mass that is better characterized on the recent chest CT. On the current examination, this mass measures approximately 4.4 x 4.1 cm. There is a peripherally enhancing collection along the dorsal aspect of the spinal canal extending from the level of the T5-T6 disc to the T9 level, measuring up to 5 mm in thickness. This contributes to severe attenuation of the thecal sac and compression of the spinal cord. There is increased T2 weighted signal within the spinal cord at the T7 and T8 levels suggesting compressive myelopathy. Additionally,  there is compression deformity of the T7 body with approximately 50% height loss centrally and mild retropulsion. The other thoracic levels are unremarkable without spinal canal stenosis. IMPRESSION: 1. Abnormal signal and contrast enhancement of the T6 and T7 vertebral bodies with associated T7 compression fracture. This is likely secondary to direct extension of the process within the right lower lobe. While this may be extension of a pulmonary malignancy invading the vertebral column, an infectious process of the lung with an associated discitis-osteomyelitis is also a possibility. Given this constellation of findings, atypical infectious processes (such as tuberculosis) should be considered. 2. 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 3. 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 Critical Value/emergent results were called by telephone at the time of interpretation on 12/16/2015 at 6:53 pm to Dr. Raiford Noble , who verbally acknowledged these results. Electronically Signed   By: Ulyses Jarred M.D.   On: 12/16/2015 18:54   Ir Angiogram Visceral Selective  Result Date: 12/27/2015 CLINICAL DATA:  GI bleed. Endoscopy suggests proximal jejunal source. Known duodenal ulcer, but no active bleeding seen on recent endoscopy. EXAM: SELECTIVE VISCERAL ARTERIOGRAPHY; IR ULTRASOUND GUIDANCE VASC ACCESS RIGHT ANESTHESIA/SEDATION: Intravenous Fentanyl and Versed were administered as conscious sedation during continuous monitoring of the patient's level of consciousness and physiological / cardiorespiratory status by the radiology RN, with a total moderate sedation time of 32 minutes. MEDICATIONS: Lidocaine 1% subcutaneous PROCEDURE: The procedure, risks (including  but not limited to bleeding, infection, organ damage ), benefits, and alternatives were explained to the patient. Questions regarding the procedure were encouraged and answered. The patient understands and consents to the procedure. Right femoral region prepped and draped in usual sterile fashion. Maximal barrier sterile technique was utilized including caps, mask, sterile gowns, sterile gloves, sterile drape, hand hygiene and skin antiseptic. The right common femoral artery was localized under ultrasound. Under real-time ultrasound guidance, the vessel was accessed with a 21-gauge micropuncture needle, exchanged over a 018 guidewire for a transitional dilator, through which a 035 guidewire was advanced. Over this, a 5 [place] vascular sheath was placed, through which a 5 [place] C2 catheter was advanced and used to selectively catheterize the celiac axis for selective arteriography in multiple projections. The C2 was then utilized to selectively catheterize the superior mesenteric artery for selective arteriography in multiple projections. The C2 was then exchanged for a RIM catheter, utilized  to selectively catheterize the inferior mesenteric artery for selective arteriography in 2 projections. The catheter and sheath were removed and hemostasis achieved with the aid of the Exoseal device after confirmatory femoral arteriography. The patient tolerated the procedure well. COMPLICATIONS: None immediate FINDINGS: No evidence of active extravasation, early draining vein, AVM, or other lesion to suggest a site or etiology of the patient's GI bleeding. 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 Venous phase confirms patency of the IMV and portal venous system. IMPRESSION: 1. Negative three-vessel mesenteric arteriogram. No evidence of active extravasation or other focal lesion to suggest etiology of GI bleed. Electronically Signed   By: Lucrezia Europe M.D.   On: 12/27/2015 16:56   Ir  Angiogram Visceral Selective  Result Date: 12/27/2015 CLINICAL DATA:  GI bleed. Endoscopy suggests proximal jejunal source. Known duodenal ulcer, but no active bleeding seen on recent endoscopy. EXAM: SELECTIVE VISCERAL ARTERIOGRAPHY; IR ULTRASOUND GUIDANCE VASC ACCESS RIGHT ANESTHESIA/SEDATION: Intravenous Fentanyl and Versed were administered as conscious sedation during continuous monitoring of the patient's level of consciousness and physiological / cardiorespiratory status by the radiology RN, with a total moderate sedation time of 32 minutes. MEDICATIONS: Lidocaine 1% subcutaneous PROCEDURE: The procedure, risks (including but not limited to bleeding, infection, organ damage ), benefits, and alternatives were explained to the patient. Questions regarding the procedure were encouraged and answered. The patient understands and consents to the procedure. Right femoral region prepped and draped in usual sterile fashion. Maximal barrier sterile technique was utilized including caps, mask, sterile gowns, sterile gloves, sterile drape, hand hygiene and skin antiseptic. The right common femoral artery was localized under ultrasound. Under real-time ultrasound guidance, the vessel was accessed with a 21-gauge micropuncture needle, exchanged over a 018 guidewire for a transitional dilator, through which a 035 guidewire was advanced. Over this, a 5 [place] vascular sheath was placed, through which a 5 [place] C2 catheter was advanced and used to selectively catheterize the celiac axis for selective arteriography in multiple projections. The C2 was then utilized to selectively catheterize the superior mesenteric artery for selective arteriography in multiple projections. The C2 was then exchanged for a RIM catheter, utilized to selectively catheterize the inferior mesenteric artery for selective arteriography in 2 projections. The catheter and sheath were removed and hemostasis achieved with the aid of the Exoseal device  after confirmatory femoral arteriography. The patient tolerated the procedure well. COMPLICATIONS: None immediate FINDINGS: No evidence of active extravasation, early draining vein, AVM, or other lesion to suggest a site or etiology of the patient's GI bleeding. 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 Venous phase confirms patency of the IMV and portal venous system. IMPRESSION: 1. Negative three-vessel mesenteric arteriogram. No evidence of active extravasation or other focal lesion to suggest etiology of GI bleed. Electronically Signed   By: Lucrezia Europe M.D.   On: 12/27/2015 16:56   Ir Angiogram Visceral Selective  Result Date: 12/27/2015 CLINICAL DATA:  GI bleed. Endoscopy suggests proximal jejunal source. Known duodenal ulcer, but no active bleeding seen on recent endoscopy. EXAM: SELECTIVE VISCERAL ARTERIOGRAPHY; IR ULTRASOUND GUIDANCE VASC ACCESS RIGHT ANESTHESIA/SEDATION: Intravenous Fentanyl and Versed were administered as conscious sedation during continuous monitoring of the patient's level of consciousness and physiological / cardiorespiratory status by the radiology RN, with a total moderate sedation time of 32 minutes. MEDICATIONS: Lidocaine 1% subcutaneous PROCEDURE: The procedure, risks (including but not limited to bleeding, infection, organ damage ), benefits, and alternatives were  explained to the patient. Questions regarding the procedure were encouraged and answered. The patient understands and consents to the procedure. Right femoral region prepped and draped in usual sterile fashion. Maximal barrier sterile technique was utilized including caps, mask, sterile gowns, sterile gloves, sterile drape, hand hygiene and skin antiseptic. The right common femoral artery was localized under ultrasound. Under real-time ultrasound guidance, the vessel was accessed with a 21-gauge micropuncture needle, exchanged over a 018 guidewire for a transitional dilator,  through which a 035 guidewire was advanced. Over this, a 5 [place] vascular sheath was placed, through which a 5 [place] C2 catheter was advanced and used to selectively catheterize the celiac axis for selective arteriography in multiple projections. The C2 was then utilized to selectively catheterize the superior mesenteric artery for selective arteriography in multiple projections. The C2 was then exchanged for a RIM catheter, utilized to selectively catheterize the inferior mesenteric artery for selective arteriography in 2 projections. The catheter and sheath were removed and hemostasis achieved with the aid of the Exoseal device after confirmatory femoral arteriography. The patient tolerated the procedure well. COMPLICATIONS: None immediate FINDINGS: No evidence of active extravasation, early draining vein, AVM, or other lesion to suggest a site or etiology of the patient's GI bleeding. 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 Venous phase confirms patency of the IMV and portal venous system. IMPRESSION: 1. Negative three-vessel mesenteric arteriogram. No evidence of active extravasation or other focal lesion to suggest etiology of GI bleed. Electronically Signed   By: Lucrezia Europe M.D.   On: 12/27/2015 16:56   US Abdomen Limited  Result Date: 01/06/2016 CLINICAL DATA:  Left upper quadrant pain EXAM: LIMITED ABDOMINAL ULTRASOUND COMPARISON:  CT 12/14/2015 FINDINGS: Limited ultrasound of left upper quadrant performed for evaluation of the spleen. The spleen measures 4.9 x 9.8 x 4.5 cm with a volume of 111.6 cubic cm. Incidentally noted is a large left-sided pleural effusion. IMPRESSION: 1. The spleen is within normal limits 2. Large left-sided pleural effusion Electronically Signed   By: Donavan Foil M.D.   On: 01/06/2016 16:47   Ir US Guide Vasc Access Right  Result Date: 12/27/2015 CLINICAL DATA:  GI bleed. Endoscopy suggests proximal jejunal source. Known  duodenal ulcer, but no active bleeding seen on recent endoscopy. EXAM: SELECTIVE VISCERAL ARTERIOGRAPHY; IR ULTRASOUND GUIDANCE VASC ACCESS RIGHT ANESTHESIA/SEDATION: Intravenous Fentanyl and Versed were administered as conscious sedation during continuous monitoring of the patient's level of consciousness and physiological / cardiorespiratory status by the radiology RN, with a total moderate sedation time of 32 minutes. MEDICATIONS: Lidocaine 1% subcutaneous PROCEDURE: The procedure, risks (including but not limited to bleeding, infection, organ damage ), benefits, and alternatives were explained to the patient. Questions regarding the procedure were encouraged and answered. The patient understands and consents to the procedure. Right femoral region prepped and draped in usual sterile fashion. Maximal barrier sterile technique was utilized including caps, mask, sterile gowns, sterile gloves, sterile drape, hand hygiene and skin antiseptic. The right common femoral artery was localized under ultrasound. Under real-time ultrasound guidance, the vessel was accessed with a 21-gauge micropuncture needle, exchanged over a 018 guidewire for a transitional dilator, through which a 035 guidewire was advanced. Over this, a 5 [place] vascular sheath was placed, through which a 5 [place] C2 catheter was advanced and used to selectively catheterize the celiac axis for selective arteriography in multiple projections. The C2 was then utilized to selectively catheterize the superior mesenteric artery for selective arteriography  in multiple projections. The C2 was then exchanged for a RIM catheter, utilized to selectively catheterize the inferior mesenteric artery for selective arteriography in 2 projections. The catheter and sheath were removed and hemostasis achieved with the aid of the Exoseal device after confirmatory femoral arteriography. The patient tolerated the procedure well. COMPLICATIONS: None immediate FINDINGS: No  evidence of active extravasation, early draining vein, AVM, or other lesion to suggest a site or etiology of the patient's GI bleeding. 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 Venous phase confirms patency of the IMV and portal venous system. IMPRESSION: 1. Negative three-vessel mesenteric arteriogram. No evidence of active extravasation or other focal lesion to suggest etiology of GI bleed. Electronically Signed   By: Lucrezia Europe M.D.   On: 12/27/2015 16:56   Dg Chest Port 1 View  Result Date: 01/08/2016 CLINICAL DATA:  Shortness of breath, back pain. EXAM: PORTABLE CHEST 1 VIEW COMPARISON:  Portable chest x-ray of December 27, 2015 FINDINGS: The right lung is well-expanded and clear. On the left the retrocardiac region is dense. There is pleural fluid layering posteriorly and laterally. The heart and pulmonary vascularity are normal. The mediastinum is normal in width. There is calcification in the wall of the aortic arch. The PICC line tip projects just above the cavoatrial junction. The bony thorax is unremarkable where visualized. IMPRESSION: Moderate-sized pleural effusion on the left layering posteriorly. Left lower lobe atelectasis or pneumonia. Mild cardiomegaly without pulmonary vascular congestion. Aortic atherosclerosis. Electronically Signed   By: David  Martinique M.D.   On: 01/08/2016 12:16   Dg Chest Port 1 View  Result Date: 12/27/2015 CLINICAL DATA:  Shortness of breath.  Post EGD for GI bleed. EXAM: PORTABLE CHEST 1 VIEW COMPARISON:  12/17/2015 and CT chest 12/14/2015 FINDINGS: Interval placement of right-sided PICC line with tip over the SVC. Lungs are adequately inflated with moderate interval improvement in the airspace process over the medial superior segment right lower lobe. No effusion. Cardiomediastinal silhouette is within normal. There is minimal calcified plaque over the aortic arch. Remainder of the exam is unchanged. IMPRESSION: Interval  improvement in airspace process over the medial aspect superior segment right lower lobe likely resolving pneumonia. Right-sided PICC line with tip over the SVC. Aortic atherosclerosis. Electronically Signed   By: Marin Olp M.D.   On: 12/27/2015 14:02   Dg Chest Port 1 View  Result Date: 12/14/2015 CLINICAL DATA:  75 year old male with a history of chest pain EXAM: PORTABLE CHEST 1 VIEW COMPARISON:  11/28/2015 FINDINGS: Double density in the right hilum, new from the comparison. No pleural effusion or pneumothorax. No confluent airspace disease. No displaced fracture. IMPRESSION: Double density in the right hilum, new from the comparison plain film in September. Given the rapid development, this may reflect a developing infection in the medial lung, however, contrast-enhanced CT is recommended to rule out adenopathy or mass. Signed, Dulcy Fanny. Earleen Newport, DO [facility] Electronically Signed   By: Corrie Mckusick D.O.   On: 12/14/2015 09:28    Lab Data:  CBC:  Recent Labs Lab 01/04/16 1457  01/06/16 0425 01/07/16 0319 01/07/16 0930 01/08/16 1100 01/08/16 2241 01/09/16 0915  WBC 1.8*  < > 1.8* 0.8*  --  1.1* 1.9* 2.3*  NEUTROABS 1.6*  --   --   --   --   --   --   --   HGB 6.4*  < > 7.7* 6.9* 8.5* 7.4* 6.5* 7.4*  HCT 19.6*  < > 23.1* 20.5* 24.9* 21.8* 19.2* 22.3*  MCV 94.2  < > 90.6 90.3  --  89.3 90.6 89.6  PLT 46*  < > 33* 25*  --  24* 22* 20*  < > = values in this interval not displayed. Basic Metabolic Panel:  Recent Labs Lab 01/04/16 0915 01/07/16 0319 01/09/16 0915  NA 140 137 138  K 3.8 3.6 3.7  CL 111 109 111  CO2 24 22 24   GLUCOSE 109* 116* 92  BUN 22* 23* 25*  CREATININE 0.50* 0.53* 0.62  CALCIUM 7.4* 7.0* 6.8*   GFR: Estimated Creatinine Clearance: 92.8 mL/min (by C-G formula based on SCr of 0.62 mg/dL). Liver Function Tests:  Recent Labs Lab 01/09/16 0915  AST 47*  ALT 113*  ALKPHOS 116  BILITOT  0.5  PROT 3.4*  ALBUMIN 1.3*   No results for input(s): LIPASE, AMYLASE in the last 168 hours.  Recent Labs Lab 01/04/16 0915  AMMONIA 22   Coagulation Profile:  Recent Labs Lab 01/07/16 0319  INR 1.08   Cardiac Enzymes:  Recent Labs Lab 01/05/16 1845  CKTOTAL 84   BNP (last 3 results) No results for input(s): PROBNP in the last 8760 hours. HbA1C: No results for input(s): HGBA1C in the last 72 hours. CBG:  Recent Labs Lab 01/06/16 0734 01/06/16 1146 01/06/16 1640 01/07/16 0815 01/07/16 1122  GLUCAP 79 89 127* 141* 126*   Lipid Profile: No results for input(s): CHOL, HDL, LDLCALC, TRIG, CHOLHDL, LDLDIRECT in the last 72 hours. Thyroid Function Tests: No results for input(s): TSH, T4TOTAL, FREET4, T3FREE, THYROIDAB in the last 72 hours. Anemia Panel:  Recent Labs  01/07/16 1435  RETICCTPCT 0.5   Urine analysis:    Component Value Date/Time   COLORURINE YELLOW 12/26/2015 1515   APPEARANCEUR CLEAR 12/26/2015 1515   APPEARANCEUR Clear 12/20/2014 1041   LABSPEC 1.025 12/26/2015 1515   PHURINE 6.0 12/26/2015 1515   GLUCOSEU NEGATIVE 12/26/2015 1515   HGBUR SMALL (A) 12/26/2015 1515   BILIRUBINUR NEGATIVE 12/26/2015 1515   BILIRUBINUR Negative 12/20/2014 [place] 12/26/2015 1515   PROTEINUR NEGATIVE 12/26/2015 1515   UROBILINOGEN 0.2 06/06/2014 1332   NITRITE NEGATIVE 12/26/2015 1515   LEUKOCYTESUR NEGATIVE 12/26/2015 1515   LEUKOCYTESUR 3+ (A) 12/20/2014 1041     Torin Modica M.D. [facility] 01/09/2016, 12:53 PM  Pager: [phone] Between 7am to 7pm - call Pager - [phone]  After 7pm go to [email or web address] - password [email or web address]  Call night coverage person covering after 7pm

## 2016-01-09 NOTE — Progress Notes (Signed)
Daily Progress Note   Patient Name: Marcus [name]       Date: 01/09/2016 DOB: 05-05-1940  Age: 75 y.o. MRN#: [id number] Attending Physician: Mendel Corning, MD Primary Care Physician: Jeanmarie Hubert, MD Admit Date: 12/25/2015  Reason for Consultation/Follow-up: Establishing goals of care, Non pain symptom management, Pain control and Psychosocial/spiritual support  Subjective: I met again this morning with Marcus [name], wife, and 2 daughters at bedside. Marcus [name] was fluctuating on his wishes somewhat yesterday. Daughter believes that he may have been trying to please them by agreeing to more testing. He is clear today and family is supportive today of transition for full comfort. Will continue with palliative thoracentesis as he is having pain near area of effusion which has not been his previous pain complaint. Will also send sample for cytology as family would like to have some answers if possible.   Discussed jointly with Dr. Tana Coast as well. Will d/c labs, d/c antibiotics, no further blood transfusions. Full comfort care. All questions/concerns addressed. Emotional support provided.   Length of Stay: 15  Current Medications: Scheduled Meds:  . dexamethasone  2 mg Intravenous Q24H  . diltiazem  180 mg Oral Daily  . feeding supplement (PRO-STAT SUGAR FREE 64)  30 mL Oral TID BM  . fentaNYL  75 mcg Transdermal Q72H  . LORazepam  0.5 mg Oral QHS   And  . fentaNYL (SUBLIMAZE) injection  100 mcg Intravenous QHS  . fluconazole  100 mg Oral Daily  . lidocaine (PF)      . OLANZapine  5 mg Oral QHS  . pantoprazole  40 mg Oral BID  . senna-docusate  1 tablet Oral QHS  . sodium chloride flush  3 mL Intravenous Q12H  . timolol  1 drop Both Eyes BID    Continuous Infusions: . lactated ringers  Stopped (12/29/15 0700)    PRN Meds: acetaminophen **OR** acetaminophen, alum & mag hydroxide-simeth, feeding supplement (ENSURE ENLIVE), fentaNYL (SUBLIMAZE) injection, ipratropium-albuterol, LORazepam, magic mouthwash, nicotine polacrilex, ondansetron (ZOFRAN) IV, promethazine **OR** promethazine **OR** promethazine, sodium chloride flush  Physical Exam  Constitutional: He is oriented to person, place, and time. He appears well-developed and well-nourished.  HENT:  Head: Normocephalic and atraumatic.  Cardiovascular: Normal rate and regular rhythm.   Pulmonary/Chest: Effort normal. No accessory muscle usage. No tachypnea. No  respiratory distress.  Abdominal: Soft. Normal appearance.  Genitourinary:  Genitourinary Comments: Foley catheter  Neurological: He is alert and oriented to person, place, and time.  Skin: Skin is warm and dry.  Psychiatric: His mood appears not anxious. He is not agitated. He is attentive.  Nursing note and vitals reviewed.           Vital Signs: BP (!) 99/51   Pulse 63   Temp 98.1 F (36.7 C) (Oral)   Resp 18   Ht 5' 7"  (1.702 m)   Wt 97.4 kg (214 lb 11.2 oz)   SpO2 96%   BMI 33.63 kg/m  SpO2: SpO2: 96 % O2 Device: O2 Device: Not Delivered O2 Flow Rate: O2 Flow Rate (L/min): 1 L/min  Intake/output summary:   Intake/Output Summary (Last 24 hours) at 01/09/16 1158 Last data filed at 01/09/16 0719  Gross per 24 hour  Intake             1208 ml  Output              900 ml  Net              308 ml   LBM: Last BM Date: 01/08/16 Baseline Weight: Weight: 94.1 kg (207 lb 8 oz) Most recent weight: Weight: 97.4 kg (214 lb 11.2 oz)       Palliative Assessment/Data: PPS: 20%   Flowsheet Rows   Flowsheet Row Most Recent Value  Intake Tab  Referral Department  Hospitalist  Unit at Time of Referral  Med/Surg Unit  Palliative Care Primary Diagnosis  Sepsis/Infectious Disease  Date Notified  01/03/16  Palliative Care Type  New Palliative care    Reason for referral  Pain, Clarify Goals of Care, Psychosocial or Spiritual support, Non-pain Symptom  Date of Admission  12/14/15  Date first seen by Palliative Care  01/04/16  # of days Palliative referral response time  1 Day(s)  # of days IP prior to Palliative referral  20  Clinical Assessment  Palliative Performance Scale Score  30%  Pain Max last 24 hours  Not able to report  Pain Min Last 24 hours  Not able to report  Dyspnea Max Last 24 Hours  Not able to report  Dyspnea Min Last 24 hours  Not able to report  Nausea Max Last 24 Hours  Not able to report  Nausea Min Last 24 Hours  Not able to report  Anxiety Max Last 24 Hours  Not able to report  Other Max Last 24 Hours  Not able to report  Psychosocial & Spiritual Assessment  Palliative Care Outcomes  Palliative Care follow-up planned  Yes, Facility      Patient Active Problem List   Diagnosis Date Noted  . DNR (do not resuscitate)   . Thrombocytopenia ([place])   . Pancytopenia ([place]) 01/06/2016  . Uncontrolled pain   . Compression fracture of thoracic spine, non-traumatic ([place])   . Palliative care encounter   . Inadequate pain control 01/02/2016  . Acute confusional state 01/02/2016  . Leukocytosis   . Myopathy   . Other abnormalities of gait and mobility   . Left leg DVT ([place]) 12/25/2015  . Acute blood loss anemia 12/25/2015  . Gastrointestinal hemorrhage associated with duodenal ulcer 12/25/2015  . Melena   . 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 Paraplegia ([place]) 12/24/2015  . Neurogenic bowel   . Neurogenic bladder   . Hyperlipidemia   . T6 spinal cord injury (  [place])   . MRSA infection 12/23/2015  . MSSA (methicillin susceptible Staphylococcus aureus) septicemia ([place]) 12/23/2015  . Pressure injury of skin 12/19/2015  . Closed fracture of seventh thoracic vertebra ([place])   . T7 vertebral fracture ([place])   . Decreased sensation   . Atrial fibrillation with RVR ([place]) 12/14/2015  . Fall 12/14/2015  .  Community acquired pneumonia of left lung 12/14/2015  . Compression fracture of vertebra ([place]) 12/14/2015  . Urinary retention 12/14/2015  . PAF (paroxysmal atrial fibrillation) ([place]) 12/14/2015  . Lung mass 12/14/2015  . Lower extremity weakness 12/14/2015  . SOB (shortness of breath) 11/12/2015  . Chest pain 11/12/2015  . S/P carotid endarterectomy 04/10/2015  . 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 Glaucoma 03/20/2015  . Diabetes ([place]) 10/10/2014  . HLD (hyperlipidemia) 10/10/2014  . 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 Essential hypertension   . Carotid stenosis   . TIA (transient ischemic attack) 06/01/2014  . Malaise and fatigue 11/08/2013  . Lumbago   . Benign essential tremor   . Vitamin D deficiency     Palliative Care Assessment & Plan   Patient Profile: 75 y.o. male  with past medical history of Atrial fibrillation, hypertension, diabetes, CVA, GI bleed admitted on 12/25/2015 with generalized weakness, failure to thrive, after a recent fall. He was initially admitted to the hospital on 12/14/2015 then was transferred to inpatient rehabilitation, and now is readmitted to the hospitalist service on 12/25/2015. His initial chest x-ray on 12/14/2015 revealed community-acquired pneumonia as well as findings of a right lower lobe mass, closed fracture of T7 vertebrae. Patient's biopsy of right lower lobe mass revealed inflammatory changes. 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 He is being treated with antibiotics. Despite supportive therapy, antibiotics extensive workup his mood has remained very labile, ongoing reports of pain, as well as inability to move his lower extremities.. He has been seen by psychiatry and felt to have capacity.  Additionally he has thrombocytopenia, platelets 62, and an albumin level of 1.5 On 11 07/02/2015  he has developed pancytopenia, white blood cell count 1.9; hemoglobin 7.0 after being transfused 1 unit (was 6.4 on 01/04/2016), platelets 47. Oncology consult placed to Dr. Burney Gauze who did see patient on 01/05/2016. Labs continue to worsen with unknown etiology.    Recommendations/Plan:  Full comfort care.   Pain: Fentanyl transdermal patch at 75 g every 72 hours and fentanyl IV increased to 100-150 g every 30 min as needed for breakthrough pain.    Acute confusional state/delirium: Much improved but waxing/waning.   Anxiety: Continue Ativan 0.5 mg TID as needed.  Insomnia: Says trazodone does not help at all. Says that Ativan helps and pain is main reason for keeping him awake. Plan to receive dose of Ativan and fentanyl tonight at bedtime to try and help with sleep.   Goals of Care and Additional Recommendations:  Limitations on Scope of Treatment: Comfort care.   Code Status: DNR  Prognosis:   Could be days to weeks and at risk for bleeding with thrombocytopenia as well as clotting s/t known DVTs but unable to anticoagulate.   Discharge Planning:  Will work towards a transition towards [employer]. Will need much symptom management to assist with tolerance of transfer from pain perspective.   Thank you for allowing the Palliative Medicine Team to assist in the care of this patient.  Time In/Out: 0920-1030 Total Time 32mn Prolonged Time Billed  yes       Greater than 50%  of this time was spent counseling and coordinating care related to the above assessment and plan.  AVinie Sill NP Palliative Medicine Team Pager # [phone](7a-5p) Team Phone # [phone](Nights/Weekends)  Please contact Palliative Medicine Team phone at [phone] questions and concerns.

## 2016-01-09 NOTE — Progress Notes (Signed)
Notified physician that patient's BP 87/44 L arm, BP 104/53 L leg. Physician asked to retake BP manually. BP 88/48 L arm manually. Notified physician. Physician stated to allow blood transfusion to finish, recheck BP in an hour. Will recheck BP and notify physician. Will continue to monitor patient for changes.

## 2016-01-09 NOTE — Progress Notes (Signed)
Marcus [name] is now on [address]. He appears to have temporarily changed his mind regarding strict comfort measures. He is getting a blood transfusion. Last evening, his lab work showed his white cell count be 1.9. Hemoglobin 6.5 and platelet count 22,000.  I think what is telling is that his reticulocyte count when corrected is basically 0. This means that there is very little bone marrow functioning with respect to erythro-poiesis. His platelet count is slowly trending down. I would have to believe that his white cell count came up a little bit cause of the Neupogen that I ordered.  He had an abdominal ultrasound a couple days ago. This showed a normal spleen. However, there was a noted large left pleural effusion.  If he would be agreeable, then I think that the pleural effusion could be drained and we can send off for culture, and cytology. I then this could certainly give [place] some meaningful information as to what might be going on. This morning, he agrees have the procedure done. His wife was with [place]. However, I'm not sure, she really understands. I talked to her about this. She would be amenable to having this done if he agrees.  All his recent cultures are negative. He is afebrile. His blood pressure is on the low side. His heart rate is also on the lower side.  His LDH is slightly elevated. His heart and no what this really signifies.  A prealbumin might help in this situation. I'll see about ordering one.  On his physical exam, I really cannot find anything that is specific.  Again, it is hard to really know what his true wishes are. He has these periods of confusion. He has some periods of lucidity.  Again, I think that if we can get a thoracentesis done, this might be oh to help out a little bit.  As always, we prayed. He still has a strong faith.  Lattie Haw, MD  1 Corinthians 9:14

## 2016-01-09 NOTE — Procedures (Signed)
Ultrasound-guided diagnostic and therapeutic left thoracentesis performed yielding 0.73 liters of clear yellow colored fluid. No immediate complications. Follow-up chest x-ray pending.      Stevenson Windmiller E 12:18 PM 01/09/2016

## 2016-01-10 ENCOUNTER — Ambulatory Visit: Payer: Medicare Other

## 2016-01-10 ENCOUNTER — Encounter: Payer: Self-pay | Admitting: *Deleted

## 2016-01-10 DIAGNOSIS — I824Z3 Acute embolism and thrombosis of unspecified deep veins of distal lower extremity, bilateral: Secondary | ICD-10-CM

## 2016-01-10 DIAGNOSIS — D7589 Other specified diseases of blood and blood-forming organs: Secondary | ICD-10-CM

## 2016-01-10 LAB — TYPE AND SCREEN
ABO/RH(D): O NEG
ANTIBODY SCREEN: NEGATIVE
Unit division: 0

## 2016-01-10 MED ORDER — IPRATROPIUM-ALBUTEROL 0.5-2.5 (3) MG/3ML IN SOLN: 3.0000 mL | Freq: Four times a day (QID) | RESPIRATORY_TRACT | Status: AC | PRN

## 2016-01-10 MED ORDER — HEPARIN SOD (PORK) LOCK FLUSH 100 UNIT/ML IV SOLN
250.0000 [IU] | INTRAVENOUS | Status: AC | PRN
  Administered 2016-01-10: 250 [IU]

## 2016-01-10 MED ORDER — OLANZAPINE 5 MG PO TABS: 5.0000 mg | ORAL_TABLET | Freq: Every day | ORAL | Status: AC

## 2016-01-10 MED ORDER — DEXAMETHASONE SODIUM PHOSPHATE 4 MG/ML IJ SOLN: 2.0000 mg | INTRAMUSCULAR | Status: AC

## 2016-01-10 MED ORDER — FENTANYL 75 MCG/HR TD PT72: 75.0000 ug | MEDICATED_PATCH | TRANSDERMAL | 0 refills | Status: AC

## 2016-01-10 MED ORDER — PANTOPRAZOLE SODIUM 40 MG PO TBEC: 40.0000 mg | DELAYED_RELEASE_TABLET | Freq: Two times a day (BID) | ORAL | Status: AC

## 2016-01-10 MED ORDER — FLUCONAZOLE 100 MG PO TABS: 100.0000 mg | ORAL_TABLET | Freq: Every day | ORAL | Status: AC

## 2016-01-10 MED ORDER — ACETAMINOPHEN 500 MG PO TABS: 1000.0000 mg | ORAL_TABLET | Freq: Four times a day (QID) | ORAL | 0 refills | Status: AC | PRN

## 2016-01-10 MED ORDER — LORAZEPAM 0.5 MG PO TABS: 0.5000 mg | ORAL_TABLET | Freq: Every day | ORAL | 0 refills | Status: AC

## 2016-01-10 MED ORDER — MAGIC MOUTHWASH: 10.0000 mL | Freq: Four times a day (QID) | ORAL | 0 refills | Status: AC | PRN

## 2016-01-10 NOTE — Progress Notes (Signed)
Report called to Stephanie at [facility] 

## 2016-01-10 NOTE — Clinical Social Work Note (Signed)
Per MD patient ready to DC to [facility]. RN, patient/family Antony Madura), and facility notified of patient's DC. RN given number for report. DC packet on patient's chart. Ambulance transport requested for patient. CSW signing off at this time.   Liz Beach MSW, [place], [place], QN:[id number]

## 2016-01-10 NOTE — Progress Notes (Signed)
Mr. Steinhaus got through his thoracentesis without any difficulties yesterday. He had 750 mL of clear yellow fluid removed. As always, radiology did a great job in doing the procedure.  There are no results back as of yet. There is a very low LDH level. His total protein is also quite low. As such, I would expect that this fluid would be transudate. We will have to see what the microbiology and cytology shows. The Gram stain on the fluid showed rare white cells. No obvious organisms were noted.  He seems comfortable. His daughter is with him this morning. He seemed to rest pretty well last night.  He does not have any more labs drawn. I can understand this.  In a no from palliative care, it looks like getting him to be complacent might seem reasonable.  His prealbumin is 12.7. This actually is not as bad as I would've thought. His albumin itself is 1.3. His sodium is 138. Potassium is 3.7.  His blood pressure still on the lower side. It is 91/56. He is afebrile. On his physical exam, I really do not see anything different than yesterday. He may have some slightly better breath sounds over on the left side.  As always, we had a very good prayer session. He likes to pray with me. His daughter also prayed with [place]. She was grateful for the opportunity for [place] to pray together.  I just wish I had a better idea as to what was causing his bone marrow failure. He clearly response to Neupogen. His last white cell count, done yesterday, was 2.3. As such, he has some marrow functioning.  If I get anything with respect to his pleural fluid results that are positive, I will certainly let his wife know.  If he goes to [facility], I think this would be very reasonable.  Lattie Haw, MD  2 Corinthians 12:9-10

## 2016-01-10 NOTE — Discharge Summary (Signed)
Physician Discharge Summary   Patient ID: Marcus [name] MRN: [id number] DOB/AGE: March 03, 1940 75 y.o.  Admit date: 12/25/2015 Discharge date: 01/10/2016  Primary Care Physician:  Jeanmarie Hubert, MD  Discharge Diagnoses:    Marland Kitchen T7 vertebral fracture ([place]) . Urinary retention . Pressure injury of skin . Paraplegia ([place]) . MSSA (methicillin susceptible Staphylococcus aureus) septicemia ([place]) . MRSA infection . Melena . Lower extremity weakness . Essential hypertension . 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 Acute blood loss anemia . Atrial fibrillation with RVR ([place]) . Inadequate pain control . Acute confusional state   Consults:    GI  Cardiology  Interventional Radiology  Infectious Diseases  General Surgery  Hematology  Recommendations for Outpatient Follow-up:  1. COMFORT CARE    DIET: REGULAR DIET    Allergies:   Allergies  Allergen Reactions  . Codeine Other (See Comments)    constipation  . Demerol [Meperidine] Other (See Comments)    Drops blood pressure   . Contrast Media [Iodinated Diagnostic Agents] Nausea And Vomiting  . Gabapentin Other (See Comments)    Suicidal, depression  . Other Other (See Comments)    All narcotics make pt hallucinate     DISCHARGE MEDICATIONS: Current Discharge Medication List    START taking these medications   Details  acetaminophen (TYLENOL) 500 MG tablet Take 2 tablets (1,000 mg total) by mouth every 6 (six) hours as needed for mild pain, fever or headache (or Fever >/= 101). Qty: 30 tablet, Refills: 0    dexamethasone (DECADRON) 4 MG/ML injection Inject 0.5 mLs (2 mg total) into the vein daily. Qty: 1 mL    fentaNYL (DURAGESIC - DOSED MCG/HR) 75 MCG/HR Place 1 patch (75 mcg total) onto the skin every 3 (three) days. Qty: 5 patch, Refills: 0    fluconazole (DIFLUCAN) 100 MG tablet Take 1 tablet (100 mg total) by mouth daily.    ipratropium-albuterol (DUONEB)  0.5-2.5 (3) MG/3ML SOLN Take 3 mLs by nebulization every 6 (six) hours as needed. Qty: 360 mL    !! LORazepam (ATIVAN) 0.5 MG tablet Take 1 tablet (0.5 mg total) by mouth at bedtime. Qty: 30 tablet, Refills: 0    magic mouthwash SOLN Take 10 mLs by mouth 4 (four) times daily as needed for mouth pain. Refills: 0    OLANZapine (ZYPREXA) 5 MG tablet Take 1 tablet (5 mg total) by mouth at bedtime.    pantoprazole (PROTONIX) 40 MG tablet Take 1 tablet (40 mg total) by mouth 2 (two) times daily.     !! - Potential duplicate medications found. Please discuss with provider.    CONTINUE these medications which have NOT CHANGED   Details  diltiazem (CARDIZEM CD) 180 MG 24 hr capsule Take 1 capsule (180 mg total) by mouth daily.    !! LORazepam (ATIVAN) 0.5 MG tablet TAKE 1 TABLET BY MOUTH 3 TIMES A DAY AS NEEDED Qty: 90 tablet, Refills: 0    nicotine polacrilex (COMMIT) 2 MG lozenge Take 2 mg by mouth every 2 (two) hours.     ondansetron (ZOFRAN) 4 MG tablet Take 1 tablet (4 mg total) by mouth every 8 (eight) hours as needed for nausea or vomiting. Qty: 20 tablet, Refills: 0   Associated Diagnoses: Nausea without vomiting    polyethylene glycol (MIRALAX / GLYCOLAX) packet Take 17 g by mouth daily. Qty: 14 each, Refills: 0    senna-docusate (SENOKOT-S) 8.6-50 MG tablet Take 1 tablet by mouth 2 (two) times  daily.    timolol (BETIMOL) 0.5 % ophthalmic solution Place 1 drop into both eyes 2 (two) times daily.     !! - Potential duplicate medications found. Please discuss with provider.    STOP taking these medications     apixaban (ELIQUIS) 5 MG TABS tablet      atorvastatin (LIPITOR) 40 MG tablet      Cholecalciferol (VITAMIN D3) 5000 units TABS      clopidogrel (PLAVIX) 75 MG tablet      dexamethasone (DECADRON) 10 MG/ML injection      DULoxetine (CYMBALTA) 30 MG capsule      guaiFENesin-dextromethorphan (ROBITUSSIN DM) 100-10 MG/5ML syrup      HYDROcodone-acetaminophen  (NORCO/VICODIN) 5-325 MG tablet      vancomycin 1,250 mg in sodium chloride 0.9 % 250 mL          Brief H and P: For complete details please refer to admission H and P, but in brief Marcus [name] a 75 y.o.with a complicated medical history with 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  Hospital Course:     Pancytopenia - Per patient's oncologist Dr. Antonieta Pert recommendations, at this point very little bone marrow functioning with respect to erythropoiesis - Discussed with palliative medicine and family on 11/9, transitioned to Full Comfort Care status. Discontinued labs, antibiotics and no further blood transfusions at this point.  - Social worker consult placed for [employer] and bed available   Pleural effusion large left  - Ultrasound-guided Thoracentesis done on 11/9 for comfort, 0.73 L removed, patient feeling better.  Symptomatic anemia GI bleed Likely secondary to GI bleed, due to upper GI pathology. Suspect steroids have maintained a persistent ulcer which, with the addition of Eliquis, has been bleeding. S/p 6u PRBC since admission. EGD on 11/1 was significant for nonbleeding ulcer. Last transfusion was on 11/7. - At this point, discontinued labs and no further blood transfusions, transitioned to full comfort care  Esophageal candidiasis Seen on EGD. 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  -Patient made comfort care by palliative medicine, now focus on pain control and pain management regimen.  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 Patient afebrile. Resolved.    Day of Discharge BP 117/88 (BP Location: Left Arm)   Pulse 83   Temp 98.2 F (36.8 C) (Oral)   Resp 20   Ht 5\' 7"  (1.702 m)   Wt 97.4 kg (214 lb 11.2 oz)   SpO2 99%   BMI 33.63 kg/m   Physical Exam: General: Alert and awake oriented x3 not in any acute distress. HEENT: anicteric sclera, pupils reactive to light and accommodation CVS: S1-S2 clear no murmur rubs or gallops Chest: clear to auscultation bilaterally, no wheezing rales or rhonchi Abdomen: soft tender in periumbilical region, nondistended, normal bowel sounds Extremities: no cyanosis, clubbing. 2+  edema noted bilaterally Neuro: Cranial nerves II-XII intact, no focal neurological deficits   The results  of significant diagnostics from this hospitalization (including imaging, microbiology, ancillary and laboratory) are listed below for reference.    LAB RESULTS: Basic Metabolic Panel:  Recent Labs Lab 01/07/16 0319 01/09/16 0915  NA 137 138  K 3.6 3.7  CL 109 111  CO2 22 24  GLUCOSE 116* 92  BUN 23* 25*  CREATININE 0.53* 0.62  CALCIUM 7.0* 6.8*   Liver Function Tests:  Recent Labs Lab 01/09/16 0915  AST 47*  ALT 113*  ALKPHOS 116  BILITOT 0.5  PROT 3.4*  ALBUMIN 1.3*   No results for input(s): LIPASE, AMYLASE in the last 168 hours.  Recent Labs Lab 01/04/16 0915  AMMONIA 22   CBC:  Recent Labs Lab 01/04/16 1457  01/08/16 2241 01/09/16 0915  WBC 1.8*  < > 1.9* 2.3*  NEUTROABS 1.6*  --   --   --   HGB 6.4*  < > 6.5* 7.4*  HCT 19.6*  < > 19.2* 22.3*  MCV 94.2  < > 90.6 89.6  PLT 46*  < > 22* 20*  < > = values in this interval not displayed. Cardiac  Enzymes:  Recent Labs Lab 01/05/16 1845  CKTOTAL 84   BNP: Invalid input(s): POCBNP CBG:  Recent Labs Lab 01/07/16 0815 01/07/16 1122  GLUCAP 141* 126*    Significant Diagnostic Studies:  Mr Thoracic Spine Wo Contrast  Result Date: 12/26/2015 CLINICAL DATA:  Re- evaluation of known compression fracture, infection, abscess. EXAM: MRI THORACIC SPINE WITHOUT CONTRAST TECHNIQUE: Multiplanar, multisequence MR imaging of the thoracic spine was performed. No intravenous contrast was administered. COMPARISON:  The prior MRI from 12/16/2015. FINDINGS: Examination is somewhat limited as the patient was unable to tolerate the full length of the exam. No contrast was administered on this study. A Alignment: Alignment is stable with preservation of the normal thoracic kyphosis. No listhesis. Vertebrae: Again seen is abnormal T1 hypo intense signal intensity involving the T6 and T7 vertebral bodies with associated T2/STIR hyperintensity, compatible with edema. Overall, changes are relatively similar as compared to prior study. Again, changes are contiguous with the right posterior chest wall lesion. The chest wall lesion appears overall decreased in size with decreased inflammatory changes as compared to prior MRI. This lesion measures approximately 3.2 x 2.6 cm on today's study, previously 4.4 x 4.1 cm. While no contrast was administered on this exam, the previously noted dorsal enhancing collection extending from T5 through T8-9 also appears decreased in size from previous exam, most evident on sagittal T2 weighted sequence (series 5, image 9). This collection measures up to 5 mm in maximal thickness at the level of T7-8. Associated spinal canal stenosis with mass effect on the thecal sac is improved, although remains moderate to severe at the T7 level (series 10, image 27). Thecal sac measures 8 mm in AP diameter at this level. Patchy T2 signal abnormality within the cord at T7 and T8 again seen,  worrisome for myelopathy. Compression deformity involving the T7 vertebral body is relatively stable with associated approximately 50% height loss and 4 mm bony retropulsion. No other new finding within the thoracic spine. Degenerative disc bulge noted at T12-L1 without stenosis. Layering left pleural effusion noted. IMPRESSION: 1. Similar appearance of abnormal signal intensity involving the T6 and T7 vertebral bodies with associated T7 compression fracture, with direct extension into the right lower lobe pulmonary process. Overall, inflammatory changes within the right lower lobe are improved, with decreased size of right lower lobe lesion as compared to prior MRI from  12/16/2015. Given the interval improvement, changes suggest an improving infectious process. T7 compression fracture with 50% height loss and 4 mm bony retropulsion is stable. 2. 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 3. Overall improvement in previously seen spinal canal stenosis, although there remains persistent moderate to severe stenosis at the level of T7. 4. 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 5. Layering left pleural effusion. Electronically Signed   By: Jeannine Boga M.D.   On: 12/26/2015 23:38       Disposition and Follow-up:    DISPOSITION: [facility]    [place], MD. Schedule an appointment as soon as possible for a visit in 2 week(s).   Specialty:  Internal Medicine Contact information: [place] [address] [phone]            Time spent on Discharge: 27mins   Signed:   RAI,RIPUDEEP M.D. [facility] 01/10/2016, 3:04 PM Pager: [phone]

## 2016-01-10 NOTE — Progress Notes (Signed)
Daily Progress Note   Patient Name: Marcus [name]       Date: 01/10/2016 DOB: Apr 08, 1940  Age: 75 y.o. MRN#: [id number] Attending Physician: Mendel Corning, MD Primary Care Physician: Jeanmarie Hubert, MD Admit Date: 12/25/2015  Reason for Consultation/Follow-up: Inpatient hospice referral, Non pain symptom management, Pain control and Psychosocial/spiritual support  Subjective: Patient still confused but not nearly as  emotional and spiritual distress as when I first met him last weekend. He is now a DO NOT RESUSCITATE and wishes to pursue comfort care. Speaking with his wife, patient has been much less tearful except when talking about end-of-life and leaving his family. She herself excepts his terminal diagnosis once they understood that he is having bone marrow failure. Patient does not want further workup to specifically  diagnose  what is causing the failure. He also does not want any filters for his lower extremity DVTs. He is eating bites and sips of comfort-type foods. In reviewing his medication administration record he is still receiving multiple when necessary's of IV fentanyl. Talked to patient and family about increasing his patch and they were in agreement  Length of Stay: 16  Current Medications: Scheduled Meds:  . dexamethasone  2 mg Intravenous Q24H  . diltiazem  180 mg Oral Daily  . feeding supplement (PRO-STAT SUGAR FREE 64)  30 mL Oral TID BM  . fentaNYL  75 mcg Transdermal Q72H  . LORazepam  0.5 mg Oral QHS   And  . fentaNYL (SUBLIMAZE) injection  100 mcg Intravenous QHS  . fluconazole  100 mg Oral Daily  . OLANZapine  5 mg Oral QHS  . pantoprazole  40 mg Oral BID  . senna-docusate  1 tablet Oral QHS  . sodium chloride flush  3 mL Intravenous Q12H  . timolol  1  drop Both Eyes BID    Continuous Infusions: . lactated ringers Stopped (12/29/15 0700)    PRN Meds: acetaminophen **OR** acetaminophen, alum & mag hydroxide-simeth, feeding supplement (ENSURE ENLIVE), fentaNYL (SUBLIMAZE) injection, ipratropium-albuterol, LORazepam, magic mouthwash, nicotine polacrilex, ondansetron (ZOFRAN) IV, promethazine **OR** promethazine **OR** promethazine, sodium chloride flush  Physical Exam  Constitutional: He appears well-developed.  HENT:  Head: Normocephalic and atraumatic.  Neck: Normal range of motion.  Cardiovascular: Normal rate.   Genitourinary:  Genitourinary Comments: foley  Neurological: He is alert.  Oriented to self, generally how sick he is and that he is in the hospital  Skin: Skin is warm and dry.  Psychiatric:  Confused but not in distress. Thought processes tangnetial  Nursing note and vitals reviewed.           Vital Signs: BP (!) 91/56 (BP Location: Right Arm)   Pulse 70   Temp 98.1 F (36.7 C) (Oral)   Resp 20   Ht 5' 7"  (1.702 m)   Wt 97.4 kg (214 lb 11.2 oz)   SpO2 98%   BMI 33.63 kg/m  SpO2: SpO2: 98 % O2 Device: O2 Device: Not Delivered O2 Flow Rate: O2 Flow Rate (L/min): 1 L/min  Intake/output summary:  Intake/Output Summary (Last 24 hours) at 01/10/16 1229 Last data filed at 01/10/16 0545  Gross per 24 hour  Intake                0 ml  Output             1175 ml  Net            -1175 ml   LBM: Last BM Date: 01/08/16 Baseline Weight: Weight: 94.1 kg (207 lb 8 oz) Most recent weight: Weight: 97.4 kg (214 lb 11.2 oz)       Palliative Assessment/Data:    Flowsheet Rows   Flowsheet Row Most Recent Value  Intake Tab  Referral Department  Hospitalist  Unit at Time of Referral  Med/Surg Unit  Palliative Care Primary Diagnosis  Sepsis/Infectious Disease  Date Notified  01/03/16  Palliative Care Type  New Palliative care  Reason for referral  Pain, Clarify Goals of Care, Psychosocial or Spiritual support,  Non-pain Symptom  Date of Admission  12/14/15  Date first seen by Palliative Care  01/04/16  # of days Palliative referral response time  1 Day(s)  # of days IP prior to Palliative referral  20  Clinical Assessment  Palliative Performance Scale Score  30%  Pain Max last 24 hours  Not able to report  Pain Min Last 24 hours  Not able to report  Dyspnea Max Last 24 Hours  Not able to report  Dyspnea Min Last 24 hours  Not able to report  Nausea Max Last 24 Hours  Not able to report  Nausea Min Last 24 Hours  Not able to report  Anxiety Max Last 24 Hours  Not able to report  Other Max Last 24 Hours  Not able to report  Psychosocial & Spiritual Assessment  Palliative Care Outcomes  Palliative Care follow-up planned  Yes, Facility      Patient Active Problem List   Diagnosis Date Noted  . Terminal care   . Abscess   . DNR (do not resuscitate)   . Thrombocytopenia ([place])   . Pancytopenia ([place]) 01/06/2016  . Uncontrolled pain   . Compression fracture of thoracic spine, non-traumatic ([place])   . Palliative care encounter   . Inadequate pain control 01/02/2016  . Acute confusional state 01/02/2016  . Leukocytosis   . Myopathy   . Other abnormalities of gait and mobility   . Left leg DVT ([place]) 12/25/2015  . Acute blood loss anemia 12/25/2015  . Gastrointestinal hemorrhage associated with duodenal ulcer 12/25/2015  . Melena   . 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 Paraplegia ([place]) 12/24/2015  . Neurogenic bowel   . Neurogenic bladder   . Hyperlipidemia   . 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  MRSA infection 12/23/2015  . MSSA (methicillin susceptible Staphylococcus aureus) septicemia ([place]) 12/23/2015  . Pressure injury of skin 12/19/2015  . Closed fracture of seventh thoracic vertebra ([place])   . T7 vertebral fracture ([place])   . Decreased sensation   . Atrial fibrillation with RVR ([place]) 12/14/2015  . Fall 12/14/2015  . Community acquired pneumonia of left lung 12/14/2015    . Compression fracture of vertebra ([place]) 12/14/2015  . Urinary retention 12/14/2015  . PAF (paroxysmal atrial fibrillation) ([place]) 12/14/2015  . Lung mass 12/14/2015  . Lower extremity weakness 12/14/2015  . SOB (shortness of breath) 11/12/2015  . Chest pain 11/12/2015  . S/P carotid endarterectomy 04/10/2015  . 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 Glaucoma 03/20/2015  . Diabetes ([place]) 10/10/2014  . HLD (hyperlipidemia) 10/10/2014  . Cerebral infarction due to embolism of left carotid artery ([place])   . Essential hypertension   . Carotid stenosis   . TIA (transient ischemic attack) 06/01/2014  . Malaise and fatigue 11/08/2013  . Lumbago   . Benign essential tremor   . Vitamin D deficiency     Palliative Care Assessment & Plan   Patient Profile: 75 y.o.malewith past medical history of Atrial fibrillation, hypertension, diabetes, CVA, GI bleedadmitted on 10/25/2017with generalized weakness, failure to thrive, after a recent fall. He was initially admitted to the hospital on 12/14/2015 then was transferred to inpatient rehabilitation, and now is readmitted to the hospitalist service on 12/25/2015. His initial chest x-ray on 12/14/2015 revealed community-acquired pneumonia as well as findings of a right lower lobe mass, closed fracture of T7 vertebrae. Patient's biopsy of right lower lobe mass revealed inflammatory changes. He has developed paraplegia secondary to T7 compression fracture with associated discitis-osteomyelitis. He is being treated with antibiotics. Despite supportive therapy, antibiotics extensive workup his mood has remained very labile, ongoing reports of pain, as well as inability to move his lower extremities..He has been seen by psychiatry and felt to have capacity.  Additionally he has thrombocytopenia, platelets 62, and an albumin level of 1.5 On 11 07/02/2015 he has developed pancytopenia, white blood cell count  1.9; hemoglobin 7.0 after being transfused 1 unit (was 6.4 on 01/04/2016), platelets 47. Oncology consult placed to Dr. Burney Gauze . 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 Pt has refused IVC filters for bilat DVT's . He is now comfort care   Recommendations/Plan:  Consult in place for SW for in-pt residential hospice. I gave pt and family choice as designated by hospice medicare benefit. Called [facility] for transfer to [facility]  Will increase fentanyl TD 184mg q72 and cont PRN IV fentnayl  Delirium: Cont zyprexa 5 mg qHS   Goals of Care and Additional Recommendations:  Limitations on Scope of Treatment: Avoid Hospitalization, Minimize Medications, No Artificial Feeding, No Blood Transfusions, No Chemotherapy, No Diagnostics, No Glucose Monitoring, No Hemodialysis, No IV Antibiotics, No Lab Draws, No Radiation, No Surgical Procedures and No Tracheostomy  Code Status:    Code Status Orders        Start     Ordered   01/07/16 1053  Do not attempt resuscitation (DNR)  Continuous    Question Answer Comment  In the event of cardiac or respiratory ARREST Do not call a "code blue"   In the event of cardiac or respiratory ARREST Do not perform Intubation, CPR, defibrillation or ACLS   In the event of cardiac or respiratory ARREST Use medication by any  route, position, wound care, and other measures to relive pain and suffering. May use oxygen, suction and manual treatment of airway obstruction as needed for comfort.      01/07/16 1056    Code Status History    Date Active Date Inactive Code Status Order ID Comments User Context   12/25/2015  4:54 PM 12/25/2015 10:00 PM Full Code [id number]  Waldemar Dickens, MD Inpatient   12/24/2015  6:15 PM 12/24/2015  6:15 PM Full Code [id number]  Cathlyn Parsons, PA-C Inpatient   12/24/2015  6:15 PM 12/25/2015  4:54 PM Full Code [id number]  Cathlyn Parsons, PA-C Inpatient   12/14/2015  3:36 PM 12/24/2015  6:03 PM Full  Code [id number]  Radene Gunning, NP ED   06/08/2014  2:27 PM 06/10/2014 12:47 PM Full Code [id number]  Alvia Grove, PA-C Inpatient   06/01/2014  4:00 PM 06/03/2014  7:26 PM Full Code [id number]  Samella Parr, NP Inpatient    Advance Directive Documentation   Flowsheet Row Most Recent Value  Type of Advance Directive  Living will  Pre-existing out of facility DNR order (yellow form or pink MOST form)  No data  "MOST" Form in Place?  No data       Prognosis:   < 4 weeks in the setting of pancytopenia, lung mass, bone marrow failure and bilat DVT. Albumin 1.3, platelets 20. Eating only bites and sips  Discharge Planning:  Hospice facility   Thank you for allowing the Palliative Medicine Team to assist in the care of this patient.   Time In: 1100 Time Out: 1200 Total Time 60 min Prolonged Time Billed  no       Greater than 50%  of this time was spent counseling and coordinating care related to the above assessment and plan.  Dory Horn, NP  Please contact Palliative Medicine Team phone at [phone] for questions and concerns.

## 2016-01-10 NOTE — Progress Notes (Signed)
[facility]                                                                              Patient Demographics  Marcus [name], is a 75 y.o. male, DOB - 1940/10/26, [id number]  Admit date - 12/25/2015   Admitting Physician Waldemar Dickens, MD  Outpatient Primary MD for the patient is Jeanmarie Hubert, MD  Outpatient specialists:   LOS - 16  days    No chief complaint on file.      Brief summary   Marcus [name] is a 75 y.o. 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   Assessment & Plan    Principal Problem: Pancytopenia - Reviewed Dr. Antonieta Pert note and recommendations, at this point very little bone marrow functioning with respect to erythropoiesis - Discussed with palliative medicine and family on 11/9, transitioned to Full Comfort Care status. Discontinued labs, antibiotics and no further blood transfusions at this point.  - Social worker consult placed for [facility]  Pleural effusion large left  - Ultrasound-guided Thoracentesis done on 11/9, 0.73 L removed, patient feeling better.   Symptomatic anemia GI bleed Likely secondary to GI bleed secondary upper GI pathology. Suspect steroids have maintained a persistent ulcer which, with the addition of Eliquis, has been bleeding. S/p 6u PRBC since admission. EGD on 11/1 significant for nonbleeding ulcer. Last transfusion 11/7 - At this point discontinued labs and no further blood transfusions, transitioned to full comfort care  Esophageal candidiasis Seen on EGD. 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  -Patient made comfort care by palliative medicine, now focus on pain control and pain management regimen.  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 Patient afebrile. Resolved.  Code Status: dnr  DVT Prophylaxis:   SCD's Family Communication: Discussed in detail with the patient's daughter Loma Sousa at the bedside.   Disposition Plan: Residential hospice when bed available  Time Spent in minutes  15 minutes  consultants:   GI  Cardiology  Interventional Radiology  Infectious Diseases  General Surgery  Hematology  Procedures:  EGD  Mesenteric angiogram  EGD (01/01/2016)  Antimicrobials:   Vancomycin (10/25; 11/1>>11/04)  Daptomycin (11/5>>11/6)  Diflucan (11/1>>11/10  Ceftaroline (11/6>>  Cefazolin (11/16>>12/27   Medications  Scheduled Meds: . dexamethasone  2 mg Intravenous Q24H  . diltiazem  180 mg Oral Daily  . feeding supplement (PRO-STAT SUGAR FREE 64)  30 mL Oral TID BM  . fentaNYL  75 mcg Transdermal Q72H  . LORazepam  0.5 mg Oral QHS   And  .  fentaNYL (SUBLIMAZE) injection  100 mcg Intravenous QHS  . fluconazole  100 mg Oral Daily  . OLANZapine  5 mg Oral QHS  . pantoprazole  40 mg Oral BID  . senna-docusate  1 tablet Oral QHS  . sodium chloride flush  3 mL Intravenous Q12H  . timolol  1 drop Both Eyes BID   Continuous Infusions: . lactated ringers Stopped (12/29/15 0700)   PRN Meds:.acetaminophen **OR** acetaminophen, alum & mag hydroxide-simeth, feeding supplement (ENSURE ENLIVE), fentaNYL (SUBLIMAZE) injection, ipratropium-albuterol, LORazepam, magic mouthwash, nicotine polacrilex,  ondansetron (ZOFRAN) IV, promethazine **OR** promethazine **OR** promethazine, sodium chloride flush   Antibiotics   Anti-infectives    Start     Dose/Rate Route Frequency Ordered Stop   01/06/16 1500  ceftaroline (TEFLARO) 600 mg in sodium chloride 0.9 % 250 mL IVPB  Status:  Discontinued     600 mg 250 mL/hr over 60 Minutes Intravenous Every 12 hours 01/06/16 1349 01/09/16 1035   01/05/16 1115  DAPTOmycin (CUBICIN) 579.5 mg in sodium chloride 0.9 % IVPB  Status:  Discontinued     6 mg/kg  96.6 kg 223.2 mL/hr over 30 Minutes Intravenous Every 24 hours 01/05/16 1038 01/06/16 1349   01/03/16 1000  fluconazole (DIFLUCAN) tablet 100 mg     100 mg Oral Daily 01/03/16 0911 01/10/16 2359   01/02/16 1000  fluconazole (DIFLUCAN) IVPB 100 mg  Status:  Discontinued     100 mg 50 mL/hr over 60 Minutes Intravenous Every 24 hours 01/02/16 0959 01/03/16 0911   01/01/16 2200  vancomycin (VANCOCIN) 1,250 mg in sodium chloride 0.9 % 250 mL IVPB  Status:  Discontinued     1,250 mg 166.7 mL/hr over 90 Minutes Intravenous Every 12 hours 01/01/16 2147 01/05/16 1051   01/01/16 1230  fluconazole (DIFLUCAN) tablet 100 mg  Status:  Discontinued     100 mg Oral Daily 01/01/16 1124 01/02/16 0959   12/25/15 1700  vancomycin (VANCOCIN) 1,250 mg in sodium chloride 0.9 % 250 mL IVPB  Status:  Discontinued     1,250 mg 166.7 mL/hr over 90 Minutes Intravenous Every 12 hours 12/25/15 1654 12/25/15 1931        Subjective:   Marcus [name] was seen and examined today. Complaining of some pain in the periumbilical region otherwise feeling in good spirits, daughter at the bedside. Slept well most of the night.  Objective:   Vitals:   01/09/16 1220 01/09/16 1253 01/09/16 2200 01/10/16 0544  BP: (!) 79/53 100/61 (!) 98/54 (!) 91/56  Pulse:  75 74 70  Resp:    20  Temp:  98.4 F (36.9 C) 98.6 F (37 C) 98.1 F (36.7 C)  TempSrc:  Oral Oral Oral  SpO2:  91% 95% 98%  Weight:      Height:         Intake/Output Summary (Last 24 hours) at 01/10/16 1133 Last data filed at 01/10/16 0545  Gross per 24 hour  Intake                0 ml  Output             1175 ml  Net            -1175 ml     Wt Readings from Last 3 Encounters:  01/06/16 97.4 kg (214 lb 11.2 oz)  12/25/15 87 kg (191 lb 12.8 oz)  12/24/15 93.1 kg (205 lb 4.8 oz)     Exam  General: Alert and oriented x 3, NAD  HEENT:    Neck:   Cardiovascular: S1 S2 clear, RRR  Respiratory:Decreased breath sounds at the bases   Gastrointestinal: Soft, mild TTP, nondistended, + bowel sounds  Ext: no cyanosis clubbing, 2+ edema  Neuro:no new deficits   Skin:  Psych: appears anxious and depressed, alert and oriented x3    Data Reviewed:  I have personally reviewed following labs and imaging studies  Micro Results Recent Results (from the past 240 hour(s))  Culture, blood (routine x 2)     Status: None (Preliminary result)   Collection Time: 01/06/16  4:47 PM  Result Value Ref Range Status   Specimen Description BLOOD LEFT ANTECUBITAL  Final   Special Requests IN PEDIATRIC BOTTLE 3CC  Final   Culture NO GROWTH 3 DAYS  Final   Report Status PENDING  Incomplete  Culture, blood (routine x 2)     Status: None (Preliminary result)   Collection Time: 01/06/16  4:50 PM  Result Value Ref Range Status   Specimen Description BLOOD LEFT HAND  Final   Special Requests IN PEDIATRIC BOTTLE 3CC  Final   Culture NO GROWTH 3 DAYS  Final   Report Status PENDING  Incomplete  Gram stain     Status: None   Collection Time: 01/09/16 12:31 PM  Result Value Ref Range Status   Specimen Description PLEURAL LEFT  Final   Special Requests NONE  Final   Gram Stain   Final    RARE WBC PRESENT,BOTH PMN AND MONONUCLEAR NO ORGANISMS SEEN    Report Status 01/09/2016 FINAL  Final    Radiology Reports Ct Abdomen Pelvis Wo Contrast  Result Date: 12/14/2015 CLINICAL DATA:  Abdominal distention and pain. Shortness of breath.  Decreased appetite. EXAM: CT CHEST, ABDOMEN AND PELVIS WITHOUT CONTRAST TECHNIQUE: Multidetector CT imaging of the chest, abdomen and pelvis was performed following the standard protocol without IV contrast. COMPARISON:  CT abdomen pelvis 12/01/2015 and CT chest 07/07/2013. FINDINGS: CT CHEST FINDINGS Cardiovascular: Atherosclerotic calcification of the arterial vasculature, including three-vessel involvement of the coronary arteries. Heart size normal. There is pericardial calcification, indicative of prior pericarditis. No pericardial effusion. Mediastinum/Nodes: No pathologically enlarged mediastinal or axillary lymph nodes. Hilar regions are difficult to definitively evaluate without IV contrast. Esophagus is grossly unremarkable. Lungs/Pleura: Mild centrilobular emphysema. Rounded masslike consolidation in the medial aspect of the superior segment right lower lobe measures approximately 4.7 x 4.9 cm, is new from 07/07/2013, has internal ossific/calcific debris and obscures the extrapleural fat plane. Soft tissue extends around the adjacent vertebral body into the left extrapleural space. Trace right pleural effusion. Lungs are otherwise clear. Musculoskeletal: Lucency and destruction of the T7 vertebral body is seen with surrounding soft tissue. There is lysis within the anterior aspect of the T6 vertebral body as well. Airway is unremarkable. CT ABDOMEN PELVIS FINDINGS Hepatobiliary: Liver and gallbladder are unremarkable. No biliary ductal dilatation. Pancreas: Negative. Spleen: Negative. Adrenals/Urinary Tract: Adrenal glands are unremarkable. Inferior aspects of both kidneys are obscured by streak artifact from spinal hardware. Low-attenuation lesions in the left kidney measure up to 2.0 cm and are difficult to further characterize without post-contrast imaging. Ureters are decompressed. There are scattered bladder diverticulum. Stomach/Bowel: Stomach, small bowel, appendix and colon are unremarkable.  Vascular/Lymphatic: Atherosclerotic calcification of the arterial vasculature without abdominal aortic aneurysm. Postoperative changes are seen at the aortic bifurcation. No pathologically enlarged lymph nodes. Reproductive: Prostate is slightly enlarged and calcified. Other: No free fluid.  Mesenteries and peritoneum are unremarkable. Musculoskeletal: Postoperative changes in the  lumbar spine. No additional worrisome lytic or sclerotic lesions. Bone graft harvest sites in the iliac wings. IMPRESSION: 1. Masslike consolidation in the right lower lobe with invasion of the extrapleural fat, worrisome for primary bronchogenic carcinoma. Lymphoma and sarcoma are considered less likely. 2. Lytic destruction and compression of the T7 vertebral body with surrounding soft tissue, consistent with a pathologic fracture. Minimal involvement of the T6 vertebral body. 3. Trace right pleural effusion. 4. No evidence of primary malignancy or metastatic disease in the abdomen or pelvis. 5.  Aortic atherosclerosis (ICD10-170.0). Electronically Signed   By: Lorin Picket M.D.   On: 12/14/2015 14:21   Dg Chest 1 View  Result Date: 01/09/2016 CLINICAL DATA:  Status post thoracentesis EXAM: CHEST 1 VIEW COMPARISON:  Chest x-ray of January 08, 2016 FINDINGS: The patient has undergone left-sided thoracentesis. The volume of pleural fluid on the left has decreased. There is left basilar atelectasis or infiltrate. There is no pneumothorax. The right lung is clear. The heart is mildly enlarged but stable. The pulmonary vascularity is not engorged. The PICC line tip projects over the midportion of the SVC. IMPRESSION: No postprocedure complication following left-sided thoracentesis. Persistent left basilar atelectasis or pneumonia. Electronically Signed   By: David  Martinique M.D.   On: 01/09/2016 12:38   Dg Chest 2 View  Result Date: 12/17/2015 CLINICAL DATA:  Shortness of breath, left-sided chest pain EXAM: CHEST  2 VIEW COMPARISON:   CT chest of 12/14/2015, chest x-ray of 12/14/2015, and two-view chest x-ray of 11/12/2015 FINDINGS: There is abnormal opacity overlying the right hilum and suprahilar region consistent with the soft tissue mass described on recent CT of the chest. In view of the rapid development since chest x-ray of 11/12/2015, an infectious or inflammatory process would be more likely than neoplasm. No pneumonia is seen and there is a small right pleural effusion present. The heart is within upper limits normal. Partial compression of T8 and the anterior inferior aspect of T7 is noted which could also either be infectious, inflammatory, or neoplastic in origin. IMPRESSION: 1. Abnormal opacity overlies the right hilum/suprahilar region consistent with the previously described soft tissue mass involving the posterior medial right lower lobe and thoracic vertebral column. Favor infectious or inflammatory process as noted above. 2. Small right pleural effusion. Electronically Signed   By: Ivar Drape M.D.   On: 12/17/2015 11:57   Ct Head Wo Contrast  Result Date: 12/14/2015 CLINICAL DATA:  Generalized weakness, headache and photosensitivity. Decreased appetite. Fall yesterday with loss of sensation in right leg, initial encounter. EXAM: CT HEAD WITHOUT CONTRAST TECHNIQUE: Contiguous axial images were obtained from the base of the skull through the vertex without intravenous contrast. COMPARISON:  None. FINDINGS: Brain: No evidence of an acute infarct, acute hemorrhage, mass lesion, mass effect or hydrocephalus. Small area of encephalomalacia in the left occipital lobe, stable. Atrophy. Periventricular low attenuation. Vascular: No hyperdense vessel or unexpected calcification. Skull: Normal. Negative for fracture or focal lesion. Sinuses/Orbits: No acute finding. Other: None. IMPRESSION: 1. No acute intracranial abnormality. 2. Atrophy and chronic microvascular white matter ischemic changes. 3. Small area of encephalomalacia in  the left occipital lobe. Electronically Signed   By: Lorin Picket M.D.   On: 12/14/2015 10:31   Ct Chest Wo Contrast  Result Date: 12/14/2015 CLINICAL DATA:  Abdominal distention and pain. Shortness of breath. Decreased appetite. EXAM: CT CHEST, ABDOMEN AND PELVIS WITHOUT CONTRAST TECHNIQUE: Multidetector CT imaging of the chest, abdomen and pelvis was performed following the standard  protocol without IV contrast. COMPARISON:  CT abdomen pelvis 12/01/2015 and CT chest 07/07/2013. FINDINGS: CT CHEST FINDINGS Cardiovascular: Atherosclerotic calcification of the arterial vasculature, including three-vessel involvement of the coronary arteries. Heart size normal. There is pericardial calcification, indicative of prior pericarditis. No pericardial effusion. Mediastinum/Nodes: No pathologically enlarged mediastinal or axillary lymph nodes. Hilar regions are difficult to definitively evaluate without IV contrast. Esophagus is grossly unremarkable. Lungs/Pleura: Mild centrilobular emphysema. Rounded masslike consolidation in the medial aspect of the superior segment right lower lobe measures approximately 4.7 x 4.9 cm, is new from 07/07/2013, has internal ossific/calcific debris and obscures the extrapleural fat plane. Soft tissue extends around the adjacent vertebral body into the left extrapleural space. Trace right pleural effusion. Lungs are otherwise clear. Musculoskeletal: Lucency and destruction of the T7 vertebral body is seen with surrounding soft tissue. There is lysis within the anterior aspect of the T6 vertebral body as well. Airway is unremarkable. CT ABDOMEN PELVIS FINDINGS Hepatobiliary: Liver and gallbladder are unremarkable. No biliary ductal dilatation. Pancreas: Negative. Spleen: Negative. Adrenals/Urinary Tract: Adrenal glands are unremarkable. Inferior aspects of both kidneys are obscured by streak artifact from spinal hardware. Low-attenuation lesions in the left kidney measure up to 2.0 cm  and are difficult to further characterize without post-contrast imaging. Ureters are decompressed. There are scattered bladder diverticulum. Stomach/Bowel: Stomach, small bowel, appendix and colon are unremarkable. Vascular/Lymphatic: Atherosclerotic calcification of the arterial vasculature without abdominal aortic aneurysm. Postoperative changes are seen at the aortic bifurcation. No pathologically enlarged lymph nodes. Reproductive: Prostate is slightly enlarged and calcified. Other: No free fluid.  Mesenteries and peritoneum are unremarkable. Musculoskeletal: Postoperative changes in the lumbar spine. No additional worrisome lytic or sclerotic lesions. Bone graft harvest sites in the iliac wings. IMPRESSION: 1. Masslike consolidation in the right lower lobe with invasion of the extrapleural fat, worrisome for primary bronchogenic carcinoma. Lymphoma and sarcoma are considered less likely. 2. Lytic destruction and compression of the T7 vertebral body with surrounding soft tissue, consistent with a pathologic fracture. Minimal involvement of the T6 vertebral body. 3. Trace right pleural effusion. 4. No evidence of primary malignancy or metastatic disease in the abdomen or pelvis. 5.  Aortic atherosclerosis (ICD10-170.0). Electronically Signed   By: Lorin Picket M.D.   On: 12/14/2015 14:21   Ct Guided Needle Placement  Result Date: 12/18/2015 INDICATION: 75 year old male with a history of right lower lobe/posterior mediastinal mass with destruction of the T7 vertebral body. He also has new onset paraplegia. CT-guided biopsy is warranted to obtain tissue diagnosis. EXAM: CT GUIDANCE NEEDLE PLACEMENT MEDICATIONS: None. ANESTHESIA/SEDATION: Moderate (conscious) sedation was employed during this procedure. A total of Versed 2 mg and Fentanyl 100 mcg was administered intravenously. Moderate Sedation Time: 14 minutes. The patient's level of consciousness and vital signs were monitored continuously by radiology  nursing throughout the procedure under my direct supervision. FLUOROSCOPY TIME:  Fluoroscopy Time: 0 minutes 0 seconds (0 mGy). COMPLICATIONS: None immediate. PROCEDURE: Informed written consent was obtained from the patient after a thorough discussion of the procedural risks, benefits and alternatives. All questions were addressed. Maximal Sterile Barrier Technique was utilized including caps, mask, sterile gowns, sterile gloves, sterile drape, hand hygiene and skin antiseptic. A timeout was performed prior to the initiation of the procedure. A planning axial CT scan was performed. The infiltrative mass along the medial aspect of the right lower lobe directly invading the posterior mediastinum was successfully identified. A suitable skin entry site was selected and marked. The region was sterilely prepped and draped  in the standard fashion using chlorhexidine skin prep. Local anesthesia was attained by infiltration with 1% lidocaine. A small dermatotomy was made. Under intermittent CT guidance, a 17 gauge introducer needle was advanced into the margin of the soft tissue mass. Multiple 18 gauge core biopsies were then coaxially obtained using the bio Pince automated biopsy device. Biopsy specimens were placed in saline and delivered to pathology for further analysis. Additional 20 gauge needle aspirate was performed and inject into a small amount of sterile saline for cultures. Post biopsy axial CT imaging demonstrates no evidence of pneumothorax, hemorrhage or other complicating feature. IMPRESSION: Technically successful CT-guided core biopsy of right lower lobe/ posterior mediastinal mass lesion. Signed, Criselda Peaches, MD [facility] Electronically Signed   By: Jacqulynn Cadet M.D.   On: 12/18/2015 12:58   Mr Thoracic Spine Wo Contrast  Result Date: 12/26/2015 CLINICAL DATA:  Re- evaluation of known compression fracture, infection, abscess.  EXAM: MRI THORACIC SPINE WITHOUT CONTRAST TECHNIQUE: Multiplanar, multisequence MR imaging of the thoracic spine was performed. No intravenous contrast was administered. COMPARISON:  The prior MRI from 12/16/2015. FINDINGS: Examination is somewhat limited as the patient was unable to tolerate the full length of the exam. No contrast was administered on this study. A Alignment: Alignment is stable with preservation of the normal thoracic kyphosis. No listhesis. Vertebrae: Again seen is abnormal T1 hypo intense signal intensity involving the T6 and T7 vertebral bodies with associated T2/STIR hyperintensity, compatible with edema. Overall, changes are relatively similar as compared to prior study. Again, changes are contiguous with the right posterior chest wall lesion. The chest wall lesion appears overall decreased in size with decreased inflammatory changes as compared to prior MRI. This lesion measures approximately 3.2 x 2.6 cm on today's study, previously 4.4 x 4.1 cm. While no contrast was administered on this exam, the previously noted dorsal enhancing collection extending from T5 through T8-9 also appears decreased in size from previous exam, most evident on sagittal T2 weighted sequence (series 5, image 9). This collection measures up to 5 mm in maximal thickness at the level of T7-8. Associated spinal canal stenosis with mass effect on the thecal sac is improved, although remains moderate to severe at the T7 level (series 10, image 27). Thecal sac measures 8 mm in AP diameter at this level. Patchy T2 signal abnormality within the cord at T7 and T8 again seen, worrisome for myelopathy. Compression deformity involving the T7 vertebral body is relatively stable with associated approximately 50% height loss and 4 mm bony retropulsion. No other new finding within the thoracic spine. Degenerative disc bulge noted at T12-L1 without stenosis. Layering left pleural effusion noted. IMPRESSION: 1. Similar appearance of  abnormal signal intensity involving the T6 and T7 vertebral bodies with associated T7 compression fracture, with direct extension into the right lower lobe pulmonary process. Overall, inflammatory changes within the right lower lobe are improved, with decreased size of right lower lobe lesion as compared to prior MRI from 12/16/2015. Given the interval improvement, changes suggest an improving infectious process. T7 compression fracture with 50% height loss and 4 mm bony retropulsion is stable. 2. 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 3. Overall improvement in previously seen spinal canal stenosis, although there remains persistent moderate to severe stenosis at the level of T7. 4. 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 5. Layering left pleural effusion.  Electronically Signed   By: Jeannine Boga M.D.   On: 12/26/2015 23:38   Mr Thoracic Spine W Wo Contrast  Result Date: 12/16/2015 CLINICAL DATA:  Invasive thoracic mass. Bilateral lower extremity anesthesia. EXAM: MRI THORACIC SPINE WITHOUT AND WITH CONTRAST TECHNIQUE: Multiplanar and multiecho pulse sequences of the thoracic spine were obtained without and with intravenous contrast. CONTRAST:  29mL MULTIHANCE GADOBENATE DIMEGLUMINE 529 MG/ML IV SOLN COMPARISON:  Chest CT 12/14/2015 FINDINGS: There is loss of the normal T1 weighted signal within the T6 and T7 vertebral bodies with associated contrast enhancement. This is in continuity with a posterior right chest wall mass that is better characterized on the recent chest CT. On the current examination, this mass measures approximately 4.4 x 4.1 cm. There is a peripherally enhancing collection along the dorsal aspect of the spinal canal extending from the level of the T5-T6 disc to the T9 level, measuring up to 5 mm in thickness. This contributes to  severe attenuation of the thecal sac and compression of the spinal cord. There is increased T2 weighted signal within the spinal cord at the T7 and T8 levels suggesting compressive myelopathy. Additionally, there is compression deformity of the T7 body with approximately 50% height loss centrally and mild retropulsion. The other thoracic levels are unremarkable without spinal canal stenosis. IMPRESSION: 1. Abnormal signal and contrast enhancement of the T6 and T7 vertebral bodies with associated T7 compression fracture. This is likely secondary to direct extension of the process within the right lower lobe. While this may be extension of a pulmonary malignancy invading the vertebral column, an infectious process of the lung with an associated discitis-osteomyelitis is also a possibility. Given this constellation of findings, atypical infectious processes (such as tuberculosis) should be considered. 2. 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 3. 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 Critical Value/emergent results were called by telephone at the time of interpretation on 12/16/2015 at 6:53 pm to Dr. Raiford Noble , who verbally acknowledged these results. Electronically Signed   By: Ulyses Jarred M.D.   On: 12/16/2015 18:54   Ir Angiogram Visceral Selective  Result Date: 12/27/2015 CLINICAL DATA:  GI bleed. Endoscopy suggests proximal jejunal source. Known duodenal ulcer, but no active bleeding seen on recent endoscopy. EXAM: SELECTIVE VISCERAL ARTERIOGRAPHY; IR ULTRASOUND GUIDANCE VASC ACCESS RIGHT ANESTHESIA/SEDATION: Intravenous Fentanyl and Versed were administered as conscious sedation during continuous monitoring of the patient's level of  consciousness and physiological / cardiorespiratory status by the radiology RN, with a total moderate sedation time of 32 minutes. MEDICATIONS: Lidocaine 1% subcutaneous PROCEDURE: The procedure, risks (including but not limited to bleeding, infection, organ damage ), benefits, and alternatives were explained to the patient. Questions regarding the procedure were encouraged and answered. The patient understands and consents to the procedure. Right femoral region prepped and draped in usual sterile fashion. Maximal barrier sterile technique was utilized including caps, mask, sterile gowns, sterile gloves, sterile drape, hand hygiene and skin antiseptic. The right common femoral artery was localized under ultrasound. Under real-time ultrasound guidance, the vessel was accessed with a 21-gauge micropuncture needle, exchanged over a 018 guidewire for a transitional dilator, through which a 035 guidewire was advanced. Over this, a 5 [place] vascular sheath was placed, through which a 5 [place] C2  catheter was advanced and used to selectively catheterize the celiac axis for selective arteriography in multiple projections. The C2 was then utilized to selectively catheterize the superior mesenteric artery for selective arteriography in multiple projections. The C2 was then exchanged for a RIM catheter, utilized to selectively catheterize the inferior mesenteric artery for selective arteriography in 2 projections. The catheter and sheath were removed and hemostasis achieved with the aid of the Exoseal device after confirmatory femoral arteriography. The patient tolerated the procedure well. COMPLICATIONS: None immediate FINDINGS: No evidence of active extravasation, early draining vein, AVM, or other lesion to suggest a site or etiology of the patient's GI bleeding. 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 Venous phase confirms patency of the IMV and portal venous system. IMPRESSION:  1. Negative three-vessel mesenteric arteriogram. No evidence of active extravasation or other focal lesion to suggest etiology of GI bleed. Electronically Signed   By: Lucrezia Europe M.D.   On: 12/27/2015 16:56   Ir Angiogram Visceral Selective  Result Date: 12/27/2015 CLINICAL DATA:  GI bleed. Endoscopy suggests proximal jejunal source. Known duodenal ulcer, but no active bleeding seen on recent endoscopy. EXAM: SELECTIVE VISCERAL ARTERIOGRAPHY; IR ULTRASOUND GUIDANCE VASC ACCESS RIGHT ANESTHESIA/SEDATION: Intravenous Fentanyl and Versed were administered as conscious sedation during continuous monitoring of the patient's level of consciousness and physiological / cardiorespiratory status by the radiology RN, with a total moderate sedation time of 32 minutes. MEDICATIONS: Lidocaine 1% subcutaneous PROCEDURE: The procedure, risks (including but not limited to bleeding, infection, organ damage ), benefits, and alternatives were explained to the patient. Questions regarding the procedure were encouraged and answered. The patient understands and consents to the procedure. Right femoral region prepped and draped in usual sterile fashion. Maximal barrier sterile technique was utilized including caps, mask, sterile gowns, sterile gloves, sterile drape, hand hygiene and skin antiseptic. The right common femoral artery was localized under ultrasound. Under real-time ultrasound guidance, the vessel was accessed with a 21-gauge micropuncture needle, exchanged over a 018 guidewire for a transitional dilator, through which a 035 guidewire was advanced. Over this, a 5 [place] vascular sheath was placed, through which a 5 [place] C2 catheter was advanced and used to selectively catheterize the celiac axis for selective arteriography in multiple projections. The C2 was then utilized to selectively catheterize the superior mesenteric artery for selective arteriography in multiple projections. The C2 was then exchanged for a RIM  catheter, utilized to selectively catheterize the inferior mesenteric artery for selective arteriography in 2 projections. The catheter and sheath were removed and hemostasis achieved with the aid of the Exoseal device after confirmatory femoral arteriography. The patient tolerated the procedure well. COMPLICATIONS: None immediate FINDINGS: No evidence of active extravasation, early draining vein, AVM, or other lesion to suggest a site or etiology of the patient's GI bleeding. 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 Venous phase confirms patency of the IMV and portal venous system. IMPRESSION: 1. Negative three-vessel mesenteric arteriogram. No evidence of active extravasation or other focal lesion to suggest etiology of GI bleed. Electronically Signed   By: Lucrezia Europe M.D.   On: 12/27/2015 16:56   Ir Angiogram Visceral Selective  Result Date: 12/27/2015 CLINICAL DATA:  GI bleed. Endoscopy suggests proximal jejunal source. Known duodenal ulcer, but no active bleeding seen on recent endoscopy. EXAM: SELECTIVE VISCERAL ARTERIOGRAPHY; IR ULTRASOUND GUIDANCE VASC ACCESS RIGHT ANESTHESIA/SEDATION: Intravenous Fentanyl and Versed were administered as conscious sedation during continuous monitoring of  the patient's level of consciousness and physiological / cardiorespiratory status by the radiology RN, with a total moderate sedation time of 32 minutes. MEDICATIONS: Lidocaine 1% subcutaneous PROCEDURE: The procedure, risks (including but not limited to bleeding, infection, organ damage ), benefits, and alternatives were explained to the patient. Questions regarding the procedure were encouraged and answered. The patient understands and consents to the procedure. Right femoral region prepped and draped in usual sterile fashion. Maximal barrier sterile technique was utilized including caps, mask, sterile gowns, sterile gloves, sterile drape, hand hygiene and skin antiseptic.  The right common femoral artery was localized under ultrasound. Under real-time ultrasound guidance, the vessel was accessed with a 21-gauge micropuncture needle, exchanged over a 018 guidewire for a transitional dilator, through which a 035 guidewire was advanced. Over this, a 5 [place] vascular sheath was placed, through which a 5 [place] C2 catheter was advanced and used to selectively catheterize the celiac axis for selective arteriography in multiple projections. The C2 was then utilized to selectively catheterize the superior mesenteric artery for selective arteriography in multiple projections. The C2 was then exchanged for a RIM catheter, utilized to selectively catheterize the inferior mesenteric artery for selective arteriography in 2 projections. The catheter and sheath were removed and hemostasis achieved with the aid of the Exoseal device after confirmatory femoral arteriography. The patient tolerated the procedure well. COMPLICATIONS: None immediate FINDINGS: No evidence of active extravasation, early draining vein, AVM, or other lesion to suggest a site or etiology of the patient's GI bleeding. 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 Venous phase confirms patency of the IMV and portal venous system. IMPRESSION: 1. Negative three-vessel mesenteric arteriogram. No evidence of active extravasation or other focal lesion to suggest etiology of GI bleed. Electronically Signed   By: Lucrezia Europe M.D.   On: 12/27/2015 16:56   US Abdomen Limited  Result Date: 01/06/2016 CLINICAL DATA:  Left upper quadrant pain EXAM: LIMITED ABDOMINAL ULTRASOUND COMPARISON:  CT 12/14/2015 FINDINGS: Limited ultrasound of left upper quadrant performed for evaluation of the spleen. The spleen measures 4.9 x 9.8 x 4.5 cm with a volume of 111.6 cubic cm. Incidentally noted is a large left-sided pleural effusion. IMPRESSION: 1. The spleen is within normal limits 2. Large left-sided pleural  effusion Electronically Signed   By: Donavan Foil M.D.   On: 01/06/2016 16:47   Ir US Guide Vasc Access Right  Result Date: 12/27/2015 CLINICAL DATA:  GI bleed. Endoscopy suggests proximal jejunal source. Known duodenal ulcer, but no active bleeding seen on recent endoscopy. EXAM: SELECTIVE VISCERAL ARTERIOGRAPHY; IR ULTRASOUND GUIDANCE VASC ACCESS RIGHT ANESTHESIA/SEDATION: Intravenous Fentanyl and Versed were administered as conscious sedation during continuous monitoring of the patient's level of consciousness and physiological / cardiorespiratory status by the radiology RN, with a total moderate sedation time of 32 minutes. MEDICATIONS: Lidocaine 1% subcutaneous PROCEDURE: The procedure, risks (including but not limited to bleeding, infection, organ damage ), benefits, and alternatives were explained to the patient. Questions regarding the procedure were encouraged and answered. The patient understands and consents to the procedure. Right femoral region prepped and draped in usual sterile fashion. Maximal barrier sterile technique was utilized including caps, mask, sterile gowns, sterile gloves, sterile drape, hand hygiene and skin antiseptic. The right common femoral artery was localized under ultrasound. Under real-time ultrasound guidance, the vessel was accessed with a 21-gauge micropuncture needle, exchanged over a 018 guidewire for a transitional dilator, through which a 035 guidewire was advanced. Over this,  a 5 [place] vascular sheath was placed, through which a 5 [place] C2 catheter was advanced and used to selectively catheterize the celiac axis for selective arteriography in multiple projections. The C2 was then utilized to selectively catheterize the superior mesenteric artery for selective arteriography in multiple projections. The C2 was then exchanged for a RIM catheter, utilized to selectively catheterize the inferior mesenteric artery for selective arteriography in 2 projections. The  catheter and sheath were removed and hemostasis achieved with the aid of the Exoseal device after confirmatory femoral arteriography. The patient tolerated the procedure well. COMPLICATIONS: None immediate FINDINGS: No evidence of active extravasation, early draining vein, AVM, or other lesion to suggest a site or etiology of the patient's GI bleeding. 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 Venous phase confirms patency of the IMV and portal venous system. IMPRESSION: 1. Negative three-vessel mesenteric arteriogram. No evidence of active extravasation or other focal lesion to suggest etiology of GI bleed. Electronically Signed   By: Lucrezia Europe M.D.   On: 12/27/2015 16:56   Dg Chest Port 1 View  Result Date: 01/08/2016 CLINICAL DATA:  Shortness of breath, back pain. EXAM: PORTABLE CHEST 1 VIEW COMPARISON:  Portable chest x-ray of December 27, 2015 FINDINGS: The right lung is well-expanded and clear. On the left the retrocardiac region is dense. There is pleural fluid layering posteriorly and laterally. The heart and pulmonary vascularity are normal. The mediastinum is normal in width. There is calcification in the wall of the aortic arch. The PICC line tip projects just above the cavoatrial junction. The bony thorax is unremarkable where visualized. IMPRESSION: Moderate-sized pleural effusion on the left layering posteriorly. Left lower lobe atelectasis or pneumonia. Mild cardiomegaly without pulmonary vascular congestion. Aortic atherosclerosis. Electronically Signed   By: David  Martinique M.D.   On: 01/08/2016 12:16   Dg Chest Port 1 View  Result Date: 12/27/2015 CLINICAL DATA:  Shortness of breath.  Post EGD for GI bleed. EXAM: PORTABLE CHEST 1 VIEW COMPARISON:  12/17/2015 and CT chest 12/14/2015 FINDINGS: Interval placement of right-sided PICC line with tip over the SVC. Lungs are adequately inflated with moderate interval improvement in the airspace process over  the medial superior segment right lower lobe. No effusion. Cardiomediastinal silhouette is within normal. There is minimal calcified plaque over the aortic arch. Remainder of the exam is unchanged. IMPRESSION: Interval improvement in airspace process over the medial aspect superior segment right lower lobe likely resolving pneumonia. Right-sided PICC line with tip over the SVC. Aortic atherosclerosis. Electronically Signed   By: Marin Olp M.D.   On: 12/27/2015 14:02   Dg Chest Port 1 View  Result Date: 12/14/2015 CLINICAL DATA:  75 year old male with a history of chest pain EXAM: PORTABLE CHEST 1 VIEW COMPARISON:  11/28/2015 FINDINGS: Double density in the right hilum, new from the comparison. No pleural effusion or pneumothorax. No confluent airspace disease. No displaced fracture. IMPRESSION: Double density in the right hilum, new from the comparison plain film in September. Given the rapid development, this may reflect a developing infection in the medial lung, however, contrast-enhanced CT is recommended to rule out adenopathy or mass. Signed, Dulcy Fanny. Earleen Newport, DO [facility] Electronically Signed   By: Corrie Mckusick D.O.   On: 12/14/2015 09:28   US Thoracentesis Asp Pleural Space W/img Guide  Result Date: 01/09/2016 INDICATION: Left pleural effusion. 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 EXAM:  ULTRASOUND GUIDED DIAGNOSTIC AND THERAPEUTIC THORACENTESIS MEDICATIONS: 1% lidocaine. COMPLICATIONS: None immediate. PROCEDURE: An ultrasound guided thoracentesis was thoroughly discussed with the patient and questions answered. The benefits, risks, alternatives and complications were also discussed. The patient understands and wishes to proceed with the procedure. Written consent was obtained. Ultrasound was performed to localize and mark an adequate pocket of fluid in the left chest. The area was  then prepped and draped in the normal sterile fashion. 1% Lidocaine was used for local anesthesia. Under ultrasound guidance a 19 gauge, 7-cm, Yueh catheter was introduced. Thoracentesis was performed. The catheter was removed and a dressing applied. FINDINGS: A total of approximately 0.73 L of clear light yellow fluid was removed. Samples were sent to the laboratory as requested by the clinical team. IMPRESSION: Successful ultrasound guided left thoracentesis yielding 0.73 L of pleural fluid. Read by: Saverio Danker, PA-C Electronically Signed   By: Aletta Edouard M.D.   On: 01/09/2016 12:58    Lab Data:  CBC:  Recent Labs Lab 01/04/16 1457  01/06/16 0425 01/07/16 0319 01/07/16 0930 01/08/16 1100 01/08/16 2241 01/09/16 0915  WBC 1.8*  < > 1.8* 0.8*  --  1.1* 1.9* 2.3*  NEUTROABS 1.6*  --   --   --   --   --   --   --   HGB 6.4*  < > 7.7* 6.9* 8.5* 7.4* 6.5* 7.4*  HCT 19.6*  < > 23.1* 20.5* 24.9* 21.8* 19.2* 22.3*  MCV 94.2  < > 90.6 90.3  --  89.3 90.6 89.6  PLT 46*  < > 33* 25*  --  24* 22* 20*  < > = values in this interval not displayed. Basic Metabolic Panel:  Recent Labs Lab 01/04/16 0915 01/07/16 0319 01/09/16 0915  NA 140 137 138  K 3.8 3.6 3.7  CL 111 109 111  CO2 24 22 24   GLUCOSE 109* 116* 92  BUN 22* 23* 25*  CREATININE 0.50* 0.53* 0.62  CALCIUM 7.4* 7.0* 6.8*   GFR: Estimated Creatinine Clearance: 92.8 mL/min (by C-G formula based on SCr of 0.62 mg/dL). Liver Function Tests:  Recent Labs Lab 01/09/16 0915  AST 47*  ALT 113*  ALKPHOS 116  BILITOT 0.5  PROT 3.4*  ALBUMIN 1.3*   No results for input(s): LIPASE, AMYLASE in the last 168 hours.  Recent Labs Lab 01/04/16 0915  AMMONIA 22   Coagulation Profile:  Recent Labs Lab 01/07/16 0319  INR 1.08   Cardiac Enzymes:  Recent Labs Lab 01/05/16 1845  CKTOTAL 84   BNP (last 3 results) No results for input(s): PROBNP in the last 8760 hours. HbA1C: No results for input(s): HGBA1C in the  last 72 hours. CBG:  Recent Labs Lab 01/06/16 0734 01/06/16 1146 01/06/16 1640 01/07/16 0815 01/07/16 1122  GLUCAP 79 89 127* 141* 126*   Lipid Profile: No results for input(s): CHOL, HDL, LDLCALC, TRIG, CHOLHDL, LDLDIRECT in the last 72 hours. Thyroid Function Tests: No results for input(s): TSH, T4TOTAL, FREET4, T3FREE, THYROIDAB in the last 72 hours. Anemia Panel:  Recent Labs  01/07/16 1435  RETICCTPCT 0.5   Urine analysis:    Component Value Date/Time   COLORURINE YELLOW 12/26/2015 [place] 12/26/2015 1515   APPEARANCEUR Clear 12/20/2014 1041   LABSPEC 1.025 12/26/2015 1515   PHURINE 6.0 12/26/2015 1515   GLUCOSEU NEGATIVE 12/26/2015 1515   HGBUR SMALL (A) 12/26/2015 [place] 12/26/2015 1515   BILIRUBINUR Negative 12/20/2014 1041   KETONESUR  NEGATIVE 12/26/2015 1515   PROTEINUR NEGATIVE 12/26/2015 1515   UROBILINOGEN 0.2 06/06/2014 1332   NITRITE NEGATIVE 12/26/2015 1515   LEUKOCYTESUR NEGATIVE 12/26/2015 1515   LEUKOCYTESUR 3+ (A) 12/20/2014 1041     Manjit Bufano M.D. [facility] 01/10/2016, 11:33 AM  Pager: [phone] Between 7am to 7pm - call Pager - [phone]  After 7pm go to [email or web address] - password [email or web address]  Call night coverage person covering after 7pm

## 2016-01-10 NOTE — Progress Notes (Signed)
[facility] RN Liaison Visit  Visit with patient, his wife, and daughter Kathlyn [place].  Reviewed [employer] and all in agreement for transfer today. Spoke with Marshell Levan SW who will arrange transport.  Please send patient's DNR and call report to [phone].  Thank you for the opportunity to serve this patient and family.  Moss Mc, RN , BSN, [facility] Director of Inpatient Services  Please feel free to call with any questions/needs. [facility] liaisons can now be found on [place] or you may call the main number at [phone].  I can be reached today at [phone].

## 2016-01-10 NOTE — Progress Notes (Signed)
Notified CSW of recommendation from Palliative Medicine Team to discharge to inpatient residential hospice facility, preferably [facility].    Reinaldo Raddle, RN, BSN  Trauma/Neuro ICU Case Manager [phone]

## 2016-01-11 LAB — CULTURE, BLOOD (ROUTINE X 2)
CULTURE: NO GROWTH
Culture: NO GROWTH

## 2016-01-13 ENCOUNTER — Telehealth: Payer: Self-pay

## 2016-01-13 NOTE — Telephone Encounter (Signed)
Dr. Nyoka Cowden,  This patient came across my TOC F/U list, however, upon some investigating it looks as though he has been transferred over to [facility] and placed on comfort care only. Wanted to give you a heads up on him. Thank you. Delrae Rend

## 2016-01-14 LAB — CULTURE, BODY FLUID-BOTTLE: CULTURE: NO GROWTH

## 2016-01-14 LAB — CULTURE, BODY FLUID W GRAM STAIN -BOTTLE

## 2016-01-16 LAB — FUNGAL ORGANISM REFLEX

## 2016-01-16 LAB — FUNGUS CULTURE WITH STAIN

## 2016-01-16 LAB — FUNGUS CULTURE RESULT

## 2016-01-30 LAB — ACID FAST CULTURE WITH REFLEXED SENSITIVITIES (MYCOBACTERIA): Acid Fast Culture: NEGATIVE

## 2016-01-30 LAB — ACID FAST CULTURE WITH REFLEXED SENSITIVITIES

## 2016-01-31 DEATH — deceased

## 2016-03-20 ENCOUNTER — Other Ambulatory Visit: Payer: Medicare Other

## 2016-03-24 ENCOUNTER — Ambulatory Visit: Payer: Medicare Other | Admitting: Internal Medicine

## 2016-04-02 ENCOUNTER — Encounter: Payer: Self-pay | Admitting: Cardiology

## 2016-07-14 ENCOUNTER — Ambulatory Visit: Payer: Medicare Other | Admitting: Family

## 2016-07-14 ENCOUNTER — Encounter (HOSPITAL_COMMUNITY): Payer: Medicare Other

## 2018-05-27 IMAGING — DX DG RIBS W/ CHEST 3+V*L*
3 series · 3 of 3 positions shown · non-contrast
Comparison: 11/12/2015

CLINICAL DATA: Posterior thoracic tenderness over the ribs.
Evaluate for fracture.

EXAM:
LEFT RIBS AND CHEST - 3+ VIEW

[chest pa]
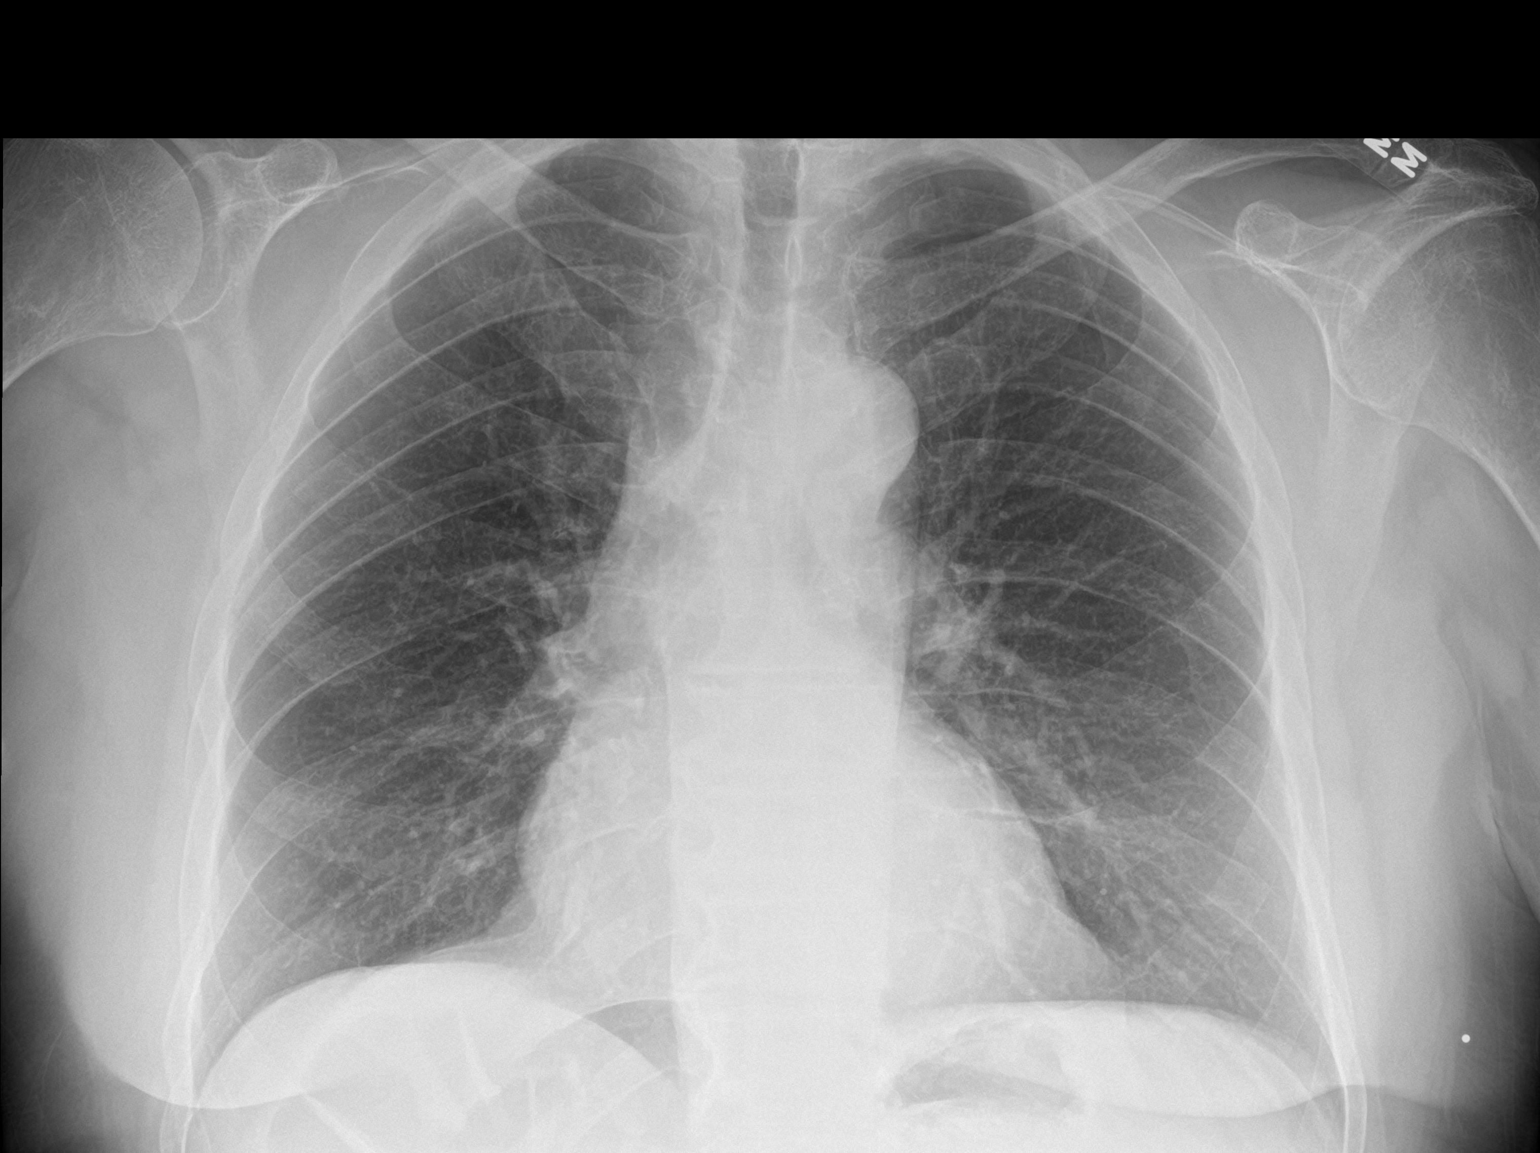

[rib obl]
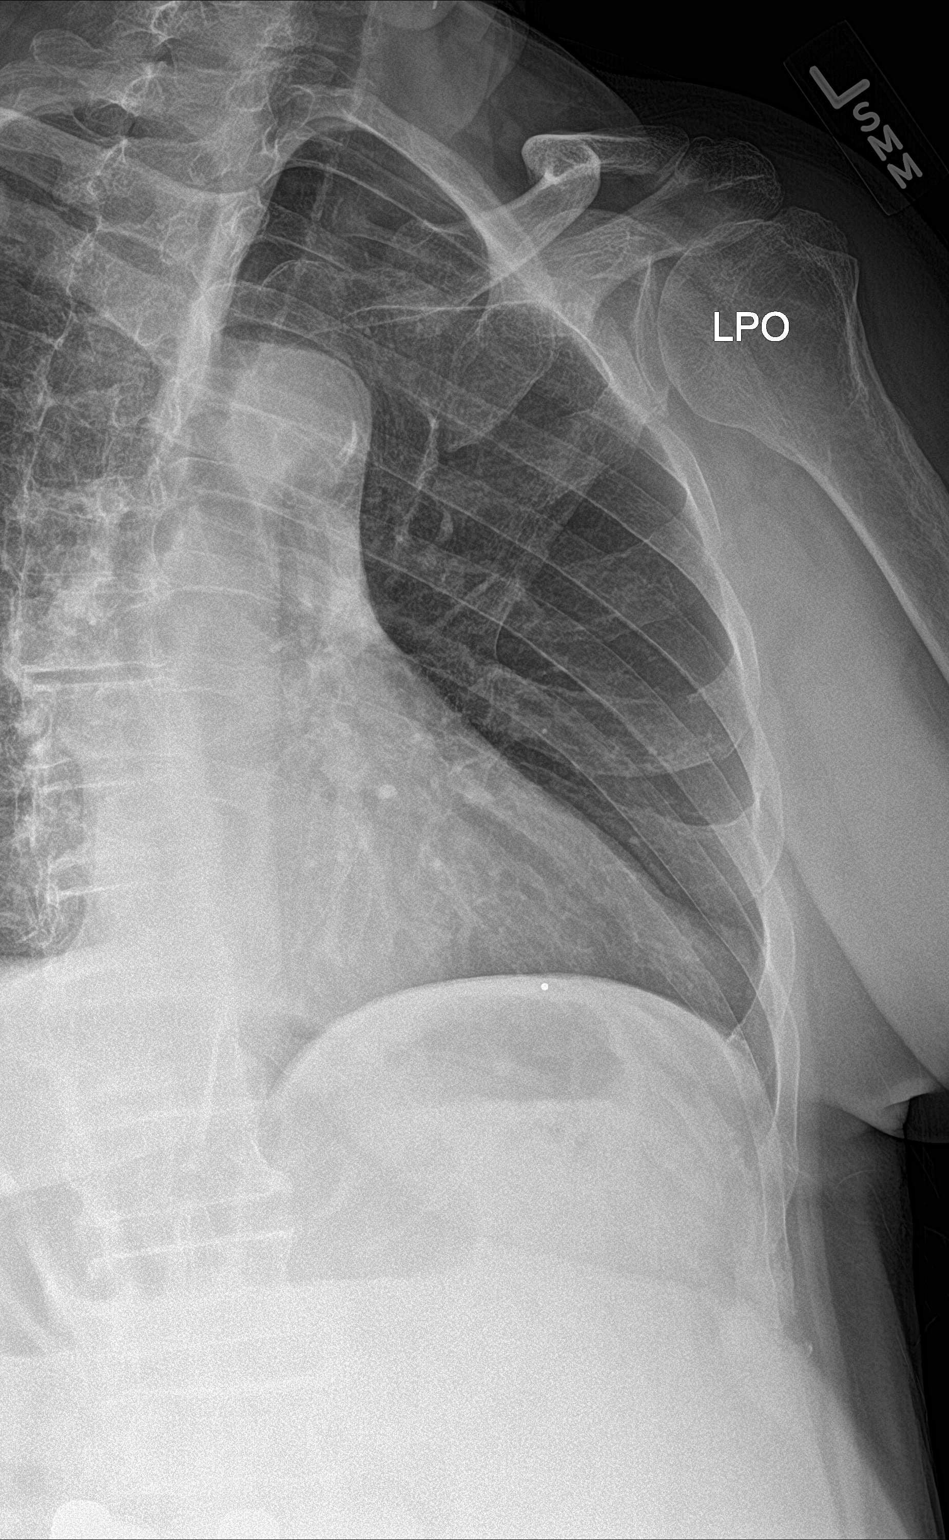

[rib pa]
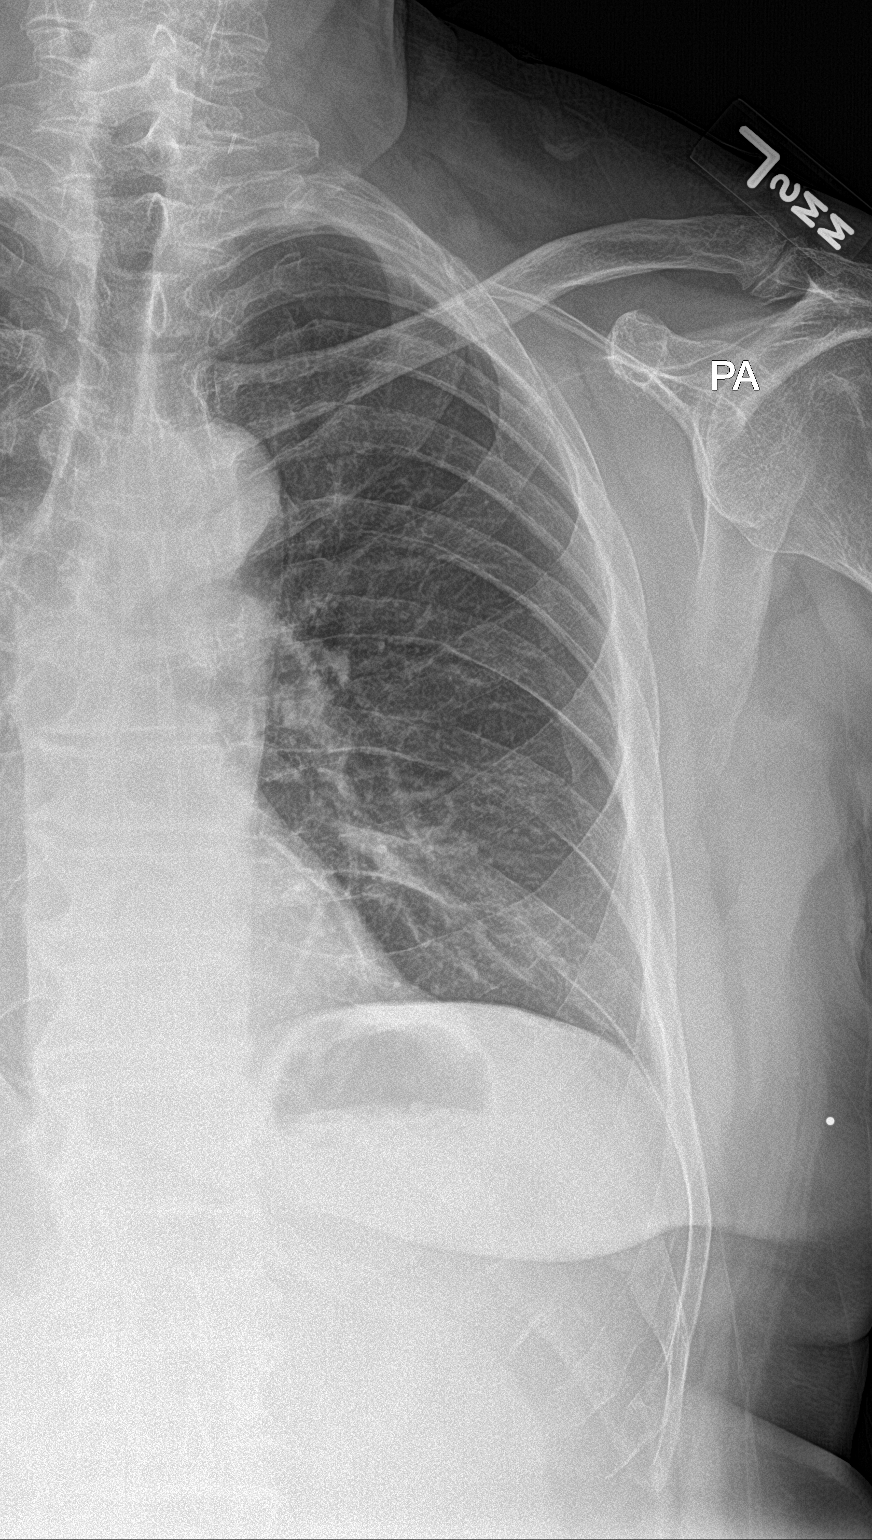

[3 of 3 positions shown; findings below may reference images not displayed]

FINDINGS: No fracture or other bone lesions are seen involving the ribs. There
is no evidence of pneumothorax or pleural effusion. Both lungs are
clear. Heart size and mediastinal contours are within normal limits.
IMPRESSION: Negative.

## 2018-05-30 IMAGING — CT CT RENAL STONE PROTOCOL
2 of 3 series · 10 of 46 positions shown, 11 images · non-contrast
Comparison: 11/19/2015

CLINICAL DATA: Swelling to both legs and the abdomen. Pain in the
back radiating to the left flank and lower abdomen. History of
urinary tract infection. Hematuria.

EXAM:
CT ABDOMEN AND PELVIS WITHOUT CONTRAST
TECHNIQUE: Multidetector CT imaging of the abdomen and pelvis was performed
following the standard protocol without IV contrast.

[Series 201: stone study, idose (2) · axial · 0.86mm/px · z∈[-10,+345]mm · 7 of 89 slices shown, 8 images]
[im 9/89  soft-tissue]
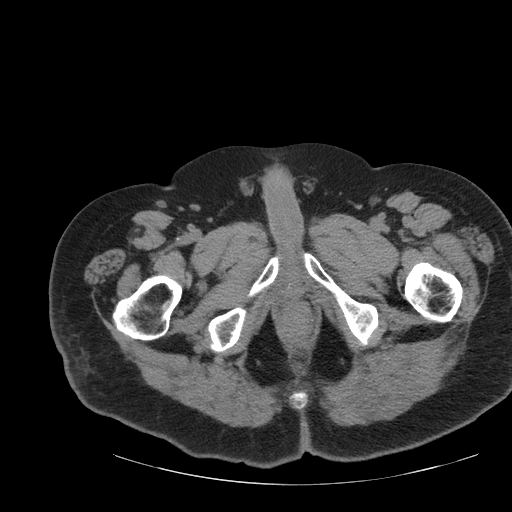
[im 9/89  bone]
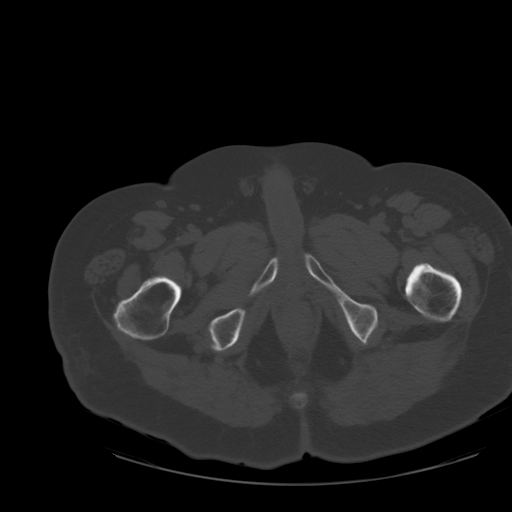
[im 20/89  soft-tissue]
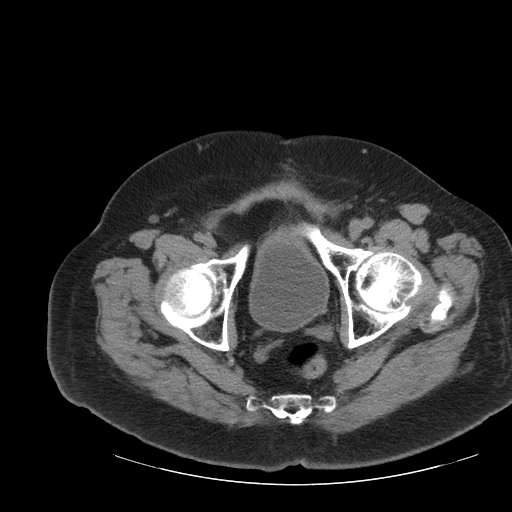
[im 32/89  soft-tissue]
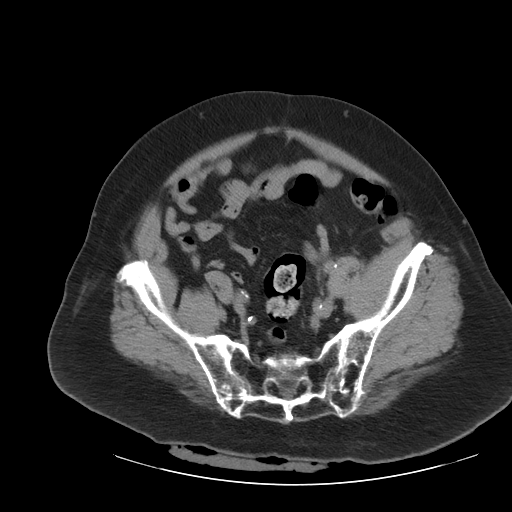
[im 46/89  soft-tissue]
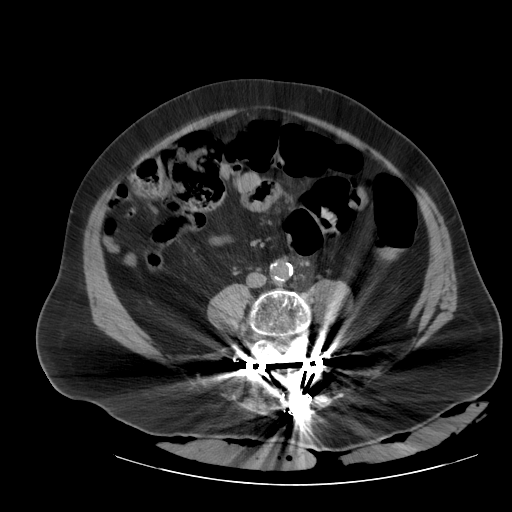
[im 57/89  soft-tissue]
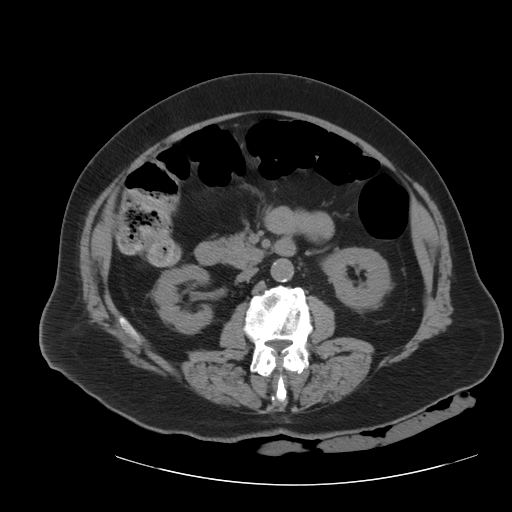
[im 69/89  soft-tissue]
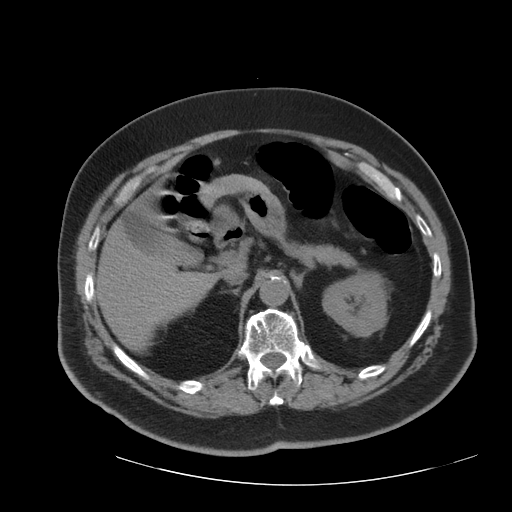
[im 80/89  soft-tissue]
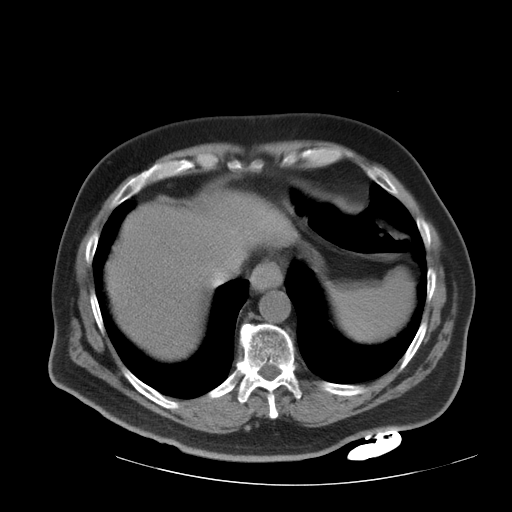

[Series 203: coronals, idose (2) · coronal · 0.45mm/px · 3 of 149 slices shown]
[im 50/149  soft-tissue]
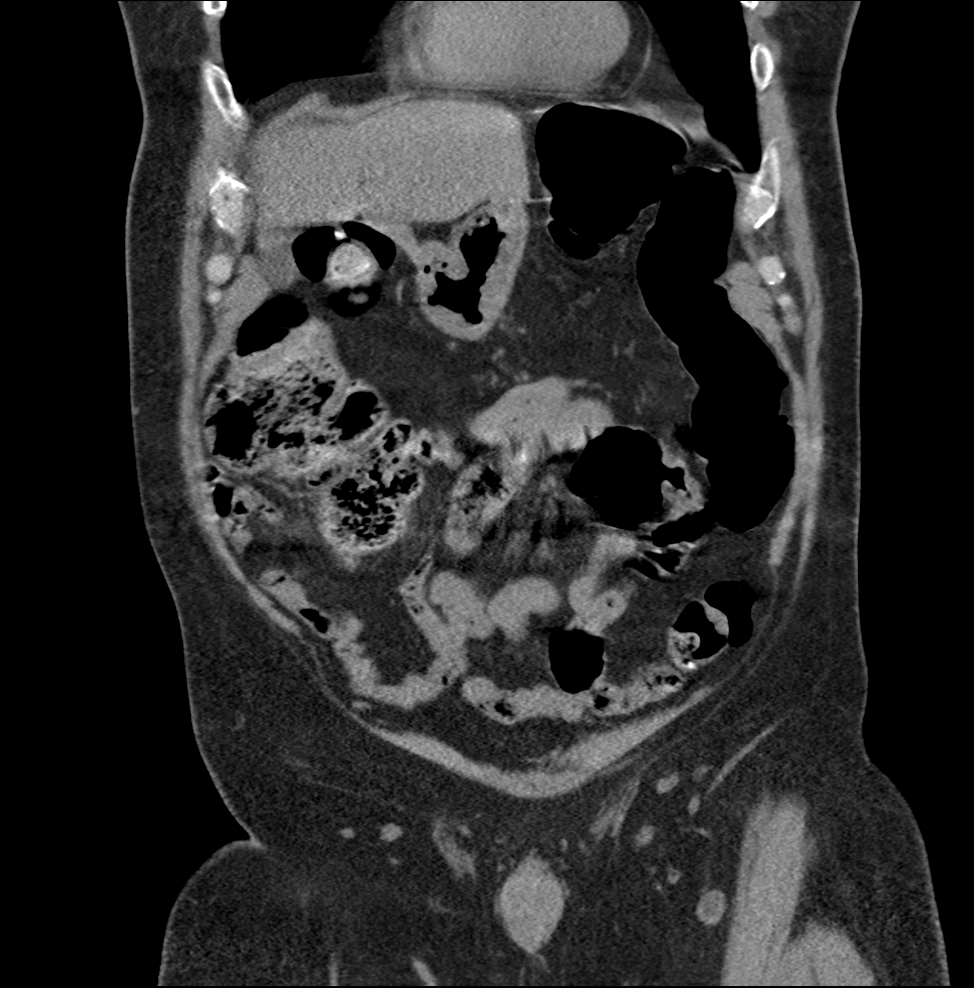
[im 66/149  soft-tissue]
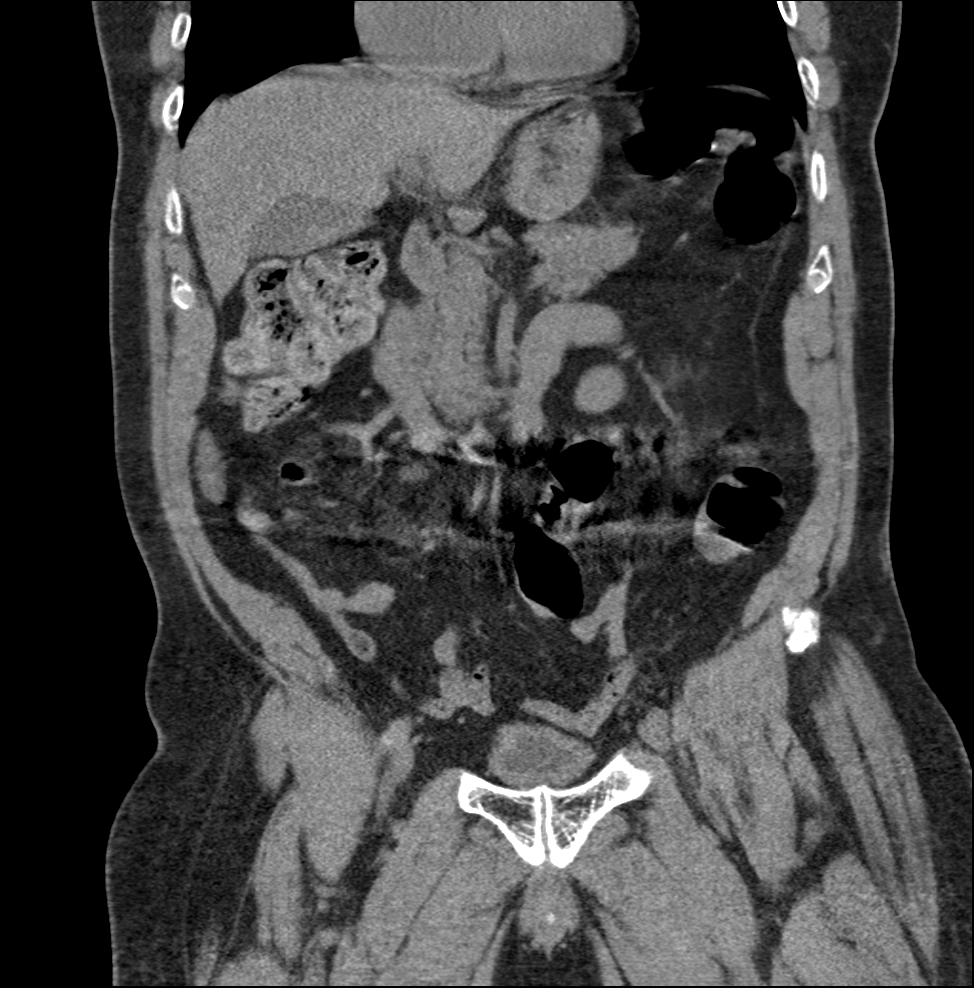
[im 83/149  soft-tissue]
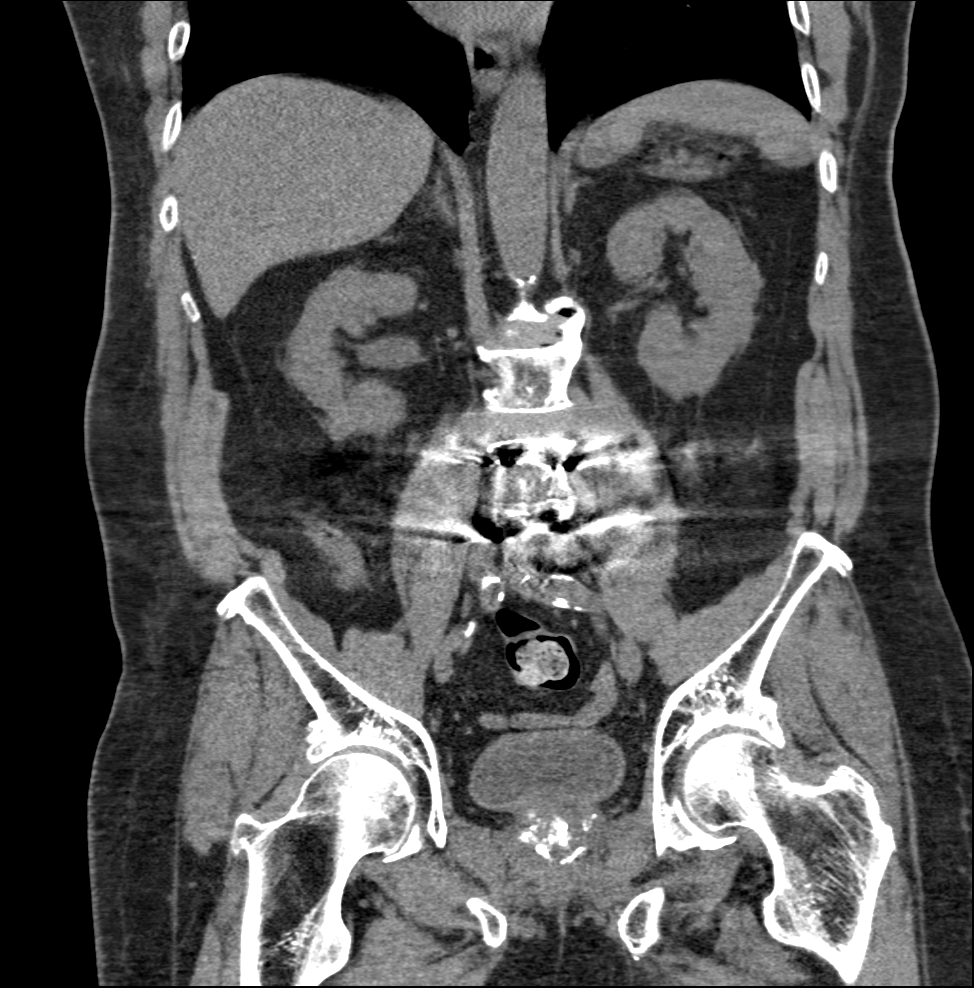

[10 of 46 positions shown; findings below may reference images not displayed]

FINDINGS: Lower chest: Calcified granuloma in the right lung base. Mild
pericardial calcification. Small esophageal hiatal hernia.

Hepatobiliary: Noncontrast appearance is unremarkable.

Pancreas: Noncontrast appearance is unremarkable.

Spleen: Noncontrast appearance is unremarkable.

Adrenals/Urinary Tract: No adrenal gland nodules. Scarring or
atrophy of the kidneys, more prominent on the right. Small cyst in
the anterior left kidney. Stones in the lower pole right kidney. No
hydronephrosis or hydroureter. No ureteral stones or bladder stones.
No bladder wall thickening.

Stomach/Bowel: Stomach and small bowel are decompressed. Scattered
stool and gas in the colon without abnormal distention. Scattered
colonic diverticula. No evidence of diverticulitis. Appendix is
normal.

Vascular/Lymphatic: Aortic atherosclerosis. No enlarged abdominal or
pelvic lymph nodes.

Reproductive: Prostate gland is enlarged, measuring 5.1 cm diameter.
Prostate calcifications.

Other: No abdominal wall hernia or abnormality. No abdominopelvic
ascites.

Musculoskeletal: Degenerative changes in the spine and hips.
Postoperative changes with posterior fixation from L4 to the sacrum.
Persistent moderate anterior subluxation of L5 on the sacrum. No
change in the appearance of the spine since previous study.
IMPRESSION: Nonobstructing stone in the right kidney. No hydronephrosis or
hydroureter. Prostate gland is enlarged. No evidence of bowel
obstruction or inflammation.
